# Patient Record
Sex: Female | Born: 1935
Health system: Southern US, Community
[De-identification: ages and names within clinical notes are randomized; demographics above are authoritative.]

## PROBLEM LIST (undated history)

## (undated) DIAGNOSIS — I480 Paroxysmal atrial fibrillation: Secondary | ICD-10-CM

## (undated) DIAGNOSIS — S42302A Unspecified fracture of shaft of humerus, left arm, initial encounter for closed fracture: Secondary | ICD-10-CM

## (undated) DIAGNOSIS — R609 Edema, unspecified: Secondary | ICD-10-CM

## (undated) DIAGNOSIS — E785 Hyperlipidemia, unspecified: Secondary | ICD-10-CM

## (undated) DIAGNOSIS — D1730 Benign lipomatous neoplasm of skin and subcutaneous tissue of unspecified sites: Secondary | ICD-10-CM

## (undated) DIAGNOSIS — N183 Chronic kidney disease, stage 3 unspecified: Secondary | ICD-10-CM

## (undated) DIAGNOSIS — M8580 Other specified disorders of bone density and structure, unspecified site: Secondary | ICD-10-CM

## (undated) DIAGNOSIS — C801 Malignant (primary) neoplasm, unspecified: Secondary | ICD-10-CM

## (undated) DIAGNOSIS — I4891 Unspecified atrial fibrillation: Secondary | ICD-10-CM

## (undated) DIAGNOSIS — Z9289 Personal history of other medical treatment: Secondary | ICD-10-CM

## (undated) DIAGNOSIS — E559 Vitamin D deficiency, unspecified: Secondary | ICD-10-CM

## (undated) DIAGNOSIS — I10 Essential (primary) hypertension: Secondary | ICD-10-CM

## (undated) DIAGNOSIS — I5032 Chronic diastolic (congestive) heart failure: Secondary | ICD-10-CM

## (undated) HISTORY — DX: Hyperlipidemia, unspecified: E78.5

## (undated) HISTORY — DX: Chronic diastolic (congestive) heart failure: I50.32

## (undated) HISTORY — DX: Personal history of other medical treatment: Z92.89

## (undated) HISTORY — DX: Benign lipomatous neoplasm of skin and subcutaneous tissue of unspecified sites: D17.30

## (undated) HISTORY — DX: Malignant (primary) neoplasm, unspecified: C80.1

## (undated) HISTORY — DX: Essential (primary) hypertension: I10

## (undated) HISTORY — DX: Edema, unspecified: R60.9

## (undated) HISTORY — DX: Unspecified atrial fibrillation: I48.91

## (undated) HISTORY — DX: Paroxysmal atrial fibrillation: I48.0

## (undated) HISTORY — DX: Chronic kidney disease, stage 3 unspecified: N18.30

## (undated) HISTORY — DX: Other specified disorders of bone density and structure, unspecified site: M85.80

## (undated) HISTORY — DX: Unspecified fracture of shaft of humerus, left arm, initial encounter for closed fracture: S42.302A

---
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
## 2000-06-12 ENCOUNTER — Encounter: Admission: RE | Admit: 2000-06-12 | Discharge: 2000-06-12 | Payer: Self-pay | Admitting: Internal Medicine

## 2000-06-12 ENCOUNTER — Encounter: Payer: Self-pay | Admitting: Internal Medicine

## 2001-02-14 ENCOUNTER — Encounter: Payer: Self-pay | Admitting: Internal Medicine

## 2001-02-14 ENCOUNTER — Encounter: Admission: RE | Admit: 2001-02-14 | Discharge: 2001-02-14 | Payer: Self-pay | Admitting: Internal Medicine

## 2001-07-24 ENCOUNTER — Encounter: Payer: Self-pay | Admitting: Internal Medicine

## 2001-07-24 ENCOUNTER — Encounter: Admission: RE | Admit: 2001-07-24 | Discharge: 2001-07-24 | Payer: Self-pay | Admitting: Internal Medicine

## 2002-07-15 ENCOUNTER — Encounter: Admission: RE | Admit: 2002-07-15 | Discharge: 2002-07-15 | Payer: Self-pay | Admitting: Internal Medicine

## 2002-07-15 ENCOUNTER — Encounter: Payer: Self-pay | Admitting: Internal Medicine

## 2003-08-06 ENCOUNTER — Encounter: Admission: RE | Admit: 2003-08-06 | Discharge: 2003-08-06 | Payer: Self-pay | Admitting: Internal Medicine

## 2003-08-06 ENCOUNTER — Encounter: Payer: Self-pay | Admitting: Internal Medicine

## 2003-08-19 ENCOUNTER — Other Ambulatory Visit: Admission: RE | Admit: 2003-08-19 | Discharge: 2003-08-19 | Payer: Self-pay | Admitting: Internal Medicine

## 2004-09-13 ENCOUNTER — Encounter: Admission: RE | Admit: 2004-09-13 | Discharge: 2004-09-13 | Payer: Self-pay | Admitting: Internal Medicine

## 2004-11-09 ENCOUNTER — Ambulatory Visit (HOSPITAL_COMMUNITY): Admission: RE | Admit: 2004-11-09 | Discharge: 2004-11-09 | Payer: Self-pay | Admitting: Gastroenterology

## 2005-09-14 ENCOUNTER — Encounter: Admission: RE | Admit: 2005-09-14 | Discharge: 2005-09-14 | Payer: Self-pay | Admitting: Internal Medicine

## 2006-08-22 ENCOUNTER — Other Ambulatory Visit: Admission: RE | Admit: 2006-08-22 | Discharge: 2006-08-22 | Payer: Self-pay | Admitting: Internal Medicine

## 2006-09-19 ENCOUNTER — Encounter: Admission: RE | Admit: 2006-09-19 | Discharge: 2006-09-19 | Payer: Self-pay | Admitting: Internal Medicine

## 2006-12-12 HISTORY — PX: COLONOSCOPY: SHX174

## 2007-09-13 ENCOUNTER — Other Ambulatory Visit: Admission: RE | Admit: 2007-09-13 | Discharge: 2007-09-13 | Payer: Self-pay | Admitting: Internal Medicine

## 2007-10-02 ENCOUNTER — Encounter: Admission: RE | Admit: 2007-10-02 | Discharge: 2007-10-02 | Payer: Self-pay | Admitting: Internal Medicine

## 2008-10-02 ENCOUNTER — Encounter: Admission: RE | Admit: 2008-10-02 | Discharge: 2008-10-02 | Payer: Self-pay | Admitting: Internal Medicine

## 2009-06-29 ENCOUNTER — Emergency Department (HOSPITAL_COMMUNITY): Admission: EM | Admit: 2009-06-29 | Discharge: 2009-06-29 | Payer: Self-pay | Admitting: Emergency Medicine

## 2009-12-29 ENCOUNTER — Encounter: Admission: RE | Admit: 2009-12-29 | Discharge: 2009-12-29 | Payer: Self-pay | Admitting: Internal Medicine

## 2011-01-26 ENCOUNTER — Other Ambulatory Visit: Payer: Self-pay | Admitting: Internal Medicine

## 2011-01-26 DIAGNOSIS — Z1231 Encounter for screening mammogram for malignant neoplasm of breast: Secondary | ICD-10-CM

## 2011-02-03 ENCOUNTER — Ambulatory Visit
Admission: RE | Admit: 2011-02-03 | Discharge: 2011-02-03 | Disposition: A | Discharge status: Home / Self Care | Payer: Medicare Other | Source: Ambulatory Visit | Attending: Internal Medicine | Admitting: Internal Medicine

## 2011-02-03 DIAGNOSIS — Z1231 Encounter for screening mammogram for malignant neoplasm of breast: Secondary | ICD-10-CM

## 2012-03-15 ENCOUNTER — Other Ambulatory Visit: Payer: Self-pay | Admitting: Internal Medicine

## 2012-03-15 DIAGNOSIS — Z1231 Encounter for screening mammogram for malignant neoplasm of breast: Secondary | ICD-10-CM

## 2012-03-30 DIAGNOSIS — I10 Essential (primary) hypertension: Secondary | ICD-10-CM | POA: Diagnosis not present

## 2012-03-30 DIAGNOSIS — E782 Mixed hyperlipidemia: Secondary | ICD-10-CM | POA: Diagnosis not present

## 2012-03-30 DIAGNOSIS — B351 Tinea unguium: Secondary | ICD-10-CM | POA: Diagnosis not present

## 2012-04-12 ENCOUNTER — Ambulatory Visit
Admission: RE | Admit: 2012-04-12 | Discharge: 2012-04-12 | Disposition: A | Discharge status: Home / Self Care | Payer: Medicare Other | Source: Ambulatory Visit | Attending: Internal Medicine | Admitting: Internal Medicine

## 2012-04-12 DIAGNOSIS — Z1231 Encounter for screening mammogram for malignant neoplasm of breast: Secondary | ICD-10-CM

## 2012-08-02 DIAGNOSIS — D239 Other benign neoplasm of skin, unspecified: Secondary | ICD-10-CM | POA: Diagnosis not present

## 2012-08-02 DIAGNOSIS — L821 Other seborrheic keratosis: Secondary | ICD-10-CM | POA: Diagnosis not present

## 2012-08-02 DIAGNOSIS — D234 Other benign neoplasm of skin of scalp and neck: Secondary | ICD-10-CM | POA: Diagnosis not present

## 2012-08-02 DIAGNOSIS — D485 Neoplasm of uncertain behavior of skin: Secondary | ICD-10-CM | POA: Diagnosis not present

## 2012-10-08 ENCOUNTER — Other Ambulatory Visit (HOSPITAL_COMMUNITY)
Admission: RE | Admit: 2012-10-08 | Discharge: 2012-10-08 | Disposition: A | Discharge status: Home / Self Care | Payer: Medicare Other | Source: Ambulatory Visit | Attending: Geriatric Medicine | Admitting: Geriatric Medicine

## 2012-10-08 DIAGNOSIS — Z124 Encounter for screening for malignant neoplasm of cervix: Secondary | ICD-10-CM | POA: Insufficient documentation

## 2012-10-08 DIAGNOSIS — N926 Irregular menstruation, unspecified: Secondary | ICD-10-CM | POA: Diagnosis not present

## 2012-10-08 DIAGNOSIS — N939 Abnormal uterine and vaginal bleeding, unspecified: Secondary | ICD-10-CM | POA: Diagnosis not present

## 2012-10-08 DIAGNOSIS — Z1151 Encounter for screening for human papillomavirus (HPV): Secondary | ICD-10-CM | POA: Insufficient documentation

## 2012-10-08 DIAGNOSIS — Z23 Encounter for immunization: Secondary | ICD-10-CM | POA: Diagnosis not present

## 2012-10-11 DIAGNOSIS — N950 Postmenopausal bleeding: Secondary | ICD-10-CM | POA: Diagnosis not present

## 2012-10-11 DIAGNOSIS — E782 Mixed hyperlipidemia: Secondary | ICD-10-CM | POA: Diagnosis not present

## 2012-10-11 DIAGNOSIS — I10 Essential (primary) hypertension: Secondary | ICD-10-CM | POA: Diagnosis not present

## 2012-10-11 DIAGNOSIS — Z0000 Encounter for general adult medical examination without abnormal findings: Secondary | ICD-10-CM | POA: Diagnosis not present

## 2012-10-15 ENCOUNTER — Other Ambulatory Visit: Payer: Self-pay | Admitting: Obstetrics and Gynecology

## 2012-10-15 DIAGNOSIS — N950 Postmenopausal bleeding: Secondary | ICD-10-CM

## 2012-10-16 ENCOUNTER — Ambulatory Visit
Admission: RE | Admit: 2012-10-16 | Discharge: 2012-10-16 | Disposition: A | Discharge status: Home / Self Care | Payer: Medicare Other | Source: Ambulatory Visit | Attending: Obstetrics and Gynecology | Admitting: Obstetrics and Gynecology

## 2012-10-16 DIAGNOSIS — N950 Postmenopausal bleeding: Secondary | ICD-10-CM

## 2012-10-16 DIAGNOSIS — N9489 Other specified conditions associated with female genital organs and menstrual cycle: Secondary | ICD-10-CM | POA: Diagnosis not present

## 2012-10-17 DIAGNOSIS — N950 Postmenopausal bleeding: Secondary | ICD-10-CM | POA: Diagnosis not present

## 2012-10-17 DIAGNOSIS — C549 Malignant neoplasm of corpus uteri, unspecified: Secondary | ICD-10-CM | POA: Diagnosis not present

## 2012-10-29 DIAGNOSIS — C549 Malignant neoplasm of corpus uteri, unspecified: Secondary | ICD-10-CM | POA: Diagnosis not present

## 2012-10-29 DIAGNOSIS — I10 Essential (primary) hypertension: Secondary | ICD-10-CM | POA: Diagnosis not present

## 2012-10-29 DIAGNOSIS — M948X9 Other specified disorders of cartilage, unspecified sites: Secondary | ICD-10-CM | POA: Diagnosis not present

## 2012-10-29 DIAGNOSIS — E785 Hyperlipidemia, unspecified: Secondary | ICD-10-CM | POA: Diagnosis not present

## 2012-10-29 DIAGNOSIS — D179 Benign lipomatous neoplasm, unspecified: Secondary | ICD-10-CM | POA: Diagnosis not present

## 2012-11-02 DIAGNOSIS — I10 Essential (primary) hypertension: Secondary | ICD-10-CM | POA: Diagnosis not present

## 2012-11-02 DIAGNOSIS — C549 Malignant neoplasm of corpus uteri, unspecified: Secondary | ICD-10-CM | POA: Diagnosis not present

## 2012-11-02 DIAGNOSIS — Z01818 Encounter for other preprocedural examination: Secondary | ICD-10-CM | POA: Diagnosis not present

## 2012-11-02 DIAGNOSIS — M949 Disorder of cartilage, unspecified: Secondary | ICD-10-CM | POA: Diagnosis not present

## 2012-11-02 DIAGNOSIS — E785 Hyperlipidemia, unspecified: Secondary | ICD-10-CM | POA: Diagnosis not present

## 2012-11-02 DIAGNOSIS — Z880 Allergy status to penicillin: Secondary | ICD-10-CM | POA: Diagnosis not present

## 2012-11-02 DIAGNOSIS — Z79899 Other long term (current) drug therapy: Secondary | ICD-10-CM | POA: Diagnosis not present

## 2012-11-02 DIAGNOSIS — M899 Disorder of bone, unspecified: Secondary | ICD-10-CM | POA: Diagnosis not present

## 2012-11-02 DIAGNOSIS — Z7982 Long term (current) use of aspirin: Secondary | ICD-10-CM | POA: Diagnosis not present

## 2012-11-06 DIAGNOSIS — C549 Malignant neoplasm of corpus uteri, unspecified: Secondary | ICD-10-CM | POA: Diagnosis not present

## 2012-11-06 DIAGNOSIS — Z09 Encounter for follow-up examination after completed treatment for conditions other than malignant neoplasm: Secondary | ICD-10-CM | POA: Diagnosis not present

## 2012-11-06 DIAGNOSIS — Z7982 Long term (current) use of aspirin: Secondary | ICD-10-CM | POA: Diagnosis not present

## 2012-11-06 DIAGNOSIS — D251 Intramural leiomyoma of uterus: Secondary | ICD-10-CM | POA: Diagnosis not present

## 2012-11-06 DIAGNOSIS — M949 Disorder of cartilage, unspecified: Secondary | ICD-10-CM | POA: Diagnosis not present

## 2012-11-06 DIAGNOSIS — M899 Disorder of bone, unspecified: Secondary | ICD-10-CM | POA: Diagnosis not present

## 2012-11-06 DIAGNOSIS — Z79899 Other long term (current) drug therapy: Secondary | ICD-10-CM | POA: Diagnosis not present

## 2012-11-06 DIAGNOSIS — I10 Essential (primary) hypertension: Secondary | ICD-10-CM | POA: Diagnosis not present

## 2012-11-06 DIAGNOSIS — E785 Hyperlipidemia, unspecified: Secondary | ICD-10-CM | POA: Diagnosis not present

## 2012-11-06 HISTORY — PX: ROBOTIC ASSISTED SUPRACERVICAL HYSTERECTOMY WITH BILATERAL SALPINGO OOPHERECTOMY: SHX6084

## 2012-11-07 DIAGNOSIS — I10 Essential (primary) hypertension: Secondary | ICD-10-CM | POA: Diagnosis not present

## 2012-11-07 DIAGNOSIS — Z79899 Other long term (current) drug therapy: Secondary | ICD-10-CM | POA: Diagnosis not present

## 2012-11-07 DIAGNOSIS — M899 Disorder of bone, unspecified: Secondary | ICD-10-CM | POA: Diagnosis not present

## 2012-11-07 DIAGNOSIS — Z7982 Long term (current) use of aspirin: Secondary | ICD-10-CM | POA: Diagnosis not present

## 2012-11-07 DIAGNOSIS — E785 Hyperlipidemia, unspecified: Secondary | ICD-10-CM | POA: Diagnosis not present

## 2012-11-07 DIAGNOSIS — C549 Malignant neoplasm of corpus uteri, unspecified: Secondary | ICD-10-CM | POA: Diagnosis not present

## 2012-12-26 ENCOUNTER — Ambulatory Visit: Payer: Medicare Other | Admitting: Radiation Oncology

## 2012-12-26 ENCOUNTER — Ambulatory Visit: Payer: Medicare Other

## 2012-12-28 ENCOUNTER — Encounter: Payer: Self-pay | Admitting: Radiation Oncology

## 2012-12-28 DIAGNOSIS — C541 Malignant neoplasm of endometrium: Secondary | ICD-10-CM | POA: Insufficient documentation

## 2012-12-28 NOTE — Progress Notes (Signed)
79 year old female. Married. Resident of [place].   S/P robotic hysterectomy with bilateral salpingo-oophorectomy and bilateral lymph dissection on 11/06/2012. DX with grade 1 endometrioid adenocarcinoma of he uterus. 19 lymph nodes negative. Referred by Dr. Polly Cobia for adjuvant vaginal cuff brachytherapy.   AX: penicillin v potassium No indication of a pacemaker No hx of radiation therapy

## 2012-12-31 ENCOUNTER — Encounter: Payer: Self-pay | Admitting: Radiation Oncology

## 2012-12-31 ENCOUNTER — Ambulatory Visit
Admission: RE | Admit: 2012-12-31 | Discharge: 2012-12-31 | Disposition: A | Discharge status: Home / Self Care | Payer: Medicare Other | Source: Ambulatory Visit | Attending: Radiation Oncology | Admitting: Radiation Oncology

## 2012-12-31 VITALS — BP 141/62 | HR 65 | Temp 97.0°F | Resp 16 | Ht 66.0 in | Wt 165.6 lb

## 2012-12-31 DIAGNOSIS — C541 Malignant neoplasm of endometrium: Secondary | ICD-10-CM

## 2012-12-31 DIAGNOSIS — C549 Malignant neoplasm of corpus uteri, unspecified: Secondary | ICD-10-CM | POA: Diagnosis not present

## 2012-12-31 NOTE — Progress Notes (Signed)
Radiation Oncology         [phone] ________________________________  Initial outpatient Consultation  Name: Christina Morgan MRN: [id number]  Date: 12/31/2012  DOB: 02-16-1933  CC:No primary provider on file.  Hart Rochester, MD   REFERRING PHYSICIAN: Hart Rochester, MD  DIAGNOSIS: The encounter diagnosis was Endometrial ca. FIGO stage I-B  HISTORY OF PRESENT ILLNESS::Christina Morgan is a 79 y.o. female who is seen out of the courtesy of Dr. Polly Cobia for an opinion concerning radiation therapy as part of management of patient's recently diagnosed endometrial cancer.   last fall the patient presented with abrupt onset of significant vaginal bleeding. She sought immediate medical attention. Patient was seen by Dr. Simona Huh. The transvaginal ultrasound was performed on 10/14/2012 which showed a thickened and heterogeneous endometrium suspicious for endometrial mass. Patient proceeded to undergo biopsy on November 6 which revealed FIGO grade 1 endometrioid adenocarcinoma. Patient was referred to Dr. Polly Cobia and on 11/06/2012 the patient was taken to the operating room at which time she underwent robotic hysterectomy with bilateral salpingo-oophorectomy and bilateral pelvic lymph node dissection. Pathology was significant for a 6 cm endometrioid carcinoma. There was deep myometrial invasion to a depth of 1.1 cm with a thickness of 1.7 cm. Patient had 19 benign pelvic lymph nodes removed.  She has done well since her surgery.  PREVIOUS RADIATION THERAPY: No  PAST MEDICAL HISTORY:  has a past medical history of Cancer; Dyslipidemia; Hypertension; Osteopenia; and Lipoma of skin.    PAST SURGICAL HISTORY: Past Surgical History  Procedure Date  . Colonoscopy 2008  . Robotic assisted supracervical hysterectomy with bilateral salpingo oopherectomy 11/06/2012    and bilateral pelvic lymph node dissection    FAMILY HISTORY: family history includes Cancer (age of onset:70) in her  father.  SOCIAL HISTORY:  reports that she has never smoked. She has never used smokeless tobacco. She reports that she does not drink alcohol or use illicit drugs.  ALLERGIES: Penicillin v potassium  MEDICATIONS:  Current Outpatient Prescriptions  Medication Sig Dispense Refill  . aspirin 81 MG tablet Take 81 mg by mouth daily.      Marland Kitchen atenolol (TENORMIN) 50 MG tablet       . atorvastatin (LIPITOR) 10 MG tablet       . chlorthalidone (HYGROTON) 25 MG tablet       . desoximetasone (TOPICORT) 0.25 % cream       . fish oil-omega-3 fatty acids 1000 MG capsule Take 2 g by mouth daily.      Marland Kitchen HYDROcodone-acetaminophen (NORCO/VICODIN) 5-325 MG per tablet       . polyethylene glycol powder (GLYCOLAX/MIRALAX) powder         REVIEW OF SYSTEMS:  A 15 point review of systems is documented in the electronic medical record. This was obtained by the nursing staff. However, I reviewed this with the patient to discuss relevant findings and make appropriate changes.  Prior to diagnosis the patient denied any pain in the pelvis area or flank pain. She denies any pain in the postoperative setting. She denies any urination difficulties or bowel complaints. She denies any vaginal bleeding.   PHYSICAL EXAM:  height is 5\' 6"  (1.676 m) and weight is 165 lb 9.6 oz (75.116 kg). Her oral temperature is 97 F (36.1 C). Her blood pressure is 141/62 and her pulse is 65. Her respiration is 16 and oxygen saturation is 100%.   BP 141/62  Pulse 65  Temp 97 F (36.1 C) (Oral)  Resp 16  Ht 5\' 6"  (1.676 m)  Wt 165 lb 9.6 oz (75.116 kg)  BMI 26.73 kg/m2  SpO2 100%  General Appearance:    Alert, cooperative, no distress, appears stated age  Head:    Normocephalic, without obvious abnormality, atraumatic     pupils are equal round reactive to light. The extraocular eye movements are intact      Nose:   Nares normal, septum midline, mucosa normal, no drainage    or sinus tenderness  Throat:   Lips, mucosa, and tongue  normal; teeth and gums normal  Neck:   Supple, symmetrical, trachea midline, no adenopathy;    thyroid:  no enlargement/tenderness/nodules; no carotid   bruit or JVD  Back:     Symmetric, no curvature, ROM normal, no CVA tenderness  Lungs:     Clear to auscultation bilaterally, respirations unlabored  Chest Wall:    No tenderness or deformity   Heart:    Regular rate and rhythm,      Abdomen:     Soft, non-tender, bowel sounds active all four quadrants,    no masses, no organomegaly, four small scars present from her laparoscopic procedure   Genitalia:    deferred to simulation today      Extremities:   Extremities normal, atraumatic, no cyanosis or edema  Pulses:   2+ and symmetric all extremities  Skin:   Skin color, texture, turgor normal, no rashes or lesions  Lymph nodes:   Cervical, supraclavicular, and axillary nodes normal, no inguinal adenopathy   Neurologic:   CNII-XII intact, normal strength, sensation and reflexes    throughout    LABORATORY DATA:  No results found for this basename: WBC, HGB, HCT, MCV, PLT   No results found for this basename: NA, K, CL, CO2   No results found for this basename: ALT, AST, GGT, ALKPHOS, BILITOT     RADIOGRAPHY: No results found.    IMPRESSION: Stage I-B grade 1 endometrioid adenocarcinoma. Patient would be at risk for vaginal cuff recurrence and I would recommend intracavitary brachytherapy treatments as part of her overall management. Given her early stage disease I do not feel she would benefit from external beam radiation therapy as part of her overall management.  PLAN: Simulation and planning the week of February 17. Patient will be examined at that time to make sure the vaginal cuff is well-healed. I spent 60 minutes minutes face to face with the patient and more than 50% of that time was spent in counseling and/or coordination of care.    ------------------------------------------------  -----------------------------------  Blair Promise, PhD, MD

## 2012-12-31 NOTE — Progress Notes (Signed)
Patient presents to the clinic today accompanied by her daughter for a consultation with Dr. Sondra Come to discuss the role of radiation therapy in the treatment of endometrioid adenocarcinoma. Patient alert and oriented to person, place, and time. No distress noted. Steady gait noted. Pleasant affect noted. Patient denies pain at this time. Both patient and daughter are very emotional. Patient was diagnosed October 18, 2012. Since then in January her husband has been diagnosed with esophageal cancer with mets to liver. They are hoping he will be discharged home today with a feeding tube. Both report that Dr. Alen Blew is his medical oncologist. Patient seen by Dr. Polly Cobia on 12/17/2012. Dr. Polly Cobia instructed the patient that she should not begin any treatment for at least six weeks because she hasn't completely healed yet. Patient is asymptomatic. Patient reports that she got up the morning of 10/08/2012 bleeding heavily went to the doctor the same day and "here she is." patient denies nausea, vomiting, headache, dizziness, diarrhea or constipation. Patient not eating or sleeping well due to outside stressors. Patient denies vaginal bleeding or discharge presently. Reported all findings to Dr. Sondra Come.

## 2012-12-31 NOTE — Progress Notes (Signed)
Patient and daughter very tearful and emotional. Patient unable to complete PATIENT MEASURE OF DISTRESS worksheet. Patient reports that her stress level is a 10. Patient diagnosed 10/18/2012 then, her husband was unexpectedly diagnosed with esophageal cancer that has metastasized to his liver. The patient's husband is still hospitalized and she has stayed every night with him. Contacted via phone Abagail Elmore, LCSW and Polo Riley, LCSW reference this patient.

## 2012-12-31 NOTE — Progress Notes (Signed)
See progress note under physician encounter. 

## 2013-01-01 ENCOUNTER — Ambulatory Visit: Payer: Self-pay | Admitting: Radiation Oncology

## 2013-01-01 ENCOUNTER — Telehealth: Payer: Self-pay | Admitting: *Deleted

## 2013-01-01 NOTE — Telephone Encounter (Signed)
CALLED PATIENT TO INFORM OF HDR VAG. CUFF CASE ON 01-29-13, SPOKE WITH PATIENT AND SHE IS AWARE OF THESE APPTS.

## 2013-01-02 NOTE — Addendum Note (Signed)
Encounter addended by: Rebecca Eaton, RN on: 01/02/2013 10:08 AM<BR>     Documentation filed: Charges VN

## 2013-01-03 ENCOUNTER — Encounter: Payer: Self-pay | Admitting: *Deleted

## 2013-01-03 NOTE — Progress Notes (Signed)
[facility] Psychosocial Distress Screening Clinical Social Work  Clinical Social Work was referred by distress screening protocol.  The patient scored a 10 on the Psychosocial Distress Thermometer which indicatessevere distress. Patient did not wish to be contacted that day, however, Clinical Social Worker contacted patient by phone to assess for distress and other psychosocial needs. Mrs. Spencer states her spouse was also recently diagnosed with cancer and they were both in the process of beginning treatment. CSW explained support services available at cancer center and CSW role.  Patient states she feels she's "doing okay" and has no needs at this time. CSW encouraged patient to contact in the future.   Clinical Social Worker follow up needed: no  If yes, follow up plan:  Polo Riley, MSW, [place] [phone]

## 2013-01-28 ENCOUNTER — Telehealth: Payer: Self-pay | Admitting: *Deleted

## 2013-01-28 NOTE — Telephone Encounter (Signed)
Mrs. Amabile called to cancel any further rad txs to her husband Christina Morgan he is in ICU but being transferred to Palliative care , please to let Dr.Moody  And staff know, also she is cancelling her appt with Dr.Kinard tomorrow and she will call when she feels she can reschedule ,will inform the MD'S and staff ,she doesn't want [place] to call her she emphasized,  7:50 AM

## 2013-01-29 ENCOUNTER — Ambulatory Visit: Payer: Medicare Other | Admitting: Radiation Oncology

## 2013-01-29 ENCOUNTER — Ambulatory Visit: Payer: Medicare Other

## 2013-02-05 ENCOUNTER — Ambulatory Visit: Payer: Medicare Other | Admitting: Radiation Oncology

## 2013-02-12 ENCOUNTER — Ambulatory Visit: Payer: Medicare Other | Admitting: Radiation Oncology

## 2013-02-19 ENCOUNTER — Ambulatory Visit: Payer: Medicare Other | Admitting: Radiation Oncology

## 2013-02-21 DIAGNOSIS — N959 Unspecified menopausal and perimenopausal disorder: Secondary | ICD-10-CM | POA: Diagnosis not present

## 2013-02-21 DIAGNOSIS — Z90711 Acquired absence of uterus with remaining cervical stump: Secondary | ICD-10-CM | POA: Diagnosis not present

## 2013-02-21 DIAGNOSIS — C549 Malignant neoplasm of corpus uteri, unspecified: Secondary | ICD-10-CM | POA: Diagnosis not present

## 2013-02-21 DIAGNOSIS — M1990 Unspecified osteoarthritis, unspecified site: Secondary | ICD-10-CM | POA: Diagnosis not present

## 2013-02-21 DIAGNOSIS — C55 Malignant neoplasm of uterus, part unspecified: Secondary | ICD-10-CM | POA: Diagnosis not present

## 2013-02-21 DIAGNOSIS — I10 Essential (primary) hypertension: Secondary | ICD-10-CM | POA: Diagnosis not present

## 2013-02-26 ENCOUNTER — Ambulatory Visit: Payer: Medicare Other | Admitting: Radiation Oncology

## 2013-02-27 ENCOUNTER — Telehealth: Payer: Self-pay | Admitting: Radiation Oncology

## 2013-02-27 NOTE — Telephone Encounter (Signed)
Per Dr. Clabe Seal request called patient to inquire about her status and if she would like to reschedule. No answer. Left message on voicemail requesting return call. Routed message to Dr. Sondra Come.

## 2013-03-20 DIAGNOSIS — C549 Malignant neoplasm of corpus uteri, unspecified: Secondary | ICD-10-CM | POA: Diagnosis not present

## 2013-03-21 DIAGNOSIS — I10 Essential (primary) hypertension: Secondary | ICD-10-CM | POA: Diagnosis not present

## 2013-03-21 DIAGNOSIS — C55 Malignant neoplasm of uterus, part unspecified: Secondary | ICD-10-CM | POA: Diagnosis not present

## 2013-03-21 DIAGNOSIS — N959 Unspecified menopausal and perimenopausal disorder: Secondary | ICD-10-CM | POA: Diagnosis not present

## 2013-03-21 DIAGNOSIS — C549 Malignant neoplasm of corpus uteri, unspecified: Secondary | ICD-10-CM | POA: Diagnosis not present

## 2013-03-21 DIAGNOSIS — M1990 Unspecified osteoarthritis, unspecified site: Secondary | ICD-10-CM | POA: Diagnosis not present

## 2013-03-28 DIAGNOSIS — Z510 Encounter for antineoplastic radiation therapy: Secondary | ICD-10-CM | POA: Diagnosis not present

## 2013-03-28 DIAGNOSIS — C549 Malignant neoplasm of corpus uteri, unspecified: Secondary | ICD-10-CM | POA: Diagnosis not present

## 2013-04-04 DIAGNOSIS — M7989 Other specified soft tissue disorders: Secondary | ICD-10-CM | POA: Diagnosis not present

## 2013-04-04 DIAGNOSIS — R609 Edema, unspecified: Secondary | ICD-10-CM | POA: Diagnosis not present

## 2013-04-04 DIAGNOSIS — C549 Malignant neoplasm of corpus uteri, unspecified: Secondary | ICD-10-CM | POA: Diagnosis not present

## 2013-04-11 DIAGNOSIS — C549 Malignant neoplasm of corpus uteri, unspecified: Secondary | ICD-10-CM | POA: Diagnosis not present

## 2013-04-11 DIAGNOSIS — Z510 Encounter for antineoplastic radiation therapy: Secondary | ICD-10-CM | POA: Diagnosis not present

## 2013-04-12 DIAGNOSIS — Z510 Encounter for antineoplastic radiation therapy: Secondary | ICD-10-CM | POA: Diagnosis not present

## 2013-04-12 DIAGNOSIS — C549 Malignant neoplasm of corpus uteri, unspecified: Secondary | ICD-10-CM | POA: Diagnosis not present

## 2013-05-13 ENCOUNTER — Other Ambulatory Visit: Payer: Self-pay

## 2013-05-13 DIAGNOSIS — Z1231 Encounter for screening mammogram for malignant neoplasm of breast: Secondary | ICD-10-CM

## 2013-05-14 DIAGNOSIS — D1739 Benign lipomatous neoplasm of skin and subcutaneous tissue of other sites: Secondary | ICD-10-CM | POA: Diagnosis not present

## 2013-05-14 DIAGNOSIS — E785 Hyperlipidemia, unspecified: Secondary | ICD-10-CM | POA: Diagnosis not present

## 2013-05-14 DIAGNOSIS — I890 Lymphedema, not elsewhere classified: Secondary | ICD-10-CM | POA: Diagnosis not present

## 2013-05-14 DIAGNOSIS — M949 Disorder of cartilage, unspecified: Secondary | ICD-10-CM | POA: Diagnosis not present

## 2013-05-14 DIAGNOSIS — Z800 Family history of malignant neoplasm of digestive organs: Secondary | ICD-10-CM | POA: Diagnosis not present

## 2013-05-14 DIAGNOSIS — I10 Essential (primary) hypertension: Secondary | ICD-10-CM | POA: Diagnosis not present

## 2013-05-14 DIAGNOSIS — M899 Disorder of bone, unspecified: Secondary | ICD-10-CM | POA: Diagnosis not present

## 2013-05-14 DIAGNOSIS — C549 Malignant neoplasm of corpus uteri, unspecified: Secondary | ICD-10-CM | POA: Diagnosis not present

## 2013-05-14 DIAGNOSIS — Z79899 Other long term (current) drug therapy: Secondary | ICD-10-CM | POA: Diagnosis not present

## 2013-05-14 DIAGNOSIS — F4321 Adjustment disorder with depressed mood: Secondary | ICD-10-CM | POA: Diagnosis not present

## 2013-05-15 DIAGNOSIS — H2510 Age-related nuclear cataract, unspecified eye: Secondary | ICD-10-CM | POA: Diagnosis not present

## 2013-06-11 ENCOUNTER — Ambulatory Visit
Admission: RE | Admit: 2013-06-11 | Discharge: 2013-06-11 | Disposition: A | Discharge status: Home / Self Care | Payer: Medicare Other | Source: Ambulatory Visit

## 2013-06-11 DIAGNOSIS — Z1231 Encounter for screening mammogram for malignant neoplasm of breast: Secondary | ICD-10-CM | POA: Diagnosis not present

## 2013-06-13 ENCOUNTER — Other Ambulatory Visit: Payer: Self-pay | Admitting: Internal Medicine

## 2013-06-13 DIAGNOSIS — R928 Other abnormal and inconclusive findings on diagnostic imaging of breast: Secondary | ICD-10-CM

## 2013-06-26 ENCOUNTER — Ambulatory Visit
Admission: RE | Admit: 2013-06-26 | Discharge: 2013-06-26 | Disposition: A | Discharge status: Home / Self Care | Payer: Medicare Other | Source: Ambulatory Visit | Attending: Internal Medicine | Admitting: Internal Medicine

## 2013-06-26 DIAGNOSIS — R928 Other abnormal and inconclusive findings on diagnostic imaging of breast: Secondary | ICD-10-CM

## 2013-06-26 DIAGNOSIS — N6019 Diffuse cystic mastopathy of unspecified breast: Secondary | ICD-10-CM | POA: Diagnosis not present

## 2013-08-06 DIAGNOSIS — L280 Lichen simplex chronicus: Secondary | ICD-10-CM | POA: Diagnosis not present

## 2013-08-06 DIAGNOSIS — D239 Other benign neoplasm of skin, unspecified: Secondary | ICD-10-CM | POA: Diagnosis not present

## 2013-08-06 DIAGNOSIS — L821 Other seborrheic keratosis: Secondary | ICD-10-CM | POA: Diagnosis not present

## 2013-08-20 DIAGNOSIS — I10 Essential (primary) hypertension: Secondary | ICD-10-CM | POA: Diagnosis not present

## 2013-08-20 DIAGNOSIS — C549 Malignant neoplasm of corpus uteri, unspecified: Secondary | ICD-10-CM | POA: Diagnosis not present

## 2013-08-20 DIAGNOSIS — Z923 Personal history of irradiation: Secondary | ICD-10-CM | POA: Diagnosis not present

## 2013-08-20 DIAGNOSIS — E785 Hyperlipidemia, unspecified: Secondary | ICD-10-CM | POA: Diagnosis not present

## 2013-08-20 DIAGNOSIS — M899 Disorder of bone, unspecified: Secondary | ICD-10-CM | POA: Diagnosis not present

## 2013-08-20 DIAGNOSIS — Z7982 Long term (current) use of aspirin: Secondary | ICD-10-CM | POA: Diagnosis not present

## 2013-08-20 DIAGNOSIS — Z90710 Acquired absence of both cervix and uterus: Secondary | ICD-10-CM | POA: Diagnosis not present

## 2013-08-20 DIAGNOSIS — Z9079 Acquired absence of other genital organ(s): Secondary | ICD-10-CM | POA: Diagnosis not present

## 2013-08-20 DIAGNOSIS — Z79899 Other long term (current) drug therapy: Secondary | ICD-10-CM | POA: Diagnosis not present

## 2013-10-18 DIAGNOSIS — I10 Essential (primary) hypertension: Secondary | ICD-10-CM | POA: Diagnosis not present

## 2013-10-18 DIAGNOSIS — C55 Malignant neoplasm of uterus, part unspecified: Secondary | ICD-10-CM | POA: Diagnosis not present

## 2013-10-18 DIAGNOSIS — Z0000 Encounter for general adult medical examination without abnormal findings: Secondary | ICD-10-CM | POA: Diagnosis not present

## 2013-10-18 DIAGNOSIS — E782 Mixed hyperlipidemia: Secondary | ICD-10-CM | POA: Diagnosis not present

## 2013-10-18 DIAGNOSIS — Z23 Encounter for immunization: Secondary | ICD-10-CM | POA: Diagnosis not present

## 2013-11-20 DIAGNOSIS — C549 Malignant neoplasm of corpus uteri, unspecified: Secondary | ICD-10-CM | POA: Diagnosis not present

## 2013-11-20 DIAGNOSIS — R059 Cough, unspecified: Secondary | ICD-10-CM | POA: Diagnosis not present

## 2013-11-20 DIAGNOSIS — C55 Malignant neoplasm of uterus, part unspecified: Secondary | ICD-10-CM | POA: Diagnosis not present

## 2013-11-20 DIAGNOSIS — R05 Cough: Secondary | ICD-10-CM | POA: Diagnosis not present

## 2014-02-19 DIAGNOSIS — M949 Disorder of cartilage, unspecified: Secondary | ICD-10-CM | POA: Diagnosis not present

## 2014-02-19 DIAGNOSIS — I10 Essential (primary) hypertension: Secondary | ICD-10-CM | POA: Diagnosis not present

## 2014-02-19 DIAGNOSIS — M899 Disorder of bone, unspecified: Secondary | ICD-10-CM | POA: Diagnosis not present

## 2014-02-19 DIAGNOSIS — Z79899 Other long term (current) drug therapy: Secondary | ICD-10-CM | POA: Diagnosis not present

## 2014-02-19 DIAGNOSIS — Z780 Asymptomatic menopausal state: Secondary | ICD-10-CM | POA: Diagnosis not present

## 2014-02-19 DIAGNOSIS — C549 Malignant neoplasm of corpus uteri, unspecified: Secondary | ICD-10-CM | POA: Diagnosis not present

## 2014-02-19 DIAGNOSIS — Z9079 Acquired absence of other genital organ(s): Secondary | ICD-10-CM | POA: Diagnosis not present

## 2014-02-19 DIAGNOSIS — Z7982 Long term (current) use of aspirin: Secondary | ICD-10-CM | POA: Diagnosis not present

## 2014-02-19 DIAGNOSIS — E785 Hyperlipidemia, unspecified: Secondary | ICD-10-CM | POA: Diagnosis not present

## 2014-02-19 DIAGNOSIS — Z880 Allergy status to penicillin: Secondary | ICD-10-CM | POA: Diagnosis not present

## 2014-02-19 DIAGNOSIS — Z923 Personal history of irradiation: Secondary | ICD-10-CM | POA: Diagnosis not present

## 2014-02-19 DIAGNOSIS — I890 Lymphedema, not elsewhere classified: Secondary | ICD-10-CM | POA: Diagnosis not present

## 2014-02-19 DIAGNOSIS — Z90710 Acquired absence of both cervix and uterus: Secondary | ICD-10-CM | POA: Diagnosis not present

## 2014-04-18 DIAGNOSIS — I499 Cardiac arrhythmia, unspecified: Secondary | ICD-10-CM | POA: Diagnosis not present

## 2014-04-18 DIAGNOSIS — I10 Essential (primary) hypertension: Secondary | ICD-10-CM | POA: Diagnosis not present

## 2014-04-18 DIAGNOSIS — I4891 Unspecified atrial fibrillation: Secondary | ICD-10-CM | POA: Diagnosis not present

## 2014-04-18 DIAGNOSIS — E782 Mixed hyperlipidemia: Secondary | ICD-10-CM | POA: Diagnosis not present

## 2014-05-02 ENCOUNTER — Other Ambulatory Visit (HOSPITAL_COMMUNITY): Payer: Self-pay | Admitting: Radiology

## 2014-05-02 ENCOUNTER — Ambulatory Visit (HOSPITAL_COMMUNITY): Payer: Medicare Other | Attending: Cardiology | Admitting: Radiology

## 2014-05-02 DIAGNOSIS — I4892 Unspecified atrial flutter: Secondary | ICD-10-CM

## 2014-05-02 DIAGNOSIS — I4891 Unspecified atrial fibrillation: Secondary | ICD-10-CM | POA: Insufficient documentation

## 2014-05-02 NOTE — Progress Notes (Signed)
Echocardiogram performed.  

## 2014-05-14 ENCOUNTER — Ambulatory Visit: Payer: Medicare Other | Admitting: Cardiology

## 2014-05-15 ENCOUNTER — Other Ambulatory Visit: Payer: Self-pay | Admitting: Cardiology

## 2014-05-15 ENCOUNTER — Encounter: Payer: Self-pay | Admitting: Cardiology

## 2014-05-15 ENCOUNTER — Ambulatory Visit (INDEPENDENT_AMBULATORY_CARE_PROVIDER_SITE_OTHER): Payer: Medicare Other | Admitting: Cardiology

## 2014-05-15 VITALS — BP 112/64 | HR 101 | Ht 67.0 in | Wt 164.8 lb

## 2014-05-15 DIAGNOSIS — E782 Mixed hyperlipidemia: Secondary | ICD-10-CM

## 2014-05-15 DIAGNOSIS — I4891 Unspecified atrial fibrillation: Secondary | ICD-10-CM

## 2014-05-15 DIAGNOSIS — I10 Essential (primary) hypertension: Secondary | ICD-10-CM

## 2014-05-15 DIAGNOSIS — I4819 Other persistent atrial fibrillation: Secondary | ICD-10-CM | POA: Insufficient documentation

## 2014-05-15 DIAGNOSIS — E785 Hyperlipidemia, unspecified: Secondary | ICD-10-CM | POA: Insufficient documentation

## 2014-05-15 NOTE — H&P (Signed)
Patient ID: Christina Morgan, female   DOB: [birth year], 80 y.o.   MRN: [id number]  [facility], [address] [place], [place]  [address] Phone: [phone] Fax:  [phone]   Date:  05/15/2014    ID:  Christina Morgan, Christina Morgan 1933-11-01, MRN [id number]   PCP:  No primary provider on file.       Cardiologist:  Fransico Him, MD              History of Present Illness: Christina Morgan is a 80 y.o. female presenting for new patient visit for afib recently diagnosed at PCP visit.   Went for wellness visit and heart was noted to be fast. Was a little fatigued and has been fatigued since 2013. Notes some dizziness for a couple of months. When walking would stumble. No room spinning. It would occur if got up too quick or bent over. Got better on its own.  No CP, palpitations, dyspnea, no reported snoring or issues sleeping. Has been on eliquis since 04/18/14 and tolerating well.    History of HTN and HLD. Under control at this time. No history of MI. No history of arrhythmia.    No tobacco. No alcohol. No illicits.    Mother with MI and died at 53.       Wt Readings from Last 3 Encounters:   12/31/12  165 lb 9.6 oz (75.116 kg)         Past Medical History   Diagnosis  Date   .  Cancer         endometrial ca   .  Dyslipidemia     .  Hypertension     .  Osteopenia     .  Lipoma of skin         multiple   .  Hyperlipidemia     .  Fracture of left humerus           Current Outpatient Prescriptions   Medication  Sig  Dispense  Refill   .  atenolol (TENORMIN) 50 MG tablet           .  atorvastatin (LIPITOR) 10 MG tablet           .  chlorthalidone (HYGROTON) 25 MG tablet           .  fish oil-omega-3 fatty acids 1000 MG capsule  Take 2 g by mouth daily.         .  polyethylene glycol powder (GLYCOLAX/MIRALAX) powder               No current facility-administered medications for this visit.        Allergies:    Allergies   Allergen  Reactions   .   Penicillin V Potassium         dizziness        Social History:  The patient  reports that she has never smoked. She has never used smokeless tobacco. She reports that she does not drink alcohol or use illicit drugs.    Family History:  The patient's family history includes Cancer (age of onset: 61) in her father.    ROS:  Please see the history of present illness.   All other systems reviewed and negative.     PHYSICAL EXAM: VS:  BP 112/64  Pulse 101  Ht 5\' 7"  (1.702 m)  Wt 164 lb 12.8 oz (74.753 kg)  BMI 25.81 kg/m2 Well nourished, well developed,  in no acute distress  HEENT: normal  Neck: no JVD  Cardiac:  normal S1, S2; irregularly irregular; no murmur  Lungs:  clear to auscultation bilaterally, no wheezing, rhonchi or rales  Abd: soft, nontender, no hepatomegaly  Ext: no edema  Skin: warm and dry  Neuro:  CNs 2-12 intact, no focal abnormalities noted   EKG: from 04/18/14, afib, rate 91       ASSESSMENT AND PLAN:    Atrial fibrillation- found on exam and evidenced on EKG from 04/18/14. Patient without symptoms at this time. Given that patient is still in afib and has been on eliquis for almost 4 weeks, will schedule for DC cardioversion in the next 1-2 weeks. Risks and benefits of the procedure discussed with patient. Risks include anesthesia risks, need for temporary transcutanous pacing or transvenous pacing. Given patients blood pressure will continue current dosing of her atenolol. She is to continue the eliquis until her cardioversion and indefinitely after that given her CHADS VASC score of 4 (female-1, age>65-2, HTN-1) HTN- continue current dosing of atenolol and chlorthalidone.     Signed, Tommi Rumps, MD 05/15/2014 3:46 PM    Patient's chart reviewed and patient seen, interviewed and examined and agree with note above as outlined by the Resident with minor changes.  Found recently on PE to be in new onset atrial fibrillation of unknown duration and fairly  asymptomatic except for some mild occasional dizziness and fatiguge.  HR fairly well controlled.  She has been on Eliquis for almost 4 weeks.  She is going to the beach for a week and so will plan DCCV when she gets back.  I will get a 2D echo to assess LVF and LA size.  Signed:  Fransico Him, MD 05/15/2014

## 2014-05-15 NOTE — Patient Instructions (Signed)
Your physician recommends that you return for lab work on 05/28/14 for bmet, cbc with diff and PT/INR.  Your physician has recommended that you have a Cardioversion (DCCV). Electrical Cardioversion uses a jolt of electricity to your heart either through paddles or wired patches attached to your chest. This is a controlled, usually prescheduled, procedure. Defibrillation is done under light anesthesia in the hospital, and you usually go home the day of the procedure. This is done to get your heart back into a normal rhythm. You are not awake for the procedure. Please see the instruction sheet given to you today.

## 2014-05-15 NOTE — Progress Notes (Addendum)
Patient ID: Christina Morgan, female   DOB: 07-16-1933, 80 y.o.   MRN: [id number]  [facility], [address] [place], [place]  [address] Phone: [phone] Fax:  [phone]  Date:  05/15/2014   ID:  Christina, Morgan 1933-03-12, MRN [id number]  PCP:  No primary provider on file.  Cardiologist:  Fransico Him, MD     History of Present Illness: Christina Morgan is a 80 y.o. female presenting for new patient visit for afib recently diagnosed at PCP visit.  Went for wellness visit and heart was noted to be fast. Was a little fatigued and has been fatigued since 2013. Notes some dizziness for a couple of months. When walking would stumble. No room spinning. It would occur if got up too quick or bent over. Got better on its own.  No CP, palpitations, dyspnea, no reported snoring or issues sleeping. Has been on eliquis since 04/18/14 and tolerating well.   History of HTN and HLD. Under control at this time. No history of MI. No history of arrhythmia.   No tobacco. No alcohol. No illicits.   Mother with MI and died at 6.    Wt Readings from Last 3 Encounters:  12/31/12 165 lb 9.6 oz (75.116 kg)     Past Medical History  Diagnosis Date  . Cancer     endometrial ca  . Dyslipidemia   . Hypertension   . Osteopenia   . Lipoma of skin     multiple  . Hyperlipidemia   . Fracture of left humerus     Current Outpatient Prescriptions  Medication Sig Dispense Refill  . atenolol (TENORMIN) 50 MG tablet       . atorvastatin (LIPITOR) 10 MG tablet       . chlorthalidone (HYGROTON) 25 MG tablet       . fish oil-omega-3 fatty acids 1000 MG capsule Take 2 g by mouth daily.      . polyethylene glycol powder (GLYCOLAX/MIRALAX) powder        No current facility-administered medications for this visit.    Allergies:    Allergies  Allergen Reactions  . Penicillin V Potassium     dizziness    Social History:  The patient  reports that she has never smoked. She has never used  smokeless tobacco. She reports that she does not drink alcohol or use illicit drugs.   Family History:  The patient's family history includes Cancer (age of onset: 64) in her father.   ROS:  Please see the history of present illness.   All other systems reviewed and negative.   PHYSICAL EXAM: VS:  BP 112/64  Pulse 101  Ht 5\' 7"  (1.702 m)  Wt 164 lb 12.8 oz (74.753 kg)  BMI 25.81 kg/m2 Well nourished, well developed, in no acute distress HEENT: normal Neck: no JVD Cardiac:  normal S1, S2; irregularly irregular; no murmur Lungs:  clear to auscultation bilaterally, no wheezing, rhonchi or rales Abd: soft, nontender, no hepatomegaly Ext: no edema Skin: warm and dry Neuro:  CNs 2-12 intact, no focal abnormalities noted  EKG: from 04/18/14, afib, rate 91     ASSESSMENT AND PLAN:  1. Atrial fibrillation- found on exam and evidenced on EKG from 04/18/14. Patient without symptoms at this time. Given that patient is still in afib and has been on eliquis for almost 4 weeks, will schedule for DC cardioversion in the next 1-2 weeks. Risks and benefits of the procedure discussed with patient.  Risks include anesthesia risks, need for temporary transcutanous pacing or transvenous pacing. Given patients blood pressure will continue current dosing of her atenolol. She is to continue the eliquis until her cardioversion and indefinitely after that given her CHADS VASC score of 4 (female-1, age>65-2, HTN-1) 2. HTN- continue current dosing of atenolol and chlorthalidone.   Signed, Tommi Rumps, MD 05/15/2014 3:46 PM   Patient's chart reviewed and patient seen, interviewed and examined and agree with note above as outlined by the Resident with minor changes.  Found recently on PE to be in new onset atrial fibrillation of unknown duration and fairly asymptomatic except for some mild occasional dizziness and fatiguge.  HR fairly well controlled.  She has been on Eliquis for almost 4 weeks.  She is going to the  beach for a week and so will plan DCCV when she gets back.  I will get a 2D echo to assess LVF and LA size.  Signed:  Fransico Him, MD 05/15/2014

## 2014-05-16 DIAGNOSIS — I4891 Unspecified atrial fibrillation: Secondary | ICD-10-CM | POA: Diagnosis not present

## 2014-05-26 ENCOUNTER — Encounter (HOSPITAL_COMMUNITY): Payer: Self-pay | Admitting: Pharmacy Technician

## 2014-05-28 ENCOUNTER — Other Ambulatory Visit: Payer: Self-pay | Admitting: General Surgery

## 2014-05-28 ENCOUNTER — Other Ambulatory Visit (INDEPENDENT_AMBULATORY_CARE_PROVIDER_SITE_OTHER): Payer: Medicare Other

## 2014-05-28 DIAGNOSIS — Z79899 Other long term (current) drug therapy: Secondary | ICD-10-CM

## 2014-05-28 DIAGNOSIS — I4891 Unspecified atrial fibrillation: Secondary | ICD-10-CM

## 2014-05-28 LAB — BASIC METABOLIC PANEL
BUN: 17 mg/dL (ref 6–23)
CO2: 30 mEq/L (ref 19–32)
Calcium: 10 mg/dL (ref 8.4–10.5)
Chloride: 100 mEq/L (ref 96–112)
Creatinine, Ser: 0.9 mg/dL (ref 0.4–1.2)
GFR: 61.78 mL/min (ref >60.00)
Glucose, Bld: 114 mg/dL — ABNORMAL HIGH (ref 70–99)
Potassium: 3.2 mEq/L — ABNORMAL LOW (ref 3.5–5.1)
Sodium: 141 mEq/L (ref 135–145)

## 2014-05-28 LAB — CBC WITH DIFFERENTIAL/PLATELET
Basophils Absolute: 0 10*3/uL (ref 0.0–0.1)
Basophils Relative: 0.8 % (ref 0.0–3.0)
Eosinophils Absolute: 0 10*3/uL (ref 0.0–0.7)
Eosinophils Relative: 0.6 % (ref 0.0–5.0)
HCT: 41.8 % (ref 36.0–46.0)
Hemoglobin: 13.9 g/dL (ref 12.0–15.0)
Lymphocytes Relative: 22.2 % (ref 12.0–46.0)
Lymphs Abs: 1.2 10*3/uL (ref 0.7–4.0)
MCHC: 33.4 g/dL (ref 30.0–36.0)
MCV: 96.5 fl (ref 78.0–100.0)
Monocytes Absolute: 0.5 10*3/uL (ref 0.1–1.0)
Monocytes Relative: 9.9 % (ref 3.0–12.0)
Neutro Abs: 3.6 10*3/uL (ref 1.4–7.7)
Neutrophils Relative %: 66.5 % (ref 43.0–77.0)
Platelets: 270 10*3/uL (ref 150.0–400.0)
RBC: 4.33 Mil/uL (ref 3.87–5.11)
RDW: 14.8 % (ref 11.5–15.5)
WBC: 5.4 10*3/uL (ref 4.0–10.5)

## 2014-05-28 LAB — PROTIME-INR
INR: 1.4 ratio — ABNORMAL HIGH (ref 0.8–1.0)
Prothrombin Time: 14.9 s — ABNORMAL HIGH (ref 9.6–13.1)

## 2014-05-28 MED ORDER — POTASSIUM CHLORIDE CRYS ER 20 MEQ PO TBCR: 20.0000 meq | EXTENDED_RELEASE_TABLET | Freq: Every day | ORAL | Status: DC

## 2014-06-02 ENCOUNTER — Ambulatory Visit (HOSPITAL_COMMUNITY)
Admission: RE | Admit: 2014-06-02 | Discharge: 2014-06-02 | Disposition: A | Discharge status: Home / Self Care | Payer: Medicare Other | Source: Ambulatory Visit | Attending: Cardiology | Admitting: Cardiology

## 2014-06-02 ENCOUNTER — Encounter (HOSPITAL_COMMUNITY): Payer: Self-pay | Admitting: Certified Registered Nurse Anesthetist

## 2014-06-02 ENCOUNTER — Other Ambulatory Visit: Payer: Self-pay | Admitting: Cardiology

## 2014-06-02 ENCOUNTER — Encounter (HOSPITAL_COMMUNITY)
Admission: RE | Disposition: A | Discharge status: Home / Self Care | Payer: Self-pay | Source: Ambulatory Visit | Attending: Cardiology

## 2014-06-02 ENCOUNTER — Other Ambulatory Visit: Payer: Self-pay | Admitting: General Surgery

## 2014-06-02 ENCOUNTER — Telehealth: Payer: Self-pay | Admitting: Cardiology

## 2014-06-02 ENCOUNTER — Encounter (HOSPITAL_COMMUNITY): Payer: Medicare Other | Admitting: Certified Registered Nurse Anesthetist

## 2014-06-02 ENCOUNTER — Ambulatory Visit (HOSPITAL_COMMUNITY): Payer: Medicare Other | Admitting: Certified Registered Nurse Anesthetist

## 2014-06-02 DIAGNOSIS — I10 Essential (primary) hypertension: Secondary | ICD-10-CM | POA: Diagnosis not present

## 2014-06-02 DIAGNOSIS — I4891 Unspecified atrial fibrillation: Secondary | ICD-10-CM | POA: Insufficient documentation

## 2014-06-02 DIAGNOSIS — I4819 Other persistent atrial fibrillation: Secondary | ICD-10-CM

## 2014-06-02 DIAGNOSIS — E785 Hyperlipidemia, unspecified: Secondary | ICD-10-CM | POA: Insufficient documentation

## 2014-06-02 DIAGNOSIS — I482 Chronic atrial fibrillation, unspecified: Secondary | ICD-10-CM

## 2014-06-02 DIAGNOSIS — Z79899 Other long term (current) drug therapy: Secondary | ICD-10-CM

## 2014-06-02 LAB — BASIC METABOLIC PANEL
BUN: 13 mg/dL (ref 6–23)
CO2: 29 mEq/L (ref 19–32)
Calcium: 9.2 mg/dL (ref 8.4–10.5)
Chloride: 102 mEq/L (ref 96–112)
Creatinine, Ser: 0.81 mg/dL (ref 0.50–1.10)
GFR calc Af Amer: 78 mL/min — ABNORMAL LOW (ref >90)
GFR calc non Af Amer: 67 mL/min — ABNORMAL LOW (ref >90)
Glucose, Bld: 104 mg/dL — ABNORMAL HIGH (ref 70–99)
Potassium: 3.5 mEq/L — ABNORMAL LOW (ref 3.7–5.3)
Sodium: 141 mEq/L (ref 137–147)

## 2014-06-02 LAB — POCT I-STAT 4, (NA,K, GLUC, HGB,HCT)
Glucose, Bld: 103 mg/dL — ABNORMAL HIGH (ref 70–99)
HCT: 42 % (ref 36.0–46.0)
Hemoglobin: 14.3 g/dL (ref 12.0–15.0)
Potassium: 3.1 mEq/L — ABNORMAL LOW (ref 3.7–5.3)
Sodium: 141 mEq/L (ref 137–147)

## 2014-06-02 SURGERY — CANCELLED PROCEDURE
Anesthesia: Monitor Anesthesia Care

## 2014-06-02 MED ORDER — POTASSIUM CHLORIDE CRYS ER 20 MEQ PO TBCR: 20.0000 meq | EXTENDED_RELEASE_TABLET | Freq: Two times a day (BID) | ORAL | Status: DC

## 2014-06-02 MED ORDER — SODIUM CHLORIDE 0.9 % IV SOLN
INTRAVENOUS | Status: DC
  Administered 2014-06-02: 11:00:00 via INTRAVENOUS

## 2014-06-02 NOTE — H&P (View-Only) (Signed)
Patient ID: Christina Morgan, female   DOB: 18-Sep-1933, 80 y.o.   MRN: [id number]  [facility], [address] [place], [place]  [address] Phone: [phone] Fax:  [phone]   Date:  05/15/2014    ID:  Christina, Morgan [birth year], MRN [id number]   PCP:  No primary provider on file.       Cardiologist:  Fransico Him, MD              History of Present Illness: Christina Morgan is a 80 y.o. female presenting for new patient visit for afib recently diagnosed at PCP visit.   Went for wellness visit and heart was noted to be fast. Was a little fatigued and has been fatigued since 2013. Notes some dizziness for a couple of months. When walking would stumble. No room spinning. It would occur if got up too quick or bent over. Got better on its own.  No CP, palpitations, dyspnea, no reported snoring or issues sleeping. Has been on eliquis since 04/18/14 and tolerating well.    History of HTN and HLD. Under control at this time. No history of MI. No history of arrhythmia.    No tobacco. No alcohol. No illicits.    Mother with MI and died at 25.       Wt Readings from Last 3 Encounters:   12/31/12  165 lb 9.6 oz (75.116 kg)         Past Medical History   Diagnosis  Date   .  Cancer         endometrial ca   .  Dyslipidemia     .  Hypertension     .  Osteopenia     .  Lipoma of skin         multiple   .  Hyperlipidemia     .  Fracture of left humerus           Current Outpatient Prescriptions   Medication  Sig  Dispense  Refill   .  atenolol (TENORMIN) 50 MG tablet           .  atorvastatin (LIPITOR) 10 MG tablet           .  chlorthalidone (HYGROTON) 25 MG tablet           .  fish oil-omega-3 fatty acids 1000 MG capsule  Take 2 g by mouth daily.         .  polyethylene glycol powder (GLYCOLAX/MIRALAX) powder               No current facility-administered medications for this visit.        Allergies:    Allergies   Allergen  Reactions   .   Penicillin V Potassium         dizziness        Social History:  The patient  reports that she has never smoked. She has never used smokeless tobacco. She reports that she does not drink alcohol or use illicit drugs.    Family History:  The patient's family history includes Cancer (age of onset: 32) in her father.    ROS:  Please see the history of present illness.   All other systems reviewed and negative.     PHYSICAL EXAM: VS:  BP 112/64  Pulse 101  Ht 5\' 7"  (1.702 m)  Wt 164 lb 12.8 oz (74.753 kg)  BMI 25.81 kg/m2 Well nourished, well developed,  in no acute distress  HEENT: normal  Neck: no JVD  Cardiac:  normal S1, S2; irregularly irregular; no murmur  Lungs:  clear to auscultation bilaterally, no wheezing, rhonchi or rales  Abd: soft, nontender, no hepatomegaly  Ext: no edema  Skin: warm and dry  Neuro:  CNs 2-12 intact, no focal abnormalities noted   EKG: from 04/18/14, afib, rate 91       ASSESSMENT AND PLAN:    Atrial fibrillation- found on exam and evidenced on EKG from 04/18/14. Patient without symptoms at this time. Given that patient is still in afib and has been on eliquis for almost 4 weeks, will schedule for DC cardioversion in the next 1-2 weeks. Risks and benefits of the procedure discussed with patient. Risks include anesthesia risks, need for temporary transcutanous pacing or transvenous pacing. Given patients blood pressure will continue current dosing of her atenolol. She is to continue the eliquis until her cardioversion and indefinitely after that given her CHADS VASC score of 4 (female-1, age>65-2, HTN-1) HTN- continue current dosing of atenolol and chlorthalidone.     Signed, Tommi Rumps, MD 05/15/2014 3:46 PM    Patient's chart reviewed and patient seen, interviewed and examined and agree with note above as outlined by the Resident with minor changes.  Found recently on PE to be in new onset atrial fibrillation of unknown duration and fairly  asymptomatic except for some mild occasional dizziness and fatiguge.  HR fairly well controlled.  She has been on Eliquis for almost 4 weeks.  She is going to the beach for a week and so will plan DCCV when she gets back.  I will get a 2D echo to assess LVF and LA size.  Signed:  Fransico Him, MD 05/15/2014

## 2014-06-02 NOTE — Telephone Encounter (Signed)
Pt is aware.  

## 2014-06-02 NOTE — Interval H&P Note (Signed)
History and Physical Interval Note:  06/02/2014 11:07 AM  Christina Morgan  has presented today for surgery, with the diagnosis of afib  The various methods of treatment have been discussed with the patient and family. After consideration of risks, benefits and other options for treatment, the patient has consented to  Procedure(s): CARDIOVERSION (N/A) as a surgical intervention .  The patient's history has been reviewed, patient examined, no change in status, stable for surgery.  I have reviewed the patient's chart and labs.  Questions were answered to the patient's satisfaction.     [name] R  The patient started Eliquis on 04/18/14 and has not missed any doses since then.

## 2014-06-02 NOTE — Anesthesia Preprocedure Evaluation (Addendum)
Anesthesia Evaluation  Patient identified by MRN, date of birth, ID band Patient awake    Reviewed: Allergy & Precautions, H&P , NPO status , Patient's Chart, lab work & pertinent test results, reviewed documented beta blocker date and time   History of Anesthesia Complications Negative for: history of anesthetic complications  Airway Mallampati: II TM Distance: >3 FB Neck ROM: Full    Dental  (+) Teeth Intact, Dental Advisory Given   Pulmonary neg pulmonary ROS,  breath sounds clear to auscultation        Cardiovascular hypertension, Pt. on medications and Pt. on home beta blockers + dysrhythmias Atrial Fibrillation Rhythm:Irregular     Neuro/Psych negative neurological ROS     GI/Hepatic negative GI ROS, Neg liver ROS,   Endo/Other  negative endocrine ROS  Renal/GU negative Renal ROS     Musculoskeletal   Abdominal   Peds  Hematology negative hematology ROS (+)   Anesthesia Other Findings   Reproductive/Obstetrics                         Anesthesia Physical Anesthesia Plan  ASA: III  Anesthesia Plan: MAC   Post-op Pain Management:    Induction: Intravenous  Airway Management Planned: Mask and Natural Airway  Additional Equipment:   Intra-op Plan:   Post-operative Plan:   Informed Consent: I have reviewed the patients History and Physical, chart, labs and discussed the procedure including the risks, benefits and alternatives for the proposed anesthesia with the patient or authorized representative who has indicated his/her understanding and acceptance.   Dental advisory given  Plan Discussed with: CRNA, Anesthesiologist and Surgeon  Anesthesia Plan Comments:         Anesthesia Quick Evaluation

## 2014-06-02 NOTE — Telephone Encounter (Signed)
Patient's potassium was too low for DCCV today.  She has been rescheduled to Thursday and potassium dose adjusted.  She needs to come in on Wed AM 6/24 for BMET - please schedule

## 2014-06-02 NOTE — Telephone Encounter (Signed)
Labs ordered and r/s lab to the 24th of June. LVM for pt to return call so I can verify she is aware to come in weds.

## 2014-06-02 NOTE — Telephone Encounter (Signed)
Follow Up  ° °Pt returned call//SR  °

## 2014-06-02 NOTE — Progress Notes (Signed)
Dr Radford Pax cancelled case due to low potassium, case rescheduled this Thursday.

## 2014-06-02 NOTE — Interval H&P Note (Signed)
History and Physical Interval Note:  06/02/2014 8:08 AM  [name]  has presented today for surgery, with the diagnosis of afib  The various methods of treatment have been discussed with the patient and family. After consideration of risks, benefits and other options for treatment, the patient has consented to  Procedure(s): CARDIOVERSION (N/A) as a surgical intervention .  The patient's history has been reviewed, patient examined, no change in status, stable for surgery.  I have reviewed the patient's chart and labs.  Questions were answered to the patient's satisfaction.     TURNER,TRACI R

## 2014-06-02 NOTE — Telephone Encounter (Signed)
Called and spoke with pt

## 2014-06-03 ENCOUNTER — Encounter (HOSPITAL_COMMUNITY): Payer: Self-pay | Admitting: Pharmacy Technician

## 2014-06-04 ENCOUNTER — Other Ambulatory Visit: Payer: Self-pay | Admitting: General Surgery

## 2014-06-04 ENCOUNTER — Other Ambulatory Visit (INDEPENDENT_AMBULATORY_CARE_PROVIDER_SITE_OTHER): Payer: Medicare Other

## 2014-06-04 DIAGNOSIS — I890 Lymphedema, not elsewhere classified: Secondary | ICD-10-CM | POA: Diagnosis not present

## 2014-06-04 DIAGNOSIS — C549 Malignant neoplasm of corpus uteri, unspecified: Secondary | ICD-10-CM | POA: Diagnosis not present

## 2014-06-04 DIAGNOSIS — Z923 Personal history of irradiation: Secondary | ICD-10-CM | POA: Diagnosis not present

## 2014-06-04 DIAGNOSIS — E785 Hyperlipidemia, unspecified: Secondary | ICD-10-CM | POA: Diagnosis not present

## 2014-06-04 DIAGNOSIS — Z79899 Other long term (current) drug therapy: Secondary | ICD-10-CM | POA: Diagnosis not present

## 2014-06-04 DIAGNOSIS — Z90710 Acquired absence of both cervix and uterus: Secondary | ICD-10-CM | POA: Diagnosis not present

## 2014-06-04 DIAGNOSIS — M899 Disorder of bone, unspecified: Secondary | ICD-10-CM | POA: Diagnosis not present

## 2014-06-04 DIAGNOSIS — I10 Essential (primary) hypertension: Secondary | ICD-10-CM | POA: Diagnosis not present

## 2014-06-04 DIAGNOSIS — I4891 Unspecified atrial fibrillation: Secondary | ICD-10-CM | POA: Diagnosis not present

## 2014-06-04 DIAGNOSIS — Z9079 Acquired absence of other genital organ(s): Secondary | ICD-10-CM | POA: Diagnosis not present

## 2014-06-04 LAB — BASIC METABOLIC PANEL
BUN: 15 mg/dL (ref 6–23)
CO2: 33 mEq/L — ABNORMAL HIGH (ref 19–32)
Calcium: 9.6 mg/dL (ref 8.4–10.5)
Chloride: 101 mEq/L (ref 96–112)
Creatinine, Ser: 0.9 mg/dL (ref 0.4–1.2)
GFR: 64.16 mL/min (ref >60.00)
Glucose, Bld: 127 mg/dL — ABNORMAL HIGH (ref 70–99)
Potassium: 3.3 mEq/L — ABNORMAL LOW (ref 3.5–5.1)
Sodium: 140 mEq/L (ref 135–145)

## 2014-06-04 NOTE — Progress Notes (Signed)
Pt is to stop diuretic tonight and then tak two tablets of her 20 mew potassium supplement and then stop that tomorrow am. She will have STAT BMET drawn at hospital prior to cardioversion.

## 2014-06-05 ENCOUNTER — Ambulatory Visit (HOSPITAL_COMMUNITY): Payer: Medicare Other | Admitting: Anesthesiology

## 2014-06-05 ENCOUNTER — Encounter (HOSPITAL_COMMUNITY)
Admission: RE | Disposition: A | Discharge status: Home / Self Care | Payer: Self-pay | Source: Ambulatory Visit | Attending: Cardiology

## 2014-06-05 ENCOUNTER — Other Ambulatory Visit: Payer: Medicare Other

## 2014-06-05 ENCOUNTER — Encounter (HOSPITAL_COMMUNITY): Payer: Medicare Other | Admitting: Anesthesiology

## 2014-06-05 ENCOUNTER — Ambulatory Visit (HOSPITAL_COMMUNITY)
Admission: RE | Admit: 2014-06-05 | Discharge: 2014-06-05 | Disposition: A | Discharge status: Home / Self Care | Payer: Medicare Other | Source: Ambulatory Visit | Attending: Cardiology | Admitting: Cardiology

## 2014-06-05 DIAGNOSIS — I10 Essential (primary) hypertension: Secondary | ICD-10-CM | POA: Insufficient documentation

## 2014-06-05 DIAGNOSIS — I4891 Unspecified atrial fibrillation: Secondary | ICD-10-CM | POA: Insufficient documentation

## 2014-06-05 DIAGNOSIS — E785 Hyperlipidemia, unspecified: Secondary | ICD-10-CM | POA: Insufficient documentation

## 2014-06-05 DIAGNOSIS — M949 Disorder of cartilage, unspecified: Secondary | ICD-10-CM | POA: Diagnosis not present

## 2014-06-05 DIAGNOSIS — M899 Disorder of bone, unspecified: Secondary | ICD-10-CM | POA: Insufficient documentation

## 2014-06-05 DIAGNOSIS — I4819 Other persistent atrial fibrillation: Secondary | ICD-10-CM

## 2014-06-05 DIAGNOSIS — Z8542 Personal history of malignant neoplasm of other parts of uterus: Secondary | ICD-10-CM | POA: Diagnosis not present

## 2014-06-05 HISTORY — PX: CARDIOVERSION: SHX1299

## 2014-06-05 LAB — POCT I-STAT 4, (NA,K, GLUC, HGB,HCT)
Glucose, Bld: 133 mg/dL — ABNORMAL HIGH (ref 70–99)
HCT: 44 % (ref 36.0–46.0)
Hemoglobin: 15 g/dL (ref 12.0–15.0)
Potassium: 4.2 mEq/L (ref 3.7–5.3)
Sodium: 138 mEq/L (ref 137–147)

## 2014-06-05 SURGERY — CARDIOVERSION
Anesthesia: General

## 2014-06-05 MED ORDER — SODIUM CHLORIDE 0.9 % IV SOLN
INTRAVENOUS | Status: DC | PRN
  Administered 2014-06-05: 10:00:00 via INTRAVENOUS

## 2014-06-05 MED ORDER — PROPOFOL 10 MG/ML IV BOLUS
INTRAVENOUS | Status: DC | PRN
  Administered 2014-06-05: 70 mg via INTRAVENOUS

## 2014-06-05 MED ORDER — SODIUM CHLORIDE 0.9 % IV SOLN: INTRAVENOUS | Status: DC

## 2014-06-05 MED ORDER — LIDOCAINE HCL (CARDIAC) 20 MG/ML IV SOLN
INTRAVENOUS | Status: AC
  Filled 2014-06-05: qty 5

## 2014-06-05 MED ORDER — LIDOCAINE HCL (CARDIAC) 20 MG/ML IV SOLN
INTRAVENOUS | Status: DC | PRN
  Administered 2014-06-05: 40 mg via INTRAVENOUS

## 2014-06-05 NOTE — Anesthesia Preprocedure Evaluation (Addendum)
Anesthesia Evaluation  Patient identified by MRN, date of birth, ID band Patient awake    Airway Mallampati: I TM Distance: >3 FB Neck ROM: Full    Dental  (+) Teeth Intact, Dental Advisory Given   Pulmonary          Cardiovascular hypertension,     Neuro/Psych    GI/Hepatic   Endo/Other    Renal/GU      Musculoskeletal   Abdominal   Peds  Hematology   Anesthesia Other Findings   Reproductive/Obstetrics                          Anesthesia Physical Anesthesia Plan  ASA: III  Anesthesia Plan: General   Post-op Pain Management:    Induction: Intravenous  Airway Management Planned: Mask  Additional Equipment:   Intra-op Plan:   Post-operative Plan:   Informed Consent: I have reviewed the patients History and Physical, chart, labs and discussed the procedure including the risks, benefits and alternatives for the proposed anesthesia with the patient or authorized representative who has indicated his/her understanding and acceptance.     Plan Discussed with: CRNA and Surgeon  Anesthesia Plan Comments:         Anesthesia Quick Evaluation

## 2014-06-05 NOTE — Anesthesia Postprocedure Evaluation (Signed)
  Anesthesia Post-op Note  Patient: Christina Morgan  Procedure(s) Performed: Procedure(s): CARDIOVERSION (N/A)  Patient Location: PACU and Endoscopy Unit  Anesthesia Type:General  Level of Consciousness: awake and alert   Airway and Oxygen Therapy: Patient Spontanous Breathing  Post-op Pain: none  Post-op Assessment: Post-op Vital signs reviewed  Post-op Vital Signs: stable  Last Vitals:  Filed Vitals:   06/05/14 1011  BP:   Pulse: 65  Temp:   Resp: 18    Complications: No apparent anesthesia complications

## 2014-06-05 NOTE — Progress Notes (Signed)
Pt ISTAT K+ checked prior to preadmission.Marland KitchenMarland KitchenMarland Kitchen

## 2014-06-05 NOTE — Discharge Instructions (Addendum)
Electrical Cardioversion, Care After °Refer to this sheet in the next few weeks. These instructions provide you with information on caring for yourself after your procedure. Your health care provider may also give you more specific instructions. Your treatment has been planned according to current medical practices, but problems sometimes occur. Call your health care provider if you have any problems or questions after your procedure. °WHAT TO EXPECT AFTER THE PROCEDURE °After your procedure, it is typical to have the following sensations: °· Some redness on the skin where the shocks were delivered. If this is tender, a sunburn lotion or hydrocortisone cream may help. °· Possible return of an abnormal heart rhythm within hours or days after the procedure. °HOME CARE INSTRUCTIONS °· Only take medicine as directed by your health care provider. Be sure you understand how and when to take your medicine. °· Learn how to feel your pulse and check it often. °· Limit your activity for 48 hours after the procedure or as directed. °· Avoid or minimize caffeine and other stimulants as directed. °SEEK MEDICAL CARE IF: °· You feel like your heart is beating too fast or your pulse is not regular. °· You have any questions about your medicines. °· You have bleeding that will not stop. °SEEK IMMEDIATE MEDICAL CARE IF: °· You are dizzy or feel faint. °· It is hard to breathe or you feel short of breath. °· There is a change in discomfort in your chest. °· Your speech is slurred or you have trouble moving an arm or leg on one side of your body. °· You get a serious muscle cramp that does not go away. °· Your fingers or toes turn cold or blue. °MAKE SURE YOU:  °· Understand these instructions.   °· Will watch your condition.   °· Will get help right away if you are not doing well or get worse. °Document Released: 09/18/2013 Document Reviewed: 09/18/2013 °[employer]® Patient Information ©2015 [employer]. This information is not  intended to replace advice given to you by your health care provider. Make sure you discuss any questions you have with your health care provider. ° °

## 2014-06-05 NOTE — Interval H&P Note (Signed)
History and Physical Interval Note:  06/05/2014 9:33 AM  Christina Morgan  has presented today for surgery, with the diagnosis of afib  The various methods of treatment have been discussed with the patient and family. After consideration of risks, benefits and other options for treatment, the patient has consented to  Procedure(s): CARDIOVERSION (N/A) as a surgical intervention .  The patient's history has been reviewed, patient examined, no change in status, stable for surgery.  I have reviewed the patient's chart and labs.  Questions were answered to the patient's satisfaction.     Candia Kingsbury R

## 2014-06-05 NOTE — H&P (View-Only) (Signed)
Patient ID: Christina Morgan, female   DOB: 07-08-1933, 80 y.o.   MRN: [id number]  [address], [place], [place]  [address] Phone: [phone] Fax:  [phone]   Date:  05/15/2014    ID:  Christina Morgan, Christina Morgan July 26, 1933, MRN [id number]   PCP:  No primary provider on file.       Cardiologist:  Fransico Him, MD              History of Present Illness: Christina Morgan is a 80 y.o. female presenting for new patient visit for afib recently diagnosed at PCP visit.   Went for wellness visit and heart was noted to be fast. Was a little fatigued and has been fatigued since 2013. Notes some dizziness for a couple of months. When walking would stumble. No room spinning. It would occur if got up too quick or bent over. Got better on its own.  No CP, palpitations, dyspnea, no reported snoring or issues sleeping. Has been on eliquis since 04/18/14 and tolerating well.    History of HTN and HLD. Under control at this time. No history of MI. No history of arrhythmia.    No tobacco. No alcohol. No illicits.    Mother with MI and died at 73.       Wt Readings from Last 3 Encounters:   12/31/12  165 lb 9.6 oz (75.116 kg)         Past Medical History   Diagnosis  Date   .  Cancer         endometrial ca   .  Dyslipidemia     .  Hypertension     .  Osteopenia     .  Lipoma of skin         multiple   .  Hyperlipidemia     .  Fracture of left humerus           Current Outpatient Prescriptions   Medication  Sig  Dispense  Refill   .  atenolol (TENORMIN) 50 MG tablet           .  atorvastatin (LIPITOR) 10 MG tablet           .  chlorthalidone (HYGROTON) 25 MG tablet           .  fish oil-omega-3 fatty acids 1000 MG capsule  Take 2 g by mouth daily.         .  polyethylene glycol powder (GLYCOLAX/MIRALAX) powder               No current facility-administered medications for this visit.        Allergies:    Allergies   Allergen  Reactions   .   Penicillin V Potassium         dizziness        Social History:  The patient  reports that she has never smoked. She has never used smokeless tobacco. She reports that she does not drink alcohol or use illicit drugs.    Family History:  The patient's family history includes Cancer (age of onset: 77) in her father.    ROS:  Please see the history of present illness.   All other systems reviewed and negative.     PHYSICAL EXAM: VS:  BP 112/64  Pulse 101  Ht 5\' 7"  (1.702 m)  Wt 164 lb 12.8 oz (74.753 kg)  BMI 25.81 kg/m2 Well nourished, well developed,  in no acute distress  HEENT: normal  Neck: no JVD  Cardiac:  normal S1, S2; irregularly irregular; no murmur  Lungs:  clear to auscultation bilaterally, no wheezing, rhonchi or rales  Abd: soft, nontender, no hepatomegaly  Ext: no edema  Skin: warm and dry  Neuro:  CNs 2-12 intact, no focal abnormalities noted   EKG: from 04/18/14, afib, rate 91       ASSESSMENT AND PLAN:    Atrial fibrillation- found on exam and evidenced on EKG from 04/18/14. Patient without symptoms at this time. Given that patient is still in afib and has been on eliquis for almost 4 weeks, will schedule for DC cardioversion in the next 1-2 weeks. Risks and benefits of the procedure discussed with patient. Risks include anesthesia risks, need for temporary transcutanous pacing or transvenous pacing. Given patients blood pressure will continue current dosing of her atenolol. She is to continue the eliquis until her cardioversion and indefinitely after that given her CHADS VASC score of 4 (female-1, age>65-2, HTN-1) HTN- continue current dosing of atenolol and chlorthalidone.     Signed, Tommi Rumps, MD 05/15/2014 3:46 PM    Patient's chart reviewed and patient seen, interviewed and examined and agree with note above as outlined by the Resident with minor changes.  Found recently on PE to be in new onset atrial fibrillation of unknown duration and fairly  asymptomatic except for some mild occasional dizziness and fatiguge.  HR fairly well controlled.  She has been on Eliquis for almost 4 weeks.  She is going to the beach for a week and so will plan DCCV when she gets back.  I will get a 2D echo to assess LVF and LA size.  Signed:  Fransico Him, MD 05/15/2014

## 2014-06-05 NOTE — Transfer of Care (Signed)
Immediate Anesthesia Transfer of Care Note  Patient: Christina Morgan  Procedure(s) Performed: Procedure(s): CARDIOVERSION (N/A)  Patient Location: Endoscopy Unit  Anesthesia Type:MAC  Level of Consciousness: awake, alert  and oriented  Airway & Oxygen Therapy: Patient Spontanous Breathing and Patient connected to nasal cannula oxygen  Post-op Assessment: Report given to PACU RN, Post -op Vital signs reviewed and stable and Patient moving all extremities  Post vital signs: Reviewed and stable  Complications: No apparent anesthesia complications

## 2014-06-05 NOTE — CV Procedure (Signed)
Electrical Cardioversion Procedure Note Christina Morgan [id number] 02-Sep-1933  Procedure: Electrical Cardioversion Indications:  Atrial Fibrillation  Time Out: Verified patient identification, verified procedure,medications/allergies/relevent history reviewed, required imaging and test results available.  Performed  Procedure Details  The patient was NPO after midnight. Anesthesia was administered at the beside  by Dr.Massagee with Lidocaine 40mg  IV and 70mg  IV propofol.  Cardioversion was done with synchronized biphasic defibrillation with AP pads with 150watts.  The patient converted to normal sinus rhythm. The patient tolerated the procedure well   IMPRESSION:  Successful cardioversion of atrial fibrillation    Christina Morgan R 06/05/2014, 9:34 AM

## 2014-06-06 ENCOUNTER — Encounter (HOSPITAL_COMMUNITY): Payer: Self-pay | Admitting: Cardiology

## 2014-06-17 ENCOUNTER — Telehealth: Payer: Self-pay | Admitting: Cardiology

## 2014-06-17 NOTE — Telephone Encounter (Signed)
To Dr Turner to advise 

## 2014-06-17 NOTE — Telephone Encounter (Signed)
Pts was taken off Potassium and Chlorthalidone the day before cardioversion. Pt stated that the leg pain was so bad last night and her BP was very high. Pts daughter stated Bp was much better today 140/90s but she wanted to make sure dr Radford Pax knew the BP changed. She will continue monitoring the BP until Thursday when seen by PA. She did wonder if her Potassium level could be really low and that is causing muscle cramping. She asked if mom could come in tomorrow to check a BMET and see if that could be the problem. Her leg pain is better today but is still there.

## 2014-06-17 NOTE — Telephone Encounter (Signed)
New message    daughter spoke with on-call  MD last night.   patient was taken off medication one of two blood pressure meds.   C/O left leg pain   Last night blood pressure  173 /104 .  Pulse 94   This am took blood pressure medication 140/88 pulse 94

## 2014-06-17 NOTE — Telephone Encounter (Signed)
What med was stopped and verify what meds she currently is on.  There is no note in Epic about any MD orders last PM

## 2014-06-18 ENCOUNTER — Other Ambulatory Visit: Payer: Self-pay | Admitting: General Surgery

## 2014-06-18 ENCOUNTER — Other Ambulatory Visit (INDEPENDENT_AMBULATORY_CARE_PROVIDER_SITE_OTHER): Payer: Medicare Other

## 2014-06-18 ENCOUNTER — Encounter: Payer: Self-pay | Admitting: General Surgery

## 2014-06-18 DIAGNOSIS — Z79899 Other long term (current) drug therapy: Secondary | ICD-10-CM

## 2014-06-18 LAB — BASIC METABOLIC PANEL
BUN: 15 mg/dL (ref 6–23)
CO2: 27 mEq/L (ref 19–32)
Calcium: 9.1 mg/dL (ref 8.4–10.5)
Chloride: 104 mEq/L (ref 96–112)
Creatinine, Ser: 0.9 mg/dL (ref 0.4–1.2)
GFR: 67.61 mL/min (ref >60.00)
Glucose, Bld: 111 mg/dL — ABNORMAL HIGH (ref 70–99)
Potassium: 4 mEq/L (ref 3.5–5.1)
Sodium: 139 mEq/L (ref 135–145)

## 2014-06-18 MED ORDER — AMLODIPINE BESYLATE 2.5 MG PO TABS: 2.5000 mg | ORAL_TABLET | Freq: Every day | ORAL | Status: DC

## 2014-06-18 NOTE — Telephone Encounter (Signed)
Pt is aware. New rx sent in for pt. STAT labs put in for pt and put on lab schedule.

## 2014-06-18 NOTE — Telephone Encounter (Signed)
Please have her come in today for stat BMET this am and have her also start Amlodipine 2.5mg  daily.  Have her check her BP daily and for a week and call with results.

## 2014-06-19 ENCOUNTER — Emergency Department (HOSPITAL_COMMUNITY)
Admission: EM | Admit: 2014-06-19 | Discharge: 2014-06-19 | Disposition: A | Discharge status: Home / Self Care | Payer: Medicare Other | Attending: Emergency Medicine | Admitting: Emergency Medicine

## 2014-06-19 ENCOUNTER — Emergency Department (HOSPITAL_COMMUNITY): Payer: Medicare Other

## 2014-06-19 ENCOUNTER — Ambulatory Visit (INDEPENDENT_AMBULATORY_CARE_PROVIDER_SITE_OTHER): Payer: Medicare Other | Admitting: Physician Assistant

## 2014-06-19 ENCOUNTER — Encounter (HOSPITAL_COMMUNITY): Payer: Self-pay | Admitting: Emergency Medicine

## 2014-06-19 ENCOUNTER — Encounter: Payer: Self-pay | Admitting: Physician Assistant

## 2014-06-19 VITALS — BP 160/90 | HR 100 | Ht 67.0 in | Wt 169.0 lb

## 2014-06-19 DIAGNOSIS — R0602 Shortness of breath: Secondary | ICD-10-CM | POA: Diagnosis not present

## 2014-06-19 DIAGNOSIS — Z872 Personal history of diseases of the skin and subcutaneous tissue: Secondary | ICD-10-CM | POA: Diagnosis not present

## 2014-06-19 DIAGNOSIS — Z79899 Other long term (current) drug therapy: Secondary | ICD-10-CM | POA: Diagnosis not present

## 2014-06-19 DIAGNOSIS — Z8542 Personal history of malignant neoplasm of other parts of uterus: Secondary | ICD-10-CM | POA: Diagnosis not present

## 2014-06-19 DIAGNOSIS — I10 Essential (primary) hypertension: Secondary | ICD-10-CM | POA: Diagnosis not present

## 2014-06-19 DIAGNOSIS — I4891 Unspecified atrial fibrillation: Secondary | ICD-10-CM

## 2014-06-19 DIAGNOSIS — M25552 Pain in left hip: Secondary | ICD-10-CM

## 2014-06-19 DIAGNOSIS — Z8781 Personal history of (healed) traumatic fracture: Secondary | ICD-10-CM | POA: Insufficient documentation

## 2014-06-19 DIAGNOSIS — Z7901 Long term (current) use of anticoagulants: Secondary | ICD-10-CM | POA: Insufficient documentation

## 2014-06-19 DIAGNOSIS — Z880 Allergy status to penicillin: Secondary | ICD-10-CM | POA: Insufficient documentation

## 2014-06-19 DIAGNOSIS — M25559 Pain in unspecified hip: Secondary | ICD-10-CM | POA: Insufficient documentation

## 2014-06-19 DIAGNOSIS — E785 Hyperlipidemia, unspecified: Secondary | ICD-10-CM | POA: Diagnosis not present

## 2014-06-19 DIAGNOSIS — K5730 Diverticulosis of large intestine without perforation or abscess without bleeding: Secondary | ICD-10-CM | POA: Diagnosis not present

## 2014-06-19 DIAGNOSIS — R269 Unspecified abnormalities of gait and mobility: Secondary | ICD-10-CM | POA: Insufficient documentation

## 2014-06-19 DIAGNOSIS — E782 Mixed hyperlipidemia: Secondary | ICD-10-CM | POA: Diagnosis not present

## 2014-06-19 DIAGNOSIS — I480 Paroxysmal atrial fibrillation: Secondary | ICD-10-CM

## 2014-06-19 DIAGNOSIS — R109 Unspecified abdominal pain: Secondary | ICD-10-CM | POA: Insufficient documentation

## 2014-06-19 DIAGNOSIS — R52 Pain, unspecified: Secondary | ICD-10-CM | POA: Diagnosis not present

## 2014-06-19 DIAGNOSIS — M1610 Unilateral primary osteoarthritis, unspecified hip: Secondary | ICD-10-CM | POA: Diagnosis not present

## 2014-06-19 DIAGNOSIS — M169 Osteoarthritis of hip, unspecified: Secondary | ICD-10-CM | POA: Diagnosis not present

## 2014-06-19 LAB — URINE MICROSCOPIC-ADD ON

## 2014-06-19 LAB — CBC WITH DIFFERENTIAL/PLATELET
Basophils Absolute: 0 10*3/uL (ref 0.0–0.1)
Basophils Relative: 0 % (ref 0–1)
Eosinophils Absolute: 0 10*3/uL (ref 0.0–0.7)
Eosinophils Relative: 0 % (ref 0–5)
HCT: 39.9 % (ref 36.0–46.0)
Hemoglobin: 13.4 g/dL (ref 12.0–15.0)
Lymphocytes Relative: 14 % (ref 12–46)
Lymphs Abs: 1.1 10*3/uL (ref 0.7–4.0)
MCH: 32.4 pg (ref 26.0–34.0)
MCHC: 33.6 g/dL (ref 30.0–36.0)
MCV: 96.4 fL (ref 78.0–100.0)
Monocytes Absolute: 0.5 10*3/uL (ref 0.1–1.0)
Monocytes Relative: 6 % (ref 3–12)
Neutro Abs: 6.3 10*3/uL (ref 1.7–7.7)
Neutrophils Relative %: 80 % — ABNORMAL HIGH (ref 43–77)
Platelets: 287 10*3/uL (ref 150–400)
RBC: 4.14 MIL/uL (ref 3.87–5.11)
RDW: 13.9 % (ref 11.5–15.5)
WBC: 7.9 10*3/uL (ref 4.0–10.5)

## 2014-06-19 LAB — URINALYSIS, ROUTINE W REFLEX MICROSCOPIC
Bilirubin Urine: NEGATIVE
Glucose, UA: NEGATIVE mg/dL
Ketones, ur: NEGATIVE mg/dL
Nitrite: NEGATIVE
Protein, ur: NEGATIVE mg/dL
Specific Gravity, Urine: 1.035 — ABNORMAL HIGH (ref 1.005–1.030)
Urobilinogen, UA: 0.2 mg/dL (ref 0.0–1.0)
pH: 6 (ref 5.0–8.0)

## 2014-06-19 LAB — COMPREHENSIVE METABOLIC PANEL
ALT: 19 U/L (ref 0–35)
AST: 19 U/L (ref 0–37)
Albumin: 3.9 g/dL (ref 3.5–5.2)
Alkaline Phosphatase: 65 U/L (ref 39–117)
Anion gap: 13 (ref 5–15)
BUN: 16 mg/dL (ref 6–23)
CO2: 26 mEq/L (ref 19–32)
Calcium: 9.4 mg/dL (ref 8.4–10.5)
Chloride: 101 mEq/L (ref 96–112)
Creatinine, Ser: 0.85 mg/dL (ref 0.50–1.10)
GFR calc Af Amer: 74 mL/min — ABNORMAL LOW (ref >90)
GFR calc non Af Amer: 63 mL/min — ABNORMAL LOW (ref >90)
Glucose, Bld: 121 mg/dL — ABNORMAL HIGH (ref 70–99)
Potassium: 3.8 mEq/L (ref 3.7–5.3)
Sodium: 140 mEq/L (ref 137–147)
Total Bilirubin: 0.6 mg/dL (ref 0.3–1.2)
Total Protein: 7.3 g/dL (ref 6.0–8.3)

## 2014-06-19 MED ORDER — ATENOLOL 50 MG PO TABS: ORAL_TABLET | ORAL | Status: DC

## 2014-06-19 MED ORDER — IOHEXOL 300 MG/ML  SOLN
100.0000 mL | Freq: Once | INTRAMUSCULAR | Status: AC | PRN
  Administered 2014-06-19: 100 mL via INTRAVENOUS

## 2014-06-19 MED ORDER — MORPHINE SULFATE 4 MG/ML IJ SOLN
4.0000 mg | Freq: Once | INTRAMUSCULAR | Status: AC
  Administered 2014-06-19: 4 mg via INTRAVENOUS
  Filled 2014-06-19: qty 1

## 2014-06-19 NOTE — Progress Notes (Signed)
Cardiology Office Note    Date:  06/19/2014   ID:  Christina Morgan, Christina Morgan 1933/03/30, MRN [id number]  PCP:  Kandice Hams, MD  Cardiologist:  Dr. Fransico Him      History of Present Illness: Christina Morgan is a 80 y.o. female with a hx of HTN, HL who was recently seen for CPE and noted to be in AFib.  She was put on Eliquis by her PCP.  She saw Dr. Radford Pax last month and was set up for DCCV.  She converted to NSR with DCCV.  She called in yesterday with worsening BP and was placed on Amlodipine.  She returns for follow up. Since cardioversion, she feels less fatigued. However, she does continue to note fatigue. Her daughter feels that she is short of breath. The patient denies significant dyspnea. She denies chest pain, orthopnea, PND or edema. She denies syncope.   Studies:  - Echo (05/02/14): EF 60% to 65%. No regional wall motion abnormalities. Mild AI, mildly dilated aortic root (37 mm), trivial MR, trivial TR   Recent Labs: 06/05/2014: Hemoglobin 15.0  06/18/2014: Creatinine 0.9; Potassium 4.0   Wt Readings from Last 3 Encounters:  06/19/14 169 lb (76.658 kg)  05/15/14 164 lb 12.8 oz (74.753 kg)  12/31/12 165 lb 9.6 oz (75.116 kg)     Past Medical History  Diagnosis Date  . Cancer     endometrial ca  . Dyslipidemia   . Hypertension   . Osteopenia   . Lipoma of skin     multiple  . Hyperlipidemia   . Fracture of left humerus   . Atrial fibrillation     Echo (05/02/14): EF 60% to 65%. No regional wall motion abnormalities. Mild AI, mildly dilated aortic root (37 mm), trivial MR, trivial TR    Current Outpatient Prescriptions  Medication Sig Dispense Refill  . amLODipine (NORVASC) 2.5 MG tablet Take 1 tablet (2.5 mg total) by mouth daily.  30 tablet  11  . apixaban (ELIQUIS) 5 MG TABS tablet Take 5 mg by mouth 2 (two) times daily.      Marland Kitchen atenolol (TENORMIN) 50 MG tablet Take 50 mg by mouth daily.       Marland Kitchen atorvastatin (LIPITOR) 10 MG tablet Take 10 mg by mouth  daily at 6 PM.       . Calcium Carbonate-Vitamin D (CALTRATE 600+D PO) Take 1 tablet by mouth daily.       . fish oil-omega-3 fatty acids 1000 MG capsule Take 2 g by mouth daily.      . Multiple Vitamin (MULTIVITAMIN) tablet Take 1 tablet by mouth daily.       No current facility-administered medications for this visit.    Allergies:   Penicillins   Social History:  The patient  reports that she has never smoked. She has never used smokeless tobacco. She reports that she does not drink alcohol or use illicit drugs.   Family History:  The patient's family history includes Cancer (age of onset: 50) in her father; Coronary artery disease in her mother; Heart attack in her mother.   ROS:  Please see the history of present illness.   No bleeding problems.    All other systems reviewed and negative.   PHYSICAL EXAM: VS:  BP 160/90  Pulse 100  Ht 5\' 7"  (1.702 m)  Wt 169 lb (76.658 kg)  BMI 26.46 kg/m2 Well nourished, well developed, in no acute distress HEENT: normal Neck: no JVD Cardiac:  normal  S1, S2; irregularly irregular rhythm, RRR; no murmur Lungs:  clear to auscultation bilaterally, no wheezing, rhonchi or rales Abd: soft, nontender, no hepatomegaly Ext: no edema Skin: warm and dry Neuro:  CNs 2-12 intact, no focal abnormalities noted  EKG:  AFib, HR 100     ASSESSMENT AND PLAN:  1. Paroxysmal atrial fibrillation:  She remains in AFib.  HR is elevated.  CHADS2-VASc=4.  She requires long term anticoagulation.  She appears to be symptomatic with AFib (fatigue, ?dyspnea).  Rhythm control is probably the best strategy for her.  I reviewed this with the patient and her daughter.  She is at risk for ischemic heart disease given her age, gender, family hx, HTN, HL.  Arrange Lexiscan Myoview.  If normal, consider Class IC drug vs Class III drug.  I will increase Atenolol to 50 mg in A and 25 mg in P for rate control and BP.  2. Shortness of breath:  Likely related to AFib.  With risk  factors and potential for anti-arrhytmic drug Rx, proceed with myoview as noted.  3. Essential hypertension, benign:  Continue Amlodipine and increase Atenolol as noted.  4. Mixed hyperlipidemia:  Continue statin. 5. Disposition:  F/u with Dr. Fransico Him or me in 2-3 weeks.    Signed, Versie Starks, MHS 06/19/2014 12:40 PM    [facility] [place], [place], [place]  [address] Phone: [phone]; Fax: [phone]

## 2014-06-19 NOTE — Patient Instructions (Signed)
Increase your Atenolol to 1 tablet in the morning (50 mg) and 1/2 tablet in the evening (25 mg).  Take about 12 hours apart.  Your physician has requested that you have a lexiscan myoview. For further information please visit [email or web address]. Please follow instruction sheet, as given.   Your physician recommends that you schedule a follow-up appointment in: 2-3 weeks with Dr. Fransico Him or Richardson Dopp, PA-C on a day Dr. Radford Pax is in the office.

## 2014-06-19 NOTE — ED Provider Notes (Signed)
CSN: [id number]     Arrival date & time 06/19/14  1709 History   First MD Initiated Contact with Patient 06/19/14 1913     Chief Complaint  Patient presents with  . Hip Pain     (Consider location/radiation/quality/duration/timing/severity/associated sxs/prior Treatment) HPI Comments: 80 yo female presenting after sudden onset left hip pain.  She had been walking in the mall (walking farther than she normally does).  She then bent over to try on a pair of pants and had sudden onset, severe pain in her left hip.    Patient is a 80 y.o. female presenting with hip pain.  Hip Pain This is a new problem. Episode onset: several hours ago. The problem occurs constantly. The problem has not changed since onset.Pertinent negatives include no chest pain, no abdominal pain and no shortness of breath. Associated symptoms comments: No weakness, no numbness.. Exacerbated by: walking. Nothing relieves the symptoms. She has tried nothing for the symptoms.    Past Medical History  Diagnosis Date  . Cancer     endometrial ca  . Dyslipidemia   . Hypertension   . Osteopenia   . Lipoma of skin     multiple  . Hyperlipidemia   . Fracture of left humerus   . Atrial fibrillation     Echo (05/02/14): EF 60% to 65%. No regional wall motion abnormalities. Mild AI, mildly dilated aortic root (37 mm), trivial MR, trivial TR   Past Surgical History  Procedure Laterality Date  . Colonoscopy  2008  . Robotic assisted supracervical hysterectomy with bilateral salpingo oopherectomy  11/06/2012    and bilateral pelvic lymph node dissection  . Cardioversion N/A 06/05/2014    Procedure: CARDIOVERSION;  Surgeon: Sueanne Margarita, MD;  Location: [facility];  Service: Cardiovascular;  Laterality: N/A;  . Cardiac catheterization     Family History  Problem Relation Age of Onset  . Cancer Father 40    metastatic oropharyngeal ca  . Heart attack Mother   . Coronary artery disease Mother    History  Substance Use  Topics  . Smoking status: Never Smoker   . Smokeless tobacco: Never Used  . Alcohol Use: No   OB History   Grav Para Term Preterm Abortions TAB SAB Ect Mult Living                 Obstetric Comments   G2P2 with 2 vaginal deliveries. Reports menarche in her teens and menopause in her 9 s. Denies hx of abnormal pap smears. Patient reports last mammogram was 04/2012.     Review of Systems  Respiratory: Negative for shortness of breath.   Cardiovascular: Negative for chest pain.  Gastrointestinal: Negative for abdominal pain.  All other systems reviewed and are negative.     Allergies  Penicillins  Home Medications   Prior to Admission medications   Medication Sig Start Date End Date Taking? Authorizing Provider  amLODipine (NORVASC) 2.5 MG tablet Take 1 tablet (2.5 mg total) by mouth daily. 06/18/14   Sueanne Margarita, MD  apixaban (ELIQUIS) 5 MG TABS tablet Take 5 mg by mouth 2 (two) times daily.    Historical Provider, MD  atenolol (TENORMIN) 50 MG tablet Take one tablet (50 mg) in the morning and 1/2 tablet (25 mg) in the evening (12 hours apart) 06/19/14   Liliane Shi, PA-C  atorvastatin (LIPITOR) 10 MG tablet Take 10 mg by mouth daily at 6 PM.  10/04/12   Historical Provider, MD  Calcium  Carbonate-Vitamin D (CALTRATE 600+D PO) Take 1 tablet by mouth daily.     Historical Provider, MD  fish oil-omega-3 fatty acids 1000 MG capsule Take 2 g by mouth daily.    Historical Provider, MD  Multiple Vitamin (MULTIVITAMIN) tablet Take 1 tablet by mouth daily.    Historical Provider, MD   BP 165/97  Pulse 107  Temp(Src) 98.1 F (36.7 C) (Oral)  Resp 19  SpO2 99% Physical Exam  Nursing note and vitals reviewed. Constitutional: She is oriented to person, place, and time. She appears well-developed and well-nourished. No distress.  HENT:  Head: Normocephalic and atraumatic.  Eyes: Conjunctivae are normal. No scleral icterus.  Neck: Neck supple.  Cardiovascular: Normal rate and  intact distal pulses.   Pulmonary/Chest: Effort normal. No stridor. No respiratory distress.  Abdominal: Soft. Normal appearance. She exhibits no distension. There is no tenderness. There is no rebound and no guarding.  Musculoskeletal:       Left hip: She exhibits tenderness. She exhibits normal range of motion (pain with hip flexion.  ), normal strength, no swelling, no crepitus, no deformity and no laceration.       Legs: Neurological: She is alert and oriented to person, place, and time. Gait (able to ambulate with antalgic gait.  ) abnormal.  Skin: Skin is warm and dry. No rash noted.  Psychiatric: She has a normal mood and affect. Her behavior is normal.    ED Course  Procedures (including critical care time) Labs Review Labs Reviewed  CBC WITH DIFFERENTIAL - Abnormal; Notable for the following:    Neutrophils Relative % 80 (*)    All other components within normal limits  COMPREHENSIVE METABOLIC PANEL - Abnormal; Notable for the following:    Glucose, Bld 121 (*)    GFR calc non Af Amer 63 (*)    GFR calc Af Amer 74 (*)    All other components within normal limits  URINALYSIS, ROUTINE W REFLEX MICROSCOPIC - Abnormal; Notable for the following:    APPearance CLOUDY (*)    Specific Gravity, Urine 1.035 (*)    Hgb urine dipstick TRACE (*)    Leukocytes, UA LARGE (*)    All other components within normal limits  URINE MICROSCOPIC-ADD ON - Abnormal; Notable for the following:    Bacteria, UA FEW (*)    All other components within normal limits  URINE CULTURE    Imaging Review Dg Hip Complete Left  06/19/2014   CLINICAL DATA:  Left hip pain for 2 weeks.  No known injury.  EXAM: LEFT HIP - COMPLETE 2+ VIEW  COMPARISON:  None.  FINDINGS: No fracture or bone lesion. Hip joints are normally spaced and aligned with no arthropathic change. SI joints and symphysis pubis are normally aligned. There are degenerative changes of the visible lower lumbar spine particularly on the left at  L4-L5.  Bones are diffusely demineralized. Soft tissues show vascular calcifications but are otherwise unremarkable.  IMPRESSION: No fracture or bone lesion. No hip joint abnormality. Degenerative changes of the lumbar spine which could be the source of left hip pain.   Electronically Signed   By: Lajean Manes M.D.   On: 06/19/2014 18:29   Ct Abdomen Pelvis W Contrast  06/19/2014   CLINICAL DATA:  Left abdominal and hip pain. On anticoagulation. Endometrial carcinoma.  EXAM: CT ABDOMEN AND PELVIS WITH CONTRAST  TECHNIQUE: Multidetector CT imaging of the abdomen and pelvis was performed using the standard protocol following bolus administration of intravenous contrast.  CONTRAST:  177mL OMNIPAQUE IOHEXOL 300 MG/ML  SOLN  COMPARISON:  None.  FINDINGS: No evidence of abdominal or pelvic hematoma. No evidence of hemoperitoneum. Prior hysterectomy noted. Adnexal regions are unremarkable in appearance. No lymphadenopathy identified within the abdomen or pelvis.  Probable tiny sub-cm hepatic cyst noted, but no liver masses are identified. The gallbladder, pancreas, spleen, adrenal glands, and kidneys are normal in appearance. No evidence hydronephrosis. No soft tissue masses are identified.  Diverticulosis is seen predominately involving the sigmoid colon. No evidence of diverticulitis. No other inflammatory process or abnormal fluid collections identified. Normal appendix is visualized. No evidence of bowel obstruction. No suspicious bone lesions identified. Severe degenerative disc disease noted at L4-5.  IMPRESSION: No evidence of abdominal or pelvic hematoma or other acute findings.  Diverticulosis. No radiographic evidence of diverticulitis.   Electronically Signed   By: Earle Gell M.D.   On: 06/19/2014 21:27  All radiology studies independently viewed by me.      EKG Interpretation None      MDM   Final diagnoses:  Acute hip pain, left    80 yo female with left hip pain.  No fall, but pain started  when she was bent over trying on a pair of pants.  Appeared msk on exam.  However, since she is on blood thinners, checked CT to rule out retroperitoneal hematoma.  CT negative.  Pain improved somewhat with morphine.  She was able to ambulate a short distance afterwards.  Her daughter will stay with her tonight . She will follow up with her PCP.  She had pyuria without bacteriuria, so have sent for culture.      Houston Siren III, MD 06/19/14 [phone]

## 2014-06-19 NOTE — ED Notes (Signed)
Pt admits to intermittent lower left calf pain x2 weeks - today while pt was attempting to put pants on, the pt bent over and began to experience an acute onset of left buttock pain described as a "kind of burning" - CMS intact distally. Pt denies any chest pain or shortness of breath. No swelling or redness noted to extremity.

## 2014-06-19 NOTE — ED Notes (Signed)
Pt states that she had been having lt lower leg pain but while trying on clothes this morning, patient started having sharp lt sided hip pain.

## 2014-06-19 NOTE — Discharge Instructions (Signed)
Hip Pain The hips join the upper legs to the lower pelvis. The bones, cartilage, tendons, and muscles of the hip joint perform a lot of work each day holding your body weight and allowing you to move around. Hip pain is a common symptom. It can range from a minor ache to severe pain on 1 or both hips. Pain may be felt on the inside of the hip joint near the groin, or the outside near the buttocks and upper thigh. There may be swelling or stiffness as well. It occurs more often when a person walks or performs activity. There are many reasons hip pain can develop. CAUSES  It is important to work with your caregiver to identify the cause since many conditions can impact the bones, cartilage, muscles, and tendons of the hips. Causes for hip pain include:  Broken (fractured) bones.  Separation of the thighbone from the hip socket (dislocation).  Torn cartilage of the hip joint.  Swelling (inflammation) of a tendon (tendonitis), the sac within the hip joint (bursitis), or a joint.  A weakening in the abdominal wall (hernia), affecting the nerves to the hip.  Arthritis in the hip joint or lining of the hip joint.  Pinched nerves in the back, hip, or upper thigh.  A bulging disc in the spine (herniated disc).  Rarely, bone infection or cancer. DIAGNOSIS  The location of your hip pain will help your caregiver understand what may be causing the pain. A diagnosis is based on your medical history, your symptoms, results from your physical exam, and results from diagnostic tests. Diagnostic tests may include X-ray exams, a computerized magnetic scan (magnetic resonance imaging, MRI), or bone scan. TREATMENT  Treatment will depend on the cause of your hip pain. Treatment may include:  Limiting activities and resting until symptoms improve.  Crutches or other walking supports (a cane or brace).  Ice, elevation, and compression.  Physical therapy or home exercises.  Shoe inserts or special  shoes.  Losing weight.  Medications to reduce pain.  Undergoing surgery. HOME CARE INSTRUCTIONS   Only take over-the-counter or prescription medicines for pain, discomfort, or fever as directed by your caregiver.  Put ice on the injured area:  Put ice in a plastic bag.  Place a towel between your skin and the bag.  Leave the ice on for 15-20 minutes at a time, 03-04 times a day.  Keep your leg raised (elevated) when possible to lessen swelling.  Avoid activities that cause pain.  Follow specific exercises as directed by your caregiver.  Sleep with a pillow between your legs on your most comfortable side.  Record how often you have hip pain, the location of the pain, and what it feels like. This information may be helpful to you and your caregiver.  Ask your caregiver about returning to work or sports and whether you should drive.  Follow up with your caregiver for further exams, therapy, or testing as directed. SEEK MEDICAL CARE IF:   Your pain or swelling continues or worsens after 1 week.  You are feeling unwell or have chills.  You have increasing difficulty with walking.  You have a loss of sensation or other new symptoms.  You have questions or concerns. SEEK IMMEDIATE MEDICAL CARE IF:   You cannot put weight on the affected hip.  You have fallen.  You have a sudden increase in pain and swelling in your hip.  You have a fever. MAKE SURE YOU:   Understand these instructions.  Will watch your condition.  Will get help right away if you are not doing well or get worse. Document Released: 05/18/2010 Document Revised: 02/20/2012 Document Reviewed: 05/18/2010 [facility] Patient Information 2015 [place], [place]. This information is not intended to replace advice given to you by your health care provider. Make sure you discuss any questions you have with your health care provider.

## 2014-06-19 NOTE — ED Notes (Signed)
D/c instructions reviewed w/ pt and family - pt and family deny any further questions or concerns at present.\ 

## 2014-06-22 LAB — URINE CULTURE: Colony Count: >=100000

## 2014-06-23 ENCOUNTER — Ambulatory Visit
Admission: RE | Admit: 2014-06-23 | Discharge: 2014-06-23 | Disposition: A | Discharge status: Home / Self Care | Payer: Medicare Other | Source: Ambulatory Visit | Attending: Internal Medicine | Admitting: Internal Medicine

## 2014-06-23 ENCOUNTER — Other Ambulatory Visit: Payer: Self-pay | Admitting: Internal Medicine

## 2014-06-23 DIAGNOSIS — M545 Low back pain, unspecified: Secondary | ICD-10-CM | POA: Diagnosis not present

## 2014-06-23 DIAGNOSIS — M25559 Pain in unspecified hip: Secondary | ICD-10-CM

## 2014-06-23 DIAGNOSIS — M5137 Other intervertebral disc degeneration, lumbosacral region: Secondary | ICD-10-CM | POA: Diagnosis not present

## 2014-07-01 ENCOUNTER — Ambulatory Visit (HOSPITAL_COMMUNITY): Payer: Medicare Other | Attending: Physician Assistant | Admitting: Radiology

## 2014-07-01 ENCOUNTER — Telehealth: Payer: Self-pay | Admitting: Cardiology

## 2014-07-01 VITALS — BP 133/98 | Ht 67.0 in | Wt 162.0 lb

## 2014-07-01 DIAGNOSIS — R5381 Other malaise: Secondary | ICD-10-CM | POA: Diagnosis not present

## 2014-07-01 DIAGNOSIS — R0602 Shortness of breath: Secondary | ICD-10-CM | POA: Diagnosis not present

## 2014-07-01 DIAGNOSIS — Z8249 Family history of ischemic heart disease and other diseases of the circulatory system: Secondary | ICD-10-CM | POA: Diagnosis not present

## 2014-07-01 DIAGNOSIS — I4891 Unspecified atrial fibrillation: Secondary | ICD-10-CM

## 2014-07-01 DIAGNOSIS — I4949 Other premature depolarization: Secondary | ICD-10-CM | POA: Diagnosis not present

## 2014-07-01 DIAGNOSIS — R5383 Other fatigue: Principal | ICD-10-CM

## 2014-07-01 DIAGNOSIS — I10 Essential (primary) hypertension: Secondary | ICD-10-CM | POA: Diagnosis not present

## 2014-07-01 DIAGNOSIS — I480 Paroxysmal atrial fibrillation: Secondary | ICD-10-CM

## 2014-07-01 MED ORDER — TECHNETIUM TC 99M SESTAMIBI GENERIC - CARDIOLITE
30.0000 | Freq: Once | INTRAVENOUS | Status: AC | PRN
  Administered 2014-07-01: 30 via INTRAVENOUS

## 2014-07-01 MED ORDER — TECHNETIUM TC 99M SESTAMIBI GENERIC - CARDIOLITE
10.0000 | Freq: Once | INTRAVENOUS | Status: AC | PRN
  Administered 2014-07-01: 10 via INTRAVENOUS

## 2014-07-01 MED ORDER — REGADENOSON 0.4 MG/5ML IV SOLN
0.4000 mg | Freq: Once | INTRAVENOUS | Status: AC
  Administered 2014-07-01: 0.4 mg via INTRAVENOUS

## 2014-07-01 NOTE — Telephone Encounter (Signed)
Walk In Pt Form " BP readings" gave to Numa C  7.21.15/km

## 2014-07-01 NOTE — Telephone Encounter (Signed)
Patient brought in BP readings for review.  They range from 130-145/80-180mmHg.  Please have her increase Amlodipine to 5mg  daily and check BP and HR daily for a week and call with results.

## 2014-07-01 NOTE — Progress Notes (Signed)
[facility] [address] [place], [place] [address] [id number]    Cardiology Nuclear Med Study  Christina Morgan is a 80 y.o. female     MRN : [id number]     DOB: 02/06/1933  Procedure Date: 07/01/2014  Nuclear Med Background Indication for Stress Test:  Evaluation for Ischemia History:  6/15 Atrial Fibrillation with Cardioversion;5/15 Echo: EF=60-65% Cardiac Risk Factors: Family History - CAD, Hypertension and Lipids  Symptoms:  Fatigue and SOB   Nuclear Pre-Procedure Caffeine/Decaff Intake:  None> 12 hrs NPO After: 6:30pm   Lungs:  clear O2 Sat: 97% on room air. IV 0.9% NS with Angio Cath:  22g  IV Site: R Antecubital x 1, tolerated well IV Started by:  Irven Baltimore, RN  Chest Size (in):  34 Cup Size: D  Height: 5\' 7"  (1.702 m)  Weight:  162 lb (73.483 kg)  BMI:  Body mass index is 25.37 kg/(m^2). Tech Comments:  Patient took Norvasc, and held Atenolol this am. Irven Baltimore, RN.    Nuclear Med Study 1 or 2 day study: 1 day  Stress Test Type:  Lexiscan  Reading MD: N/A  Order Authorizing Provider:  Fransico Him, MD  Resting Radionuclide: Technetium 94m Sestamibi  Resting Radionuclide Dose: 11.0 mCi   Stress Radionuclide:  Technetium 6m Sestamibi  Stress Radionuclide Dose: 33.0 mCi           Stress Protocol Rest HR: 101 Stress HR: 151  Rest BP: 133/98 Stress BP: 151/102  Exercise Time (min): n/a METS: n/a   Predicted Max HR: 141 bpm % Max HR: 107.09 bpm Rate Pressure Product: 22801   Dose of Adenosine (mg):  n/a Dose of Lexiscan: 0.4 mg  Dose of Atropine (mg): n/a Dose of Dobutamine: n/a mcg/kg/min (at max HR)  Stress Test Technologist: Matilde Haymaker, RN  Nuclear Technologist:  Vedia Pereyra, CNMT     Rest Procedure:  Myocardial perfusion imaging was performed at rest 45 minutes following the intravenous administration of Technetium 99m Sestamibi. Rest ECG: Atrial Fibrilliation  Stress Procedure:  The patient received  IV Lexiscan 0.4 mg over 15-seconds.  Technetium 24m Sestamibi injected at 30-seconds.  Quantitative spect images were obtained after a 45 minute delay. Stress ECG: No significant change from baseline ECG  QPS Raw Data Images:  Normal; no motion artifact; normal heart/lung ratio. Stress Images:  Normal homogeneous uptake in all areas of the myocardium. Rest Images:  Normal homogeneous uptake in all areas of the myocardium. Subtraction (SDS):  No evidence of ischemia. Transient Ischemic Dilatation (Normal <1.22):  1.11 Lung/Heart Ratio (Normal <0.45):  0.30  Quantitative Gated Spect Images QGS EDV:  N/A QGS ESV:  N/A  Impression Exercise Capacity:  Lexiscan with no exercise. BP Response:  Normal blood pressure response. Clinical Symptoms:  No chest pain. ECG Impression:  No significant ST segment change suggestive of ischemia. Comparison with Prior Nuclear Study: No previous nuclear study performed  Overall Impression:  Low risk stress nuclear study. No ischemia or scar. Study not gated because of atrial fibrillation.  LV Ejection Fraction: Study not gated.  LV Wall Motion:  Study not gated  Darlin Coco MD

## 2014-07-02 ENCOUNTER — Encounter: Payer: Self-pay | Admitting: Physician Assistant

## 2014-07-02 MED ORDER — AMLODIPINE BESYLATE 5 MG PO TABS: 5.0000 mg | ORAL_TABLET | Freq: Every day | ORAL | Status: DC

## 2014-07-02 NOTE — Telephone Encounter (Signed)
Pt is aware. Rx sent in for pt and updated med list. Pt will Check BP and HR daily for week and call with results.

## 2014-07-02 NOTE — Addendum Note (Signed)
Addended by: Lily Kocher on: 07/02/2014 08:07 AM   Modules accepted: Orders

## 2014-07-03 ENCOUNTER — Encounter: Payer: Self-pay | Admitting: General Surgery

## 2014-07-03 ENCOUNTER — Other Ambulatory Visit: Payer: Self-pay | Admitting: General Surgery

## 2014-07-03 DIAGNOSIS — I4891 Unspecified atrial fibrillation: Secondary | ICD-10-CM

## 2014-07-03 DIAGNOSIS — Z79899 Other long term (current) drug therapy: Principal | ICD-10-CM

## 2014-07-03 DIAGNOSIS — Z5181 Encounter for therapeutic drug level monitoring: Secondary | ICD-10-CM

## 2014-07-03 MED ORDER — FLECAINIDE ACETATE 50 MG PO TABS: 50.0000 mg | ORAL_TABLET | Freq: Two times a day (BID) | ORAL | Status: DC

## 2014-07-04 ENCOUNTER — Encounter (HOSPITAL_COMMUNITY): Payer: Self-pay | Admitting: Pharmacy Technician

## 2014-07-09 ENCOUNTER — Encounter: Payer: Self-pay | Admitting: General Surgery

## 2014-07-09 ENCOUNTER — Other Ambulatory Visit (INDEPENDENT_AMBULATORY_CARE_PROVIDER_SITE_OTHER): Payer: Medicare Other

## 2014-07-09 ENCOUNTER — Telehealth: Payer: Self-pay | Admitting: Cardiology

## 2014-07-09 DIAGNOSIS — Z79899 Other long term (current) drug therapy: Secondary | ICD-10-CM | POA: Diagnosis not present

## 2014-07-09 LAB — BASIC METABOLIC PANEL
BUN: 16 mg/dL (ref 6–23)
CO2: 31 mEq/L (ref 19–32)
Calcium: 9.4 mg/dL (ref 8.4–10.5)
Chloride: 103 mEq/L (ref 96–112)
Creatinine, Ser: 0.8 mg/dL (ref 0.4–1.2)
GFR: 72.44 mL/min (ref >60.00)
Glucose, Bld: 137 mg/dL — ABNORMAL HIGH (ref 70–99)
Potassium: 4.2 mEq/L (ref 3.5–5.1)
Sodium: 140 mEq/L (ref 135–145)

## 2014-07-09 NOTE — Telephone Encounter (Signed)
Walk in Pt Form " BP Readings" gave to [facility] 7.29.15/km

## 2014-07-10 ENCOUNTER — Telehealth: Payer: Self-pay | Admitting: Cardiology

## 2014-07-10 MED ORDER — AMLODIPINE BESYLATE 10 MG PO TABS: 10.0000 mg | ORAL_TABLET | Freq: Every day | ORAL | Status: DC

## 2014-07-10 NOTE — Addendum Note (Signed)
Addended by: Lily Kocher on: 07/10/2014 05:24 PM   Modules accepted: Orders

## 2014-07-10 NOTE — Telephone Encounter (Signed)
Pt is aware. RX called in for pt. Med list updated.

## 2014-07-10 NOTE — Telephone Encounter (Signed)
Patient brought in BP readings from home that range from 139-144/78-146mmHg.  She has persistent diastolic hypertension.  Increase amlodipine to 10mg  daily and check BP daily for a week and call with results.

## 2014-07-11 ENCOUNTER — Ambulatory Visit (HOSPITAL_COMMUNITY): Payer: Medicare Other | Admitting: Certified Registered"

## 2014-07-11 ENCOUNTER — Ambulatory Visit (HOSPITAL_COMMUNITY)
Admission: RE | Admit: 2014-07-11 | Discharge: 2014-07-11 | Disposition: A | Discharge status: Home / Self Care | Payer: Medicare Other | Source: Ambulatory Visit | Attending: Cardiology | Admitting: Cardiology

## 2014-07-11 ENCOUNTER — Encounter (HOSPITAL_COMMUNITY): Payer: Self-pay | Admitting: Certified Registered"

## 2014-07-11 ENCOUNTER — Encounter (HOSPITAL_COMMUNITY)
Admission: RE | Disposition: A | Discharge status: Home / Self Care | Payer: Medicare Other | Source: Ambulatory Visit | Attending: Cardiology

## 2014-07-11 ENCOUNTER — Encounter (HOSPITAL_COMMUNITY): Payer: Medicare Other | Admitting: Certified Registered"

## 2014-07-11 DIAGNOSIS — I480 Paroxysmal atrial fibrillation: Secondary | ICD-10-CM

## 2014-07-11 DIAGNOSIS — E782 Mixed hyperlipidemia: Secondary | ICD-10-CM | POA: Insufficient documentation

## 2014-07-11 DIAGNOSIS — I4891 Unspecified atrial fibrillation: Secondary | ICD-10-CM | POA: Diagnosis not present

## 2014-07-11 DIAGNOSIS — I10 Essential (primary) hypertension: Secondary | ICD-10-CM | POA: Diagnosis not present

## 2014-07-11 HISTORY — PX: CARDIOVERSION: SHX1299

## 2014-07-11 SURGERY — CARDIOVERSION
Anesthesia: Monitor Anesthesia Care

## 2014-07-11 MED ORDER — SODIUM CHLORIDE 0.9 % IV SOLN
INTRAVENOUS | Status: DC
  Administered 2014-07-11: 500 mL via INTRAVENOUS

## 2014-07-11 MED ORDER — HYDROCORTISONE 1 % EX CREA
1.0000 "application " | TOPICAL_CREAM | Freq: Three times a day (TID) | CUTANEOUS | Status: DC | PRN
  Filled 2014-07-11: qty 28

## 2014-07-11 MED ORDER — PROPOFOL 10 MG/ML IV BOLUS
INTRAVENOUS | Status: DC | PRN
  Administered 2014-07-11: 40 mg via INTRAVENOUS
  Administered 2014-07-11: 20 mg via INTRAVENOUS

## 2014-07-11 MED ORDER — SODIUM CHLORIDE 0.9 % IV SOLN: 250.0000 mL | INTRAVENOUS | Status: DC

## 2014-07-11 MED ORDER — SODIUM CHLORIDE 0.9 % IJ SOLN: 3.0000 mL | INTRAMUSCULAR | Status: DC | PRN

## 2014-07-11 MED ORDER — LIDOCAINE HCL (CARDIAC) 20 MG/ML IV SOLN
INTRAVENOUS | Status: DC | PRN
  Administered 2014-07-11: 60 mg via INTRAVENOUS

## 2014-07-11 MED ORDER — SODIUM CHLORIDE 0.9 % IJ SOLN: 3.0000 mL | Freq: Two times a day (BID) | INTRAMUSCULAR | Status: DC

## 2014-07-11 NOTE — Interval H&P Note (Signed)
History and Physical Interval Note:  07/11/2014 8:28 AM  Christina Morgan  has presented today for surgery, with the diagnosis of A FIB   The various methods of treatment have been discussed with the patient and family. After consideration of risks, benefits and other options for treatment, the patient has consented to  Procedure(s): CARDIOVERSION (N/A) as a surgical intervention .  The patient's history has been reviewed, patient examined, no change in status, stable for surgery.  I have reviewed the patient's chart and labs.  Questions were answered to the patient's satisfaction.     Jermia Rigsby R

## 2014-07-11 NOTE — Discharge Instructions (Signed)

## 2014-07-11 NOTE — Transfer of Care (Signed)
Immediate Anesthesia Transfer of Care Note  Patient: Christina Morgan  Procedure(s) Performed: Procedure(s): CARDIOVERSION (N/A)  Patient Location: Endoscopy Unit  Anesthesia Type:MAC  Level of Consciousness: awake, alert , oriented and patient cooperative  Airway & Oxygen Therapy: Patient Spontanous Breathing and Patient connected to nasal cannula oxygen  Post-op Assessment: Report given to PACU RN, Post -op Vital signs reviewed and stable and Patient moving all extremities  Post vital signs: Reviewed and stable  Complications: No apparent anesthesia complications

## 2014-07-11 NOTE — Anesthesia Postprocedure Evaluation (Signed)
  Anesthesia Post-op Note  Patient: Christina Morgan  Procedure(s) Performed: Procedure(s): CARDIOVERSION (N/A)  Patient Location: Endoscopy Unit  Anesthesia Type:MAC  Level of Consciousness: awake  Airway and Oxygen Therapy: Patient Spontanous Breathing  Post-op Pain: none  Post-op Assessment: Post-op Vital signs reviewed, Patient's Cardiovascular Status Stable and RESPIRATORY FUNCTION UNSTABLE  Post-op Vital Signs: Reviewed and stable  Last Vitals:  Filed Vitals:   07/11/14 1145  BP:   Pulse: 57  Temp:   Resp: 15    Complications: No apparent anesthesia complications

## 2014-07-11 NOTE — CV Procedure (Signed)
   Electrical Cardioversion Procedure Note Christina Morgan [id number] 1933-01-25  Procedure: Electrical Cardioversion Indications:  Atrial Fibrillation  Time Out: Verified patient identification, verified procedure,medications/allergies/relevent history reviewed, required imaging and test results available.  Performed  Procedure Details  The patient was NPO after midnight. Anesthesia was administered at the beside  by Dr.Moser with 60mg  of propofol and 60mg  Lidocaine IV.  Cardioversion was done with synchronized biphasic defibrillation with AP pads with 150watts.  The patient converted to normal sinus rhythm. The patient tolerated the procedure well   IMPRESSION:  Successful cardioversion of atrial fibrillation    TURNER,TRACI R 07/11/2014, 10:26 AM

## 2014-07-11 NOTE — H&P (View-Only) (Signed)
Cardiology Office Note    Date:  06/19/2014   ID:  Christina Morgan, Christina Morgan 1933/11/05, MRN [id number]  PCP:  Kandice Hams, MD  Cardiologist:  Dr. Fransico Him      History of Present Illness: Christina Morgan is a 80 y.o. female with a hx of HTN, HL who was recently seen for CPE and noted to be in AFib.  She was put on Eliquis by her PCP.  She saw Dr. Radford Pax last month and was set up for DCCV.  She converted to NSR with DCCV.  She called in yesterday with worsening BP and was placed on Amlodipine.  She returns for follow up. Since cardioversion, she feels less fatigued. However, she does continue to note fatigue. Her daughter feels that she is short of breath. The patient denies significant dyspnea. She denies chest pain, orthopnea, PND or edema. She denies syncope.   Studies:  - Echo (05/02/14): EF 60% to 65%. No regional wall motion abnormalities. Mild AI, mildly dilated aortic root (37 mm), trivial MR, trivial TR   Recent Labs: 06/05/2014: Hemoglobin 15.0  06/18/2014: Creatinine 0.9; Potassium 4.0   Wt Readings from Last 3 Encounters:  06/19/14 169 lb (76.658 kg)  05/15/14 164 lb 12.8 oz (74.753 kg)  12/31/12 165 lb 9.6 oz (75.116 kg)     Past Medical History  Diagnosis Date  . Cancer     endometrial ca  . Dyslipidemia   . Hypertension   . Osteopenia   . Lipoma of skin     multiple  . Hyperlipidemia   . Fracture of left humerus   . Atrial fibrillation     Echo (05/02/14): EF 60% to 65%. No regional wall motion abnormalities. Mild AI, mildly dilated aortic root (37 mm), trivial MR, trivial TR    Current Outpatient Prescriptions  Medication Sig Dispense Refill  . amLODipine (NORVASC) 2.5 MG tablet Take 1 tablet (2.5 mg total) by mouth daily.  30 tablet  11  . apixaban (ELIQUIS) 5 MG TABS tablet Take 5 mg by mouth 2 (two) times daily.      Marland Kitchen atenolol (TENORMIN) 50 MG tablet Take 50 mg by mouth daily.       Marland Kitchen atorvastatin (LIPITOR) 10 MG tablet Take 10 mg by mouth  daily at 6 PM.       . Calcium Carbonate-Vitamin D (CALTRATE 600+D PO) Take 1 tablet by mouth daily.       . fish oil-omega-3 fatty acids 1000 MG capsule Take 2 g by mouth daily.      . Multiple Vitamin (MULTIVITAMIN) tablet Take 1 tablet by mouth daily.       No current facility-administered medications for this visit.    Allergies:   Penicillins   Social History:  The patient  reports that she has never smoked. She has never used smokeless tobacco. She reports that she does not drink alcohol or use illicit drugs.   Family History:  The patient's family history includes Cancer (age of onset: 37) in her father; Coronary artery disease in her mother; Heart attack in her mother.   ROS:  Please see the history of present illness.   No bleeding problems.    All other systems reviewed and negative.   PHYSICAL EXAM: VS:  BP 160/90  Pulse 100  Ht 5\' 7"  (1.702 m)  Wt 169 lb (76.658 kg)  BMI 26.46 kg/m2 Well nourished, well developed, in no acute distress HEENT: normal Neck: no JVD Cardiac:  normal  S1, S2; irregularly irregular rhythm, RRR; no murmur Lungs:  clear to auscultation bilaterally, no wheezing, rhonchi or rales Abd: soft, nontender, no hepatomegaly Ext: no edema Skin: warm and dry Neuro:  CNs 2-12 intact, no focal abnormalities noted  EKG:  AFib, HR 100     ASSESSMENT AND PLAN:  1. Paroxysmal atrial fibrillation:  She remains in AFib.  HR is elevated.  CHADS2-VASc=4.  She requires long term anticoagulation.  She appears to be symptomatic with AFib (fatigue, ?dyspnea).  Rhythm control is probably the best strategy for her.  I reviewed this with the patient and her daughter.  She is at risk for ischemic heart disease given her age, gender, family hx, HTN, HL.  Arrange Lexiscan Myoview.  If normal, consider Class IC drug vs Class III drug.  I will increase Atenolol to 50 mg in A and 25 mg in P for rate control and BP.  2. Shortness of breath:  Likely related to AFib.  With risk  factors and potential for anti-arrhytmic drug Rx, proceed with myoview as noted.  3. Essential hypertension, benign:  Continue Amlodipine and increase Atenolol as noted.  4. Mixed hyperlipidemia:  Continue statin. 5. Disposition:  F/u with Dr. Fransico Him or me in 2-3 weeks.    Signed, Versie Starks, MHS 06/19/2014 12:40 PM    [facility] [place], [place], [place]  [address] Phone: [phone]; Fax: [phone]

## 2014-07-11 NOTE — Anesthesia Preprocedure Evaluation (Signed)
Anesthesia Evaluation  Patient identified by MRN, date of birth, ID band Patient awake    Reviewed: Allergy & Precautions, H&P , NPO status , Patient's Chart, lab work & pertinent test results, reviewed documented beta blocker date and time   Airway Mallampati: I TM Distance: >3 FB     Dental  (+) Teeth Intact   Pulmonary          Cardiovascular hypertension, + dysrhythmias Atrial Fibrillation Rhythm:Irregular     Neuro/Psych    GI/Hepatic   Endo/Other    Renal/GU      Musculoskeletal   Abdominal   Peds  Hematology   Anesthesia Other Findings   Reproductive/Obstetrics                           Anesthesia Physical Anesthesia Plan  ASA: III  Anesthesia Plan: MAC   Post-op Pain Management:    Induction: Intravenous  Airway Management Planned: Simple Face Mask  Additional Equipment:   Intra-op Plan:   Post-operative Plan:   Informed Consent: I have reviewed the patients History and Physical, chart, labs and discussed the procedure including the risks, benefits and alternatives for the proposed anesthesia with the patient or authorized representative who has indicated his/her understanding and acceptance.     Plan Discussed with: Anesthesiologist and Surgeon  Anesthesia Plan Comments:         Anesthesia Quick Evaluation

## 2014-07-11 NOTE — Anesthesia Postprocedure Evaluation (Signed)
  Anesthesia Post-op Note  Patient: Christina Morgan  Procedure(s) Performed: Procedure(s): CARDIOVERSION (N/A)  Patient Location: Endoscopy Unit  Anesthesia Type:MAC  Level of Consciousness: awake, alert , oriented and patient cooperative  Airway and Oxygen Therapy: Patient Spontanous Breathing and Patient connected to nasal cannula oxygen  Post-op Pain: none  Post-op Assessment: Post-op Vital signs reviewed, Patient's Cardiovascular Status Stable, Respiratory Function Stable, Patent Airway and No signs of Nausea or vomiting  Post-op Vital Signs: Reviewed and stable  Last Vitals:  Filed Vitals:   07/11/14 1034  BP: 155/94  Pulse: 83  Temp: 35.8 C  Resp: 15    Complications: No apparent anesthesia complications

## 2014-07-14 ENCOUNTER — Encounter (HOSPITAL_COMMUNITY): Payer: Self-pay | Admitting: Cardiology

## 2014-07-15 ENCOUNTER — Telehealth (HOSPITAL_COMMUNITY): Payer: Self-pay

## 2014-07-15 ENCOUNTER — Telehealth (HOSPITAL_COMMUNITY): Payer: Self-pay | Admitting: *Deleted

## 2014-07-15 NOTE — Telephone Encounter (Signed)
Encounter complete. 

## 2014-07-17 ENCOUNTER — Ambulatory Visit (HOSPITAL_COMMUNITY)
Admission: RE | Admit: 2014-07-17 | Discharge: 2014-07-17 | Disposition: A | Discharge status: Home / Self Care | Payer: Medicare Other | Source: Ambulatory Visit | Attending: Cardiology | Admitting: Cardiology

## 2014-07-17 DIAGNOSIS — Z5181 Encounter for therapeutic drug level monitoring: Secondary | ICD-10-CM | POA: Insufficient documentation

## 2014-07-17 DIAGNOSIS — I4891 Unspecified atrial fibrillation: Secondary | ICD-10-CM | POA: Diagnosis not present

## 2014-07-17 DIAGNOSIS — Z79899 Other long term (current) drug therapy: Secondary | ICD-10-CM

## 2014-07-17 NOTE — Procedures (Signed)
Exercise Treadmill Test  Pre-Exercise Testing Evaluation  Normal sinus rhythm, normal ECG  Test  Exercise Tolerance Test Ordering MD: Fransico Him, MD    Unique Test No: 1   Treadmill:  1  Indication for ETT: exertional dyspnea  Contraindication to ETT: No   Stress Modality: exercise - treadmill  Cardiac Imaging Performed: non   Protocol: standard Bruce - maximal  Max BP:  210/94  Max MPHR (bpm):  141 85% MPR (bpm):  120  MPHR obtained (bpm):  123 % MPHR obtained:  87  Reached 85% MPHR (min:sec):  4:05 Total Exercise Time (min-sec):  4:30  Workload in METS:  6.4 Borg Scale: 14  Reason ETT Terminated:  Siatic Pain and SOB    ST Segment Analysis At Rest: normal ST segments - no evidence of significant ST depression With Exercise: no evidence of significant ST depression  Other Information Arrhythmia:  occasional PACs, some with aberrancy Angina during ETT:  absent (0) Quality of ETT:  diagnostic  ETT Interpretation:  normal - no evidence of ischemia by ST analysis  Comments: Fair exercise tolerance. Hypertensive response to exercise  Recommendations: No evidence of exercise induced ischemia

## 2014-07-21 ENCOUNTER — Ambulatory Visit (INDEPENDENT_AMBULATORY_CARE_PROVIDER_SITE_OTHER): Payer: Medicare Other | Admitting: Physician Assistant

## 2014-07-21 ENCOUNTER — Encounter: Payer: Self-pay | Admitting: Physician Assistant

## 2014-07-21 VITALS — BP 150/70 | HR 68 | Ht 67.0 in | Wt 166.0 lb

## 2014-07-21 DIAGNOSIS — I4891 Unspecified atrial fibrillation: Secondary | ICD-10-CM | POA: Diagnosis not present

## 2014-07-21 DIAGNOSIS — I10 Essential (primary) hypertension: Secondary | ICD-10-CM | POA: Diagnosis not present

## 2014-07-21 DIAGNOSIS — E782 Mixed hyperlipidemia: Secondary | ICD-10-CM | POA: Diagnosis not present

## 2014-07-21 DIAGNOSIS — I480 Paroxysmal atrial fibrillation: Secondary | ICD-10-CM

## 2014-07-21 NOTE — Progress Notes (Signed)
Cardiology Office Note    Date:  07/21/2014   ID:  Christina, Morgan March 27, 1933, MRN [id number]  PCP:  Kandice Hams, MD  Cardiologist:  Dr. Fransico Him      History of Present Illness: Christina Morgan is a 80 y.o. female with a hx of HTN, HL who was recently seen for CPE and noted to be in AFib.  She was put on Eliquis by her PCP.  She saw Dr. Radford Pax and was set up for DCCV.  I saw her in f/u 7/9 and she was back in AFib.  I had her do a Myoview that was low risk.  She was placed on Flecainide and underwent repeat DCCV 7/31.  F/u ETT was neg for pro-arrhythmia.  She returns for follow up.  She is doing well. She feels better since her last DCCV.  She remains in NSR today.  The patient denies chest pain, shortness of breath, syncope, orthopnea, PND or significant pedal edema.    Studies:  - Echo (05/02/14): EF 60% to 65%. No regional wall motion abnormalities. Mild AI, mildly dilated aortic root (37 mm), trivial MR, trivial TR  - Myoview (06/2014):  Low risk stress nuclear study. No ischemia or scar.  - ETT (07/17/14): normal - no evidence of ischemia by ST analysis   Recent Labs: 06/19/2014: ALT 19; Hemoglobin 13.4  07/09/2014: Creatinine 0.8; Potassium 4.2   Wt Readings from Last 3 Encounters:  07/21/14 166 lb (75.297 kg)  07/01/14 162 lb (73.483 kg)  06/19/14 169 lb (76.658 kg)     Past Medical History  Diagnosis Date  . Cancer     endometrial ca  . Dyslipidemia   . Hypertension   . Osteopenia   . Lipoma of skin     multiple  . Hyperlipidemia   . Fracture of left humerus   . Atrial fibrillation     failed DCCV 05/2014 >>> Flecainide started >>> DCCV 7/15 sucessful;  f/u ETT neg for pro-arrhythmia   . History of cardiovascular stress test     Lexiscan Myoview (06/2014): No ischemia or scar, not gated, low risk  . Hx of echocardiogram     Echo (05/02/14): EF 60% to 65%. No regional wall motion abnormalities. Mild AI, mildly dilated aortic root (37 mm), trivial  MR, trivial TR    Current Outpatient Prescriptions  Medication Sig Dispense Refill  . amLODipine (NORVASC) 10 MG tablet Take 1 tablet (10 mg total) by mouth daily.  30 tablet  11  . apixaban (ELIQUIS) 5 MG TABS tablet Take 5 mg by mouth 2 (two) times daily.      Marland Kitchen atenolol (TENORMIN) 50 MG tablet Take 25-50 mg by mouth daily. Take 50 mg every morning and 25 mg every evening      . atorvastatin (LIPITOR) 10 MG tablet Take 10 mg by mouth daily at 6 PM.       . Calcium Carbonate-Vitamin D (CALTRATE 600+D PO) Take 1 tablet by mouth daily.       . flecainide (TAMBOCOR) 50 MG tablet Take 1 tablet (50 mg total) by mouth 2 (two) times daily.  60 tablet  11  . Multiple Vitamin (MULTIVITAMIN) tablet Take 1 tablet by mouth daily.       No current facility-administered medications for this visit.    Allergies:   Penicillins   Social History:  The patient  reports that she has never smoked. She has never used smokeless tobacco. She reports that she does  not drink alcohol or use illicit drugs.   Family History:  The patient's family history includes Cancer (age of onset: 55) in her father; Coronary artery disease in her mother; Heart attack in her mother.   ROS:  Please see the history of present illness.   No bleeding problems.    All other systems reviewed and negative.   PHYSICAL EXAM: VS:  BP 150/70  Pulse 68  Ht 5\' 7"  (1.702 m)  Wt 166 lb (75.297 kg)  BMI 25.99 kg/m2 Well nourished, well developed, in no acute distress HEENT: normal Neck: no JVD Cardiac:  normal S1, S2; RRR, no murmur Lungs:  clear to auscultation bilaterally, no wheezing, rhonchi or rales Abd: soft, nontender, no hepatomegaly Ext: no edema Skin: warm and dry Neuro:  CNs 2-12 intact, no focal abnormalities noted  EKG:  NSR, HR 68, normal axis, QRS 84 ms, no ST changes    ASSESSMENT AND PLAN:  Paroxysmal atrial fibrillation:  Maintaining NSR on Flecainide.  F/u ETT neg for pro-arrhythmia.  She is improved  symptomatically.  CHADS2-VASc=4.  She requires long term anticoagulation.  continue Eliquis.  Essential hypertension, benign:  BP optimal at home.  She has "white coat HTN."  Continue current regimen.  Mixed hyperlipidemia:  Continue statin.  Disposition:  F/u with Dr. Fransico Him 3 mos.    Signed, Christina Morgan, MHS 07/21/2014 3:43 PM    [facility] [place], [place], [place]  [address] Phone: [phone]; Fax: [phone]

## 2014-07-21 NOTE — Patient Instructions (Signed)
Your physician recommends that you continue on your current medications as directed. Please refer to the Current Medication list given to you today.  Your physician recommends that you schedule a follow-up appointment in: 3 months with Dr Turner 

## 2014-08-11 ENCOUNTER — Other Ambulatory Visit: Payer: Self-pay

## 2014-08-11 DIAGNOSIS — Z1231 Encounter for screening mammogram for malignant neoplasm of breast: Secondary | ICD-10-CM

## 2014-08-13 ENCOUNTER — Telehealth: Payer: Self-pay | Admitting: Physician Assistant

## 2014-08-13 DIAGNOSIS — D1779 Benign lipomatous neoplasm of other sites: Secondary | ICD-10-CM | POA: Diagnosis not present

## 2014-08-13 DIAGNOSIS — L259 Unspecified contact dermatitis, unspecified cause: Secondary | ICD-10-CM | POA: Diagnosis not present

## 2014-08-13 DIAGNOSIS — D485 Neoplasm of uncertain behavior of skin: Secondary | ICD-10-CM | POA: Diagnosis not present

## 2014-08-13 DIAGNOSIS — D239 Other benign neoplasm of skin, unspecified: Secondary | ICD-10-CM | POA: Diagnosis not present

## 2014-08-13 DIAGNOSIS — L821 Other seborrheic keratosis: Secondary | ICD-10-CM | POA: Diagnosis not present

## 2014-08-14 NOTE — Telephone Encounter (Signed)
Walk-In Patient form received from front reception with Ms. Balingit at home blood pressure documentation attached: given to [facility] F/Scott W on 9.3.15:djc

## 2014-08-15 DIAGNOSIS — I4891 Unspecified atrial fibrillation: Secondary | ICD-10-CM | POA: Diagnosis not present

## 2014-08-15 DIAGNOSIS — R609 Edema, unspecified: Secondary | ICD-10-CM | POA: Diagnosis not present

## 2014-08-19 ENCOUNTER — Encounter: Payer: Self-pay | Admitting: General Surgery

## 2014-08-19 ENCOUNTER — Other Ambulatory Visit: Payer: Self-pay | Admitting: Cardiology

## 2014-08-19 ENCOUNTER — Encounter: Payer: Self-pay | Admitting: Cardiology

## 2014-08-19 ENCOUNTER — Ambulatory Visit (INDEPENDENT_AMBULATORY_CARE_PROVIDER_SITE_OTHER): Payer: Medicare Other | Admitting: Cardiology

## 2014-08-19 VITALS — BP 120/82 | HR 85 | Ht 67.0 in | Wt 166.0 lb

## 2014-08-19 DIAGNOSIS — I4891 Unspecified atrial fibrillation: Secondary | ICD-10-CM

## 2014-08-19 DIAGNOSIS — I480 Paroxysmal atrial fibrillation: Secondary | ICD-10-CM

## 2014-08-19 DIAGNOSIS — I4892 Unspecified atrial flutter: Secondary | ICD-10-CM | POA: Diagnosis not present

## 2014-08-19 DIAGNOSIS — I10 Essential (primary) hypertension: Secondary | ICD-10-CM | POA: Diagnosis not present

## 2014-08-19 DIAGNOSIS — R609 Edema, unspecified: Secondary | ICD-10-CM | POA: Diagnosis not present

## 2014-08-19 DIAGNOSIS — R600 Localized edema: Secondary | ICD-10-CM

## 2014-08-19 LAB — BASIC METABOLIC PANEL
BUN: 23 mg/dL (ref 6–23)
CO2: 28 mEq/L (ref 19–32)
Calcium: 9.4 mg/dL (ref 8.4–10.5)
Chloride: 105 mEq/L (ref 96–112)
Creatinine, Ser: 1 mg/dL (ref 0.4–1.2)
GFR: 56.14 mL/min — ABNORMAL LOW (ref >60.00)
Glucose, Bld: 108 mg/dL — ABNORMAL HIGH (ref 70–99)
Potassium: 3.7 mEq/L (ref 3.5–5.1)
Sodium: 140 mEq/L (ref 135–145)

## 2014-08-19 MED ORDER — POTASSIUM CHLORIDE CRYS ER 20 MEQ PO TBCR: 20.0000 meq | EXTENDED_RELEASE_TABLET | Freq: Every day | ORAL | Status: DC

## 2014-08-19 MED ORDER — FUROSEMIDE 20 MG PO TABS: 20.0000 mg | ORAL_TABLET | Freq: Every day | ORAL | Status: DC

## 2014-08-19 MED ORDER — FLECAINIDE ACETATE 100 MG PO TABS: 100.0000 mg | ORAL_TABLET | Freq: Two times a day (BID) | ORAL | Status: DC

## 2014-08-19 NOTE — H&P (Signed)
[place], [place], [place]  [address] Phone: [phone] Fax:  [phone]   Date:  08/19/2014    ID:  Brenley, Gardell Mar 05, 1933, MRN [id number]   PCP:  Kandice Hams, MD          Cardiologist:  Fransico Him, MD              History of Present Illness: Christina Morgan is a 80 y.o. female with a hx of HTN, HL who was recently seen for CPE and noted to be in AFib. She was put on Eliquis by her PCP. She underwent DCCV. She was seen back in f/u 7/9 and she was back in AFib.A stress Myoview was done that was low risk. She was placed on Flecainide and underwent repeat DCCV 7/31. F/u ETT was neg for pro-arrhythmia. She returns for follow up. She is doing well.The patient denies chest pain, shortness of breath, palpitations, syncope, orthopnea or PND.  She recently has had problems with Le edema and saw her PCP and was placed on Lasix for 3 days.  An EKG was done which showed recurrent atrial fibrillation.          Wt Readings from Last 3 Encounters:   08/19/14  166 lb (75.297 kg)   07/21/14  166 lb (75.297 kg)   07/01/14  162 lb (73.483 kg)         Past Medical History   Diagnosis  Date   .  Cancer         endometrial ca   .  Dyslipidemia     .  Hypertension     .  Osteopenia     .  Lipoma of skin         multiple   .  Hyperlipidemia     .  Fracture of left humerus     .  Atrial fibrillation         failed DCCV 05/2014 >>> Flecainide started >>> DCCV 7/15 sucessful;  f/u ETT neg for pro-arrhythmia    .  History of cardiovascular stress test         Lexiscan Myoview (06/2014): No ischemia or scar, not gated, low risk   .  Hx of echocardiogram         Echo (05/02/14): EF 60% to 65%. No regional wall motion abnormalities. Mild AI, mildly dilated aortic root (37 mm), trivial MR, trivial TR         Current Outpatient Prescriptions   Medication  Sig  Dispense  Refill   .  amLODipine (NORVASC) 10 MG tablet  Take 1 tablet (10 mg total) by mouth  daily.   30 tablet   11   .  apixaban (ELIQUIS) 5 MG TABS tablet  Take 5 mg by mouth 2 (two) times daily.         Marland Kitchen  atenolol (TENORMIN) 50 MG tablet  Take 25-50 mg by mouth daily. Take 50 mg every morning and 25 mg every evening         .  atorvastatin (LIPITOR) 10 MG tablet  Take 10 mg by mouth daily at 6 PM.          .  Calcium Carbonate-Vitamin D (CALTRATE 600+D PO)  Take 1 tablet by mouth daily.          .  flecainide (TAMBOCOR) 50 MG tablet  Take 1 tablet (50 mg total) by mouth 2 (two) times daily.  60 tablet   11   .  Multiple Vitamin (MULTIVITAMIN) tablet  Take 1 tablet by mouth daily.         Marland Kitchen  triamcinolone ointment (KENALOG) 0.1 %  Apply 0.1 application topically daily.             No current facility-administered medications for this visit.        Allergies:    Allergies   Allergen  Reactions   .  Penicillins  Other (See Comments)       dizzy        Social History:  The patient  reports that she has never smoked. She has never used smokeless tobacco. She reports that she does not drink alcohol or use illicit drugs.    Family History:  The patient's family history includes Cancer (age of onset: 29) in her father; Coronary artery disease in her mother; Heart attack in her mother.    ROS:  Please see the history of present illness.      All other systems reviewed and negative.     PHYSICAL EXAM: VS:  BP 120/82  Pulse 85  Ht 5\' 7"  (1.702 m)  Wt 166 lb (75.297 kg)  BMI 25.99 kg/m2 Well nourished, well developed, in no acute distress  HEENT: normal  Neck: no JVD  Cardiac:  normal S1, S2; Irregularly irregular; no murmur  Lungs:  clear to auscultation bilaterally, no wheezing, rhonchi or rales  Abd: soft, nontender, no hepatomegaly  Ext: no edema  Skin: warm and dry  Neuro:  CNs 2-12 intact, no focal abnormalities noted   EKG:  Atrial flutter with CVR      ASSESSMENT AND PLAN:   1.  Paroxysmal atrial fibrillation: Now in atrial flutter on Flecainide.  F/u ETT neg for pro-arrhythmia. She was improved symptomatically while in NSR but now with LE edema and back in atrial flutter. CHADS2-VASc=4. She requires long term anticoagulation. Continue Eliquis/Atenolol/Flecainide.  Increase Flecainide to 100mg  BID and set up for DCCV for 9/24 2.  Essential hypertension, benign: BP well controlled.  Continue current regimen of amlodipine and atenolol 3.  Mixed hyperlipidemia: Continue statin.   4.  LE edema - improved on Lasix and most likely related to recurrent PAF.  I have recommended that she continue on Lasix 20mg  daily and increase Kdur to 24meq daily.  I will check a BMET today.   Disposition: F/u with me in 6 months     Signed, Fransico Him, MD 08/19/2014 1:28 PM

## 2014-08-19 NOTE — Progress Notes (Signed)
[place], [place], [place]  [address] Phone: [phone] Fax:  [phone]  Date:  08/19/2014   ID:  Diamonte, Toms 1933-02-28, MRN [id number]  PCP:  Kandice Hams, MD  Cardiologist:  Fransico Him, MD     History of Present Illness: Christina Morgan is a 80 y.o. female with a hx of HTN, HL who was recently seen for CPE and noted to be in AFib. She was put on Eliquis by her PCP. She underwent DCCV. She was seen back in f/u 7/9 and she was back in AFib.A stress Myoview was done that was low risk. She was placed on Flecainide and underwent repeat DCCV 7/31. F/u ETT was neg for pro-arrhythmia. She returns for follow up. She is doing well.The patient denies chest pain, shortness of breath, palpitations, syncope, orthopnea or PND.  She recently has had problems with Le edema and saw her PCP and was placed on Lasix for 3 days.  An EKG was done which showed recurrent atrial fibrillation.      Wt Readings from Last 3 Encounters:  08/19/14 166 lb (75.297 kg)  07/21/14 166 lb (75.297 kg)  07/01/14 162 lb (73.483 kg)     Past Medical History  Diagnosis Date  . Cancer     endometrial ca  . Dyslipidemia   . Hypertension   . Osteopenia   . Lipoma of skin     multiple  . Hyperlipidemia   . Fracture of left humerus   . Atrial fibrillation     failed DCCV 05/2014 >>> Flecainide started >>> DCCV 7/15 sucessful;  f/u ETT neg for pro-arrhythmia   . History of cardiovascular stress test     Lexiscan Myoview (06/2014): No ischemia or scar, not gated, low risk  . Hx of echocardiogram     Echo (05/02/14): EF 60% to 65%. No regional wall motion abnormalities. Mild AI, mildly dilated aortic root (37 mm), trivial MR, trivial TR    Current Outpatient Prescriptions  Medication Sig Dispense Refill  . amLODipine (NORVASC) 10 MG tablet Take 1 tablet (10 mg total) by mouth daily.  30 tablet  11  . apixaban (ELIQUIS) 5 MG TABS tablet Take 5 mg by mouth 2 (two) times daily.      Marland Kitchen  atenolol (TENORMIN) 50 MG tablet Take 25-50 mg by mouth daily. Take 50 mg every morning and 25 mg every evening      . atorvastatin (LIPITOR) 10 MG tablet Take 10 mg by mouth daily at 6 PM.       . Calcium Carbonate-Vitamin D (CALTRATE 600+D PO) Take 1 tablet by mouth daily.       . flecainide (TAMBOCOR) 50 MG tablet Take 1 tablet (50 mg total) by mouth 2 (two) times daily.  60 tablet  11  . Multiple Vitamin (MULTIVITAMIN) tablet Take 1 tablet by mouth daily.      Marland Kitchen triamcinolone ointment (KENALOG) 0.1 % Apply 0.1 application topically daily.       No current facility-administered medications for this visit.    Allergies:    Allergies  Allergen Reactions  . Penicillins Other (See Comments)    dizzy    Social History:  The patient  reports that she has never smoked. She has never used smokeless tobacco. She reports that she does not drink alcohol or use illicit drugs.   Family History:  The patient's family history includes Cancer (age of onset: 3) in her father; Coronary artery disease in  her mother; Heart attack in her mother.   ROS:  Please see the history of present illness.      All other systems reviewed and negative.   PHYSICAL EXAM: VS:  BP 120/82  Pulse 85  Ht 5\' 7"  (1.702 m)  Wt 166 lb (75.297 kg)  BMI 25.99 kg/m2 Well nourished, well developed, in no acute distress HEENT: normal Neck: no JVD Cardiac:  normal S1, S2; Irregularly irregular; no murmur Lungs:  clear to auscultation bilaterally, no wheezing, rhonchi or rales Abd: soft, nontender, no hepatomegaly Ext: no edema Skin: warm and dry Neuro:  CNs 2-12 intact, no focal abnormalities noted  EKG:  Atrial flutter with CVR    ASSESSMENT AND PLAN:  1.  Paroxysmal atrial fibrillation: Now in atrial flutter on Flecainide. F/u ETT neg for pro-arrhythmia. She was improved symptomatically while in NSR but now with LE edema and back in atrial flutter. CHADS2-VASc=4. She requires long term anticoagulation. Continue  Eliquis/Atenolol/Flecainide.  Increase Flecainide to 100mg  BID and set up for DCCV for 9/24 2.  Essential hypertension, benign: BP well controlled.  Continue current regimen of amlodipine and atenolol 3.  Mixed hyperlipidemia: Continue statin.  4.  LE edema - improved on Lasix and most likely related to recurrent PAF.  I have recommended that she continue on Lasix 20mg  daily and increase Kdur to 54meq daily.  I will check a BMET today.  Disposition: F/u with me in 6 months   Signed, Fransico Him, MD 08/19/2014 1:28 PM

## 2014-08-19 NOTE — Patient Instructions (Addendum)
Your physician has recommended you make the following change in your medication: 1. Continue Lasix 20 MG daily 2. Increase Kdur to 20 MEQ daily 3. Increase Flecainide to 100 MG 1 tablet Twice a day  Your physician recommends that you go to the lab today for a BMET  Your physician recommends that you return for lab work for a BMET on 08/28/14  Your physician has recommended that you have a Cardioversion (DCCV). Electrical Cardioversion uses a jolt of electricity to your heart either through paddles or wired patches attached to your chest. This is a controlled, usually prescheduled, procedure. Defibrillation is done under light anesthesia in the hospital, and you usually go home the day of the procedure. This is done to get your heart back into a normal rhythm. You are not awake for the procedure. Please see the instruction sheet given to you today.(scheduled for 09/04/14 at 8:00 am with Dr Radford Pax)  Your physician wants you to follow-up in: 6 months with Dr Mallie Snooks will receive a reminder letter in the mail two months in advance. If you don't receive a letter, please call our office to schedule the follow-up appointment.

## 2014-08-20 ENCOUNTER — Encounter: Payer: Self-pay | Admitting: General Surgery

## 2014-08-21 ENCOUNTER — Encounter (HOSPITAL_COMMUNITY): Payer: Self-pay | Admitting: Pharmacy Technician

## 2014-08-22 ENCOUNTER — Telehealth: Payer: Self-pay | Admitting: Physician Assistant

## 2014-08-22 NOTE — Telephone Encounter (Signed)
Left pt a message to call back. 

## 2014-08-22 NOTE — Telephone Encounter (Signed)
The pts daughter is advised and she verbalized understanding.

## 2014-08-22 NOTE — Telephone Encounter (Signed)
Please tell Christina Morgan that her recent BPs from home are optimal. Continue with current treatment plan. Richardson Dopp, PA-C   08/22/2014 1:13 PM

## 2014-08-25 ENCOUNTER — Ambulatory Visit
Admission: RE | Admit: 2014-08-25 | Discharge: 2014-08-25 | Disposition: A | Discharge status: Home / Self Care | Payer: Medicare Other | Source: Ambulatory Visit

## 2014-08-25 DIAGNOSIS — Z1231 Encounter for screening mammogram for malignant neoplasm of breast: Secondary | ICD-10-CM

## 2014-08-28 ENCOUNTER — Other Ambulatory Visit (INDEPENDENT_AMBULATORY_CARE_PROVIDER_SITE_OTHER): Payer: Medicare Other

## 2014-08-28 DIAGNOSIS — I4891 Unspecified atrial fibrillation: Secondary | ICD-10-CM | POA: Diagnosis not present

## 2014-08-28 DIAGNOSIS — I480 Paroxysmal atrial fibrillation: Secondary | ICD-10-CM

## 2014-08-28 LAB — BASIC METABOLIC PANEL
BUN: 18 mg/dL (ref 6–23)
CO2: 29 mEq/L (ref 19–32)
Calcium: 9.2 mg/dL (ref 8.4–10.5)
Chloride: 101 mEq/L (ref 96–112)
Creatinine, Ser: 1.1 mg/dL (ref 0.4–1.2)
GFR: 51.41 mL/min — ABNORMAL LOW (ref >60.00)
Glucose, Bld: 133 mg/dL — ABNORMAL HIGH (ref 70–99)
Potassium: 3.7 mEq/L (ref 3.5–5.1)
Sodium: 139 mEq/L (ref 135–145)

## 2014-08-29 ENCOUNTER — Encounter: Payer: Self-pay | Admitting: General Surgery

## 2014-09-04 ENCOUNTER — Encounter (HOSPITAL_COMMUNITY): Payer: Medicare Other | Admitting: Anesthesiology

## 2014-09-04 ENCOUNTER — Ambulatory Visit (HOSPITAL_COMMUNITY)
Admission: RE | Admit: 2014-09-04 | Discharge: 2014-09-04 | Disposition: A | Discharge status: Home / Self Care | Payer: Medicare Other | Source: Ambulatory Visit | Attending: Cardiology | Admitting: Cardiology

## 2014-09-04 ENCOUNTER — Encounter (HOSPITAL_COMMUNITY): Payer: Self-pay | Admitting: Anesthesiology

## 2014-09-04 ENCOUNTER — Ambulatory Visit (HOSPITAL_COMMUNITY): Payer: Medicare Other | Admitting: Anesthesiology

## 2014-09-04 ENCOUNTER — Encounter (HOSPITAL_COMMUNITY)
Admission: RE | Disposition: A | Discharge status: Home / Self Care | Payer: Self-pay | Source: Ambulatory Visit | Attending: Cardiology

## 2014-09-04 DIAGNOSIS — I4891 Unspecified atrial fibrillation: Secondary | ICD-10-CM | POA: Diagnosis not present

## 2014-09-04 DIAGNOSIS — M7989 Other specified soft tissue disorders: Secondary | ICD-10-CM

## 2014-09-04 DIAGNOSIS — M899 Disorder of bone, unspecified: Secondary | ICD-10-CM | POA: Diagnosis not present

## 2014-09-04 DIAGNOSIS — Z8542 Personal history of malignant neoplasm of other parts of uterus: Secondary | ICD-10-CM | POA: Diagnosis not present

## 2014-09-04 DIAGNOSIS — M949 Disorder of cartilage, unspecified: Secondary | ICD-10-CM

## 2014-09-04 DIAGNOSIS — E782 Mixed hyperlipidemia: Secondary | ICD-10-CM | POA: Insufficient documentation

## 2014-09-04 DIAGNOSIS — I4892 Unspecified atrial flutter: Secondary | ICD-10-CM | POA: Diagnosis not present

## 2014-09-04 DIAGNOSIS — R600 Localized edema: Secondary | ICD-10-CM

## 2014-09-04 DIAGNOSIS — I10 Essential (primary) hypertension: Secondary | ICD-10-CM | POA: Diagnosis not present

## 2014-09-04 HISTORY — PX: CARDIOVERSION: SHX1299

## 2014-09-04 LAB — POCT I-STAT 4, (NA,K, GLUC, HGB,HCT)
Glucose, Bld: 126 mg/dL — ABNORMAL HIGH (ref 70–99)
HCT: 41 % (ref 36.0–46.0)
Hemoglobin: 13.9 g/dL (ref 12.0–15.0)
Potassium: 3.7 mEq/L (ref 3.7–5.3)
Sodium: 140 mEq/L (ref 137–147)

## 2014-09-04 SURGERY — CARDIOVERSION
Anesthesia: General

## 2014-09-04 MED ORDER — PROPOFOL 10 MG/ML IV BOLUS
INTRAVENOUS | Status: DC | PRN
  Administered 2014-09-04: 40 mg via INTRAVENOUS

## 2014-09-04 MED ORDER — SODIUM CHLORIDE 0.9 % IV SOLN
INTRAVENOUS | Status: DC
  Administered 2014-09-04: 07:00:00 via INTRAVENOUS

## 2014-09-04 MED ORDER — LIDOCAINE HCL (CARDIAC) 20 MG/ML IV SOLN
INTRAVENOUS | Status: DC | PRN
  Administered 2014-09-04: 40 mg via INTRAVENOUS

## 2014-09-04 MED ORDER — CEPHALEXIN 500 MG PO CAPS
500.0000 mg | ORAL_CAPSULE | Freq: Three times a day (TID) | ORAL | Status: DC
Start: 2014-09-04 — End: 2014-09-16

## 2014-09-04 NOTE — Anesthesia Preprocedure Evaluation (Signed)
Anesthesia Evaluation  Patient identified by MRN, date of birth, ID band Patient awake    Reviewed: Allergy & Precautions, H&P , NPO status , Patient's Chart, lab work & pertinent test results  Airway       Dental   Pulmonary          Cardiovascular hypertension, + dysrhythmias Atrial Fibrillation     Neuro/Psych    GI/Hepatic   Endo/Other    Renal/GU      Musculoskeletal   Abdominal   Peds  Hematology   Anesthesia Other Findings   Reproductive/Obstetrics                           Anesthesia Physical Anesthesia Plan  ASA: III  Anesthesia Plan: General   Post-op Pain Management:    Induction: Intravenous  Airway Management Planned: Mask  Additional Equipment:   Intra-op Plan:   Post-operative Plan:   Informed Consent: I have reviewed the patients History and Physical, chart, labs and discussed the procedure including the risks, benefits and alternatives for the proposed anesthesia with the patient or authorized representative who has indicated his/her understanding and acceptance.     Plan Discussed with: CRNA, Anesthesiologist and Surgeon  Anesthesia Plan Comments:         Anesthesia Quick Evaluation

## 2014-09-04 NOTE — Transfer of Care (Signed)
Immediate Anesthesia Transfer of Care Note  Patient: Christina Morgan  Procedure(s) Performed: Procedure(s): CARDIOVERSION (N/A)  Patient Location: Endoscopy Unit  Anesthesia Type:General  Level of Consciousness: awake, alert  and oriented  Airway & Oxygen Therapy: Patient Spontanous Breathing and Patient connected to nasal cannula oxygen  Post-op Assessment: Report given to PACU RN and Post -op Vital signs reviewed and stable  Post vital signs: Reviewed and stable  Complications: No apparent anesthesia complications

## 2014-09-04 NOTE — Addendum Note (Signed)
Addendum created 09/04/14 0844 by Kyung Rudd, CRNA   Modules edited: Anesthesia Responsible Staff

## 2014-09-04 NOTE — Progress Notes (Signed)
The patient tells me today that she has been having swelling in her right leg and foot for about a month.  Yesterday she shaved her legs and now her right leg at the ankle is swollen with a red rash and is warm to touch.  She denies any fever or chills.  It is unlikely that she has a DVT given that she has been on Eliquis but it is unilateral swelling.  I will get a RLE venous doppler to rule out DVT and start on Keflex 500mg  q8 hours for 10 days.  I have spoken to Dr. Delfina Redwood and he would like her seen back in his office tomorrow.  I instructed patient to call office later today for appt.  She is to call Dr. Lina Sar nurse Verline Lema at [phone].

## 2014-09-04 NOTE — CV Procedure (Signed)
   Electrical Cardioversion Procedure Note KERSTIE SONNER [id number] [birth year]    Procedure: Electrical Cardioversion Indications:  Atrial Fibrillation  Time Out: Verified patient identification, verified procedure,medications/allergies/relevent history reviewed, required imaging and test results available.  Performed  Procedure Details  The patient was NPO after midnight. Anesthesia was administered at the beside  by Dr.Smith with 40mg  of propofol and 40mg  Lidocaine.  Cardioversion was done with synchronized biphasic defibrillation with AP pads with 150watts.  The patient converted to normal sinus rhythm. The patient tolerated the procedure well   IMPRESSION:  Successful cardioversion of atrial fibrillation    TURNER,TRACI R 09/04/2014, 8:08 AM

## 2014-09-04 NOTE — Progress Notes (Signed)
VASCULAR LAB PRELIMINARY  PRELIMINARY  PRELIMINARY  PRELIMINARY  Right lower extremity venous Doppler completed.    Preliminary report:  There is no DVT or SVT noted in the right lower extremity.   Takina Busser, RVT 09/04/2014, 9:35 AM

## 2014-09-04 NOTE — Discharge Instructions (Addendum)

## 2014-09-04 NOTE — Anesthesia Postprocedure Evaluation (Signed)
  Anesthesia Post-op Note  Patient: Christina Morgan  Procedure(s) Performed: Procedure(s): CARDIOVERSION (N/A)  Patient Location: Endoscopy Unit  Anesthesia Type:MAC  Level of Consciousness: awake, alert  and oriented  Airway and Oxygen Therapy: Patient Spontanous Breathing and Patient connected to nasal cannula oxygen  Post-op Pain: none  Post-op Assessment: Post-op Vital signs reviewed, Patient's Cardiovascular Status Stable, Respiratory Function Stable and Patent Airway  Post-op Vital Signs: Reviewed and stable  Last Vitals:  Filed Vitals:   09/04/14 0821  BP: 96/46  Pulse: 54  Temp:   Resp: 14    Complications: No apparent anesthesia complications

## 2014-09-04 NOTE — Interval H&P Note (Signed)
History and Physical Interval Note:  09/04/2014 8:08 AM  Christina Morgan  has presented today for surgery, with the diagnosis of AFIB  The various methods of treatment have been discussed with the patient and family. After consideration of risks, benefits and other options for treatment, the patient has consented to  Procedure(s): CARDIOVERSION (N/A) as a surgical intervention .  The patient's history has been reviewed, patient examined, no change in status, stable for surgery.  I have reviewed the patient's chart and labs.  Questions were answered to the patient's satisfaction.     TURNER,TRACI R

## 2014-09-04 NOTE — H&P (View-Only) (Signed)
[place], [address] [place], [place]  [address] Phone: [phone] Fax:  [phone]   Date:  08/19/2014    ID:  Christina Morgan, Christina Morgan 06-02-1933, MRN [id number]   PCP:  Kandice Hams, MD          Cardiologist:  Fransico Him, MD              History of Present Illness: Christina Morgan is a 80 y.o. female with a hx of HTN, HL who was recently seen for CPE and noted to be in AFib. She was put on Eliquis by her PCP. She underwent DCCV. She was seen back in f/u 7/9 and she was back in AFib.A stress Myoview was done that was low risk. She was placed on Flecainide and underwent repeat DCCV 7/31. F/u ETT was neg for pro-arrhythmia. She returns for follow up. She is doing well.The patient denies chest pain, shortness of breath, palpitations, syncope, orthopnea or PND.  She recently has had problems with Le edema and saw her PCP and was placed on Lasix for 3 days.  An EKG was done which showed recurrent atrial fibrillation.          Wt Readings from Last 3 Encounters:   08/19/14  166 lb (75.297 kg)   07/21/14  166 lb (75.297 kg)   07/01/14  162 lb (73.483 kg)         Past Medical History   Diagnosis  Date   .  Cancer         endometrial ca   .  Dyslipidemia     .  Hypertension     .  Osteopenia     .  Lipoma of skin         multiple   .  Hyperlipidemia     .  Fracture of left humerus     .  Atrial fibrillation         failed DCCV 05/2014 >>> Flecainide started >>> DCCV 7/15 sucessful;  f/u ETT neg for pro-arrhythmia    .  History of cardiovascular stress test         Lexiscan Myoview (06/2014): No ischemia or scar, not gated, low risk   .  Hx of echocardiogram         Echo (05/02/14): EF 60% to 65%. No regional wall motion abnormalities. Mild AI, mildly dilated aortic root (37 mm), trivial MR, trivial TR         Current Outpatient Prescriptions   Medication  Sig  Dispense  Refill   .  amLODipine (NORVASC) 10 MG tablet  Take 1 tablet (10 mg total) by mouth  daily.   30 tablet   11   .  apixaban (ELIQUIS) 5 MG TABS tablet  Take 5 mg by mouth 2 (two) times daily.         Marland Kitchen  atenolol (TENORMIN) 50 MG tablet  Take 25-50 mg by mouth daily. Take 50 mg every morning and 25 mg every evening         .  atorvastatin (LIPITOR) 10 MG tablet  Take 10 mg by mouth daily at 6 PM.          .  Calcium Carbonate-Vitamin D (CALTRATE 600+D PO)  Take 1 tablet by mouth daily.          .  flecainide (TAMBOCOR) 50 MG tablet  Take 1 tablet (50 mg total) by mouth 2 (two) times daily.  60 tablet   11   .  Multiple Vitamin (MULTIVITAMIN) tablet  Take 1 tablet by mouth daily.         Marland Kitchen  triamcinolone ointment (KENALOG) 0.1 %  Apply 0.1 application topically daily.             No current facility-administered medications for this visit.        Allergies:    Allergies   Allergen  Reactions   .  Penicillins  Other (See Comments)       dizzy        Social History:  The patient  reports that she has never smoked. She has never used smokeless tobacco. She reports that she does not drink alcohol or use illicit drugs.    Family History:  The patient's family history includes Cancer (age of onset: 17) in her father; Coronary artery disease in her mother; Heart attack in her mother.    ROS:  Please see the history of present illness.      All other systems reviewed and negative.     PHYSICAL EXAM: VS:  BP 120/82  Pulse 85  Ht 5\' 7"  (1.702 m)  Wt 166 lb (75.297 kg)  BMI 25.99 kg/m2 Well nourished, well developed, in no acute distress  HEENT: normal  Neck: no JVD  Cardiac:  normal S1, S2; Irregularly irregular; no murmur  Lungs:  clear to auscultation bilaterally, no wheezing, rhonchi or rales  Abd: soft, nontender, no hepatomegaly  Ext: no edema  Skin: warm and dry  Neuro:  CNs 2-12 intact, no focal abnormalities noted   EKG:  Atrial flutter with CVR      ASSESSMENT AND PLAN:   1.  Paroxysmal atrial fibrillation: Now in atrial flutter on Flecainide.  F/u ETT neg for pro-arrhythmia. She was improved symptomatically while in NSR but now with LE edema and back in atrial flutter. CHADS2-VASc=4. She requires long term anticoagulation. Continue Eliquis/Atenolol/Flecainide.  Increase Flecainide to 100mg  BID and set up for DCCV for 9/24 2.  Essential hypertension, benign: BP well controlled.  Continue current regimen of amlodipine and atenolol 3.  Mixed hyperlipidemia: Continue statin.   4.  LE edema - improved on Lasix and most likely related to recurrent PAF.  I have recommended that she continue on Lasix 20mg  daily and increase Kdur to 25meq daily.  I will check a BMET today.   Disposition: F/u with me in 6 months     Signed, Fransico Him, MD 08/19/2014 1:28 PM

## 2014-09-04 NOTE — Anesthesia Procedure Notes (Signed)
Procedure Name: MAC Date/Time: 09/04/2014 8:12 AM Performed by: Kyung Rudd Pre-anesthesia Checklist: Patient identified, Emergency Drugs available, Suction available, Patient being monitored and Timeout performed Patient Re-evaluated:Patient Re-evaluated prior to inductionOxygen Delivery Method: Simple face mask Preoxygenation: Pre-oxygenation with 100% oxygen Intubation Type: IV induction

## 2014-09-05 ENCOUNTER — Encounter (HOSPITAL_COMMUNITY): Payer: Self-pay | Admitting: Cardiology

## 2014-09-05 DIAGNOSIS — L03119 Cellulitis of unspecified part of limb: Secondary | ICD-10-CM | POA: Diagnosis not present

## 2014-09-05 DIAGNOSIS — L02419 Cutaneous abscess of limb, unspecified: Secondary | ICD-10-CM | POA: Diagnosis not present

## 2014-09-05 DIAGNOSIS — I10 Essential (primary) hypertension: Secondary | ICD-10-CM | POA: Diagnosis not present

## 2014-09-08 ENCOUNTER — Telehealth: Payer: Self-pay | Admitting: Cardiology

## 2014-09-08 DIAGNOSIS — C549 Malignant neoplasm of corpus uteri, unspecified: Secondary | ICD-10-CM | POA: Diagnosis not present

## 2014-09-08 NOTE — Telephone Encounter (Signed)
New message    The redness from right  leg is gone, still swollen.  Will be seeing PCP this week.

## 2014-09-08 NOTE — Telephone Encounter (Signed)
TO Dr Radford Pax as a Christina Morgan

## 2014-09-08 NOTE — Telephone Encounter (Signed)
LMTRC

## 2014-09-08 NOTE — Telephone Encounter (Signed)
Is she still taking higher dose of lasix

## 2014-09-09 NOTE — Telephone Encounter (Signed)
Please have her see her PCP tomorrow

## 2014-09-09 NOTE — Telephone Encounter (Signed)
Pt took 2 tablets daily for three days and then back to 1 tablet daily (20 MG) Pt stated it was much better on the 40 MG and she is now having redness on her ankles again like before. She is wondering if she needs to be on higher dosage. Pt stated Dr Delfina Redwood asked her not to shave her legs prior to his appt with her this week and she is still getting the redness. Pt only has 4 tablets left. Wants to know if we wanna make any changes or if she needs to see her PCP?

## 2014-09-09 NOTE — Telephone Encounter (Signed)
Follow up    Want Dr Radford Pax to know that her leg is still swollen and her ankle is red.  Please call after 1pm

## 2014-09-09 NOTE — Telephone Encounter (Signed)
Pt is aware.  

## 2014-09-11 DIAGNOSIS — I4891 Unspecified atrial fibrillation: Secondary | ICD-10-CM | POA: Diagnosis not present

## 2014-09-11 DIAGNOSIS — R600 Localized edema: Secondary | ICD-10-CM | POA: Diagnosis not present

## 2014-09-16 ENCOUNTER — Ambulatory Visit (INDEPENDENT_AMBULATORY_CARE_PROVIDER_SITE_OTHER): Payer: Medicare Other | Admitting: Physician Assistant

## 2014-09-16 ENCOUNTER — Encounter: Payer: Self-pay | Admitting: Physician Assistant

## 2014-09-16 VITALS — BP 140/60 | HR 61 | Ht 67.0 in | Wt 170.0 lb

## 2014-09-16 DIAGNOSIS — I10 Essential (primary) hypertension: Secondary | ICD-10-CM | POA: Diagnosis not present

## 2014-09-16 DIAGNOSIS — I4892 Unspecified atrial flutter: Secondary | ICD-10-CM | POA: Diagnosis not present

## 2014-09-16 DIAGNOSIS — R609 Edema, unspecified: Secondary | ICD-10-CM

## 2014-09-16 DIAGNOSIS — I480 Paroxysmal atrial fibrillation: Secondary | ICD-10-CM

## 2014-09-16 DIAGNOSIS — R600 Localized edema: Secondary | ICD-10-CM

## 2014-09-16 DIAGNOSIS — E782 Mixed hyperlipidemia: Secondary | ICD-10-CM

## 2014-09-16 LAB — BASIC METABOLIC PANEL
BUN: 18 mg/dL (ref 6–23)
CO2: 31 mEq/L (ref 19–32)
Calcium: 9.2 mg/dL (ref 8.4–10.5)
Chloride: 101 mEq/L (ref 96–112)
Creatinine, Ser: 0.9 mg/dL (ref 0.4–1.2)
GFR: 67.57 mL/min (ref >60.00)
Glucose, Bld: 97 mg/dL (ref 70–99)
Potassium: 3.8 mEq/L (ref 3.5–5.1)
Sodium: 137 mEq/L (ref 135–145)

## 2014-09-16 NOTE — Patient Instructions (Addendum)
Your physician has requested that you have an exercise tolerance test FLECAINIDE DOSE INCREASED, A-FIB. For further information please visit [email or web address]. Please also follow instruction sheet, as given.  LAB WORK TODAY; BMET  Your physician recommends that you continue on your current medications as directed. Please refer to the Current Medication list given to you today.  Your physician recommends that you schedule a follow-up appointment in: [place]

## 2014-09-16 NOTE — Progress Notes (Signed)
Cardiology Office Note   Date:  09/16/2014   ID:  Devonie, Badeaux 04-03-1933, MRN [id number]  PCP:  Kandice Hams, MD  Cardiologist:  Dr. Fransico Him      History of Present Illness: Christina Morgan is a 80 y.o. female with a hx of HTN, HL who was recently seen for CPE and noted to be in AFib.  She was put on Eliquis by her PCP.  She saw Dr. Radford Pax and was set up for DCCV.  I saw her in f/u 7/9 and she was back in AFib.  I had her do a Myoview that was low risk.  She was placed on Flecainide and underwent repeat DCCV 7/31.  F/u ETT was neg for pro-arrhythmia.   She was seen in FU by Dr. Fransico Him b/c of LE edema and recurrent AFib/Flutter.  Flecainide was increased to 100 bid.  DCCV was performed 9/24.  She was noted to have unilateral (RLE) edema at the time of her DCCV.  Venous US was neg for DVT.  She was placed on Keflex to cover for cellulitis and set up for FU with primary care.    She returns for FU.  Here with her daughter.  Dr. Delfina Redwood decreased her Amlodipine and increased her Atenolol.  She is now on a lower dose of Lasix.  Redness has cleared up and her edema is improved.  She notes dyspnea with mod to extreme activities.  No chest pain.  No syncope. No orthopnea, PND.  No palpitations.     Studies:  - Echo (05/02/14): EF 60% to 65%. No regional wall motion abnormalities. Mild AI, mildly dilated aortic root (37 mm), trivial MR, trivial TR  - Myoview (06/2014):  Low risk stress nuclear study. No ischemia or scar.  - ETT (07/17/14): normal - no evidence of ischemia by ST analysis   Recent Labs: 06/19/2014: ALT 19  08/28/2014: Creatinine 1.1  09/04/2014: Hemoglobin 13.9; Potassium 3.7   Wt Readings from Last 3 Encounters:  09/16/14 170 lb (77.111 kg)  08/19/14 166 lb (75.297 kg)  07/21/14 166 lb (75.297 kg)     Past Medical History  Diagnosis Date  . Cancer     endometrial ca  . Dyslipidemia   . Hypertension   . Osteopenia   . Lipoma of skin    multiple  . Hyperlipidemia   . Fracture of left humerus   . Atrial fibrillation     failed DCCV 05/2014 >> Flecainide started >> DCCV 7/15 sucessful;  f/u ETT neg for pro-arrhythmia >> recurrent AF/AFL >> Flecainide inc to 100 bid with repeat DCCV 9/15  . History of cardiovascular stress test     Lexiscan Myoview (06/2014): No ischemia or scar, not gated, low risk  . Hx of echocardiogram     Echo (05/02/14): EF 60% to 65%. No regional wall motion abnormalities. Mild AI, mildly dilated aortic root (37 mm), trivial MR, trivial TR    Current Outpatient Prescriptions  Medication Sig Dispense Refill  . amLODipine (NORVASC) 5 MG tablet Take 5 mg by mouth daily.      Marland Kitchen apixaban (ELIQUIS) 5 MG TABS tablet Take 5 mg by mouth 2 (two) times daily.      Marland Kitchen atenolol (TENORMIN) 50 MG tablet Take 75 mg by mouth 2 (two) times daily.       Marland Kitchen atorvastatin (LIPITOR) 10 MG tablet Take 10 mg by mouth daily at 6 PM.       . Calcium Carbonate-Vitamin  D (CALTRATE 600+D PO) Take 1 tablet by mouth daily.       . flecainide (TAMBOCOR) 100 MG tablet Take 1 tablet (100 mg total) by mouth 2 (two) times daily.  60 tablet  11  . furosemide (LASIX) 20 MG tablet Take 1 tablet (20 mg total) by mouth daily.  30 tablet  6  . Multiple Vitamin (MULTIVITAMIN) tablet Take 1 tablet by mouth daily.      . potassium chloride (K-DUR) 10 MEQ tablet Take 10 mEq by mouth.       . triamcinolone ointment (KENALOG) 0.1 % Apply 0.1 application topically 2 (two) times daily.        No current facility-administered medications for this visit.    Allergies:   Penicillins   Social History:  The patient  reports that she has never smoked. She has never used smokeless tobacco. She reports that she does not drink alcohol or use illicit drugs.   Family History:  The patient's family history includes Cancer (age of onset: 48) in her father; Coronary artery disease in her mother; Heart attack in her mother.   ROS:  Please see the history of present  illness.   No bleeding problems.    All other systems reviewed and negative.   PHYSICAL EXAM: VS:  BP 140/60  Pulse 61  Ht 5\' 7"  (1.702 m)  Wt 170 lb (77.111 kg)  BMI 26.62 kg/m2 Well nourished, well developed, in no acute distress HEENT: normal Neck: no JVD Cardiac:  normal S1, S2; RRR, no murmur Lungs:  clear to auscultation bilaterally, no wheezing, rhonchi or rales Abd: soft, nontender, no hepatomegaly Ext: no edema Skin: warm and dry Neuro:  CNs 2-12 intact, no focal abnormalities noted  EKG:  NSR, HR 61, rightward axis, QRS 102, 1st degree AVB, PR 242    ASSESSMENT AND PLAN:  1.  Paroxysmal atrial fibrillation/flutter:  Maintaining NSR.  CHADS2-VASc=4.  She requires long term anticoagulation.  She is tolerating Eliquis.  Plan FU ETT to r/o pro-arrhythmia with increased dose of Flecainide.  D/w Dr. Radford Pax.  Will hold off on referral to AFib clinic for now.   2.  Essential hypertension, benign:  Fair control.  Continue current Rx.  3.  Mixed hyperlipidemia:  Continue statin.  4.  LE Edema:  Improved. Of note, she had uterine CA in the past and underwent hysterectomy.  She had LNs removed as well.  She has always had more edema on the R.  Check BMET today.    Disposition:  FU with Dr. Fransico Him in 2 mos.    Signed, Versie Starks, MHS 09/16/2014 10:05 AM    [facility] [place], [place], [place]  [address] Phone: [phone]; Fax: [phone]

## 2014-09-17 ENCOUNTER — Telehealth: Payer: Self-pay | Admitting: *Deleted

## 2014-09-17 NOTE — Telephone Encounter (Signed)
pt notified about lab results with verbal understanding  

## 2014-09-17 NOTE — Telephone Encounter (Signed)
line busy x 2

## 2014-09-25 ENCOUNTER — Telehealth (HOSPITAL_COMMUNITY): Payer: Self-pay

## 2014-09-25 NOTE — Telephone Encounter (Signed)
Encounter complete. 

## 2014-09-30 ENCOUNTER — Ambulatory Visit (HOSPITAL_COMMUNITY)
Admission: RE | Admit: 2014-09-30 | Discharge: 2014-09-30 | Disposition: A | Discharge status: Home / Self Care | Payer: Medicare Other | Source: Ambulatory Visit | Attending: Cardiovascular Disease | Admitting: Cardiovascular Disease

## 2014-09-30 DIAGNOSIS — I480 Paroxysmal atrial fibrillation: Secondary | ICD-10-CM | POA: Diagnosis not present

## 2014-09-30 DIAGNOSIS — R002 Palpitations: Secondary | ICD-10-CM | POA: Insufficient documentation

## 2014-09-30 NOTE — Procedures (Signed)
Exercise Treadmill Test  Test  Exercise Tolerance Test Ordering MD: Fransico Him, MD  Interpreting MD:   Unique Test No: 1  Treadmill:  1  Indication for ETT: Palpitations-Evaluate for Ischemia  Contraindication to ETT: No   Stress Modality: exercise - treadmill  Cardiac Imaging Performed: non   Protocol: standard Bruce - maximal  Max BP:  210/92  Max MPHR (bpm): 141 85% MPR (bpm):120  MPHR obtained (bpm):  123 % MPHR obtained: 87  Reached 85% MPHR (min:sec):5:20 Total Exercise Time (min-sec):  5:27  Workload in METS:  7.00 Borg Scale:   Reason ETT Terminated:  fatigue    ST Segment Analysis At Rest: NSR, RAD With Exercise: no evidence of significant ST depression  Other Information Arrhythmia:  No Angina during ETT:  absent (0) Quality of ETT:  diagnostic  ETT Interpretation:  normal - no evidence of ischemia by ST analysis  Comments: ETT with fair exercise tolerance; hypertensive blood pressure response; no chest pain; no ST changes; no arrhythmias; negative adequate ETT.  Kirk Ruths

## 2014-10-01 ENCOUNTER — Telehealth: Payer: Self-pay | Admitting: *Deleted

## 2014-10-01 DIAGNOSIS — H2513 Age-related nuclear cataract, bilateral: Secondary | ICD-10-CM | POA: Diagnosis not present

## 2014-10-01 NOTE — Telephone Encounter (Signed)
ptcb and has been notified about myoview results with verbal understanding

## 2014-10-01 NOTE — Telephone Encounter (Signed)
lmptcb for stress test results

## 2014-10-13 ENCOUNTER — Encounter: Payer: Self-pay | Admitting: Physician Assistant

## 2014-10-21 DIAGNOSIS — I10 Essential (primary) hypertension: Secondary | ICD-10-CM | POA: Diagnosis not present

## 2014-10-21 DIAGNOSIS — Z1389 Encounter for screening for other disorder: Secondary | ICD-10-CM | POA: Diagnosis not present

## 2014-10-21 DIAGNOSIS — I480 Paroxysmal atrial fibrillation: Secondary | ICD-10-CM | POA: Diagnosis not present

## 2014-10-21 DIAGNOSIS — E782 Mixed hyperlipidemia: Secondary | ICD-10-CM | POA: Diagnosis not present

## 2014-10-21 DIAGNOSIS — C55 Malignant neoplasm of uterus, part unspecified: Secondary | ICD-10-CM | POA: Diagnosis not present

## 2014-10-21 DIAGNOSIS — Z0000 Encounter for general adult medical examination without abnormal findings: Secondary | ICD-10-CM | POA: Diagnosis not present

## 2014-10-21 DIAGNOSIS — Z23 Encounter for immunization: Secondary | ICD-10-CM | POA: Diagnosis not present

## 2014-10-30 ENCOUNTER — Ambulatory Visit: Payer: Medicare Other | Admitting: Cardiology

## 2014-11-18 ENCOUNTER — Encounter: Payer: Self-pay | Admitting: Cardiology

## 2014-11-18 ENCOUNTER — Ambulatory Visit (INDEPENDENT_AMBULATORY_CARE_PROVIDER_SITE_OTHER): Payer: Medicare Other | Admitting: Cardiology

## 2014-11-18 VITALS — BP 146/72 | HR 63 | Ht 67.0 in | Wt 168.0 lb

## 2014-11-18 DIAGNOSIS — R600 Localized edema: Secondary | ICD-10-CM

## 2014-11-18 DIAGNOSIS — I4892 Unspecified atrial flutter: Secondary | ICD-10-CM | POA: Diagnosis not present

## 2014-11-18 DIAGNOSIS — E782 Mixed hyperlipidemia: Secondary | ICD-10-CM

## 2014-11-18 DIAGNOSIS — I10 Essential (primary) hypertension: Secondary | ICD-10-CM | POA: Diagnosis not present

## 2014-11-18 DIAGNOSIS — R609 Edema, unspecified: Secondary | ICD-10-CM

## 2014-11-18 DIAGNOSIS — I480 Paroxysmal atrial fibrillation: Secondary | ICD-10-CM | POA: Diagnosis not present

## 2014-11-18 NOTE — Progress Notes (Signed)
[place], [place], [place]  [address] Phone: [phone] Fax:  [phone]  Date:  11/18/2014   ID:  Chantha, Crozier 1933-09-04, MRN [id number]  PCP:  Kandice Hams, MD  Cardiologist:  Fransico Him, MD    History of Present Illness: Christina Morgan is a 80 y.o. female with a hx of HTN, HL and PAF on systemic anticoagulation.  She presents for followup of her PAF today.   When I last saw her she was back in atrial flutter on flecainide.  Her flecainide was increased and she underwent DCCV to NSR.  She was placed on Lasix for LE edema which resolved.   She underwent ETT to rule out exercise induced arrhythmias on higher dose of Flecainide and it showed no arrhythmia or ischemia.  She has not had any further breakthrough of PAF.  She is doing well.The patient denies chest pain, shortness of breath, palpitations, syncope, orthopnea or PND.Her LE edema is significantly better.  Wt Readings from Last 3 Encounters:  11/18/14 168 lb (76.204 kg)  09/16/14 170 lb (77.111 kg)  08/19/14 166 lb (75.297 kg)     Past Medical History  Diagnosis Date  . Cancer     endometrial ca  . Dyslipidemia   . Hypertension   . Osteopenia   . Lipoma of skin     multiple  . Hyperlipidemia   . Fracture of left humerus   . Atrial fibrillation     failed DCCV 05/2014 >> Flecainide started >> DCCV 7/15 sucessful;  f/u ETT neg for pro-arrhythmia >> recurrent AF/AFL >> Flecainide inc to 100 bid with repeat DCCV 9/15  . History of cardiovascular stress test     Lexiscan Myoview (06/2014): No ischemia or scar, not gated, low risk  . Hx of echocardiogram     Echo (05/02/14): EF 60% to 65%. No regional wall motion abnormalities. Mild AI, mildly dilated aortic root (37 mm), trivial MR, trivial TR    Current Outpatient Prescriptions  Medication Sig Dispense Refill  . amLODipine (NORVASC) 5 MG tablet Take 5 mg by mouth daily.    Marland Kitchen apixaban (ELIQUIS) 5 MG TABS tablet Take 5 mg by mouth 2  (two) times daily.    Marland Kitchen atenolol (TENORMIN) 50 MG tablet Take 75 mg by mouth 2 (two) times daily.     Marland Kitchen atorvastatin (LIPITOR) 10 MG tablet Take 10 mg by mouth daily at 6 PM.     . Calcium Carbonate-Vitamin D (CALTRATE 600+D PO) Take 1 tablet by mouth daily.     . flecainide (TAMBOCOR) 100 MG tablet Take 1 tablet (100 mg total) by mouth 2 (two) times daily. 60 tablet 11  . furosemide (LASIX) 20 MG tablet Take 1 tablet (20 mg total) by mouth daily. 30 tablet 6  . Multiple Vitamin (MULTIVITAMIN) tablet Take 1 tablet by mouth daily.    . potassium chloride (K-DUR) 10 MEQ tablet Take 10 mEq by mouth.     . triamcinolone ointment (KENALOG) 0.1 % Apply 0.1 application topically 2 (two) times daily.      No current facility-administered medications for this visit.    Allergies:    Allergies  Allergen Reactions  . Penicillins Other (See Comments)    dizzy    Social History:  The patient  reports that she has never smoked. She has never used smokeless tobacco. She reports that she does not drink alcohol or use illicit drugs.   Family History:  The patient's family  history includes Cancer (age of onset: 76) in her father; Coronary artery disease in her mother; Heart attack in her mother.   ROS:  Please see the history of present illness.      All other systems reviewed and negative.   PHYSICAL EXAM: VS:  BP 146/72 mmHg  Pulse 63  Ht 5\' 7"  (1.702 m)  Wt 168 lb (76.204 kg)  BMI 26.31 kg/m2  SpO2 98% Well nourished, well developed, in no acute distress HEENT: normal Neck: no JVD Cardiac:  normal S1, S2; RRR; no murmur Lungs:  clear to auscultation bilaterally, no wheezing, rhonchi or rales Abd: soft, nontender, no hepatomegaly Ext: no edema Skin: warm and dry Neuro:  CNs 2-12 intact, no focal abnormalities noted     ASSESSMENT AND PLAN:  1. Paroxysmal atrial fibrillation/flutter: Maintaining NSR. CHADS2-VASc=4. She requires long term anticoagulation. She is tolerating Eliquis.  Continue Eliquis/Tenormin and flecainide  2. Essential hypertension, benign: Fair control. Continue current Rx.  3. Mixed hyperlipidemia: Continue statin.  4. LE Edema: Improved.  Continue diuretic  Followup with me in 6 months   Signed, Fransico Him, MD [facility] 11/18/2014 1:53 PM

## 2014-11-18 NOTE — Patient Instructions (Signed)
Your physician recommends that you continue on your current medications as directed. Please refer to the Current Medication list given to you today.  Your physician wants you to follow-up in: 6 months with Dr Turner You will receive a reminder letter in the mail two months in advance. If you don't receive a letter, please call our office to schedule the follow-up appointment.  

## 2014-12-17 DIAGNOSIS — Z79899 Other long term (current) drug therapy: Secondary | ICD-10-CM | POA: Diagnosis not present

## 2014-12-17 DIAGNOSIS — Z9079 Acquired absence of other genital organ(s): Secondary | ICD-10-CM | POA: Diagnosis not present

## 2014-12-17 DIAGNOSIS — Z8542 Personal history of malignant neoplasm of other parts of uterus: Secondary | ICD-10-CM | POA: Diagnosis not present

## 2014-12-17 DIAGNOSIS — I4891 Unspecified atrial fibrillation: Secondary | ICD-10-CM | POA: Diagnosis not present

## 2014-12-17 DIAGNOSIS — K5900 Constipation, unspecified: Secondary | ICD-10-CM | POA: Diagnosis not present

## 2014-12-17 DIAGNOSIS — Z90722 Acquired absence of ovaries, bilateral: Secondary | ICD-10-CM | POA: Diagnosis not present

## 2014-12-17 DIAGNOSIS — E785 Hyperlipidemia, unspecified: Secondary | ICD-10-CM | POA: Diagnosis not present

## 2014-12-17 DIAGNOSIS — Z08 Encounter for follow-up examination after completed treatment for malignant neoplasm: Secondary | ICD-10-CM | POA: Diagnosis not present

## 2014-12-17 DIAGNOSIS — Z808 Family history of malignant neoplasm of other organs or systems: Secondary | ICD-10-CM | POA: Diagnosis not present

## 2014-12-17 DIAGNOSIS — M8580 Other specified disorders of bone density and structure, unspecified site: Secondary | ICD-10-CM | POA: Diagnosis not present

## 2014-12-17 DIAGNOSIS — Z90710 Acquired absence of both cervix and uterus: Secondary | ICD-10-CM | POA: Diagnosis not present

## 2014-12-17 DIAGNOSIS — I10 Essential (primary) hypertension: Secondary | ICD-10-CM | POA: Diagnosis not present

## 2014-12-17 DIAGNOSIS — Z880 Allergy status to penicillin: Secondary | ICD-10-CM | POA: Diagnosis not present

## 2014-12-17 DIAGNOSIS — C541 Malignant neoplasm of endometrium: Secondary | ICD-10-CM | POA: Diagnosis not present

## 2015-02-18 DIAGNOSIS — I4891 Unspecified atrial fibrillation: Secondary | ICD-10-CM | POA: Diagnosis not present

## 2015-02-18 DIAGNOSIS — Z1211 Encounter for screening for malignant neoplasm of colon: Secondary | ICD-10-CM | POA: Diagnosis not present

## 2015-04-22 ENCOUNTER — Encounter: Payer: Self-pay | Admitting: Cardiology

## 2015-04-22 DIAGNOSIS — E2839 Other primary ovarian failure: Secondary | ICD-10-CM | POA: Diagnosis not present

## 2015-04-22 DIAGNOSIS — I10 Essential (primary) hypertension: Secondary | ICD-10-CM | POA: Diagnosis not present

## 2015-04-22 DIAGNOSIS — R5383 Other fatigue: Secondary | ICD-10-CM | POA: Diagnosis not present

## 2015-04-22 DIAGNOSIS — I4891 Unspecified atrial fibrillation: Secondary | ICD-10-CM | POA: Diagnosis not present

## 2015-04-22 DIAGNOSIS — E782 Mixed hyperlipidemia: Secondary | ICD-10-CM | POA: Diagnosis not present

## 2015-05-18 ENCOUNTER — Ambulatory Visit: Payer: Medicare Other | Admitting: Cardiology

## 2015-05-20 ENCOUNTER — Encounter (HOSPITAL_COMMUNITY): Payer: Self-pay | Admitting: Gastroenterology

## 2015-05-20 ENCOUNTER — Ambulatory Visit (HOSPITAL_COMMUNITY)
Admission: RE | Admit: 2015-05-20 | Discharge: 2015-05-20 | Disposition: A | Discharge status: Home / Self Care | Payer: Medicare Other | Source: Ambulatory Visit | Attending: Gastroenterology | Admitting: Gastroenterology

## 2015-05-20 ENCOUNTER — Encounter (HOSPITAL_COMMUNITY)
Admission: RE | Disposition: A | Discharge status: Home / Self Care | Payer: Self-pay | Source: Ambulatory Visit | Attending: Gastroenterology

## 2015-05-20 DIAGNOSIS — K635 Polyp of colon: Secondary | ICD-10-CM | POA: Diagnosis not present

## 2015-05-20 DIAGNOSIS — K5730 Diverticulosis of large intestine without perforation or abscess without bleeding: Secondary | ICD-10-CM | POA: Diagnosis not present

## 2015-05-20 DIAGNOSIS — I10 Essential (primary) hypertension: Secondary | ICD-10-CM | POA: Diagnosis not present

## 2015-05-20 DIAGNOSIS — I4891 Unspecified atrial fibrillation: Secondary | ICD-10-CM | POA: Diagnosis not present

## 2015-05-20 DIAGNOSIS — E785 Hyperlipidemia, unspecified: Secondary | ICD-10-CM | POA: Insufficient documentation

## 2015-05-20 DIAGNOSIS — Z8542 Personal history of malignant neoplasm of other parts of uterus: Secondary | ICD-10-CM | POA: Insufficient documentation

## 2015-05-20 DIAGNOSIS — D120 Benign neoplasm of cecum: Secondary | ICD-10-CM | POA: Diagnosis not present

## 2015-05-20 DIAGNOSIS — Z1211 Encounter for screening for malignant neoplasm of colon: Secondary | ICD-10-CM | POA: Insufficient documentation

## 2015-05-20 DIAGNOSIS — Z79899 Other long term (current) drug therapy: Secondary | ICD-10-CM | POA: Diagnosis not present

## 2015-05-20 DIAGNOSIS — M8580 Other specified disorders of bone density and structure, unspecified site: Secondary | ICD-10-CM | POA: Diagnosis not present

## 2015-05-20 DIAGNOSIS — D122 Benign neoplasm of ascending colon: Secondary | ICD-10-CM | POA: Diagnosis not present

## 2015-05-20 HISTORY — PX: COLONOSCOPY: SHX5424

## 2015-05-20 SURGERY — COLONOSCOPY
Anesthesia: Moderate Sedation

## 2015-05-20 MED ORDER — FENTANYL CITRATE (PF) 100 MCG/2ML IJ SOLN
INTRAMUSCULAR | Status: DC | PRN
  Administered 2015-05-20: 25 ug via INTRAVENOUS
  Administered 2015-05-20 (×2): 12.5 ug via INTRAVENOUS

## 2015-05-20 MED ORDER — SODIUM CHLORIDE 0.9 % IV SOLN: INTRAVENOUS | Status: DC

## 2015-05-20 MED ORDER — DIPHENHYDRAMINE HCL 50 MG/ML IJ SOLN
INTRAMUSCULAR | Status: AC
  Filled 2015-05-20: qty 1

## 2015-05-20 MED ORDER — MIDAZOLAM HCL 5 MG/ML IJ SOLN
INTRAMUSCULAR | Status: AC
  Filled 2015-05-20: qty 2

## 2015-05-20 MED ORDER — LACTATED RINGERS IV SOLN
INTRAVENOUS | Status: DC
  Administered 2015-05-20: 125 mL/h via INTRAVENOUS

## 2015-05-20 MED ORDER — FENTANYL CITRATE (PF) 100 MCG/2ML IJ SOLN
INTRAMUSCULAR | Status: AC
  Filled 2015-05-20: qty 2

## 2015-05-20 MED ORDER — MIDAZOLAM HCL 5 MG/5ML IJ SOLN
INTRAMUSCULAR | Status: DC | PRN
  Administered 2015-05-20: 2 mg via INTRAVENOUS
  Administered 2015-05-20 (×2): 1 mg via INTRAVENOUS

## 2015-05-20 NOTE — H&P (Signed)
Christina Morgan is an 81 y.o. female.   Chief Complaint: colon cancer screening  HPI: Pt has no worrisome risk factors or Sx.  Colonoscopy 2005 (Dr. Sammuel Cooper) neg exc sigmoid tics.  Past Medical History  Diagnosis Date  . Cancer     endometrial ca  . Dyslipidemia   . Hypertension   . Osteopenia   . Lipoma of skin     multiple  . Hyperlipidemia   . Fracture of left humerus   . Atrial fibrillation     failed DCCV 05/2014 >> Flecainide started >> DCCV 7/15 sucessful;  f/u ETT neg for pro-arrhythmia >> recurrent AF/AFL >> Flecainide inc to 100 bid with repeat DCCV 9/15  . History of cardiovascular stress test     Lexiscan Myoview (06/2014): No ischemia or scar, not gated, low risk  . Hx of echocardiogram     Echo (05/02/14): EF 60% to 65%. No regional wall motion abnormalities. Mild AI, mildly dilated aortic root (37 mm), trivial MR, trivial TR    Past Surgical History  Procedure Laterality Date  . Colonoscopy  2008  . Robotic assisted supracervical hysterectomy with bilateral salpingo oopherectomy  11/06/2012    and bilateral pelvic lymph node dissection  . Cardioversion N/A 06/05/2014    Procedure: CARDIOVERSION;  Surgeon: Sueanne Margarita, MD;  Location: [place];  Service: Cardiovascular;  Laterality: N/A;  . Cardioversion N/A 07/11/2014    Procedure: CARDIOVERSION;  Surgeon: Sueanne Margarita, MD;  Location: [facility] ENDOSCOPY;  Service: Cardiovascular;  Laterality: N/A;  . Cardioversion N/A 09/04/2014    Procedure: CARDIOVERSION;  Surgeon: Sueanne Margarita, MD;  Location: [facility] ENDOSCOPY;  Service: Cardiovascular;  Laterality: N/A;    Family History  Problem Relation Age of Onset  . Cancer Father 35    metastatic oropharyngeal ca  . Heart attack Mother   . Coronary artery disease Mother    Social History:  reports that she has never smoked. She has never used smokeless tobacco. She reports that she does not drink alcohol or use illicit drugs.  Allergies:  Allergies  Allergen  Reactions  . Penicillins Other (See Comments)    dizzy    Medications Prior to Admission  Medication Sig Dispense Refill  . amLODipine (NORVASC) 5 MG tablet Take 5 mg by mouth daily.    Marland Kitchen atenolol (TENORMIN) 50 MG tablet Take 75 mg by mouth 2 (two) times daily.     Marland Kitchen atorvastatin (LIPITOR) 10 MG tablet Take 10 mg by mouth daily at 6 PM.     . Calcium Carbonate-Vitamin D (CALTRATE 600+D PO) Take 1 tablet by mouth daily.     . flecainide (TAMBOCOR) 100 MG tablet Take 1 tablet (100 mg total) by mouth 2 (two) times daily. 60 tablet 11  . furosemide (LASIX) 20 MG tablet Take 1 tablet (20 mg total) by mouth daily. 30 tablet 6  . Multiple Vitamin (MULTIVITAMIN) tablet Take 1 tablet by mouth daily.    . potassium chloride (K-DUR) 10 MEQ tablet Take 10 mEq by mouth.     . triamcinolone ointment (KENALOG) 0.1 % Apply 0.1 application topically 2 (two) times daily.     Marland Kitchen apixaban (ELIQUIS) 5 MG TABS tablet Take 5 mg by mouth 2 (two) times daily.      No results found for this or any previous visit (from the past 48 hour(s)). No results found.  ROS   Denies chest pn, SOB, bowel sx  Blood pressure 177/68, pulse 58, temperature 97.7 F (36.5  C), temperature source Oral, resp. rate 16, SpO2 100 %. Physical Exam   Appears healthy, chest clear, heart without murmur, abd slt obese but nontender.  Assessment/Plan Appropriate for screening colonoscopy which will be done today under standard sedation with the patient off Eliquis for the past 48hr.  Azoria Abbett V 05/20/2015, 9:42 AM

## 2015-05-20 NOTE — Discharge Instructions (Signed)
Colonoscopy, Care After °These instructions give you information on caring for yourself after your procedure. Your doctor may also give you more specific instructions. Call your doctor if you have any problems or questions after your procedure. °HOME CARE °· Do not drive for 24 hours. °· Do not sign important papers or use machinery for 24 hours. °· You may shower. °· You may go back to your usual activities, but go slower for the first 24 hours. °· Take rest breaks often during the first 24 hours. °· Walk around or use warm packs on your belly (abdomen) if you have belly cramping or gas. °· Drink enough fluids to keep your pee (urine) clear or pale yellow. °· Resume your normal diet. Avoid heavy or fried foods. °· Avoid drinking alcohol for 24 hours or as told by your doctor. °· Only take medicines as told by your doctor. °If a tissue sample (biopsy) was taken during the procedure:  °· Do not take aspirin or blood thinners for 7 days, or as told by your doctor. °· Do not drink alcohol for 7 days, or as told by your doctor. °· Eat soft foods for the first 24 hours. °GET HELP IF: °You still have a small amount of blood in your poop (stool) 2-3 days after the procedure. °GET HELP RIGHT AWAY IF: °· You have more than a small amount of blood in your poop. °· You see clumps of tissue (blood clots) in your poop. °· Your belly is puffy (swollen). °· You feel sick to your stomach (nauseous) or throw up (vomit). °· You have a fever. °· You have belly pain that gets worse and medicine does not help. °MAKE SURE YOU: °· Understand these instructions. °· Will watch your condition. °· Will get help right away if you are not doing well or get worse. °Document Released: 12/31/2010 Document Revised: 12/03/2013 Document Reviewed: 08/05/2013 °ExitCare® Patient Information ©2015 [employer]. This information is not intended to replace advice given to you by your health care provider. Make sure you discuss any questions you have with  your health care provider. ° °

## 2015-05-20 NOTE — Op Note (Signed)
[place], [address]   COLONOSCOPY PROCEDURE REPORT  PATIENT: Christina Morgan, Christina Morgan  MR#: [id number] BIRTHDATE: May 16, 1933 , 33  yrs. old GENDER: female ENDOSCOPIST: Ronald Lobo, MD REFERRED BY:  Dr. .Polly Cobia [facility]), Dr. Maudry Mayhew PROCEDURE DATE:  06/13/2015 PROCEDURE:   colonoscopy with polypectomy ASA CLASS:   III INDICATIONS:screening for colon cancer in an 81 year old female with history of endometrial cancer. Last screening colonoscopy was in November 2005 by Dr. Mindi Curling MEDICATIONS: fentanyl 50 g, Versed 4 mg IV  DESCRIPTION OF PROCEDURE:   After the risks and benefits and of the procedure were explained, informed consent was obtained.  She came as an outpatient to the [facility] long endoscopy unit and time out was performed. The patient had been off her Eliquis for 2 days prior to this procedure.  She remained stable from the sedation perspective throughout the procedure.  Digital exam was unremarkable.         The Pentax Adult Colonscope [id number]  endoscope was introduced through the anus and advanced to the cecum without difficulty. The cecum was identified by visualization of the appendiceal orifice and ileal cecal valve.      .  The quality of the prep was excellent, and it is felt that all areas were well seen      .  The instrument was then slowly withdrawn as the colon was fully examined. Estimated blood loss is zero unless otherwise noted in this procedure report.   A 6 mm sessile polyp was found in the proximal ascending colon, and removed by cold snare technique with minimal transient oozing. it was successfully retrieved by suctioning through the scope. No other polyps were observed, and there were no masses, colitis, or vascular malformations.  There was mild to moderate sigmoid diverticulosis.    Retroflexion in the rectum and reinspection of the rectum were normal.  The scope  was then withdrawn from the patient and the procedure completed.  WITHDRAWAL TIME:  COMPLICATIONS: There were no immediate complications.  ENDOSCOPIC IMPRESSION: 1. Small sessile polyp removed from proximal colon as described above. 2. Moderate sigmoid diverticulosis.   RECOMMENDATIONS: Await pathology on the polyp, but unless high-grade dysplasia is present (doubt), I do not feel that further evaluation in the future for routine surveillance purposes would be necessary, taking into account the patient's age and the favorable findings on the current examination.  REPEAT EXAM: not necessary, in view of age  cc:  _______________________________ eSignedRonald Lobo, MD June 13, 2015 11:00 AM   CPT CODES: ICD CODES:  The ICD and CPT codes recommended by this software are interpretations from the data that the clinical staff has captured with the software.  The verification of the translation of this report to the ICD and CPT codes and modifiers is the sole responsibility of the health care institution and practicing physician where this report was generated.  [employer] will not be held responsible for the validity of the ICD and CPT codes included on this report.  AMA assumes no liability for data contained or not contained herein. CPT is a Designer, television/film set of the [employer].   PATIENT NAME:  Christina Morgan, Christina Morgan MR#: [id number]

## 2015-05-21 ENCOUNTER — Encounter (HOSPITAL_COMMUNITY): Payer: Self-pay | Admitting: Gastroenterology

## 2015-06-23 ENCOUNTER — Other Ambulatory Visit: Payer: Self-pay | Admitting: Physician Assistant

## 2015-07-06 ENCOUNTER — Other Ambulatory Visit: Payer: Self-pay | Admitting: Cardiology

## 2015-07-08 ENCOUNTER — Ambulatory Visit: Payer: Medicare Other | Admitting: Cardiology

## 2015-07-08 DIAGNOSIS — C541 Malignant neoplasm of endometrium: Secondary | ICD-10-CM | POA: Diagnosis not present

## 2015-07-08 DIAGNOSIS — Z08 Encounter for follow-up examination after completed treatment for malignant neoplasm: Secondary | ICD-10-CM | POA: Diagnosis not present

## 2015-07-08 DIAGNOSIS — Z90722 Acquired absence of ovaries, bilateral: Secondary | ICD-10-CM | POA: Diagnosis not present

## 2015-07-08 DIAGNOSIS — Z90710 Acquired absence of both cervix and uterus: Secondary | ICD-10-CM | POA: Diagnosis not present

## 2015-07-08 DIAGNOSIS — Z8542 Personal history of malignant neoplasm of other parts of uterus: Secondary | ICD-10-CM | POA: Diagnosis not present

## 2015-07-12 NOTE — Progress Notes (Signed)
Cardiology Office Note   Date:  07/13/2015   ID:  Christina, Morgan Sep 20, 1933, MRN [id number]  PCP:  Kandice Hams, MD    Chief Complaint  Patient presents with  . Follow-up    Atrial fib      History of Present Illness: Christina Morgan is an 81 y.o. female with a hx of HTN, HL and PAF on systemic anticoagulation. She presents for followup of her PAF today.She has not had any further breakthrough of PAF. She is doing well.The patient denies chest pain, shortness of breath, palpitations, syncope, orthopnea or PND.  She has chronic LE edema which is better first thing in the am.     Past Medical History  Diagnosis Date  . Cancer     endometrial ca  . Dyslipidemia   . Hypertension   . Osteopenia   . Lipoma of skin     multiple  . Hyperlipidemia   . Fracture of left humerus   . Atrial fibrillation     failed DCCV 05/2014 >> Flecainide started >> DCCV 7/15 sucessful;  f/u ETT neg for pro-arrhythmia >> recurrent AF/AFL >> Flecainide inc to 100 bid with repeat DCCV 9/15  . History of cardiovascular stress test     Lexiscan Myoview (06/2014): No ischemia or scar, not gated, low risk  . Hx of echocardiogram     Echo (05/02/14): EF 60% to 65%. No regional wall motion abnormalities. Mild AI, mildly dilated aortic root (37 mm), trivial MR, trivial TR    Past Surgical History  Procedure Laterality Date  . Colonoscopy  2008  . Robotic assisted supracervical hysterectomy with bilateral salpingo oopherectomy  11/06/2012    and bilateral pelvic lymph node dissection  . Cardioversion N/A 06/05/2014    Procedure: CARDIOVERSION;  Surgeon: Christina Margarita, MD;  Location: [place];  Service: Cardiovascular;  Laterality: N/A;  . Cardioversion N/A 07/11/2014    Procedure: CARDIOVERSION;  Surgeon: Christina Margarita, MD;  Location: [place];  Service: Cardiovascular;  Laterality: N/A;  . Cardioversion N/A 09/04/2014    Procedure: CARDIOVERSION;  Surgeon: Christina Margarita, MD;  Location: [facility] ENDOSCOPY;  Service: Cardiovascular;  Laterality: N/A;  . Colonoscopy N/A 05/20/2015    Procedure: COLONOSCOPY;  Surgeon: Ronald Lobo, MD;  Location: [facility] ENDOSCOPY;  Service: Endoscopy;  Laterality: N/A;     Current Outpatient Prescriptions  Medication Sig Dispense Refill  . amLODipine (NORVASC) 5 MG tablet TAKE 1 TABLET BY MOUTH DAILY 30 tablet 3  . apixaban (ELIQUIS) 5 MG TABS tablet Take 5 mg by mouth 2 (two) times daily.    Marland Kitchen atenolol (TENORMIN) 50 MG tablet Take 50 mg by mouth 2 (two) times daily.    Marland Kitchen atorvastatin (LIPITOR) 10 MG tablet Take 10 mg by mouth daily at 6 PM.     . Calcium Carbonate-Vitamin D (CALTRATE 600+D PO) Take 1 tablet by mouth daily.     . flecainide (TAMBOCOR) 100 MG tablet Take 1 tablet (100 mg total) by mouth 2 (two) times daily. 60 tablet 11  . furosemide (LASIX) 20 MG tablet Take 1 tablet (20 mg total) by mouth daily. 30 tablet 6  . Multiple Vitamin (MULTIVITAMIN) tablet Take 1 tablet by mouth daily.    . potassium chloride (K-DUR) 10 MEQ tablet Take 10 mEq by mouth.     . triamcinolone ointment (KENALOG) 0.1 % Apply 0.1 application topically 2 (two)  times daily.      No current facility-administered medications for this visit.    Allergies:   Penicillins    Social History:  The patient  reports that she has never smoked. She has never used smokeless tobacco. She reports that she does not drink alcohol or use illicit drugs.   Family History:  The patient's family history includes Cancer (age of onset: 33) in her father; Coronary artery disease in her mother; Heart attack in her mother.    ROS:  Please see the history of present illness.   Otherwise, review of systems are positive for none.   All other systems are reviewed and negative.    PHYSICAL EXAM: VS:  BP 176/68 mmHg  Pulse 64  Ht 5\' 7"  (1.702 m)  Wt 176 lb 12.8 oz (80.196 kg)  BMI 27.68 kg/m2  SpO2 97% , BMI Body mass index is 27.68 kg/(m^2). GEN: Well  nourished, well developed, in no acute distress HEENT: normal Neck: no JVD, carotid bruits, or masses Cardiac: RRR; no murmurs, rubs, or gallops.  1-2+ edema RLE Respiratory:  clear to auscultation bilaterally, normal work of breathing GI: soft, nontender, nondistended, + BS MS: no deformity or atrophy Skin: warm and dry, no rash Neuro:  Strength and sensation are intact Psych: euthymic mood, full affect   EKG:  EKG is not ordered today.    Recent Labs: 09/04/2014: Hemoglobin 13.9 09/16/2014: BUN 18; Creatinine, Ser 0.9; Potassium 3.8; Sodium 137    Lipid Panel No results found for: CHOL, TRIG, HDL, CHOLHDL, VLDL, LDLCALC, LDLDIRECT    Wt Readings from Last 3 Encounters:  07/13/15 176 lb 12.8 oz (80.196 kg)  11/18/14 168 lb (76.204 kg)  09/16/14 170 lb (77.111 kg)     ASSESSMENT AND PLAN:  1. Paroxysmal atrial fibrillation/flutter: Maintaining NSR. CHADS2-VASc=4. She requires long term anticoagulation. She is tolerating Eliquis. Continue Eliquis/Tenormin and flecainide. Check NOAC   2. Essential hypertension, benign: Borderline control but was at a funeral this am.   Continue current Rx.  I have asked her to check her BP daily for a week and call with the results.    3. Mixed hyperlipidemia: Continue statin.  4. LE Edema: Edema appears worse.  Her right leg is more swollen than the left due to prior lymph node resection in her groin. .Increase Lasix to 40mg  daily and potassium 20 meq daily..  I will get a BMET in 1 week     Current medicines are reviewed at length with the patient today.  The patient does not have concerns regarding medicines.  The following changes have been made:  no change  Labs/ tests ordered today: See above Assessment and Plan No orders of the defined types were placed in this encounter.     Disposition:   FU with me in 6 months  Signed, Christina Margarita, MD  07/13/2015 4:09 PM    [facility] [address], [place], [place]  [address] Phone: [phone]; Fax: [phone]

## 2015-07-13 ENCOUNTER — Ambulatory Visit (INDEPENDENT_AMBULATORY_CARE_PROVIDER_SITE_OTHER): Payer: Medicare Other | Admitting: Cardiology

## 2015-07-13 ENCOUNTER — Encounter: Payer: Self-pay | Admitting: Cardiology

## 2015-07-13 ENCOUNTER — Other Ambulatory Visit: Payer: Self-pay | Admitting: Cardiology

## 2015-07-13 VITALS — BP 176/68 | HR 64 | Ht 67.0 in | Wt 176.8 lb

## 2015-07-13 DIAGNOSIS — I4892 Unspecified atrial flutter: Secondary | ICD-10-CM | POA: Diagnosis not present

## 2015-07-13 DIAGNOSIS — I480 Paroxysmal atrial fibrillation: Secondary | ICD-10-CM | POA: Diagnosis not present

## 2015-07-13 DIAGNOSIS — E782 Mixed hyperlipidemia: Secondary | ICD-10-CM | POA: Diagnosis not present

## 2015-07-13 DIAGNOSIS — R609 Edema, unspecified: Secondary | ICD-10-CM

## 2015-07-13 DIAGNOSIS — I10 Essential (primary) hypertension: Secondary | ICD-10-CM

## 2015-07-13 DIAGNOSIS — R600 Localized edema: Secondary | ICD-10-CM

## 2015-07-13 MED ORDER — ATENOLOL 50 MG PO TABS: 50.0000 mg | ORAL_TABLET | Freq: Two times a day (BID) | ORAL | Status: DC

## 2015-07-13 MED ORDER — POTASSIUM CHLORIDE ER 20 MEQ PO TBCR: 20.0000 meq | EXTENDED_RELEASE_TABLET | Freq: Every day | ORAL | Status: DC

## 2015-07-13 MED ORDER — FUROSEMIDE 40 MG PO TABS: 40.0000 mg | ORAL_TABLET | Freq: Every day | ORAL | Status: DC

## 2015-07-13 NOTE — Patient Instructions (Signed)
Medication Instructions:  Your physician has recommended you make the following change in your medication: 1) INCREASE LASIX to 40 mg daily 2) INCREASE KDUR to 20 meq daily  Labwork: IN ONE WEEK: BMET and CBC  Testing/Procedures: None  Follow-Up: Your physician wants you to follow-up in: 6 months with Dr. Radford Pax. You will receive a reminder letter in the mail two months in advance. If you don't receive a letter, please call our office to schedule the follow-up appointment.   Any Other Special Instructions Will Be Listed Below (If Applicable). Check your BLOOD PRESSURE daily for a week and bring your readings when you get your lab work.

## 2015-07-22 ENCOUNTER — Telehealth: Payer: Self-pay | Admitting: Cardiology

## 2015-07-22 ENCOUNTER — Other Ambulatory Visit (INDEPENDENT_AMBULATORY_CARE_PROVIDER_SITE_OTHER): Payer: Medicare Other | Admitting: *Deleted

## 2015-07-22 ENCOUNTER — Telehealth: Payer: Self-pay

## 2015-07-22 DIAGNOSIS — I480 Paroxysmal atrial fibrillation: Secondary | ICD-10-CM | POA: Diagnosis not present

## 2015-07-22 LAB — BASIC METABOLIC PANEL
BUN: 22 mg/dL (ref 6–23)
CO2: 31 mEq/L (ref 19–32)
Calcium: 9.2 mg/dL (ref 8.4–10.5)
Chloride: 103 mEq/L (ref 96–112)
Creatinine, Ser: 1.13 mg/dL (ref 0.40–1.20)
GFR: 49.2 mL/min — ABNORMAL LOW (ref >60.00)
Glucose, Bld: 154 mg/dL — ABNORMAL HIGH (ref 70–99)
Potassium: 4 mEq/L (ref 3.5–5.1)
Sodium: 140 mEq/L (ref 135–145)

## 2015-07-22 LAB — CBC WITH DIFFERENTIAL/PLATELET
Basophils Absolute: 0 10*3/uL (ref 0.0–0.1)
Basophils Relative: 0.3 % (ref 0.0–3.0)
Eosinophils Absolute: 0 10*3/uL (ref 0.0–0.7)
Eosinophils Relative: 0.8 % (ref 0.0–5.0)
HCT: 40.5 % (ref 36.0–46.0)
Hemoglobin: 13.4 g/dL (ref 12.0–15.0)
Lymphocytes Relative: 19.7 % (ref 12.0–46.0)
Lymphs Abs: 1.2 10*3/uL (ref 0.7–4.0)
MCHC: 33.2 g/dL (ref 30.0–36.0)
MCV: 95.4 fl (ref 78.0–100.0)
Monocytes Absolute: 0.4 10*3/uL (ref 0.1–1.0)
Monocytes Relative: 6.8 % (ref 3.0–12.0)
Neutro Abs: 4.4 10*3/uL (ref 1.4–7.7)
Neutrophils Relative %: 72.4 % (ref 43.0–77.0)
Platelets: 255 10*3/uL (ref 150.0–400.0)
RBC: 4.24 Mil/uL (ref 3.87–5.11)
RDW: 13.5 % (ref 11.5–15.5)
WBC: 6.1 10*3/uL (ref 4.0–10.5)

## 2015-07-22 NOTE — Telephone Encounter (Signed)
BP controlled no change in meds

## 2015-07-22 NOTE — Telephone Encounter (Signed)
Walk in pt form-pt dropped off BP readings-gave to [place]/KM

## 2015-07-22 NOTE — Telephone Encounter (Signed)
Patient dropped off blood pressure and HR readings:  8/2: BP 148/66 HR 60 8/3: BP 133/73 HR 55 8/4: BP 134/56 HR 61 8/5: BP 126/72 HR 61 8/6: BP 121/71 HR 56 8/7: BP 130/72 HR 58 8/8: BP 121/63 HR 61 8/9: BP 124/71 HR 63  To Dr. Radford Pax for review.

## 2015-07-23 NOTE — Telephone Encounter (Signed)
Informed patient of results and verbal understanding expressed.  

## 2015-07-23 NOTE — Telephone Encounter (Signed)
-----   Message from Sueanne Margarita, MD sent at 07/22/2015  7:08 PM EDT ----- Please let patient know that labs were normal.  Continue current medical therapy.  Forward to PCP

## 2015-07-23 NOTE — Telephone Encounter (Signed)
Patient's labs and BP normal.  Left message to call back.

## 2015-08-10 ENCOUNTER — Other Ambulatory Visit: Payer: Self-pay | Admitting: Cardiology

## 2015-09-09 DIAGNOSIS — L720 Epidermal cyst: Secondary | ICD-10-CM | POA: Diagnosis not present

## 2015-09-09 DIAGNOSIS — D1722 Benign lipomatous neoplasm of skin and subcutaneous tissue of left arm: Secondary | ICD-10-CM | POA: Diagnosis not present

## 2015-09-09 DIAGNOSIS — L603 Nail dystrophy: Secondary | ICD-10-CM | POA: Diagnosis not present

## 2015-09-09 DIAGNOSIS — D216 Benign neoplasm of connective and other soft tissue of trunk, unspecified: Secondary | ICD-10-CM | POA: Diagnosis not present

## 2015-09-09 DIAGNOSIS — D2262 Melanocytic nevi of left upper limb, including shoulder: Secondary | ICD-10-CM | POA: Diagnosis not present

## 2015-09-09 DIAGNOSIS — D3612 Benign neoplasm of peripheral nerves and autonomic nervous system, upper limb, including shoulder: Secondary | ICD-10-CM | POA: Diagnosis not present

## 2015-09-09 DIAGNOSIS — L821 Other seborrheic keratosis: Secondary | ICD-10-CM | POA: Diagnosis not present

## 2015-11-02 ENCOUNTER — Other Ambulatory Visit: Payer: Self-pay | Admitting: Cardiology

## 2015-11-11 ENCOUNTER — Other Ambulatory Visit: Payer: Self-pay

## 2015-11-11 DIAGNOSIS — Z1231 Encounter for screening mammogram for malignant neoplasm of breast: Secondary | ICD-10-CM

## 2015-11-12 DIAGNOSIS — I4891 Unspecified atrial fibrillation: Secondary | ICD-10-CM | POA: Diagnosis not present

## 2015-11-12 DIAGNOSIS — E782 Mixed hyperlipidemia: Secondary | ICD-10-CM | POA: Diagnosis not present

## 2015-11-12 DIAGNOSIS — C55 Malignant neoplasm of uterus, part unspecified: Secondary | ICD-10-CM | POA: Diagnosis not present

## 2015-11-12 DIAGNOSIS — Z1389 Encounter for screening for other disorder: Secondary | ICD-10-CM | POA: Diagnosis not present

## 2015-11-12 DIAGNOSIS — I10 Essential (primary) hypertension: Secondary | ICD-10-CM | POA: Diagnosis not present

## 2015-11-12 DIAGNOSIS — Z0000 Encounter for general adult medical examination without abnormal findings: Secondary | ICD-10-CM | POA: Diagnosis not present

## 2015-11-12 DIAGNOSIS — Z23 Encounter for immunization: Secondary | ICD-10-CM | POA: Diagnosis not present

## 2015-11-20 ENCOUNTER — Encounter: Payer: Self-pay | Admitting: Cardiology

## 2015-12-15 ENCOUNTER — Ambulatory Visit
Admission: RE | Admit: 2015-12-15 | Discharge: 2015-12-15 | Disposition: A | Discharge status: Home / Self Care | Payer: Medicare Other | Source: Ambulatory Visit

## 2015-12-15 DIAGNOSIS — Z1231 Encounter for screening mammogram for malignant neoplasm of breast: Secondary | ICD-10-CM

## 2015-12-16 DIAGNOSIS — H2513 Age-related nuclear cataract, bilateral: Secondary | ICD-10-CM | POA: Diagnosis not present

## 2016-02-08 ENCOUNTER — Other Ambulatory Visit: Payer: Self-pay | Admitting: Cardiology

## 2016-02-11 DIAGNOSIS — Z08 Encounter for follow-up examination after completed treatment for malignant neoplasm: Secondary | ICD-10-CM | POA: Diagnosis not present

## 2016-02-11 DIAGNOSIS — Z90710 Acquired absence of both cervix and uterus: Secondary | ICD-10-CM | POA: Diagnosis not present

## 2016-02-11 DIAGNOSIS — Z8542 Personal history of malignant neoplasm of other parts of uterus: Secondary | ICD-10-CM | POA: Diagnosis not present

## 2016-02-11 DIAGNOSIS — Z90722 Acquired absence of ovaries, bilateral: Secondary | ICD-10-CM | POA: Diagnosis not present

## 2016-02-11 DIAGNOSIS — C541 Malignant neoplasm of endometrium: Secondary | ICD-10-CM | POA: Diagnosis not present

## 2016-02-11 DIAGNOSIS — Z9079 Acquired absence of other genital organ(s): Secondary | ICD-10-CM | POA: Diagnosis not present

## 2016-03-08 NOTE — Progress Notes (Signed)
Cardiology Office Note   Date:  03/09/2016   ID:  Christina, Morgan 09/15/1933, MRN [id number]  PCP:  Christina Hams, MD    Chief Complaint  Patient presents with  . Atrial Fibrillation  . Hypertension  . Edema      History of Present Illness: Christina Morgan is a 82 y.o. female with a hx of HTN, HL and PAF on systemic anticoagulation. She presents for followup of her PAF today.She has not had any further breakthrough of PAF. She is doing well.The patient denies chest pain, shortness of breath, palpitations, syncope, orthopnea or PND. She has chronic RLE edema which is stable.    Past Medical History  Diagnosis Date  . Cancer [facility])     endometrial ca  . Dyslipidemia   . Hypertension   . Osteopenia   . Lipoma of skin     multiple  . Hyperlipidemia   . Fracture of left humerus   . Atrial fibrillation ([place])     failed DCCV 05/2014 >> Flecainide started >> DCCV 7/15 sucessful;  f/u ETT neg for pro-arrhythmia >> recurrent AF/AFL >> Flecainide inc to 100 bid with repeat DCCV 9/15  . History of cardiovascular stress test     Lexiscan Myoview (06/2014): No ischemia or scar, not gated, low risk  . Hx of echocardiogram     Echo (05/02/14): EF 60% to 65%. No regional wall motion abnormalities. Mild AI, mildly dilated aortic root (37 mm), trivial MR, trivial TR    Past Surgical History  Procedure Laterality Date  . Colonoscopy  2008  . Robotic assisted supracervical hysterectomy with bilateral salpingo oopherectomy  11/06/2012    and bilateral pelvic lymph node dissection  . Cardioversion N/A 06/05/2014    Procedure: CARDIOVERSION;  Surgeon: Sueanne Margarita, MD;  Location: [place];  Service: Cardiovascular;  Laterality: N/A;  . Cardioversion N/A 07/11/2014    Procedure: CARDIOVERSION;  Surgeon: Sueanne Margarita, MD;  Location: [place];  Service: Cardiovascular;  Laterality: N/A;  . Cardioversion N/A 09/04/2014    Procedure: CARDIOVERSION;   Surgeon: Sueanne Margarita, MD;  Location: [facility] ENDOSCOPY;  Service: Cardiovascular;  Laterality: N/A;  . Colonoscopy N/A 05/20/2015    Procedure: COLONOSCOPY;  Surgeon: Ronald Lobo, MD;  Location: [facility] ENDOSCOPY;  Service: Endoscopy;  Laterality: N/A;     Current Outpatient Prescriptions  Medication Sig Dispense Refill  . amLODipine (NORVASC) 5 MG tablet TAKE 1 TABLET BY MOUTH DAILY 30 tablet 8  . atenolol (TENORMIN) 50 MG tablet TAKE 1 TABLET BY MOUTH TWICE DAILY 60 tablet 1  . atorvastatin (LIPITOR) 10 MG tablet Take 10 mg by mouth daily at 6 PM.     . flecainide (TAMBOCOR) 100 MG tablet TAKE 1 TABLET BY MOUTH TWICE DAILY 60 tablet 1  . furosemide (LASIX) 20 MG tablet Take 20 mg by mouth daily.    . potassium chloride 20 MEQ TBCR Take 20 mEq by mouth daily. 30 tablet 6  . apixaban (ELIQUIS) 5 MG TABS tablet Take 5 mg by mouth 2 (two) times daily.     No current facility-administered medications for this visit.    Allergies:   Penicillins    Social History:  The patient  reports that she has never smoked. She has never used smokeless tobacco. She reports that she does not drink alcohol or use illicit drugs.   Family History:  The patient's family  history includes Cancer (age of onset: 76) in her father; Coronary artery disease in her mother; Heart attack in her mother.    ROS:  Please see the history of present illness.   Otherwise, review of systems are positive for none.   All other systems are reviewed and negative.    PHYSICAL EXAM: VS:  BP 144/70 mmHg  Pulse 68  Ht 5\' 6"  (1.676 m)  Wt 177 lb (80.287 kg)  BMI 28.58 kg/m2 , BMI Body mass index is 28.58 kg/(m^2). GEN: Well nourished, well developed, in no acute distress HEENT: normal Neck: no JVD, carotid bruits, or masses Cardiac: RRR; no murmurs, rubs, or gallops,no edema  Respiratory:  clear to auscultation bilaterally, normal work of breathing GI: soft, nontender, nondistended, + BS MS: no deformity or atrophy Skin: warm  and dry, no rash Neuro:  Strength and sensation are intact Psych: euthymic mood, full affect   EKG:  EKG was ordered today NSR at 66bpm with first degree AV block with ILBBB    Recent Labs: 07/22/2015: BUN 22; Creatinine, Ser 1.13; Hemoglobin 13.4; Platelets 255.0; Potassium 4.0; Sodium 140    Lipid Panel No results found for: CHOL, TRIG, HDL, CHOLHDL, VLDL, LDLCALC, LDLDIRECT    Wt Readings from Last 3 Encounters:  03/09/16 177 lb (80.287 kg)  07/13/15 176 lb 12.8 oz (80.196 kg)  11/18/14 168 lb (76.204 kg)    ASSESSMENT AND PLAN:  1. Paroxysmal atrial fibrillation/flutter: Maintaining NSR. CHADS2-VASc=4. She requires long term anticoagulation. She is tolerating Eliquis. Continue Eliquis/Tenormin and flecainide. Check NOAC .  EKG shows first degree AV block with nonspecific IVCD with QRS 34msec but unchanged from EKG 2015  2. Essential hypertension, benign: BP is controlled today.  Continue current Rx.  3. Mixed hyperlipidemia: Continue statin.  Check FLP and ALT  4. LE Edema: Edema appears stabl. Her right leg is more swollen than the left due to prior lymph node resection in her groin. Continue Lasix.     Current medicines are reviewed at length with the patient today.  The patient does not have concerns regarding medicines.  The following changes have been made:  no change  Labs/ tests ordered today: See above Assessment and Plan No orders of the defined types were placed in this encounter.     Disposition:   FU with me in 6 months  Signed, Sueanne Margarita, MD  03/09/2016 2:50 PM    [facility] [place], [place], [place]  [address] Phone: [phone]; Fax: [phone]

## 2016-03-09 ENCOUNTER — Encounter: Payer: Self-pay | Admitting: Cardiology

## 2016-03-09 ENCOUNTER — Ambulatory Visit (INDEPENDENT_AMBULATORY_CARE_PROVIDER_SITE_OTHER): Payer: Medicare Other | Admitting: Cardiology

## 2016-03-09 VITALS — BP 144/70 | HR 68 | Ht 66.0 in | Wt 177.0 lb

## 2016-03-09 DIAGNOSIS — E782 Mixed hyperlipidemia: Secondary | ICD-10-CM | POA: Diagnosis not present

## 2016-03-09 DIAGNOSIS — I480 Paroxysmal atrial fibrillation: Secondary | ICD-10-CM | POA: Diagnosis not present

## 2016-03-09 DIAGNOSIS — R609 Edema, unspecified: Secondary | ICD-10-CM | POA: Diagnosis not present

## 2016-03-09 DIAGNOSIS — I10 Essential (primary) hypertension: Secondary | ICD-10-CM | POA: Diagnosis not present

## 2016-03-09 DIAGNOSIS — R600 Localized edema: Secondary | ICD-10-CM

## 2016-03-09 NOTE — Patient Instructions (Signed)

## 2016-03-21 ENCOUNTER — Other Ambulatory Visit (INDEPENDENT_AMBULATORY_CARE_PROVIDER_SITE_OTHER): Payer: Medicare Other | Admitting: *Deleted

## 2016-03-21 DIAGNOSIS — I4891 Unspecified atrial fibrillation: Secondary | ICD-10-CM | POA: Diagnosis not present

## 2016-03-21 DIAGNOSIS — E782 Mixed hyperlipidemia: Secondary | ICD-10-CM | POA: Diagnosis not present

## 2016-03-21 DIAGNOSIS — I480 Paroxysmal atrial fibrillation: Secondary | ICD-10-CM

## 2016-03-21 LAB — BASIC METABOLIC PANEL
BUN: 22 mg/dL (ref 7–25)
CO2: 30 mmol/L (ref 20–31)
Calcium: 9.2 mg/dL (ref 8.6–10.4)
Chloride: 106 mmol/L (ref 98–110)
Creat: 0.82 mg/dL (ref 0.60–0.88)
Glucose, Bld: 110 mg/dL — ABNORMAL HIGH (ref 65–99)
Potassium: 4.3 mmol/L (ref 3.5–5.3)
Sodium: 144 mmol/L (ref 135–146)

## 2016-03-21 LAB — CBC WITH DIFFERENTIAL/PLATELET
Basophils Absolute: 0 cells/uL (ref 0–200)
Basophils Relative: 0 %
Eosinophils Absolute: 54 cells/uL (ref 15–500)
Eosinophils Relative: 1 %
HCT: 40 % (ref 35.0–45.0)
Hemoglobin: 13.2 g/dL (ref 11.7–15.5)
Lymphocytes Relative: 23 %
Lymphs Abs: 1242 cells/uL (ref 850–3900)
MCH: 31.7 pg (ref 27.0–33.0)
MCHC: 33 g/dL (ref 32.0–36.0)
MCV: 95.9 fL (ref 80.0–100.0)
MPV: 10.8 fL (ref 7.5–12.5)
Monocytes Absolute: 540 cells/uL (ref 200–950)
Monocytes Relative: 10 %
Neutro Abs: 3564 cells/uL (ref 1500–7800)
Neutrophils Relative %: 66 %
Platelets: 250 10*3/uL (ref 140–400)
RBC: 4.17 MIL/uL (ref 3.80–5.10)
RDW: 13.9 % (ref 11.0–15.0)
WBC: 5.4 10*3/uL (ref 3.8–10.8)

## 2016-03-21 LAB — HEPATIC FUNCTION PANEL
ALT: 13 U/L (ref 6–29)
AST: 15 U/L (ref 10–35)
Albumin: 3.7 g/dL (ref 3.6–5.1)
Alkaline Phosphatase: 71 U/L (ref 33–130)
Bilirubin, Direct: 0.1 mg/dL (ref <=0.2)
Indirect Bilirubin: 0.5 mg/dL (ref 0.2–1.2)
Total Bilirubin: 0.6 mg/dL (ref 0.2–1.2)
Total Protein: 6.1 g/dL (ref 6.1–8.1)

## 2016-03-21 LAB — LIPID PANEL
Cholesterol: 175 mg/dL (ref 125–200)
HDL: 63 mg/dL (ref >=46)
LDL Cholesterol: 81 mg/dL (ref <130)
Total CHOL/HDL Ratio: 2.8 Ratio (ref <=5.0)
Triglycerides: 155 mg/dL — ABNORMAL HIGH (ref <150)
VLDL: 31 mg/dL — ABNORMAL HIGH (ref <30)

## 2016-04-04 ENCOUNTER — Other Ambulatory Visit: Payer: Self-pay | Admitting: Cardiology

## 2016-04-06 ENCOUNTER — Other Ambulatory Visit: Payer: Self-pay | Admitting: Cardiology

## 2016-04-25 ENCOUNTER — Other Ambulatory Visit: Payer: Self-pay | Admitting: Cardiology

## 2016-04-25 NOTE — Telephone Encounter (Signed)
REFILL 

## 2016-05-13 DIAGNOSIS — I10 Essential (primary) hypertension: Secondary | ICD-10-CM | POA: Diagnosis not present

## 2016-05-13 DIAGNOSIS — E782 Mixed hyperlipidemia: Secondary | ICD-10-CM | POA: Diagnosis not present

## 2016-05-13 DIAGNOSIS — I4891 Unspecified atrial fibrillation: Secondary | ICD-10-CM | POA: Diagnosis not present

## 2016-05-13 DIAGNOSIS — C55 Malignant neoplasm of uterus, part unspecified: Secondary | ICD-10-CM | POA: Diagnosis not present

## 2016-07-25 ENCOUNTER — Other Ambulatory Visit: Payer: Self-pay | Admitting: Cardiology

## 2016-08-19 ENCOUNTER — Encounter: Payer: Self-pay | Admitting: Physician Assistant

## 2016-09-01 DIAGNOSIS — Z8542 Personal history of malignant neoplasm of other parts of uterus: Secondary | ICD-10-CM | POA: Diagnosis not present

## 2016-09-01 DIAGNOSIS — Z9079 Acquired absence of other genital organ(s): Secondary | ICD-10-CM | POA: Diagnosis not present

## 2016-09-01 DIAGNOSIS — C7982 Secondary malignant neoplasm of genital organs: Secondary | ICD-10-CM | POA: Diagnosis not present

## 2016-09-01 DIAGNOSIS — Z90710 Acquired absence of both cervix and uterus: Secondary | ICD-10-CM | POA: Diagnosis not present

## 2016-09-01 DIAGNOSIS — Z90722 Acquired absence of ovaries, bilateral: Secondary | ICD-10-CM | POA: Diagnosis not present

## 2016-09-01 DIAGNOSIS — Z08 Encounter for follow-up examination after completed treatment for malignant neoplasm: Secondary | ICD-10-CM | POA: Diagnosis not present

## 2016-09-01 DIAGNOSIS — C541 Malignant neoplasm of endometrium: Secondary | ICD-10-CM | POA: Diagnosis not present

## 2016-09-01 DIAGNOSIS — N898 Other specified noninflammatory disorders of vagina: Secondary | ICD-10-CM | POA: Diagnosis not present

## 2016-09-05 ENCOUNTER — Encounter (INDEPENDENT_AMBULATORY_CARE_PROVIDER_SITE_OTHER): Payer: Self-pay

## 2016-09-05 ENCOUNTER — Encounter: Payer: Self-pay | Admitting: Physician Assistant

## 2016-09-05 ENCOUNTER — Ambulatory Visit (INDEPENDENT_AMBULATORY_CARE_PROVIDER_SITE_OTHER): Payer: Medicare Other | Admitting: Physician Assistant

## 2016-09-05 VITALS — BP 148/80 | HR 60 | Ht 66.0 in | Wt 180.8 lb

## 2016-09-05 DIAGNOSIS — H9190 Unspecified hearing loss, unspecified ear: Secondary | ICD-10-CM | POA: Diagnosis not present

## 2016-09-05 DIAGNOSIS — I10 Essential (primary) hypertension: Secondary | ICD-10-CM

## 2016-09-05 DIAGNOSIS — R609 Edema, unspecified: Secondary | ICD-10-CM

## 2016-09-05 DIAGNOSIS — E782 Mixed hyperlipidemia: Secondary | ICD-10-CM | POA: Diagnosis not present

## 2016-09-05 DIAGNOSIS — I480 Paroxysmal atrial fibrillation: Secondary | ICD-10-CM

## 2016-09-05 DIAGNOSIS — H6120 Impacted cerumen, unspecified ear: Secondary | ICD-10-CM | POA: Diagnosis not present

## 2016-09-05 DIAGNOSIS — R600 Localized edema: Secondary | ICD-10-CM

## 2016-09-05 NOTE — Progress Notes (Signed)
Cardiology Office Note    Date:  09/05/2016   ID:  Magdalena, Skilton September 14, 1933, MRN [id number]  PCP:  Kandice Hams, MD  Cardiologist: Dr. Radford Pax  No chief complaint on file.   History of Present Illness:  Christina Morgan is a 82 y.o. female with a hx of HTN, HL and PAF on Flecainide and systemic anticoagulation and chronic lower extremity edema on Lasix. Lexi scan Myoview in 2015 no ischemia or scar.  Patient comes in today accompanied by her daughter. She denies any chest pain, palpitations, dyspnea, dizziness or presyncope. She has chronic dyspnea on exertion with little activity that is unchanged since her last office visit. Her edema is managed well with the Lasix. No bleeding problems on Eliquis. Her main complaint today is difficulty hearing in her right ear. She was told she had wax in it but the treatment did not help.     Past Medical History:  Diagnosis Date  . Atrial fibrillation ([place])    failed DCCV 05/2014 >> Flecainide started >> DCCV 7/15 sucessful;  f/u ETT neg for pro-arrhythmia >> recurrent AF/AFL >> Flecainide inc to 100 bid with repeat DCCV 9/15  . Cancer [facility])    endometrial ca  . Dyslipidemia   . Fracture of left humerus   . History of cardiovascular stress test    Lexiscan Myoview (06/2014): No ischemia or scar, not gated, low risk  . Hx of echocardiogram    Echo (05/02/14): EF 60% to 65%. No regional wall motion abnormalities. Mild AI, mildly dilated aortic root (37 mm), trivial MR, trivial TR  . Hyperlipidemia   . Hypertension   . Lipoma of skin    multiple  . Osteopenia     Past Surgical History:  Procedure Laterality Date  . CARDIOVERSION N/A 06/05/2014   Procedure: CARDIOVERSION;  Surgeon: Sueanne Margarita, MD;  Location: [place];  Service: Cardiovascular;  Laterality: N/A;  . CARDIOVERSION N/A 07/11/2014   Procedure: CARDIOVERSION;  Surgeon: Sueanne Margarita, MD;  Location: [facility] ENDOSCOPY;  Service: Cardiovascular;  Laterality: N/A;    . CARDIOVERSION N/A 09/04/2014   Procedure: CARDIOVERSION;  Surgeon: Sueanne Margarita, MD;  Location: [facility] ENDOSCOPY;  Service: Cardiovascular;  Laterality: N/A;  . COLONOSCOPY  2008  . COLONOSCOPY N/A 05/20/2015   Procedure: COLONOSCOPY;  Surgeon: Ronald Lobo, MD;  Location: [facility] ENDOSCOPY;  Service: Endoscopy;  Laterality: N/A;  . ROBOTIC ASSISTED SUPRACERVICAL HYSTERECTOMY WITH BILATERAL SALPINGO OOPHERECTOMY  11/06/2012   and bilateral pelvic lymph node dissection    Current Medications: Outpatient Medications Prior to Visit  Medication Sig Dispense Refill  . amLODipine (NORVASC) 5 MG tablet TAKE 1 TABLET BY MOUTH ONCE DAILY 30 tablet 6  . apixaban (ELIQUIS) 5 MG TABS tablet Take 5 mg by mouth 2 (two) times daily.    Marland Kitchen atenolol (TENORMIN) 50 MG tablet TAKE 1 TABLET BY MOUTH TWICE DAILY 60 tablet 11  . atorvastatin (LIPITOR) 10 MG tablet Take 10 mg by mouth daily at 6 PM.     . flecainide (TAMBOCOR) 100 MG tablet TAKE 1 TABLET BY MOUTH TWICE DAILY 60 tablet 10  . furosemide (LASIX) 40 MG tablet TAKE 1 TABLET BY MOUTH ONCE DAILY 30 tablet 5  . potassium chloride SA (K-DUR,KLOR-CON) 20 MEQ tablet TAKE 1 TABLET BY MOUTH DAILY 30 tablet 5   No facility-administered medications prior to visit.      Allergies:   Penicillins   Social History   Social History  . Marital status: Widowed  Spouse name: N/A  . Number of children: N/A  . Years of education: N/A   Social History Main Topics  . Smoking status: Never Smoker  . Smokeless tobacco: Never Used  . Alcohol use No  . Drug use: No  . Sexual activity: Not Asked   Other Topics Concern  . None   Social History Narrative  . None     Family History:  The patient's   family history includes Cancer (age of onset: 4) in her father; Coronary artery disease in her mother; Heart attack in her mother.   ROS:   Please see the history of present illness.    Review of Systems  Constitution: Positive for weakness and malaise/fatigue.   HENT: Negative.   Eyes: Negative.   Cardiovascular: Positive for dyspnea on exertion and leg swelling.  Respiratory: Negative.   Hematologic/Lymphatic: Negative.   Musculoskeletal: Negative.  Negative for joint pain.  Gastrointestinal: Negative.   Genitourinary: Negative.    All other systems reviewed and are negative.   PHYSICAL EXAM:   VS:  BP (!) 148/80   Pulse 60   Ht 5\' 6"  (1.676 m)   Wt 180 lb 12.8 oz (82 kg)   BMI 29.18 kg/m   Physical Exam  GEN: Well nourished, well developed, in no acute distress  Neck: no JVD, carotid bruits, or masses Cardiac:RRR; no murmurs, rubs, or gallops  Respiratory:  clear to auscultation bilaterally, normal work of breathing GI: soft, nontender, nondistended, + BS Ext: without cyanosis, clubbing, or edema, Good distal pulses bilaterally MS: no deformity or atrophy  Skin: warm and dry, no rash Psych: euthymic mood, full affect  Wt Readings from Last 3 Encounters:  09/05/16 180 lb 12.8 oz (82 kg)  03/09/16 177 lb (80.3 kg)  07/13/15 176 lb 12.8 oz (80.2 kg)      Studies/Labs Reviewed:   EKG:  EKG is  ordered today.  The ekg ordered today demonstrates Sinus bradycardia with first-degree AV block heart rate 55 bpm  Recent Labs: 03/21/2016: ALT 13; BUN 22; Creat 0.82; Hemoglobin 13.2; Platelets 250; Potassium 4.3; Sodium 144   Lipid Panel    Component Value Date/Time   CHOL 175 03/21/2016 0915   TRIG 155 (H) 03/21/2016 0915   HDL 63 03/21/2016 0915   CHOLHDL 2.8 03/21/2016 0915   VLDL 31 (H) 03/21/2016 0915   LDLCALC 81 03/21/2016 0915    Additional studies/ records that were reviewed today include:  2-D echo 2015 Study Conclusions  - Left ventricle: The cavity size was normal. Systolic function was   normal. The estimated ejection fraction was in the range of 60%   to 65%. Wall motion was normal; there were no regional wall   motion abnormalities. - Aortic valve: Trileaflet; mildly thickened leaflets. There was   mild  regurgitation. - Aorta: Aortic root dimension: 37 mm (ED). - Ascending aorta: The ascending aorta was mildly dilated. - Mitral valve: There was trivial regurgitation. - Tricuspid valve: There was trivial regurgitation.     ASSESSMENT:    1. Paroxysmal atrial fibrillation (HCC)   2. Essential hypertension, benign   3. Mixed hyperlipidemia   4. Edema extremities      PLAN:  In order of problems listed above:  PAF maintaining normal sinus rhythm on flecainide and Eliquis. Heart rate is a little slower than it has been in the past and she is complaining of fatigue. They will continue to monitor at home and call if her heart rate drops below  50 bpm. Could decrease her atenolol if needed. Follow-up with Dr. Radford Pax in 6 months. Labs checked in April were stable.  Essential hypertension blood pressure up a little bit today but has been stable. Hyperlipidemia on Lipitor  Lower extremity edema controlled on low-dose Lasix.      Medication Adjustments/Labs and Tests Ordered: Current medicines are reviewed at length with the patient today.  Concerns regarding medicines are outlined above.  Medication changes, Labs and Tests ordered today are listed in the Patient Instructions below. Patient Instructions  Medication Instructions:  Your physician recommends that you continue on your current medications as directed. Please refer to the Current Medication list given to you today.   Labwork: None ordered  Testing/Procedures: None ordered  Follow-Up: Your physician wants you to follow-up in: 6 months with Dr.Turner You will receive a reminder letter in the mail two months in advance. If you don't receive a letter, please call our office to schedule the follow-up appointment.   Any Other Special Instructions Will Be Listed Below (If Applicable).     If you need a refill on your cardiac medications before your next appointment, please call your pharmacy.      Sumner Boast, PA-C  09/05/2016 10:17 AM    [facility] [address], [place], [place]  [address] Phone: [phone]; Fax: [phone]

## 2016-09-05 NOTE — Patient Instructions (Signed)

## 2016-09-09 DIAGNOSIS — Z90710 Acquired absence of both cervix and uterus: Secondary | ICD-10-CM | POA: Diagnosis not present

## 2016-09-09 DIAGNOSIS — C541 Malignant neoplasm of endometrium: Secondary | ICD-10-CM | POA: Diagnosis not present

## 2016-09-15 DIAGNOSIS — Z90710 Acquired absence of both cervix and uterus: Secondary | ICD-10-CM | POA: Diagnosis not present

## 2016-09-15 DIAGNOSIS — Z9079 Acquired absence of other genital organ(s): Secondary | ICD-10-CM | POA: Diagnosis not present

## 2016-09-15 DIAGNOSIS — I4891 Unspecified atrial fibrillation: Secondary | ICD-10-CM | POA: Diagnosis not present

## 2016-09-15 DIAGNOSIS — E785 Hyperlipidemia, unspecified: Secondary | ICD-10-CM | POA: Diagnosis not present

## 2016-09-15 DIAGNOSIS — Z90722 Acquired absence of ovaries, bilateral: Secondary | ICD-10-CM | POA: Diagnosis not present

## 2016-09-15 DIAGNOSIS — C541 Malignant neoplasm of endometrium: Secondary | ICD-10-CM | POA: Diagnosis not present

## 2016-09-19 ENCOUNTER — Telehealth: Payer: Self-pay | Admitting: Physician Assistant

## 2016-09-19 ENCOUNTER — Telehealth: Payer: Self-pay | Admitting: *Deleted

## 2016-09-19 NOTE — Telephone Encounter (Signed)
Lauren from Dr Shelba Flake office called back, voiced thanks and states she will get written note from Care Everywhere. I advised her if that does not work, just call back. She voiced understanding and thanks.

## 2016-09-19 NOTE — Telephone Encounter (Signed)
I called Dr Shelba Flake office and left message on nurse's voicemail Ander Purpura) advising (per Dr Radford Pax) that patient is at low risk from cardiac stand point, may hold Eliquis 48 hours prior to procedure and restart when ok w/ surgery.  I also called patient and left message for return call to advise of the above.

## 2016-09-19 NOTE — Telephone Encounter (Signed)
Follow up ° ° °Pt verbalized that she is returning call for rn °

## 2016-09-19 NOTE — Telephone Encounter (Signed)
New Message  Request for surgical clearance:  1. What type of surgery is being performed? Robotic Assisted Operative Vaginectomy  2. When is this surgery scheduled? No as of yet  3. Are there any medications that need to be held prior to surgery and how long? Eliquis and up to [place] on what to do and appropriate   4. Name of physician performing surgery? [facility]   5. What is your office phone and fax number? [phone](fax)

## 2016-09-19 NOTE — Telephone Encounter (Signed)
09/19/16--copied from additional phone note dated 09/19/16:  called Dr Shelba Flake office and left message on nurse's voicemail Ander Purpura) advising (per Dr Radford Pax) that patient is at low risk from cardiac stand point, may hold Eliquis 48 hours prior to procedure and restart when ok w/ surgery.  I also called patient and left message for return call to advise of the above.  I spoke with pt and discussed Dr Theodosia Blender recommendations, pt verbalized understanding

## 2016-09-19 NOTE — Telephone Encounter (Signed)
Patient is low risk from a cardiac standpoint for surgery.  OK to hold Eliquis for surgery starting 48 hours prior to OR.  Restart when ok with surgery

## 2016-09-20 ENCOUNTER — Telehealth: Payer: Self-pay | Admitting: Cardiology

## 2016-09-20 DIAGNOSIS — L308 Other specified dermatitis: Secondary | ICD-10-CM | POA: Diagnosis not present

## 2016-09-20 DIAGNOSIS — D2262 Melanocytic nevi of left upper limb, including shoulder: Secondary | ICD-10-CM | POA: Diagnosis not present

## 2016-09-20 DIAGNOSIS — D1722 Benign lipomatous neoplasm of skin and subcutaneous tissue of left arm: Secondary | ICD-10-CM | POA: Diagnosis not present

## 2016-09-20 DIAGNOSIS — D3617 Benign neoplasm of peripheral nerves and autonomic nervous system of trunk, unspecified: Secondary | ICD-10-CM | POA: Diagnosis not present

## 2016-09-20 DIAGNOSIS — Z23 Encounter for immunization: Secondary | ICD-10-CM | POA: Diagnosis not present

## 2016-09-20 DIAGNOSIS — L821 Other seborrheic keratosis: Secondary | ICD-10-CM | POA: Diagnosis not present

## 2016-09-20 NOTE — Telephone Encounter (Signed)
New Message  Christina Morgan voiced Christina Morgan told her she cannot see cardiac clearance in care everywhere and to resend it.  Please f/u

## 2016-09-20 NOTE — Telephone Encounter (Signed)
Sueanne Margarita, MD  to [place] Triage    09/19/16 2:02 PM  Note    Patient is low risk from a cardiac standpoint for surgery.  OK to hold Eliquis for surgery starting 48 hours prior to OR.  Restart when ok with surgery      Faxed to [phone]

## 2016-10-11 DIAGNOSIS — J302 Other seasonal allergic rhinitis: Secondary | ICD-10-CM | POA: Diagnosis not present

## 2016-10-11 DIAGNOSIS — R0982 Postnasal drip: Secondary | ICD-10-CM | POA: Diagnosis not present

## 2016-10-13 DIAGNOSIS — C541 Malignant neoplasm of endometrium: Secondary | ICD-10-CM | POA: Diagnosis not present

## 2016-10-13 DIAGNOSIS — I499 Cardiac arrhythmia, unspecified: Secondary | ICD-10-CM | POA: Diagnosis not present

## 2016-10-13 DIAGNOSIS — I517 Cardiomegaly: Secondary | ICD-10-CM | POA: Diagnosis not present

## 2016-10-13 DIAGNOSIS — Z01818 Encounter for other preprocedural examination: Secondary | ICD-10-CM | POA: Diagnosis not present

## 2016-10-13 DIAGNOSIS — I10 Essential (primary) hypertension: Secondary | ICD-10-CM | POA: Diagnosis not present

## 2016-10-13 DIAGNOSIS — Z79899 Other long term (current) drug therapy: Secondary | ICD-10-CM | POA: Diagnosis not present

## 2016-10-13 DIAGNOSIS — E785 Hyperlipidemia, unspecified: Secondary | ICD-10-CM | POA: Diagnosis not present

## 2016-10-20 DIAGNOSIS — Z880 Allergy status to penicillin: Secondary | ICD-10-CM | POA: Diagnosis not present

## 2016-10-20 DIAGNOSIS — I10 Essential (primary) hypertension: Secondary | ICD-10-CM | POA: Diagnosis not present

## 2016-10-20 DIAGNOSIS — M8580 Other specified disorders of bone density and structure, unspecified site: Secondary | ICD-10-CM | POA: Diagnosis not present

## 2016-10-20 DIAGNOSIS — C541 Malignant neoplasm of endometrium: Secondary | ICD-10-CM | POA: Diagnosis not present

## 2016-10-20 DIAGNOSIS — Z7901 Long term (current) use of anticoagulants: Secondary | ICD-10-CM | POA: Diagnosis not present

## 2016-10-20 DIAGNOSIS — I4891 Unspecified atrial fibrillation: Secondary | ICD-10-CM | POA: Diagnosis not present

## 2016-10-20 DIAGNOSIS — Z79899 Other long term (current) drug therapy: Secondary | ICD-10-CM | POA: Diagnosis not present

## 2016-10-20 DIAGNOSIS — E785 Hyperlipidemia, unspecified: Secondary | ICD-10-CM | POA: Diagnosis not present

## 2016-10-21 DIAGNOSIS — I10 Essential (primary) hypertension: Secondary | ICD-10-CM | POA: Diagnosis not present

## 2016-10-21 DIAGNOSIS — E785 Hyperlipidemia, unspecified: Secondary | ICD-10-CM | POA: Diagnosis not present

## 2016-10-21 DIAGNOSIS — Z79899 Other long term (current) drug therapy: Secondary | ICD-10-CM | POA: Diagnosis not present

## 2016-10-21 DIAGNOSIS — M8580 Other specified disorders of bone density and structure, unspecified site: Secondary | ICD-10-CM | POA: Diagnosis not present

## 2016-10-21 DIAGNOSIS — Z7901 Long term (current) use of anticoagulants: Secondary | ICD-10-CM | POA: Diagnosis not present

## 2016-10-21 DIAGNOSIS — C541 Malignant neoplasm of endometrium: Secondary | ICD-10-CM | POA: Diagnosis not present

## 2016-10-27 DIAGNOSIS — C541 Malignant neoplasm of endometrium: Secondary | ICD-10-CM | POA: Diagnosis not present

## 2016-11-10 DIAGNOSIS — C541 Malignant neoplasm of endometrium: Secondary | ICD-10-CM | POA: Diagnosis not present

## 2016-11-10 DIAGNOSIS — Z90722 Acquired absence of ovaries, bilateral: Secondary | ICD-10-CM | POA: Diagnosis not present

## 2016-11-10 DIAGNOSIS — Z923 Personal history of irradiation: Secondary | ICD-10-CM | POA: Diagnosis not present

## 2016-11-21 DIAGNOSIS — C541 Malignant neoplasm of endometrium: Secondary | ICD-10-CM | POA: Diagnosis not present

## 2016-11-22 DIAGNOSIS — C7982 Secondary malignant neoplasm of genital organs: Secondary | ICD-10-CM | POA: Diagnosis not present

## 2016-11-24 DIAGNOSIS — C55 Malignant neoplasm of uterus, part unspecified: Secondary | ICD-10-CM | POA: Diagnosis not present

## 2016-11-24 DIAGNOSIS — I10 Essential (primary) hypertension: Secondary | ICD-10-CM | POA: Diagnosis not present

## 2016-11-24 DIAGNOSIS — I4891 Unspecified atrial fibrillation: Secondary | ICD-10-CM | POA: Diagnosis not present

## 2016-11-24 DIAGNOSIS — Z23 Encounter for immunization: Secondary | ICD-10-CM | POA: Diagnosis not present

## 2016-11-24 DIAGNOSIS — Z0000 Encounter for general adult medical examination without abnormal findings: Secondary | ICD-10-CM | POA: Diagnosis not present

## 2016-11-24 DIAGNOSIS — Z1389 Encounter for screening for other disorder: Secondary | ICD-10-CM | POA: Diagnosis not present

## 2016-11-24 DIAGNOSIS — E7800 Pure hypercholesterolemia, unspecified: Secondary | ICD-10-CM | POA: Diagnosis not present

## 2016-12-06 ENCOUNTER — Other Ambulatory Visit: Payer: Self-pay | Admitting: Internal Medicine

## 2016-12-06 DIAGNOSIS — Z1231 Encounter for screening mammogram for malignant neoplasm of breast: Secondary | ICD-10-CM

## 2016-12-07 ENCOUNTER — Other Ambulatory Visit: Payer: Self-pay | Admitting: Cardiology

## 2016-12-14 DIAGNOSIS — I4891 Unspecified atrial fibrillation: Secondary | ICD-10-CM | POA: Diagnosis not present

## 2016-12-14 DIAGNOSIS — Z510 Encounter for antineoplastic radiation therapy: Secondary | ICD-10-CM | POA: Diagnosis not present

## 2016-12-14 DIAGNOSIS — C7982 Secondary malignant neoplasm of genital organs: Secondary | ICD-10-CM | POA: Diagnosis not present

## 2016-12-14 DIAGNOSIS — Z9079 Acquired absence of other genital organ(s): Secondary | ICD-10-CM | POA: Diagnosis not present

## 2016-12-14 DIAGNOSIS — Z8541 Personal history of malignant neoplasm of cervix uteri: Secondary | ICD-10-CM | POA: Diagnosis not present

## 2016-12-14 DIAGNOSIS — Z923 Personal history of irradiation: Secondary | ICD-10-CM | POA: Diagnosis not present

## 2016-12-14 DIAGNOSIS — C55 Malignant neoplasm of uterus, part unspecified: Secondary | ICD-10-CM | POA: Diagnosis not present

## 2016-12-14 DIAGNOSIS — E785 Hyperlipidemia, unspecified: Secondary | ICD-10-CM | POA: Diagnosis not present

## 2016-12-14 DIAGNOSIS — M8580 Other specified disorders of bone density and structure, unspecified site: Secondary | ICD-10-CM | POA: Diagnosis not present

## 2016-12-23 DIAGNOSIS — C541 Malignant neoplasm of endometrium: Secondary | ICD-10-CM | POA: Diagnosis not present

## 2016-12-23 DIAGNOSIS — C7982 Secondary malignant neoplasm of genital organs: Secondary | ICD-10-CM | POA: Diagnosis not present

## 2016-12-23 DIAGNOSIS — Z510 Encounter for antineoplastic radiation therapy: Secondary | ICD-10-CM | POA: Diagnosis not present

## 2016-12-27 ENCOUNTER — Ambulatory Visit
Admission: RE | Admit: 2016-12-27 | Discharge: 2016-12-27 | Disposition: A | Discharge status: Home / Self Care | Payer: Medicare Other | Source: Ambulatory Visit | Attending: Internal Medicine | Admitting: Internal Medicine

## 2016-12-27 DIAGNOSIS — Z1231 Encounter for screening mammogram for malignant neoplasm of breast: Secondary | ICD-10-CM

## 2017-01-02 DIAGNOSIS — C7982 Secondary malignant neoplasm of genital organs: Secondary | ICD-10-CM | POA: Diagnosis not present

## 2017-01-02 DIAGNOSIS — Z510 Encounter for antineoplastic radiation therapy: Secondary | ICD-10-CM | POA: Diagnosis not present

## 2017-01-02 DIAGNOSIS — C541 Malignant neoplasm of endometrium: Secondary | ICD-10-CM | POA: Diagnosis not present

## 2017-01-03 DIAGNOSIS — C541 Malignant neoplasm of endometrium: Secondary | ICD-10-CM | POA: Diagnosis not present

## 2017-01-03 DIAGNOSIS — C7982 Secondary malignant neoplasm of genital organs: Secondary | ICD-10-CM | POA: Diagnosis not present

## 2017-01-03 DIAGNOSIS — Z510 Encounter for antineoplastic radiation therapy: Secondary | ICD-10-CM | POA: Diagnosis not present

## 2017-01-04 DIAGNOSIS — Z510 Encounter for antineoplastic radiation therapy: Secondary | ICD-10-CM | POA: Diagnosis not present

## 2017-01-04 DIAGNOSIS — C7982 Secondary malignant neoplasm of genital organs: Secondary | ICD-10-CM | POA: Diagnosis not present

## 2017-01-04 DIAGNOSIS — C541 Malignant neoplasm of endometrium: Secondary | ICD-10-CM | POA: Diagnosis not present

## 2017-01-05 DIAGNOSIS — C7982 Secondary malignant neoplasm of genital organs: Secondary | ICD-10-CM | POA: Diagnosis not present

## 2017-01-05 DIAGNOSIS — Z510 Encounter for antineoplastic radiation therapy: Secondary | ICD-10-CM | POA: Diagnosis not present

## 2017-01-05 DIAGNOSIS — C541 Malignant neoplasm of endometrium: Secondary | ICD-10-CM | POA: Diagnosis not present

## 2017-01-06 DIAGNOSIS — C541 Malignant neoplasm of endometrium: Secondary | ICD-10-CM | POA: Diagnosis not present

## 2017-01-06 DIAGNOSIS — Z510 Encounter for antineoplastic radiation therapy: Secondary | ICD-10-CM | POA: Diagnosis not present

## 2017-01-06 DIAGNOSIS — C7982 Secondary malignant neoplasm of genital organs: Secondary | ICD-10-CM | POA: Diagnosis not present

## 2017-01-09 DIAGNOSIS — C7982 Secondary malignant neoplasm of genital organs: Secondary | ICD-10-CM | POA: Diagnosis not present

## 2017-01-09 DIAGNOSIS — Z510 Encounter for antineoplastic radiation therapy: Secondary | ICD-10-CM | POA: Diagnosis not present

## 2017-01-09 DIAGNOSIS — C541 Malignant neoplasm of endometrium: Secondary | ICD-10-CM | POA: Diagnosis not present

## 2017-01-10 DIAGNOSIS — Z510 Encounter for antineoplastic radiation therapy: Secondary | ICD-10-CM | POA: Diagnosis not present

## 2017-01-10 DIAGNOSIS — C541 Malignant neoplasm of endometrium: Secondary | ICD-10-CM | POA: Diagnosis not present

## 2017-01-10 DIAGNOSIS — C7982 Secondary malignant neoplasm of genital organs: Secondary | ICD-10-CM | POA: Diagnosis not present

## 2017-01-11 DIAGNOSIS — Z510 Encounter for antineoplastic radiation therapy: Secondary | ICD-10-CM | POA: Diagnosis not present

## 2017-01-11 DIAGNOSIS — C7982 Secondary malignant neoplasm of genital organs: Secondary | ICD-10-CM | POA: Diagnosis not present

## 2017-01-11 DIAGNOSIS — C541 Malignant neoplasm of endometrium: Secondary | ICD-10-CM | POA: Diagnosis not present

## 2017-01-12 DIAGNOSIS — C7982 Secondary malignant neoplasm of genital organs: Secondary | ICD-10-CM | POA: Diagnosis not present

## 2017-01-12 DIAGNOSIS — C541 Malignant neoplasm of endometrium: Secondary | ICD-10-CM | POA: Diagnosis not present

## 2017-01-12 DIAGNOSIS — Z510 Encounter for antineoplastic radiation therapy: Secondary | ICD-10-CM | POA: Diagnosis not present

## 2017-01-13 DIAGNOSIS — C7982 Secondary malignant neoplasm of genital organs: Secondary | ICD-10-CM | POA: Diagnosis not present

## 2017-01-13 DIAGNOSIS — C541 Malignant neoplasm of endometrium: Secondary | ICD-10-CM | POA: Diagnosis not present

## 2017-01-13 DIAGNOSIS — Z510 Encounter for antineoplastic radiation therapy: Secondary | ICD-10-CM | POA: Diagnosis not present

## 2017-01-16 DIAGNOSIS — Z510 Encounter for antineoplastic radiation therapy: Secondary | ICD-10-CM | POA: Diagnosis not present

## 2017-01-16 DIAGNOSIS — C541 Malignant neoplasm of endometrium: Secondary | ICD-10-CM | POA: Diagnosis not present

## 2017-01-16 DIAGNOSIS — C7982 Secondary malignant neoplasm of genital organs: Secondary | ICD-10-CM | POA: Diagnosis not present

## 2017-01-17 DIAGNOSIS — Z510 Encounter for antineoplastic radiation therapy: Secondary | ICD-10-CM | POA: Diagnosis not present

## 2017-01-17 DIAGNOSIS — C541 Malignant neoplasm of endometrium: Secondary | ICD-10-CM | POA: Diagnosis not present

## 2017-01-17 DIAGNOSIS — C7982 Secondary malignant neoplasm of genital organs: Secondary | ICD-10-CM | POA: Diagnosis not present

## 2017-01-18 DIAGNOSIS — Z510 Encounter for antineoplastic radiation therapy: Secondary | ICD-10-CM | POA: Diagnosis not present

## 2017-01-18 DIAGNOSIS — C7982 Secondary malignant neoplasm of genital organs: Secondary | ICD-10-CM | POA: Diagnosis not present

## 2017-01-18 DIAGNOSIS — C541 Malignant neoplasm of endometrium: Secondary | ICD-10-CM | POA: Diagnosis not present

## 2017-01-19 DIAGNOSIS — C7982 Secondary malignant neoplasm of genital organs: Secondary | ICD-10-CM | POA: Diagnosis not present

## 2017-01-19 DIAGNOSIS — Z510 Encounter for antineoplastic radiation therapy: Secondary | ICD-10-CM | POA: Diagnosis not present

## 2017-01-19 DIAGNOSIS — C541 Malignant neoplasm of endometrium: Secondary | ICD-10-CM | POA: Diagnosis not present

## 2017-01-20 DIAGNOSIS — C7982 Secondary malignant neoplasm of genital organs: Secondary | ICD-10-CM | POA: Diagnosis not present

## 2017-01-20 DIAGNOSIS — C541 Malignant neoplasm of endometrium: Secondary | ICD-10-CM | POA: Diagnosis not present

## 2017-01-20 DIAGNOSIS — Z510 Encounter for antineoplastic radiation therapy: Secondary | ICD-10-CM | POA: Diagnosis not present

## 2017-01-23 DIAGNOSIS — C541 Malignant neoplasm of endometrium: Secondary | ICD-10-CM | POA: Diagnosis not present

## 2017-01-23 DIAGNOSIS — C7982 Secondary malignant neoplasm of genital organs: Secondary | ICD-10-CM | POA: Diagnosis not present

## 2017-01-23 DIAGNOSIS — Z510 Encounter for antineoplastic radiation therapy: Secondary | ICD-10-CM | POA: Diagnosis not present

## 2017-01-24 DIAGNOSIS — C7982 Secondary malignant neoplasm of genital organs: Secondary | ICD-10-CM | POA: Diagnosis not present

## 2017-01-24 DIAGNOSIS — C541 Malignant neoplasm of endometrium: Secondary | ICD-10-CM | POA: Diagnosis not present

## 2017-01-24 DIAGNOSIS — Z510 Encounter for antineoplastic radiation therapy: Secondary | ICD-10-CM | POA: Diagnosis not present

## 2017-01-25 DIAGNOSIS — C541 Malignant neoplasm of endometrium: Secondary | ICD-10-CM | POA: Diagnosis not present

## 2017-01-25 DIAGNOSIS — Z510 Encounter for antineoplastic radiation therapy: Secondary | ICD-10-CM | POA: Diagnosis not present

## 2017-01-25 DIAGNOSIS — C7982 Secondary malignant neoplasm of genital organs: Secondary | ICD-10-CM | POA: Diagnosis not present

## 2017-01-26 DIAGNOSIS — C7982 Secondary malignant neoplasm of genital organs: Secondary | ICD-10-CM | POA: Diagnosis not present

## 2017-01-26 DIAGNOSIS — Z510 Encounter for antineoplastic radiation therapy: Secondary | ICD-10-CM | POA: Diagnosis not present

## 2017-01-26 DIAGNOSIS — C541 Malignant neoplasm of endometrium: Secondary | ICD-10-CM | POA: Diagnosis not present

## 2017-01-27 DIAGNOSIS — C541 Malignant neoplasm of endometrium: Secondary | ICD-10-CM | POA: Diagnosis not present

## 2017-01-27 DIAGNOSIS — Z510 Encounter for antineoplastic radiation therapy: Secondary | ICD-10-CM | POA: Diagnosis not present

## 2017-01-27 DIAGNOSIS — C7982 Secondary malignant neoplasm of genital organs: Secondary | ICD-10-CM | POA: Diagnosis not present

## 2017-01-30 DIAGNOSIS — Z510 Encounter for antineoplastic radiation therapy: Secondary | ICD-10-CM | POA: Diagnosis not present

## 2017-01-30 DIAGNOSIS — C541 Malignant neoplasm of endometrium: Secondary | ICD-10-CM | POA: Diagnosis not present

## 2017-01-30 DIAGNOSIS — C7982 Secondary malignant neoplasm of genital organs: Secondary | ICD-10-CM | POA: Diagnosis not present

## 2017-02-01 DIAGNOSIS — Z510 Encounter for antineoplastic radiation therapy: Secondary | ICD-10-CM | POA: Diagnosis not present

## 2017-02-01 DIAGNOSIS — C541 Malignant neoplasm of endometrium: Secondary | ICD-10-CM | POA: Diagnosis not present

## 2017-02-01 DIAGNOSIS — C7982 Secondary malignant neoplasm of genital organs: Secondary | ICD-10-CM | POA: Diagnosis not present

## 2017-02-02 DIAGNOSIS — C541 Malignant neoplasm of endometrium: Secondary | ICD-10-CM | POA: Diagnosis not present

## 2017-02-02 DIAGNOSIS — C7982 Secondary malignant neoplasm of genital organs: Secondary | ICD-10-CM | POA: Diagnosis not present

## 2017-02-02 DIAGNOSIS — Z510 Encounter for antineoplastic radiation therapy: Secondary | ICD-10-CM | POA: Diagnosis not present

## 2017-02-03 DIAGNOSIS — C7982 Secondary malignant neoplasm of genital organs: Secondary | ICD-10-CM | POA: Diagnosis not present

## 2017-02-03 DIAGNOSIS — C541 Malignant neoplasm of endometrium: Secondary | ICD-10-CM | POA: Diagnosis not present

## 2017-02-03 DIAGNOSIS — Z510 Encounter for antineoplastic radiation therapy: Secondary | ICD-10-CM | POA: Diagnosis not present

## 2017-02-24 ENCOUNTER — Other Ambulatory Visit: Payer: Self-pay | Admitting: Cardiology

## 2017-02-28 ENCOUNTER — Encounter: Payer: Self-pay | Admitting: Cardiology

## 2017-03-02 ENCOUNTER — Other Ambulatory Visit: Payer: Self-pay | Admitting: Cardiology

## 2017-03-09 ENCOUNTER — Ambulatory Visit (INDEPENDENT_AMBULATORY_CARE_PROVIDER_SITE_OTHER): Payer: Medicare Other | Admitting: Cardiology

## 2017-03-09 ENCOUNTER — Encounter: Payer: Self-pay | Admitting: Cardiology

## 2017-03-09 ENCOUNTER — Encounter (INDEPENDENT_AMBULATORY_CARE_PROVIDER_SITE_OTHER): Payer: Self-pay

## 2017-03-09 VITALS — BP 164/68 | HR 66 | Ht 66.0 in | Wt 173.0 lb

## 2017-03-09 DIAGNOSIS — I481 Persistent atrial fibrillation: Secondary | ICD-10-CM

## 2017-03-09 DIAGNOSIS — R600 Localized edema: Secondary | ICD-10-CM

## 2017-03-09 DIAGNOSIS — I10 Essential (primary) hypertension: Secondary | ICD-10-CM | POA: Diagnosis not present

## 2017-03-09 DIAGNOSIS — I4819 Other persistent atrial fibrillation: Secondary | ICD-10-CM

## 2017-03-09 NOTE — Progress Notes (Signed)
Cardiology Office Note    Date:  03/09/2017   ID:  Christina, Morgan 08-22-1933, MRN [id number]  PCP:  Kandice Hams, MD  Cardiologist:  Fransico Him, MD   Chief Complaint  Patient presents with  . Atrial Fibrillation  . Hypertension    History of Present Illness:  Christina Morgan is a 83 y.o. female with a hx of HTN, Hyperlipidemia and persistent atrial fibrillation on systemic anticoagulation. She is here today for followup and is doing well.She had a reoccurence of her endometrial CA and is s/p XRT.  She has not had any further breakthrough of atrial fibrillation. She denies chest pain, shortness of breath, PND, orthopnea, palpitations, syncope or dizziness. She has chronic RLE edema which is stable.     Past Medical History:  Diagnosis Date  . Atrial fibrillation ([place])    failed DCCV 05/2014 >> Flecainide started >> DCCV 7/15 sucessful;  f/u ETT neg for pro-arrhythmia >> recurrent AF/AFL >> Flecainide inc to 100 bid with repeat DCCV 9/15  . Cancer [facility])    endometrial ca  . Dyslipidemia   . Fracture of left humerus   . History of cardiovascular stress test    Lexiscan Myoview (06/2014): No ischemia or scar, not gated, low risk  . Hx of echocardiogram    Echo (05/02/14): EF 60% to 65%. No regional wall motion abnormalities. Mild AI, mildly dilated aortic root (37 mm), trivial MR, trivial TR  . Hyperlipidemia   . Hypertension   . Lipoma of skin    multiple  . Osteopenia     Past Surgical History:  Procedure Laterality Date  . CARDIOVERSION N/A 06/05/2014   Procedure: CARDIOVERSION;  Surgeon: Sueanne Margarita, MD;  Location: [place];  Service: Cardiovascular;  Laterality: N/A;  . CARDIOVERSION N/A 07/11/2014   Procedure: CARDIOVERSION;  Surgeon: Sueanne Margarita, MD;  Location: [facility] ENDOSCOPY;  Service: Cardiovascular;  Laterality: N/A;  . CARDIOVERSION N/A 09/04/2014   Procedure: CARDIOVERSION;  Surgeon: Sueanne Margarita, MD;  Location: [facility] ENDOSCOPY;  Service:  Cardiovascular;  Laterality: N/A;  . COLONOSCOPY  2008  . COLONOSCOPY N/A 05/20/2015   Procedure: COLONOSCOPY;  Surgeon: Ronald Lobo, MD;  Location: [facility] ENDOSCOPY;  Service: Endoscopy;  Laterality: N/A;  . ROBOTIC ASSISTED SUPRACERVICAL HYSTERECTOMY WITH BILATERAL SALPINGO OOPHERECTOMY  11/06/2012   and bilateral pelvic lymph node dissection    Current Medications: Current Meds  Medication Sig  . amLODipine (NORVASC) 5 MG tablet TAKE 1 TABLET BY MOUTH ONCE DAILY  . apixaban (ELIQUIS) 5 MG TABS tablet Take 5 mg by mouth 2 (two) times daily.  Marland Kitchen atenolol (TENORMIN) 50 MG tablet TAKE 1 TABLET BY MOUTH TWICE DAILY  . atorvastatin (LIPITOR) 10 MG tablet Take 10 mg by mouth daily at 6 PM.   . flecainide (TAMBOCOR) 100 MG tablet TAKE 1 TABLET BY MOUTH TWICE DAILY  . furosemide (LASIX) 40 MG tablet TAKE 1 TABLET BY MOUTH DAILY  . potassium chloride SA (K-DUR,KLOR-CON) 20 MEQ tablet TAKE 1 TABLET BY MOUTH DAILY    Allergies:   Penicillins   Social History   Social History  . Marital status: Widowed    Spouse name: N/A  . Number of children: N/A  . Years of education: N/A   Social History Main Topics  . Smoking status: Never Smoker  . Smokeless tobacco: Never Used  . Alcohol use No  . Drug use: No  . Sexual activity: Not Asked   Other Topics Concern  . None  Social History Narrative  . None     Family History:  The patient's family history includes Cancer (age of onset: 72) in her father; Coronary artery disease in her mother; Heart attack in her mother.   ROS:   Please see the history of present illness.    ROS All other systems reviewed and are negative.  No flowsheet data found.     PHYSICAL EXAM:   VS:  BP (!) 164/68   Pulse 66   Ht 5\' 6"  (1.676 m)   Wt 173 lb (78.5 kg)   SpO2 98%   BMI 27.92 kg/m    GEN: Well nourished, well developed, in no acute distress  HEENT: normal  Neck: no JVD, carotid bruits, or masses Cardiac: RRR; no murmurs, rubs, or gallops,no  edema.  Intact distal pulses bilaterally.  Respiratory:  clear to auscultation bilaterally, normal work of breathing GI: soft, nontender, nondistended, + BS MS: no deformity or atrophy  Skin: warm and dry, no rash Neuro:  Alert and Oriented x 3, Strength and sensation are intact Psych: euthymic mood, full affect  Wt Readings from Last 3 Encounters:  03/09/17 173 lb (78.5 kg)  09/05/16 180 lb 12.8 oz (82 kg)  03/09/16 177 lb (80.3 kg)      Studies/Labs Reviewed:   EKG:  EKG is not ordered today.    Recent Labs: 03/21/2016: ALT 13; BUN 22; Creat 0.82; Hemoglobin 13.2; Platelets 250; Potassium 4.3; Sodium 144   Lipid Panel    Component Value Date/Time   CHOL 175 03/21/2016 0915   TRIG 155 (H) 03/21/2016 0915   HDL 63 03/21/2016 0915   CHOLHDL 2.8 03/21/2016 0915   VLDL 31 (H) 03/21/2016 0915   LDLCALC 81 03/21/2016 0915    Additional studies/ records that were reviewed today include:  none    ASSESSMENT:    1. Persistent atrial fibrillation ([place])   2. Essential hypertension, benign   3. Edema extremities      PLAN:  In order of problems listed above:  1. Persistent atrial fibrillation - she is maintaining NSR.  She will continue on BB, flecainide and apixaban. I will check a BMET and CBC today. 2. HTN - BP is poorly controlled today on exam. She has not been check her BP at home.  I have asked her to check her BP daily for a week and call with results. She will continue on BB and amlodipine.  3. LE edema - this is controlled on diuretics. She will continue on Lasix.     Medication Adjustments/Labs and Tests Ordered: Current medicines are reviewed at length with the patient today.  Concerns regarding medicines are outlined above.  Medication changes, Labs and Tests ordered today are listed in the Patient Instructions below.  There are no Patient Instructions on file for this visit.   Signed, Fransico Him, MD  03/09/2017 1:13 PM    [facility] [address], [place],   [address] Phone: [phone]; Fax: [phone]

## 2017-03-09 NOTE — Patient Instructions (Signed)
Medication Instructions:  Your physician recommends that you continue on your current medications as directed. Please refer to the Current Medication list given to you today.   Labwork: TODAY: BMET, Mag  Testing/Procedures: None  Follow-Up: Your physician wants you to follow-up in: 6 months with Dr. Radford Pax. You will receive a reminder letter in the mail two months in advance. If you don't receive a letter, please call our office to schedule the follow-up appointment.   Any Other Special Instructions Will Be Listed Below (If Applicable). Please check your BLOOD PRESSURE daily for a week and call with results.    If you need a refill on your cardiac medications before your next appointment, please call your pharmacy.

## 2017-03-10 LAB — BASIC METABOLIC PANEL
BUN/Creatinine Ratio: 16 (ref 12–28)
BUN: 14 mg/dL (ref 8–27)
CO2: 29 mmol/L (ref 18–29)
Calcium: 9.1 mg/dL (ref 8.7–10.3)
Chloride: 100 mmol/L (ref 96–106)
Creatinine, Ser: 0.88 mg/dL (ref 0.57–1.00)
GFR calc Af Amer: 71 mL/min/{1.73_m2} (ref >59)
GFR calc non Af Amer: 62 mL/min/{1.73_m2} (ref >59)
Glucose: 99 mg/dL (ref 65–99)
Potassium: 3.7 mmol/L (ref 3.5–5.2)
Sodium: 143 mmol/L (ref 134–144)

## 2017-03-10 LAB — MAGNESIUM: Magnesium: 2.1 mg/dL (ref 1.6–2.3)

## 2017-03-27 DIAGNOSIS — Z923 Personal history of irradiation: Secondary | ICD-10-CM | POA: Diagnosis not present

## 2017-03-27 DIAGNOSIS — Z8544 Personal history of malignant neoplasm of other female genital organs: Secondary | ICD-10-CM | POA: Diagnosis not present

## 2017-03-27 DIAGNOSIS — C541 Malignant neoplasm of endometrium: Secondary | ICD-10-CM | POA: Diagnosis not present

## 2017-03-27 DIAGNOSIS — Z90722 Acquired absence of ovaries, bilateral: Secondary | ICD-10-CM | POA: Diagnosis not present

## 2017-03-27 DIAGNOSIS — Z9889 Other specified postprocedural states: Secondary | ICD-10-CM | POA: Diagnosis not present

## 2017-03-27 DIAGNOSIS — Z08 Encounter for follow-up examination after completed treatment for malignant neoplasm: Secondary | ICD-10-CM | POA: Diagnosis not present

## 2017-03-27 DIAGNOSIS — Z90710 Acquired absence of both cervix and uterus: Secondary | ICD-10-CM | POA: Diagnosis not present

## 2017-03-27 DIAGNOSIS — Z8542 Personal history of malignant neoplasm of other parts of uterus: Secondary | ICD-10-CM | POA: Diagnosis not present

## 2017-03-27 DIAGNOSIS — Z9079 Acquired absence of other genital organ(s): Secondary | ICD-10-CM | POA: Diagnosis not present

## 2017-04-03 ENCOUNTER — Other Ambulatory Visit: Payer: Self-pay | Admitting: Cardiology

## 2017-04-21 DIAGNOSIS — H2513 Age-related nuclear cataract, bilateral: Secondary | ICD-10-CM | POA: Diagnosis not present

## 2017-05-25 DIAGNOSIS — C55 Malignant neoplasm of uterus, part unspecified: Secondary | ICD-10-CM | POA: Diagnosis not present

## 2017-05-25 DIAGNOSIS — E7800 Pure hypercholesterolemia, unspecified: Secondary | ICD-10-CM | POA: Diagnosis not present

## 2017-05-25 DIAGNOSIS — I10 Essential (primary) hypertension: Secondary | ICD-10-CM | POA: Diagnosis not present

## 2017-05-25 DIAGNOSIS — I4891 Unspecified atrial fibrillation: Secondary | ICD-10-CM | POA: Diagnosis not present

## 2017-05-25 DIAGNOSIS — R7301 Impaired fasting glucose: Secondary | ICD-10-CM | POA: Diagnosis not present

## 2017-06-26 DIAGNOSIS — Z90710 Acquired absence of both cervix and uterus: Secondary | ICD-10-CM | POA: Diagnosis not present

## 2017-06-26 DIAGNOSIS — Z9079 Acquired absence of other genital organ(s): Secondary | ICD-10-CM | POA: Diagnosis not present

## 2017-06-26 DIAGNOSIS — Z90722 Acquired absence of ovaries, bilateral: Secondary | ICD-10-CM | POA: Diagnosis not present

## 2017-06-26 DIAGNOSIS — Z08 Encounter for follow-up examination after completed treatment for malignant neoplasm: Secondary | ICD-10-CM | POA: Diagnosis not present

## 2017-06-26 DIAGNOSIS — Z8542 Personal history of malignant neoplasm of other parts of uterus: Secondary | ICD-10-CM | POA: Diagnosis not present

## 2017-06-26 DIAGNOSIS — C541 Malignant neoplasm of endometrium: Secondary | ICD-10-CM | POA: Diagnosis not present

## 2017-08-25 ENCOUNTER — Encounter: Payer: Self-pay | Admitting: Cardiology

## 2017-08-29 ENCOUNTER — Other Ambulatory Visit: Payer: Self-pay | Admitting: Cardiology

## 2017-09-05 ENCOUNTER — Encounter (INDEPENDENT_AMBULATORY_CARE_PROVIDER_SITE_OTHER): Payer: Self-pay

## 2017-09-05 ENCOUNTER — Encounter: Payer: Self-pay | Admitting: Cardiology

## 2017-09-05 ENCOUNTER — Ambulatory Visit (INDEPENDENT_AMBULATORY_CARE_PROVIDER_SITE_OTHER): Payer: Medicare Other | Admitting: Cardiology

## 2017-09-05 VITALS — BP 142/68 | HR 59 | Ht 66.0 in | Wt 172.8 lb

## 2017-09-05 DIAGNOSIS — R600 Localized edema: Secondary | ICD-10-CM

## 2017-09-05 DIAGNOSIS — I10 Essential (primary) hypertension: Secondary | ICD-10-CM | POA: Diagnosis not present

## 2017-09-05 DIAGNOSIS — I4819 Other persistent atrial fibrillation: Secondary | ICD-10-CM

## 2017-09-05 DIAGNOSIS — I481 Persistent atrial fibrillation: Secondary | ICD-10-CM | POA: Diagnosis not present

## 2017-09-05 NOTE — Patient Instructions (Signed)
Medication Instructions:  Your provider recommends that you continue on your current medications as directed. Please refer to the Current Medication list given to you today.    Labwork: TODAY: BMET, CBC  Testing/Procedures: None  Follow-Up: Your provider wants you to follow-up in: 6 months with Dr. Radford Pax. You will receive a reminder letter in the mail two months in advance. If you don't receive a letter, please call our office to schedule the follow-up appointment.    Any Other Special Instructions Will Be Listed Below (If Applicable).     If you need a refill on your cardiac medications before your next appointment, please call your pharmacy.

## 2017-09-05 NOTE — Progress Notes (Signed)
Cardiology Office Note:    Date:  09/05/2017   ID:  Christina Morgan, DOB 04-Jul-1933, MRN [id number]  PCP:  Seward Carol, MD  Cardiologist:  Fransico Him, MD   Referring MD: Seward Carol, MD   Chief Complaint  Patient presents with  . Hypertension  . Hyperlipidemia  . Atrial Fibrillation    History of Present Illness:    Christina Morgan is a 83 y.o. female with a hx of HTN, Hyperlipidemia, chronic LE edema and Persistent atrial fibrillation on systemic anticoagulation. She is here today for followup and is doing well.  She denies any chest pain or pressure, PND, orthopnea, LE edema, dizziness, palpitations or syncope.  She has chronic DOE which is stable and unchanged.   Past Medical History:  Diagnosis Date  . Atrial fibrillation ([place])    failed DCCV 05/2014 >> Flecainide started >> DCCV 7/15 sucessful;  f/u ETT neg for pro-arrhythmia >> recurrent AF/AFL >> Flecainide inc to 100 bid with repeat DCCV 9/15  . Cancer [facility])    endometrial ca  . Dyslipidemia   . Fracture of left humerus   . History of cardiovascular stress test    Lexiscan Myoview (06/2014): No ischemia or scar, not gated, low risk  . Hx of echocardiogram    Echo (05/02/14): EF 60% to 65%. No regional wall motion abnormalities. Mild AI, mildly dilated aortic root (37 mm), trivial MR, trivial TR  . Hyperlipidemia   . Hypertension   . Lipoma of skin    multiple  . Osteopenia     Past Surgical History:  Procedure Laterality Date  . CARDIOVERSION N/A 06/05/2014   Procedure: CARDIOVERSION;  Surgeon: Sueanne Margarita, MD;  Location: [place];  Service: Cardiovascular;  Laterality: N/A;  . CARDIOVERSION N/A 07/11/2014   Procedure: CARDIOVERSION;  Surgeon: Sueanne Margarita, MD;  Location: [facility] ENDOSCOPY;  Service: Cardiovascular;  Laterality: N/A;  . CARDIOVERSION N/A 09/04/2014   Procedure: CARDIOVERSION;  Surgeon: Sueanne Margarita, MD;  Location: [facility] ENDOSCOPY;  Service: Cardiovascular;  Laterality: N/A;  .  COLONOSCOPY  2008  . COLONOSCOPY N/A 05/20/2015   Procedure: COLONOSCOPY;  Surgeon: Ronald Lobo, MD;  Location: [facility] ENDOSCOPY;  Service: Endoscopy;  Laterality: N/A;  . ROBOTIC ASSISTED SUPRACERVICAL HYSTERECTOMY WITH BILATERAL SALPINGO OOPHERECTOMY  11/06/2012   and bilateral pelvic lymph node dissection    Current Medications: Current Meds  Medication Sig  . amLODipine (NORVASC) 5 MG tablet TAKE 1 TABLET BY MOUTH ONCE DAILY  . apixaban (ELIQUIS) 5 MG TABS tablet Take 5 mg by mouth 2 (two) times daily.  Marland Kitchen atenolol (TENORMIN) 50 MG tablet TAKE ONE (1) TABLET BY MOUTH TWO (2) TIMES DAILY  . atorvastatin (LIPITOR) 10 MG tablet Take 10 mg by mouth daily at 6 PM.   . flecainide (TAMBOCOR) 100 MG tablet TAKE 1 TABLET BY MOUTH TWICE DAILY  . furosemide (LASIX) 40 MG tablet TAKE 1 TABLET BY MOUTH DAILY  . potassium chloride SA (K-DUR,KLOR-CON) 20 MEQ tablet TAKE 1 TABLET BY MOUTH DAILY     Allergies:   Penicillins   Social History   Social History  . Marital status: Widowed    Spouse name: N/A  . Number of children: N/A  . Years of education: N/A   Social History Main Topics  . Smoking status: Never Smoker  . Smokeless tobacco: Never Used  . Alcohol use No  . Drug use: No  . Sexual activity: Not Asked   Other Topics Concern  . None  Social History Narrative  . None     Family History: The patient'sfamily history includes Cancer (age of onset: 48) in her father; Coronary artery disease in her mother; Heart attack in her mother.  ROS:   Please see the history of present illness.    ROS  All other systems reviewed and negative.   EKGs/Labs/Other Studies Reviewed:    The following studies were reviewed today: none  EKG:  EKG is ordered today.  The ekg ordered today demonstrates sinus bradycardia at 59bpm with ILBBB  Recent Labs: 03/09/2017: BUN 14; Creatinine, Ser 0.88; Magnesium 2.1; Potassium 3.7; Sodium 143   Recent Lipid Panel    Component Value Date/Time    CHOL 175 03/21/2016 0915   TRIG 155 (H) 03/21/2016 0915   HDL 63 03/21/2016 0915   CHOLHDL 2.8 03/21/2016 0915   VLDL 31 (H) 03/21/2016 0915   LDLCALC 81 03/21/2016 0915    Physical Exam:    VS:  BP (!) 142/68   Pulse (!) 59   Ht 5\' 6"  (1.676 m)   Wt 172 lb 12.8 oz (78.4 kg)   BMI 27.89 kg/m     Wt Readings from Last 3 Encounters:  09/05/17 172 lb 12.8 oz (78.4 kg)  03/09/17 173 lb (78.5 kg)  09/05/16 180 lb 12.8 oz (82 kg)     GEN:  Well nourished, well developed in no acute distress HEENT: Normal NECK: No JVD; No carotid bruits LYMPHATICS: No lymphadenopathy CARDIAC: RRR, no murmurs, rubs, gallops RESPIRATORY:  Clear to auscultation without rales, wheezing or rhonchi  ABDOMEN: Soft, non-tender, non-distended MUSCULOSKELETAL:  No edema; No deformity  SKIN: Warm and dry NEUROLOGIC:  Alert and oriented x 3 PSYCHIATRIC:  Normal affect   ASSESSMENT:    1. Persistent atrial fibrillation ([place])   2. Essential hypertension, benign   3. Edema extremities    PLAN:    In order of problems listed above:  1.  Persistent atrial fibrillation - she is maintaining NSR.  She will continue on Atenolol 50mg  daily, Flecainide 100mg  and apixaban 5mg  BID.  She is compliant with her meds and has no side effects.  I will check a BMET and CBC.   2.  HTN - her BP is well controlled on exam today.  She will continue on amlodipine 5mg  daily and Atenolol 50mg  daily  3.  Chronic LE edema - controlled on diuretics.  She will continue on Lasix 40mg  daily.     Medication Adjustments/Labs and Tests Ordered: Current medicines are reviewed at length with the patient today.  Concerns regarding medicines are outlined above.  No orders of the defined types were placed in this encounter.  No orders of the defined types were placed in this encounter.   Signed, Fransico Him, MD  09/05/2017 1:00 PM    [place]

## 2017-09-06 LAB — CBC WITH DIFFERENTIAL/PLATELET
Basophils Absolute: 0 10*3/uL (ref 0.0–0.2)
Basos: 0 %
EOS (ABSOLUTE): 0 10*3/uL (ref 0.0–0.4)
Eos: 1 %
Hematocrit: 39.8 % (ref 34.0–46.6)
Hemoglobin: 13.2 g/dL (ref 11.1–15.9)
Immature Grans (Abs): 0 10*3/uL (ref 0.0–0.1)
Immature Granulocytes: 0 %
Lymphocytes Absolute: 0.6 10*3/uL — ABNORMAL LOW (ref 0.7–3.1)
Lymphs: 14 %
MCH: 31.7 pg (ref 26.6–33.0)
MCHC: 33.2 g/dL (ref 31.5–35.7)
MCV: 95 fL (ref 79–97)
Monocytes Absolute: 0.5 10*3/uL (ref 0.1–0.9)
Monocytes: 11 %
Neutrophils Absolute: 3.2 10*3/uL (ref 1.4–7.0)
Neutrophils: 74 %
Platelets: 222 10*3/uL (ref 150–379)
RBC: 4.17 x10E6/uL (ref 3.77–5.28)
RDW: 14.5 % (ref 12.3–15.4)
WBC: 4.3 10*3/uL (ref 3.4–10.8)

## 2017-09-06 LAB — BASIC METABOLIC PANEL
BUN/Creatinine Ratio: 22 (ref 12–28)
BUN: 21 mg/dL (ref 8–27)
CO2: 25 mmol/L (ref 20–29)
Calcium: 8.9 mg/dL (ref 8.7–10.3)
Chloride: 101 mmol/L (ref 96–106)
Creatinine, Ser: 0.97 mg/dL (ref 0.57–1.00)
GFR calc Af Amer: 63 mL/min/{1.73_m2} (ref >59)
GFR calc non Af Amer: 55 mL/min/{1.73_m2} — ABNORMAL LOW (ref >59)
Glucose: 107 mg/dL — ABNORMAL HIGH (ref 65–99)
Potassium: 4 mmol/L (ref 3.5–5.2)
Sodium: 144 mmol/L (ref 134–144)

## 2017-09-18 DIAGNOSIS — M25552 Pain in left hip: Secondary | ICD-10-CM | POA: Diagnosis not present

## 2017-09-18 DIAGNOSIS — Z923 Personal history of irradiation: Secondary | ICD-10-CM | POA: Diagnosis not present

## 2017-09-18 DIAGNOSIS — Z08 Encounter for follow-up examination after completed treatment for malignant neoplasm: Secondary | ICD-10-CM | POA: Diagnosis not present

## 2017-09-18 DIAGNOSIS — Z9079 Acquired absence of other genital organ(s): Secondary | ICD-10-CM | POA: Diagnosis not present

## 2017-09-18 DIAGNOSIS — C541 Malignant neoplasm of endometrium: Secondary | ICD-10-CM | POA: Diagnosis not present

## 2017-09-18 DIAGNOSIS — Z90710 Acquired absence of both cervix and uterus: Secondary | ICD-10-CM | POA: Diagnosis not present

## 2017-09-18 DIAGNOSIS — Z8544 Personal history of malignant neoplasm of other female genital organs: Secondary | ICD-10-CM | POA: Diagnosis not present

## 2017-09-18 DIAGNOSIS — Z808 Family history of malignant neoplasm of other organs or systems: Secondary | ICD-10-CM | POA: Diagnosis not present

## 2017-09-18 DIAGNOSIS — Z8542 Personal history of malignant neoplasm of other parts of uterus: Secondary | ICD-10-CM | POA: Diagnosis not present

## 2017-09-18 DIAGNOSIS — Z9889 Other specified postprocedural states: Secondary | ICD-10-CM | POA: Diagnosis not present

## 2017-09-18 DIAGNOSIS — Z90722 Acquired absence of ovaries, bilateral: Secondary | ICD-10-CM | POA: Diagnosis not present

## 2017-09-21 ENCOUNTER — Other Ambulatory Visit: Payer: Self-pay | Admitting: Cardiology

## 2017-09-26 DIAGNOSIS — D1722 Benign lipomatous neoplasm of skin and subcutaneous tissue of left arm: Secondary | ICD-10-CM | POA: Diagnosis not present

## 2017-09-26 DIAGNOSIS — D3612 Benign neoplasm of peripheral nerves and autonomic nervous system, upper limb, including shoulder: Secondary | ICD-10-CM | POA: Diagnosis not present

## 2017-09-26 DIAGNOSIS — L570 Actinic keratosis: Secondary | ICD-10-CM | POA: Diagnosis not present

## 2017-09-26 DIAGNOSIS — D2262 Melanocytic nevi of left upper limb, including shoulder: Secondary | ICD-10-CM | POA: Diagnosis not present

## 2017-09-26 DIAGNOSIS — D3617 Benign neoplasm of peripheral nerves and autonomic nervous system of trunk, unspecified: Secondary | ICD-10-CM | POA: Diagnosis not present

## 2017-09-26 DIAGNOSIS — D485 Neoplasm of uncertain behavior of skin: Secondary | ICD-10-CM | POA: Diagnosis not present

## 2017-09-26 DIAGNOSIS — L720 Epidermal cyst: Secondary | ICD-10-CM | POA: Diagnosis not present

## 2017-09-26 DIAGNOSIS — L739 Follicular disorder, unspecified: Secondary | ICD-10-CM | POA: Diagnosis not present

## 2017-10-23 DIAGNOSIS — M545 Low back pain: Secondary | ICD-10-CM | POA: Diagnosis not present

## 2017-10-23 DIAGNOSIS — Z23 Encounter for immunization: Secondary | ICD-10-CM | POA: Diagnosis not present

## 2017-10-30 ENCOUNTER — Other Ambulatory Visit: Payer: Self-pay | Admitting: Cardiology

## 2017-11-29 DIAGNOSIS — M545 Low back pain: Secondary | ICD-10-CM | POA: Diagnosis not present

## 2017-12-18 ENCOUNTER — Other Ambulatory Visit: Payer: Self-pay | Admitting: Internal Medicine

## 2017-12-18 ENCOUNTER — Ambulatory Visit
Admission: RE | Admit: 2017-12-18 | Discharge: 2017-12-18 | Disposition: A | Discharge status: Home / Self Care | Payer: Medicare Other | Source: Ambulatory Visit | Attending: Internal Medicine | Admitting: Internal Medicine

## 2017-12-18 DIAGNOSIS — M545 Low back pain, unspecified: Secondary | ICD-10-CM

## 2017-12-18 DIAGNOSIS — Z1389 Encounter for screening for other disorder: Secondary | ICD-10-CM | POA: Diagnosis not present

## 2017-12-18 DIAGNOSIS — M48061 Spinal stenosis, lumbar region without neurogenic claudication: Secondary | ICD-10-CM | POA: Diagnosis not present

## 2017-12-18 DIAGNOSIS — I4891 Unspecified atrial fibrillation: Secondary | ICD-10-CM | POA: Diagnosis not present

## 2017-12-18 DIAGNOSIS — E7800 Pure hypercholesterolemia, unspecified: Secondary | ICD-10-CM | POA: Diagnosis not present

## 2017-12-18 DIAGNOSIS — I10 Essential (primary) hypertension: Secondary | ICD-10-CM | POA: Diagnosis not present

## 2017-12-18 DIAGNOSIS — Z0000 Encounter for general adult medical examination without abnormal findings: Secondary | ICD-10-CM | POA: Diagnosis not present

## 2017-12-22 ENCOUNTER — Ambulatory Visit
Admission: RE | Admit: 2017-12-22 | Discharge: 2017-12-22 | Disposition: A | Discharge status: Home / Self Care | Payer: Medicare Other | Source: Ambulatory Visit | Attending: Internal Medicine | Admitting: Internal Medicine

## 2017-12-22 ENCOUNTER — Other Ambulatory Visit: Payer: Self-pay | Admitting: Internal Medicine

## 2017-12-22 DIAGNOSIS — S32000S Wedge compression fracture of unspecified lumbar vertebra, sequela: Secondary | ICD-10-CM

## 2017-12-22 DIAGNOSIS — M48061 Spinal stenosis, lumbar region without neurogenic claudication: Secondary | ICD-10-CM | POA: Diagnosis not present

## 2018-01-22 DIAGNOSIS — Z808 Family history of malignant neoplasm of other organs or systems: Secondary | ICD-10-CM | POA: Diagnosis not present

## 2018-01-22 DIAGNOSIS — Z90722 Acquired absence of ovaries, bilateral: Secondary | ICD-10-CM | POA: Diagnosis not present

## 2018-01-22 DIAGNOSIS — Z8542 Personal history of malignant neoplasm of other parts of uterus: Secondary | ICD-10-CM | POA: Diagnosis not present

## 2018-01-22 DIAGNOSIS — I4891 Unspecified atrial fibrillation: Secondary | ICD-10-CM | POA: Diagnosis not present

## 2018-01-22 DIAGNOSIS — Z90710 Acquired absence of both cervix and uterus: Secondary | ICD-10-CM | POA: Diagnosis not present

## 2018-01-22 DIAGNOSIS — M8580 Other specified disorders of bone density and structure, unspecified site: Secondary | ICD-10-CM | POA: Diagnosis not present

## 2018-01-22 DIAGNOSIS — Z9079 Acquired absence of other genital organ(s): Secondary | ICD-10-CM | POA: Diagnosis not present

## 2018-01-22 DIAGNOSIS — Z08 Encounter for follow-up examination after completed treatment for malignant neoplasm: Secondary | ICD-10-CM | POA: Diagnosis not present

## 2018-01-22 DIAGNOSIS — Z923 Personal history of irradiation: Secondary | ICD-10-CM | POA: Diagnosis not present

## 2018-01-22 DIAGNOSIS — I10 Essential (primary) hypertension: Secondary | ICD-10-CM | POA: Diagnosis not present

## 2018-01-22 DIAGNOSIS — Z9889 Other specified postprocedural states: Secondary | ICD-10-CM | POA: Diagnosis not present

## 2018-02-27 ENCOUNTER — Other Ambulatory Visit: Payer: Self-pay | Admitting: Cardiology

## 2018-03-30 ENCOUNTER — Emergency Department (HOSPITAL_BASED_OUTPATIENT_CLINIC_OR_DEPARTMENT_OTHER)
Admit: 2018-03-30 | Discharge: 2018-03-30 | Disposition: A | Discharge status: Home / Self Care | Payer: Medicare Other | Attending: Emergency Medicine | Admitting: Emergency Medicine

## 2018-03-30 ENCOUNTER — Other Ambulatory Visit: Payer: Self-pay

## 2018-03-30 ENCOUNTER — Encounter (HOSPITAL_COMMUNITY): Payer: Self-pay | Admitting: Emergency Medicine

## 2018-03-30 ENCOUNTER — Emergency Department (HOSPITAL_COMMUNITY): Payer: Medicare Other

## 2018-03-30 ENCOUNTER — Inpatient Hospital Stay (HOSPITAL_COMMUNITY)
Admission: EM | Admit: 2018-03-30 | Discharge: 2018-04-02 | DRG: 602 | Disposition: A | Discharge status: Home / Self Care | Payer: Medicare Other | Attending: Internal Medicine | Admitting: Internal Medicine

## 2018-03-30 ENCOUNTER — Ambulatory Visit (INDEPENDENT_AMBULATORY_CARE_PROVIDER_SITE_OTHER): Payer: Medicare Other | Admitting: Cardiology

## 2018-03-30 VITALS — BP 140/62 | HR 56 | Ht 66.0 in | Wt 178.0 lb

## 2018-03-30 DIAGNOSIS — E782 Mixed hyperlipidemia: Secondary | ICD-10-CM

## 2018-03-30 DIAGNOSIS — I110 Hypertensive heart disease with heart failure: Secondary | ICD-10-CM | POA: Diagnosis not present

## 2018-03-30 DIAGNOSIS — Z79899 Other long term (current) drug therapy: Secondary | ICD-10-CM

## 2018-03-30 DIAGNOSIS — I10 Essential (primary) hypertension: Secondary | ICD-10-CM | POA: Diagnosis present

## 2018-03-30 DIAGNOSIS — C541 Malignant neoplasm of endometrium: Secondary | ICD-10-CM

## 2018-03-30 DIAGNOSIS — I4819 Other persistent atrial fibrillation: Secondary | ICD-10-CM | POA: Diagnosis present

## 2018-03-30 DIAGNOSIS — L03115 Cellulitis of right lower limb: Principal | ICD-10-CM | POA: Diagnosis present

## 2018-03-30 DIAGNOSIS — I481 Persistent atrial fibrillation: Secondary | ICD-10-CM

## 2018-03-30 DIAGNOSIS — I351 Nonrheumatic aortic (valve) insufficiency: Secondary | ICD-10-CM | POA: Diagnosis not present

## 2018-03-30 DIAGNOSIS — L03116 Cellulitis of left lower limb: Secondary | ICD-10-CM | POA: Diagnosis not present

## 2018-03-30 DIAGNOSIS — Z7901 Long term (current) use of anticoagulants: Secondary | ICD-10-CM | POA: Diagnosis not present

## 2018-03-30 DIAGNOSIS — D7589 Other specified diseases of blood and blood-forming organs: Secondary | ICD-10-CM | POA: Diagnosis not present

## 2018-03-30 DIAGNOSIS — I5033 Acute on chronic diastolic (congestive) heart failure: Secondary | ICD-10-CM | POA: Diagnosis present

## 2018-03-30 DIAGNOSIS — R600 Localized edema: Secondary | ICD-10-CM | POA: Diagnosis not present

## 2018-03-30 DIAGNOSIS — R0602 Shortness of breath: Secondary | ICD-10-CM | POA: Diagnosis not present

## 2018-03-30 DIAGNOSIS — L03119 Cellulitis of unspecified part of limb: Secondary | ICD-10-CM

## 2018-03-30 DIAGNOSIS — Z8542 Personal history of malignant neoplasm of other parts of uterus: Secondary | ICD-10-CM

## 2018-03-30 DIAGNOSIS — M7989 Other specified soft tissue disorders: Secondary | ICD-10-CM

## 2018-03-30 DIAGNOSIS — Z923 Personal history of irradiation: Secondary | ICD-10-CM | POA: Diagnosis not present

## 2018-03-30 DIAGNOSIS — E785 Hyperlipidemia, unspecified: Secondary | ICD-10-CM | POA: Diagnosis present

## 2018-03-30 DIAGNOSIS — I5032 Chronic diastolic (congestive) heart failure: Secondary | ICD-10-CM | POA: Diagnosis present

## 2018-03-30 LAB — CBC WITH DIFFERENTIAL/PLATELET
Basophils Absolute: 0 10*3/uL (ref 0.0–0.1)
Basophils Relative: 0 %
Eosinophils Absolute: 0 10*3/uL (ref 0.0–0.7)
Eosinophils Relative: 1 %
HCT: 41.2 % (ref 36.0–46.0)
Hemoglobin: 13.1 g/dL (ref 12.0–15.0)
Lymphocytes Relative: 16 %
Lymphs Abs: 0.8 10*3/uL (ref 0.7–4.0)
MCH: 32.3 pg (ref 26.0–34.0)
MCHC: 31.8 g/dL (ref 30.0–36.0)
MCV: 101.7 fL — ABNORMAL HIGH (ref 78.0–100.0)
Monocytes Absolute: 0.6 10*3/uL (ref 0.1–1.0)
Monocytes Relative: 12 %
Neutro Abs: 3.6 10*3/uL (ref 1.7–7.7)
Neutrophils Relative %: 71 %
Platelets: 225 10*3/uL (ref 150–400)
RBC: 4.05 MIL/uL (ref 3.87–5.11)
RDW: 13.3 % (ref 11.5–15.5)
WBC: 5 10*3/uL (ref 4.0–10.5)

## 2018-03-30 LAB — BRAIN NATRIURETIC PEPTIDE: B Natriuretic Peptide: 249.2 pg/mL — ABNORMAL HIGH (ref 0.0–100.0)

## 2018-03-30 LAB — BASIC METABOLIC PANEL
Anion gap: 9 (ref 5–15)
BUN: 16 mg/dL (ref 6–20)
CO2: 28 mmol/L (ref 22–32)
Calcium: 9.3 mg/dL (ref 8.9–10.3)
Chloride: 104 mmol/L (ref 101–111)
Creatinine, Ser: 1.04 mg/dL — ABNORMAL HIGH (ref 0.44–1.00)
GFR calc Af Amer: 56 mL/min — ABNORMAL LOW (ref >60)
GFR calc non Af Amer: 48 mL/min — ABNORMAL LOW (ref >60)
Glucose, Bld: 116 mg/dL — ABNORMAL HIGH (ref 65–99)
Potassium: 4 mmol/L (ref 3.5–5.1)
Sodium: 141 mmol/L (ref 135–145)

## 2018-03-30 LAB — HEPATIC FUNCTION PANEL
ALT: 15 U/L (ref 14–54)
AST: 20 U/L (ref 15–41)
Albumin: 3.7 g/dL (ref 3.5–5.0)
Alkaline Phosphatase: 105 U/L (ref 38–126)
Bilirubin, Direct: 0.1 mg/dL (ref 0.1–0.5)
Indirect Bilirubin: 0.7 mg/dL (ref 0.3–0.9)
Total Bilirubin: 0.8 mg/dL (ref 0.3–1.2)
Total Protein: 6.5 g/dL (ref 6.5–8.1)

## 2018-03-30 LAB — MAGNESIUM: Magnesium: 2.2 mg/dL (ref 1.7–2.4)

## 2018-03-30 LAB — I-STAT TROPONIN, ED: Troponin i, poc: 0.01 ng/mL (ref 0.00–0.08)

## 2018-03-30 MED ORDER — FUROSEMIDE 10 MG/ML IJ SOLN
40.0000 mg | Freq: Once | INTRAMUSCULAR | Status: AC
  Administered 2018-03-30: 40 mg via INTRAVENOUS
  Filled 2018-03-30: qty 4

## 2018-03-30 MED ORDER — CEFAZOLIN SODIUM-DEXTROSE 1-4 GM/50ML-% IV SOLN
1.0000 g | Freq: Three times a day (TID) | INTRAVENOUS | Status: DC
Start: 2018-03-31 — End: 2018-04-02
  Administered 2018-03-31 – 2018-04-02 (×7): 1 g via INTRAVENOUS
  Filled 2018-03-30 (×10): qty 50

## 2018-03-30 MED ORDER — APIXABAN 5 MG PO TABS
5.0000 mg | ORAL_TABLET | Freq: Two times a day (BID) | ORAL | Status: DC
  Administered 2018-03-30 – 2018-04-02 (×6): 5 mg via ORAL
  Filled 2018-03-30 (×6): qty 1

## 2018-03-30 MED ORDER — FUROSEMIDE 10 MG/ML IJ SOLN
40.0000 mg | Freq: Every day | INTRAMUSCULAR | Status: DC
  Administered 2018-03-31 – 2018-04-01 (×2): 40 mg via INTRAVENOUS
  Filled 2018-03-30 (×3): qty 4

## 2018-03-30 MED ORDER — AMLODIPINE BESYLATE 5 MG PO TABS
5.0000 mg | ORAL_TABLET | Freq: Every day | ORAL | Status: DC
  Administered 2018-03-31 – 2018-04-02 (×3): 5 mg via ORAL
  Filled 2018-03-30 (×3): qty 1

## 2018-03-30 MED ORDER — ACETAMINOPHEN 650 MG RE SUPP: 650.0000 mg | Freq: Four times a day (QID) | RECTAL | Status: DC | PRN

## 2018-03-30 MED ORDER — FLECAINIDE ACETATE 50 MG PO TABS
100.0000 mg | ORAL_TABLET | Freq: Two times a day (BID) | ORAL | Status: DC
  Administered 2018-03-30 – 2018-04-02 (×6): 100 mg via ORAL
  Filled 2018-03-30 (×6): qty 2

## 2018-03-30 MED ORDER — CEFAZOLIN SODIUM-DEXTROSE 1-4 GM/50ML-% IV SOLN
1.0000 g | Freq: Once | INTRAVENOUS | Status: AC
Start: 2018-03-30 — End: 2018-03-30
  Administered 2018-03-30: 1 g via INTRAVENOUS
  Filled 2018-03-30: qty 50

## 2018-03-30 MED ORDER — ATORVASTATIN CALCIUM 10 MG PO TABS
10.0000 mg | ORAL_TABLET | Freq: Every day | ORAL | Status: DC
  Administered 2018-03-30 – 2018-04-01 (×3): 10 mg via ORAL
  Filled 2018-03-30 (×3): qty 1

## 2018-03-30 MED ORDER — POTASSIUM CHLORIDE CRYS ER 20 MEQ PO TBCR
20.0000 meq | EXTENDED_RELEASE_TABLET | Freq: Every day | ORAL | Status: DC
  Administered 2018-03-30 – 2018-04-02 (×4): 20 meq via ORAL
  Filled 2018-03-30 (×4): qty 1

## 2018-03-30 MED ORDER — ACETAMINOPHEN 325 MG PO TABS: 650.0000 mg | ORAL_TABLET | Freq: Four times a day (QID) | ORAL | Status: DC | PRN

## 2018-03-30 MED ORDER — ATENOLOL 25 MG PO TABS
50.0000 mg | ORAL_TABLET | Freq: Two times a day (BID) | ORAL | Status: DC
  Administered 2018-03-30 – 2018-04-02 (×6): 50 mg via ORAL
  Filled 2018-03-30 (×7): qty 2

## 2018-03-30 MED ORDER — SODIUM CHLORIDE 0.9% FLUSH
3.0000 mL | Freq: Two times a day (BID) | INTRAVENOUS | Status: DC
  Administered 2018-03-30 – 2018-04-01 (×4): 3 mL via INTRAVENOUS

## 2018-03-30 MED ORDER — ALBUTEROL SULFATE (2.5 MG/3ML) 0.083% IN NEBU: 2.5000 mg | INHALATION_SOLUTION | RESPIRATORY_TRACT | Status: DC | PRN

## 2018-03-30 NOTE — Progress Notes (Signed)
Bilateral lower extremity venous duplex has been completed. Negative for DVT. 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  Results were given to Alferd Apa PA.  03/30/18 4:05 PM Christina Morgan RVT

## 2018-03-30 NOTE — H&P (Addendum)
History and Physical    ANGELYN OSTERBERG NAT:[id number] DOB: 1933/01/24 DOA: 03/30/2018  PCP: Seward Carol, MD   I have briefly reviewed patients previous medical reports in [facility].  Patient coming from: Home  Chief Complaint: Progressive swelling of both lower extremities, new onset of redness, progressive dyspnea on exertion.  HPI: Christina Morgan is a pleasant 84 year old female, lives alone, independent, PMH of HTN, HLD, persistent atrial fibrillation on Eliquis, chronic right lower extremity edema related to history of endometrial cancer status post hysterectomy and radiation treatment, presented to [facility] ED on 4/19 after she was sent from her visit with the cardiologist with above complaints.  Patient and daughter at bedside provided history.  Patient reports that she chronically has right lower extremity swelling but no left lower extremity swelling.  For the last 3 weeks, she has noted progressive swelling of her right lower extremity and left lower extremity.  She denies trauma, falls or injuries to the legs.  Today was the first time she noted redness of her legs.  Denies pain in her legs, fever or chills.  She reports same duration of dyspnea on exertion but no orthopnea, PND, chest pain, palpitations or cough.  No recent long distance travel.  Claims compliance with her medications.  However had not been fully compliant with low-salt diet and ate a lot of [employer] was on meals a week prior.  She reports gaining about 5 pounds over the last couple of weeks.  ED Course: Afebrile.  Blood pressure mildly elevated.  Other vital signs stable.  Lab work significant only for macrocytosis.  Chest x-ray shows cardiomegaly without acute disease.  BNP elevated.  She has received a dose of IV Ancef for presumed cellulitis of her legs and a dose of IV Lasix.  She states that her legs are starting to feel better already.  Review of Systems:  All other systems reviewed and apart from  HPI, are negative.  Past Medical History:  Diagnosis Date  . Atrial fibrillation ([place])    failed DCCV 05/2014 >> Flecainide started >> DCCV 7/15 sucessful;  f/u ETT neg for pro-arrhythmia >> recurrent AF/AFL >> Flecainide inc to 100 bid with repeat DCCV 9/15  . Cancer [facility])    endometrial ca  . Dyslipidemia   . Fracture of left humerus   . History of cardiovascular stress test    Lexiscan Myoview (06/2014): No ischemia or scar, not gated, low risk  . Hx of echocardiogram    Echo (05/02/14): EF 60% to 65%. No regional wall motion abnormalities. Mild AI, mildly dilated aortic root (37 mm), trivial MR, trivial TR  . Hyperlipidemia   . Hypertension   . Lipoma of skin    multiple  . Osteopenia     Past Surgical History:  Procedure Laterality Date  . CARDIOVERSION N/A 06/05/2014   Procedure: CARDIOVERSION;  Surgeon: Sueanne Margarita, MD;  Location: [place];  Service: Cardiovascular;  Laterality: N/A;  . CARDIOVERSION N/A 07/11/2014   Procedure: CARDIOVERSION;  Surgeon: Sueanne Margarita, MD;  Location: [facility] ENDOSCOPY;  Service: Cardiovascular;  Laterality: N/A;  . CARDIOVERSION N/A 09/04/2014   Procedure: CARDIOVERSION;  Surgeon: Sueanne Margarita, MD;  Location: [facility] ENDOSCOPY;  Service: Cardiovascular;  Laterality: N/A;  . COLONOSCOPY  2008  . COLONOSCOPY N/A 05/20/2015   Procedure: COLONOSCOPY;  Surgeon: Ronald Lobo, MD;  Location: [facility] ENDOSCOPY;  Service: Endoscopy;  Laterality: N/A;  . ROBOTIC ASSISTED SUPRACERVICAL HYSTERECTOMY WITH BILATERAL SALPINGO OOPHERECTOMY  11/06/2012  and bilateral pelvic lymph node dissection    Social History  reports that she has never smoked. She has never used smokeless tobacco. She reports that she does not drink alcohol or use drugs.  Allergies  Allergen Reactions  . Penicillins Other (See Comments)    Dizziness Has patient had a PCN reaction causing immediate rash, facial/tongue/throat swelling, SOB or lightheadedness with hypotension: No Has patient  had a PCN reaction causing severe rash involving mucus membranes or skin necrosis: No Has patient had a PCN reaction that required hospitalization: No Has patient had a PCN reaction occurring within the last 10 years: No If all of the above answers are "NO", then may proceed with Cephalosporin use.    Family History  Problem Relation Age of Onset  . Cancer Father 72       metastatic oropharyngeal ca  . Heart attack Mother   . Coronary artery disease Mother      Prior to Admission medications   Medication Sig Start Date End Date Taking? Authorizing Provider  amLODipine (NORVASC) 5 MG tablet TAKE 1 TABLET BY MOUTH ONCE DAILY 09/21/17  Yes Turner, Eber Hong, MD  apixaban (ELIQUIS) 5 MG TABS tablet Take 5 mg by mouth 2 (two) times daily.   Yes [provider]  atenolol (TENORMIN) 50 MG tablet TAKE ONE (1) TABLET BY MOUTH TWO (2) TIMES DAILY 04/04/17  Yes Turner, Traci R, MD  atorvastatin (LIPITOR) 10 MG tablet Take 10 mg by mouth at bedtime.  10/04/12  Yes [provider]  flecainide (TAMBOCOR) 100 MG tablet TAKE 1 TABLET BY MOUTH TWICE DAILY 02/28/18  Yes Turner, Eber Hong, MD  furosemide (LASIX) 40 MG tablet Take 1 tablet (40 mg total) by mouth daily. 10/31/17  Yes Turner, Eber Hong, MD  potassium chloride SA (K-DUR,KLOR-CON) 20 MEQ tablet Take 1 tablet (20 mEq total) by mouth daily. 10/31/17  Yes Sueanne Margarita, MD    Physical Exam: Vitals:   03/30/18 1700 03/30/18 1715 03/30/18 1730 03/30/18 1745  BP: (!) 168/68 (!) 166/68 (!) 162/68 (!) 142/61  Pulse: 63 62 60 (!) 55  Resp: 15 15 18 15   Temp:      TempSrc:      SpO2: 99% 100% 99% 98%  Weight:      Height:          Constitutional: Pleasant elderly female, moderately built and nourished lying comfortably propped up in bed. Eyes: PERTLA, lids and conjunctivae normal ENMT: Mucous membranes are moist. Posterior pharynx clear of any exudate or lesions. Normal dentition.  Neck: supple, no masses, no  thyromegaly Respiratory: clear to auscultation bilaterally, no wheezing, no crackles. Normal respiratory effort. No accessory muscle use.  Cardiovascular: S1 & S2 heard, regular rate and rhythm, no murmurs / rubs / gallops.  2+ pitting bilateral leg edema extending up to her knees, R>L. 2+ pedal pulses. No carotid bruits.  Telemetry personally reviewed: Sinus rhythm and occasional sinus bradycardia in the high 50s. Abdomen: No distension, no tenderness, no masses palpated. No hepatosplenomegaly. Bowel sounds normal.  Musculoskeletal: no clubbing / cyanosis. No joint deformity upper and lower extremities. Good ROM, no contractures. Normal muscle tone.  Skin: 2+ pitting bilateral leg edema, right > left.  Faint diffuse patchy redness with mild increase in warmth but no tenderness.  No obvious wounds or skin tears appreciated. Neurologic: CN 2-12 grossly intact. Sensation intact, DTR normal. Strength 5/5 in all 4 limbs.  Psychiatric: Normal judgment and insight. Alert and oriented x 3.  Normal mood.     Labs on Admission: I have personally reviewed following labs and imaging studies  CBC: Recent Labs  Lab 03/30/18 1112  WBC 5.0  NEUTROABS 3.6  HGB 13.1  HCT 41.2  MCV 101.7*  PLT 998   Basic Metabolic Panel: Recent Labs  Lab 03/30/18 1112  NA 141  K 4.0  CL 104  CO2 28  GLUCOSE 116*  BUN 16  CREATININE 1.04*  CALCIUM 9.3   Liver Function Tests: Recent Labs  Lab 03/30/18 1110  AST 20  ALT 15  ALKPHOS 105  BILITOT 0.8  PROT 6.5  ALBUMIN 3.7      Radiological Exams on Admission: Dg Chest 2 View  Result Date: 03/30/2018 CLINICAL DATA:  Recent onset of shortness of breath. EXAM: CHEST - 2 VIEW COMPARISON:  None. FINDINGS: Eventration of the right hemidiaphragm is noted. Lungs are clear. Heart size is mildly enlarged. No pneumothorax or pleural effusion. Aortic atherosclerosis is seen. Remote healed proximal left humerus fracture is identified. No acute bony abnormality.  IMPRESSION: Cardiomegaly without acute disease. Atherosclerosis. Electronically Signed   By: Inge Rise M.D.   On: 03/30/2018 16:33    EKG: Independently reviewed.  Sinus rhythm at 62 bpm, normal axis, no acute changes and QTC 500 ms.  Assessment/Plan Principal Problem:   Bilateral lower leg cellulitis Active Problems:   Endometrial ca [facility])   Essential hypertension, benign   Mixed hyperlipidemia   Persistent atrial fibrillation (HCC)   Acute on chronic diastolic CHF (congestive heart failure) ([place])     1. Possible bilateral leg cellulitis: Patient was sent from the cardiologist office due to marked lower extremity edema associated with increased warmth and erythema suspicious for cellulitis.  This is complicating chronic right lower extremity edema related to prior hysterectomy and inguinal lymph node dissection and radiation therapy.  Current findings may not be as severe as noted in the cardiologist office.  It is possible that her leg findings are due to significant edema/venous stasis alone.  Patient is also on amlodipine which may be contributing. Bilateral lower extremity venous Dopplers negative for DVT.  Continue IV Ancef per pharmacy for cellulitis and IV Lasix for edema.  Applied TED stockings.  2. Dyspnea/possible acute on chronic diastolic CHF: Chest x-ray negative for acute findings.  BNP 249.2.  TTE 05/02/14: LVEF 60-65%.  Recent sodium indiscretion.  Repeat 2D echo.  Strict intake output and daily weights.  IV Lasix 40 mg daily.  Bilateral lower extremity TED stockings.  Monitor closely on telemetry.  Low index of suspicion for PE given compliance with chronic Eliquis and also negative lower extremity venous Dopplers. 3. Persistent atrial fibrillation: Maintaining SR-SB.  Continue atenolol, flecainide and apixaban.  QTC is slightly prolonged.  Monitor on telemetry.  Potassium appropriate at 4.  Check magnesium and keep >2. Follow EKG in am. 4. Essential hypertension: Mildly  uncontrolled.  Continue atenolol and amlodipine.  May need to consider changing amlodipine to something else if lower extremity edema persists. 5. Hyperlipidemia: Continue statins. 6. Endometrial cancer status post hysterectomy and radiation treatment: Completed radiation treatment approximately 1 year ago.  Follows with oncology at [facility]. 7. Bilateral popliteal vein aneurysms: Noted on lower extremity venous Doppler.  Outpatient follow-up as deemed necessary.   DVT prophylaxis: On full dose Eliquis Code Status: Full Family Communication: Discussed in detail with patient's daughter at bedside Disposition Plan: DC home pending clinical improvement Consults called: None Admission status: Inpatient/telemetry.   Vernell Leep MD  [facility] Pager [phone]  If 7PM-7AM, please contact night-coverage [email or web address] Password [facility]  03/30/2018, 6:56 PM

## 2018-03-30 NOTE — Patient Instructions (Addendum)
Medication Instructions:  Your physician recommends that you continue on your current medications as directed. Please refer to the Current Medication list given to you today.  If you need a refill on your cardiac medications, please contact your pharmacy first.  Labwork: None ordered   Testing/Procedures: None ordered   Follow-Up: Your physician recommends that you schedule a follow-up appointment in: 3 months with Dr. Radford Pax   Any Other Special Instructions Will Be Listed Below (If Applicable). Dr. Radford Pax has recommended that you go to [facility] emergency room to be evaluated for your cellulitis and swelling   Thank you for choosing [place] [place]  If you need a refill on your cardiac medications before your next appointment, please call your pharmacy.

## 2018-03-30 NOTE — ED Notes (Signed)
Report attempted 

## 2018-03-30 NOTE — ED Provider Notes (Signed)
[place] EMERGENCY DEPARTMENT Provider Note   CSN: [id number] Arrival date & time: 03/30/18  1031     History   Chief Complaint Chief Complaint  Patient presents with  . Cellulitis    lower legs and feet    HPI Christina Morgan is a 84 y.o. female with a history of DM, atrial fibrillation on Eliquis, endometrial cancer, hypertension, hyperlipidemia who presents the emergency department today for bilateral lower extremity edema.  Patient was seen at her cardiologist's office today, Dr. Fransico Him and sent over for further evaluation.  Patient reports that over the last 3 weeks she has noticed bilateral lower extremity edema that is worse on the right.  Yesterday the area became very erythematous and warm to touch.  There is concern for cellulitis and she was sent over for IV antibiotics.  The patient also notes that she has been more short of breath of the last 3 weeks as well.  She notes she has increased salt in her diet eating [employer] frozen meals on a daily basis for their convenience.  She notes that the shortness of breath is typically only brought on with exertion.  She denies any shortness of breath at rest, orthopnea, PND, chest pain, cough, URI symptoms.  Patient has been compliant with her medications.  She denies history of MI or CVA.  She is a never smoker.  HPI  Past Medical History:  Diagnosis Date  . Atrial fibrillation ([place])    failed DCCV 05/2014 >> Flecainide started >> DCCV 7/15 sucessful;  f/u ETT neg for pro-arrhythmia >> recurrent AF/AFL >> Flecainide inc to 100 bid with repeat DCCV 9/15  . Cancer [facility])    endometrial ca  . Dyslipidemia   . Fracture of left humerus   . History of cardiovascular stress test    Lexiscan Myoview (06/2014): No ischemia or scar, not gated, low risk  . Hx of echocardiogram    Echo (05/02/14): EF 60% to 65%. No regional wall motion abnormalities. Mild AI, mildly dilated aortic root (37 mm), trivial MR, trivial  TR  . Hyperlipidemia   . Hypertension   . Lipoma of skin    multiple  . Osteopenia     Patient Active Problem List   Diagnosis Date Noted  . Edema extremities 08/19/2014  . Essential hypertension, benign 05/15/2014  . Mixed hyperlipidemia 05/15/2014  . Persistent atrial fibrillation ([place]) 05/15/2014  . Endometrial ca [facility]) 12/28/2012    Past Surgical History:  Procedure Laterality Date  . CARDIOVERSION N/A 06/05/2014   Procedure: CARDIOVERSION;  Surgeon: Sueanne Margarita, MD;  Location: [place];  Service: Cardiovascular;  Laterality: N/A;  . CARDIOVERSION N/A 07/11/2014   Procedure: CARDIOVERSION;  Surgeon: Sueanne Margarita, MD;  Location[facility] ENDOSCOPY;  Service: Cardiovascular;  Laterality: N/A;  . CARDIOVERSION N/A 09/04/2014   Procedure: CARDIOVERSION;  Surgeon: Sueanne Margarita, MD;  Location: [facility] ENDOSCOPY;  Service: Cardiovascular;  Laterality: N/A;  . COLONOSCOPY  2008  . COLONOSCOPY N/A 05/20/2015   Procedure: COLONOSCOPY;  Surgeon: Ronald Lobo, MD;  Location: [facility] ENDOSCOPY;  Service: Endoscopy;  Laterality: N/A;  . ROBOTIC ASSISTED SUPRACERVICAL HYSTERECTOMY WITH BILATERAL SALPINGO OOPHERECTOMY  11/06/2012   and bilateral pelvic lymph node dissection     OB History   None    Obstetric Comments  G2P2 with 2 vaginal deliveries. Reports menarche in her teens and menopause in her 47 s. Denies hx of abnormal pap smears. Patient reports last mammogram was 04/2012.  Home Medications    Prior to Admission medications   Medication Sig Start Date End Date Taking? Authorizing Provider  amLODipine (NORVASC) 5 MG tablet TAKE 1 TABLET BY MOUTH ONCE DAILY 09/21/17   Sueanne Margarita, MD  apixaban (ELIQUIS) 5 MG TABS tablet Take 5 mg by mouth 2 (two) times daily.    [provider]  atenolol (TENORMIN) 50 MG tablet TAKE ONE (1) TABLET BY MOUTH TWO (2) TIMES DAILY 04/04/17   Sueanne Margarita, MD  atorvastatin (LIPITOR) 10 MG tablet Take 10 mg by mouth daily at 6 PM.   10/04/12   [provider]  flecainide (TAMBOCOR) 100 MG tablet TAKE 1 TABLET BY MOUTH TWICE DAILY 02/28/18   Sueanne Margarita, MD  furosemide (LASIX) 40 MG tablet Take 1 tablet (40 mg total) by mouth daily. 10/31/17   Sueanne Margarita, MD  potassium chloride SA (K-DUR,KLOR-CON) 20 MEQ tablet Take 1 tablet (20 mEq total) by mouth daily. 10/31/17   Sueanne Margarita, MD    Family History Family History  Problem Relation Age of Onset  . Cancer Father 80       metastatic oropharyngeal ca  . Heart attack Mother   . Coronary artery disease Mother     Social History Social History   Tobacco Use  . Smoking status: Never Smoker  . Smokeless tobacco: Never Used  Substance Use Topics  . Alcohol use: No  . Drug use: No     Allergies   Penicillins   Review of Systems Review of Systems  All other systems reviewed and are negative.    Physical Exam Updated Vital Signs BP (!) 154/69   Pulse 61   Temp 98.3 F (36.8 C) (Oral)   Resp 15   Ht 5\' 6"  (1.676 m)   Wt 80.7 kg (178 lb)   SpO2 99%   BMI 28.73 kg/m   Physical Exam  Constitutional: She appears well-developed and well-nourished.  HENT:  Head: Normocephalic and atraumatic.  Right Ear: External ear normal.  Left Ear: External ear normal.  Nose: Nose normal.  Mouth/Throat: Uvula is midline, oropharynx is clear and moist and mucous membranes are normal. No tonsillar exudate.  Eyes: Pupils are equal, round, and reactive to light. Right eye exhibits no discharge. Left eye exhibits no discharge. No scleral icterus.  Neck: Trachea normal. Neck supple. No spinous process tenderness present. No neck rigidity. Normal range of motion present.  No JVD  Cardiovascular: Normal rate, regular rhythm and intact distal pulses.  No murmur heard. Pulses:      Radial pulses are 2+ on the right side, and 2+ on the left side.       Dorsalis pedis pulses are 2+ on the right side, and 2+ on the left side.       Posterior tibial pulses  are 2+ on the right side, and 2+ on the left side.  Patient with bilateral pitting edema that measures to the level of mid shin.  Right calf measures greater than left calf.  Pulmonary/Chest: Effort normal and breath sounds normal. She exhibits no tenderness.  Patient satting at 99% on room air with good waveform on monitor.  No signs of respiratory distress.  Able to speak in full sentences without difficulty.  Abdominal: Soft. Bowel sounds are normal. There is no tenderness. There is no rebound and no guarding.  Musculoskeletal: She exhibits no edema.  Lymphadenopathy:    She has no cervical adenopathy.  Neurological: She is alert.  Skin: Skin is warm and dry. No rash noted. She is not diaphoretic.  Patient with patchy areas of erythema and heat on the bilateral lower extremities.  There is no induration, draining, petechiae or purpura.  Psychiatric: She has a normal mood and affect.  Nursing note and vitals reviewed.   ED Treatments / Results  Labs (all labs ordered are listed, but only abnormal results are displayed) Labs Reviewed  CBC WITH DIFFERENTIAL/PLATELET - Abnormal; Notable for the following components:      Result Value   MCV 101.7 (*)    All other components within normal limits  BASIC METABOLIC PANEL - Abnormal; Notable for the following components:   Glucose, Bld 116 (*)    Creatinine, Ser 1.04 (*)    GFR calc non Af Amer 48 (*)    GFR calc Af Amer 56 (*)    All other components within normal limits  BRAIN NATRIURETIC PEPTIDE - Abnormal; Notable for the following components:   B Natriuretic Peptide 249.2 (*)    All other components within normal limits  HEPATIC FUNCTION PANEL  I-STAT TROPONIN, ED    EKG EKG Interpretation  Date/Time:  Friday March 30 2018 16:56:40 EDT Ventricular Rate:  62 PR Interval:    QRS Duration: 123 QT Interval:  492 QTC Calculation: 500 R Axis:   96 Text Interpretation:  Sinus rhythm Short PR interval Nonspecific intraventricular  conduction delay Borderline T abnormalities, anterior leads Baseline wander in lead(s) V3 No significant change since last tracing Confirmed by Orlie Dakin [phone]) on 03/30/2018 5:00:01 PM   Radiology Dg Chest 2 View  Result Date: 03/30/2018 CLINICAL DATA:  Recent onset of shortness of breath. EXAM: CHEST - 2 VIEW COMPARISON:  None. FINDINGS: Eventration of the right hemidiaphragm is noted. Lungs are clear. Heart size is mildly enlarged. No pneumothorax or pleural effusion. Aortic atherosclerosis is seen. Remote healed proximal left humerus fracture is identified. No acute bony abnormality. IMPRESSION: Cardiomegaly without acute disease. Atherosclerosis. Electronically Signed   By: Inge Rise M.D.   On: 03/30/2018 16:33    Procedures Procedures (including critical care time)  Medications Ordered in ED Medications  ceFAZolin (ANCEF) IVPB 1 g/50 mL premix (has no administration in time range)  furosemide (LASIX) injection 40 mg (has no administration in time range)     Initial Impression / Assessment and Plan / ED Course  I have reviewed the triage vital signs and the nursing notes.  Pertinent labs & imaging results that were available during my care of the patient were reviewed by me and considered in my medical decision making (see chart for details).     84 year old female presenting her cardiologist's office today for shortness of breath, bilateral lower extremity edema as well as shortness of breath with exertion.  [facility] recommended the patient be admitted for IV antibiotics for lower extremity cellulitis.  Patient notes that she has become more short of breath with exertion recently.  She notes increased salt intake in her diet that has led to shortness of breath as well as bilateral lower extremity edema.  On presentation the patient's vital signs are reassuring.  Patient's lungs are clear to auscultation bilaterally.  Heart is regular, rate, rhythm.  Patient does have  bilateral lower extremity swelling that is worse than the right.  There is cellulitic changes noted.  Labs, EKG and chest x-ray ordered.  Will start patient on IV antibiotics.  Spoke with Jerene Pitch of pharmacy who approved cefazolin.  EKG sinus rhythm.  No  significant change from priors.  No STEMI.  Troponin within normal limits. Patient without leukocytosis.  No anemia.  No significant electrolyte derangements.  No evidence of DKA.  Patient's creatinine mildly elevated at 1.04.  No acute kidney injury.  Patient's BNP is slightly elevated at 249.2.  Chest x-ray does show cardiomegaly without acute disease. Bilateral lower extremity doppler ultrasounds negative for DVT. Will give IV Lasix given lower extremity swelling and volume overload causing patients SOB.   Will consult hospitalist service for admission.    Dr. Algis Liming to admit the patient. Appears safe for admission.   Patient case seen and discussed with Dr. Winfred Leeds who is in agreement with plan.   Final Clinical Impressions(s) / ED Diagnoses   Final diagnoses:  Cellulitis of lower extremity, unspecified laterality  Bilateral lower extremity edema  Shortness of breath    ED Discharge Orders    None       Lorelle Gibbs 03/30/18 Darlin Drop    Orlie Dakin, MD 03/31/18 [phone]

## 2018-03-30 NOTE — ED Notes (Signed)
Sherry in main lab to add on BNP and Hepatic tests

## 2018-03-30 NOTE — Progress Notes (Signed)
Cardiology Office Note:    Date:  03/30/2018   ID:  Christina Morgan, DOB [birth year], MRN [id number]  PCP:  Seward Carol, MD  Cardiologist:  No primary care provider on file.    Referring MD: Seward Carol, MD   Chief Complaint  Patient presents with  . Atrial Fibrillation  . Hypertension    History of Present Illness:    Christina Morgan is a 84 y.o. female with a hx of HTN, Hyperlipidemia, chronic LE edemaand Persistent atrial fibrillationon systemic anticoagulation.  She is here today for followup.  She is here with her daughter today and has been having significant problems with lower extremity edema over the past 3 weeks.  Her legs have become very erythematous and warm to touch.  She is also noted increased shortness of breath recently.  She said she has been eating a lot of [employer] frozen meals but it has been about a week since she is eaten any.  She denies any chest pain or pressure, PND, orthopnea, dizziness, palpitations or syncope.  She has not had any fever or chills.  She is compliant with her meds and is tolerating meds with no SE.    Past Medical History:  Diagnosis Date  . Atrial fibrillation ([facility])    failed DCCV 05/2014 >> Flecainide started >> DCCV 7/15 sucessful;  f/u ETT neg for pro-arrhythmia >> recurrent AF/AFL >> Flecainide inc to 100 bid with repeat DCCV 9/15  . Cancer [facility])    endometrial ca  . Dyslipidemia   . Fracture of left humerus   . History of cardiovascular stress test    Lexiscan Myoview (06/2014): No ischemia or scar, not gated, low risk  . Hx of echocardiogram    Echo (05/02/14): EF 60% to 65%. No regional wall motion abnormalities. Mild AI, mildly dilated aortic root (37 mm), trivial MR, trivial TR  . Hyperlipidemia   . Hypertension   . Lipoma of skin    multiple  . Osteopenia     Past Surgical History:  Procedure Laterality Date  . CARDIOVERSION N/A 06/05/2014   Procedure: CARDIOVERSION;  Surgeon: Christina Margarita, MD;  Location:  [place];  Service: Cardiovascular;  Laterality: N/A;  . CARDIOVERSION N/A 07/11/2014   Procedure: CARDIOVERSION;  Surgeon: Christina Margarita, MD;  Location: [facility] ENDOSCOPY;  Service: Cardiovascular;  Laterality: N/A;  . CARDIOVERSION N/A 09/04/2014   Procedure: CARDIOVERSION;  Surgeon: Christina Margarita, MD;  Location: [facility] ENDOSCOPY;  Service: Cardiovascular;  Laterality: N/A;  . COLONOSCOPY  2008  . COLONOSCOPY N/A 05/20/2015   Procedure: COLONOSCOPY;  Surgeon: Ronald Lobo, MD;  Location: [facility] ENDOSCOPY;  Service: Endoscopy;  Laterality: N/A;  . ROBOTIC ASSISTED SUPRACERVICAL HYSTERECTOMY WITH BILATERAL SALPINGO OOPHERECTOMY  11/06/2012   and bilateral pelvic lymph node dissection    Current Medications: No outpatient medications have been marked as taking for the 03/30/18 encounter (Office Visit) with Christina Margarita, MD.     Allergies:   Penicillins   Social History   Socioeconomic History  . Marital status: Widowed    Spouse name: Not on file  . Number of children: Not on file  . Years of education: Not on file  . Highest education level: Not on file  Occupational History  . Not on file  Social Needs  . Financial resource strain: Not on file  . Food insecurity:    Worry: Not on file    Inability: Not on file  . Transportation needs:    Medical: Not  on file    Non-medical: Not on file  Tobacco Use  . Smoking status: Never Smoker  . Smokeless tobacco: Never Used  Substance and Sexual Activity  . Alcohol use: No  . Drug use: No  . Sexual activity: Not on file  Lifestyle  . Physical activity:    Days per week: Not on file    Minutes per session: Not on file  . Stress: Not on file  Relationships  . Social connections:    Talks on phone: Not on file    Gets together: Not on file    Attends religious service: Not on file    Active member of club or organization: Not on file    Attends meetings of clubs or organizations: Not on file    Relationship status: Not on file    Other Topics Concern  . Not on file  Social History Narrative  . Not on file     Family History: The patient's family history includes Cancer (age of onset: 17) in her father; Coronary artery disease in her mother; Heart attack in her mother.  ROS:   Please see the history of present illness.    ROS  All other systems reviewed and negative.   EKGs/Labs/Other Studies Reviewed:    The following studies were reviewed today:   EKG:  EKG is  ordered today.  The ekg ordered today demonstrates sinus bradycardia at 56bpm with 1st degree AVB, nonspecific IVCD  Recent Labs: 09/05/2017: BUN 21; Creatinine, Ser 0.97; Hemoglobin 13.2; Platelets 222; Potassium 4.0; Sodium 144   Recent Lipid Panel    Component Value Date/Time   CHOL 175 03/21/2016 0915   TRIG 155 (H) 03/21/2016 0915   HDL 63 03/21/2016 0915   CHOLHDL 2.8 03/21/2016 0915   VLDL 31 (H) 03/21/2016 0915   LDLCALC 81 03/21/2016 0915    Physical Exam:    VS:  BP 140/62   Pulse (!) 56   Ht 5\' 6"  (1.676 m)   Wt 178 lb (80.7 kg)   BMI 28.73 kg/m     Wt Readings from Last 3 Encounters:  03/30/18 178 lb (80.7 kg)  09/05/17 172 lb 12.8 oz (78.4 kg)  03/09/17 173 lb (78.5 kg)     GEN:  Well nourished, well developed in no acute distress HEENT: Normal NECK: No JVD; No carotid bruits LYMPHATICS: No lymphadenopathy CARDIAC: RRR, no murmurs, rubs, gallops RESPIRATORY:  Clear to auscultation without rales, wheezing or rhonchi  ABDOMEN: Soft, non-tender, non-distended MUSCULOSKELETAL:  2-3+ pitting edema; No deformity  SKIN: Very warm to touch and erythematous on both lower extremities. NEUROLOGIC:  Alert and oriented x 3 PSYCHIATRIC:  Normal affect   ASSESSMENT:    1. Persistent atrial fibrillation ([place])   2. Essential hypertension, benign   3. Mixed hyperlipidemia   4. Edema extremities    PLAN:    In order of problems listed above:  1.  Persistent atrial fibrillation - she is maintaining sinus bradycardia  on exam and EKG today.  She will continue on atenolol 50 mg daily, flecainide 100 mg twice daily and apixaban 5 mg twice daily.  Her creatinine was stable at 1.05 on 12/18/2017.   2.  Hypertension -her blood pressure is well controlled on exam today.  She will continue on amlodipine 5 mg daily, atenolol 50 mg daily.  3.  Hyperlipidemia -  lipids are followed by her PCP.  She will continue on atorvastatin 10 mg daily.  Her last LDL was 81  on 12/18/2017.  4.  LE edema -she has marked lower extremity pitting edema up to her knees on both lower extremities along with increased warmth and erythema.  I am suspicious she may have bilateral lower extremity cellulitis.  I have recommended that we send her over to the emergency room for admission by the hospitalist service for treatment of cellulitis and lower extremity edema.  She will likely need IV Lasix for the edema and antibiotics for the cellulitis.  Would also recommend lower extremity venous Dopplers although unlikely to have DVTs since she is on oral anticoagulation chronically.  5.  SOB -she has been having significant shortness of breath over the past couple of weeks likely due to volume overload from sodium indiscretion in her diet.  Her lungs are clear today.  Would recommend checking a BNP when she gets to the emergency room as well as a chest x-ray as well.  It is unlikely she has a pulmonary embolism given that she is on chronic anticoagulation.   Medication Adjustments/Labs and Tests Ordered: Current medicines are reviewed at length with the patient today.  Concerns regarding medicines are outlined above.  Orders Placed This Encounter  Procedures  . EKG 12-Lead   No orders of the defined types were placed in this encounter.   Signed, Fransico Him, MD  03/30/2018 9:51 AM    [place]

## 2018-03-30 NOTE — ED Notes (Signed)
Pt stated that Swan, stated that he would order her a food tray but did not see an order in and cafeteria was closed. Gave the Pt a [place] sandwich, applesauce and a sprite

## 2018-03-30 NOTE — ED Notes (Signed)
Patient transported to vascular. 

## 2018-03-30 NOTE — Progress Notes (Signed)
Pharmacy Antibiotic Note  Christina Morgan is a 84 y.o. female admitted on 03/30/2018 with cellulitis.  Pharmacy has been consulted for cefazolin dosing. Lower extremity edematous and warm. Doppler negative for DVT.  Plan: Cefazolin 1 gm IV every 8 hours. F/U LOT, renal function, and clinical progress.  Height: 5\' 6"  (167.6 cm) Weight: 178 lb (80.7 kg) IBW/kg (Calculated) : 59.3  Temp (24hrs), Avg:98.3 F (36.8 C), Min:98.3 F (36.8 C), Max:98.3 F (36.8 C)  Recent Labs  Lab 03/30/18 1112  WBC 5.0  CREATININE 1.04*    Estimated Creatinine Clearance: 43.9 mL/min (A) (by C-G formula based on SCr of 1.04 mg/dL (H)).    Allergies  Allergen Reactions  . Penicillins Other (See Comments)    Dizziness Has patient had a PCN reaction causing immediate rash, facial/tongue/throat swelling, SOB or lightheadedness with hypotension: No Has patient had a PCN reaction causing severe rash involving mucus membranes or skin necrosis: No Has patient had a PCN reaction that required hospitalization: No Has patient had a PCN reaction occurring within the last 10 years: No If all of the above answers are "NO", then may proceed with Cephalosporin use.    Antimicrobials this admission: Cefazolin 4/19 >>  Dose adjustments this admission: none  Microbiology results: none  Thank you for allowing pharmacy to be a part of this patient's care.  Orma Render 03/30/2018 6:58 PM

## 2018-03-30 NOTE — Progress Notes (Signed)
HEART

## 2018-03-30 NOTE — ED Notes (Signed)
Label sent to main lab to add on mg blood test.

## 2018-03-30 NOTE — ED Triage Notes (Signed)
Pt. Stated,I was sent here from office with cellulitis. She said I needed to be admitted.  Both lower legs and feet are red and swollen for a couple of days.

## 2018-03-30 NOTE — ED Provider Notes (Signed)
Planes of generalized weakness for the past 1 month.  She is also noted to have increased swelling in both legs over the past 3 weeks or so.  Denies orthopnea, sleeps on one pillow.  Sent here for hospital admission after seeing Dr. Radford Pax in the office today. Chest x-ray reviewed by me   Orlie Dakin, MD 03/30/18 1714

## 2018-03-31 ENCOUNTER — Inpatient Hospital Stay (HOSPITAL_COMMUNITY): Payer: Medicare Other

## 2018-03-31 DIAGNOSIS — I351 Nonrheumatic aortic (valve) insufficiency: Secondary | ICD-10-CM

## 2018-03-31 LAB — BASIC METABOLIC PANEL
Anion gap: 9 (ref 5–15)
BUN: 16 mg/dL (ref 6–20)
CO2: 27 mmol/L (ref 22–32)
Calcium: 8.5 mg/dL — ABNORMAL LOW (ref 8.9–10.3)
Chloride: 103 mmol/L (ref 101–111)
Creatinine, Ser: 1.03 mg/dL — ABNORMAL HIGH (ref 0.44–1.00)
GFR calc Af Amer: 57 mL/min — ABNORMAL LOW (ref >60)
GFR calc non Af Amer: 49 mL/min — ABNORMAL LOW (ref >60)
Glucose, Bld: 93 mg/dL (ref 65–99)
Potassium: 3.8 mmol/L (ref 3.5–5.1)
Sodium: 139 mmol/L (ref 135–145)

## 2018-03-31 LAB — ECHOCARDIOGRAM COMPLETE
Height: 66 in
Weight: 2788.8 oz

## 2018-03-31 NOTE — Progress Notes (Signed)
  Echocardiogram 2D Echocardiogram has been performed.  Christina Morgan 03/31/2018, 10:44 AM

## 2018-03-31 NOTE — Progress Notes (Addendum)
Patient ID: Christina Morgan, female   DOB: [birth year], 84 y.o.   MRN: [id number]  PROGRESS NOTE    Christina Morgan  WNU:[id number] DOB: 04/20/1933 DOA: 03/30/2018  PCP: Seward Carol, MD   Brief Narrative:  84 year old female, lives alone, independent, PMH of HTN, HLD, persistent atrial fibrillation on Eliquis, chronic right lower extremity edema related to history of endometrial cancer status post hysterectomy and radiation treatment, presented to [facility] ED on 4/19 after she was sent from her visit with the cardiologist with LE edema for the last 3 weeks and intermittent shortness of breath with exertion. She reported she gained about 5 pound over past few weeks.  Pt was hemodynamically stable on admission. BNP was mildly elevated in 200 range. Started on IV lasix 40 mg daily. Cardio consulted. For LE cellulitis she was started on ancef.   Assessment & Plan:   Principal Problem:   Bilateral lower leg cellulitis - Improved since admission with ancef - Continue current abx   Active Problems: Acute on chronic diastolic CHF (congestive heart failure) ([place]) - ECHO in 2015 with preserved EF - BNP mildly elevated on admission 249 - Started on IV lasix 40 mg daily - Per cardio follow up ECHO, based on result then consult cardio  - Continue daily weight and strict intake and output - Monitor daily Cr, stable renal function     Essential hypertension, benign - Continue Norvasc, atenolol, lasix    Mixed hyperlipidemia - Continue Lipitor 10 mg daily    Persistent atrial fibrillation ([facility]) - CHA2DS2-VASc Score4 - On AC with apixaban - Continue atenolol for rate control - Continue Flecainide for rhythm control      DVT prophylaxis: Apixaban  Code Status: full code  Family Communication: daughter at bedside  Disposition Plan:  Home once cellulitis better and once adequately diuresed   Consultants:   Cardiology  Procedures:   None  Antimicrobials:   Ancef 4/19  -->   Subjective: No overnight events.  Objective: Vitals:   03/30/18 2000 03/30/18 2030 03/30/18 2147 03/31/18 0548  BP:    (!) 147/58  Pulse: (!) 54 (!) 58 64 (!) 56  Resp: 14 (!) 22 16 19   Temp:   97.8 F (36.6 C) 98.4 F (36.9 C)  TempSrc:   Oral Oral  SpO2: 99% 99% 100% 95%  Weight:   79.4 kg (175 lb 1.6 oz) 79.1 kg (174 lb 4.8 oz)  Height:   5\' 6"  (1.676 m)     Intake/Output Summary (Last 24 hours) at 03/31/2018 0927 Last data filed at 03/31/2018 0809 Gross per 24 hour  Intake 318 ml  Output -  Net 318 ml   Filed Weights   03/30/18 1059 03/30/18 2147 03/31/18 0548  Weight: 80.7 kg (178 lb) 79.4 kg (175 lb 1.6 oz) 79.1 kg (174 lb 4.8 oz)    Examination:  General exam: Appears calm and comfortable  Respiratory system: Clear to auscultation. Respiratory effort normal. Cardiovascular system: S1 & S2 heard, RRR.  Gastrointestinal system: Abdomen is nondistended, soft and nontender. No organomegaly or masses felt. Normal bowel sounds heard. Central nervous system: Alert and oriented. No focal neurological deficits. Extremities: Symmetric 5 x 5 power. LE swelling, right more than left (+1 RLE edema) Skin: redness in lower extremities but per pt better Psychiatry: Judgement and insight appear normal. Mood & affect appropriate.   Data Reviewed: I have personally reviewed following labs and imaging studies  CBC: Recent Labs  Lab 03/30/18 1112  WBC 5.0  NEUTROABS 3.6  HGB 13.1  HCT 41.2  MCV 101.7*  PLT 485   Basic Metabolic Panel: Recent Labs  Lab 03/30/18 1112 03/30/18 1938 03/31/18 0258  NA 141  --  139  K 4.0  --  3.8  CL 104  --  103  CO2 28  --  27  GLUCOSE 116*  --  93  BUN 16  --  16  CREATININE 1.04*  --  1.03*  CALCIUM 9.3  --  8.5*  MG  --  2.2  --    GFR: Estimated Creatinine Clearance: 43.9 mL/min (A) (by C-G formula based on SCr of 1.03 mg/dL (H)). Liver Function Tests: Recent Labs  Lab 03/30/18 1110  AST 20  ALT 15  ALKPHOS 105   BILITOT 0.8  PROT 6.5  ALBUMIN 3.7   No results for input(s): LIPASE, AMYLASE in the last 168 hours. No results for input(s): AMMONIA in the last 168 hours. Coagulation Profile: No results for input(s): INR, PROTIME in the last 168 hours. Cardiac Enzymes: No results for input(s): CKTOTAL, CKMB, CKMBINDEX, TROPONINI in the last 168 hours. BNP (last 3 results) No results for input(s): PROBNP in the last 8760 hours. HbA1C: No results for input(s): HGBA1C in the last 72 hours. CBG: No results for input(s): GLUCAP in the last 168 hours. Lipid Profile: No results for input(s): CHOL, HDL, LDLCALC, TRIG, CHOLHDL, LDLDIRECT in the last 72 hours. Thyroid Function Tests: No results for input(s): TSH, T4TOTAL, FREET4, T3FREE, THYROIDAB in the last 72 hours. Anemia Panel: No results for input(s): VITAMINB12, FOLATE, FERRITIN, TIBC, IRON, RETICCTPCT in the last 72 hours.  Sepsis Labs: @LABRCNTIP (procalcitonin:4,lacticidven:4)   )No results found for this or any previous visit (from the past 240 hour(s)).    Radiology Studies: Dg Chest 2 View Result Date: 03/30/2018 Cardiomegaly without acute disease. Atherosclerosis. E   Scheduled Meds: . amLODipine  5 mg Oral Daily  . apixaban  5 mg Oral BID  . atenolol  50 mg Oral BID  . atorvastatin  10 mg Oral QHS  . flecainide  100 mg Oral BID  . furosemide  40 mg Intravenous Daily  . potassium chloride SA  20 mEq Oral Daily   Continuous Infusions: .  ceFAZolin (ANCEF) IV Stopped (03/31/18 0104)     LOS: 1 day    Time spent: 25 minutes  Greater than 50% of the time spent on counseling and coordinating the care.   Leisa Lenz, MD [facility] Pager [phone]  If 7PM-7AM, please contact night-coverage [email or web address] Password [facility] 03/31/2018, 9:27 AM

## 2018-04-01 LAB — BASIC METABOLIC PANEL
Anion gap: 6 (ref 5–15)
BUN: 20 mg/dL (ref 6–20)
CO2: 28 mmol/L (ref 22–32)
Calcium: 8.6 mg/dL — ABNORMAL LOW (ref 8.9–10.3)
Chloride: 105 mmol/L (ref 101–111)
Creatinine, Ser: 1.01 mg/dL — ABNORMAL HIGH (ref 0.44–1.00)
GFR calc Af Amer: 58 mL/min — ABNORMAL LOW (ref >60)
GFR calc non Af Amer: 50 mL/min — ABNORMAL LOW (ref >60)
Glucose, Bld: 104 mg/dL — ABNORMAL HIGH (ref 65–99)
Potassium: 3.9 mmol/L (ref 3.5–5.1)
Sodium: 139 mmol/L (ref 135–145)

## 2018-04-01 NOTE — Progress Notes (Signed)
Patient ID: Christina Morgan, female   DOB: 1933/10/01, 84 y.o.   MRN: [id number]  PROGRESS NOTE    Christina Morgan  HEN:[id number] DOB: 05-Jun-1933 DOA: 03/30/2018  PCP: Seward Carol, MD   Brief Narrative:  84 year old female, lives alone, independent, PMH of HTN, HLD, persistent atrial fibrillation on Eliquis, chronic right lower extremity edema related to history of endometrial cancer status post hysterectomy and radiation treatment, presented to [facility] ED on 4/19 after she was sent from her visit with the cardiologist with LE edema for the last 3 weeks and intermittent shortness of breath with exertion. She reported she gained about 5 pound over past few weeks.  Pt was hemodynamically stable on admission. BNP was mildly elevated in 200 range. Started on IV lasix 40 mg daily. Cardio consulted. For LE cellulitis she was started on ancef.   Assessment & Plan:   Principal Problem:   Bilateral lower leg cellulitis - Improving - Continue ancef   Active Problems: Acute on chronic diastolic CHF (congestive heart failure) ([place]) - ECHO in 2015 with preserved EF - BNP mildly elevated on admission 249 - Per cardio follow up ECHO. ECHO with preserved EF - Continue IV lasix daily - Will see with cardio tomorrow when she can possibly go home; official consultation in am     Essential hypertension, benign - Continue Norvasc, atenolol, lasix    Mixed hyperlipidemia - Continue Lipitor 10 mg daily    Persistent atrial fibrillation ([facility]) - CHA2DS2-VASc Score4 - On AC with apixaban - Continue atenolol for rate control - Continue Flecainide for rhythm control      DVT prophylaxis: Apixaban  Code Status: full code  Family Communication: no family at bedside this am Disposition Plan:  Home hopefully tomorrow   Consultants:   Cardiology  Procedures:   None  Antimicrobials:   Ancef 4/19 -->   Subjective: No overnight events.  Objective: Vitals:   03/31/18 0548 03/31/18  1934 04/01/18 0439 04/01/18 0605  BP: (!) 147/58 (!) 163/60 (!) 146/63 (!) 154/68  Pulse: (!) 56 66  (!) 57  Resp: 19 18 13 14   Temp: 98.4 F (36.9 C) (!) 97.4 F (36.3 C) 97.8 F (36.6 C) 98.3 F (36.8 C)  TempSrc: Oral Oral Oral Oral  SpO2: 95% 99% 96% 97%  Weight: 79.1 kg (174 lb 4.8 oz)   78.9 kg (174 lb)  Height:        Intake/Output Summary (Last 24 hours) at 04/01/2018 1128 Last data filed at 04/01/2018 0800 Gross per 24 hour  Intake 1110 ml  Output -  Net 1110 ml   Filed Weights   03/30/18 2147 03/31/18 0548 04/01/18 0605  Weight: 79.4 kg (175 lb 1.6 oz) 79.1 kg (174 lb 4.8 oz) 78.9 kg (174 lb)    Physical Exam  Constitutional: Appears well-developed and well-nourished. No distress.  Neck: Normal ROM. Neck supple. No JVD. No tracheal deviation. No thyromegaly.  CVS: RRR, S1/S2 + Pulmonary: Effort and breath sounds normal, no stridor, rhonchi, wheezes, rales.  Abdominal: Soft. BS +,  no distension, tenderness, rebound or guarding.  Musculoskeletal: Normal range of motion. No tenderness Lymphadenopathy: No lymphadenopathy noted, cervical, inguinal. Neuro: Alert. Normal reflexes, muscle tone coordination. No cranial nerve deficit. Skin: Skin is warm and dry. Redness in LE better, RLE > LLE, swelling improved  Psychiatric: Normal mood and affect. Behavior, judgment, thought content normal.     Data Reviewed: I have personally reviewed following labs and imaging studies  CBC: Recent Labs  Lab 03/30/18 1112  WBC 5.0  NEUTROABS 3.6  HGB 13.1  HCT 41.2  MCV 101.7*  PLT 637   Basic Metabolic Panel: Recent Labs  Lab 03/30/18 1112 03/30/18 1938 03/31/18 0258 04/01/18 0806  NA 141  --  139 139  K 4.0  --  3.8 3.9  CL 104  --  103 105  CO2 28  --  27 28  GLUCOSE 116*  --  93 104*  BUN 16  --  16 20  CREATININE 1.04*  --  1.03* 1.01*  CALCIUM 9.3  --  8.5* 8.6*  MG  --  2.2  --   --    GFR: Estimated Creatinine Clearance: 44.7 mL/min (A) (by C-G formula  based on SCr of 1.01 mg/dL (H)). Liver Function Tests: Recent Labs  Lab 03/30/18 1110  AST 20  ALT 15  ALKPHOS 105  BILITOT 0.8  PROT 6.5  ALBUMIN 3.7   No results for input(s): LIPASE, AMYLASE in the last 168 hours. No results for input(s): AMMONIA in the last 168 hours. Coagulation Profile: No results for input(s): INR, PROTIME in the last 168 hours. Cardiac Enzymes: No results for input(s): CKTOTAL, CKMB, CKMBINDEX, TROPONINI in the last 168 hours. BNP (last 3 results) No results for input(s): PROBNP in the last 8760 hours. HbA1C: No results for input(s): HGBA1C in the last 72 hours. CBG: No results for input(s): GLUCAP in the last 168 hours. Lipid Profile: No results for input(s): CHOL, HDL, LDLCALC, TRIG, CHOLHDL, LDLDIRECT in the last 72 hours. Thyroid Function Tests: No results for input(s): TSH, T4TOTAL, FREET4, T3FREE, THYROIDAB in the last 72 hours. Anemia Panel: No results for input(s): VITAMINB12, FOLATE, FERRITIN, TIBC, IRON, RETICCTPCT in the last 72 hours.  Sepsis Labs: @LABRCNTIP (procalcitonin:4,lacticidven:4)   )No results found for this or any previous visit (from the past 240 hour(s)).    Radiology Studies: Dg Chest 2 View Result Date: 03/30/2018 Cardiomegaly without acute disease. Atherosclerosis. E   Scheduled Meds: . amLODipine  5 mg Oral Daily  . apixaban  5 mg Oral BID  . atenolol  50 mg Oral BID  . atorvastatin  10 mg Oral QHS  . flecainide  100 mg Oral BID  . furosemide  40 mg Intravenous Daily  . potassium chloride SA  20 mEq Oral Daily   Continuous Infusions: .  ceFAZolin (ANCEF) IV 1 g (04/01/18 1000)     LOS: 2 days    Time spent: 25 minutes  Greater than 50% of the time spent on counseling and coordinating the care.   Leisa Lenz, MD [facility] Pager [phone]  If 7PM-7AM, please contact night-coverage [email or web address] Password [facility] 04/01/2018, 11:28 AM

## 2018-04-01 NOTE — Plan of Care (Signed)
  Problem: Education: Goal: Knowledge of General Education information will improve Outcome: Progressing   Problem: Education: Goal: Knowledge of General Education information will improve Outcome: Progressing   Problem: Clinical Measurements: Goal: Ability to maintain clinical measurements within normal limits will improve Outcome: Progressing Goal: Will remain free from infection Outcome: Progressing Goal: Diagnostic test results will improve Outcome: Progressing Goal: Respiratory complications will improve Outcome: Progressing Goal: Cardiovascular complication will be avoided Outcome: Progressing

## 2018-04-02 DIAGNOSIS — R600 Localized edema: Secondary | ICD-10-CM

## 2018-04-02 MED ORDER — AMLODIPINE BESYLATE 5 MG PO TABS: 5.0000 mg | ORAL_TABLET | Freq: Every day | ORAL | Status: DC

## 2018-04-02 MED ORDER — DOXYCYCLINE HYCLATE 100 MG PO TABS: 100.0000 mg | ORAL_TABLET | Freq: Two times a day (BID) | ORAL | 0 refills | Status: DC

## 2018-04-02 MED ORDER — AMLODIPINE BESYLATE 5 MG PO TABS: 7.5000 mg | ORAL_TABLET | Freq: Every day | ORAL | 0 refills | Status: DC

## 2018-04-02 MED ORDER — FUROSEMIDE 40 MG PO TABS: 40.0000 mg | ORAL_TABLET | Freq: Two times a day (BID) | ORAL | 0 refills | Status: DC

## 2018-04-02 MED ORDER — FUROSEMIDE 40 MG PO TABS: 40.0000 mg | ORAL_TABLET | Freq: Two times a day (BID) | ORAL | Status: DC

## 2018-04-02 MED ORDER — AMLODIPINE BESYLATE 5 MG PO TABS: 7.5000 mg | ORAL_TABLET | Freq: Every day | ORAL | Status: DC

## 2018-04-02 NOTE — Progress Notes (Signed)
Spoke with Dr. Radford Pax - Lasix switched to PO and Norvasc increased to 7.5. Paged Dr. Charlies Silvers to make her aware of changes to update AVS prior to discharge.   Fritz Pickerel, RN

## 2018-04-02 NOTE — Discharge Instructions (Signed)

## 2018-04-02 NOTE — Consult Note (Addendum)
Cardiology Consult    Patient ID: JAILYN LEESON MRN: [id number], DOB/AGE: January 02, 1933   Admit date: 03/30/2018 Date of Consult: 04/02/2018  Primary Physician: Seward Carol, MD Primary Cardiologist: Dr. Radford Pax  Requesting Provider: Dr. Charlies Silvers  Reason for Consultation: CHF  Christina Morgan is a 84 y.o. female who is being seen today for the evaluation of CHF at the request of Dr. Charlies Silvers.  Patient Profile    84 yo female with PMH of HTN, HL, chronic LE edema and PAF who presented with progressive dyspnea on exertion.   Past Medical History   Past Medical History:  Diagnosis Date  . Atrial fibrillation ([place])    failed DCCV 05/2014 >> Flecainide started >> DCCV 7/15 sucessful;  f/u ETT neg for pro-arrhythmia >> recurrent AF/AFL >> Flecainide inc to 100 bid with repeat DCCV 9/15  . Cancer [facility])    endometrial ca  . Dyslipidemia   . Fracture of left humerus   . History of cardiovascular stress test    Lexiscan Myoview (06/2014): No ischemia or scar, not gated, low risk  . Hx of echocardiogram    Echo (05/02/14): EF 60% to 65%. No regional wall motion abnormalities. Mild AI, mildly dilated aortic root (37 mm), trivial MR, trivial TR  . Hyperlipidemia   . Hypertension   . Lipoma of skin    multiple  . Osteopenia     Past Surgical History:  Procedure Laterality Date  . CARDIOVERSION N/A 06/05/2014   Procedure: CARDIOVERSION;  Surgeon: Sueanne Margarita, MD;  Location: [facility];  Service: Cardiovascular;  Laterality: N/A;  . CARDIOVERSION N/A 07/11/2014   Procedure: CARDIOVERSION;  Surgeon: Sueanne Margarita, MD;  Location: [facility] ENDOSCOPY;  Service: Cardiovascular;  Laterality: N/A;  . CARDIOVERSION N/A 09/04/2014   Procedure: CARDIOVERSION;  Surgeon: Sueanne Margarita, MD;  Location: [facility] ENDOSCOPY;  Service: Cardiovascular;  Laterality: N/A;  . COLONOSCOPY  2008  . COLONOSCOPY N/A 05/20/2015   Procedure: COLONOSCOPY;  Surgeon: Ronald Lobo, MD;  Location: [facility] ENDOSCOPY;  Service:  Endoscopy;  Laterality: N/A;  . ROBOTIC ASSISTED SUPRACERVICAL HYSTERECTOMY WITH BILATERAL SALPINGO OOPHERECTOMY  11/06/2012   and bilateral pelvic lymph node dissection     Allergies  Allergies  Allergen Reactions  . Penicillins Other (See Comments)    Dizziness Has patient had a PCN reaction causing immediate rash, facial/tongue/throat swelling, SOB or lightheadedness with hypotension: No Has patient had a PCN reaction causing severe rash involving mucus membranes or skin necrosis: No Has patient had a PCN reaction that required hospitalization: No Has patient had a PCN reaction occurring within the last 10 years: No If all of the above answers are "NO", then may proceed with Cephalosporin use.    History of Present Illness    Christina Morgan is an 84 yo female with PMH of HTN, HL, chronic LE edema and PAF. She has had several DCCVs in the past, and eventually placed on Flecainide 100mg  BID. Also has been on Eliquis 5mg  BID for [place]. She was last seen in the office on 03/30/18 with Dr. Radford Pax and reported significant LE edema, along with dyspnea on exertion. Stated she had been eating many frozen meals, but was compliant with her home medications. On physical exam she was noted to have pitting edema to her knees and possible cellulitis. It was recommended that she go to the ED for admission.   In the ED her labs showed stable electrolytes, BNP 249, Hgb 13.1, Trop neg. CXR without significant edema. EKG showed  SR with no acute ischemia. She was given a dose of IV ancef for possible cellulitis and IV lasix. She was admitted to [facility] for further diuresis. Bilateral LE dopplers were negative for DVT. Echo this admission showed EF of 55-60% with elevated ventricular filling pressures and elevated atrial filling pressures, with a trivial pericardial effusion.   Inpatient Medications    . amLODipine  5 mg Oral Daily  . apixaban  5 mg Oral BID  . atenolol  50 mg Oral BID  . atorvastatin  10 mg Oral  QHS  . flecainide  100 mg Oral BID  . furosemide  40 mg Intravenous Daily  . potassium chloride SA  20 mEq Oral Daily  . sodium chloride flush  3 mL Intravenous Q12H    Family History    Family History  Problem Relation Age of Onset  . Cancer Father 67       metastatic oropharyngeal ca  . Heart attack Mother   . Coronary artery disease Mother     Social History    Social History   Socioeconomic History  . Marital status: Widowed    Spouse name: Not on file  . Number of children: Not on file  . Years of education: Not on file  . Highest education level: Not on file  Occupational History  . Not on file  Social Needs  . Financial resource strain: Not on file  . Food insecurity:    Worry: Not on file    Inability: Not on file  . Transportation needs:    Medical: Not on file    Non-medical: Not on file  Tobacco Use  . Smoking status: Never Smoker  . Smokeless tobacco: Never Used  Substance and Sexual Activity  . Alcohol use: No  . Drug use: No  . Sexual activity: Not on file  Lifestyle  . Physical activity:    Days per week: Not on file    Minutes per session: Not on file  . Stress: Not on file  Relationships  . Social connections:    Talks on phone: Not on file    Gets together: Not on file    Attends religious service: Not on file    Active member of club or organization: Not on file    Attends meetings of clubs or organizations: Not on file    Relationship status: Not on file  . Intimate partner violence:    Fear of current or ex partner: Not on file    Emotionally abused: Not on file    Physically abused: Not on file    Forced sexual activity: Not on file  Other Topics Concern  . Not on file  Social History Narrative  . Not on file     Review of Systems    See HPI  All other systems reviewed and are otherwise negative except as noted above.  Physical Exam    Blood pressure (!) 150/60, pulse (!) 57, temperature 98 F (36.7 C), temperature  source Oral, resp. rate 15, height 5\' 6"  (1.676 m), weight 176 lb 8 oz (80.1 kg), SpO2 97 %.  General: Pleasant, older WF, NAD Psych: Normal affect. Neuro: Alert and oriented X 3. Moves all extremities spontaneously. HEENT: Normal  Neck: Supple, no JVD. Lungs:  Resp regular and unlabored, CTA. Heart: RRR no s3, s4, or murmurs. Abdomen: Soft, non-tender, non-distended, BS + x 4.  Extremities: No clubbing, cyanosis 1+ bilateral LE edema. DP/PT/Radials 2+ and equal bilaterally.  Labs  Troponin (Point of Care Test) Recent Labs    03/30/18 1606  TROPIPOC 0.01   No results for input(s): CKTOTAL, CKMB, TROPONINI in the last 72 hours. Lab Results  Component Value Date   WBC 5.0 03/30/2018   HGB 13.1 03/30/2018   HCT 41.2 03/30/2018   MCV 101.7 (H) 03/30/2018   PLT 225 03/30/2018    Recent Labs  Lab 03/30/18 1110  04/01/18 0806  NA  --    < > 139  K  --    < > 3.9  CL  --    < > 105  CO2  --    < > 28  BUN  --    < > 20  CREATININE  --    < > 1.01*  CALCIUM  --    < > 8.6*  PROT 6.5  --   --   BILITOT 0.8  --   --   ALKPHOS 105  --   --   ALT 15  --   --   AST 20  --   --   GLUCOSE  --    < > 104*   < > = values in this interval not displayed.   Lab Results  Component Value Date   CHOL 175 03/21/2016   HDL 63 03/21/2016   LDLCALC 81 03/21/2016   TRIG 155 (H) 03/21/2016   No results found for: [facility]   Radiology Studies    Dg Chest 2 View  Result Date: 03/30/2018 CLINICAL DATA:  Recent onset of shortness of breath. EXAM: CHEST - 2 VIEW COMPARISON:  None. FINDINGS: Eventration of the right hemidiaphragm is noted. Lungs are clear. Heart size is mildly enlarged. No pneumothorax or pleural effusion. Aortic atherosclerosis is seen. Remote healed proximal left humerus fracture is identified. No acute bony abnormality. IMPRESSION: Cardiomegaly without acute disease. Atherosclerosis. Electronically Signed   By: Inge Rise M.D.   On: 03/30/2018 16:33    ECG &  Cardiac Imaging    EKG:  The EKG was personally reviewed and demonstrates SR  Echo: 03/31/18  Study Conclusions  - Left ventricle: The cavity size was normal. Wall thickness was   normal. Systolic function was normal. The estimated ejection   fraction was in the range of 55% to 60%. Doppler parameters are   consistent with both elevated ventricular end-diastolic filling   pressure and elevated left atrial filling pressure. - Aortic valve: Calcified non coronary cusp. There was moderate   regurgitation. - Mitral valve: There was mild regurgitation. - Left atrium: The atrium was mildly dilated. - Atrial septum: No defect or patent foramen ovale was identified. - Pericardium, extracardiac: A trivial pericardial effusion was   identified posterior to the heart.  Assessment & Plan    84 yo female with PMH of HTN, HL, chronic LE edema and PAF who presented with progressive dyspnea on exertion.   1. Acute on Chronic diastolic HF: Reports dietary indiscretion prior to admission. Had been eating frozen meals as they were more convenient for her as she lives alone. Noticed her weight had been increasing slightly. Has been diuresed with IV lasix here, remains net + 2, and weight is is around 176lbs (thinks dry weight is 172lb) but LE edema has improved. She is hopeful to go home today. We discussed the importance of diet and Na+ restriction. Also with daily weights and when to call, or take extra lasix. I suspect her home dose of 40mg  lasix will be sufficient at  discharge as long as she sticks to her diet. Continue with TED hose. Echo with normal EF this admission.  2. Persistent Afib: ChadsVasc of at least 5. Has been on Eliquis, atenolol and flecainide.   3. HTN: Borderline controlled. If edema continues as outpatient, consider stopping Norvasc   4. HL: on statin  5. LE erythema: Was treated with IV antibiotics with improvement. Dopplers negative for DVT.   Barnet Pall,  NP-C Pager [phone] 04/02/2018, 11:56 AM   Patient seen and independently examined with Reino Bellis NP. We discussed all aspects of the encounter. I agree with the assessment and plan as stated above.  Patient well-known to me with a history of chronic lower extremity edema, hypertension and hyper lipidemia as well as paroxysmal atrial fibrillation.  She has had several cardioversions in the past and has been maintaining sinus rhythm since cardioversion in 2015 on flecainide 100 mg twice daily as well as atenolol.  She is on chronic anticoagulation with apixaban 5 mg twice daily for a CHADS2VASC score of 5 (age> 80, female, HTN and CHF).  She was seen in clinic by me last week complaining of increased weight gain as well as worsening lower extremity edema.  On exam she had erythema of both legs and edema up to her knees.  There was concern of possible cellulitis and she was sent to the emergency room and admitted by the hospitalist service.  She was placed on IV antibiotics as well as IV diuretics and diuresed well.  2D echocardiogram showed normal LV function with EF 55-60% with calcified noncoronary cusp and moderate aortic regurgitation, mild mitral regurgitation and mildly dilated left atrium.  There is also a trivial posterior pericardial effusion.  She is now ready to go home but hospitalists recommend a cardiology consult to get guidance on discharge medications for heart failure.  Eyes any chest pain or shortness of breath or lower extremity edema has resolved.  GEN: Well nourished, well developed in no acute distress HEENT: Normal NECK: No JVD; No carotid bruits LYMPHATICS: No lymphadenopathy CARDIAC:RRR, no murmurs, rubs, gallops RESPIRATORY:  Clear to auscultation without rales, wheezing or rhonchi  ABDOMEN: Soft, non-tender, non-distended MUSCULOSKELETAL:  No edema; No deformity  SKIN: Warm and dry NEUROLOGIC:  Alert and oriented x 3 PSYCHIATRIC:  Normal affect   She diuresed  300 cc in the past 24 hours but question whether this is  actually accurate.  She is net positive 2.6 L overall.  Weight is down 2 pounds from admission weight of 178.  Her dry weight is around 172 pounds by office visit from September 2018 when she was euvolemic.  She is eager to go home today.  She has been maintaining sinus bradycardia and should continue on flecainide 100 mg twice daily as well as atenolol 50 mg twice daily.  Her blood pressure is running slightly elevated but cannot increase BB due to bradycardia and she got increased LE edema on higher doses of amlodipine.  I have encouraged her to follow and low sodium diet and get a new BP cuff.  I have instructed her to check her BP BID for the next few days and bring her readings with her to her OV later this wee.  She should continue on Eliquis 5 mg twice daily.  In regards to diuretic therapy,  I would send her home on Lasix 40 mg twice daily since weight is still slightly above baseline with follow-up later this week in the office to adjust Lasix  as needed as well as repeat BMET.    Signed: Fransico Him MD [facility] 04/02/2018.

## 2018-04-02 NOTE — Discharge Summary (Addendum)
Physician Discharge Summary  Christina Morgan NID:[id number] DOB: [birth year] DOA: 03/30/2018  PCP: Seward Carol, MD  Admit date: 03/30/2018 Discharge date: 04/02/2018  Recommendations for Outpatient Follow-up:  1. Continue lasix 40 mg bid 2. Continue doxycycline for 3 more days on discharge   Discharge Diagnoses:  Principal Problem:   Bilateral lower leg cellulitis Active Problems:   Endometrial ca [facility])   Essential hypertension, benign   Mixed hyperlipidemia   Persistent atrial fibrillation (HCC)   Acute on chronic diastolic CHF (congestive heart failure) (HCC)    Discharge Condition: stable   Diet recommendation: as tolerated   History of present illness:  84 year old female, lives alone, independent, PMH of HTN, HLD, persistent atrial fibrillation on Eliquis, chronic right lower extremity edema related to history of endometrial cancer status post hysterectomy and radiation treatment, presented to [facility] ED on 4/19 after she was sent from her visit with the cardiologist with LE edema for the last 3 weeks and intermittent shortness of breath with exertion. She reported she gained about 5 pound over past few weeks.  Pt was hemodynamically stable on admission. BNP was mildly elevated in 200 range. Started on IV lasix 40 mg daily. Cardio consulted. For LE cellulitis she was started on ancef.   Hospital Course:  Assessment & Plan:   Principal Problem:   Bilateral lower leg cellulitis - Improving - Continue ancef through today - Doxycycline for 3 more days on discharge   Active Problems: Acute on chronic diastolic CHF (congestive heart failure) ([place]) - ECHO in 2015 with preserved EF - BNP mildly elevated on admission 249 - Per cardio follow up ECHO. ECHO with preserved EF - Continue lasix 40 mg twice daily  - Cardio okay for discharge today on home dose lasix     Essential hypertension, benign - Continue Norvasc, atenolol, lasix    Mixed hyperlipidemia - Continue  Lipitor 10 mg daily    Persistent atrial fibrillation (HCC) - CHA2DS2-VASc Score4 - On AC with apixaban - Continue atenolol for rate control - Continue Flecainide for rhythm control      DVT prophylaxis: Apixaban Code Status: full code Family communication: no family at the bedside this am   Consultants:   Cardiology  Procedures:   None  Antimicrobials:   Ancef 4/19 --> 4/22     Signed:  Leisa Lenz, MD  [facility] 04/02/2018, 1:49 PM  Pager #: [phone]  Time spent in minutes: 35 minutes   Discharge Exam: Vitals:   04/01/18 2035 04/02/18 0507  BP: (!) 146/66 (!) 150/60  Pulse: 65 (!) 57  Resp: 15 15  Temp: 98.4 F (36.9 C) 98 F (36.7 C)  SpO2: 94% 97%   Vitals:   04/01/18 0439 04/01/18 0605 04/01/18 2035 04/02/18 0507  BP: (!) 146/63 (!) 154/68 (!) 146/66 (!) 150/60  Pulse:  (!) 57 65 (!) 57  Resp: 13 14 15 15   Temp: 97.8 F (36.6 C) 98.3 F (36.8 C) 98.4 F (36.9 C) 98 F (36.7 C)  TempSrc: Oral Oral Oral Oral  SpO2: 96% 97% 94% 97%  Weight:  78.9 kg (174 lb)  80.1 kg (176 lb 8 oz)  Height:        General: Pt is alert, follows commands appropriately, not in acute distress Cardiovascular: Regular rate and rhythm, S1/S2 +, no murmurs Respiratory: Clear to auscultation bilaterally, no wheezing, no crackles, no rhonchi Abdominal: Soft, non tender, non distended, bowel sounds +, no guarding Extremities: no cyanosis, pulses palpable bilaterally DP and PT  Neuro: Grossly nonfocal  Discharge Instructions  Discharge Instructions    Call MD for:  difficulty breathing, headache or visual disturbances   Complete by:  As directed    Call MD for:  redness, tenderness, or signs of infection (pain, swelling, redness, odor or green/yellow discharge around incision site)   Complete by:  As directed    Call MD for:  severe uncontrolled pain   Complete by:  As directed    Diet - low sodium heart healthy   Complete by:  As directed     Increase activity slowly   Complete by:  As directed      Allergies as of 04/02/2018      Reactions   Penicillins Other (See Comments)   Dizziness Has patient had a PCN reaction causing immediate rash, facial/tongue/throat swelling, SOB or lightheadedness with hypotension: No Has patient had a PCN reaction causing severe rash involving mucus membranes or skin necrosis: No Has patient had a PCN reaction that required hospitalization: No Has patient had a PCN reaction occurring within the last 10 years: No If all of the above answers are "NO", then may proceed with Cephalosporin use.      Medication List    TAKE these medications   amLODipine 5 MG tablet Commonly known as:  NORVASC TAKE 1 TABLET BY MOUTH ONCE DAILY   atenolol 50 MG tablet Commonly known as:  TENORMIN TAKE ONE (1) TABLET BY MOUTH TWO (2) TIMES DAILY   atorvastatin 10 MG tablet Commonly known as:  LIPITOR Take 10 mg by mouth at bedtime.   doxycycline 100 MG tablet Commonly known as:  VIBRA-TABS Take 1 tablet (100 mg total) by mouth 2 (two) times daily.   ELIQUIS 5 MG Tabs tablet Generic drug:  apixaban Take 5 mg by mouth 2 (two) times daily.   flecainide 100 MG tablet Commonly known as:  TAMBOCOR TAKE 1 TABLET BY MOUTH TWICE DAILY   furosemide 40 MG tablet Commonly known as:  LASIX Take 1 tablet (40 mg total) by mouth daily.   potassium chloride SA 20 MEQ tablet Commonly known as:  K-DUR,KLOR-CON Take 1 tablet (20 mEq total) by mouth daily.      Follow-up Information    Seward Carol, MD. Schedule an appointment as soon as possible for a visit in 1 week(s).   Specialty:  Internal Medicine Contact information: [address] [place] [address] [phone]            The results of significant diagnostics from this hospitalization (including imaging, microbiology, ancillary and laboratory) are listed below for reference.    Significant Diagnostic Studies: Dg Chest  2 View  Result Date: 03/30/2018 CLINICAL DATA:  Recent onset of shortness of breath. EXAM: CHEST - 2 VIEW COMPARISON:  None. FINDINGS: Eventration of the right hemidiaphragm is noted. Lungs are clear. Heart size is mildly enlarged. No pneumothorax or pleural effusion. Aortic atherosclerosis is seen. Remote healed proximal left humerus fracture is identified. No acute bony abnormality. IMPRESSION: Cardiomegaly without acute disease. Atherosclerosis. Electronically Signed   By: Inge Rise M.D.   On: 03/30/2018 16:33    Microbiology: No results found for this or any previous visit (from the past 240 hour(s)).   Labs: Basic Metabolic Panel: Recent Labs  Lab 03/30/18 1112 03/30/18 1938 03/31/18 0258 04/01/18 0806  NA 141  --  139 139  K 4.0  --  3.8 3.9  CL 104  --  103 105  CO2 28  --  27 28  GLUCOSE 116*  --  93 104*  BUN 16  --  16 20  CREATININE 1.04*  --  1.03* 1.01*  CALCIUM 9.3  --  8.5* 8.6*  MG  --  2.2  --   --    Liver Function Tests: Recent Labs  Lab 03/30/18 1110  AST 20  ALT 15  ALKPHOS 105  BILITOT 0.8  PROT 6.5  ALBUMIN 3.7   No results for input(s): LIPASE, AMYLASE in the last 168 hours. No results for input(s): AMMONIA in the last 168 hours. CBC: Recent Labs  Lab 03/30/18 1112  WBC 5.0  NEUTROABS 3.6  HGB 13.1  HCT 41.2  MCV 101.7*  PLT 225   Cardiac Enzymes: No results for input(s): CKTOTAL, CKMB, CKMBINDEX, TROPONINI in the last 168 hours. BNP: BNP (last 3 results) Recent Labs    03/30/18 1110  BNP 249.2*    ProBNP (last 3 results) No results for input(s): PROBNP in the last 8760 hours.  CBG: No results for input(s): GLUCAP in the last 168 hours.

## 2018-04-04 ENCOUNTER — Ambulatory Visit (INDEPENDENT_AMBULATORY_CARE_PROVIDER_SITE_OTHER): Payer: Medicare Other | Admitting: Physician Assistant

## 2018-04-04 ENCOUNTER — Encounter: Payer: Self-pay | Admitting: Physician Assistant

## 2018-04-04 VITALS — BP 136/72 | HR 58 | Ht 66.0 in | Wt 176.0 lb

## 2018-04-04 DIAGNOSIS — I10 Essential (primary) hypertension: Secondary | ICD-10-CM | POA: Diagnosis not present

## 2018-04-04 DIAGNOSIS — E782 Mixed hyperlipidemia: Secondary | ICD-10-CM

## 2018-04-04 DIAGNOSIS — I480 Paroxysmal atrial fibrillation: Secondary | ICD-10-CM | POA: Diagnosis not present

## 2018-04-04 DIAGNOSIS — I5032 Chronic diastolic (congestive) heart failure: Secondary | ICD-10-CM

## 2018-04-04 DIAGNOSIS — L03116 Cellulitis of left lower limb: Secondary | ICD-10-CM

## 2018-04-04 DIAGNOSIS — L03115 Cellulitis of right lower limb: Secondary | ICD-10-CM

## 2018-04-04 LAB — BASIC METABOLIC PANEL
BUN/Creatinine Ratio: 19 (ref 12–28)
BUN: 20 mg/dL (ref 8–27)
CO2: 27 mmol/L (ref 20–29)
Calcium: 9.1 mg/dL (ref 8.7–10.3)
Chloride: 103 mmol/L (ref 96–106)
Creatinine, Ser: 1.07 mg/dL — ABNORMAL HIGH (ref 0.57–1.00)
GFR calc Af Amer: 55 mL/min/{1.73_m2} — ABNORMAL LOW (ref >59)
GFR calc non Af Amer: 48 mL/min/{1.73_m2} — ABNORMAL LOW (ref >59)
Glucose: 106 mg/dL — ABNORMAL HIGH (ref 65–99)
Potassium: 4.3 mmol/L (ref 3.5–5.2)
Sodium: 144 mmol/L (ref 134–144)

## 2018-04-04 NOTE — Progress Notes (Signed)
Cardiology Office Note    Date:  04/04/2018   ID:  Christina, Morgan 27-Aug-1933, MRN [id number]  PCP:  Seward Carol, MD  Cardiologist: Fransico Him, MD  No chief complaint on file.   History of Present Illness:  Christina Morgan is a 84 y.o. female with history of paroxysmal atrial fibrillation on Eliquis and flecainide, hypertension, hyperlipidemia, and chronic lower extremity edema.  She saw Dr. Radford Pax 03/30/18 with increased dyspnea on exertion leg edema and eating a lot of frozen meals.  She was sent to the emergency room for treatment of cellulitis as well as CHF.  BNP was 249, chest x-ray without significant edema Dopplers negative for DVT.  Echo LVEF 55-60% with elevated ventricular filling pressures and atrial filling pressures, trivial pericardial effusion.  Blood pressures were running a bit high and she was to check them at home.  Beta-blockers cannot be increased because of bradycardia and she has increased lower extremity edema on higher dose of of amlodipine.  She was sent home on Lasix 40 mg twice daily as weight was still above baseline of 172.  Patient comes in today accompanied by her daughter.  Her weights at home have run around 174 pounds.  She still has some dyspnea on exertion and mild edema.  She finishes her prescription for cellulitis today.  She is watching her salt closely.   Past Medical History:  Diagnosis Date  . Atrial fibrillation ([place])    failed DCCV 05/2014 >> Flecainide started >> DCCV 7/15 sucessful;  f/u ETT neg for pro-arrhythmia >> recurrent AF/AFL >> Flecainide inc to 100 bid with repeat DCCV 9/15  . Cancer [facility])    endometrial ca  . Dyslipidemia   . Fracture of left humerus   . History of cardiovascular stress test    Lexiscan Myoview (06/2014): No ischemia or scar, not gated, low risk  . Hx of echocardiogram    Echo (05/02/14): EF 60% to 65%. No regional wall motion abnormalities. Mild AI, mildly dilated aortic root (37 mm), trivial MR,  trivial TR  . Hyperlipidemia   . Hypertension   . Lipoma of skin    multiple  . Osteopenia     Past Surgical History:  Procedure Laterality Date  . CARDIOVERSION N/A 06/05/2014   Procedure: CARDIOVERSION;  Surgeon: Sueanne Margarita, MD;  Location: [place];  Service: Cardiovascular;  Laterality: N/A;  . CARDIOVERSION N/A 07/11/2014   Procedure: CARDIOVERSION;  Surgeon: Sueanne Margarita, MD;  Location: [facility] ENDOSCOPY;  Service: Cardiovascular;  Laterality: N/A;  . CARDIOVERSION N/A 09/04/2014   Procedure: CARDIOVERSION;  Surgeon: Sueanne Margarita, MD;  Location: [facility] ENDOSCOPY;  Service: Cardiovascular;  Laterality: N/A;  . COLONOSCOPY  2008  . COLONOSCOPY N/A 05/20/2015   Procedure: COLONOSCOPY;  Surgeon: Ronald Lobo, MD;  Location: [facility] ENDOSCOPY;  Service: Endoscopy;  Laterality: N/A;  . ROBOTIC ASSISTED SUPRACERVICAL HYSTERECTOMY WITH BILATERAL SALPINGO OOPHERECTOMY  11/06/2012   and bilateral pelvic lymph node dissection    Current Medications: Current Meds  Medication Sig  . amLODipine (NORVASC) 5 MG tablet Take 1.5 tablets (7.5 mg total) by mouth daily.  Marland Kitchen apixaban (ELIQUIS) 5 MG TABS tablet Take 5 mg by mouth 2 (two) times daily.  Marland Kitchen atenolol (TENORMIN) 50 MG tablet TAKE ONE (1) TABLET BY MOUTH TWO (2) TIMES DAILY  . atorvastatin (LIPITOR) 10 MG tablet Take 10 mg by mouth at bedtime.   Marland Kitchen doxycycline (VIBRA-TABS) 100 MG tablet Take 1 tablet (100 mg total) by mouth 2 (  two) times daily.  . flecainide (TAMBOCOR) 100 MG tablet TAKE 1 TABLET BY MOUTH TWICE DAILY  . furosemide (LASIX) 40 MG tablet Take 1 tablet (40 mg total) by mouth 2 (two) times daily.  . potassium chloride SA (K-DUR,KLOR-CON) 20 MEQ tablet Take 1 tablet (20 mEq total) by mouth daily.     Allergies:   Penicillins   Social History   Socioeconomic History  . Marital status: Widowed    Spouse name: Not on file  . Number of children: Not on file  . Years of education: Not on file  . Highest education level: Not on  file  Occupational History  . Not on file  Social Needs  . Financial resource strain: Not on file  . Food insecurity:    Worry: Not on file    Inability: Not on file  . Transportation needs:    Medical: Not on file    Non-medical: Not on file  Tobacco Use  . Smoking status: Never Smoker  . Smokeless tobacco: Never Used  Substance and Sexual Activity  . Alcohol use: No  . Drug use: No  . Sexual activity: Not on file  Lifestyle  . Physical activity:    Days per week: Not on file    Minutes per session: Not on file  . Stress: Not on file  Relationships  . Social connections:    Talks on phone: Not on file    Gets together: Not on file    Attends religious service: Not on file    Active member of club or organization: Not on file    Attends meetings of clubs or organizations: Not on file    Relationship status: Not on file  Other Topics Concern  . Not on file  Social History Narrative  . Not on file     Family History:  The patient's family history includes Cancer (age of onset: 52) in her father; Coronary artery disease in her mother; Heart attack in her mother.   ROS:   Please see the history of present illness.    Review of Systems  Constitution: Negative.  HENT: Negative.   Eyes: Negative.   Cardiovascular: Negative.   Respiratory: Negative.   Hematologic/Lymphatic: Negative.   Musculoskeletal: Negative.  Negative for joint pain.  Gastrointestinal: Negative.   Genitourinary: Negative.   Neurological: Negative.    All other systems reviewed and are negative.   PHYSICAL EXAM:   VS:  BP 136/72   Pulse (!) 58   Ht 5\' 6"  (1.676 m)   Wt 176 lb (79.8 kg)   SpO2 97%   BMI 28.41 kg/m   Physical Exam  GEN: Well nourished, well developed, in no acute distress  Neck: no JVD, carotid bruits, or masses Cardiac:RRR; 2/6 systolic murmur at the left sternal border Respiratory:  clear to auscultation bilaterally, normal work of breathing GI: soft, nontender,  nondistended, + BS Ext: Right edema mild still mild erythema, left trace of edema Neuro:  Alert and Oriented x 3,  Psych: euthymic mood, full affect  Wt Readings from Last 3 Encounters:  04/04/18 176 lb (79.8 kg)  04/02/18 176 lb 8 oz (80.1 kg)  03/30/18 178 lb (80.7 kg)      Studies/Labs Reviewed:   EKG:  EKG is not ordered today.   Recent Labs: 03/30/2018: ALT 15; B Natriuretic Peptide 249.2; Hemoglobin 13.1; Magnesium 2.2; Platelets 225 04/01/2018: BUN 20; Creatinine, Ser 1.01; Potassium 3.9; Sodium 139   Lipid Panel  Component Value Date/Time   CHOL 175 03/21/2016 0915   TRIG 155 (H) 03/21/2016 0915   HDL 63 03/21/2016 0915   CHOLHDL 2.8 03/21/2016 0915   VLDL 31 (H) 03/21/2016 0915   LDLCALC 81 03/21/2016 0915    Additional studies/ records that were reviewed today include:    Echo: 03/31/18   Study Conclusions   - Left ventricle: The cavity size was normal. Wall thickness was   normal. Systolic function was normal. The estimated ejection   fraction was in the range of 55% to 60%. Doppler parameters are   consistent with both elevated ventricular end-diastolic filling   pressure and elevated left atrial filling pressure. - Aortic valve: Calcified non coronary cusp. There was moderate   regurgitation. - Mitral valve: There was mild regurgitation. - Left atrium: The atrium was mildly dilated. - Atrial septum: No defect or patent foramen ovale was identified. - Pericardium, extracardiac: A trivial pericardial effusion was   identified posterior to the heart.   ASSESSMENT:    1. Chronic diastolic CHF (congestive heart failure) (HCC)   2. Paroxysmal atrial fibrillation ([place])   3. Essential hypertension, benign   4. Mixed hyperlipidemia   5. Bilateral lower leg cellulitis      PLAN:  In order of problems listed above:  Chronic diastolic CHF felt secondary to dietary indiscretion.  2D echo in the hospital normal systolic function with increased ventricular  end-diastolic filling pressures, moderate AI.  Discharge weight 176, dry weight felt to be 172 sent home on Lasix 40 mg twice daily.  Weight 176 on our scales 174 on her scales.  Still with some edema.  Will continue Lasix and potassium at current dose and check labs today.  Follow-up with Dr. Radford Pax in 3 months.  Paroxysmal atrial fibrillation maintaining normal sinus rhythm on flecainide and Eliquis for CHA2DS2-VASc of 5  Essential hypertension blood pressure well controlled  Mixed hyperlipidemia continue Lipitor  Bilateral lower extremity cellulitis treated with IV antibiotics and now finishing up doxycycline today.    Medication Adjustments/Labs and Tests Ordered: Current medicines are reviewed at length with the patient today.  Concerns regarding medicines are outlined above.  Medication changes, Labs and Tests ordered today are listed in the Patient Instructions below. Patient Instructions  Medication Instructions:  Your physician recommends that you continue on your current medications as directed. Please refer to the Current Medication list given to you today.   Labwork: TODAY: BMET  Testing/Procedures: None ordered  Follow-Up: Keep appointment scheduled with Dr. Radford Pax on 06/29/18 at 9:20 AM.  Any Other Special Instructions Will Be Listed Below (If Applicable).  Wear compressions stockings   If you need a refill on your cardiac medications before your next appointment, please call your pharmacy.     Sumner Boast, PA-C  04/04/2018 12:06 PM    [facility] [place], [place],   [address] Phone: [phone]; Fax: [phone]

## 2018-04-04 NOTE — Patient Instructions (Addendum)
Medication Instructions:  Your physician recommends that you continue on your current medications as directed. Please refer to the Current Medication list given to you today.   Labwork: TODAY: BMET  Testing/Procedures: None ordered  Follow-Up: Keep appointment scheduled with Dr. Radford Pax on 06/29/18 at 9:20 AM.  Any Other Special Instructions Will Be Listed Below (If Applicable).  Wear compressions stockings   If you need a refill on your cardiac medications before your next appointment, please call your pharmacy.

## 2018-04-05 ENCOUNTER — Other Ambulatory Visit: Payer: Self-pay | Admitting: Cardiology

## 2018-04-06 DIAGNOSIS — L0390 Cellulitis, unspecified: Secondary | ICD-10-CM | POA: Diagnosis not present

## 2018-04-06 DIAGNOSIS — R600 Localized edema: Secondary | ICD-10-CM | POA: Diagnosis not present

## 2018-04-11 ENCOUNTER — Ambulatory Visit: Payer: Medicare Other | Admitting: Nurse Practitioner

## 2018-04-23 DIAGNOSIS — Z90722 Acquired absence of ovaries, bilateral: Secondary | ICD-10-CM | POA: Diagnosis not present

## 2018-04-23 DIAGNOSIS — Z9889 Other specified postprocedural states: Secondary | ICD-10-CM | POA: Diagnosis not present

## 2018-04-23 DIAGNOSIS — Z8544 Personal history of malignant neoplasm of other female genital organs: Secondary | ICD-10-CM | POA: Diagnosis not present

## 2018-04-23 DIAGNOSIS — C541 Malignant neoplasm of endometrium: Secondary | ICD-10-CM | POA: Diagnosis not present

## 2018-04-23 DIAGNOSIS — Z08 Encounter for follow-up examination after completed treatment for malignant neoplasm: Secondary | ICD-10-CM | POA: Diagnosis not present

## 2018-04-23 DIAGNOSIS — Z8042 Family history of malignant neoplasm of prostate: Secondary | ICD-10-CM | POA: Diagnosis not present

## 2018-04-23 DIAGNOSIS — Z808 Family history of malignant neoplasm of other organs or systems: Secondary | ICD-10-CM | POA: Diagnosis not present

## 2018-04-23 DIAGNOSIS — Z9079 Acquired absence of other genital organ(s): Secondary | ICD-10-CM | POA: Diagnosis not present

## 2018-04-23 DIAGNOSIS — Z90710 Acquired absence of both cervix and uterus: Secondary | ICD-10-CM | POA: Diagnosis not present

## 2018-04-23 DIAGNOSIS — Z923 Personal history of irradiation: Secondary | ICD-10-CM | POA: Diagnosis not present

## 2018-06-18 DIAGNOSIS — R600 Localized edema: Secondary | ICD-10-CM | POA: Diagnosis not present

## 2018-06-18 DIAGNOSIS — E7800 Pure hypercholesterolemia, unspecified: Secondary | ICD-10-CM | POA: Diagnosis not present

## 2018-06-18 DIAGNOSIS — I4891 Unspecified atrial fibrillation: Secondary | ICD-10-CM | POA: Diagnosis not present

## 2018-06-18 DIAGNOSIS — I10 Essential (primary) hypertension: Secondary | ICD-10-CM | POA: Diagnosis not present

## 2018-06-18 DIAGNOSIS — R739 Hyperglycemia, unspecified: Secondary | ICD-10-CM | POA: Diagnosis not present

## 2018-06-29 ENCOUNTER — Encounter: Payer: Self-pay | Admitting: Cardiology

## 2018-06-29 ENCOUNTER — Ambulatory Visit (INDEPENDENT_AMBULATORY_CARE_PROVIDER_SITE_OTHER): Payer: Medicare Other | Admitting: Cardiology

## 2018-06-29 VITALS — BP 148/60 | HR 56 | Ht 66.0 in | Wt 170.8 lb

## 2018-06-29 DIAGNOSIS — I481 Persistent atrial fibrillation: Secondary | ICD-10-CM | POA: Diagnosis not present

## 2018-06-29 DIAGNOSIS — I4819 Other persistent atrial fibrillation: Secondary | ICD-10-CM

## 2018-06-29 DIAGNOSIS — I5032 Chronic diastolic (congestive) heart failure: Secondary | ICD-10-CM | POA: Diagnosis not present

## 2018-06-29 DIAGNOSIS — I10 Essential (primary) hypertension: Secondary | ICD-10-CM

## 2018-06-29 DIAGNOSIS — R600 Localized edema: Secondary | ICD-10-CM

## 2018-06-29 NOTE — Progress Notes (Signed)
Cardiology Office Note:    Date:  06/29/2018   ID:  Christina Morgan, DOB November 01, 1933, MRN [id number]  PCP:  Seward Carol, MD  Cardiologist:  Fransico Him, MD    Referring MD: Seward Carol, MD   Chief Complaint  Patient presents with  . Atrial Fibrillation  . Hypertension  . Congestive Heart Failure    History of Present Illness:    Christina Morgan is a 84 y.o. female with a hx of paroxysmal atrial fibrillation on Eliquis and flecainide, hypertension, hyperlipidemia, and chronic lower extremity edema.  She also has a history of chronic diastolic CHF with Echo showing LVEF 55-60% with elevated ventricular filling pressures and atrial filling pressures, trivial pericardial effusion and moderate aortic insufficiency..    She has chronic lower extremity edema.  She is here today for followup and is doing well.  She denies any chest pain or pressure, SOB, DOE, PND, orthopnea, LE edema, dizziness, palpitations or syncope. She is compliant with her meds and is tolerating meds with no SE.    Past Medical History:  Diagnosis Date  . Atrial fibrillation ([place])    failed DCCV 05/2014 >> Flecainide started >> DCCV 7/15 sucessful;  f/u ETT neg for pro-arrhythmia >> recurrent AF/AFL >> Flecainide inc to 100 bid with repeat DCCV 9/15  . Cancer [facility])    endometrial ca  . Dyslipidemia   . Fracture of left humerus   . History of cardiovascular stress test    Lexiscan Myoview (06/2014): No ischemia or scar, not gated, low risk  . Hx of echocardiogram    Echo (05/02/14): EF 60% to 65%. No regional wall motion abnormalities. Mild AI, mildly dilated aortic root (37 mm), trivial MR, trivial TR  . Hyperlipidemia   . Hypertension   . Lipoma of skin    multiple  . Osteopenia     Past Surgical History:  Procedure Laterality Date  . CARDIOVERSION N/A 06/05/2014   Procedure: CARDIOVERSION;  Surgeon: Sueanne Margarita, MD;  Location: [place];  Service: Cardiovascular;  Laterality: N/A;  .  CARDIOVERSION N/A 07/11/2014   Procedure: CARDIOVERSION;  Surgeon: Sueanne Margarita, MD;  Location: [facility] ENDOSCOPY;  Service: Cardiovascular;  Laterality: N/A;  . CARDIOVERSION N/A 09/04/2014   Procedure: CARDIOVERSION;  Surgeon: Sueanne Margarita, MD;  Location: [facility] ENDOSCOPY;  Service: Cardiovascular;  Laterality: N/A;  . COLONOSCOPY  2008  . COLONOSCOPY N/A 05/20/2015   Procedure: COLONOSCOPY;  Surgeon: Ronald Lobo, MD;  Location: [facility] ENDOSCOPY;  Service: Endoscopy;  Laterality: N/A;  . ROBOTIC ASSISTED SUPRACERVICAL HYSTERECTOMY WITH BILATERAL SALPINGO OOPHERECTOMY  11/06/2012   and bilateral pelvic lymph node dissection    Current Medications: Current Meds  Medication Sig  . amLODipine (NORVASC) 5 MG tablet Take 1.5 tablets (7.5 mg total) by mouth daily.  Marland Kitchen apixaban (ELIQUIS) 5 MG TABS tablet Take 5 mg by mouth 2 (two) times daily.  Marland Kitchen atenolol (TENORMIN) 50 MG tablet TAKE 1 TABLET BY MOUTH TWICE DAILY  . atorvastatin (LIPITOR) 10 MG tablet Take 10 mg by mouth at bedtime.   . flecainide (TAMBOCOR) 100 MG tablet TAKE 1 TABLET BY MOUTH TWICE DAILY  . furosemide (LASIX) 40 MG tablet Take 1 tablet (40 mg total) by mouth 2 (two) times daily.  . potassium chloride SA (K-DUR,KLOR-CON) 20 MEQ tablet Take 1 tablet (20 mEq total) by mouth daily.     Allergies:   Penicillins   Social History   Socioeconomic History  . Marital status: Widowed    Spouse  name: Not on file  . Number of children: Not on file  . Years of education: Not on file  . Highest education level: Not on file  Occupational History  . Not on file  Social Needs  . Financial resource strain: Not on file  . Food insecurity:    Worry: Not on file    Inability: Not on file  . Transportation needs:    Medical: Not on file    Non-medical: Not on file  Tobacco Use  . Smoking status: Never Smoker  . Smokeless tobacco: Never Used  Substance and Sexual Activity  . Alcohol use: No  . Drug use: No  . Sexual activity: Not on file    Lifestyle  . Physical activity:    Days per week: Not on file    Minutes per session: Not on file  . Stress: Not on file  Relationships  . Social connections:    Talks on phone: Not on file    Gets together: Not on file    Attends religious service: Not on file    Active member of club or organization: Not on file    Attends meetings of clubs or organizations: Not on file    Relationship status: Not on file  Other Topics Concern  . Not on file  Social History Narrative  . Not on file     Family History: The patient's family history includes Cancer (age of onset: 17) in her father; Coronary artery disease in her mother; Heart attack in her mother.  ROS:   Please see the history of present illness.    ROS  All other systems reviewed and negative.   EKGs/Labs/Other Studies Reviewed:    The following studies were reviewed today: none  EKG:  EKG is not ordered today.   Recent Labs: 03/30/2018: ALT 15; B Natriuretic Peptide 249.2; Hemoglobin 13.1; Magnesium 2.2; Platelets 225 04/04/2018: BUN 20; Creatinine, Ser 1.07; Potassium 4.3; Sodium 144   Recent Lipid Panel    Component Value Date/Time   CHOL 175 03/21/2016 0915   TRIG 155 (H) 03/21/2016 0915   HDL 63 03/21/2016 0915   CHOLHDL 2.8 03/21/2016 0915   VLDL 31 (H) 03/21/2016 0915   LDLCALC 81 03/21/2016 0915    Physical Exam:    VS:  BP (!) 148/60 (BP Location: Left Arm, Patient Position: Sitting, Cuff Size: Normal)   Pulse (!) 56   Ht 5\' 6"  (1.676 m)   Wt 170 lb 12.8 oz (77.5 kg)   SpO2 98%   BMI 27.57 kg/m     Wt Readings from Last 3 Encounters:  06/29/18 170 lb 12.8 oz (77.5 kg)  04/04/18 176 lb (79.8 kg)  04/02/18 176 lb 8 oz (80.1 kg)     GEN:  Well nourished, well developed in no acute distress HEENT: Normal NECK: No JVD; No carotid bruits LYMPHATICS: No lymphadenopathy CARDIAC: RRR, no murmurs, rubs, gallops RESPIRATORY:  Clear to auscultation without rales, wheezing or rhonchi  ABDOMEN: Soft,  non-tender, non-distended MUSCULOSKELETAL: 1+ right lower extremity 1 here mostly in the hospital in the hospital edema; No deformity  SKIN: Warm and dry NEUROLOGIC:  Alert and oriented x 3 PSYCHIATRIC:  Normal affect   ASSESSMENT:    1. Persistent atrial fibrillation ([place])   2. Essential hypertension, benign   3. Chronic diastolic heart failure ([place])   4. Edema extremities    PLAN:    In order of problems listed above:  1.  Persistent atrial fibrillation -she  is maintaining normal sinus rhythm on exam.  She will continue on atenolol 50 mg twice daily, flecainide 100 mg twice daily and Eliquis 5 mg twice daily.  Creatinine was stable at 1.16 and potassium 3.6 on 06/18/2018.  Her hemoglobin was also normal at 13.7.  2.  Hypertension -BP is borderline controlled on exam today but she brought her blood pressure readings from home which range from 616-073 systolic over 71G to 62I diastolic.Marland Kitchen  She will continue on atenolol 50 mg twice daily and amlodipine 7.5 mg daily.  3.  Chronic diastolic CHF -  she appears euvolemic on exam today.  She will continue on Lasix 40 mg twice daily.    4.  Chronic lower extremity edema -controlled on diuretics.  She is wearing compression hose.  She has chronic right lower extremity edema due to lymph node resection in the past but no edema in the left lower extremity.  she will continue on Lasix and potassium.   Medication Adjustments/Labs and Tests Ordered: Current medicines are reviewed at length with the patient today.  Concerns regarding medicines are outlined above.  No orders of the defined types were placed in this encounter.  No orders of the defined types were placed in this encounter.   Signed, Fransico Him, MD  06/29/2018 9:27 AM    [place]

## 2018-06-29 NOTE — Patient Instructions (Signed)

## 2018-07-23 DIAGNOSIS — Z9079 Acquired absence of other genital organ(s): Secondary | ICD-10-CM | POA: Diagnosis not present

## 2018-07-23 DIAGNOSIS — C541 Malignant neoplasm of endometrium: Secondary | ICD-10-CM | POA: Diagnosis not present

## 2018-07-23 DIAGNOSIS — Z90722 Acquired absence of ovaries, bilateral: Secondary | ICD-10-CM | POA: Diagnosis not present

## 2018-07-23 DIAGNOSIS — Z90710 Acquired absence of both cervix and uterus: Secondary | ICD-10-CM | POA: Diagnosis not present

## 2018-07-23 DIAGNOSIS — Z8542 Personal history of malignant neoplasm of other parts of uterus: Secondary | ICD-10-CM | POA: Diagnosis not present

## 2018-07-23 DIAGNOSIS — Z08 Encounter for follow-up examination after completed treatment for malignant neoplasm: Secondary | ICD-10-CM | POA: Diagnosis not present

## 2018-07-24 ENCOUNTER — Other Ambulatory Visit: Payer: Self-pay | Admitting: *Deleted

## 2018-07-24 MED ORDER — AMLODIPINE BESYLATE 5 MG PO TABS: 7.5000 mg | ORAL_TABLET | Freq: Every day | ORAL | 3 refills | Status: DC

## 2018-07-31 DIAGNOSIS — K7689 Other specified diseases of liver: Secondary | ICD-10-CM | POA: Diagnosis not present

## 2018-07-31 DIAGNOSIS — C541 Malignant neoplasm of endometrium: Secondary | ICD-10-CM | POA: Diagnosis not present

## 2018-07-31 DIAGNOSIS — N281 Cyst of kidney, acquired: Secondary | ICD-10-CM | POA: Diagnosis not present

## 2018-07-31 DIAGNOSIS — K5790 Diverticulosis of intestine, part unspecified, without perforation or abscess without bleeding: Secondary | ICD-10-CM | POA: Diagnosis not present

## 2018-07-31 DIAGNOSIS — K449 Diaphragmatic hernia without obstruction or gangrene: Secondary | ICD-10-CM | POA: Diagnosis not present

## 2018-08-06 ENCOUNTER — Other Ambulatory Visit: Payer: Self-pay | Admitting: Cardiology

## 2018-08-27 ENCOUNTER — Other Ambulatory Visit: Payer: Self-pay | Admitting: Cardiology

## 2018-09-07 ENCOUNTER — Other Ambulatory Visit: Payer: Self-pay | Admitting: Cardiology

## 2018-09-10 ENCOUNTER — Other Ambulatory Visit: Payer: Self-pay | Admitting: Internal Medicine

## 2018-09-10 DIAGNOSIS — Z1231 Encounter for screening mammogram for malignant neoplasm of breast: Secondary | ICD-10-CM

## 2018-09-11 DIAGNOSIS — Z23 Encounter for immunization: Secondary | ICD-10-CM | POA: Diagnosis not present

## 2018-09-11 DIAGNOSIS — M5441 Lumbago with sciatica, right side: Secondary | ICD-10-CM | POA: Diagnosis not present

## 2018-09-18 ENCOUNTER — Ambulatory Visit
Admission: RE | Admit: 2018-09-18 | Discharge: 2018-09-18 | Disposition: A | Discharge status: Home / Self Care | Payer: Medicare Other | Source: Ambulatory Visit | Attending: Internal Medicine | Admitting: Internal Medicine

## 2018-09-18 DIAGNOSIS — Z1231 Encounter for screening mammogram for malignant neoplasm of breast: Secondary | ICD-10-CM | POA: Diagnosis not present

## 2018-09-24 DIAGNOSIS — H2513 Age-related nuclear cataract, bilateral: Secondary | ICD-10-CM | POA: Diagnosis not present

## 2018-10-10 DIAGNOSIS — D2262 Melanocytic nevi of left upper limb, including shoulder: Secondary | ICD-10-CM | POA: Diagnosis not present

## 2018-10-10 DIAGNOSIS — Z23 Encounter for immunization: Secondary | ICD-10-CM | POA: Diagnosis not present

## 2018-10-10 DIAGNOSIS — D485 Neoplasm of uncertain behavior of skin: Secondary | ICD-10-CM | POA: Diagnosis not present

## 2018-10-10 DIAGNOSIS — D3617 Benign neoplasm of peripheral nerves and autonomic nervous system of trunk, unspecified: Secondary | ICD-10-CM | POA: Diagnosis not present

## 2018-10-10 DIAGNOSIS — D3612 Benign neoplasm of peripheral nerves and autonomic nervous system, upper limb, including shoulder: Secondary | ICD-10-CM | POA: Diagnosis not present

## 2018-10-10 DIAGNOSIS — L720 Epidermal cyst: Secondary | ICD-10-CM | POA: Diagnosis not present

## 2018-10-10 DIAGNOSIS — D1722 Benign lipomatous neoplasm of skin and subcutaneous tissue of left arm: Secondary | ICD-10-CM | POA: Diagnosis not present

## 2018-10-10 DIAGNOSIS — L821 Other seborrheic keratosis: Secondary | ICD-10-CM | POA: Diagnosis not present

## 2018-10-10 DIAGNOSIS — L570 Actinic keratosis: Secondary | ICD-10-CM | POA: Diagnosis not present

## 2018-10-25 DIAGNOSIS — C541 Malignant neoplasm of endometrium: Secondary | ICD-10-CM | POA: Diagnosis not present

## 2018-12-20 ENCOUNTER — Ambulatory Visit
Admission: RE | Admit: 2018-12-20 | Discharge: 2018-12-20 | Disposition: A | Discharge status: Home / Self Care | Payer: Medicare Other | Source: Ambulatory Visit | Attending: Internal Medicine | Admitting: Internal Medicine

## 2018-12-20 ENCOUNTER — Other Ambulatory Visit: Payer: Self-pay | Admitting: Internal Medicine

## 2018-12-20 DIAGNOSIS — M25551 Pain in right hip: Secondary | ICD-10-CM

## 2018-12-20 DIAGNOSIS — M1611 Unilateral primary osteoarthritis, right hip: Secondary | ICD-10-CM | POA: Diagnosis not present

## 2018-12-20 DIAGNOSIS — Z1389 Encounter for screening for other disorder: Secondary | ICD-10-CM | POA: Diagnosis not present

## 2018-12-20 DIAGNOSIS — I4891 Unspecified atrial fibrillation: Secondary | ICD-10-CM | POA: Diagnosis not present

## 2018-12-20 DIAGNOSIS — R7301 Impaired fasting glucose: Secondary | ICD-10-CM | POA: Diagnosis not present

## 2018-12-20 DIAGNOSIS — R413 Other amnesia: Secondary | ICD-10-CM | POA: Diagnosis not present

## 2018-12-20 DIAGNOSIS — M25559 Pain in unspecified hip: Secondary | ICD-10-CM | POA: Diagnosis not present

## 2018-12-20 DIAGNOSIS — C55 Malignant neoplasm of uterus, part unspecified: Secondary | ICD-10-CM | POA: Diagnosis not present

## 2018-12-20 DIAGNOSIS — Z0000 Encounter for general adult medical examination without abnormal findings: Secondary | ICD-10-CM | POA: Diagnosis not present

## 2018-12-20 DIAGNOSIS — E7800 Pure hypercholesterolemia, unspecified: Secondary | ICD-10-CM | POA: Diagnosis not present

## 2018-12-20 DIAGNOSIS — R269 Unspecified abnormalities of gait and mobility: Secondary | ICD-10-CM | POA: Diagnosis not present

## 2018-12-26 DIAGNOSIS — M25559 Pain in unspecified hip: Secondary | ICD-10-CM | POA: Diagnosis not present

## 2018-12-26 DIAGNOSIS — M545 Low back pain: Secondary | ICD-10-CM | POA: Diagnosis not present

## 2018-12-26 DIAGNOSIS — R2689 Other abnormalities of gait and mobility: Secondary | ICD-10-CM | POA: Diagnosis not present

## 2018-12-26 DIAGNOSIS — R2681 Unsteadiness on feet: Secondary | ICD-10-CM | POA: Diagnosis not present

## 2018-12-31 DIAGNOSIS — M25559 Pain in unspecified hip: Secondary | ICD-10-CM | POA: Diagnosis not present

## 2018-12-31 DIAGNOSIS — M545 Low back pain: Secondary | ICD-10-CM | POA: Diagnosis not present

## 2018-12-31 DIAGNOSIS — R2681 Unsteadiness on feet: Secondary | ICD-10-CM | POA: Diagnosis not present

## 2018-12-31 DIAGNOSIS — R2689 Other abnormalities of gait and mobility: Secondary | ICD-10-CM | POA: Diagnosis not present

## 2019-01-01 ENCOUNTER — Telehealth: Payer: Self-pay | Admitting: Cardiology

## 2019-01-01 NOTE — Telephone Encounter (Signed)
New Message         Patient is confirming appt on Friday 01/04/2019

## 2019-01-01 NOTE — Telephone Encounter (Signed)
Spoke to patient and confirmed her appt date (1/24) and time (11:40am) with Dr Radford Pax.  She was thankful for the call.

## 2019-01-02 DIAGNOSIS — R2689 Other abnormalities of gait and mobility: Secondary | ICD-10-CM | POA: Diagnosis not present

## 2019-01-02 DIAGNOSIS — R2681 Unsteadiness on feet: Secondary | ICD-10-CM | POA: Diagnosis not present

## 2019-01-02 DIAGNOSIS — M545 Low back pain: Secondary | ICD-10-CM | POA: Diagnosis not present

## 2019-01-02 DIAGNOSIS — M25559 Pain in unspecified hip: Secondary | ICD-10-CM | POA: Diagnosis not present

## 2019-01-04 ENCOUNTER — Encounter (INDEPENDENT_AMBULATORY_CARE_PROVIDER_SITE_OTHER): Payer: Self-pay

## 2019-01-04 ENCOUNTER — Ambulatory Visit (INDEPENDENT_AMBULATORY_CARE_PROVIDER_SITE_OTHER): Payer: Medicare Other | Admitting: Cardiology

## 2019-01-04 ENCOUNTER — Encounter: Payer: Self-pay | Admitting: Cardiology

## 2019-01-04 VITALS — BP 142/66 | HR 53 | Ht 66.0 in | Wt 166.4 lb

## 2019-01-04 DIAGNOSIS — R0602 Shortness of breath: Secondary | ICD-10-CM

## 2019-01-04 DIAGNOSIS — I4819 Other persistent atrial fibrillation: Secondary | ICD-10-CM | POA: Diagnosis not present

## 2019-01-04 DIAGNOSIS — I10 Essential (primary) hypertension: Secondary | ICD-10-CM | POA: Diagnosis not present

## 2019-01-04 DIAGNOSIS — I5032 Chronic diastolic (congestive) heart failure: Secondary | ICD-10-CM

## 2019-01-04 DIAGNOSIS — R600 Localized edema: Secondary | ICD-10-CM

## 2019-01-04 NOTE — Patient Instructions (Signed)
Medication Instructions:  Your physician recommends that you continue on your current medications as directed. Please refer to the Current Medication list given to you today.  If you need a refill on your cardiac medications before your next appointment, please call your pharmacy.   Lab work: Today: ProBNP and TSH  If you have labs (blood work) drawn today and your tests are completely normal, you will receive your results only by: Marland Kitchen MyChart Message (if you have MyChart) OR . A paper copy in the mail If you have any lab test that is abnormal or we need to change your treatment, we will call you to review the results.  Testing/Procedures: Your physician has requested that you have an echocardiogram. Echocardiography is a painless test that uses sound waves to create images of your heart. It provides your doctor with information about the size and shape of your heart and how well your heart's chambers and valves are working. This procedure takes approximately one hour. There are no restrictions for this procedure.  Your physician has recommended that you wear an event monitor. Event monitors are medical devices that record the heart's electrical activity. Doctors most often [place] these monitors to diagnose arrhythmias. Arrhythmias are problems with the speed or rhythm of the heartbeat. The monitor is a small, portable device. You can wear one while you do your normal daily activities. This is usually used to diagnose what is causing palpitations/syncope (passing out).  Your physician has requested that you have a lexiscan myoview. For further information please visit [email or web address]. Please follow instruction sheet, as given.  Follow-Up: At [facility], you and your health needs are our priority.  As part of our continuing mission to provide you with exceptional heart care, we have created designated Provider Care Teams.  These Care Teams include your primary Cardiologist (physician) and  Advanced Practice Providers (APPs -  Physician Assistants and Nurse Practitioners) who all work together to provide you with the care you need, when you need it. You will need a follow up appointment in 6 months.  Please call our office 2 months in advance to schedule this appointment.  You may see Fransico Him, MD or one of the following Advanced Practice Providers on your designated Care Team:   [place], PA-C Melina Copa, PA-C . Ermalinda Barrios, PA-C .

## 2019-01-04 NOTE — Progress Notes (Signed)
Cardiology Office Note:    Date:  01/04/2019   ID:  Christina Morgan, DOB [birth year], MRN [id number]  PCP:  Seward Carol, MD  Cardiologist:  Fransico Him, MD    Referring MD: Hart Rochester, MD   Chief Complaint  Patient presents with  . Atrial Fibrillation  . Congestive Heart Failure  . Hypertension    History of Present Illness:    Christina Morgan is a 85 y.o. female with a hx of paroxysmal atrial fibrillation on Eliquis and flecainide, hypertension, hyperlipidemia, and chronic lower extremity edema. She also has a history of chronic diastolic CHF withEcho showing LVEF 55-60% with elevated ventricular filling pressures and atrial filling pressures, trivial pericardial effusion and moderate aortic insufficiency.he h Sas chronic lower extremity edema.  She is here today for followup she is here with her daughter.  She uses a walker to walk.  She has been having a lot of problems recently with episodes where she all of a sudden will become severely fatigued and have to sit down and will get very short of breath.  This usually is when she is up exerting herself.  She denies any chest pain or pressure,  PND, orthopnea,  dizziness, palpitations or syncope.  Her lower extremity edema is controlled with diuretics.  She is compliant with her meds and is tolerating meds with no SE.    Past Medical History:  Diagnosis Date  . Atrial fibrillation ([place])    failed DCCV 05/2014 >> Flecainide started >> DCCV 7/15 sucessful;  f/u ETT neg for pro-arrhythmia >> recurrent AF/AFL >> Flecainide inc to 100 bid with repeat DCCV 9/15  . Cancer [facility])    endometrial ca  . Dyslipidemia   . Fracture of left humerus   . History of cardiovascular stress test    Lexiscan Myoview (06/2014): No ischemia or scar, not gated, low risk  . Hx of echocardiogram    Echo (05/02/14): EF 60% to 65%. No regional wall motion abnormalities. Mild AI, mildly dilated aortic root (37 mm), trivial MR, trivial TR  .  Hyperlipidemia   . Hypertension   . Lipoma of skin    multiple  . Osteopenia     Past Surgical History:  Procedure Laterality Date  . CARDIOVERSION N/A 06/05/2014   Procedure: CARDIOVERSION;  Surgeon: Sueanne Margarita, MD;  Location: [place];  Service: Cardiovascular;  Laterality: N/A;  . CARDIOVERSION N/A 07/11/2014   Procedure: CARDIOVERSION;  Surgeon: Sueanne Margarita, MD;  Location[facility] ENDOSCOPY;  Service: Cardiovascular;  Laterality: N/A;  . CARDIOVERSION N/A 09/04/2014   Procedure: CARDIOVERSION;  Surgeon: Sueanne Margarita, MD;  Location: [facility] ENDOSCOPY;  Service: Cardiovascular;  Laterality: N/A;  . COLONOSCOPY  2008  . COLONOSCOPY N/A 05/20/2015   Procedure: COLONOSCOPY;  Surgeon: Ronald Lobo, MD;  Location: [facility] ENDOSCOPY;  Service: Endoscopy;  Laterality: N/A;  . ROBOTIC ASSISTED SUPRACERVICAL HYSTERECTOMY WITH BILATERAL SALPINGO OOPHERECTOMY  11/06/2012   and bilateral pelvic lymph node dissection    Current Medications: Current Meds  Medication Sig  . amLODipine (NORVASC) 5 MG tablet Take 1.5 tablets (7.5 mg total) by mouth daily.  Marland Kitchen apixaban (ELIQUIS) 5 MG TABS tablet Take 5 mg by mouth 2 (two) times daily.  Marland Kitchen atenolol (TENORMIN) 50 MG tablet TAKE 1 TABLET BY MOUTH TWICE DAILY  . atorvastatin (LIPITOR) 10 MG tablet Take 10 mg by mouth at bedtime.   . flecainide (TAMBOCOR) 100 MG tablet TAKE 1 TABLET BY MOUTH TWICE DAILY  . furosemide (LASIX) 40  MG tablet Take 1 tablet (40 mg total) by mouth 2 (two) times daily.  . potassium chloride SA (K-DUR,KLOR-CON) 20 MEQ tablet TAKE 1 TABLET BY MOUTH DAILY     Allergies:   Penicillins   Social History   Socioeconomic History  . Marital status: Widowed    Spouse name: Not on file  . Number of children: Not on file  . Years of education: Not on file  . Highest education level: Not on file  Occupational History  . Not on file  Social Needs  . Financial resource strain: Not on file  . Food insecurity:    Worry: Not on file     Inability: Not on file  . Transportation needs:    Medical: Not on file    Non-medical: Not on file  Tobacco Use  . Smoking status: Never Smoker  . Smokeless tobacco: Never Used  Substance and Sexual Activity  . Alcohol use: No  . Drug use: No  . Sexual activity: Not on file  Lifestyle  . Physical activity:    Days per week: Not on file    Minutes per session: Not on file  . Stress: Not on file  Relationships  . Social connections:    Talks on phone: Not on file    Gets together: Not on file    Attends religious service: Not on file    Active member of club or organization: Not on file    Attends meetings of clubs or organizations: Not on file    Relationship status: Not on file  Other Topics Concern  . Not on file  Social History Narrative  . Not on file     Family History: The patient's family history includes Cancer (age of onset: 80) in her father; Coronary artery disease in her mother; Heart attack in her mother.  ROS:   Please see the history of present illness.    ROS  All other systems reviewed and negative.   EKGs/Labs/Other Studies Reviewed:    The following studies were reviewed today: none  EKG:  EKG is not ordered today.    Recent Labs: 03/30/2018: ALT 15; B Natriuretic Peptide 249.2; Hemoglobin 13.1; Magnesium 2.2; Platelets 225 04/04/2018: BUN 20; Creatinine, Ser 1.07; Potassium 4.3; Sodium 144   Recent Lipid Panel    Component Value Date/Time   CHOL 175 03/21/2016 0915   TRIG 155 (H) 03/21/2016 0915   HDL 63 03/21/2016 0915   CHOLHDL 2.8 03/21/2016 0915   VLDL 31 (H) 03/21/2016 0915   LDLCALC 81 03/21/2016 0915    Physical Exam:    VS:  BP (!) 142/66   Pulse (!) 53   Ht 5\' 6"  (1.676 m)   Wt 166 lb 6.4 oz (75.5 kg)   SpO2 98%   BMI 26.86 kg/m     Wt Readings from Last 3 Encounters:  01/04/19 166 lb 6.4 oz (75.5 kg)  06/29/18 170 lb 12.8 oz (77.5 kg)  04/04/18 176 lb (79.8 kg)     GEN:  Well nourished, well developed in no acute  distress HEENT: Normal NECK: No JVD; No carotid bruits LYMPHATICS: No lymphadenopathy CARDIAC: RRR, no murmurs, rubs, gallops RESPIRATORY:  Clear to auscultation without rales, wheezing or rhonchi  ABDOMEN: Soft, non-tender, non-distended MUSCULOSKELETAL:  No edema; No deformity  SKIN: Warm and dry NEUROLOGIC:  Alert and oriented x 3 PSYCHIATRIC:  Normal affect   ASSESSMENT:    1. Chronic diastolic heart failure (HCC)   2. Persistent atrial  fibrillation   3. Essential hypertension, benign   4. Edema of extremities    PLAN:    In order of problems listed above:  1.  Chronic diastolic CHF -she appears euvolemic on exam and is actually lost 4 pounds since I saw her last.  We will check a BNP since she is complaining of worsening shortness of breath.  She will continue on Lasix 40 mg twice daily.  Her potassium was stable at 4 recently.  2.  Persistent atrial fibrillation -he has maintain normal sinus rhythm on exam today.  She will continue on atenolol 50 mg twice daily, flecainide 100 mg twice daily and apixaban 5 mg twice daily.  Her creatinine was stable at 1.09 on 12/20/2018 and potassium 4.  Her hemoglobin is stable at 13.9 as well.  3.  Hypertension -BP is well controlled on exam today.  Continue on amlodipine 7.5 mg daily, atenolol 50 mg twice daily.  4.  Chronic lower extremity edema -lower extremity edema is well controlled with diuretic therapy.  5.  SOB - this occurs with exertion and is new.  She has also having some episodes where she will be out walking around and all of a sudden becomes profoundly fatigued and has to sit down associated with shortness of breath.  I am going to get a 2D echocardiogram to make sure LV function is still normal compared to last year and I will also get a Lexiscan Myoview to rule out ischemia.  In addition I am going to get a event monitor to make sure she is not having transient bradycardia causing some of her symptoms.   Medication  Adjustments/Labs and Tests Ordered: Current medicines are reviewed at length with the patient today.  Concerns regarding medicines are outlined above.  No orders of the defined types were placed in this encounter.  No orders of the defined types were placed in this encounter.   Signed, Fransico Him, MD  01/04/2019 12:43 PM    [place]

## 2019-01-05 LAB — PRO B NATRIURETIC PEPTIDE: NT-Pro BNP: 593 pg/mL (ref 0–738)

## 2019-01-05 LAB — TSH: TSH: 2.96 u[IU]/mL (ref 0.450–4.500)

## 2019-01-07 ENCOUNTER — Telehealth (HOSPITAL_COMMUNITY): Payer: Self-pay

## 2019-01-07 DIAGNOSIS — M545 Low back pain: Secondary | ICD-10-CM | POA: Diagnosis not present

## 2019-01-07 DIAGNOSIS — M25559 Pain in unspecified hip: Secondary | ICD-10-CM | POA: Diagnosis not present

## 2019-01-07 DIAGNOSIS — R2681 Unsteadiness on feet: Secondary | ICD-10-CM | POA: Diagnosis not present

## 2019-01-07 DIAGNOSIS — R2689 Other abnormalities of gait and mobility: Secondary | ICD-10-CM | POA: Diagnosis not present

## 2019-01-07 NOTE — Telephone Encounter (Signed)
Detailed message left on the answering machine. She was asked to call back with any questions. Christina Morgan EMTP

## 2019-01-09 DIAGNOSIS — R2681 Unsteadiness on feet: Secondary | ICD-10-CM | POA: Diagnosis not present

## 2019-01-09 DIAGNOSIS — R2689 Other abnormalities of gait and mobility: Secondary | ICD-10-CM | POA: Diagnosis not present

## 2019-01-09 DIAGNOSIS — M25559 Pain in unspecified hip: Secondary | ICD-10-CM | POA: Diagnosis not present

## 2019-01-09 DIAGNOSIS — M545 Low back pain: Secondary | ICD-10-CM | POA: Diagnosis not present

## 2019-01-10 ENCOUNTER — Ambulatory Visit (HOSPITAL_COMMUNITY): Payer: Medicare Other | Attending: Cardiovascular Disease

## 2019-01-10 DIAGNOSIS — R0602 Shortness of breath: Secondary | ICD-10-CM | POA: Diagnosis not present

## 2019-01-10 LAB — MYOCARDIAL PERFUSION IMAGING
LV dias vol: 74 mL (ref 46–106)
LV sys vol: 28 mL
Peak HR: 71 {beats}/min
Rest HR: 54 {beats}/min
SDS: 3
SRS: 0
SSS: 3
TID: 1.13

## 2019-01-10 MED ORDER — TECHNETIUM TC 99M TETROFOSMIN IV KIT
10.8000 | PACK | Freq: Once | INTRAVENOUS | Status: AC | PRN
  Administered 2019-01-10: 10.8 via INTRAVENOUS
  Filled 2019-01-10: qty 11

## 2019-01-10 MED ORDER — REGADENOSON 0.4 MG/5ML IV SOLN
0.4000 mg | Freq: Once | INTRAVENOUS | Status: AC
  Administered 2019-01-10: 0.4 mg via INTRAVENOUS

## 2019-01-10 MED ORDER — TECHNETIUM TC 99M TETROFOSMIN IV KIT
32.7000 | PACK | Freq: Once | INTRAVENOUS | Status: AC | PRN
  Administered 2019-01-10: 32.7 via INTRAVENOUS
  Filled 2019-01-10: qty 33

## 2019-01-14 DIAGNOSIS — R2681 Unsteadiness on feet: Secondary | ICD-10-CM | POA: Diagnosis not present

## 2019-01-14 DIAGNOSIS — M25559 Pain in unspecified hip: Secondary | ICD-10-CM | POA: Diagnosis not present

## 2019-01-14 DIAGNOSIS — R2689 Other abnormalities of gait and mobility: Secondary | ICD-10-CM | POA: Diagnosis not present

## 2019-01-14 DIAGNOSIS — M545 Low back pain: Secondary | ICD-10-CM | POA: Diagnosis not present

## 2019-01-16 ENCOUNTER — Ambulatory Visit (INDEPENDENT_AMBULATORY_CARE_PROVIDER_SITE_OTHER): Payer: Medicare Other

## 2019-01-16 ENCOUNTER — Ambulatory Visit (HOSPITAL_COMMUNITY): Payer: Medicare Other | Attending: Cardiovascular Disease

## 2019-01-16 DIAGNOSIS — I4819 Other persistent atrial fibrillation: Secondary | ICD-10-CM | POA: Diagnosis not present

## 2019-01-16 DIAGNOSIS — R0602 Shortness of breath: Secondary | ICD-10-CM | POA: Insufficient documentation

## 2019-01-21 ENCOUNTER — Telehealth: Payer: Self-pay

## 2019-01-21 DIAGNOSIS — M545 Low back pain: Secondary | ICD-10-CM | POA: Diagnosis not present

## 2019-01-21 DIAGNOSIS — I712 Thoracic aortic aneurysm, without rupture: Secondary | ICD-10-CM

## 2019-01-21 DIAGNOSIS — M25559 Pain in unspecified hip: Secondary | ICD-10-CM | POA: Diagnosis not present

## 2019-01-21 DIAGNOSIS — I7121 Aneurysm of the ascending aorta, without rupture: Secondary | ICD-10-CM

## 2019-01-21 DIAGNOSIS — R2689 Other abnormalities of gait and mobility: Secondary | ICD-10-CM | POA: Diagnosis not present

## 2019-01-21 DIAGNOSIS — R2681 Unsteadiness on feet: Secondary | ICD-10-CM | POA: Diagnosis not present

## 2019-01-21 NOTE — Telephone Encounter (Signed)
The patient has been notified of the result and verbalized understanding.  All questions (if any) were answered. Sarina Ill, RN 01/21/2019 4:57 PM

## 2019-01-21 NOTE — Telephone Encounter (Signed)
-----   Message from Sueanne Margarita, MD sent at 01/16/2019  6:01 PM EST ----- Please let patient know that echo showed normal LVF with EF 60-65% , Mildly thickened AV (normal for age), mildly leaky PV and mildly dilated ascending aorta - please repeat study in 1 year

## 2019-01-25 DIAGNOSIS — R2681 Unsteadiness on feet: Secondary | ICD-10-CM | POA: Diagnosis not present

## 2019-01-25 DIAGNOSIS — M545 Low back pain: Secondary | ICD-10-CM | POA: Diagnosis not present

## 2019-01-25 DIAGNOSIS — M25559 Pain in unspecified hip: Secondary | ICD-10-CM | POA: Diagnosis not present

## 2019-01-25 DIAGNOSIS — R2689 Other abnormalities of gait and mobility: Secondary | ICD-10-CM | POA: Diagnosis not present

## 2019-01-28 DIAGNOSIS — I4891 Unspecified atrial fibrillation: Secondary | ICD-10-CM | POA: Diagnosis not present

## 2019-01-28 DIAGNOSIS — R319 Hematuria, unspecified: Secondary | ICD-10-CM | POA: Diagnosis not present

## 2019-01-28 DIAGNOSIS — C52 Malignant neoplasm of vagina: Secondary | ICD-10-CM | POA: Diagnosis not present

## 2019-01-28 DIAGNOSIS — N898 Other specified noninflammatory disorders of vagina: Secondary | ICD-10-CM | POA: Diagnosis not present

## 2019-01-28 DIAGNOSIS — C541 Malignant neoplasm of endometrium: Secondary | ICD-10-CM | POA: Diagnosis not present

## 2019-01-30 DIAGNOSIS — R2681 Unsteadiness on feet: Secondary | ICD-10-CM | POA: Diagnosis not present

## 2019-01-30 DIAGNOSIS — M545 Low back pain: Secondary | ICD-10-CM | POA: Diagnosis not present

## 2019-01-30 DIAGNOSIS — M25559 Pain in unspecified hip: Secondary | ICD-10-CM | POA: Diagnosis not present

## 2019-01-30 DIAGNOSIS — R2689 Other abnormalities of gait and mobility: Secondary | ICD-10-CM | POA: Diagnosis not present

## 2019-02-01 DIAGNOSIS — M545 Low back pain: Secondary | ICD-10-CM | POA: Diagnosis not present

## 2019-02-01 DIAGNOSIS — R2681 Unsteadiness on feet: Secondary | ICD-10-CM | POA: Diagnosis not present

## 2019-02-01 DIAGNOSIS — R2689 Other abnormalities of gait and mobility: Secondary | ICD-10-CM | POA: Diagnosis not present

## 2019-02-01 DIAGNOSIS — M25559 Pain in unspecified hip: Secondary | ICD-10-CM | POA: Diagnosis not present

## 2019-02-06 DIAGNOSIS — C541 Malignant neoplasm of endometrium: Secondary | ICD-10-CM | POA: Diagnosis not present

## 2019-02-06 DIAGNOSIS — R319 Hematuria, unspecified: Secondary | ICD-10-CM | POA: Diagnosis not present

## 2019-02-06 DIAGNOSIS — N939 Abnormal uterine and vaginal bleeding, unspecified: Secondary | ICD-10-CM | POA: Diagnosis not present

## 2019-02-07 DIAGNOSIS — I4891 Unspecified atrial fibrillation: Secondary | ICD-10-CM | POA: Diagnosis not present

## 2019-02-07 DIAGNOSIS — C541 Malignant neoplasm of endometrium: Secondary | ICD-10-CM | POA: Diagnosis not present

## 2019-02-07 DIAGNOSIS — N898 Other specified noninflammatory disorders of vagina: Secondary | ICD-10-CM | POA: Diagnosis not present

## 2019-02-07 DIAGNOSIS — R319 Hematuria, unspecified: Secondary | ICD-10-CM | POA: Diagnosis not present

## 2019-02-15 DIAGNOSIS — I4819 Other persistent atrial fibrillation: Secondary | ICD-10-CM | POA: Diagnosis not present

## 2019-02-19 DIAGNOSIS — E041 Nontoxic single thyroid nodule: Secondary | ICD-10-CM | POA: Diagnosis not present

## 2019-03-12 DIAGNOSIS — M5441 Lumbago with sciatica, right side: Secondary | ICD-10-CM | POA: Diagnosis not present

## 2019-03-12 DIAGNOSIS — M4854XD Collapsed vertebra, not elsewhere classified, thoracic region, subsequent encounter for fracture with routine healing: Secondary | ICD-10-CM | POA: Diagnosis not present

## 2019-03-25 DIAGNOSIS — S22079A Unspecified fracture of T9-T10 vertebra, initial encounter for closed fracture: Secondary | ICD-10-CM | POA: Diagnosis not present

## 2019-03-25 DIAGNOSIS — M438X6 Other specified deforming dorsopathies, lumbar region: Secondary | ICD-10-CM | POA: Diagnosis not present

## 2019-03-25 DIAGNOSIS — Z8542 Personal history of malignant neoplasm of other parts of uterus: Secondary | ICD-10-CM | POA: Diagnosis not present

## 2019-03-25 DIAGNOSIS — C541 Malignant neoplasm of endometrium: Secondary | ICD-10-CM | POA: Diagnosis not present

## 2019-03-25 DIAGNOSIS — M47817 Spondylosis without myelopathy or radiculopathy, lumbosacral region: Secondary | ICD-10-CM | POA: Diagnosis not present

## 2019-03-25 DIAGNOSIS — S22080A Wedge compression fracture of T11-T12 vertebra, initial encounter for closed fracture: Secondary | ICD-10-CM | POA: Diagnosis not present

## 2019-03-26 DIAGNOSIS — M546 Pain in thoracic spine: Secondary | ICD-10-CM | POA: Diagnosis not present

## 2019-03-26 DIAGNOSIS — K5900 Constipation, unspecified: Secondary | ICD-10-CM | POA: Diagnosis not present

## 2019-03-26 DIAGNOSIS — S22080A Wedge compression fracture of T11-T12 vertebra, initial encounter for closed fracture: Secondary | ICD-10-CM | POA: Diagnosis not present

## 2019-03-28 DIAGNOSIS — S22080A Wedge compression fracture of T11-T12 vertebra, initial encounter for closed fracture: Secondary | ICD-10-CM | POA: Diagnosis not present

## 2019-04-02 ENCOUNTER — Other Ambulatory Visit: Payer: Self-pay | Admitting: Neurosurgery

## 2019-04-02 DIAGNOSIS — S22080A Wedge compression fracture of T11-T12 vertebra, initial encounter for closed fracture: Secondary | ICD-10-CM

## 2019-04-03 ENCOUNTER — Other Ambulatory Visit (HOSPITAL_COMMUNITY): Payer: Self-pay | Admitting: Interventional Radiology

## 2019-04-03 ENCOUNTER — Telehealth (HOSPITAL_COMMUNITY): Payer: Self-pay

## 2019-04-03 NOTE — Telephone Encounter (Signed)
Called pt's daughter to see if you could bring copy of mri for kyphoplasty, no answer, left vm. AW

## 2019-04-08 ENCOUNTER — Ambulatory Visit
Admission: RE | Admit: 2019-04-08 | Discharge: 2019-04-08 | Disposition: A | Discharge status: Home / Self Care | Payer: Self-pay | Source: Ambulatory Visit | Attending: Neurosurgery | Admitting: Neurosurgery

## 2019-04-08 ENCOUNTER — Other Ambulatory Visit (HOSPITAL_COMMUNITY): Payer: Self-pay | Admitting: Neurosurgery

## 2019-04-08 DIAGNOSIS — R52 Pain, unspecified: Secondary | ICD-10-CM

## 2019-04-09 ENCOUNTER — Other Ambulatory Visit (HOSPITAL_COMMUNITY): Payer: Self-pay | Admitting: Neurosurgery

## 2019-04-09 DIAGNOSIS — S22080A Wedge compression fracture of T11-T12 vertebra, initial encounter for closed fracture: Secondary | ICD-10-CM

## 2019-04-10 ENCOUNTER — Other Ambulatory Visit: Payer: Self-pay | Admitting: Radiology

## 2019-04-12 ENCOUNTER — Encounter (HOSPITAL_COMMUNITY): Payer: Self-pay

## 2019-04-12 ENCOUNTER — Ambulatory Visit (HOSPITAL_COMMUNITY)
Admission: RE | Admit: 2019-04-12 | Discharge: 2019-04-12 | Disposition: A | Discharge status: Home / Self Care | Payer: Medicare Other | Source: Ambulatory Visit | Attending: Neurosurgery | Admitting: Neurosurgery

## 2019-04-12 ENCOUNTER — Other Ambulatory Visit (HOSPITAL_COMMUNITY): Payer: Self-pay | Admitting: Neurosurgery

## 2019-04-12 ENCOUNTER — Other Ambulatory Visit: Payer: Self-pay

## 2019-04-12 DIAGNOSIS — X58XXXA Exposure to other specified factors, initial encounter: Secondary | ICD-10-CM | POA: Insufficient documentation

## 2019-04-12 DIAGNOSIS — Z8249 Family history of ischemic heart disease and other diseases of the circulatory system: Secondary | ICD-10-CM | POA: Insufficient documentation

## 2019-04-12 DIAGNOSIS — S22080A Wedge compression fracture of T11-T12 vertebra, initial encounter for closed fracture: Secondary | ICD-10-CM | POA: Diagnosis not present

## 2019-04-12 DIAGNOSIS — M8580 Other specified disorders of bone density and structure, unspecified site: Secondary | ICD-10-CM | POA: Insufficient documentation

## 2019-04-12 DIAGNOSIS — I4891 Unspecified atrial fibrillation: Secondary | ICD-10-CM | POA: Insufficient documentation

## 2019-04-12 DIAGNOSIS — Z7901 Long term (current) use of anticoagulants: Secondary | ICD-10-CM | POA: Diagnosis not present

## 2019-04-12 DIAGNOSIS — Z90722 Acquired absence of ovaries, bilateral: Secondary | ICD-10-CM | POA: Diagnosis not present

## 2019-04-12 DIAGNOSIS — Z880 Allergy status to penicillin: Secondary | ICD-10-CM | POA: Insufficient documentation

## 2019-04-12 DIAGNOSIS — Z79899 Other long term (current) drug therapy: Secondary | ICD-10-CM | POA: Insufficient documentation

## 2019-04-12 DIAGNOSIS — E785 Hyperlipidemia, unspecified: Secondary | ICD-10-CM | POA: Insufficient documentation

## 2019-04-12 DIAGNOSIS — Z90710 Acquired absence of both cervix and uterus: Secondary | ICD-10-CM | POA: Diagnosis not present

## 2019-04-12 DIAGNOSIS — I10 Essential (primary) hypertension: Secondary | ICD-10-CM | POA: Insufficient documentation

## 2019-04-12 HISTORY — PX: IR KYPHO EA ADDL LEVEL THORACIC OR LUMBAR: IMG5520

## 2019-04-12 HISTORY — PX: IR KYPHO THORACIC WITH BONE BIOPSY: IMG5518

## 2019-04-12 LAB — CBC WITH DIFFERENTIAL/PLATELET
Abs Immature Granulocytes: 0.04 10*3/uL (ref 0.00–0.07)
Basophils Absolute: 0 10*3/uL (ref 0.0–0.1)
Basophils Relative: 0 %
Eosinophils Absolute: 0.1 10*3/uL (ref 0.0–0.5)
Eosinophils Relative: 1 %
HCT: 37.2 % (ref 36.0–46.0)
Hemoglobin: 12 g/dL (ref 12.0–15.0)
Immature Granulocytes: 1 %
Lymphocytes Relative: 13 %
Lymphs Abs: 1 10*3/uL (ref 0.7–4.0)
MCH: 31.8 pg (ref 26.0–34.0)
MCHC: 32.3 g/dL (ref 30.0–36.0)
MCV: 98.7 fL (ref 80.0–100.0)
Monocytes Absolute: 0.6 10*3/uL (ref 0.1–1.0)
Monocytes Relative: 8 %
Neutro Abs: 5.9 10*3/uL (ref 1.7–7.7)
Neutrophils Relative %: 77 %
Platelets: 263 10*3/uL (ref 150–400)
RBC: 3.77 MIL/uL — ABNORMAL LOW (ref 3.87–5.11)
RDW: 14.3 % (ref 11.5–15.5)
WBC: 7.7 10*3/uL (ref 4.0–10.5)
nRBC: 0 % (ref 0.0–0.2)

## 2019-04-12 LAB — BASIC METABOLIC PANEL
Anion gap: 8 (ref 5–15)
BUN: 20 mg/dL (ref 8–23)
CO2: 24 mmol/L (ref 22–32)
Calcium: 8.8 mg/dL — ABNORMAL LOW (ref 8.9–10.3)
Chloride: 108 mmol/L (ref 98–111)
Creatinine, Ser: 1.33 mg/dL — ABNORMAL HIGH (ref 0.44–1.00)
GFR calc Af Amer: 42 mL/min — ABNORMAL LOW (ref >60)
GFR calc non Af Amer: 37 mL/min — ABNORMAL LOW (ref >60)
Glucose, Bld: 109 mg/dL — ABNORMAL HIGH (ref 70–99)
Potassium: 3.7 mmol/L (ref 3.5–5.1)
Sodium: 140 mmol/L (ref 135–145)

## 2019-04-12 LAB — PROTIME-INR
INR: 1 (ref 0.8–1.2)
Prothrombin Time: 13.1 seconds (ref 11.4–15.2)

## 2019-04-12 MED ORDER — VANCOMYCIN HCL IN DEXTROSE 1-5 GM/200ML-% IV SOLN
INTRAVENOUS | Status: AC
  Administered 2019-04-12: 10:00:00 1000 mg via INTRAVENOUS
  Filled 2019-04-12: qty 200

## 2019-04-12 MED ORDER — LIDOCAINE HCL (PF) 1 % IJ SOLN
INTRAMUSCULAR | Status: AC | PRN
  Administered 2019-04-12: 20 mL

## 2019-04-12 MED ORDER — MIDAZOLAM HCL 2 MG/2ML IJ SOLN
INTRAMUSCULAR | Status: AC
  Filled 2019-04-12: qty 4

## 2019-04-12 MED ORDER — FENTANYL CITRATE (PF) 100 MCG/2ML IJ SOLN
INTRAMUSCULAR | Status: AC
  Filled 2019-04-12: qty 4

## 2019-04-12 MED ORDER — OXYCODONE-ACETAMINOPHEN 5-325 MG PO TABS: 1.0000 | ORAL_TABLET | ORAL | Status: DC | PRN

## 2019-04-12 MED ORDER — IOHEXOL 300 MG/ML  SOLN
50.0000 mL | Freq: Once | INTRAMUSCULAR | Status: AC | PRN
  Administered 2019-04-12: 15 mL

## 2019-04-12 MED ORDER — SODIUM CHLORIDE 0.9 % IV SOLN
INTRAVENOUS | Status: DC
  Administered 2019-04-12: 09:00:00 via INTRAVENOUS

## 2019-04-12 MED ORDER — MIDAZOLAM HCL 2 MG/2ML IJ SOLN
INTRAMUSCULAR | Status: AC | PRN
  Administered 2019-04-12 (×4): 1 mg via INTRAVENOUS

## 2019-04-12 MED ORDER — FENTANYL CITRATE (PF) 100 MCG/2ML IJ SOLN
INTRAMUSCULAR | Status: AC | PRN
  Administered 2019-04-12: 50 ug via INTRAVENOUS

## 2019-04-12 MED ORDER — LIDOCAINE HCL (PF) 1 % IJ SOLN
INTRAMUSCULAR | Status: AC
  Filled 2019-04-12: qty 30

## 2019-04-12 MED ORDER — VANCOMYCIN HCL IN DEXTROSE 1-5 GM/200ML-% IV SOLN
1000.0000 mg | INTRAVENOUS | Status: AC
  Administered 2019-04-12: 10:00:00 1000 mg via INTRAVENOUS

## 2019-04-12 NOTE — Procedures (Signed)
Interventional Radiology Procedure Note  Procedure: Technically successful T11 and T12 kyphoplsaty.   Complications: None  Estimated Blood Loss: None  Recommendations: - Bedrest x 3 hrs - DC home  Signed,  Criselda Peaches, MD

## 2019-04-12 NOTE — Discharge Instructions (Signed)
Balloon Kyphoplasty, Care After  Refer to this sheet in the next few weeks. These instructions provide you with information about caring for yourself after your procedure. Your health care provider may also give you more specific instructions. Your treatment has been planned according to current medical practices, but problems sometimes occur. Call your health care provider if you have any problems or questions after your procedure.  What can I expect after the procedure?  After your procedure, it is common to have back pain.  Follow these instructions at home:  Incision care  Follow instructions from your health care provider about how to take care of your incisions. Make sure you:  Wash your hands with soap and water before you change your bandage (dressing). If soap and water are not available, use hand sanitizer.  Change your dressing as told by your health care provider.  Leave stitches (sutures), skin glue, or adhesive strips in place. These skin closures may need to be in place for 2 weeks or longer. If adhesive strip edges start to loosen and curl up, you may trim the loose edges. Do not remove adhesive strips completely unless your health care provider tells you to do that.  Check your incision area every day for signs of infection. Watch for:  Redness, swelling, or pain.  Fluid, blood, or pus.  Keep your dressing dry until your health care provider says that it can be removed.  Activity    Rest your back and avoid intense physical activity for as long as told by your health care provider.  Return to your normal activities as told by your health care provider. Ask your health care provider what activities are safe for you.  Do not lift anything that is heavier than 10 lb (4.5 kg). This is about the weight of a gallon of milk. You may need to avoid heavy lifting for several weeks.  General instructions  Take over-the-counter and prescription medicines only as told by your health care provider.  If directed,  apply ice to the painful area:  Put ice in a plastic bag.  Place a towel between your skin and the bag.  Leave the ice on for 20 minutes, 2-3 times per day.  Do not use tobacco products, including cigarettes, chewing tobacco, or e-cigarettes. If you need help quitting, ask your health care provider.  Keep all follow-up visits as told by your health care provider. This is important.  Contact a health care provider if:  You have a fever.  You have redness, swelling, or pain at the site of your incisions.  You have fluid, blood, or pus coming from your incisions.  You have pain that gets worse or does not get better with medicine.  You develop numbness or weakness in any part of your body.  Get help right away if:  You have chest pain.  You have difficulty breathing.  You cannot move your legs.  You cannot control your bladder or bowel movements.  You suddenly become weak or numb on one side of your body.  You become very confused.  You have trouble speaking or understanding, or both.  This information is not intended to replace advice given to you by your health care provider. Make sure you discuss any questions you have with your health care provider.  Document Released: 08/19/2015 Document Revised: 05/05/2016 Document Reviewed: 03/23/2015  Elsevier Interactive Patient Education  2019 [employer]      Moderate Conscious Sedation, Adult, Care After  These   instructions provide you with information about caring for yourself after your procedure. Your health care provider may also give you more specific instructions. Your treatment has been planned according to current medical practices, but problems sometimes occur. Call your health care provider if you have any problems or questions after your procedure.  What can I expect after the procedure?  After your procedure, it is common:  To feel sleepy for several hours.  To feel clumsy and have poor balance for several hours.  To have poor judgment for several hours.  To  vomit if you eat too soon.  Follow these instructions at home:  For at least 24 hours after the procedure:    Do not:  Participate in activities where you could fall or become injured.  Drive.  Use heavy machinery.  Drink alcohol.  Take sleeping pills or medicines that cause drowsiness.  Make important decisions or sign legal documents.  Take care of children on your own.  Rest.  Eating and drinking  Follow the diet recommended by your health care provider.  If you vomit:  Drink water, juice, or soup when you can drink without vomiting.  Make sure you have little or no nausea before eating solid foods.  General instructions  Have a responsible adult stay with you until you are awake and alert.  Take over-the-counter and prescription medicines only as told by your health care provider.  If you smoke, do not smoke without supervision.  Keep all follow-up visits as told by your health care provider. This is important.  Contact a health care provider if:  You keep feeling nauseous or you keep vomiting.  You feel light-headed.  You develop a rash.  You have a fever.  Get help right away if:  You have trouble breathing.  This information is not intended to replace advice given to you by your health care provider. Make sure you discuss any questions you have with your health care provider.  Document Released: 09/18/2013 Document Revised: 05/02/2016 Document Reviewed: 03/19/2016  Elsevier Interactive Patient Education  2019 [employer]

## 2019-04-12 NOTE — H&P (Signed)
Chief Complaint: Patient was seen in consultation today for compression fractures of T11 and T12  Referring Physician(s): Cram,Gary  Supervising Physician: Jacqulynn Cadet  Patient Status: [facility] - In-pt  History of Present Illness: Christina Morgan is a 85 y.o. female with past medical history of a fib on Eliquis, HTN, HLD who has experienced worsening back pain for the past 1 year.  Patient states she has had generalized soreness starting approximately 1 year ago which has become more acute in the past few weeks. She did not fall.  No known trauma or mechanism of injury.  She was evaluated by Dr. Saintclair Halsted who sent her for MR thoracic and lumbar spine which revealed acute compression fractures of T11 and T12.  She was then referred to radiology for vertebroplasty/kyphoplasty.   She presents to radiology today in her usual state of health.  She denies fever, chills, nausea, abdominal pain, dysuria. She reports back pain in her lower spine. She is using a walker at home for support.  She currently lives with her daughter.   Past Medical History:  Diagnosis Date  . Atrial fibrillation ([place])    failed DCCV 05/2014 >> Flecainide started >> DCCV 7/15 sucessful;  f/u ETT neg for pro-arrhythmia >> recurrent AF/AFL >> Flecainide inc to 100 bid with repeat DCCV 9/15  . Cancer [facility])    endometrial ca  . Dyslipidemia   . Fracture of left humerus   . History of cardiovascular stress test    Lexiscan Myoview (06/2014): No ischemia or scar, not gated, low risk  . Hx of echocardiogram    Echo (05/02/14): EF 60% to 65%. No regional wall motion abnormalities. Mild AI, mildly dilated aortic root (37 mm), trivial MR, trivial TR  . Hyperlipidemia   . Hypertension   . Lipoma of skin    multiple  . Osteopenia     Past Surgical History:  Procedure Laterality Date  . CARDIOVERSION N/A 06/05/2014   Procedure: CARDIOVERSION;  Surgeon: Sueanne Margarita, MD;  Location: [facility];  Service: Cardiovascular;   Laterality: N/A;  . CARDIOVERSION N/A 07/11/2014   Procedure: CARDIOVERSION;  Surgeon: Sueanne Margarita, MD;  Location: [facility] ENDOSCOPY;  Service: Cardiovascular;  Laterality: N/A;  . CARDIOVERSION N/A 09/04/2014   Procedure: CARDIOVERSION;  Surgeon: Sueanne Margarita, MD;  Location: [facility] ENDOSCOPY;  Service: Cardiovascular;  Laterality: N/A;  . COLONOSCOPY  2008  . COLONOSCOPY N/A 05/20/2015   Procedure: COLONOSCOPY;  Surgeon: Ronald Lobo, MD;  Location: [facility] ENDOSCOPY;  Service: Endoscopy;  Laterality: N/A;  . ROBOTIC ASSISTED SUPRACERVICAL HYSTERECTOMY WITH BILATERAL SALPINGO OOPHERECTOMY  11/06/2012   and bilateral pelvic lymph node dissection    Allergies: Penicillins  Medications: Prior to Admission medications   Medication Sig Start Date End Date Taking? Authorizing Provider  amLODipine (NORVASC) 5 MG tablet Take 1.5 tablets (7.5 mg total) by mouth daily. 07/24/18  Yes Turner, Traci R, MD  atenolol (TENORMIN) 50 MG tablet TAKE 1 TABLET BY MOUTH TWICE DAILY 04/06/18  Yes Turner, Traci R, MD  atorvastatin (LIPITOR) 10 MG tablet Take 10 mg by mouth at bedtime.  10/04/12  Yes [provider]  flecainide (TAMBOCOR) 100 MG tablet TAKE 1 TABLET BY MOUTH TWICE DAILY 08/27/18  Yes Turner, Eber Hong, MD  furosemide (LASIX) 40 MG tablet Take 1 tablet (40 mg total) by mouth 2 (two) times daily. 08/08/18  Yes Turner, Eber Hong, MD  HYDROcodone-acetaminophen (NORCO) 7.5-325 MG tablet Take 1 tablet by mouth every 6 (six) hours as needed  for moderate pain.   Yes [provider]  megestrol (MEGACE) 40 MG tablet Take 40 mg by mouth 2 (two) times daily. Take for 3 weeks , then alternate with tamoxifen 20 mg 1 daily for 3 weeks, etc. - starting medication on 04/14/2019   Yes [provider]  potassium chloride SA (K-DUR,KLOR-CON) 20 MEQ tablet TAKE 1 TABLET BY MOUTH DAILY 09/07/18  Yes Turner, Eber Hong, MD  apixaban (ELIQUIS) 5 MG TABS tablet Take 5 mg by mouth 2 (two) times daily.    [provider]     Family History  Problem Relation Age of Onset  . Cancer Father 50       metastatic oropharyngeal ca  . Heart attack Mother   . Coronary artery disease Mother     Social History   Socioeconomic History  . Marital status: Widowed    Spouse name: Not on file  . Number of children: Not on file  . Years of education: Not on file  . Highest education level: Not on file  Occupational History  . Not on file  Social Needs  . Financial resource strain: Not on file  . Food insecurity:    Worry: Not on file    Inability: Not on file  . Transportation needs:    Medical: Not on file    Non-medical: Not on file  Tobacco Use  . Smoking status: Never Smoker  . Smokeless tobacco: Never Used  Substance and Sexual Activity  . Alcohol use: No  . Drug use: No  . Sexual activity: Not on file  Lifestyle  . Physical activity:    Days per week: Not on file    Minutes per session: Not on file  . Stress: Not on file  Relationships  . Social connections:    Talks on phone: Not on file    Gets together: Not on file    Attends religious service: Not on file    Active member of club or organization: Not on file    Attends meetings of clubs or organizations: Not on file    Relationship status: Not on file  Other Topics Concern  . Not on file  Social History Narrative  . Not on file     Review of Systems: A 12 point ROS discussed and pertinent positives are indicated in the HPI above.  All other systems are negative.  Review of Systems  Constitutional: Negative for fatigue and fever.  Respiratory: Negative for cough and shortness of breath.   Cardiovascular: Negative for chest pain.  Gastrointestinal: Negative for abdominal pain.  Musculoskeletal: Positive for back pain.  Psychiatric/Behavioral: Negative for behavioral problems and confusion.    Vital Signs: BP (!) 151/68   Pulse 66   Temp 99.1 F (37.3 C) (Oral)   Resp 16   Ht 5\' 6"  (1.676 m)   Wt 166 lb  (75.3 kg)   SpO2 100%   BMI 26.79 kg/m   Physical Exam Vitals signs and nursing note reviewed.  Constitutional:      Appearance: Normal appearance.  HENT:     Mouth/Throat:     Mouth: Mucous membranes are moist.     Pharynx: Oropharynx is clear.  Cardiovascular:     Rate and Rhythm: Normal rate and regular rhythm.  Pulmonary:     Effort: Pulmonary effort is normal.     Breath sounds: Normal breath sounds.  Musculoskeletal: Normal range of motion.        General: Tenderness (  point tenderness in lower back) present.  Skin:    General: Skin is warm and dry.  Neurological:     General: No focal deficit present.     Mental Status: She is alert and oriented to person, place, and time.  Psychiatric:        Mood and Affect: Mood normal.        Behavior: Behavior normal.        Thought Content: Thought content normal.        Judgment: Judgment normal.      MD Evaluation Airway: WNL Heart: WNL Abdomen: WNL Chest/ Lungs: WNL ASA  Classification: 3 Mallampati/Airway Score: One   Imaging: Mr Outside Films Spine  Result Date: 04/08/2019 This examination belongs to an outside facility and is stored here for comparison purposes only.  Contact the originating outside institution for any associated report or interpretation.   Labs:  CBC: Recent Labs    04/12/19 0829  WBC 7.7  HGB 12.0  HCT 37.2  PLT 263    COAGS: Recent Labs    04/12/19 0829  INR 1.0    BMP: Recent Labs    04/12/19 0829  NA 140  K 3.7  CL 108  CO2 24  GLUCOSE 109*  BUN 20  CALCIUM 8.8*  CREATININE 1.33*  GFRNONAA 37*  GFRAA 42*    LIVER FUNCTION TESTS: No results for input(s): BILITOT, AST, ALT, ALKPHOS, PROT, ALBUMIN in the last 8760 hours.  TUMOR MARKERS: No results for input(s): AFPTM, CEA, CA199, CHROMGRNA in the last 8760 hours.  Assessment and Plan: T11 and T12 compression fracutres Patient with past medical history of a fib on Eliquis presents with complaint of acute  back pain. Imaging shows acute compression fractures of T11 and T12.  IR consulted for vertebroplasty/kyphoplasty at the request of Dr. Saintclair Halsted. Case reviewed by Dr. Laurence Ferrari who approves patient for procedure.  Patient presents today in their usual state of health.  She has been NPO and is not currently on blood thinners.   Risks and benefits of kyphoplasty were discussed with the patient including, but not limited to education regarding the natural healing process of compression fractures without intervention, bleeding, infection, cement migration which may cause spinal cord damage, paralysis, pulmonary embolism or even death.  This interventional procedure involves the use of X-rays and because of the nature of the planned procedure, it is possible that we will have prolonged use of X-ray fluoroscopy.  Potential radiation risks to you include (but are not limited to) the following: - A slightly elevated risk for cancer  several years later in life. This risk is typically less than 0.5% percent. This risk is low in comparison to the normal incidence of human cancer, which is 33% for women and 50% for men according to the [place]. - Radiation induced injury can include skin redness, resembling a rash, tissue breakdown / ulcers and hair loss (which can be temporary or permanent).   The likelihood of either of these occurring depends on the difficulty of the procedure and whether you are sensitive to radiation due to previous procedures, disease, or genetic conditions.   IF your procedure requires a prolonged use of radiation, you will be notified and given written instructions for further action.  It is your responsibility to monitor the irradiated area for the 2 weeks following the procedure and to notify your physician if you are concerned that you have suffered a radiation induced injury.    All of the  patient's questions were answered, patient is agreeable to proceed.   Consent signed and in chart.  Thank you for this interesting consult.  I greatly enjoyed meeting EMOJEAN GERTZ and look forward to participating in their care.  A copy of this report was sent to the requesting provider on this date.  Electronically Signed: Docia Barrier, PA 04/12/2019, 9:05 AM   I spent a total of  30 Minutes   in face to face in clinical consultation, greater than 50% of which was counseling/coordinating care for T11 and T12 compression fractures.

## 2019-04-23 DIAGNOSIS — S22080A Wedge compression fracture of T11-T12 vertebra, initial encounter for closed fracture: Secondary | ICD-10-CM | POA: Diagnosis not present

## 2019-04-24 ENCOUNTER — Other Ambulatory Visit: Payer: Self-pay | Admitting: Neurosurgery

## 2019-04-24 ENCOUNTER — Ambulatory Visit
Admission: RE | Admit: 2019-04-24 | Discharge: 2019-04-24 | Disposition: A | Discharge status: Home / Self Care | Payer: Medicare Other | Source: Ambulatory Visit | Attending: Neurosurgery | Admitting: Neurosurgery

## 2019-04-24 ENCOUNTER — Other Ambulatory Visit: Payer: Self-pay

## 2019-04-24 DIAGNOSIS — S22080A Wedge compression fracture of T11-T12 vertebra, initial encounter for closed fracture: Secondary | ICD-10-CM | POA: Diagnosis not present

## 2019-04-25 DIAGNOSIS — S22080A Wedge compression fracture of T11-T12 vertebra, initial encounter for closed fracture: Secondary | ICD-10-CM | POA: Diagnosis not present

## 2019-04-30 ENCOUNTER — Other Ambulatory Visit: Payer: Self-pay | Admitting: Neurosurgery

## 2019-05-01 ENCOUNTER — Telehealth: Payer: Self-pay

## 2019-05-01 ENCOUNTER — Telehealth: Payer: Self-pay | Admitting: Physician Assistant

## 2019-05-01 ENCOUNTER — Other Ambulatory Visit: Payer: Self-pay | Admitting: Cardiology

## 2019-05-01 DIAGNOSIS — I5032 Chronic diastolic (congestive) heart failure: Secondary | ICD-10-CM

## 2019-05-01 NOTE — Telephone Encounter (Signed)
[facility] Visit Initial Request  Agency Requested:    [facility] Contact:  Glory Buff, NP [address], [place] [address] Phone #:  [phone] Fax #:  [phone]  Patient Demographic Information: Name:  Christina Morgan Age:  85 y.o.   DOB:  [birth year]  MRN:  [id number]   Address:   [place] [address]   Phone Numbers:   Home Phone [phone]  Mobile [phone]     Emergency Contact Information on File:   Contact Information    Name Relation Home Work Mobile   [place] Daughter   [phone]   Morga,William "Kandace Parkins   [phone]      The above family members may be contacted for information on this patient (review DPR on file):  Yes   Call patient's Daughter Adraine Biffle: [phone]  Patient Clinical Information:  Primary Care Provider:  Seward Carol, MD  Primary Cardiologist:  Fransico Him, MD  Primary Electrophysiologist:  None   Requesting Provider:  Fabian Sharp [facility]  Past Medical Hx: [name]  has a past medical history of Atrial fibrillation ([place]), Cancer ([place]), Dyslipidemia, Fracture of left humerus, History of cardiovascular stress test, echocardiogram, Hyperlipidemia, Hypertension, Lipoma of skin, and Osteopenia.   Allergies: She is allergic to penicillins.   Medications: Current Outpatient Medications on File Prior to Visit  Medication Sig  . amLODipine (NORVASC) 5 MG tablet Take 1.5 tablets (7.5 mg total) by mouth daily.  Marland Kitchen apixaban (ELIQUIS) 5 MG TABS tablet Take 5 mg by mouth 2 (two) times daily.  Marland Kitchen atenolol (TENORMIN) 50 MG tablet TAKE 1 TABLET BY MOUTH TWICE DAILY  . atorvastatin (LIPITOR) 10 MG tablet Take 10 mg by mouth at bedtime.   . flecainide (TAMBOCOR) 100 MG tablet TAKE 1 TABLET BY MOUTH TWICE DAILY  . furosemide (LASIX) 40 MG tablet Take 1 tablet (40 mg total) by mouth 2 (two) times daily.  Marland Kitchen HYDROcodone-acetaminophen  (NORCO) 7.5-325 MG tablet Take 1 tablet by mouth every 6 (six) hours as needed for moderate pain.  . megestrol (MEGACE) 40 MG tablet Take 40 mg by mouth 2 (two) times daily. Take for 3 weeks , then alternate with tamoxifen 20 mg 1 daily for 3 weeks, etc. - starting medication on 04/14/2019  . potassium chloride SA (K-DUR,KLOR-CON) 20 MEQ tablet TAKE 1 TABLET BY MOUTH DAILY   No current facility-administered medications on file prior to visit.      Social Hx: She  reports that she has never smoked. She has never used smokeless tobacco. She reports that she does not drink alcohol or use drugs.    Diagnosis/Reason for Visit:    Patient has a history of diastolic heart failure, endometrial cancer with recurrence, and recent back injury in which surgery did not help. I contacted the patient to complete cardiac clearance for steroid injections in her back. When I spoke with her daughter (her primary caregiver), she relayed that the patient doesn't move around secondary to sever back pain. She doesn't weigh herself, but she states her legs have more swelling than normal. She is not SOB and denies orthopnea. She sleeps in a recliner due to back pain and her feet are not elevated. In addition, she is to restart her megace next week which causes excess swelling in her extremities. The above combined with steroid injections next week - I am concerned about heart failure exacerbation. She is on daily lasix and states she  may not be voiding as much as usual. Her last labs on 04/12/19 showed a mild reduction in renal function (sCr 1.33, baseline normal). I do not want to change lasix dose without repeat labs. She would be high risk to bring into the office for an evaluation/labs.    Services Requested:  Vital Signs (BP, Pulse, O2, Weight)  Physical Exam  Labs:  BMP, BNP  Assess for Other Rehab/Nursing Needs  # of Visits Needed/Frequency per Week: Twice per week to start  A copy of the office note will be  faxed with this form. All labs ordered for this home visit have been released.

## 2019-05-01 NOTE — Telephone Encounter (Signed)
   [facility] Pre-operative Risk Assessment    Request for surgical clearance:  1. What type of surgery is being performed? Facet Injection Bilateral    2. When is this surgery scheduled? 05/07/19   3. What type of clearance is required (medical clearance vs. Pharmacy clearance to hold med vs. Both)? Medication  4. Are there any medications that need to be held prior to surgery and how long? Eliquis 3 days prior   5. Practice name and name of physician performing surgery? [facility]... DR. Clydell Hakim   6. What is your office phone number [phone]    7.   What is your office fax number         [phone]  8.   Anesthesia type (None, local, MAC, general) ?

## 2019-05-01 NOTE — Telephone Encounter (Signed)
Great Thanks Angie

## 2019-05-01 NOTE — Telephone Encounter (Signed)
Patient with diagnosis of afib on Eliquis for anticoagulation.    Procedure: Facet Injection Bilateral   Date of procedure: 05/07/2019  CHADS2-VASc score of  5 (CHF, HTN, AGE, DM2, stroke/tia x 2, CAD, AGE, female)  CrCl 39ml/min  Per office protocol, patient can hold Eliquis for 3 days prior to procedure.

## 2019-05-01 NOTE — Telephone Encounter (Signed)
   Primary Cardiologist: Fransico Him, MD  Chart reviewed as part of pre-operative protocol coverage.   SUNNI RICHARDSON was last seen on 01/04/19 by Dr. Radford Pax. She has since had a reassuring echocardiogram.  I called and spoke with her daughter, Kimberley, who is her main caregiver. She is having some lower extremity swelling, but no orthopnea or SOB. This sounds secondary to being sedentary from back pain.  We were asked to comment on holding anticoagulation.   Per our pharmacy staff: Patient with diagnosis of afib on Eliquis for anticoagulation.    Procedure: Facet Injection Bilateral Date of procedure: 05/07/2019  CHADS2-VASc score of  5 (CHF, HTN, AGE, DM2, stroke/tia x 2, CAD, AGE, female)  CrCl 68ml/min  Per office protocol, patient can hold Eliquis for 3 days prior to procedure   I will route this recommendation to the requesting party via [place] fax function and remove from pre-op pool.  Please call with questions.  [place], PA 05/01/2019, 3:25 PM

## 2019-05-03 ENCOUNTER — Telehealth: Payer: Self-pay

## 2019-05-03 NOTE — Telephone Encounter (Signed)
° °  Patient's daughter is calling to clarify instructions regarding care and labs  Call patient's Daughter Veera [place]apleton: [phone]

## 2019-05-07 DIAGNOSIS — M5416 Radiculopathy, lumbar region: Secondary | ICD-10-CM | POA: Diagnosis not present

## 2019-05-07 DIAGNOSIS — I5032 Chronic diastolic (congestive) heart failure: Secondary | ICD-10-CM | POA: Diagnosis not present

## 2019-05-08 DIAGNOSIS — S32010A Wedge compression fracture of first lumbar vertebra, initial encounter for closed fracture: Secondary | ICD-10-CM | POA: Diagnosis not present

## 2019-05-08 DIAGNOSIS — M5126 Other intervertebral disc displacement, lumbar region: Secondary | ICD-10-CM | POA: Diagnosis not present

## 2019-05-08 LAB — BASIC METABOLIC PANEL
BUN/Creatinine Ratio: 16 (ref 12–28)
BUN: 22 mg/dL (ref 8–27)
CO2: 22 mmol/L (ref 20–29)
Calcium: 9.1 mg/dL (ref 8.7–10.3)
Chloride: 103 mmol/L (ref 96–106)
Creatinine, Ser: 1.36 mg/dL — ABNORMAL HIGH (ref 0.57–1.00)
GFR calc Af Amer: 41 mL/min/{1.73_m2} — ABNORMAL LOW (ref >59)
GFR calc non Af Amer: 36 mL/min/{1.73_m2} — ABNORMAL LOW (ref >59)
Glucose: 176 mg/dL — ABNORMAL HIGH (ref 65–99)
Potassium: 3.8 mmol/L (ref 3.5–5.2)
Sodium: 142 mmol/L (ref 134–144)

## 2019-05-08 LAB — BRAIN NATRIURETIC PEPTIDE: BNP: 186.6 pg/mL — ABNORMAL HIGH (ref 0.0–100.0)

## 2019-05-09 DIAGNOSIS — M5416 Radiculopathy, lumbar region: Secondary | ICD-10-CM | POA: Diagnosis not present

## 2019-05-16 DIAGNOSIS — C541 Malignant neoplasm of endometrium: Secondary | ICD-10-CM | POA: Diagnosis not present

## 2019-05-16 DIAGNOSIS — M5416 Radiculopathy, lumbar region: Secondary | ICD-10-CM | POA: Diagnosis not present

## 2019-05-16 DIAGNOSIS — S22080A Wedge compression fracture of T11-T12 vertebra, initial encounter for closed fracture: Secondary | ICD-10-CM | POA: Diagnosis not present

## 2019-05-21 DIAGNOSIS — R600 Localized edema: Secondary | ICD-10-CM | POA: Diagnosis not present

## 2019-05-21 DIAGNOSIS — M7989 Other specified soft tissue disorders: Secondary | ICD-10-CM | POA: Diagnosis not present

## 2019-05-21 DIAGNOSIS — R238 Other skin changes: Secondary | ICD-10-CM | POA: Diagnosis not present

## 2019-06-04 ENCOUNTER — Other Ambulatory Visit: Payer: Self-pay | Admitting: Cardiology

## 2019-06-06 ENCOUNTER — Telehealth: Payer: Self-pay | Admitting: Cardiology

## 2019-06-06 NOTE — Telephone Encounter (Signed)
  Pt c/o swelling: STAT is pt has developed SOB within 24 hours  1) How much weight have you gained and in what time span? Not sure  2) If swelling, where is the swelling located? Legs, ankles, feet  3) Are you currently taking a fluid pill? Yes, not helping  4) Are you currently SOB? no  5) Do you have a log of your daily weights (if so, list)? no  6) Have you gained 3 pounds in a day or 5 pounds in a week? Not sure  7) Have you traveled recently? Lasix was increased for 5 days by PCP for cellulitis but it has not helped her swelling. Daughter would like to see if she can be seen sooner than her 06/21/19 appt with Dr Radford Pax and she would like to bring her into the office

## 2019-06-06 NOTE — Telephone Encounter (Signed)

## 2019-06-06 NOTE — Telephone Encounter (Signed)
Spoke with the daughter, per dpr, the patient needs to be seen because her swelling has not improved. I scheduled her with Melina Copa PA-C on 06/07/19.

## 2019-06-07 ENCOUNTER — Ambulatory Visit (INDEPENDENT_AMBULATORY_CARE_PROVIDER_SITE_OTHER): Payer: Medicare Other | Admitting: Physician Assistant

## 2019-06-07 ENCOUNTER — Other Ambulatory Visit: Payer: Self-pay

## 2019-06-07 ENCOUNTER — Encounter: Payer: Self-pay | Admitting: Physician Assistant

## 2019-06-07 VITALS — BP 130/60 | HR 59 | Ht 66.0 in | Wt 174.8 lb

## 2019-06-07 DIAGNOSIS — I480 Paroxysmal atrial fibrillation: Secondary | ICD-10-CM | POA: Diagnosis not present

## 2019-06-07 DIAGNOSIS — R600 Localized edema: Secondary | ICD-10-CM

## 2019-06-07 DIAGNOSIS — N183 Chronic kidney disease, stage 3 unspecified: Secondary | ICD-10-CM

## 2019-06-07 DIAGNOSIS — I5033 Acute on chronic diastolic (congestive) heart failure: Secondary | ICD-10-CM

## 2019-06-07 DIAGNOSIS — I5032 Chronic diastolic (congestive) heart failure: Secondary | ICD-10-CM | POA: Diagnosis not present

## 2019-06-07 MED ORDER — TORSEMIDE 20 MG PO TABS: ORAL_TABLET | ORAL | 3 refills | Status: DC

## 2019-06-07 MED ORDER — AMLODIPINE BESYLATE 5 MG PO TABS: 5.0000 mg | ORAL_TABLET | Freq: Every day | ORAL | 3 refills | Status: DC

## 2019-06-07 NOTE — Progress Notes (Signed)
Cardiology Office Note    Date:  06/07/2019  ID:  [name], [name] 1933/08/28, MRN [id number] PCP:  Seward Carol, MD  Cardiologist:  Fransico Him, MD   Chief Complaint: lower extremity edema  History of Present Illness:  Christina Morgan is a 85 y.o. female with history of PAF on Eliquis and flecainide, HTN, HLD (managed by PCP), chronic lower extremity edema, chronic diastolic CHF, CKD III per labs, endometrial CA, mildly dilated ascending aorta (f/u due 01/2020) who presents for evaluation of lower extremity edema. Nuc in 12/2018 was normal. Last echo 01/2019 showed EF 60-65%, borderline increased LV thickeness, pseudonormalization (grade 2 DD), normal RV, severely dilated LA size, mild calcification of AV. Event monitor 01/2019 showed sinus brady to sinus tach, 49bpm-118bpm, occasional PACs/PVCs. Last labs 04/2019 K 3.8, CR 1.36 (1 range last year), BNP 186, Hgb 12.0, 12/2018 TSH wnl, 2017 LDL 81.   She is here with daughter Christina Morgan. She returns for follow-up today for evaluation of worsening lower extremity edema.  It has been culminating for several months. A few weeks ago she was treated with a round of doxycycline for suspected cellulitis and briefly took Lasix 80mg  QAM/40mg  QPM fo a few days. The swelling has persisted. At one point it got so bad it was weeping but not currently doing so. She sleeps in a chair due to chronic back problems. She is very sedentary due to this. She enjoys [employer] movies. No chest pain or palpitations. Daughter also has noted megace has worsened edema in the past but indicates oncology really wanted her to be on this for a few months.   Past Medical History:  Diagnosis Date   Cancer [facility])    endometrial ca   Chronic diastolic CHF (congestive heart failure) (HCC)    Chronic edema    CKD (chronic kidney disease), stage III (HCC)    Dyslipidemia    Fracture of left humerus    History of cardiovascular stress test    Lexiscan Myoview (06/2014):  No ischemia or scar, not gated, low risk   Hx of echocardiogram    Echo (05/02/14): EF 60% to 65%. No regional wall motion abnormalities. Mild AI, mildly dilated aortic root (37 mm), trivial MR, trivial TR   Hyperlipidemia    Hypertension    Lipoma of skin    multiple   Osteopenia    PAF (paroxysmal atrial fibrillation) ([place])    failed DCCV 05/2014 >> Flecainide started >> DCCV 7/15 sucessful;  f/u ETT neg for pro-arrhythmia >> recurrent AF/AFL >> Flecainide inc to 100 bid with repeat DCCV 9/15    Past Surgical History:  Procedure Laterality Date   CARDIOVERSION N/A 06/05/2014   Procedure: CARDIOVERSION;  Surgeon: Sueanne Margarita, MD;  Location: [place];  Service: Cardiovascular;  Laterality: N/A;   CARDIOVERSION N/A 07/11/2014   Procedure: CARDIOVERSION;  Surgeon: Sueanne Margarita, MD;  Location: [place];  Service: Cardiovascular;  Laterality: N/A;   CARDIOVERSION N/A 09/04/2014   Procedure: CARDIOVERSION;  Surgeon: Sueanne Margarita, MD;  Location: [facility] ENDOSCOPY;  Service: Cardiovascular;  Laterality: N/A;   COLONOSCOPY  2008   COLONOSCOPY N/A 05/20/2015   Procedure: COLONOSCOPY;  Surgeon: Ronald Lobo, MD;  Location: [facility] ENDOSCOPY;  Service: Endoscopy;  Laterality: N/A;   IR KYPHO EA ADDL LEVEL THORACIC OR LUMBAR  04/12/2019   IR KYPHO THORACIC WITH BONE BIOPSY  04/12/2019   ROBOTIC ASSISTED SUPRACERVICAL HYSTERECTOMY WITH BILATERAL SALPINGO OOPHERECTOMY  11/06/2012   and bilateral pelvic lymph node  dissection    Current Medications: Current Meds  Medication Sig   apixaban (ELIQUIS) 5 MG TABS tablet Take 5 mg by mouth 2 (two) times daily.   atenolol (TENORMIN) 50 MG tablet TAKE 1 TABLET BY MOUTH TWICE DAILY   atorvastatin (LIPITOR) 10 MG tablet Take 10 mg by mouth at bedtime.    flecainide (TAMBOCOR) 100 MG tablet TAKE 1 TABLET BY MOUTH TWICE DAILY   megestrol (MEGACE) 40 MG tablet Take 40 mg by mouth 2 (two) times daily. Take for 3 weeks , then alternate with  tamoxifen 20 mg 1 daily for 3 weeks, etc. - starting medication on 04/14/2019   potassium chloride SA (K-DUR) 20 MEQ tablet TAKE 1 TABLET BY MOUTH DAILY   tamoxifen (NOLVADEX) 20 MG tablet Take by mouth. Use as directed   [DISCONTINUED] amLODipine (NORVASC) 5 MG tablet Take 1.5 tablets (7.5 mg total) by mouth daily.   [DISCONTINUED] furosemide (LASIX) 40 MG tablet Take 1 tablet (40 mg total) by mouth 2 (two) times daily.     Allergies:   Penicillins   Social History   Socioeconomic History   Marital status: Widowed    Spouse name: Not on file   Number of children: Not on file   Years of education: Not on file   Highest education level: Not on file  Occupational History   Not on file  Social Needs   Financial resource strain: Not on file   Food insecurity    Worry: Not on file    Inability: Not on file   Transportation needs    Medical: Not on file    Non-medical: Not on file  Tobacco Use   Smoking status: Never Smoker   Smokeless tobacco: Never Used  Substance and Sexual Activity   Alcohol use: No   Drug use: No   Sexual activity: Not on file  Lifestyle   Physical activity    Days per week: Not on file    Minutes per session: Not on file   Stress: Not on file  Relationships   Social connections    Talks on phone: Not on file    Gets together: Not on file    Attends religious service: Not on file    Active member of club or organization: Not on file    Attends meetings of clubs or organizations: Not on file    Relationship status: Not on file  Other Topics Concern   Not on file  Social History Narrative   Not on file     Family History:  The patient's family history includes Cancer (age of onset: 42) in her father; Coronary artery disease in her mother; Heart attack in her mother.  ROS:   Please see the history of present illness.  All other systems are reviewed and otherwise negative.    PHYSICAL EXAM:   VS:  BP 130/60    Pulse (!) 59     Ht 5\' 6"  (1.676 m)    Wt 174 lb 12.8 oz (79.3 kg)    SpO2 100%    BMI 28.21 kg/m   BMI: Body mass index is 28.21 kg/m. GEN: Well nourished, well developed WF, in no acute distress HEENT: normocephalic, atraumatic Neck: no JVD, carotid bruits, or masses Cardiac: RRR; no murmurs, rubs, or gallops, 2+ bilateral lower edema with chronic skin thickening and pink appearance although does not appear to have any focal area of infection Respiratory:  clear to auscultation bilaterally, normal work of breathing GI: soft,  nontender, nondistended, + BS MS: no deformity or atrophy Skin: warm and dry, no rash Neuro:  Alert and Oriented x 3, Strength and sensation are intact, follows commands Psych: euthymic mood, full affect  Wt Readings from Last 3 Encounters:  06/07/19 174 lb 12.8 oz (79.3 kg)  04/12/19 166 lb (75.3 kg)  01/10/19 166 lb (75.3 kg)      Studies/Labs Reviewed:   EKG:  EKG was ordered today and personally reviewed by me and demonstrates sinus bradycardia 59bpm with first degree AVB no acute changes  Recent Labs: 01/04/2019: NT-Pro BNP 593; TSH 2.960 04/12/2019: Hemoglobin 12.0; Platelets 263 05/07/2019: BNP 186.6; BUN 22; Creatinine, Ser 1.36; Potassium 3.8; Sodium 142   Lipid Panel    Component Value Date/Time   CHOL 175 03/21/2016 0915   TRIG 155 (H) 03/21/2016 0915   HDL 63 03/21/2016 0915   CHOLHDL 2.8 03/21/2016 0915   VLDL 31 (H) 03/21/2016 0915   LDLCALC 81 03/21/2016 0915    Additional studies/ records that were reviewed today include: Summarized above   ASSESSMENT & PLAN:   1. Lower extremity edema - based on degree of swelling and BNP only 186 last month I would also be concerned for a component of venous insufficiency in the setting of amlodipine use while primarily seated with limited activity. Will d/c Lasix and transition to torsemide, utilizing a dose of 40mg  QAM/20mg  QPM. Will also concomitantly decrease amlodipine to 5mg  daily. I considered discontinuing  amlodipine altogether but this could pose a challenge for BP control. I would be hesitant to add ACEI/ARB as a substitute given her CKD while diuresing, and her HR does not afford much room to increase atenolol. Hydralazine could be considered going forward. Given chronic nature of swelling as well as current therapeutic treatment for afib with anticoagulation, DVT is less likely although ultrasound might be considered if swelling fails to improve with above measures. Also discussed role of ACE bandage wraps instead of compression hose, and elevation when possible. They are looking at getting her a lift chair for sleeping which I hope will also help. 2. Possible component of acute on chronic diastolic CHF - follow with changes above. Reviewed 2g sodium restriction, 2L fluid restriction with patient. Daughter and pt will report early next week how she is doing. 3. Paroxysmal atrial fibrillation - maintaining NSR on current regimen. Follow. EF was normal recently. No symptoms to suggest angina. Eliquis dose remains appropriate. 4. CKD stage III - will check BMET today and again in 1 week.  Disposition: F/u with Dr. Radford Pax as scheduled 06/21/19.  Medication Adjustments/Labs and Tests Ordered: Current medicines are reviewed at length with the patient today.  Concerns regarding medicines are outlined above. Medication changes, Labs and Tests ordered today are summarized above and listed in the Patient Instructions accessible in Encounters.   Signed, Charlie Pitter, PA-C  06/07/2019 5:11 PM    [facility] [address], [place],   [address] Phone: [phone]; Fax: [phone]

## 2019-06-07 NOTE — Patient Instructions (Addendum)
Medication Instructions:  Your physician has recommended you make the following change in your medication:  1.  STOP Lasix 2.  REDUCE Amlodipine to 5 mg daily 3.  START Torsemide 20 mg taking 2 tablets in morning and 1 tablet in afternoon   If you need a refill on your cardiac medications before your next appointment, please call your pharmacy.   Lab work: TODAY:  BMET 1 WEEK 06/13/2019:  BMET  If you have labs (blood work) drawn today and your tests are completely normal, you will receive your results only by: Marland Kitchen MyChart Message (if you have MyChart) OR . A paper copy in the mail If you have any lab test that is abnormal or we need to change your treatment, we will call you to review the results.  Testing/Procedures: None ordered  Follow-Up: At [facility], you and your health needs are our priority.  As part of our continuing mission to provide you with exceptional heart care, we have created designated Provider Care Teams.  These Care Teams include your primary Cardiologist (physician) and Advanced Practice Providers (APPs -  Physician Assistants and Nurse Practitioners) who all work together to provide you with the care you need, when you need it. Marland Kitchen KEEP YOUR SCHEDULED FOLLOW-UP APPOINTMENT WITH DR. Radford Pax 06/21/2019  Any Other Special Instructions Will Be Listed Below (If Applicable).

## 2019-06-08 LAB — BASIC METABOLIC PANEL
BUN/Creatinine Ratio: 19 (ref 12–28)
BUN: 21 mg/dL (ref 8–27)
CO2: 24 mmol/L (ref 20–29)
Calcium: 8.8 mg/dL (ref 8.7–10.3)
Chloride: 103 mmol/L (ref 96–106)
Creatinine, Ser: 1.12 mg/dL — ABNORMAL HIGH (ref 0.57–1.00)
GFR calc Af Amer: 52 mL/min/{1.73_m2} — ABNORMAL LOW (ref >59)
GFR calc non Af Amer: 45 mL/min/{1.73_m2} — ABNORMAL LOW (ref >59)
Glucose: 109 mg/dL — ABNORMAL HIGH (ref 65–99)
Potassium: 4.3 mmol/L (ref 3.5–5.2)
Sodium: 140 mmol/L (ref 134–144)

## 2019-06-10 ENCOUNTER — Telehealth: Payer: Self-pay | Admitting: *Deleted

## 2019-06-10 NOTE — Telephone Encounter (Signed)
Pt's daughter, [place], [place] on file, returned the call and has been made aware of pts lab results and recommendations.  See result note.

## 2019-06-10 NOTE — Telephone Encounter (Signed)
Called pt re: lab results, left a message for pt to call back.  

## 2019-06-10 NOTE — Telephone Encounter (Signed)
-----   Message from Charlie Pitter, [place] sent at 06/10/2019  7:45 AM EDT ----- Please let patient know BMET is stable. Mild renal insufficiency noted, stable and consistent with prior values. I had already reviewed with pt to avoid NSAIDS (just documenting here). Dayna Dunn PA-C

## 2019-06-13 ENCOUNTER — Other Ambulatory Visit: Payer: Medicare Other | Admitting: *Deleted

## 2019-06-13 ENCOUNTER — Other Ambulatory Visit: Payer: Self-pay

## 2019-06-13 DIAGNOSIS — I5033 Acute on chronic diastolic (congestive) heart failure: Secondary | ICD-10-CM

## 2019-06-13 DIAGNOSIS — N183 Chronic kidney disease, stage 3 unspecified: Secondary | ICD-10-CM

## 2019-06-13 DIAGNOSIS — R600 Localized edema: Secondary | ICD-10-CM | POA: Diagnosis not present

## 2019-06-13 DIAGNOSIS — I480 Paroxysmal atrial fibrillation: Secondary | ICD-10-CM

## 2019-06-14 LAB — BASIC METABOLIC PANEL
BUN/Creatinine Ratio: 19 (ref 12–28)
BUN: 27 mg/dL (ref 8–27)
CO2: 26 mmol/L (ref 20–29)
Calcium: 8.9 mg/dL (ref 8.7–10.3)
Chloride: 101 mmol/L (ref 96–106)
Creatinine, Ser: 1.42 mg/dL — ABNORMAL HIGH (ref 0.57–1.00)
GFR calc Af Amer: 39 mL/min/{1.73_m2} — ABNORMAL LOW (ref >59)
GFR calc non Af Amer: 34 mL/min/{1.73_m2} — ABNORMAL LOW (ref >59)
Glucose: 121 mg/dL — ABNORMAL HIGH (ref 65–99)
Potassium: 3.7 mmol/L (ref 3.5–5.2)
Sodium: 142 mmol/L (ref 134–144)

## 2019-06-20 ENCOUNTER — Telehealth: Payer: Self-pay | Admitting: Cardiology

## 2019-06-20 NOTE — Telephone Encounter (Signed)

## 2019-06-21 ENCOUNTER — Other Ambulatory Visit: Payer: Self-pay

## 2019-06-21 ENCOUNTER — Encounter: Payer: Self-pay | Admitting: Cardiology

## 2019-06-21 ENCOUNTER — Telehealth (INDEPENDENT_AMBULATORY_CARE_PROVIDER_SITE_OTHER): Payer: Medicare Other | Admitting: Cardiology

## 2019-06-21 DIAGNOSIS — I10 Essential (primary) hypertension: Secondary | ICD-10-CM | POA: Diagnosis not present

## 2019-06-21 DIAGNOSIS — I4819 Other persistent atrial fibrillation: Secondary | ICD-10-CM

## 2019-06-21 DIAGNOSIS — I5032 Chronic diastolic (congestive) heart failure: Secondary | ICD-10-CM | POA: Diagnosis not present

## 2019-06-21 DIAGNOSIS — R600 Localized edema: Secondary | ICD-10-CM | POA: Diagnosis not present

## 2019-06-21 DIAGNOSIS — R0602 Shortness of breath: Secondary | ICD-10-CM | POA: Diagnosis not present

## 2019-06-21 MED ORDER — HYDRALAZINE HCL 10 MG PO TABS: 10.0000 mg | ORAL_TABLET | Freq: Two times a day (BID) | ORAL | 3 refills | Status: DC

## 2019-06-21 MED ORDER — TORSEMIDE 20 MG PO TABS: 40.0000 mg | ORAL_TABLET | Freq: Two times a day (BID) | ORAL | 3 refills | Status: DC

## 2019-06-21 NOTE — Progress Notes (Signed)
Virtual Visit via Video Note   This visit type was conducted due to national recommendations for restrictions regarding the COVID-19 Pandemic (e.g. social distancing) in an effort to limit this patient's exposure and mitigate transmission in our community.  Due to her co-morbid illnesses, this patient is at least at moderate risk for complications without adequate follow up.  This format is felt to be most appropriate for this patient at this time.  All issues noted in this document were discussed and addressed.  A limited physical exam was performed with this format.  Please refer to the patient's chart for her consent to telehealth for [facility].   Evaluation Performed:  Follow-up visit  This visit type was conducted due to national recommendations for restrictions regarding the COVID-19 Pandemic (e.g. social distancing).  This format is felt to be most appropriate for this patient at this time.  All issues noted in this document were discussed and addressed.  No physical exam was performed (except for noted visual exam findings with Video Visits).  Please refer to the patient's chart (MyChart message for video visits and phone note for telephone visits) for the patient's consent to telehealth for [facility].  Date:  06/21/2019   ID:  Christina, Morgan [birth year], MRN [id number]  Patient Location:  Home  Provider location:   [place]  PCP:  Seward Carol, MD  Cardiologist:  Fransico Him, MD  Electrophysiologist:  None   Chief Complaint:  Atrial fibrillation, CHF and HTN  History of Present Illness:    Christina Morgan is a 85 y.o. female who presents via audio/video conferencing for a telehealth visit today.    Christina Morgan is an 85 y.o. female with a hx of paroxysmal atrial fibrillation on Eliquis and flecainide, hypertension, hyperlipidemia, and chronic lower extremity edema. Shealso has a history of chronic diastolic CHF withEchoshowingLVEF 55-60%  with elevated ventricular filling pressures and atrial filling pressures, trivial pericardial effusionand moderate aortic insufficiency. She also has chronic lower extremity edema.  She was seen by Melina Copa, PA 06/07/2019 for worsening LE edema and had been treated with antbx for possible cellulitis.  Her lasix was also increased but LE edema persisted.  She had been very sedentary and was felt to have a component of chronic venous insuff exacerbated by amlodipine.  Her amlodipine was decreased from 10mg  to 5mg  daily.  Her Lasix was subsequently changed to Demadex 40mg  qam and 2-mg qpm.    She is here today for followup and is still complaining of LE edema.  She thinks it may have gotten slightly better with the change to Torsemide.  Unfortunately she is unable to get TED stockings on her legs.  She is very sedentary according to her daughter.  She has a recliner but cannot get it recline enough to keep her feet above the level of her heart.  She still has some DOE but her daughter thinks part of it is because she is so sedentary and deconditioned.  She denies any chest pain or pressure, PND, orthopnea, dizziness, palpitations or syncope. She is compliant with her meds and is tolerating meds with no SE.    The patient does not have symptoms concerning for COVID-19 infection (fever, chills, cough, or new shortness of breath).    Prior CV studies:   The following studies were reviewed today:  none  Past Medical History:  Diagnosis Date  . Cancer [facility])    endometrial ca  . Chronic diastolic CHF (congestive heart  failure) ([place])   . Chronic edema   . CKD (chronic kidney disease), stage III ([place])   . Dyslipidemia   . Fracture of left humerus   . History of cardiovascular stress test    Lexiscan Myoview (06/2014): No ischemia or scar, not gated, low risk  . Hx of echocardiogram    Echo (05/02/14): EF 60% to 65%. No regional wall motion abnormalities. Mild AI, mildly dilated aortic root (37 mm),  trivial MR, trivial TR  . Hyperlipidemia   . Hypertension   . Lipoma of skin    multiple  . Osteopenia   . PAF (paroxysmal atrial fibrillation) ([place])    failed DCCV 05/2014 >> Flecainide started >> DCCV 7/15 sucessful;  f/u ETT neg for pro-arrhythmia >> recurrent AF/AFL >> Flecainide inc to 100 bid with repeat DCCV 9/15   Past Surgical History:  Procedure Laterality Date  . CARDIOVERSION N/A 06/05/2014   Procedure: CARDIOVERSION;  Surgeon: Sueanne Margarita, MD;  Location: [place];  Service: Cardiovascular;  Laterality: N/A;  . CARDIOVERSION N/A 07/11/2014   Procedure: CARDIOVERSION;  Surgeon: Sueanne Margarita, MD;  Location: [facility] ENDOSCOPY;  Service: Cardiovascular;  Laterality: N/A;  . CARDIOVERSION N/A 09/04/2014   Procedure: CARDIOVERSION;  Surgeon: Sueanne Margarita, MD;  [facility] ENDOSCOPY;  Service: Cardiovascular;  Laterality: N/A;  . COLONOSCOPY  2008  . COLONOSCOPY N/A 05/20/2015   Procedure: COLONOSCOPY;  Surgeon: Ronald Lobo, MD;  Location: [facility] ENDOSCOPY;  Service: Endoscopy;  Laterality: N/A;  . IR KYPHO EA ADDL LEVEL THORACIC OR LUMBAR  04/12/2019  . IR KYPHO THORACIC WITH BONE BIOPSY  04/12/2019  . ROBOTIC ASSISTED SUPRACERVICAL HYSTERECTOMY WITH BILATERAL SALPINGO OOPHERECTOMY  11/06/2012   and bilateral pelvic lymph node dissection     Current Meds  Medication Sig  . amLODipine (NORVASC) 5 MG tablet Take 1 tablet (5 mg total) by mouth daily.  Marland Kitchen apixaban (ELIQUIS) 5 MG TABS tablet Take 5 mg by mouth 2 (two) times daily.  Marland Kitchen atenolol (TENORMIN) 50 MG tablet TAKE 1 TABLET BY MOUTH TWICE DAILY  . atorvastatin (LIPITOR) 10 MG tablet Take 10 mg by mouth at bedtime.   . flecainide (TAMBOCOR) 100 MG tablet TAKE 1 TABLET BY MOUTH TWICE DAILY  . megestrol (MEGACE) 40 MG tablet Take 40 mg by mouth 2 (two) times daily. Take for 3 weeks , then alternate with tamoxifen 20 mg 1 daily for 3 weeks, etc. - starting medication on 04/14/2019  . potassium chloride SA (K-DUR) 20 MEQ tablet TAKE 1  TABLET BY MOUTH DAILY  . tamoxifen (NOLVADEX) 20 MG tablet Take by mouth. Use as directed  . torsemide (DEMADEX) 20 MG tablet Take 2 tablets by mouth every morning and 1 tablet by mouth every afternoon     Allergies:   Penicillins   Social History   Tobacco Use  . Smoking status: Never Smoker  . Smokeless tobacco: Never Used  Substance Use Topics  . Alcohol use: No  . Drug use: No     Family Hx: The patient's family history includes Cancer (age of onset: 39) in her father; Coronary artery disease in her mother; Heart attack in her mother.  ROS:   Please see the history of present illness.     All other systems reviewed and are negative.   Labs/Other Tests and Data Reviewed:    Recent Labs: 01/04/2019: NT-Pro BNP 593; TSH 2.960 04/12/2019: Hemoglobin 12.0; Platelets 263 05/07/2019: BNP 186.6 06/13/2019: BUN 27; Creatinine, Ser 1.42; Potassium 3.7; Sodium 142  Recent Lipid Panel Lab Results  Component Value Date/Time   CHOL 175 03/21/2016 09:15 AM   TRIG 155 (H) 03/21/2016 09:15 AM   HDL 63 03/21/2016 09:15 AM   CHOLHDL 2.8 03/21/2016 09:15 AM   LDLCALC 81 03/21/2016 09:15 AM    Wt Readings from Last 3 Encounters:  06/21/19 175 lb (79.4 kg)  06/07/19 174 lb 12.8 oz (79.3 kg)  04/12/19 166 lb (75.3 kg)     Objective:    Vital Signs:  BP 130/62   Pulse 63   Ht 5\' 5"  (1.651 m)   Wt 175 lb (79.4 kg)   BMI 29.12 kg/m    CONSTITUTIONAL:  Well nourished, well developed female in no acute distress.  EYES: anicteric MOUTH: oral mucosa is pink RESPIRATORY: Normal respiratory effort, symmetric expansion CARDIOVASCULAR: No peripheral edema SKIN: No rash, lesions or ulcers MUSCULOSKELETAL: no digital cyanosis NEURO: Cranial Nerves II-XII grossly intact, moves all extremities PSYCH: Intact judgement and insight.  A&O x 3, Mood/affect appropriate   ASSESSMENT & PLAN:    1.  Chronic diastolic CHF -she remains volume overloaded on exam with persistent LE edema and SOB  -she admits to eating some chips -creatinine was 1.42 on 06/13/2019 -I suspect that she is getting added Na in her diet -2D echo recently showed normal LVF with diastolic dysfunction -increase Torsemide to 40mg  BID -follow strict <2gm Na diet  2.  Persistent atrial fibrillation  -she thinks she is in normal rhythm -she has not had any palpitations -continue atenolol 50 mg twice daily, flecainide 100 mg twice daily and apixaban 5 mg twice daily.   -she has not had any bleeding problems on the apixaban  3.  Hypertension -her BP is controlled at home -stop amlodipine due to LE edema -atenolol 50mg  BID -start Hydralazine 10mg  BID -I have asked her to check her BP daily at lunch for a week and call with the results .  4.  Chronic lower extremity edema -she continues to have issues with LE edema that are likely multifactorial from chronic venous insuff, sedentary state with her legs hanging down to sit and dietary indiscretion with Na along with possible effect from amlodipine and Megace -stop amlodipine as above -encourage her daughter to discuss the Megace with her Oncologist  5.  SOB  -2D echo 01/2019 showed normal LVF with increased stiffness of heart muscle, severe LAE -BNP was mildly elevated -lexiscan myoview 12/2018 showed no ischemia -her diuretics were changed to Torsemide and she still has SOB but she is very sedentary and only sits around. -I am going to increase Torsemide 40mg  BID but I suspect that most of her SOB is related to deconditioning from sedentary state.   COVID-19 Education: The signs and symptoms of COVID-19 were discussed with the patient and how to seek care for testing (follow up with PCP or arrange E-visit).  The importance of social distancing was discussed today.  Patient Risk:   After full review of this patient's clinical status, I feel that they are at least moderate risk at this time.  Time:   Today, I have spent 25 minutes directly with the  patient on telemedicine video discussing medical problems including CHF, LE edema, PAF, SOB.  We also reviewed the symptoms of COVID 19 and the ways to protect against contracting the virus with telehealth technology.  I spent an additional 5 minutes reviewing patient's chart including stress test, 2D echo and labs.  Medication Adjustments/Labs and Tests Ordered: Current medicines are reviewed  at length with the patient today.  Concerns regarding medicines are outlined above.  Tests Ordered: No orders of the defined types were placed in this encounter.  Medication Changes: No orders of the defined types were placed in this encounter.   Disposition:  Follow up in 2 week(s)  Signed, Fransico Him, MD  06/21/2019 1:34 PM    [facility]

## 2019-06-21 NOTE — Patient Instructions (Addendum)
Medication Instructions:  1) STOP AMLODIPINE 2) START HYDRALAZINE 10 mg twice daily 3) INCREASE TORSEMIDE to 40 mg twice daily  Labwork: Your provider recommends that you return for lab work in [place].  Follow-Up: Your provider recommends that you schedule a follow-up appointment in 2 weeks.  Any Other Special Instructions Will Be Listed Below (If Applicable). Wrap legs with ace bandages during the day and elevate legs when sitting.   Please check your blood pressure daily at lunch for a week. And bring your blood pressure readings to drop off when you have your blood work.

## 2019-06-26 ENCOUNTER — Telehealth: Payer: Self-pay | Admitting: Cardiology

## 2019-06-26 DIAGNOSIS — I4891 Unspecified atrial fibrillation: Secondary | ICD-10-CM | POA: Diagnosis not present

## 2019-06-26 DIAGNOSIS — Z8781 Personal history of (healed) traumatic fracture: Secondary | ICD-10-CM | POA: Diagnosis not present

## 2019-06-26 DIAGNOSIS — G609 Hereditary and idiopathic neuropathy, unspecified: Secondary | ICD-10-CM | POA: Diagnosis not present

## 2019-06-26 DIAGNOSIS — L89152 Pressure ulcer of sacral region, stage 2: Secondary | ICD-10-CM | POA: Diagnosis not present

## 2019-06-26 DIAGNOSIS — Z79810 Long term (current) use of selective estrogen receptor modulators (SERMs): Secondary | ICD-10-CM | POA: Diagnosis not present

## 2019-06-26 DIAGNOSIS — I10 Essential (primary) hypertension: Secondary | ICD-10-CM | POA: Diagnosis not present

## 2019-06-26 DIAGNOSIS — I5032 Chronic diastolic (congestive) heart failure: Secondary | ICD-10-CM | POA: Diagnosis not present

## 2019-06-26 DIAGNOSIS — C55 Malignant neoplasm of uterus, part unspecified: Secondary | ICD-10-CM | POA: Diagnosis not present

## 2019-06-26 NOTE — Telephone Encounter (Signed)
° °  Daughter of patient called and wanted to ask about upcoming labs for the patient.   The daughter spoke with Valetta Fuller on 07/10. The patient had some medication changes discussed at this time. Repeat labs were ordered for a week later. The patient stopped taking the amlodipine and replaced that medication with hydralazine. The patient started taking the new medication that day. The dosage for Torsemide increased from 1 pill in the AM and one pill in the PM, to 2 pills in the AM and 2 pills in the PM. The patient also started with this change on Friday.   The pharmacy that the patient uses did not have the Hydralazine in stock until Tuesday, and so the patient did not start taking It until Tuesday. Valetta Fuller wanted the patient to have labs drawn after a week, but the daughter wants to know if the patient still needs to come in for labs on Friday, as she will not have had a full week on the new medication regimen.   Please let the daughter know and she will bring her Mom in for labs accordingly. You can leave a message on her cell phone

## 2019-06-26 NOTE — Telephone Encounter (Signed)
lmtcb 7/15

## 2019-06-26 NOTE — Telephone Encounter (Signed)
I spoke to patient's daughter and arranged labs for Tuesday 7/22.

## 2019-06-28 ENCOUNTER — Other Ambulatory Visit: Payer: Medicare Other

## 2019-07-01 DIAGNOSIS — L89152 Pressure ulcer of sacral region, stage 2: Secondary | ICD-10-CM | POA: Diagnosis not present

## 2019-07-01 DIAGNOSIS — C55 Malignant neoplasm of uterus, part unspecified: Secondary | ICD-10-CM | POA: Diagnosis not present

## 2019-07-01 DIAGNOSIS — Z79810 Long term (current) use of selective estrogen receptor modulators (SERMs): Secondary | ICD-10-CM | POA: Diagnosis not present

## 2019-07-01 DIAGNOSIS — I4891 Unspecified atrial fibrillation: Secondary | ICD-10-CM | POA: Diagnosis not present

## 2019-07-01 DIAGNOSIS — I10 Essential (primary) hypertension: Secondary | ICD-10-CM | POA: Diagnosis not present

## 2019-07-01 DIAGNOSIS — I5032 Chronic diastolic (congestive) heart failure: Secondary | ICD-10-CM | POA: Diagnosis not present

## 2019-07-02 ENCOUNTER — Other Ambulatory Visit: Payer: Medicare Other

## 2019-07-02 ENCOUNTER — Other Ambulatory Visit: Payer: Self-pay

## 2019-07-02 DIAGNOSIS — I5032 Chronic diastolic (congestive) heart failure: Secondary | ICD-10-CM | POA: Diagnosis not present

## 2019-07-03 LAB — BASIC METABOLIC PANEL
BUN/Creatinine Ratio: 19 (ref 12–28)
BUN: 25 mg/dL (ref 8–27)
CO2: 27 mmol/L (ref 20–29)
Calcium: 8.7 mg/dL (ref 8.7–10.3)
Chloride: 101 mmol/L (ref 96–106)
Creatinine, Ser: 1.34 mg/dL — ABNORMAL HIGH (ref 0.57–1.00)
GFR calc Af Amer: 42 mL/min/{1.73_m2} — ABNORMAL LOW (ref >59)
GFR calc non Af Amer: 36 mL/min/{1.73_m2} — ABNORMAL LOW (ref >59)
Glucose: 101 mg/dL — ABNORMAL HIGH (ref 65–99)
Potassium: 3.5 mmol/L (ref 3.5–5.2)
Sodium: 142 mmol/L (ref 134–144)

## 2019-07-04 ENCOUNTER — Telehealth: Payer: Self-pay

## 2019-07-04 ENCOUNTER — Other Ambulatory Visit: Payer: Self-pay

## 2019-07-04 ENCOUNTER — Telehealth: Payer: Self-pay | Admitting: Cardiology

## 2019-07-04 ENCOUNTER — Encounter: Payer: Self-pay | Admitting: Cardiology

## 2019-07-04 ENCOUNTER — Ambulatory Visit (INDEPENDENT_AMBULATORY_CARE_PROVIDER_SITE_OTHER): Payer: Medicare Other | Admitting: Cardiology

## 2019-07-04 VITALS — BP 122/62 | HR 62 | Ht 65.0 in | Wt 176.2 lb

## 2019-07-04 DIAGNOSIS — I10 Essential (primary) hypertension: Secondary | ICD-10-CM | POA: Diagnosis not present

## 2019-07-04 DIAGNOSIS — I5032 Chronic diastolic (congestive) heart failure: Secondary | ICD-10-CM | POA: Diagnosis not present

## 2019-07-04 DIAGNOSIS — L89152 Pressure ulcer of sacral region, stage 2: Secondary | ICD-10-CM | POA: Diagnosis not present

## 2019-07-04 DIAGNOSIS — C55 Malignant neoplasm of uterus, part unspecified: Secondary | ICD-10-CM | POA: Diagnosis not present

## 2019-07-04 DIAGNOSIS — R600 Localized edema: Secondary | ICD-10-CM | POA: Diagnosis not present

## 2019-07-04 DIAGNOSIS — Z79810 Long term (current) use of selective estrogen receptor modulators (SERMs): Secondary | ICD-10-CM | POA: Diagnosis not present

## 2019-07-04 DIAGNOSIS — I4891 Unspecified atrial fibrillation: Secondary | ICD-10-CM | POA: Diagnosis not present

## 2019-07-04 NOTE — Telephone Encounter (Signed)
error 

## 2019-07-04 NOTE — Telephone Encounter (Signed)
Spoke with Pt's daughter.  Pt does NOT have new onset SOB.  This is chronic related to her heart.  Advised Pt was ok for appt.

## 2019-07-04 NOTE — Progress Notes (Signed)
07/04/2019 Christina Morgan   1933/01/27  [id number]  Primary Physician Seward Carol, MD Primary Cardiologist: Fransico Him, MD  Electrophysiologist: None   Reason for Visit/CC: f/u for LEE and HTN  HPI:  Christina Leiner Johnsonis an 85 y.o.femalewith a hx of paroxysmal atrial fibrillation on Eliquis and flecainide, hypertension, hyperlipidemia, and chronic lower extremity edema. Shealso has a history of chronic diastolic CHF withEchoshowingLVEF 55-60% with elevated ventricular filling pressures and atrial filling pressures, trivial pericardial effusionand moderate aortic insufficiency. She also has chronic lower extremity edema.  She was seen by Melina Copa, PA 06/07/2019 for worsening LE edema and had been treated with antbx for possible cellulitis.  Her lasix was also increased but LE edema persisted. She had been very sedentary and was felt to have a component of chronic venous insuff exacerbated by amlodipine.  Her amlodipine was decreased from 10mg  to 5mg  daily.  Her Lasix was subsequently changed to Demadex 40mg  qam and 2-mg qpm.    She had a telemedicine visit with Dr. Radford Pax on 7/10. She noted slight improvement but still had persistent LEE. This was felt to be multifactorial from chronic venous insuff, sedentary state with her legs hanging down to sit and dietary indiscretion with Na along with possible effect from amlodipine and Megace Dr. Radford Pax, increased Torsemide to 40 mg BID, discontinued amlodipine and started Hydralazine 10 mg BID. Atenolol was continued. She was advised to reduce sodium intake to < 2gm/day and was encouraged to talk to her oncologist about Megace.  Pt instructed to f/u in 2 weeks. She had repeat labs done on 07/02/19. SCr was stable, down from 1.42>>1.32. Na 142. K WNL at 3.5.   She is here today with her daughter. She is doing better. Swelling still present but significantly improved. This is the first time, in a long time, that she has been able to put  on regular shoes (she had been wearing bedroom slippers). She denies resting dyspnea. No orthopnea or PND. She does have stable exertional dyspnea, but relates this to inactivity. Mostly wheel chair bound. Gets up to use the bathroom and get in bed. No improvement in exertional dyspnea with increase in Torsemide. Weight has been stable at home. No weight gain.  Current Meds  Medication Sig  . apixaban (ELIQUIS) 5 MG TABS tablet Take 5 mg by mouth 2 (two) times daily.  Marland Kitchen atenolol (TENORMIN) 50 MG tablet TAKE 1 TABLET BY MOUTH TWICE DAILY  . atorvastatin (LIPITOR) 10 MG tablet Take 10 mg by mouth at bedtime.   . flecainide (TAMBOCOR) 100 MG tablet TAKE 1 TABLET BY MOUTH TWICE DAILY  . hydrALAZINE (APRESOLINE) 10 MG tablet Take 1 tablet (10 mg total) by mouth 2 (two) times a day.  . megestrol (MEGACE) 40 MG tablet Take 40 mg by mouth 2 (two) times daily. Take for 3 weeks , then alternate with tamoxifen 20 mg 1 daily for 3 weeks, etc. - starting medication on 04/14/2019  . potassium chloride SA (K-DUR) 20 MEQ tablet TAKE 1 TABLET BY MOUTH DAILY  . tamoxifen (NOLVADEX) 20 MG tablet Take by mouth. Use as directed  . torsemide (DEMADEX) 20 MG tablet Take 2 tablets (40 mg total) by mouth 2 (two) times daily.   Allergies  Allergen Reactions  . Penicillins Other (See Comments)    Dizziness Has patient had a PCN reaction causing immediate rash, facial/tongue/throat swelling, SOB or lightheadedness with hypotension: No Has patient had a PCN reaction causing severe rash involving mucus membranes or skin  necrosis: No Has patient had a PCN reaction that required hospitalization: No Has patient had a PCN reaction occurring within the last 10 years: No If all of the above answers are "NO", then may proceed with Cephalosporin use.   Past Medical History:  Diagnosis Date  . Cancer [facility])    endometrial ca  . Chronic diastolic CHF (congestive heart failure) ([place])   . Chronic edema   . CKD (chronic kidney  disease), stage III ([place])   . Dyslipidemia   . Fracture of left humerus   . History of cardiovascular stress test    Lexiscan Myoview (06/2014): No ischemia or scar, not gated, low risk  . Hx of echocardiogram    Echo (05/02/14): EF 60% to 65%. No regional wall motion abnormalities. Mild AI, mildly dilated aortic root (37 mm), trivial MR, trivial TR  . Hyperlipidemia   . Hypertension   . Lipoma of skin    multiple  . Osteopenia   . PAF (paroxysmal atrial fibrillation) ([place])    failed DCCV 05/2014 >> Flecainide started >> DCCV 7/15 sucessful;  f/u ETT neg for pro-arrhythmia >> recurrent AF/AFL >> Flecainide inc to 100 bid with repeat DCCV 9/15   Family History  Problem Relation Age of Onset  . Cancer Father 53       metastatic oropharyngeal ca  . Heart attack Mother   . Coronary artery disease Mother    Past Surgical History:  Procedure Laterality Date  . CARDIOVERSION N/A 06/05/2014   Procedure: CARDIOVERSION;  Surgeon: Sueanne Margarita, MD;  Location: [facility];  Service: Cardiovascular;  Laterality: N/A;  . CARDIOVERSION N/A 07/11/2014   Procedure: CARDIOVERSION;  Surgeon: Sueanne Margarita, MD;  Location[facility] ENDOSCOPY;  Service: Cardiovascular;  Laterality: N/A;  . CARDIOVERSION N/A 09/04/2014   Procedure: CARDIOVERSION;  Surgeon: Sueanne Margarita, MD;  Location: [facility] ENDOSCOPY;  Service: Cardiovascular;  Laterality: N/A;  . COLONOSCOPY  2008  . COLONOSCOPY N/A 05/20/2015   Procedure: COLONOSCOPY;  Surgeon: Ronald Lobo, MD;  Location: [facility] ENDOSCOPY;  Service: Endoscopy;  Laterality: N/A;  . IR KYPHO EA ADDL LEVEL THORACIC OR LUMBAR  04/12/2019  . IR KYPHO THORACIC WITH BONE BIOPSY  04/12/2019  . ROBOTIC ASSISTED SUPRACERVICAL HYSTERECTOMY WITH BILATERAL SALPINGO OOPHERECTOMY  11/06/2012   and bilateral pelvic lymph node dissection   Social History   Socioeconomic History  . Marital status: Widowed    Spouse name: Not on file  . Number of children: Not on file  . Years of education: Not  on file  . Highest education level: Not on file  Occupational History  . Not on file  Social Needs  . Financial resource strain: Not on file  . Food insecurity    Worry: Not on file    Inability: Not on file  . Transportation needs    Medical: Not on file    Non-medical: Not on file  Tobacco Use  . Smoking status: Never Smoker  . Smokeless tobacco: Never Used  Substance and Sexual Activity  . Alcohol use: No  . Drug use: No  . Sexual activity: Not on file  Lifestyle  . Physical activity    Days per week: Not on file    Minutes per session: Not on file  . Stress: Not on file  Relationships  . Social Herbalist on phone: Not on file    Gets together: Not on file    Attends religious service: Not on file  Active member of club or organization: Not on file    Attends meetings of clubs or organizations: Not on file    Relationship status: Not on file  . Intimate partner violence    Fear of current or ex partner: Not on file    Emotionally abused: Not on file    Physically abused: Not on file    Forced sexual activity: Not on file  Other Topics Concern  . Not on file  Social History Narrative  . Not on file     Lipid Panel     Component Value Date/Time   CHOL 175 03/21/2016 0915   TRIG 155 (H) 03/21/2016 0915   HDL 63 03/21/2016 0915   CHOLHDL 2.8 03/21/2016 0915   VLDL 31 (H) 03/21/2016 0915   LDLCALC 81 03/21/2016 0915    Review of Systems: General: negative for chills, fever, night sweats or weight changes.  Cardiovascular: negative for chest pain, dyspnea on exertion, edema, orthopnea, palpitations, paroxysmal nocturnal dyspnea or shortness of breath Dermatological: negative for rash Respiratory: negative for cough or wheezing Urologic: negative for hematuria Abdominal: negative for nausea, vomiting, diarrhea, bright red blood per rectum, melena, or hematemesis Neurologic: negative for visual changes, syncope, or dizziness All other systems  reviewed and are otherwise negative except as noted above.   Physical Exam:  Blood pressure 122/62, pulse 62, height 5\' 5"  (1.651 m), weight 176 lb 3.2 oz (79.9 kg), SpO2 97 %.  General appearance: alert, cooperative, no distress and elderly WF in wheelchair Neck: no carotid bruit and no JVD Lungs: clear to auscultation bilaterally Heart: regular rate and rhythm Extremities: 2+ bilateral LEE pitting edema Pulses: 2+ and symmetric Skin: Skin color, texture, turgor normal. No rashes or lesions Neurologic: Grossly normal  EKG EKG is of poor quality (due to tremor), difficult to see p waves but rhythm appears regular. 63 bpm  -- personally reviewed   ASSESSMENT AND PLAN:   1. LEE: felt to be multifactorial from chronic venous insuff, sedentary state with her legs hanging down to sit and dietary indiscretion with Na along with possible effect from amlodipine and Megace. Several med changes recently. Torsemide increased to 40 mg BID and amlodipine discontinued. F/u BMP after diuretic dose adjustment 7/21 showed SCr was stable, down from 1.42>>1.32. Na 142. K WNL at 3.5. She is doing better. Swelling still present but significantly improved. This is the first time, in a long time, that she has been able to put on regular shoes (she had been wearing bedroom slippers). She will check with oncologist regarding Megace as swelling is also a side effect of med. I encouraged low salt diet, elevation when sitting and use of compression stockings.    2. Chronic Diastolic HF: tolerating higher dose of torsemide ok. F/u labs on 7/21 showed stable SCr, Na and K, all WNL. LEE improved but still present (see above). She denies resting dyspnea. No orthopnea or PND. She does have stable exertional dyspnea, but relates this to inactivity. Mostly wheel chair bound. Gets up to use the bathroom and get in bed. No improvement in exertional dyspnea with increase in Torsemide. Weight has been stable at home. She will notify  us if any change in respiratory status (resting dyspnea, orthopnea or PND). For now continue current dose of diuretic. We discussed daily weights and low sodium diet. She will call if > 3 lb wt gain in 24 hr or > 5 lb in 1 week.   3. PAF: on Flecainide, atenolol and  Eliquis. EKG is of poor quality (due to tremor), difficult to see p waves but rhythm appears regular. HR 63 bpm.   4. HTN: recent med changes. Amlodipine discontinued 7/10 due to LEE. Hydralazine added, 10 mg BID. Torsemide increased to 40 mg BID. Also on atenolol 50 BID. Her BP is well controlled today at 122/62. She is tolerating new meds well w/o side effects. Will continue current regimen.    Follow-Up w/ Dr. Radford Pax in 6 months.   Candela Krul Ladoris Gene, MHS [facility] 07/04/2019 1:52 PM

## 2019-07-04 NOTE — Telephone Encounter (Signed)
New Message         COVID-19 Pre-Screening Questions:   In the past 7 to 10 days have you had a cough,  shortness of breath, headache, congestion, fever (100 or greater) body aches, chills, sore throat, or sudden loss of taste or sense of smell? SOB due to heart issues   Have you been around anyone with known Covid 19. NO  Have you been around anyone who is awaiting Covid 19 test results in the past 7 to 10 days? NO  Have you been around anyone who has been exposed to Covid 19, or has mentioned symptoms of Covid 19 within the past 7 to 10 days? NO Pts daughter is bringing her because the pt is in a wheel chair. She answered NO to all screening questions   If you have any concerns/questions about symptoms patients report during screening (either on the phone or at threshold). Contact the provider seeing the patient or DOD for further guidance.  If neither are available contact a member of the leadership team.

## 2019-07-04 NOTE — Patient Instructions (Signed)
Medication Instructions:  none If you need a refill on your cardiac medications before your next appointment, please call your pharmacy.   Lab work: none If you have labs (blood work) drawn today and your tests are completely normal, you will receive your results only by: Marland Kitchen MyChart Message (if you have MyChart) OR . A paper copy in the mail If you have any lab test that is abnormal or we need to change your treatment, we will call you to review the results.  Testing/Procedures: none  Follow-Up: At [facility], you and your health needs are our priority.  As part of our continuing mission to provide you with exceptional heart care, we have created designated Provider Care Teams.  These Care Teams include your primary Cardiologist (physician) and Advanced Practice Providers (APPs -  Physician Assistants and Nurse Practitioners) who all work together to provide you with the care you need, when you need it. You will need a follow up appointment in 6 months.  Please call our office 2 months in advance to schedule this appointment.  You may see Fransico Him, MD or one of the following Advanced Practice Providers on your designated Care Team:   [place], PA-C Melina Copa, PA-C . Ermalinda Barrios, PA-C  Any Other Special Instructions Will Be Listed Below (If Applicable). WEIGH DAILY:  If you gain more than 3 lbs in 1 day or 5 lbs in 1 week, call the office.  DASH Eating Plan DASH stands for "Dietary Approaches to Stop Hypertension." The DASH eating plan is a healthy eating plan that has been shown to reduce high blood pressure (hypertension). It may also reduce your risk for type 2 diabetes, heart disease, and stroke. The DASH eating plan may also help with weight loss. What are tips for following this plan?  General guidelines  Avoid eating more than 2,300 mg (milligrams) of salt (sodium) a day. If you have hypertension, you may need to reduce your sodium intake to 1,500 mg a day.   Limit alcohol intake to no more than 1 drink a day for nonpregnant women and 2 drinks a day for men. One drink equals 12 oz of beer, 5 oz of wine, or 1 oz of hard liquor.  Work with your health care provider to maintain a healthy body weight or to lose weight. Ask what an ideal weight is for you.  Get at least 30 minutes of exercise that causes your heart to beat faster (aerobic exercise) most days of the week. Activities may include walking, swimming, or biking.  Work with your health care provider or diet and nutrition specialist (dietitian) to adjust your eating plan to your individual calorie needs. Reading food labels   Check food labels for the amount of sodium per serving. Choose foods with less than 5 percent of the Daily Value of sodium. Generally, foods with less than 300 mg of sodium per serving fit into this eating plan.  To find whole grains, look for the word "whole" as the first word in the ingredient list. Shopping  Buy products labeled as "low-sodium" or "no salt added."  Buy fresh foods. Avoid canned foods and premade or frozen meals. Cooking  Avoid adding salt when cooking. Use salt-free seasonings or herbs instead of table salt or sea salt. Check with your health care provider or pharmacist before using salt substitutes.  Do not fry foods. Cook foods using healthy methods such as baking, boiling, grilling, and broiling instead.  Cook with heart-healthy oils, such  as olive, canola, soybean, or sunflower oil. Meal planning  Eat a balanced diet that includes: ? 5 or more servings of fruits and vegetables each day. At each meal, try to fill half of your plate with fruits and vegetables. ? Up to 6-8 servings of whole grains each day. ? Less than 6 oz of lean meat, poultry, or fish each day. A 3-oz serving of meat is about the same size as a deck of cards. One egg equals 1 oz. ? 2 servings of low-fat dairy each day. ? A serving of nuts, seeds, or beans 5 times each  week. ? Heart-healthy fats. Healthy fats called Omega-3 fatty acids are found in foods such as flaxseeds and coldwater fish, like sardines, salmon, and mackerel.  Limit how much you eat of the following: ? Canned or prepackaged foods. ? Food that is high in trans fat, such as fried foods. ? Food that is high in saturated fat, such as fatty meat. ? Sweets, desserts, sugary drinks, and other foods with added sugar. ? Full-fat dairy products.  Do not salt foods before eating.  Try to eat at least 2 vegetarian meals each week.  Eat more home-cooked food and less restaurant, buffet, and fast food.  When eating at a restaurant, ask that your food be prepared with less salt or no salt, if possible. What foods are recommended? The items listed may not be a complete list. Talk with your dietitian about what dietary choices are best for you. Grains Whole-grain or whole-wheat bread. Whole-grain or whole-wheat pasta. Brown rice. Modena Morrow. Bulgur. Whole-grain and low-sodium cereals. Pita bread. Low-fat, low-sodium crackers. Whole-wheat flour tortillas. Vegetables Fresh or frozen vegetables (raw, steamed, roasted, or grilled). Low-sodium or reduced-sodium tomato and vegetable juice. Low-sodium or reduced-sodium tomato sauce and tomato paste. Low-sodium or reduced-sodium canned vegetables. Fruits All fresh, dried, or frozen fruit. Canned fruit in natural juice (without added sugar). Meat and other protein foods Skinless chicken or [place]. Ground chicken or [place]. Pork with fat trimmed off. Fish and seafood. Egg whites. Dried beans, peas, or lentils. Unsalted nuts, nut butters, and seeds. Unsalted canned beans. Lean cuts of beef with fat trimmed off. Low-sodium, lean deli meat. Dairy Low-fat (1%) or fat-free (skim) milk. Fat-free, low-fat, or reduced-fat cheeses. Nonfat, low-sodium ricotta or cottage cheese. Low-fat or nonfat yogurt. Low-fat, low-sodium cheese. Fats and oils Soft margarine  without trans fats. Vegetable oil. Low-fat, reduced-fat, or light mayonnaise and salad dressings (reduced-sodium). Canola, safflower, olive, soybean, and sunflower oils. Avocado. Seasoning and other foods Herbs. Spices. Seasoning mixes without salt. Unsalted popcorn and pretzels. Fat-free sweets. What foods are not recommended? The items listed may not be a complete list. Talk with your dietitian about what dietary choices are best for you. Grains Baked goods made with fat, such as croissants, muffins, or some breads. Dry pasta or rice meal packs. Vegetables Creamed or fried vegetables. Vegetables in a cheese sauce. Regular canned vegetables (not low-sodium or reduced-sodium). Regular canned tomato sauce and paste (not low-sodium or reduced-sodium). Regular tomato and vegetable juice (not low-sodium or reduced-sodium). Angie Fava. Olives. Fruits Canned fruit in a light or heavy syrup. Fried fruit. Fruit in cream or butter sauce. Meat and other protein foods Fatty cuts of meat. Ribs. Fried meat. Berniece Salines. Sausage. Bologna and other processed lunch meats. Salami. Fatback. Hotdogs. Bratwurst. Salted nuts and seeds. Canned beans with added salt. Canned or smoked fish. Whole eggs or egg yolks. Chicken or [place] with skin. Dairy Whole or 2% milk, cream, and  half-and-half. Whole or full-fat cream cheese. Whole-fat or sweetened yogurt. Full-fat cheese. Nondairy creamers. Whipped toppings. Processed cheese and cheese spreads. Fats and oils Butter. Stick margarine. Lard. Shortening. Ghee. Bacon fat. Tropical oils, such as coconut, palm kernel, or palm oil. Seasoning and other foods Salted popcorn and pretzels. Onion salt, garlic salt, seasoned salt, table salt, and sea salt. Worcestershire sauce. Tartar sauce. Barbecue sauce. Teriyaki sauce. Soy sauce, including reduced-sodium. Steak sauce. Canned and packaged gravies. Fish sauce. Oyster sauce. Cocktail sauce. Horseradish that you find on the shelf. Ketchup.  Mustard. Meat flavorings and tenderizers. Bouillon cubes. Hot sauce and Tabasco sauce. Premade or packaged marinades. Premade or packaged taco seasonings. Relishes. Regular salad dressings. Where to find more information:  National Heart, Lung, and [place]: https://wilson-eaton.com/  American Heart Association: www.heart.org Summary  The DASH eating plan is a healthy eating plan that has been shown to reduce high blood pressure (hypertension). It may also reduce your risk for type 2 diabetes, heart disease, and stroke.  With the DASH eating plan, you should limit salt (sodium) intake to 2,300 mg a day. If you have hypertension, you may need to reduce your sodium intake to 1,500 mg a day.  When on the DASH eating plan, aim to eat more fresh fruits and vegetables, whole grains, lean proteins, low-fat dairy, and heart-healthy fats.  Work with your health care provider or diet and nutrition specialist (dietitian) to adjust your eating plan to your individual calorie needs. This information is not intended to replace advice given to you by your health care provider. Make sure you discuss any questions you have with your health care provider. Document Released: 11/17/2011 Document Revised: 11/10/2017 Document Reviewed: 11/21/2016 Elsevier Patient Education  2020 [employer].

## 2019-07-05 DIAGNOSIS — I5032 Chronic diastolic (congestive) heart failure: Secondary | ICD-10-CM | POA: Diagnosis not present

## 2019-07-05 DIAGNOSIS — I4891 Unspecified atrial fibrillation: Secondary | ICD-10-CM | POA: Diagnosis not present

## 2019-07-05 DIAGNOSIS — Z79810 Long term (current) use of selective estrogen receptor modulators (SERMs): Secondary | ICD-10-CM | POA: Diagnosis not present

## 2019-07-05 DIAGNOSIS — I10 Essential (primary) hypertension: Secondary | ICD-10-CM | POA: Diagnosis not present

## 2019-07-05 DIAGNOSIS — C55 Malignant neoplasm of uterus, part unspecified: Secondary | ICD-10-CM | POA: Diagnosis not present

## 2019-07-05 DIAGNOSIS — L89152 Pressure ulcer of sacral region, stage 2: Secondary | ICD-10-CM | POA: Diagnosis not present

## 2019-07-08 ENCOUNTER — Telehealth: Payer: Self-pay

## 2019-07-08 DIAGNOSIS — N183 Chronic kidney disease, stage 3 unspecified: Secondary | ICD-10-CM

## 2019-07-08 DIAGNOSIS — I10 Essential (primary) hypertension: Secondary | ICD-10-CM | POA: Diagnosis not present

## 2019-07-08 DIAGNOSIS — Z79810 Long term (current) use of selective estrogen receptor modulators (SERMs): Secondary | ICD-10-CM | POA: Diagnosis not present

## 2019-07-08 DIAGNOSIS — L89152 Pressure ulcer of sacral region, stage 2: Secondary | ICD-10-CM | POA: Diagnosis not present

## 2019-07-08 DIAGNOSIS — I7121 Aneurysm of the ascending aorta, without rupture: Secondary | ICD-10-CM

## 2019-07-08 DIAGNOSIS — C55 Malignant neoplasm of uterus, part unspecified: Secondary | ICD-10-CM | POA: Diagnosis not present

## 2019-07-08 DIAGNOSIS — I5032 Chronic diastolic (congestive) heart failure: Secondary | ICD-10-CM | POA: Diagnosis not present

## 2019-07-08 DIAGNOSIS — I4891 Unspecified atrial fibrillation: Secondary | ICD-10-CM | POA: Diagnosis not present

## 2019-07-08 DIAGNOSIS — I712 Thoracic aortic aneurysm, without rupture: Secondary | ICD-10-CM

## 2019-07-08 NOTE — Telephone Encounter (Signed)
Phoned pt and spoke with dtr, Duaine Dredge (per DPR). Instructed to increase K-Dur dose to 20 meq twice daily and to have repeat bloodwork drawn in 1 week. She verbalized understanding, no further questions or concerns.

## 2019-07-08 NOTE — Telephone Encounter (Signed)
Patient dropped off blood pressure readings for Dr. Radford Pax to review. Dr. Radford Pax has reviewed them and stated that blood pressures look good and no changes. I called and spoke with patients daughter Christina Morgan, ok per patient DPR. Christina Morgan is aware that per Dr. Radford Pax that blood pressures look good and to continue current medications.

## 2019-07-08 NOTE — Telephone Encounter (Signed)
I called and spoke with patients daughter about blood pressure readings that were dropped off. Her daughter mentioned that she needs repeat blood work in a week. Patient has a home health agency; [facility]. [facility] can draw patients blood [facility] week, they just need an order. If you will call and set that up with them.

## 2019-07-09 DIAGNOSIS — I4891 Unspecified atrial fibrillation: Secondary | ICD-10-CM | POA: Diagnosis not present

## 2019-07-09 DIAGNOSIS — I10 Essential (primary) hypertension: Secondary | ICD-10-CM | POA: Diagnosis not present

## 2019-07-09 DIAGNOSIS — E7800 Pure hypercholesterolemia, unspecified: Secondary | ICD-10-CM | POA: Diagnosis not present

## 2019-07-09 DIAGNOSIS — R269 Unspecified abnormalities of gait and mobility: Secondary | ICD-10-CM | POA: Diagnosis not present

## 2019-07-09 DIAGNOSIS — S22080D Wedge compression fracture of T11-T12 vertebra, subsequent encounter for fracture with routine healing: Secondary | ICD-10-CM | POA: Diagnosis not present

## 2019-07-09 NOTE — Telephone Encounter (Signed)
Called [facility] [phone].  Spoke with Elroy Channel, RN.  V/o given for BMET in one week and adv on change in potassium dose. Results to be faxed to Dr. Radford Pax at [phone].

## 2019-07-09 NOTE — Telephone Encounter (Signed)
Please set up blood work per last lab note

## 2019-07-10 DIAGNOSIS — C55 Malignant neoplasm of uterus, part unspecified: Secondary | ICD-10-CM | POA: Diagnosis not present

## 2019-07-10 DIAGNOSIS — L89152 Pressure ulcer of sacral region, stage 2: Secondary | ICD-10-CM | POA: Diagnosis not present

## 2019-07-10 DIAGNOSIS — Z79810 Long term (current) use of selective estrogen receptor modulators (SERMs): Secondary | ICD-10-CM | POA: Diagnosis not present

## 2019-07-10 DIAGNOSIS — I10 Essential (primary) hypertension: Secondary | ICD-10-CM | POA: Diagnosis not present

## 2019-07-10 DIAGNOSIS — I4891 Unspecified atrial fibrillation: Secondary | ICD-10-CM | POA: Diagnosis not present

## 2019-07-10 DIAGNOSIS — I5032 Chronic diastolic (congestive) heart failure: Secondary | ICD-10-CM | POA: Diagnosis not present

## 2019-07-12 DIAGNOSIS — I5032 Chronic diastolic (congestive) heart failure: Secondary | ICD-10-CM | POA: Diagnosis not present

## 2019-07-12 DIAGNOSIS — Z79810 Long term (current) use of selective estrogen receptor modulators (SERMs): Secondary | ICD-10-CM | POA: Diagnosis not present

## 2019-07-12 DIAGNOSIS — C55 Malignant neoplasm of uterus, part unspecified: Secondary | ICD-10-CM | POA: Diagnosis not present

## 2019-07-12 DIAGNOSIS — L89152 Pressure ulcer of sacral region, stage 2: Secondary | ICD-10-CM | POA: Diagnosis not present

## 2019-07-12 DIAGNOSIS — I10 Essential (primary) hypertension: Secondary | ICD-10-CM | POA: Diagnosis not present

## 2019-07-12 DIAGNOSIS — I4891 Unspecified atrial fibrillation: Secondary | ICD-10-CM | POA: Diagnosis not present

## 2019-07-15 DIAGNOSIS — Z79810 Long term (current) use of selective estrogen receptor modulators (SERMs): Secondary | ICD-10-CM | POA: Diagnosis not present

## 2019-07-15 DIAGNOSIS — I4891 Unspecified atrial fibrillation: Secondary | ICD-10-CM | POA: Diagnosis not present

## 2019-07-15 DIAGNOSIS — I5032 Chronic diastolic (congestive) heart failure: Secondary | ICD-10-CM | POA: Diagnosis not present

## 2019-07-15 DIAGNOSIS — I10 Essential (primary) hypertension: Secondary | ICD-10-CM | POA: Diagnosis not present

## 2019-07-15 DIAGNOSIS — L89152 Pressure ulcer of sacral region, stage 2: Secondary | ICD-10-CM | POA: Diagnosis not present

## 2019-07-15 DIAGNOSIS — C55 Malignant neoplasm of uterus, part unspecified: Secondary | ICD-10-CM | POA: Diagnosis not present

## 2019-07-16 DIAGNOSIS — I10 Essential (primary) hypertension: Secondary | ICD-10-CM | POA: Diagnosis not present

## 2019-07-16 DIAGNOSIS — I4891 Unspecified atrial fibrillation: Secondary | ICD-10-CM | POA: Diagnosis not present

## 2019-07-16 DIAGNOSIS — Z79810 Long term (current) use of selective estrogen receptor modulators (SERMs): Secondary | ICD-10-CM | POA: Diagnosis not present

## 2019-07-16 DIAGNOSIS — R7989 Other specified abnormal findings of blood chemistry: Secondary | ICD-10-CM | POA: Diagnosis not present

## 2019-07-16 DIAGNOSIS — I5032 Chronic diastolic (congestive) heart failure: Secondary | ICD-10-CM | POA: Diagnosis not present

## 2019-07-16 DIAGNOSIS — L89152 Pressure ulcer of sacral region, stage 2: Secondary | ICD-10-CM | POA: Diagnosis not present

## 2019-07-16 DIAGNOSIS — C55 Malignant neoplasm of uterus, part unspecified: Secondary | ICD-10-CM | POA: Diagnosis not present

## 2019-07-17 DIAGNOSIS — I4891 Unspecified atrial fibrillation: Secondary | ICD-10-CM | POA: Diagnosis not present

## 2019-07-17 DIAGNOSIS — L89152 Pressure ulcer of sacral region, stage 2: Secondary | ICD-10-CM | POA: Diagnosis not present

## 2019-07-17 DIAGNOSIS — Z79810 Long term (current) use of selective estrogen receptor modulators (SERMs): Secondary | ICD-10-CM | POA: Diagnosis not present

## 2019-07-17 DIAGNOSIS — C55 Malignant neoplasm of uterus, part unspecified: Secondary | ICD-10-CM | POA: Diagnosis not present

## 2019-07-17 DIAGNOSIS — I5032 Chronic diastolic (congestive) heart failure: Secondary | ICD-10-CM | POA: Diagnosis not present

## 2019-07-17 DIAGNOSIS — I10 Essential (primary) hypertension: Secondary | ICD-10-CM | POA: Diagnosis not present

## 2019-07-19 DIAGNOSIS — Z79810 Long term (current) use of selective estrogen receptor modulators (SERMs): Secondary | ICD-10-CM | POA: Diagnosis not present

## 2019-07-19 DIAGNOSIS — C55 Malignant neoplasm of uterus, part unspecified: Secondary | ICD-10-CM | POA: Diagnosis not present

## 2019-07-19 DIAGNOSIS — I5032 Chronic diastolic (congestive) heart failure: Secondary | ICD-10-CM | POA: Diagnosis not present

## 2019-07-19 DIAGNOSIS — I10 Essential (primary) hypertension: Secondary | ICD-10-CM | POA: Diagnosis not present

## 2019-07-19 DIAGNOSIS — L89152 Pressure ulcer of sacral region, stage 2: Secondary | ICD-10-CM | POA: Diagnosis not present

## 2019-07-19 DIAGNOSIS — I4891 Unspecified atrial fibrillation: Secondary | ICD-10-CM | POA: Diagnosis not present

## 2019-07-22 DIAGNOSIS — Z79810 Long term (current) use of selective estrogen receptor modulators (SERMs): Secondary | ICD-10-CM | POA: Diagnosis not present

## 2019-07-22 DIAGNOSIS — I10 Essential (primary) hypertension: Secondary | ICD-10-CM | POA: Diagnosis not present

## 2019-07-22 DIAGNOSIS — I5032 Chronic diastolic (congestive) heart failure: Secondary | ICD-10-CM | POA: Diagnosis not present

## 2019-07-22 DIAGNOSIS — L89152 Pressure ulcer of sacral region, stage 2: Secondary | ICD-10-CM | POA: Diagnosis not present

## 2019-07-22 DIAGNOSIS — C55 Malignant neoplasm of uterus, part unspecified: Secondary | ICD-10-CM | POA: Diagnosis not present

## 2019-07-22 DIAGNOSIS — I4891 Unspecified atrial fibrillation: Secondary | ICD-10-CM | POA: Diagnosis not present

## 2019-07-23 DIAGNOSIS — L89152 Pressure ulcer of sacral region, stage 2: Secondary | ICD-10-CM | POA: Diagnosis not present

## 2019-07-23 DIAGNOSIS — I5032 Chronic diastolic (congestive) heart failure: Secondary | ICD-10-CM | POA: Diagnosis not present

## 2019-07-23 DIAGNOSIS — I10 Essential (primary) hypertension: Secondary | ICD-10-CM | POA: Diagnosis not present

## 2019-07-23 DIAGNOSIS — C55 Malignant neoplasm of uterus, part unspecified: Secondary | ICD-10-CM | POA: Diagnosis not present

## 2019-07-23 DIAGNOSIS — I4891 Unspecified atrial fibrillation: Secondary | ICD-10-CM | POA: Diagnosis not present

## 2019-07-23 DIAGNOSIS — Z79810 Long term (current) use of selective estrogen receptor modulators (SERMs): Secondary | ICD-10-CM | POA: Diagnosis not present

## 2019-07-24 DIAGNOSIS — L89152 Pressure ulcer of sacral region, stage 2: Secondary | ICD-10-CM | POA: Diagnosis not present

## 2019-07-24 DIAGNOSIS — C55 Malignant neoplasm of uterus, part unspecified: Secondary | ICD-10-CM | POA: Diagnosis not present

## 2019-07-24 DIAGNOSIS — I10 Essential (primary) hypertension: Secondary | ICD-10-CM | POA: Diagnosis not present

## 2019-07-24 DIAGNOSIS — I5032 Chronic diastolic (congestive) heart failure: Secondary | ICD-10-CM | POA: Diagnosis not present

## 2019-07-24 DIAGNOSIS — I4891 Unspecified atrial fibrillation: Secondary | ICD-10-CM | POA: Diagnosis not present

## 2019-07-24 DIAGNOSIS — Z79810 Long term (current) use of selective estrogen receptor modulators (SERMs): Secondary | ICD-10-CM | POA: Diagnosis not present

## 2019-07-26 DIAGNOSIS — I4891 Unspecified atrial fibrillation: Secondary | ICD-10-CM | POA: Diagnosis not present

## 2019-07-26 DIAGNOSIS — G609 Hereditary and idiopathic neuropathy, unspecified: Secondary | ICD-10-CM | POA: Diagnosis not present

## 2019-07-26 DIAGNOSIS — I10 Essential (primary) hypertension: Secondary | ICD-10-CM | POA: Diagnosis not present

## 2019-07-26 DIAGNOSIS — C55 Malignant neoplasm of uterus, part unspecified: Secondary | ICD-10-CM | POA: Diagnosis not present

## 2019-07-26 DIAGNOSIS — Z79810 Long term (current) use of selective estrogen receptor modulators (SERMs): Secondary | ICD-10-CM | POA: Diagnosis not present

## 2019-07-26 DIAGNOSIS — L89152 Pressure ulcer of sacral region, stage 2: Secondary | ICD-10-CM | POA: Diagnosis not present

## 2019-07-26 DIAGNOSIS — I5032 Chronic diastolic (congestive) heart failure: Secondary | ICD-10-CM | POA: Diagnosis not present

## 2019-07-26 DIAGNOSIS — Z8781 Personal history of (healed) traumatic fracture: Secondary | ICD-10-CM | POA: Diagnosis not present

## 2019-07-29 DIAGNOSIS — I10 Essential (primary) hypertension: Secondary | ICD-10-CM | POA: Diagnosis not present

## 2019-07-29 DIAGNOSIS — C55 Malignant neoplasm of uterus, part unspecified: Secondary | ICD-10-CM | POA: Diagnosis not present

## 2019-07-29 DIAGNOSIS — I4891 Unspecified atrial fibrillation: Secondary | ICD-10-CM | POA: Diagnosis not present

## 2019-07-29 DIAGNOSIS — Z79810 Long term (current) use of selective estrogen receptor modulators (SERMs): Secondary | ICD-10-CM | POA: Diagnosis not present

## 2019-07-29 DIAGNOSIS — L89152 Pressure ulcer of sacral region, stage 2: Secondary | ICD-10-CM | POA: Diagnosis not present

## 2019-07-29 DIAGNOSIS — I5032 Chronic diastolic (congestive) heart failure: Secondary | ICD-10-CM | POA: Diagnosis not present

## 2019-07-30 DIAGNOSIS — I4891 Unspecified atrial fibrillation: Secondary | ICD-10-CM | POA: Diagnosis not present

## 2019-07-30 DIAGNOSIS — I5032 Chronic diastolic (congestive) heart failure: Secondary | ICD-10-CM | POA: Diagnosis not present

## 2019-07-30 DIAGNOSIS — L89152 Pressure ulcer of sacral region, stage 2: Secondary | ICD-10-CM | POA: Diagnosis not present

## 2019-07-30 DIAGNOSIS — C55 Malignant neoplasm of uterus, part unspecified: Secondary | ICD-10-CM | POA: Diagnosis not present

## 2019-07-30 DIAGNOSIS — I10 Essential (primary) hypertension: Secondary | ICD-10-CM | POA: Diagnosis not present

## 2019-07-30 DIAGNOSIS — Z79810 Long term (current) use of selective estrogen receptor modulators (SERMs): Secondary | ICD-10-CM | POA: Diagnosis not present

## 2019-07-31 DIAGNOSIS — C55 Malignant neoplasm of uterus, part unspecified: Secondary | ICD-10-CM | POA: Diagnosis not present

## 2019-07-31 DIAGNOSIS — Z79810 Long term (current) use of selective estrogen receptor modulators (SERMs): Secondary | ICD-10-CM | POA: Diagnosis not present

## 2019-07-31 DIAGNOSIS — I10 Essential (primary) hypertension: Secondary | ICD-10-CM | POA: Diagnosis not present

## 2019-07-31 DIAGNOSIS — L89152 Pressure ulcer of sacral region, stage 2: Secondary | ICD-10-CM | POA: Diagnosis not present

## 2019-07-31 DIAGNOSIS — I4891 Unspecified atrial fibrillation: Secondary | ICD-10-CM | POA: Diagnosis not present

## 2019-07-31 DIAGNOSIS — I5032 Chronic diastolic (congestive) heart failure: Secondary | ICD-10-CM | POA: Diagnosis not present

## 2019-08-06 DIAGNOSIS — I10 Essential (primary) hypertension: Secondary | ICD-10-CM | POA: Diagnosis not present

## 2019-08-06 DIAGNOSIS — C55 Malignant neoplasm of uterus, part unspecified: Secondary | ICD-10-CM | POA: Diagnosis not present

## 2019-08-06 DIAGNOSIS — I5032 Chronic diastolic (congestive) heart failure: Secondary | ICD-10-CM | POA: Diagnosis not present

## 2019-08-06 DIAGNOSIS — Z79810 Long term (current) use of selective estrogen receptor modulators (SERMs): Secondary | ICD-10-CM | POA: Diagnosis not present

## 2019-08-06 DIAGNOSIS — L89152 Pressure ulcer of sacral region, stage 2: Secondary | ICD-10-CM | POA: Diagnosis not present

## 2019-08-06 DIAGNOSIS — I4891 Unspecified atrial fibrillation: Secondary | ICD-10-CM | POA: Diagnosis not present

## 2019-08-07 DIAGNOSIS — L89152 Pressure ulcer of sacral region, stage 2: Secondary | ICD-10-CM | POA: Diagnosis not present

## 2019-08-07 DIAGNOSIS — Z79810 Long term (current) use of selective estrogen receptor modulators (SERMs): Secondary | ICD-10-CM | POA: Diagnosis not present

## 2019-08-07 DIAGNOSIS — I4891 Unspecified atrial fibrillation: Secondary | ICD-10-CM | POA: Diagnosis not present

## 2019-08-07 DIAGNOSIS — C55 Malignant neoplasm of uterus, part unspecified: Secondary | ICD-10-CM | POA: Diagnosis not present

## 2019-08-07 DIAGNOSIS — I5032 Chronic diastolic (congestive) heart failure: Secondary | ICD-10-CM | POA: Diagnosis not present

## 2019-08-07 DIAGNOSIS — I10 Essential (primary) hypertension: Secondary | ICD-10-CM | POA: Diagnosis not present

## 2019-08-08 ENCOUNTER — Other Ambulatory Visit: Payer: Self-pay | Admitting: Cardiology

## 2019-08-08 DIAGNOSIS — C55 Malignant neoplasm of uterus, part unspecified: Secondary | ICD-10-CM | POA: Diagnosis not present

## 2019-08-08 DIAGNOSIS — I5032 Chronic diastolic (congestive) heart failure: Secondary | ICD-10-CM | POA: Diagnosis not present

## 2019-08-08 DIAGNOSIS — L89152 Pressure ulcer of sacral region, stage 2: Secondary | ICD-10-CM | POA: Diagnosis not present

## 2019-08-08 DIAGNOSIS — Z79810 Long term (current) use of selective estrogen receptor modulators (SERMs): Secondary | ICD-10-CM | POA: Diagnosis not present

## 2019-08-08 DIAGNOSIS — I10 Essential (primary) hypertension: Secondary | ICD-10-CM | POA: Diagnosis not present

## 2019-08-08 DIAGNOSIS — I4891 Unspecified atrial fibrillation: Secondary | ICD-10-CM | POA: Diagnosis not present

## 2019-08-08 MED ORDER — POTASSIUM CHLORIDE CRYS ER 20 MEQ PO TBCR: 20.0000 meq | EXTENDED_RELEASE_TABLET | Freq: Two times a day (BID) | ORAL | 3 refills | Status: DC

## 2019-08-08 NOTE — Telephone Encounter (Signed)
Pt's medication was increased per Dr. Radford Pax but it was not sent in to pt's pharmacy per Dr. Ceasar Mons, Notes recorded by Sueanne Margarita, MD on 07/05/2019 at 5:20 PM EDT  Increase Kdur to 36meq BID and repeat BMET in 1 week . Medication sent in to pt's pharmacy as requested. Confirmation received.

## 2019-08-13 DIAGNOSIS — C55 Malignant neoplasm of uterus, part unspecified: Secondary | ICD-10-CM | POA: Diagnosis not present

## 2019-08-13 DIAGNOSIS — I4891 Unspecified atrial fibrillation: Secondary | ICD-10-CM | POA: Diagnosis not present

## 2019-08-13 DIAGNOSIS — Z79810 Long term (current) use of selective estrogen receptor modulators (SERMs): Secondary | ICD-10-CM | POA: Diagnosis not present

## 2019-08-13 DIAGNOSIS — I5032 Chronic diastolic (congestive) heart failure: Secondary | ICD-10-CM | POA: Diagnosis not present

## 2019-08-13 DIAGNOSIS — I10 Essential (primary) hypertension: Secondary | ICD-10-CM | POA: Diagnosis not present

## 2019-08-13 DIAGNOSIS — L89152 Pressure ulcer of sacral region, stage 2: Secondary | ICD-10-CM | POA: Diagnosis not present

## 2019-08-14 DIAGNOSIS — C55 Malignant neoplasm of uterus, part unspecified: Secondary | ICD-10-CM | POA: Diagnosis not present

## 2019-08-14 DIAGNOSIS — I10 Essential (primary) hypertension: Secondary | ICD-10-CM | POA: Diagnosis not present

## 2019-08-14 DIAGNOSIS — L89152 Pressure ulcer of sacral region, stage 2: Secondary | ICD-10-CM | POA: Diagnosis not present

## 2019-08-14 DIAGNOSIS — I5032 Chronic diastolic (congestive) heart failure: Secondary | ICD-10-CM | POA: Diagnosis not present

## 2019-08-14 DIAGNOSIS — I4891 Unspecified atrial fibrillation: Secondary | ICD-10-CM | POA: Diagnosis not present

## 2019-08-14 DIAGNOSIS — Z79810 Long term (current) use of selective estrogen receptor modulators (SERMs): Secondary | ICD-10-CM | POA: Diagnosis not present

## 2019-08-15 DIAGNOSIS — Z79810 Long term (current) use of selective estrogen receptor modulators (SERMs): Secondary | ICD-10-CM | POA: Diagnosis not present

## 2019-08-15 DIAGNOSIS — L89152 Pressure ulcer of sacral region, stage 2: Secondary | ICD-10-CM | POA: Diagnosis not present

## 2019-08-15 DIAGNOSIS — I10 Essential (primary) hypertension: Secondary | ICD-10-CM | POA: Diagnosis not present

## 2019-08-15 DIAGNOSIS — I4891 Unspecified atrial fibrillation: Secondary | ICD-10-CM | POA: Diagnosis not present

## 2019-08-15 DIAGNOSIS — I5032 Chronic diastolic (congestive) heart failure: Secondary | ICD-10-CM | POA: Diagnosis not present

## 2019-08-15 DIAGNOSIS — C55 Malignant neoplasm of uterus, part unspecified: Secondary | ICD-10-CM | POA: Diagnosis not present

## 2019-08-20 ENCOUNTER — Other Ambulatory Visit: Payer: Self-pay | Admitting: Cardiology

## 2019-08-21 DIAGNOSIS — L89152 Pressure ulcer of sacral region, stage 2: Secondary | ICD-10-CM | POA: Diagnosis not present

## 2019-08-21 DIAGNOSIS — I4891 Unspecified atrial fibrillation: Secondary | ICD-10-CM | POA: Diagnosis not present

## 2019-08-21 DIAGNOSIS — I5032 Chronic diastolic (congestive) heart failure: Secondary | ICD-10-CM | POA: Diagnosis not present

## 2019-08-21 DIAGNOSIS — I10 Essential (primary) hypertension: Secondary | ICD-10-CM | POA: Diagnosis not present

## 2019-08-21 DIAGNOSIS — Z79810 Long term (current) use of selective estrogen receptor modulators (SERMs): Secondary | ICD-10-CM | POA: Diagnosis not present

## 2019-08-21 DIAGNOSIS — C55 Malignant neoplasm of uterus, part unspecified: Secondary | ICD-10-CM | POA: Diagnosis not present

## 2019-08-22 DIAGNOSIS — I10 Essential (primary) hypertension: Secondary | ICD-10-CM | POA: Diagnosis not present

## 2019-08-22 DIAGNOSIS — L89152 Pressure ulcer of sacral region, stage 2: Secondary | ICD-10-CM | POA: Diagnosis not present

## 2019-08-22 DIAGNOSIS — I4891 Unspecified atrial fibrillation: Secondary | ICD-10-CM | POA: Diagnosis not present

## 2019-08-22 DIAGNOSIS — I5032 Chronic diastolic (congestive) heart failure: Secondary | ICD-10-CM | POA: Diagnosis not present

## 2019-08-22 DIAGNOSIS — Z79810 Long term (current) use of selective estrogen receptor modulators (SERMs): Secondary | ICD-10-CM | POA: Diagnosis not present

## 2019-08-22 DIAGNOSIS — C55 Malignant neoplasm of uterus, part unspecified: Secondary | ICD-10-CM | POA: Diagnosis not present

## 2019-08-25 DIAGNOSIS — I5032 Chronic diastolic (congestive) heart failure: Secondary | ICD-10-CM | POA: Diagnosis not present

## 2019-08-25 DIAGNOSIS — L89892 Pressure ulcer of other site, stage 2: Secondary | ICD-10-CM | POA: Diagnosis not present

## 2019-08-25 DIAGNOSIS — Z79810 Long term (current) use of selective estrogen receptor modulators (SERMs): Secondary | ICD-10-CM | POA: Diagnosis not present

## 2019-08-25 DIAGNOSIS — L89152 Pressure ulcer of sacral region, stage 2: Secondary | ICD-10-CM | POA: Diagnosis not present

## 2019-08-25 DIAGNOSIS — C55 Malignant neoplasm of uterus, part unspecified: Secondary | ICD-10-CM | POA: Diagnosis not present

## 2019-08-25 DIAGNOSIS — R2689 Other abnormalities of gait and mobility: Secondary | ICD-10-CM | POA: Diagnosis not present

## 2019-08-25 DIAGNOSIS — I4891 Unspecified atrial fibrillation: Secondary | ICD-10-CM | POA: Diagnosis not present

## 2019-08-25 DIAGNOSIS — G609 Hereditary and idiopathic neuropathy, unspecified: Secondary | ICD-10-CM | POA: Diagnosis not present

## 2019-08-25 DIAGNOSIS — I10 Essential (primary) hypertension: Secondary | ICD-10-CM | POA: Diagnosis not present

## 2019-08-25 DIAGNOSIS — Z8781 Personal history of (healed) traumatic fracture: Secondary | ICD-10-CM | POA: Diagnosis not present

## 2019-08-29 DIAGNOSIS — C541 Malignant neoplasm of endometrium: Secondary | ICD-10-CM | POA: Diagnosis not present

## 2019-08-29 DIAGNOSIS — I890 Lymphedema, not elsewhere classified: Secondary | ICD-10-CM | POA: Diagnosis not present

## 2019-08-30 DIAGNOSIS — C55 Malignant neoplasm of uterus, part unspecified: Secondary | ICD-10-CM | POA: Diagnosis not present

## 2019-08-30 DIAGNOSIS — I5032 Chronic diastolic (congestive) heart failure: Secondary | ICD-10-CM | POA: Diagnosis not present

## 2019-08-30 DIAGNOSIS — Z79810 Long term (current) use of selective estrogen receptor modulators (SERMs): Secondary | ICD-10-CM | POA: Diagnosis not present

## 2019-08-30 DIAGNOSIS — L89892 Pressure ulcer of other site, stage 2: Secondary | ICD-10-CM | POA: Diagnosis not present

## 2019-08-30 DIAGNOSIS — L89152 Pressure ulcer of sacral region, stage 2: Secondary | ICD-10-CM | POA: Diagnosis not present

## 2019-08-30 DIAGNOSIS — I10 Essential (primary) hypertension: Secondary | ICD-10-CM | POA: Diagnosis not present

## 2019-09-02 DIAGNOSIS — M79604 Pain in right leg: Secondary | ICD-10-CM | POA: Diagnosis not present

## 2019-09-02 DIAGNOSIS — M79605 Pain in left leg: Secondary | ICD-10-CM | POA: Diagnosis not present

## 2019-09-02 DIAGNOSIS — R29898 Other symptoms and signs involving the musculoskeletal system: Secondary | ICD-10-CM | POA: Diagnosis not present

## 2019-09-02 DIAGNOSIS — I890 Lymphedema, not elsewhere classified: Secondary | ICD-10-CM | POA: Diagnosis not present

## 2019-09-02 DIAGNOSIS — Z9989 Dependence on other enabling machines and devices: Secondary | ICD-10-CM | POA: Diagnosis not present

## 2019-09-04 DIAGNOSIS — L89152 Pressure ulcer of sacral region, stage 2: Secondary | ICD-10-CM | POA: Diagnosis not present

## 2019-09-04 DIAGNOSIS — I10 Essential (primary) hypertension: Secondary | ICD-10-CM | POA: Diagnosis not present

## 2019-09-04 DIAGNOSIS — Z79810 Long term (current) use of selective estrogen receptor modulators (SERMs): Secondary | ICD-10-CM | POA: Diagnosis not present

## 2019-09-04 DIAGNOSIS — L89892 Pressure ulcer of other site, stage 2: Secondary | ICD-10-CM | POA: Diagnosis not present

## 2019-09-04 DIAGNOSIS — I5032 Chronic diastolic (congestive) heart failure: Secondary | ICD-10-CM | POA: Diagnosis not present

## 2019-09-04 DIAGNOSIS — C55 Malignant neoplasm of uterus, part unspecified: Secondary | ICD-10-CM | POA: Diagnosis not present

## 2019-09-05 DIAGNOSIS — L89152 Pressure ulcer of sacral region, stage 2: Secondary | ICD-10-CM | POA: Diagnosis not present

## 2019-09-05 DIAGNOSIS — Z79810 Long term (current) use of selective estrogen receptor modulators (SERMs): Secondary | ICD-10-CM | POA: Diagnosis not present

## 2019-09-05 DIAGNOSIS — C55 Malignant neoplasm of uterus, part unspecified: Secondary | ICD-10-CM | POA: Diagnosis not present

## 2019-09-05 DIAGNOSIS — L89892 Pressure ulcer of other site, stage 2: Secondary | ICD-10-CM | POA: Diagnosis not present

## 2019-09-05 DIAGNOSIS — I10 Essential (primary) hypertension: Secondary | ICD-10-CM | POA: Diagnosis not present

## 2019-09-05 DIAGNOSIS — I5032 Chronic diastolic (congestive) heart failure: Secondary | ICD-10-CM | POA: Diagnosis not present

## 2019-09-09 DIAGNOSIS — L89892 Pressure ulcer of other site, stage 2: Secondary | ICD-10-CM | POA: Diagnosis not present

## 2019-09-09 DIAGNOSIS — I10 Essential (primary) hypertension: Secondary | ICD-10-CM | POA: Diagnosis not present

## 2019-09-09 DIAGNOSIS — L89152 Pressure ulcer of sacral region, stage 2: Secondary | ICD-10-CM | POA: Diagnosis not present

## 2019-09-09 DIAGNOSIS — I5032 Chronic diastolic (congestive) heart failure: Secondary | ICD-10-CM | POA: Diagnosis not present

## 2019-09-09 DIAGNOSIS — C55 Malignant neoplasm of uterus, part unspecified: Secondary | ICD-10-CM | POA: Diagnosis not present

## 2019-09-09 DIAGNOSIS — Z79810 Long term (current) use of selective estrogen receptor modulators (SERMs): Secondary | ICD-10-CM | POA: Diagnosis not present

## 2019-09-11 DIAGNOSIS — M79604 Pain in right leg: Secondary | ICD-10-CM | POA: Diagnosis not present

## 2019-09-11 DIAGNOSIS — I890 Lymphedema, not elsewhere classified: Secondary | ICD-10-CM | POA: Diagnosis not present

## 2019-09-11 DIAGNOSIS — M79605 Pain in left leg: Secondary | ICD-10-CM | POA: Diagnosis not present

## 2019-09-11 DIAGNOSIS — R29898 Other symptoms and signs involving the musculoskeletal system: Secondary | ICD-10-CM | POA: Diagnosis not present

## 2019-09-11 DIAGNOSIS — Z9989 Dependence on other enabling machines and devices: Secondary | ICD-10-CM | POA: Diagnosis not present

## 2019-09-12 DIAGNOSIS — L89152 Pressure ulcer of sacral region, stage 2: Secondary | ICD-10-CM | POA: Diagnosis not present

## 2019-09-12 DIAGNOSIS — C55 Malignant neoplasm of uterus, part unspecified: Secondary | ICD-10-CM | POA: Diagnosis not present

## 2019-09-12 DIAGNOSIS — I10 Essential (primary) hypertension: Secondary | ICD-10-CM | POA: Diagnosis not present

## 2019-09-12 DIAGNOSIS — I5032 Chronic diastolic (congestive) heart failure: Secondary | ICD-10-CM | POA: Diagnosis not present

## 2019-09-12 DIAGNOSIS — L89892 Pressure ulcer of other site, stage 2: Secondary | ICD-10-CM | POA: Diagnosis not present

## 2019-09-12 DIAGNOSIS — Z79810 Long term (current) use of selective estrogen receptor modulators (SERMs): Secondary | ICD-10-CM | POA: Diagnosis not present

## 2019-09-14 DIAGNOSIS — Z79810 Long term (current) use of selective estrogen receptor modulators (SERMs): Secondary | ICD-10-CM | POA: Diagnosis not present

## 2019-09-14 DIAGNOSIS — I5032 Chronic diastolic (congestive) heart failure: Secondary | ICD-10-CM | POA: Diagnosis not present

## 2019-09-14 DIAGNOSIS — C55 Malignant neoplasm of uterus, part unspecified: Secondary | ICD-10-CM | POA: Diagnosis not present

## 2019-09-14 DIAGNOSIS — I10 Essential (primary) hypertension: Secondary | ICD-10-CM | POA: Diagnosis not present

## 2019-09-14 DIAGNOSIS — L89152 Pressure ulcer of sacral region, stage 2: Secondary | ICD-10-CM | POA: Diagnosis not present

## 2019-09-14 DIAGNOSIS — L89892 Pressure ulcer of other site, stage 2: Secondary | ICD-10-CM | POA: Diagnosis not present

## 2019-09-16 DIAGNOSIS — I5032 Chronic diastolic (congestive) heart failure: Secondary | ICD-10-CM | POA: Diagnosis not present

## 2019-09-16 DIAGNOSIS — L89892 Pressure ulcer of other site, stage 2: Secondary | ICD-10-CM | POA: Diagnosis not present

## 2019-09-16 DIAGNOSIS — L89152 Pressure ulcer of sacral region, stage 2: Secondary | ICD-10-CM | POA: Diagnosis not present

## 2019-09-16 DIAGNOSIS — I10 Essential (primary) hypertension: Secondary | ICD-10-CM | POA: Diagnosis not present

## 2019-09-16 DIAGNOSIS — Z79810 Long term (current) use of selective estrogen receptor modulators (SERMs): Secondary | ICD-10-CM | POA: Diagnosis not present

## 2019-09-16 DIAGNOSIS — C55 Malignant neoplasm of uterus, part unspecified: Secondary | ICD-10-CM | POA: Diagnosis not present

## 2019-09-18 DIAGNOSIS — I890 Lymphedema, not elsewhere classified: Secondary | ICD-10-CM | POA: Diagnosis not present

## 2019-09-19 ENCOUNTER — Telehealth: Payer: Self-pay

## 2019-09-19 DIAGNOSIS — C55 Malignant neoplasm of uterus, part unspecified: Secondary | ICD-10-CM | POA: Diagnosis not present

## 2019-09-19 DIAGNOSIS — Z79810 Long term (current) use of selective estrogen receptor modulators (SERMs): Secondary | ICD-10-CM | POA: Diagnosis not present

## 2019-09-19 DIAGNOSIS — I5032 Chronic diastolic (congestive) heart failure: Secondary | ICD-10-CM | POA: Diagnosis not present

## 2019-09-19 DIAGNOSIS — L89892 Pressure ulcer of other site, stage 2: Secondary | ICD-10-CM | POA: Diagnosis not present

## 2019-09-19 DIAGNOSIS — I10 Essential (primary) hypertension: Secondary | ICD-10-CM | POA: Diagnosis not present

## 2019-09-19 DIAGNOSIS — L89152 Pressure ulcer of sacral region, stage 2: Secondary | ICD-10-CM | POA: Diagnosis not present

## 2019-09-19 NOTE — Telephone Encounter (Signed)
I would like her to see the extender in the office tomorrow

## 2019-09-19 NOTE — Telephone Encounter (Signed)
The patient's home health nurse Dorian Pod) reports gradual weight gain over several weeks. The patient's weight parameters are 169-179 pounds and she weighed 181 lbs this AM.  She went to the lymphedema clinic yesterday and had her wraps changed and has weeping from her left thigh. She will have her wraps changed again today. The patient denies any new symptoms. She has some DOE but it is no worse than baseline.  Her BP has been running 130-50/60-80. Her HR has been  In th 71s. She has been taking her medications as instructed, including torsemide 40 mg BID and hydralazine 10 mg BID.  She is stringently limiting her salt. Dorian Pod would like to know if Dr. Radford Pax would like to make medication changes or change her weight parameters.  She was grateful for assistance.   Will call daughter, Justyce, at [phone] for updates/changes per Dorian Pod and patient's request.

## 2019-09-19 NOTE — Telephone Encounter (Signed)
Called and made next available appointment with Richardson Dopp, PA on 09/24/19 at 8:45 AM. Instructed for patient to let us know if her Sx change or worsen before then.

## 2019-09-23 ENCOUNTER — Inpatient Hospital Stay (HOSPITAL_COMMUNITY)
Admission: EM | Admit: 2019-09-23 | Discharge: 2019-09-27 | DRG: 481 | Disposition: A | Discharge status: Rehabilitation Facility | Payer: Medicare Other | Attending: Internal Medicine | Admitting: Internal Medicine

## 2019-09-23 ENCOUNTER — Emergency Department (HOSPITAL_COMMUNITY): Payer: Medicare Other

## 2019-09-23 ENCOUNTER — Other Ambulatory Visit: Payer: Self-pay

## 2019-09-23 ENCOUNTER — Encounter (HOSPITAL_COMMUNITY): Payer: Self-pay | Admitting: Emergency Medicine

## 2019-09-23 DIAGNOSIS — E559 Vitamin D deficiency, unspecified: Secondary | ICD-10-CM | POA: Diagnosis present

## 2019-09-23 DIAGNOSIS — T148XXA Other injury of unspecified body region, initial encounter: Secondary | ICD-10-CM

## 2019-09-23 DIAGNOSIS — Z79899 Other long term (current) drug therapy: Secondary | ICD-10-CM

## 2019-09-23 DIAGNOSIS — E8889 Other specified metabolic disorders: Secondary | ICD-10-CM | POA: Diagnosis present

## 2019-09-23 DIAGNOSIS — Z7901 Long term (current) use of anticoagulants: Secondary | ICD-10-CM | POA: Diagnosis not present

## 2019-09-23 DIAGNOSIS — Z20828 Contact with and (suspected) exposure to other viral communicable diseases: Secondary | ICD-10-CM | POA: Diagnosis present

## 2019-09-23 DIAGNOSIS — Z8249 Family history of ischemic heart disease and other diseases of the circulatory system: Secondary | ICD-10-CM | POA: Diagnosis not present

## 2019-09-23 DIAGNOSIS — L89152 Pressure ulcer of sacral region, stage 2: Secondary | ICD-10-CM | POA: Diagnosis not present

## 2019-09-23 DIAGNOSIS — I130 Hypertensive heart and chronic kidney disease with heart failure and stage 1 through stage 4 chronic kidney disease, or unspecified chronic kidney disease: Secondary | ICD-10-CM | POA: Diagnosis present

## 2019-09-23 DIAGNOSIS — G8929 Other chronic pain: Secondary | ICD-10-CM | POA: Diagnosis present

## 2019-09-23 DIAGNOSIS — Y9301 Activity, walking, marching and hiking: Secondary | ICD-10-CM | POA: Diagnosis present

## 2019-09-23 DIAGNOSIS — S0990XA Unspecified injury of head, initial encounter: Secondary | ICD-10-CM | POA: Diagnosis not present

## 2019-09-23 DIAGNOSIS — N183 Chronic kidney disease, stage 3 unspecified: Secondary | ICD-10-CM | POA: Diagnosis present

## 2019-09-23 DIAGNOSIS — S199XXA Unspecified injury of neck, initial encounter: Secondary | ICD-10-CM | POA: Diagnosis not present

## 2019-09-23 DIAGNOSIS — W19XXXD Unspecified fall, subsequent encounter: Secondary | ICD-10-CM | POA: Diagnosis not present

## 2019-09-23 DIAGNOSIS — M4854XA Collapsed vertebra, not elsewhere classified, thoracic region, initial encounter for fracture: Secondary | ICD-10-CM | POA: Diagnosis present

## 2019-09-23 DIAGNOSIS — N1831 Chronic kidney disease, stage 3a: Secondary | ICD-10-CM | POA: Diagnosis present

## 2019-09-23 DIAGNOSIS — S7221XS Displaced subtrochanteric fracture of right femur, sequela: Secondary | ICD-10-CM | POA: Diagnosis not present

## 2019-09-23 DIAGNOSIS — M545 Low back pain: Secondary | ICD-10-CM | POA: Diagnosis not present

## 2019-09-23 DIAGNOSIS — S7221XA Displaced subtrochanteric fracture of right femur, initial encounter for closed fracture: Secondary | ICD-10-CM | POA: Diagnosis not present

## 2019-09-23 DIAGNOSIS — S72141A Displaced intertrochanteric fracture of right femur, initial encounter for closed fracture: Secondary | ICD-10-CM | POA: Diagnosis present

## 2019-09-23 DIAGNOSIS — Z8542 Personal history of malignant neoplasm of other parts of uterus: Secondary | ICD-10-CM

## 2019-09-23 DIAGNOSIS — Z01818 Encounter for other preprocedural examination: Secondary | ICD-10-CM | POA: Diagnosis not present

## 2019-09-23 DIAGNOSIS — C55 Malignant neoplasm of uterus, part unspecified: Secondary | ICD-10-CM | POA: Diagnosis not present

## 2019-09-23 DIAGNOSIS — I5032 Chronic diastolic (congestive) heart failure: Secondary | ICD-10-CM | POA: Diagnosis present

## 2019-09-23 DIAGNOSIS — G44309 Post-traumatic headache, unspecified, not intractable: Secondary | ICD-10-CM | POA: Diagnosis not present

## 2019-09-23 DIAGNOSIS — S32000A Wedge compression fracture of unspecified lumbar vertebra, initial encounter for closed fracture: Secondary | ICD-10-CM | POA: Diagnosis not present

## 2019-09-23 DIAGNOSIS — S3992XA Unspecified injury of lower back, initial encounter: Secondary | ICD-10-CM | POA: Diagnosis not present

## 2019-09-23 DIAGNOSIS — Z880 Allergy status to penicillin: Secondary | ICD-10-CM | POA: Diagnosis not present

## 2019-09-23 DIAGNOSIS — Z23 Encounter for immunization: Secondary | ICD-10-CM | POA: Diagnosis not present

## 2019-09-23 DIAGNOSIS — R2689 Other abnormalities of gait and mobility: Secondary | ICD-10-CM | POA: Diagnosis not present

## 2019-09-23 DIAGNOSIS — R0602 Shortness of breath: Secondary | ICD-10-CM | POA: Diagnosis not present

## 2019-09-23 DIAGNOSIS — L89892 Pressure ulcer of other site, stage 2: Secondary | ICD-10-CM | POA: Diagnosis not present

## 2019-09-23 DIAGNOSIS — M546 Pain in thoracic spine: Secondary | ICD-10-CM | POA: Diagnosis not present

## 2019-09-23 DIAGNOSIS — R0902 Hypoxemia: Secondary | ICD-10-CM | POA: Diagnosis not present

## 2019-09-23 DIAGNOSIS — Z8781 Personal history of (healed) traumatic fracture: Secondary | ICD-10-CM | POA: Diagnosis not present

## 2019-09-23 DIAGNOSIS — S22040A Wedge compression fracture of fourth thoracic vertebra, initial encounter for closed fracture: Secondary | ICD-10-CM | POA: Diagnosis not present

## 2019-09-23 DIAGNOSIS — M898X9 Other specified disorders of bone, unspecified site: Secondary | ICD-10-CM | POA: Diagnosis present

## 2019-09-23 DIAGNOSIS — S7221XD Displaced subtrochanteric fracture of right femur, subsequent encounter for closed fracture with routine healing: Secondary | ICD-10-CM | POA: Diagnosis not present

## 2019-09-23 DIAGNOSIS — M810 Age-related osteoporosis without current pathological fracture: Secondary | ICD-10-CM | POA: Diagnosis present

## 2019-09-23 DIAGNOSIS — Z03818 Encounter for observation for suspected exposure to other biological agents ruled out: Secondary | ICD-10-CM | POA: Diagnosis not present

## 2019-09-23 DIAGNOSIS — S22000A Wedge compression fracture of unspecified thoracic vertebra, initial encounter for closed fracture: Secondary | ICD-10-CM

## 2019-09-23 DIAGNOSIS — I480 Paroxysmal atrial fibrillation: Secondary | ICD-10-CM | POA: Diagnosis not present

## 2019-09-23 DIAGNOSIS — I358 Other nonrheumatic aortic valve disorders: Secondary | ICD-10-CM | POA: Diagnosis not present

## 2019-09-23 DIAGNOSIS — S72141D Displaced intertrochanteric fracture of right femur, subsequent encounter for closed fracture with routine healing: Secondary | ICD-10-CM | POA: Diagnosis not present

## 2019-09-23 DIAGNOSIS — I890 Lymphedema, not elsewhere classified: Secondary | ICD-10-CM | POA: Diagnosis present

## 2019-09-23 DIAGNOSIS — M4856XA Collapsed vertebra, not elsewhere classified, lumbar region, initial encounter for fracture: Secondary | ICD-10-CM | POA: Diagnosis present

## 2019-09-23 DIAGNOSIS — Y92009 Unspecified place in unspecified non-institutional (private) residence as the place of occurrence of the external cause: Secondary | ICD-10-CM

## 2019-09-23 DIAGNOSIS — D62 Acute posthemorrhagic anemia: Secondary | ICD-10-CM | POA: Diagnosis not present

## 2019-09-23 DIAGNOSIS — S299XXA Unspecified injury of thorax, initial encounter: Secondary | ICD-10-CM | POA: Diagnosis not present

## 2019-09-23 DIAGNOSIS — E785 Hyperlipidemia, unspecified: Secondary | ICD-10-CM | POA: Diagnosis present

## 2019-09-23 DIAGNOSIS — Z90710 Acquired absence of both cervix and uterus: Secondary | ICD-10-CM | POA: Diagnosis not present

## 2019-09-23 DIAGNOSIS — Z419 Encounter for procedure for purposes other than remedying health state, unspecified: Secondary | ICD-10-CM

## 2019-09-23 DIAGNOSIS — N179 Acute kidney failure, unspecified: Secondary | ICD-10-CM | POA: Diagnosis not present

## 2019-09-23 DIAGNOSIS — Z808 Family history of malignant neoplasm of other organs or systems: Secondary | ICD-10-CM | POA: Diagnosis not present

## 2019-09-23 DIAGNOSIS — W010XXA Fall on same level from slipping, tripping and stumbling without subsequent striking against object, initial encounter: Secondary | ICD-10-CM | POA: Diagnosis present

## 2019-09-23 DIAGNOSIS — W19XXXA Unspecified fall, initial encounter: Secondary | ICD-10-CM | POA: Diagnosis present

## 2019-09-23 DIAGNOSIS — Z79810 Long term (current) use of selective estrogen receptor modulators (SERMs): Secondary | ICD-10-CM | POA: Diagnosis not present

## 2019-09-23 DIAGNOSIS — I4891 Unspecified atrial fibrillation: Secondary | ICD-10-CM | POA: Diagnosis not present

## 2019-09-23 DIAGNOSIS — G609 Hereditary and idiopathic neuropathy, unspecified: Secondary | ICD-10-CM | POA: Diagnosis not present

## 2019-09-23 DIAGNOSIS — R062 Wheezing: Secondary | ICD-10-CM | POA: Diagnosis not present

## 2019-09-23 DIAGNOSIS — I872 Venous insufficiency (chronic) (peripheral): Secondary | ICD-10-CM | POA: Diagnosis present

## 2019-09-23 DIAGNOSIS — R52 Pain, unspecified: Secondary | ICD-10-CM | POA: Diagnosis not present

## 2019-09-23 DIAGNOSIS — I371 Nonrheumatic pulmonary valve insufficiency: Secondary | ICD-10-CM | POA: Diagnosis not present

## 2019-09-23 DIAGNOSIS — I10 Essential (primary) hypertension: Secondary | ICD-10-CM | POA: Diagnosis not present

## 2019-09-23 DIAGNOSIS — S72001D Fracture of unspecified part of neck of right femur, subsequent encounter for closed fracture with routine healing: Secondary | ICD-10-CM | POA: Diagnosis not present

## 2019-09-23 DIAGNOSIS — N189 Chronic kidney disease, unspecified: Secondary | ICD-10-CM | POA: Diagnosis not present

## 2019-09-23 DIAGNOSIS — I110 Hypertensive heart disease with heart failure: Secondary | ICD-10-CM | POA: Diagnosis not present

## 2019-09-23 DIAGNOSIS — I519 Heart disease, unspecified: Secondary | ICD-10-CM | POA: Diagnosis not present

## 2019-09-23 DIAGNOSIS — K5903 Drug induced constipation: Secondary | ICD-10-CM | POA: Diagnosis not present

## 2019-09-23 DIAGNOSIS — S32010A Wedge compression fracture of first lumbar vertebra, initial encounter for closed fracture: Secondary | ICD-10-CM | POA: Diagnosis not present

## 2019-09-23 LAB — BASIC METABOLIC PANEL WITH GFR
Anion gap: 9 (ref 5–15)
BUN: 21 mg/dL (ref 8–23)
CO2: 28 mmol/L (ref 22–32)
Calcium: 8.6 mg/dL — ABNORMAL LOW (ref 8.9–10.3)
Chloride: 104 mmol/L (ref 98–111)
Creatinine, Ser: 1.49 mg/dL — ABNORMAL HIGH (ref 0.44–1.00)
GFR calc Af Amer: 37 mL/min — ABNORMAL LOW
GFR calc non Af Amer: 32 mL/min — ABNORMAL LOW
Glucose, Bld: 118 mg/dL — ABNORMAL HIGH (ref 70–99)
Potassium: 3.8 mmol/L (ref 3.5–5.1)
Sodium: 141 mmol/L (ref 135–145)

## 2019-09-23 LAB — CBC WITH DIFFERENTIAL/PLATELET
Abs Immature Granulocytes: 0.06 10*3/uL (ref 0.00–0.07)
Basophils Absolute: 0 10*3/uL (ref 0.0–0.1)
Basophils Relative: 0 %
Eosinophils Absolute: 0 10*3/uL (ref 0.0–0.5)
Eosinophils Relative: 0 %
HCT: 36.3 % (ref 36.0–46.0)
Hemoglobin: 11.4 g/dL — ABNORMAL LOW (ref 12.0–15.0)
Immature Granulocytes: 1 %
Lymphocytes Relative: 10 %
Lymphs Abs: 0.9 10*3/uL (ref 0.7–4.0)
MCH: 31.4 pg (ref 26.0–34.0)
MCHC: 31.4 g/dL (ref 30.0–36.0)
MCV: 100 fL (ref 80.0–100.0)
Monocytes Absolute: 0.8 10*3/uL (ref 0.1–1.0)
Monocytes Relative: 9 %
Neutro Abs: 7.1 10*3/uL (ref 1.7–7.7)
Neutrophils Relative %: 80 %
Platelets: 226 10*3/uL (ref 150–400)
RBC: 3.63 MIL/uL — ABNORMAL LOW (ref 3.87–5.11)
RDW: 13.6 % (ref 11.5–15.5)
WBC: 8.9 10*3/uL (ref 4.0–10.5)
nRBC: 0 % (ref 0.0–0.2)

## 2019-09-23 LAB — SARS CORONAVIRUS 2 (TAT 6-24 HRS): SARS Coronavirus 2: NEGATIVE

## 2019-09-23 LAB — TROPONIN I (HIGH SENSITIVITY): Troponin I (High Sensitivity): 8 ng/L (ref <18)

## 2019-09-23 MED ORDER — ONDANSETRON HCL 4 MG/2ML IJ SOLN
4.0000 mg | Freq: Once | INTRAMUSCULAR | Status: AC
  Administered 2019-09-23: 4 mg via INTRAVENOUS
  Filled 2019-09-23: qty 2

## 2019-09-23 MED ORDER — HYDRALAZINE HCL 10 MG PO TABS
10.0000 mg | ORAL_TABLET | Freq: Three times a day (TID) | ORAL | Status: DC
  Administered 2019-09-24 – 2019-09-25 (×3): 10 mg via ORAL
  Filled 2019-09-23 (×4): qty 1

## 2019-09-23 MED ORDER — MORPHINE SULFATE (PF) 4 MG/ML IV SOLN
4.0000 mg | Freq: Once | INTRAVENOUS | Status: AC
  Administered 2019-09-23: 4 mg via INTRAVENOUS
  Filled 2019-09-23: qty 1

## 2019-09-23 MED ORDER — ATORVASTATIN CALCIUM 10 MG PO TABS
10.0000 mg | ORAL_TABLET | Freq: Every day | ORAL | Status: DC
  Administered 2019-09-24 – 2019-09-26 (×4): 10 mg via ORAL
  Filled 2019-09-23 (×5): qty 1

## 2019-09-23 MED ORDER — FENTANYL CITRATE (PF) 100 MCG/2ML IJ SOLN
100.0000 ug | Freq: Once | INTRAMUSCULAR | Status: AC
  Administered 2019-09-23: 100 ug via INTRAVENOUS
  Filled 2019-09-23: qty 2

## 2019-09-23 MED ORDER — ATENOLOL 50 MG PO TABS
50.0000 mg | ORAL_TABLET | Freq: Two times a day (BID) | ORAL | Status: DC
  Administered 2019-09-24 – 2019-09-25 (×3): 50 mg via ORAL
  Filled 2019-09-23 (×4): qty 1

## 2019-09-23 MED ORDER — FLECAINIDE ACETATE 100 MG PO TABS
100.0000 mg | ORAL_TABLET | Freq: Two times a day (BID) | ORAL | Status: DC
  Administered 2019-09-24 – 2019-09-27 (×8): 100 mg via ORAL
  Filled 2019-09-23 (×9): qty 1

## 2019-09-23 NOTE — ED Triage Notes (Signed)
Pt BIB [facility] from home. Pt complaint is a mechanical fall with right hip fracture. Right hip presents with shortening and external rotation. No loss of consciousness. GCS 15. Pt received 100 mcg fentanyl via EMS. Pain 0/10 at rest. Pt with history of CHF.

## 2019-09-23 NOTE — ED Notes (Signed)
Ortho provider stated patient will have surgery tomorrow and able to drink water now. Given patient water.

## 2019-09-23 NOTE — Anesthesia Preprocedure Evaluation (Addendum)
Anesthesia Evaluation  Patient identified by MRN, date of birth, ID band Patient awake    Reviewed: Allergy & Precautions, NPO status , Patient's Chart, lab work & pertinent test results  Airway Mallampati: II  TM Distance: >3 FB Neck ROM: Full    Dental no notable dental hx. (+) Teeth Intact, Dental Advisory Given   Pulmonary neg pulmonary ROS,    Pulmonary exam normal breath sounds clear to auscultation       Cardiovascular hypertension, Pt. on medications +CHF  Normal cardiovascular exam+ dysrhythmias Atrial Fibrillation  Rhythm:Regular Rate:Normal     Neuro/Psych negative neurological ROS  negative psych ROS   GI/Hepatic   Endo/Other  negative endocrine ROS  Renal/GU CRFRenal diseaseK+ 3.8 Cr 1.49     Musculoskeletal negative musculoskeletal ROS (+)   Abdominal   Peds  Hematology negative hematology ROS (+) Hgb 11.9 Plt 226   Anesthesia Other Findings   Reproductive/Obstetrics negative OB ROS                            Anesthesia Physical Anesthesia Plan  ASA: III  Anesthesia Plan: General   Post-op Pain Management:    Induction: Intravenous  PONV Risk Score and Plan: 4 or greater and Treatment may vary due to age or medical condition, Ondansetron and Dexamethasone  Airway Management Planned: Oral ETT  Additional Equipment:   Intra-op Plan:   Post-operative Plan: Extubation in OR  Informed Consent: I have reviewed the patients History and Physical, chart, labs and discussed the procedure including the risks, benefits and alternatives for the proposed anesthesia with the patient or authorized representative who has indicated his/her understanding and acceptance.     Dental advisory given  Plan Discussed with: CRNA  Anesthesia Plan Comments:         Anesthesia Quick Evaluation

## 2019-09-23 NOTE — ED Provider Notes (Signed)
Pt signed out to me by L Layden, PA-C. Please see previous notes for further history.   In brief, pt presenting for evaluation after mechanical fall. R hip fx. Ortho has seen pt, plan for surgery tomorrow. Also new compression fx of back. Pending hospitalist admit.   Discussed with Dr. Zigmund Daniel from [facility], pt to be admitted.    Franchot Heidelberg, PA-C 09/23/19 1741    Hayden Rasmussen, MD 09/23/19 [phone]

## 2019-09-23 NOTE — ED Provider Notes (Signed)
[facility] EMERGENCY DEPARTMENT Provider Note   CSN: [id number] Arrival date & time: 09/23/19  1252     History   Chief Complaint Chief Complaint  Patient presents with   Fall   Hip Injury    HPI Christina Morgan is a 85 y.o. female BIB [facility] for evaluation of right hip pain after mechanical fall.  Patient reports that she was walking and tripped, causing her to fall and land on her right side and back.  Patient reports that she thinks she may have hit her head but did not have any LOC.  She reports she has been unable to ambulate or bear weight on the leg since the incident.  She is on Eliquis for what she thinks is A. Fib.  She received 100 mcg of fentanyl in route with improvement in pain.  Patient denies any preceding chest pain or dizziness.  She does not any vision changes, numbness/weakness of arms or legs, chest pain, difficulty breathing, abdominal pain, nausea/vomiting.     The history is provided by the patient.    Past Medical History:  Diagnosis Date   Cancer [facility])    endometrial ca   Chronic diastolic CHF (congestive heart failure) (HCC)    Chronic edema    CKD (chronic kidney disease), stage III    Dyslipidemia    Fracture of left humerus    History of cardiovascular stress test    Lexiscan Myoview (06/2014): No ischemia or scar, not gated, low risk   Hx of echocardiogram    Echo (05/02/14): EF 60% to 65%. No regional wall motion abnormalities. Mild AI, mildly dilated aortic root (37 mm), trivial MR, trivial TR   Hyperlipidemia    Hypertension    Lipoma of skin    multiple   Osteopenia    PAF (paroxysmal atrial fibrillation) ([place])    failed DCCV 05/2014 >> Flecainide started >> DCCV 7/15 sucessful;  f/u ETT neg for pro-arrhythmia >> recurrent AF/AFL >> Flecainide inc to 100 bid with repeat DCCV 9/15    Patient Active Problem List   Diagnosis Date Noted   Fall 09/23/2019   SOB (shortness of breath) 01/04/2019    Bilateral lower leg cellulitis 03/30/2018   Chronic diastolic heart failure ([place]) 03/30/2018   Edema of extremities 08/19/2014   Essential hypertension, benign 05/15/2014   Mixed hyperlipidemia 05/15/2014   Persistent atrial fibrillation ([place]) 05/15/2014   Endometrial ca ([place]) 12/28/2012    Past Surgical History:  Procedure Laterality Date   CARDIOVERSION N/A 06/05/2014   Procedure: CARDIOVERSION;  Surgeon: Sueanne Margarita, MD;  Location: [place];  Service: Cardiovascular;  Laterality: N/A;   CARDIOVERSION N/A 07/11/2014   Procedure: CARDIOVERSION;  Surgeon: Sueanne Margarita, MD;  Location: [place] ENDOSCOPY;  Service: Cardiovascular;  Laterality: N/A;   CARDIOVERSION N/A 09/04/2014   Procedure: CARDIOVERSION;  Surgeon: Sueanne Margarita, MD;  Location: [place] ENDOSCOPY;  Service: Cardiovascular;  Laterality: N/A;   COLONOSCOPY  2008   COLONOSCOPY N/A 05/20/2015   Procedure: COLONOSCOPY;  Surgeon: Ronald Lobo, MD;  Location: [facility] ENDOSCOPY;  Service: Endoscopy;  Laterality: N/A;   IR KYPHO EA ADDL LEVEL THORACIC OR LUMBAR  04/12/2019   IR KYPHO THORACIC WITH BONE BIOPSY  04/12/2019   ROBOTIC ASSISTED SUPRACERVICAL HYSTERECTOMY WITH BILATERAL SALPINGO OOPHERECTOMY  11/06/2012   and bilateral pelvic lymph node dissection     OB History   No obstetric history on file.    Obstetric Comments  G2P2 with 2 vaginal deliveries. Reports  menarche in her teens and menopause in her 59 s. Denies hx of abnormal pap smears. Patient reports last mammogram was 04/2012.         Home Medications    Prior to Admission medications   Medication Sig Start Date End Date Taking? Authorizing Provider  apixaban (ELIQUIS) 5 MG TABS tablet Take 5 mg by mouth 2 (two) times daily.   Yes [provider]  atenolol (TENORMIN) 50 MG tablet TAKE 1 TABLET BY MOUTH TWICE DAILY Patient taking differently: Take 50 mg by mouth 2 (two) times daily.  05/02/19  Yes Turner, Eber Hong, MD  atorvastatin (LIPITOR) 10 MG  tablet Take 10 mg by mouth at bedtime.  10/04/12  Yes [provider]  flecainide (TAMBOCOR) 100 MG tablet TAKE 1 TABLET BY MOUTH TWICE DAILY Patient taking differently: Take 100 mg by mouth 2 (two) times daily.  08/22/19  Yes Turner, Eber Hong, MD  hydrALAZINE (APRESOLINE) 10 MG tablet Take 1 tablet (10 mg total) by mouth 2 (two) times a day. 06/21/19 06/15/20 Yes Turner, Eber Hong, MD  megestrol (MEGACE) 40 MG tablet Take 80 mg by mouth 2 (two) times daily. Take for 3 weeks , then alternate with tamoxifen 20 mg 1 daily for 3 weeks, etc.   Yes [provider]  potassium chloride SA (K-DUR) 20 MEQ tablet Take 1 tablet (20 mEq total) by mouth 2 (two) times daily. 08/08/19  Yes Turner, Eber Hong, MD  tamoxifen (NOLVADEX) 20 MG tablet Take 20 mg by mouth 2 (two) times daily. Alternate every 3 weeks with megestrol. 02/07/19  Yes [provider]  torsemide (DEMADEX) 20 MG tablet Take 2 tablets (40 mg total) by mouth 2 (two) times daily. 06/21/19 06/15/20 Yes Turner, Eber Hong, MD    Family History Family History  Problem Relation Age of Onset   Cancer Father 38       metastatic oropharyngeal ca   Heart attack Mother    Coronary artery disease Mother     Social History Social History   Tobacco Use   Smoking status: Never Smoker   Smokeless tobacco: Never Used  Substance Use Topics   Alcohol use: No   Drug use: No     Allergies   Penicillins   Review of Systems Review of Systems  Respiratory: Negative for cough and shortness of breath.   Cardiovascular: Negative for chest pain.  Gastrointestinal: Negative for abdominal pain, nausea and vomiting.  Genitourinary: Negative for dysuria and hematuria.  Musculoskeletal:       Right hip pain  Neurological: Negative for weakness, numbness and headaches.  All other systems reviewed and are negative.    Physical Exam Updated Vital Signs BP (!) 131/54 (BP Location: Left Arm)    Pulse 66    Temp 98.4 F (36.9 C)  (Oral)    Resp 16    Ht 5\' 6"  (1.676 m)    Wt 80.9 kg    SpO2 96%    BMI 28.79 kg/m   Physical Exam Vitals signs and nursing note reviewed.  Constitutional:      Appearance: Normal appearance. She is well-developed.  HENT:     Head: Normocephalic and atraumatic.     Comments: No tenderness to palpation of skull. No deformities or crepitus noted. No open wounds, abrasions or lacerations.  Eyes:     General: Lids are normal.     Conjunctiva/sclera: Conjunctivae normal.     Pupils: Pupils are equal, round, and reactive to light.  Neck:     Musculoskeletal: Full passive range of motion without pain.  Cardiovascular:     Rate and Rhythm: Normal rate and regular rhythm.     Pulses:          Radial pulses are 2+ on the right side and 2+ on the left side.       Dorsalis pedis pulses are 2+ on the right side and 2+ on the left side.     Heart sounds: Normal heart sounds. No murmur. No friction rub. No gallop.   Pulmonary:     Effort: Pulmonary effort is normal.     Breath sounds: Normal breath sounds.     Comments: Lungs clear to auscultation bilaterally.  Symmetric chest rise.  No wheezing, rales, rhonchi. Abdominal:     Palpations: Abdomen is soft. Abdomen is not rigid.     Tenderness: There is no abdominal tenderness. There is no guarding.     Comments: Abdomen is soft, non-distended, non-tender. No rigidity, No guarding. No peritoneal signs.  Musculoskeletal: Normal range of motion.     Comments: Right lower extremity with evidence of rotation or shortening.  Since palpation noted to right hip.  Limited range of motion secondary to pain.  No bony tenderness noted to right knee, right tib-fib, right ankle.  No tenderness palpation in left lower extremity.  No pelvic instability noted.  Skin:    General: Skin is warm and dry.     Capillary Refill: Capillary refill takes less than 2 seconds.  Neurological:     Mental Status: She is alert and oriented to person, place, and time.      Comments: Cranial nerves III-XII intact Follows commands, Moves all extremities  5/5 strength to BUE and LLE. Limited strength of RLE secondary to pain.  Sensation intact throughout all major nerve distributions No slurred speech. No facial droop.   Psychiatric:        Speech: Speech normal.      ED Treatments / Results  Labs (all labs ordered are listed, but only abnormal results are displayed) Labs Reviewed  MRSA PCR SCREENING - Abnormal; Notable for the following components:      Result Value   MRSA by PCR POSITIVE (*)    All other components within normal limits  CBC WITH DIFFERENTIAL/PLATELET - Abnormal; Notable for the following components:   RBC 3.63 (*)    Hemoglobin 11.4 (*)    All other components within normal limits  BASIC METABOLIC PANEL - Abnormal; Notable for the following components:   Glucose, Bld 118 (*)    Creatinine, Ser 1.49 (*)    Calcium 8.6 (*)    GFR calc non Af Amer 32 (*)    GFR calc Af Amer 37 (*)    All other components within normal limits  CBC WITH DIFFERENTIAL/PLATELET - Abnormal; Notable for the following components:   RBC 3.22 (*)    Hemoglobin 10.5 (*)    HCT 31.8 (*)    All other components within normal limits  COMPREHENSIVE METABOLIC PANEL - Abnormal; Notable for the following components:   Glucose, Bld 118 (*)    Creatinine, Ser 1.60 (*)    Calcium 8.5 (*)    Total Protein 5.3 (*)    Albumin 2.8 (*)    GFR calc non Af Amer 29 (*)    GFR calc Af Amer 34 (*)    All other components within normal limits  SARS CORONAVIRUS 2 (TAT 6-24 HRS)  TROPONIN I (HIGH  SENSITIVITY)  TROPONIN I (HIGH SENSITIVITY)    EKG EKG Interpretation  Date/Time:  Monday September 23 2019 13:05:50 EDT Ventricular Rate:  68 PR Interval:    QRS Duration: 117 QT Interval:  484 QTC Calculation: 515 R Axis:   83 Text Interpretation:  Sinus rhythm Nonspecific intraventricular conduction delay Consider anterior infarct similar to prior 4/19 Confirmed by  Aletta Edouard [id number]) on 09/23/2019 5:02:43 PM   Radiology Dg Chest 1 View  Result Date: 09/23/2019 CLINICAL DATA:  Fall. EXAM: CHEST  1 VIEW COMPARISON:  March 30, 2018. FINDINGS: Mild cardiomegaly is noted. Atherosclerosis of thoracic aorta is noted. No pneumothorax is noted. Lungs are clear. Bony thorax is unremarkable. IMPRESSION: No acute cardiopulmonary abnormality seen. Aortic Atherosclerosis (ICD10-I70.0). Electronically Signed   By: Marijo Conception M.D.   On: 09/23/2019 15:58   Dg Thoracic Spine 2 View  Result Date: 09/23/2019 CLINICAL DATA:  Back pain after fall. EXAM: THORACIC SPINE 2 VIEWS COMPARISON:  Apr 24, 2019. FINDINGS: Status post T11 and T12 kyphoplasty. New compression deformities are seen involving the T7, T5 and T4 vertebral bodies concerning for fractures of indeterminate age. No spondylolisthesis is noted. Diffuse osteopenia is noted. IMPRESSION: Status post T11 and T12 kyphoplasty. New compression deformities are noted at T4, T5 and T7 concerning for fractures of indeterminate age. MRI may be performed for further evaluation. Electronically Signed   By: Marijo Conception M.D.   On: 09/23/2019 15:55   Dg Lumbar Spine Complete  Result Date: 09/23/2019 CLINICAL DATA:  Low back pain after fall. EXAM: LUMBAR SPINE - COMPLETE 4+ VIEW COMPARISON:  December 18, 2017. FINDINGS: Mild levoscoliosis of lumbar spine is noted. Status post kyphoplasty of T11 and T12 vertebral bodies. Diffuse osteopenia is noted. Severe compression deformity of L1 vertebral body is noted concerning for fracture of indeterminate age. Mild degenerative disc disease is noted at L2-3, L3-4 and L4-5. IMPRESSION: Interval development of compression deformity of L1 vertebral body concerning for fracture of indeterminate age. MRI may be performed to evaluate for acute fracture. Multilevel degenerative disc disease is noted. Diffuse osteopenia. Electronically Signed   By: Marijo Conception M.D.   On: 09/23/2019 15:57     Ct Head Wo Contrast  Result Date: 09/23/2019 CLINICAL DATA:  Posttraumatic headache after fall. No loss of consciousness. EXAM: CT HEAD WITHOUT CONTRAST CT CERVICAL SPINE WITHOUT CONTRAST TECHNIQUE: Multidetector CT imaging of the head and cervical spine was performed following the standard protocol without intravenous contrast. Multiplanar CT image reconstructions of the cervical spine were also generated. COMPARISON:  None. FINDINGS: CT HEAD FINDINGS Brain: Mild chronic ischemic white matter disease is noted. Large calcified left parietal meningioma is noted. No mass effect or midline shift is noted. No hemorrhage or acute infarction is noted. Ventricular size is within normal limits. Vascular: No hyperdense vessel or unexpected calcification. Skull: Normal. Negative for fracture or focal lesion. Sinuses/Orbits: No acute finding. Other: None. CT CERVICAL SPINE FINDINGS Alignment: Minimal grade 1 anterolisthesis of C5-6 is noted secondary to posterior facet joint hypertrophy. Skull base and vertebrae: No acute fracture. No primary bone lesion or focal pathologic process. Soft tissues and spinal canal: No prevertebral fluid or swelling. No visible canal hematoma. Disc levels: Mild degenerative disc disease is noted at C5-6 and C6-7. Upper chest: Negative. Other: Degenerative changes are seen involving the right-sided posterior facet joints. IMPRESSION: Mild chronic ischemic white matter disease. Large calcified left parietal meningioma. No acute intracranial abnormality seen. Mild multilevel degenerative disc disease. No acute  abnormality seen in the cervical spine. Electronically Signed   By: Marijo Conception M.D.   On: 09/23/2019 14:14   Ct Cervical Spine Wo Contrast  Result Date: 09/23/2019 CLINICAL DATA:  Posttraumatic headache after fall. No loss of consciousness. EXAM: CT HEAD WITHOUT CONTRAST CT CERVICAL SPINE WITHOUT CONTRAST TECHNIQUE: Multidetector CT imaging of the head and cervical spine was  performed following the standard protocol without intravenous contrast. Multiplanar CT image reconstructions of the cervical spine were also generated. COMPARISON:  None. FINDINGS: CT HEAD FINDINGS Brain: Mild chronic ischemic white matter disease is noted. Large calcified left parietal meningioma is noted. No mass effect or midline shift is noted. No hemorrhage or acute infarction is noted. Ventricular size is within normal limits. Vascular: No hyperdense vessel or unexpected calcification. Skull: Normal. Negative for fracture or focal lesion. Sinuses/Orbits: No acute finding. Other: None. CT CERVICAL SPINE FINDINGS Alignment: Minimal grade 1 anterolisthesis of C5-6 is noted secondary to posterior facet joint hypertrophy. Skull base and vertebrae: No acute fracture. No primary bone lesion or focal pathologic process. Soft tissues and spinal canal: No prevertebral fluid or swelling. No visible canal hematoma. Disc levels: Mild degenerative disc disease is noted at C5-6 and C6-7. Upper chest: Negative. Other: Degenerative changes are seen involving the right-sided posterior facet joints. IMPRESSION: Mild chronic ischemic white matter disease. Large calcified left parietal meningioma. No acute intracranial abnormality seen. Mild multilevel degenerative disc disease. No acute abnormality seen in the cervical spine. Electronically Signed   By: Marijo Conception M.D.   On: 09/23/2019 14:14   Dg Hip Unilat W Or Wo Pelvis 2-3 Views Right  Result Date: 09/23/2019 CLINICAL DATA:  Right hip pain after fall. EXAM: DG HIP (WITH OR WITHOUT PELVIS) 2-3V RIGHT COMPARISON:  None. FINDINGS: Severely displaced and comminuted fracture is seen involving the intertrochanteric region of the proximal right femur. Vascular calcifications are noted. Right femoral head remains within the acetabulum. IMPRESSION: Severely displaced and comminuted intertrochanteric fracture of proximal right femur. Electronically Signed   By: Marijo Conception  M.D.   On: 09/23/2019 15:53    Procedures Procedures (including critical care time)  Medications Ordered in ED Medications  feeding supplement (ENSURE PRE-SURGERY) liquid 296 mL ( Oral Canceled Entry 09/24/19 0203)  povidone-iodine 10 % swab 2 application (has no administration in time range)  ceFAZolin (ANCEF) IVPB 2g/100 mL premix (has no administration in time range)  atenolol (TENORMIN) tablet 50 mg ( Oral Automatically Held 10/09/19 2200)  atorvastatin (LIPITOR) tablet 10 mg ( Oral Automatically Held 10/09/19 2200)  flecainide (TAMBOCOR) tablet 100 mg ( Oral Automatically Held 10/09/19 2200)  hydrALAZINE (APRESOLINE) tablet 10 mg ( Oral Automatically Held 10/09/19 2200)  influenza vaccine adjuvanted (FLUAD) injection 0.5 mL (has no administration in time range)  mupirocin ointment (BACTROBAN) 2 % 1 application ( Nasal Automatically Held 09/28/19 1000)  morphine 2 MG/ML injection 2 mg ( Intravenous MAR Hold 09/24/19 0655)  ondansetron (ZOFRAN) injection 4 mg (4 mg Intravenous Given 09/23/19 1322)  morphine 4 MG/ML injection 4 mg (4 mg Intravenous Given 09/23/19 1322)  fentaNYL (SUBLIMAZE) injection 100 mcg (100 mcg Intravenous Given 09/23/19 2132)  ondansetron (ZOFRAN) injection 4 mg (4 mg Intravenous Given 09/23/19 2130)  chlorhexidine (HIBICLENS) 4 % liquid 4 application (4 application Topical Given 09/24/19 0203)  propofol (DIPRIVAN) 10 mg/mL bolus/IV push (has no administration in time range)  fentaNYL (SUBLIMAZE) 250 MCG/5ML injection (has no administration in time range)  midazolam (VERSED) 2 MG/2ML injection (has no administration  in time range)  lidocaine 20 MG/ML injection (has no administration in time range)  rocuronium bromide 100 MG/10ML SOSY (has no administration in time range)     Initial Impression / Assessment and Plan / ED Course  I have reviewed the triage vital signs and the nursing notes.  Pertinent labs & imaging results that were available during my care  of the patient were reviewed by me and considered in my medical decision making (see chart for details).        85 year old female past medical story of proximal A. fib, chronic kidney disease who presents for evaluation of right lower extremity pain after mechanical fall.  Patient reports that she tripped, causing her fall.  No preceding chest pain, dizziness.  She does report she had the back of her head but does not think she had any LOC.  She is on Eliquis. Patient is afebrile, non-toxic appearing, sitting comfortably on examination table. Vital signs reviewed and stable.  Patient is neurovascularly intact.  Concern for hip fracture as she has rotation shortening of right lower extremity.  Additionally, given that she is on Eliquis, will plan for CT head for evaluation of any acute intracranial abnormality.  Plan check preop labs, EKG.  BMP shows BUN of 21, creatinine 1.41.  White blood cell count is 8.9.  Hemoglobin is 11.4.  CT head shows no acute abnormality.  CT C-spine negative for any acute bony abnormality.  X-rays of back show that she has new compression deformities noted at T4, T5 and T7 concerning for fractures of indeterminate age.  She has similar-appearing fractures on L1.  Hip x-ray shows severely displaced and comminuted intratrochanteric fracture of proximal right femur.  Discussed with Hilbert Odor, PA-C (Ortho).  We will plan for surgical repair of hip tomorrow.  Recommends medical admission.  Portions of this note were generated with [employer]. Dictation errors may occur despite best attempts at proofreading.   Final Clinical Impressions(s) / ED Diagnoses   Final diagnoses:  Closed displaced intertrochanteric fracture of right femur, initial encounter ([place])  Compression fracture of lumbar vertebra, initial encounter, unspecified lumbar vertebral level ([place])  Compression fracture of thoracic vertebra, initial encounter, unspecified thoracic vertebral  level [facility])    ED Discharge Orders    None       Desma Mcgregor 09/24/19 7116    Elnora Morrison, MD 09/24/19 2342

## 2019-09-23 NOTE — Progress Notes (Deleted)
Cardiology Office Note:    Date:  09/23/2019   ID:  Christina Morgan, DOB July 24, 1933, MRN [id number]  PCP:  Christina Carol, MD  Cardiologist:  Christina Him, MD *** Electrophysiologist:  None   Referring MD: Christina Carol, MD   No chief complaint on file. ***  History of Present Illness:    Christina Morgan is a 85 y.o. female with:  ***  Christina Morgan ***her home health nurse called in last week with weight gain and persistent leg swelling.  Prior CV studies:   The following studies were reviewed today:  ***  Past Medical History:  Diagnosis Date  . Cancer [facility])    endometrial ca  . Chronic diastolic CHF (congestive heart failure) ([place])   . Chronic edema   . CKD (chronic kidney disease), stage III   . Dyslipidemia   . Fracture of left humerus   . History of cardiovascular stress test    Lexiscan Myoview (06/2014): No ischemia or scar, not gated, low risk  . Hx of echocardiogram    Echo (05/02/14): EF 60% to 65%. No regional wall motion abnormalities. Mild AI, mildly dilated aortic root (37 mm), trivial MR, trivial TR  . Hyperlipidemia   . Hypertension   . Lipoma of skin    multiple  . Osteopenia   . PAF (paroxysmal atrial fibrillation) ([place])    failed DCCV 05/2014 >> Flecainide started >> DCCV 7/15 sucessful;  f/u ETT neg for pro-arrhythmia >> recurrent AF/AFL >> Flecainide inc to 100 bid with repeat DCCV 9/15   Surgical Hx: The patient  has a past surgical history that includes Colonoscopy (2008); Robotic assisted supracervical hysterectomy with bilateral salpingo oophorectomy (11/06/2012); Cardioversion (N/A, 06/05/2014); Cardioversion (N/A, 07/11/2014); Cardioversion (N/A, 09/04/2014); Colonoscopy (N/A, 05/20/2015); IR KYPHO EA ADDL LEVEL THORACIC OR LUMBAR (04/12/2019); and IR KYPHO THORACIC WITH BONE BIOPSY (04/12/2019).   Current Medications: No outpatient medications have been marked as taking for the 09/24/19 encounter (Appointment) with Richardson Dopp T, PA-C.      Allergies:   Penicillins   Social History   Tobacco Use  . Smoking status: Never Smoker  . Smokeless tobacco: Never Used  Substance Use Topics  . Alcohol use: No  . Drug use: No     Family Hx: The patient's family history includes Cancer (age of onset: 45) in her father; Coronary artery disease in her mother; Heart attack in her mother.  ROS:   Please see the history of present illness.    ROS All other systems reviewed and are negative.   EKGs/Labs/Other Test Reviewed:    EKG:  EKG is *** ordered today.  The ekg ordered today demonstrates ***  Recent Labs: 01/04/2019: NT-Pro BNP 593; TSH 2.960 05/07/2019: BNP 186.6 09/23/2019: BUN 21; Creatinine, Ser 1.49; Hemoglobin 11.4; Platelets 226; Potassium 3.8; Sodium 141   Recent Lipid Panel Lab Results  Component Value Date/Time   CHOL 175 03/21/2016 09:15 AM   TRIG 155 (H) 03/21/2016 09:15 AM   HDL 63 03/21/2016 09:15 AM   CHOLHDL 2.8 03/21/2016 09:15 AM   LDLCALC 81 03/21/2016 09:15 AM    Physical Exam:    VS:  There were no vitals taken for this visit.    Wt Readings from Last 3 Encounters:  07/04/19 176 lb 3.2 oz (79.9 kg)  06/21/19 175 lb (79.4 kg)  06/07/19 174 lb 12.8 oz (79.3 kg)     ***Physical Exam  ASSESSMENT & PLAN:    ***  Dispo:  No follow-ups on  file.   Medication Adjustments/Labs and Tests Ordered: Current medicines are reviewed at length with the patient today.  Concerns regarding medicines are outlined above.  Tests Ordered: No orders of the defined types were placed in this encounter.  Medication Changes: No orders of the defined types were placed in this encounter.   Signed, Richardson Dopp, PA-C  09/23/2019 5:18 PM    [facility] [address], [place], [place]  [address] Phone: [phone]; Fax: [phone]

## 2019-09-23 NOTE — H&P (Addendum)
History and Physical    Christina Morgan:[id number] DOB: June 12, 1933 DOA: 09/23/2019  PCP: Seward Carol, MD Patient coming from: home  Chief Complaint: fall  HPI: Christina Morgan is a 85 y.o. female with medical history significant of chronic congestive heart failure, CKD stage III, lymphedema, pretension, hyperlipidemia and Paroxysmal atrial fibrillation later status post fall.  Patient lives alone at home.  According to the daughter she was in her usual state of health until 930 this morning when she spoke to her daughter on the phone and she thinks patient fell somewhere between 1030 and 48.  Patient does not know how she fell but she denies loss of consciousness chest pain shortness of breath nausea vomiting abdominal pain diarrhea urinary complaints headache changes in vision.  Patient has been gaining weight progressively she gained weight from 1 76-1 81 gradually.  ED Course: Labs in the ED sodium 141 potassium 3.8 BUN 21 creatinine 1.49 white count 8.9 hemoglobin 11.4 platelet count 226.  Covid is pending.  Echocardiogram February 2020 with normal ejection fraction.  X-ray thoracic spine status post T11 and T12 kyphoplasty new compression deformities noted T4-T5 and T7 concerning for fractures of indeterminate age, L1 compression fracture  Review of Systems: As per HPI otherwise all other systems reviewed and are negative  Ambulatory Status: Patient walks with a walker at home  Past Medical History:  Diagnosis Date   Cancer ([place])    endometrial ca   Chronic diastolic CHF (congestive heart failure) ([facility])    Chronic edema    CKD (chronic kidney disease), stage III    Dyslipidemia    Fracture of left humerus    History of cardiovascular stress test    Lexiscan Myoview (06/2014): No ischemia or scar, not gated, low risk   Hx of echocardiogram    Echo (05/02/14): EF 60% to 65%. No regional wall motion abnormalities. Mild AI, mildly dilated aortic root (37 mm),  trivial MR, trivial TR   Hyperlipidemia    Hypertension    Lipoma of skin    multiple   Osteopenia    PAF (paroxysmal atrial fibrillation) ([place])    failed DCCV 05/2014 >> Flecainide started >> DCCV 7/15 sucessful;  f/u ETT neg for pro-arrhythmia >> recurrent AF/AFL >> Flecainide inc to 100 bid with repeat DCCV 9/15    Past Surgical History:  Procedure Laterality Date   CARDIOVERSION N/A 06/05/2014   Procedure: CARDIOVERSION;  Surgeon: Sueanne Margarita, MD;  Location: [facility];  Service: Cardiovascular;  Laterality: N/A;   CARDIOVERSION N/A 07/11/2014   Procedure: CARDIOVERSION;  Surgeon: Sueanne Margarita, MD;  Location: [facility];  Service: Cardiovascular;  Laterality: N/A;   CARDIOVERSION N/A 09/04/2014   Procedure: CARDIOVERSION;  Surgeon: Sueanne Margarita, MD;  Location[facility] ENDOSCOPY;  Service: Cardiovascular;  Laterality: N/A;   COLONOSCOPY  2008   COLONOSCOPY N/A 05/20/2015   Procedure: COLONOSCOPY;  Surgeon: Ronald Lobo, MD;  Location: [facility] ENDOSCOPY;  Service: Endoscopy;  Laterality: N/A;   IR KYPHO EA ADDL LEVEL THORACIC OR LUMBAR  04/12/2019   IR KYPHO THORACIC WITH BONE BIOPSY  04/12/2019   ROBOTIC ASSISTED SUPRACERVICAL HYSTERECTOMY WITH BILATERAL SALPINGO OOPHERECTOMY  11/06/2012   and bilateral pelvic lymph node dissection    Social History   Socioeconomic History   Marital status: Widowed    Spouse name: Not on file   Number of children: Not on file   Years of education: Not on file   Highest education level: Not on file  Occupational History   Not on file  Social Needs   Financial resource strain: Not on file   Food insecurity    Worry: Not on file    Inability: Not on file   Transportation needs    Medical: Not on file    Non-medical: Not on file  Tobacco Use   Smoking status: Never Smoker   Smokeless tobacco: Never Used  Substance and Sexual Activity   Alcohol use: No   Drug use: No   Sexual activity: Not on file  Lifestyle    Physical activity    Days per week: Not on file    Minutes per session: Not on file   Stress: Not on file  Relationships   Social connections    Talks on phone: Not on file    Gets together: Not on file    Attends religious service: Not on file    Active member of club or organization: Not on file    Attends meetings of clubs or organizations: Not on file    Relationship status: Not on file   Intimate partner violence    Fear of current or ex partner: Not on file    Emotionally abused: Not on file    Physically abused: Not on file    Forced sexual activity: Not on file  Other Topics Concern   Not on file  Social History Narrative   Not on file    Allergies  Allergen Reactions   Penicillins Other (See Comments)    Dizziness Has patient had a PCN reaction causing immediate rash, facial/tongue/throat swelling, SOB or lightheadedness with hypotension: No Has patient had a PCN reaction causing severe rash involving mucus membranes or skin necrosis: No Has patient had a PCN reaction that required hospitalization: No Has patient had a PCN reaction occurring within the last 10 years: No If all of the above answers are "NO", then may proceed with Cephalosporin use.    Family History  Problem Relation Age of Onset   Cancer Father 44       metastatic oropharyngeal ca   Heart attack Mother    Coronary artery disease Mother      Prior to Admission medications   Medication Sig Start Date End Date Taking? Authorizing Provider  apixaban (ELIQUIS) 5 MG TABS tablet Take 5 mg by mouth 2 (two) times daily.   Yes [provider]  atenolol (TENORMIN) 50 MG tablet TAKE 1 TABLET BY MOUTH TWICE DAILY Patient taking differently: Take 50 mg by mouth 2 (two) times daily.  05/02/19  Yes Turner, Eber Hong, MD  atorvastatin (LIPITOR) 10 MG tablet Take 10 mg by mouth at bedtime.  10/04/12  Yes [provider]  flecainide (TAMBOCOR) 100 MG tablet TAKE 1 TABLET BY MOUTH TWICE  DAILY Patient taking differently: Take 100 mg by mouth 2 (two) times daily.  08/22/19  Yes Turner, Eber Hong, MD  hydrALAZINE (APRESOLINE) 10 MG tablet Take 1 tablet (10 mg total) by mouth 2 (two) times a day. 06/21/19 06/15/20 Yes Turner, Eber Hong, MD  megestrol (MEGACE) 40 MG tablet Take 80 mg by mouth 2 (two) times daily. Take for 3 weeks , then alternate with tamoxifen 20 mg 1 daily for 3 weeks, etc.   Yes [provider]  potassium chloride SA (K-DUR) 20 MEQ tablet Take 1 tablet (20 mEq total) by mouth 2 (two) times daily. 08/08/19  Yes Turner, Eber Hong, MD  tamoxifen (NOLVADEX) 20 MG tablet Take 20 mg by  mouth 2 (two) times daily. Alternate every 3 weeks with megestrol. 02/07/19  Yes [provider]  torsemide (DEMADEX) 20 MG tablet Take 2 tablets (40 mg total) by mouth 2 (two) times daily. 06/21/19 06/15/20 Yes Sueanne Margarita, MD    Physical Exam: Vitals:   09/23/19 1259 09/23/19 1307 09/23/19 1600  BP:  (!) 168/58 (!) 167/58  Pulse:  60 65  Resp:  (!) 21 18  Temp:  98.5 F (36.9 C)   TempSrc:  Oral   SpO2: 99% 96% 96%      General:  Appears calm and comfortable  Eyes: PERRL, EOMI, normal lids, iris  ENT: grossly normal hearing, lips & tongue, mmm  Neck:  no LAD, masses or thyromegaly  Cardiovascular: RRR, no m/r/g. No LE edema.   Respiratory: CTA bilaterally, no w/r/r. Normal respiratory effort.  Abdomen: soft, ntnd, NABS  Skin: no rash or induration seen on limited exam  Musculoskeletal:  grossly normal tone BUE/BLE, good ROM, no bony abnormality  Psychiatric:  grossly normal mood and affect, speech fluent and appropriate, AOx3  Neurologic: CN 2-12 grossly intact, moves all extremities in coordinated fashion, sensation intact  Labs on Admission: I have personally reviewed following labs and imaging studies  CBC: Recent Labs  Lab 09/23/19 1307  WBC 8.9  NEUTROABS 7.1  HGB 11.4*  HCT 36.3  MCV 100.0  PLT 854   Basic Metabolic Panel: Recent Labs    Lab 09/23/19 1307  NA 141  K 3.8  CL 104  CO2 28  GLUCOSE 118*  BUN 21  CREATININE 1.49*  CALCIUM 8.6*   GFR: CrCl cannot be calculated (Unknown ideal weight.). Liver Function Tests: No results for input(s): AST, ALT, ALKPHOS, BILITOT, PROT, ALBUMIN in the last 168 hours. No results for input(s): LIPASE, AMYLASE in the last 168 hours. No results for input(s): AMMONIA in the last 168 hours. Coagulation Profile: No results for input(s): INR, PROTIME in the last 168 hours. Cardiac Enzymes: No results for input(s): CKTOTAL, CKMB, CKMBINDEX, TROPONINI in the last 168 hours. BNP (last 3 results) Recent Labs    01/04/19 1306  PROBNP 593   HbA1C: No results for input(s): HGBA1C in the last 72 hours. CBG: No results for input(s): GLUCAP in the last 168 hours. Lipid Profile: No results for input(s): CHOL, HDL, LDLCALC, TRIG, CHOLHDL, LDLDIRECT in the last 72 hours. Thyroid Function Tests: No results for input(s): TSH, T4TOTAL, FREET4, T3FREE, THYROIDAB in the last 72 hours. Anemia Panel: No results for input(s): VITAMINB12, FOLATE, FERRITIN, TIBC, IRON, RETICCTPCT in the last 72 hours. Urine analysis:    Component Value Date/Time   COLORURINE YELLOW 06/19/2014 2131   APPEARANCEUR CLOUDY (A) 06/19/2014 2131   LABSPEC 1.035 (H) 06/19/2014 2131   PHURINE 6.0 06/19/2014 2131   GLUCOSEU NEGATIVE 06/19/2014 2131   HGBUR TRACE (A) 06/19/2014 2131   BILIRUBINUR NEGATIVE 06/19/2014 2131   KETONESUR NEGATIVE 06/19/2014 2131   PROTEINUR NEGATIVE 06/19/2014 2131   UROBILINOGEN 0.2 06/19/2014 2131   NITRITE NEGATIVE 06/19/2014 2131   LEUKOCYTESUR LARGE (A) 06/19/2014 2131    Creatinine Clearance: CrCl cannot be calculated (Unknown ideal weight.).  Sepsis Labs: @LABRCNTIP (procalcitonin:4,lacticidven:4) )No results found for this or any previous visit (from the past 240 hour(s)).   Radiological Exams on Admission: Dg Chest 1 View  Result Date: 09/23/2019 CLINICAL DATA:   Fall. EXAM: CHEST  1 VIEW COMPARISON:  March 30, 2018. FINDINGS: Mild cardiomegaly is noted. Atherosclerosis of thoracic aorta is noted. No pneumothorax is noted. Lungs  are clear. Bony thorax is unremarkable. IMPRESSION: No acute cardiopulmonary abnormality seen. Aortic Atherosclerosis (ICD10-I70.0). Electronically Signed   By: Marijo Conception M.D.   On: 09/23/2019 15:58   Dg Thoracic Spine 2 View  Result Date: 09/23/2019 CLINICAL DATA:  Back pain after fall. EXAM: THORACIC SPINE 2 VIEWS COMPARISON:  Apr 24, 2019. FINDINGS: Status post T11 and T12 kyphoplasty. New compression deformities are seen involving the T7, T5 and T4 vertebral bodies concerning for fractures of indeterminate age. No spondylolisthesis is noted. Diffuse osteopenia is noted. IMPRESSION: Status post T11 and T12 kyphoplasty. New compression deformities are noted at T4, T5 and T7 concerning for fractures of indeterminate age. MRI may be performed for further evaluation. Electronically Signed   By: Marijo Conception M.D.   On: 09/23/2019 15:55   Dg Lumbar Spine Complete  Result Date: 09/23/2019 CLINICAL DATA:  Low back pain after fall. EXAM: LUMBAR SPINE - COMPLETE 4+ VIEW COMPARISON:  December 18, 2017. FINDINGS: Mild levoscoliosis of lumbar spine is noted. Status post kyphoplasty of T11 and T12 vertebral bodies. Diffuse osteopenia is noted. Severe compression deformity of L1 vertebral body is noted concerning for fracture of indeterminate age. Mild degenerative disc disease is noted at L2-3, L3-4 and L4-5. IMPRESSION: Interval development of compression deformity of L1 vertebral body concerning for fracture of indeterminate age. MRI may be performed to evaluate for acute fracture. Multilevel degenerative disc disease is noted. Diffuse osteopenia. Electronically Signed   By: Marijo Conception M.D.   On: 09/23/2019 15:57   Ct Head Wo Contrast  Result Date: 09/23/2019 CLINICAL DATA:  Posttraumatic headache after fall. No loss of  consciousness. EXAM: CT HEAD WITHOUT CONTRAST CT CERVICAL SPINE WITHOUT CONTRAST TECHNIQUE: Multidetector CT imaging of the head and cervical spine was performed following the standard protocol without intravenous contrast. Multiplanar CT image reconstructions of the cervical spine were also generated. COMPARISON:  None. FINDINGS: CT HEAD FINDINGS Brain: Mild chronic ischemic white matter disease is noted. Large calcified left parietal meningioma is noted. No mass effect or midline shift is noted. No hemorrhage or acute infarction is noted. Ventricular size is within normal limits. Vascular: No hyperdense vessel or unexpected calcification. Skull: Normal. Negative for fracture or focal lesion. Sinuses/Orbits: No acute finding. Other: None. CT CERVICAL SPINE FINDINGS Alignment: Minimal grade 1 anterolisthesis of C5-6 is noted secondary to posterior facet joint hypertrophy. Skull base and vertebrae: No acute fracture. No primary bone lesion or focal pathologic process. Soft tissues and spinal canal: No prevertebral fluid or swelling. No visible canal hematoma. Disc levels: Mild degenerative disc disease is noted at C5-6 and C6-7. Upper chest: Negative. Other: Degenerative changes are seen involving the right-sided posterior facet joints. IMPRESSION: Mild chronic ischemic white matter disease. Large calcified left parietal meningioma. No acute intracranial abnormality seen. Mild multilevel degenerative disc disease. No acute abnormality seen in the cervical spine. Electronically Signed   By: Marijo Conception M.D.   On: 09/23/2019 14:14   Ct Cervical Spine Wo Contrast  Result Date: 09/23/2019 CLINICAL DATA:  Posttraumatic headache after fall. No loss of consciousness. EXAM: CT HEAD WITHOUT CONTRAST CT CERVICAL SPINE WITHOUT CONTRAST TECHNIQUE: Multidetector CT imaging of the head and cervical spine was performed following the standard protocol without intravenous contrast. Multiplanar CT image reconstructions of the  cervical spine were also generated. COMPARISON:  None. FINDINGS: CT HEAD FINDINGS Brain: Mild chronic ischemic white matter disease is noted. Large calcified left parietal meningioma is noted. No mass effect or midline shift  is noted. No hemorrhage or acute infarction is noted. Ventricular size is within normal limits. Vascular: No hyperdense vessel or unexpected calcification. Skull: Normal. Negative for fracture or focal lesion. Sinuses/Orbits: No acute finding. Other: None. CT CERVICAL SPINE FINDINGS Alignment: Minimal grade 1 anterolisthesis of C5-6 is noted secondary to posterior facet joint hypertrophy. Skull base and vertebrae: No acute fracture. No primary bone lesion or focal pathologic process. Soft tissues and spinal canal: No prevertebral fluid or swelling. No visible canal hematoma. Disc levels: Mild degenerative disc disease is noted at C5-6 and C6-7. Upper chest: Negative. Other: Degenerative changes are seen involving the right-sided posterior facet joints. IMPRESSION: Mild chronic ischemic white matter disease. Large calcified left parietal meningioma. No acute intracranial abnormality seen. Mild multilevel degenerative disc disease. No acute abnormality seen in the cervical spine. Electronically Signed   By: Marijo Conception M.D.   On: 09/23/2019 14:14   Dg Hip Unilat W Or Wo Pelvis 2-3 Views Right  Result Date: 09/23/2019 CLINICAL DATA:  Right hip pain after fall. EXAM: DG HIP (WITH OR WITHOUT PELVIS) 2-3V RIGHT COMPARISON:  None. FINDINGS: Severely displaced and comminuted fracture is seen involving the intertrochanteric region of the proximal right femur. Vascular calcifications are noted. Right femoral head remains within the acetabulum. IMPRESSION: Severely displaced and comminuted intertrochanteric fracture of proximal right femur. Electronically Signed   By: Marijo Conception M.D.   On: 09/23/2019 15:53    EKG: Ordered pending  Assessment/Plan Active Problems:   Fall    #1 right  hip fracture patient is not sure how she fell.  She thinks she did not have presyncope but her daughter is not sure if he can have the syncope.  However I will admitted to telemetry get EKG get troponin.patient has history of chf and is on torsemide.she is at moderate risk for adverse cardiac outcome.  Patient has been gaining weight and had an appointment to see the cardiologist tomorrow.  I will consult cardiology for preop clearance.  #2 history of chronic atrial fibrillation patient is on Eliquis, metoprolol, flecainide.  Hold Eliquis for surgery.  Continue rate control.  Continue hydralazine  #3 history of diastolic heart failure with normal ejection fraction she is on Demadex at home she appears more on the dry side with clear chest x-ray I will hold her Demadex as he is n.p.o.  #4 hypertension continue hydralazine atenolol    Estimated body mass index is 29.32 kg/m as calculated from the following:   Height as of 07/04/19: 5\' 5"  (1.651 m).   Weight as of 07/04/19: 79.9 kg.   DVT prophylaxis: SCD she is going for surgery tomorrow  code Status: Full code Family Communication: Discussed with daughter Kahliya disposition Plan: Pending surgery  consults called: Patient added to Dr. Theodosia Blender list: Preop clearance tomorrow Admission status: Inpatient   Georgette Shell MD [facility]  If 7PM-7AM, please contact night-coverage [email or web address] Password [email or web address]  09/23/2019, 5:59 PM

## 2019-09-23 NOTE — ED Notes (Signed)
Currently patient does not want nausea or pain medication at this time.

## 2019-09-23 NOTE — ED Notes (Signed)
Ortho provider at  bedside ?

## 2019-09-23 NOTE — Consult Note (Signed)
Reason for Consult:Right hip fx Referring Physician: Rosalyn Gess  Christina Morgan is an 85 y.o. female.  HPI: Christina Morgan was at home and fell. She's not really sure of the circumstances but says she didn't pass out. She was supposedly ambulating with her RW as usual though it was some distance away from where she fell so family/pt not quite sure. She had immediate right hip pain and could not get up. She lives alone but was able to call for help and was brought to the ED. X-rays showed a right hip fx and orthopedic surgery was consulted.   Past Medical History:  Diagnosis Date  . Cancer [facility])    endometrial ca  . Chronic diastolic CHF (congestive heart failure) ([place])   . Chronic edema   . CKD (chronic kidney disease), stage III   . Dyslipidemia   . Fracture of left humerus   . History of cardiovascular stress test    Lexiscan Myoview (06/2014): No ischemia or scar, not gated, low risk  . Hx of echocardiogram    Echo (05/02/14): EF 60% to 65%. No regional wall motion abnormalities. Mild AI, mildly dilated aortic root (37 mm), trivial MR, trivial TR  . Hyperlipidemia   . Hypertension   . Lipoma of skin    multiple  . Osteopenia   . PAF (paroxysmal atrial fibrillation) ([place])    failed DCCV 05/2014 >> Flecainide started >> DCCV 7/15 sucessful;  f/u ETT neg for pro-arrhythmia >> recurrent AF/AFL >> Flecainide inc to 100 bid with repeat DCCV 9/15    Past Surgical History:  Procedure Laterality Date  . CARDIOVERSION N/A 06/05/2014   Procedure: CARDIOVERSION;  Surgeon: Sueanne Margarita, MD;  Location: [place];  Service: Cardiovascular;  Laterality: N/A;  . CARDIOVERSION N/A 07/11/2014   Procedure: CARDIOVERSION;  Surgeon: Sueanne Margarita, MD;  Location: [facility] ENDOSCOPY;  Service: Cardiovascular;  Laterality: N/A;  . CARDIOVERSION N/A 09/04/2014   Procedure: CARDIOVERSION;  Surgeon: Sueanne Margarita, MD;  Location: [facility] ENDOSCOPY;  Service: Cardiovascular;  Laterality: N/A;  . COLONOSCOPY  2008  .  COLONOSCOPY N/A 05/20/2015   Procedure: COLONOSCOPY;  Surgeon: Ronald Lobo, MD;  Location: [facility] ENDOSCOPY;  Service: Endoscopy;  Laterality: N/A;  . IR KYPHO EA ADDL LEVEL THORACIC OR LUMBAR  04/12/2019  . IR KYPHO THORACIC WITH BONE BIOPSY  04/12/2019  . ROBOTIC ASSISTED SUPRACERVICAL HYSTERECTOMY WITH BILATERAL SALPINGO OOPHERECTOMY  11/06/2012   and bilateral pelvic lymph node dissection    Family History  Problem Relation Age of Onset  . Cancer Father 23       metastatic oropharyngeal ca  . Heart attack Mother   . Coronary artery disease Mother     Social History:  reports that she has never smoked. She has never used smokeless tobacco. She reports that she does not drink alcohol or use drugs.  Allergies:  Allergies  Allergen Reactions  . Penicillins Other (See Comments)    Dizziness Has patient had a PCN reaction causing immediate rash, facial/tongue/throat swelling, SOB or lightheadedness with hypotension: No Has patient had a PCN reaction causing severe rash involving mucus membranes or skin necrosis: No Has patient had a PCN reaction that required hospitalization: No Has patient had a PCN reaction occurring within the last 10 years: No If all of the above answers are "NO", then may proceed with Cephalosporin use.    Medications: I have reviewed the patient's current medications.  Results for orders placed or performed during the hospital encounter of 09/23/19 (  from the past 48 hour(s))  CBC with Differential     Status: Abnormal   Collection Time: 09/23/19  1:07 PM  Result Value Ref Range   WBC 8.9 4.0 - 10.5 K/uL   RBC 3.63 (L) 3.87 - 5.11 MIL/uL   Hemoglobin 11.4 (L) 12.0 - 15.0 g/dL   HCT 36.3 36.0 - 46.0 %   MCV 100.0 80.0 - 100.0 fL   MCH 31.4 26.0 - 34.0 pg   MCHC 31.4 30.0 - 36.0 g/dL   RDW 13.6 11.5 - 15.5 %   Platelets 226 150 - 400 K/uL   nRBC 0.0 0.0 - 0.2 %   Neutrophils Relative % 80 %   Neutro Abs 7.1 1.7 - 7.7 K/uL   Lymphocytes Relative 10 %    Lymphs Abs 0.9 0.7 - 4.0 K/uL   Monocytes Relative 9 %   Monocytes Absolute 0.8 0.1 - 1.0 K/uL   Eosinophils Relative 0 %   Eosinophils Absolute 0.0 0.0 - 0.5 K/uL   Basophils Relative 0 %   Basophils Absolute 0.0 0.0 - 0.1 K/uL   Immature Granulocytes 1 %   Abs Immature Granulocytes 0.06 0.00 - 0.07 K/uL    Comment: Performed at [facility], [address] [place], [place] [address]  Basic metabolic panel     Status: Abnormal   Collection Time: 09/23/19  1:07 PM  Result Value Ref Range   Sodium 141 135 - 145 mmol/L   Potassium 3.8 3.5 - 5.1 mmol/L   Chloride 104 98 - 111 mmol/L   CO2 28 22 - 32 mmol/L   Glucose, Bld 118 (H) 70 - 99 mg/dL   BUN 21 8 - 23 mg/dL   Creatinine, Ser 1.49 (H) 0.44 - 1.00 mg/dL   Calcium 8.6 (L) 8.9 - 10.3 mg/dL   GFR calc non Af Amer 32 (L) >60 mL/min   GFR calc Af Amer 37 (L) >60 mL/min   Anion gap 9 5 - 15    Comment: Performed at [facility] [address] [place], [place] [address]    Dg Chest 1 View  Result Date: 09/23/2019 CLINICAL DATA:  Fall. EXAM: CHEST  1 VIEW COMPARISON:  March 30, 2018. FINDINGS: Mild cardiomegaly is noted. Atherosclerosis of thoracic aorta is noted. No pneumothorax is noted. Lungs are clear. Bony thorax is unremarkable. IMPRESSION: No acute cardiopulmonary abnormality seen. Aortic Atherosclerosis (ICD10-I70.0). Electronically Signed   By: Marijo Conception M.D.   On: 09/23/2019 15:58   Dg Thoracic Spine 2 View  Result Date: 09/23/2019 CLINICAL DATA:  Back pain after fall. EXAM: THORACIC SPINE 2 VIEWS COMPARISON:  Apr 24, 2019. FINDINGS: Status post T11 and T12 kyphoplasty. New compression deformities are seen involving the T7, T5 and T4 vertebral bodies concerning for fractures of indeterminate age. No spondylolisthesis is noted. Diffuse osteopenia is noted. IMPRESSION: Status post T11 and T12 kyphoplasty. New compression deformities are noted at T4, T5 and T7 concerning for fractures of indeterminate age. MRI  may be performed for further evaluation. Electronically Signed   By: Marijo Conception M.D.   On: 09/23/2019 15:55   Dg Lumbar Spine Complete  Result Date: 09/23/2019 CLINICAL DATA:  Low back pain after fall. EXAM: LUMBAR SPINE - COMPLETE 4+ VIEW COMPARISON:  December 18, 2017. FINDINGS: Mild levoscoliosis of lumbar spine is noted. Status post kyphoplasty of T11 and T12 vertebral bodies. Diffuse osteopenia is noted. Severe compression deformity of L1 vertebral body is noted concerning for fracture  of indeterminate age. Mild degenerative disc disease is noted at L2-3, L3-4 and L4-5. IMPRESSION: Interval development of compression deformity of L1 vertebral body concerning for fracture of indeterminate age. MRI may be performed to evaluate for acute fracture. Multilevel degenerative disc disease is noted. Diffuse osteopenia. Electronically Signed   By: Marijo Conception M.D.   On: 09/23/2019 15:57   Ct Head Wo Contrast  Result Date: 09/23/2019 CLINICAL DATA:  Posttraumatic headache after fall. No loss of consciousness. EXAM: CT HEAD WITHOUT CONTRAST CT CERVICAL SPINE WITHOUT CONTRAST TECHNIQUE: Multidetector CT imaging of the head and cervical spine was performed following the standard protocol without intravenous contrast. Multiplanar CT image reconstructions of the cervical spine were also generated. COMPARISON:  None. FINDINGS: CT HEAD FINDINGS Brain: Mild chronic ischemic white matter disease is noted. Large calcified left parietal meningioma is noted. No mass effect or midline shift is noted. No hemorrhage or acute infarction is noted. Ventricular size is within normal limits. Vascular: No hyperdense vessel or unexpected calcification. Skull: Normal. Negative for fracture or focal lesion. Sinuses/Orbits: No acute finding. Other: None. CT CERVICAL SPINE FINDINGS Alignment: Minimal grade 1 anterolisthesis of C5-6 is noted secondary to posterior facet joint hypertrophy. Skull base and vertebrae: No acute  fracture. No primary bone lesion or focal pathologic process. Soft tissues and spinal canal: No prevertebral fluid or swelling. No visible canal hematoma. Disc levels: Mild degenerative disc disease is noted at C5-6 and C6-7. Upper chest: Negative. Other: Degenerative changes are seen involving the right-sided posterior facet joints. IMPRESSION: Mild chronic ischemic white matter disease. Large calcified left parietal meningioma. No acute intracranial abnormality seen. Mild multilevel degenerative disc disease. No acute abnormality seen in the cervical spine. Electronically Signed   By: Marijo Conception M.D.   On: 09/23/2019 14:14   Ct Cervical Spine Wo Contrast  Result Date: 09/23/2019 CLINICAL DATA:  Posttraumatic headache after fall. No loss of consciousness. EXAM: CT HEAD WITHOUT CONTRAST CT CERVICAL SPINE WITHOUT CONTRAST TECHNIQUE: Multidetector CT imaging of the head and cervical spine was performed following the standard protocol without intravenous contrast. Multiplanar CT image reconstructions of the cervical spine were also generated. COMPARISON:  None. FINDINGS: CT HEAD FINDINGS Brain: Mild chronic ischemic white matter disease is noted. Large calcified left parietal meningioma is noted. No mass effect or midline shift is noted. No hemorrhage or acute infarction is noted. Ventricular size is within normal limits. Vascular: No hyperdense vessel or unexpected calcification. Skull: Normal. Negative for fracture or focal lesion. Sinuses/Orbits: No acute finding. Other: None. CT CERVICAL SPINE FINDINGS Alignment: Minimal grade 1 anterolisthesis of C5-6 is noted secondary to posterior facet joint hypertrophy. Skull base and vertebrae: No acute fracture. No primary bone lesion or focal pathologic process. Soft tissues and spinal canal: No prevertebral fluid or swelling. No visible canal hematoma. Disc levels: Mild degenerative disc disease is noted at C5-6 and C6-7. Upper chest: Negative. Other:  Degenerative changes are seen involving the right-sided posterior facet joints. IMPRESSION: Mild chronic ischemic white matter disease. Large calcified left parietal meningioma. No acute intracranial abnormality seen. Mild multilevel degenerative disc disease. No acute abnormality seen in the cervical spine. Electronically Signed   By: Marijo Conception M.D.   On: 09/23/2019 14:14   Dg Hip Unilat W Or Wo Pelvis 2-3 Views Right  Result Date: 09/23/2019 CLINICAL DATA:  Right hip pain after fall. EXAM: DG HIP (WITH OR WITHOUT PELVIS) 2-3V RIGHT COMPARISON:  None. FINDINGS: Severely displaced and comminuted fracture is seen involving  the intertrochanteric region of the proximal right femur. Vascular calcifications are noted. Right femoral head remains within the acetabulum. IMPRESSION: Severely displaced and comminuted intertrochanteric fracture of proximal right femur. Electronically Signed   By: Marijo Conception M.D.   On: 09/23/2019 15:53    Review of Systems  Constitutional: Negative for weight loss.  HENT: Negative for ear discharge, ear pain, hearing loss and tinnitus.   Eyes: Negative for blurred vision, double vision, photophobia and pain.  Respiratory: Negative for cough, sputum production and shortness of breath.   Cardiovascular: Negative for chest pain.  Gastrointestinal: Negative for abdominal pain, nausea and vomiting.  Genitourinary: Negative for dysuria, flank pain, frequency and urgency.  Musculoskeletal: Positive for joint pain (Right hip). Negative for back pain, falls, myalgias and neck pain.  Neurological: Negative for dizziness, tingling, sensory change, focal weakness, loss of consciousness and headaches.  Endo/Heme/Allergies: Does not bruise/bleed easily.  Psychiatric/Behavioral: Negative for depression, memory loss and substance abuse. The patient is not nervous/anxious.    Blood pressure (!) 167/58, pulse 65, temperature 98.5 F (36.9 C), temperature source Oral, resp. rate  18, SpO2 96 %. Physical Exam  Constitutional: She appears well-developed and well-nourished. No distress.  HENT:  Head: Normocephalic and atraumatic.  Eyes: Conjunctivae are normal. Right eye exhibits no discharge. Left eye exhibits no discharge. No scleral icterus.  Neck: Normal range of motion.  Cardiovascular: Normal rate and regular rhythm.  Respiratory: Effort normal. No respiratory distress.  Musculoskeletal:     Comments: RLE No traumatic wounds, ecchymosis, or rash  Hip mild TTP  No knee or ankle effusion  Knee stable to varus/ valgus and anterior/posterior stress  Sens DPN, SPN, TN intact  Motor EHL, ext, flex, evers 5/5  DP 0, PT 0 , Extensive lymphedema  Neurological: She is alert.  Skin: Skin is warm and dry. She is not diaphoretic.  Psychiatric: She has a normal mood and affect. Her behavior is normal.    Assessment/Plan: Right hip fx -- Plan IMN tomorrow AM by Dr. Marcelino Scot. NPO after MN.  Multiple medical problems including afib on Eliquis, HTN, HLD, CKD, CHF with BLE lymphedema, and osteopenia -- IM to admit, clear for surgery, and manage issues. Appreciate their help. Please hold Eliquis until after surgery.    Lisette Abu, PA-C Orthopedic Surgery [phone] 09/23/2019, 4:07 PM

## 2019-09-24 ENCOUNTER — Inpatient Hospital Stay (HOSPITAL_COMMUNITY): Payer: Medicare Other | Admitting: Anesthesiology

## 2019-09-24 ENCOUNTER — Inpatient Hospital Stay (HOSPITAL_COMMUNITY): Payer: Medicare Other

## 2019-09-24 ENCOUNTER — Ambulatory Visit: Payer: Medicare Other | Admitting: Physician Assistant

## 2019-09-24 ENCOUNTER — Encounter (HOSPITAL_COMMUNITY): Payer: Self-pay | Admitting: Certified Registered"

## 2019-09-24 ENCOUNTER — Encounter (HOSPITAL_COMMUNITY)
Admission: EM | Disposition: A | Discharge status: Rehabilitation Facility | Payer: Self-pay | Source: Home / Self Care | Attending: Internal Medicine

## 2019-09-24 DIAGNOSIS — S32000A Wedge compression fracture of unspecified lumbar vertebra, initial encounter for closed fracture: Secondary | ICD-10-CM

## 2019-09-24 DIAGNOSIS — Z01818 Encounter for other preprocedural examination: Secondary | ICD-10-CM

## 2019-09-24 DIAGNOSIS — S72141A Displaced intertrochanteric fracture of right femur, initial encounter for closed fracture: Secondary | ICD-10-CM

## 2019-09-24 DIAGNOSIS — I358 Other nonrheumatic aortic valve disorders: Secondary | ICD-10-CM

## 2019-09-24 DIAGNOSIS — I371 Nonrheumatic pulmonary valve insufficiency: Secondary | ICD-10-CM

## 2019-09-24 DIAGNOSIS — I519 Heart disease, unspecified: Secondary | ICD-10-CM

## 2019-09-24 DIAGNOSIS — S22000A Wedge compression fracture of unspecified thoracic vertebra, initial encounter for closed fracture: Secondary | ICD-10-CM

## 2019-09-24 HISTORY — PX: FEMUR IM NAIL: SHX1597

## 2019-09-24 LAB — CBC WITH DIFFERENTIAL/PLATELET
Abs Immature Granulocytes: 0.04 10*3/uL (ref 0.00–0.07)
Basophils Absolute: 0 10*3/uL (ref 0.0–0.1)
Basophils Relative: 0 %
Eosinophils Absolute: 0 10*3/uL (ref 0.0–0.5)
Eosinophils Relative: 0 %
HCT: 31.8 % — ABNORMAL LOW (ref 36.0–46.0)
Hemoglobin: 10.5 g/dL — ABNORMAL LOW (ref 12.0–15.0)
Immature Granulocytes: 1 %
Lymphocytes Relative: 10 %
Lymphs Abs: 0.8 10*3/uL (ref 0.7–4.0)
MCH: 32.6 pg (ref 26.0–34.0)
MCHC: 33 g/dL (ref 30.0–36.0)
MCV: 98.8 fL (ref 80.0–100.0)
Monocytes Absolute: 0.7 10*3/uL (ref 0.1–1.0)
Monocytes Relative: 9 %
Neutro Abs: 6.1 10*3/uL (ref 1.7–7.7)
Neutrophils Relative %: 80 %
Platelets: 197 10*3/uL (ref 150–400)
RBC: 3.22 MIL/uL — ABNORMAL LOW (ref 3.87–5.11)
RDW: 13.8 % (ref 11.5–15.5)
WBC: 7.6 10*3/uL (ref 4.0–10.5)
nRBC: 0 % (ref 0.0–0.2)

## 2019-09-24 LAB — ECHOCARDIOGRAM COMPLETE
Height: 66 in
Weight: 2853.63 oz

## 2019-09-24 LAB — COMPREHENSIVE METABOLIC PANEL
ALT: 13 U/L (ref 0–44)
AST: 21 U/L (ref 15–41)
Albumin: 2.8 g/dL — ABNORMAL LOW (ref 3.5–5.0)
Alkaline Phosphatase: 74 U/L (ref 38–126)
Anion gap: 11 (ref 5–15)
BUN: 23 mg/dL (ref 8–23)
CO2: 26 mmol/L (ref 22–32)
Calcium: 8.5 mg/dL — ABNORMAL LOW (ref 8.9–10.3)
Chloride: 105 mmol/L (ref 98–111)
Creatinine, Ser: 1.6 mg/dL — ABNORMAL HIGH (ref 0.44–1.00)
GFR calc Af Amer: 34 mL/min — ABNORMAL LOW (ref >60)
GFR calc non Af Amer: 29 mL/min — ABNORMAL LOW (ref >60)
Glucose, Bld: 118 mg/dL — ABNORMAL HIGH (ref 70–99)
Potassium: 4.6 mmol/L (ref 3.5–5.1)
Sodium: 142 mmol/L (ref 135–145)
Total Bilirubin: 1.2 mg/dL (ref 0.3–1.2)
Total Protein: 5.3 g/dL — ABNORMAL LOW (ref 6.5–8.1)

## 2019-09-24 LAB — TROPONIN I (HIGH SENSITIVITY): Troponin I (High Sensitivity): 9 ng/L (ref <18)

## 2019-09-24 LAB — MRSA PCR SCREENING: MRSA by PCR: POSITIVE — AB

## 2019-09-24 SURGERY — INSERTION, INTRAMEDULLARY ROD, FEMUR
Anesthesia: General | Site: Hip | Laterality: Right

## 2019-09-24 MED ORDER — APIXABAN 5 MG PO TABS: 5.0000 mg | ORAL_TABLET | Freq: Two times a day (BID) | ORAL | Status: DC

## 2019-09-24 MED ORDER — FENTANYL CITRATE (PF) 250 MCG/5ML IJ SOLN
INTRAMUSCULAR | Status: AC
  Filled 2019-09-24: qty 5

## 2019-09-24 MED ORDER — INFLUENZA VAC A&B SA ADJ QUAD 0.5 ML IM PRSY
0.5000 mL | PREFILLED_SYRINGE | INTRAMUSCULAR | Status: AC
  Administered 2019-09-26: 0.5 mL via INTRAMUSCULAR
  Filled 2019-09-24: qty 0.5

## 2019-09-24 MED ORDER — CHLORHEXIDINE GLUCONATE 4 % EX LIQD
60.0000 mL | Freq: Once | CUTANEOUS | Status: AC
  Administered 2019-09-24: 4 via TOPICAL

## 2019-09-24 MED ORDER — FENTANYL CITRATE (PF) 100 MCG/2ML IJ SOLN
INTRAMUSCULAR | Status: DC | PRN
  Administered 2019-09-24: 75 ug via INTRAVENOUS

## 2019-09-24 MED ORDER — DEXAMETHASONE SODIUM PHOSPHATE 10 MG/ML IJ SOLN
INTRAMUSCULAR | Status: DC | PRN
  Administered 2019-09-24: 4 mg via INTRAVENOUS

## 2019-09-24 MED ORDER — DEXAMETHASONE SODIUM PHOSPHATE 10 MG/ML IJ SOLN
INTRAMUSCULAR | Status: AC
  Filled 2019-09-24: qty 1

## 2019-09-24 MED ORDER — 0.9 % SODIUM CHLORIDE (POUR BTL) OPTIME
TOPICAL | Status: DC | PRN
  Administered 2019-09-24: 1000 mL

## 2019-09-24 MED ORDER — ONDANSETRON HCL 4 MG/2ML IJ SOLN
INTRAMUSCULAR | Status: DC | PRN
  Administered 2019-09-24: 4 mg via INTRAVENOUS

## 2019-09-24 MED ORDER — MUPIROCIN 2 % EX OINT
1.0000 "application " | TOPICAL_OINTMENT | Freq: Two times a day (BID) | CUTANEOUS | Status: DC
  Administered 2019-09-24 – 2019-09-27 (×8): 1 via NASAL
  Filled 2019-09-24 (×2): qty 22

## 2019-09-24 MED ORDER — PROPOFOL 10 MG/ML IV BOLUS
INTRAVENOUS | Status: DC | PRN
  Administered 2019-09-24: 150 mg via INTRAVENOUS

## 2019-09-24 MED ORDER — HYDROCODONE-ACETAMINOPHEN 5-325 MG PO TABS
1.0000 | ORAL_TABLET | Freq: Four times a day (QID) | ORAL | Status: DC | PRN
  Administered 2019-09-25 – 2019-09-27 (×5): 1 via ORAL
  Filled 2019-09-24 (×5): qty 1

## 2019-09-24 MED ORDER — ACETAMINOPHEN 10 MG/ML IV SOLN: 1000.0000 mg | Freq: Once | INTRAVENOUS | Status: DC | PRN

## 2019-09-24 MED ORDER — ACETAMINOPHEN 325 MG PO TABS: 325.0000 mg | ORAL_TABLET | Freq: Four times a day (QID) | ORAL | Status: DC | PRN

## 2019-09-24 MED ORDER — ACETAMINOPHEN 500 MG PO TABS
500.0000 mg | ORAL_TABLET | Freq: Three times a day (TID) | ORAL | Status: DC
  Administered 2019-09-24 – 2019-09-27 (×9): 500 mg via ORAL
  Filled 2019-09-24 (×9): qty 1

## 2019-09-24 MED ORDER — ONDANSETRON HCL 4 MG/2ML IJ SOLN
4.0000 mg | Freq: Once | INTRAMUSCULAR | Status: AC | PRN
  Administered 2019-09-24: 4 mg via INTRAVENOUS

## 2019-09-24 MED ORDER — ONDANSETRON HCL 4 MG PO TABS: 4.0000 mg | ORAL_TABLET | Freq: Four times a day (QID) | ORAL | Status: DC | PRN

## 2019-09-24 MED ORDER — POLYETHYLENE GLYCOL 3350 17 G PO PACK: 17.0000 g | PACK | Freq: Every day | ORAL | Status: DC | PRN

## 2019-09-24 MED ORDER — SODIUM CHLORIDE 0.9 % IV SOLN
INTRAVENOUS | Status: DC | PRN
  Administered 2019-09-24: 50 ug/min via INTRAVENOUS

## 2019-09-24 MED ORDER — ENSURE PRE-SURGERY PO LIQD
296.0000 mL | Freq: Once | ORAL | Status: DC
  Filled 2019-09-24: qty 296

## 2019-09-24 MED ORDER — ONDANSETRON HCL 4 MG/2ML IJ SOLN
INTRAMUSCULAR | Status: AC
  Filled 2019-09-24: qty 2

## 2019-09-24 MED ORDER — PHENOL 1.4 % MT LIQD: 1.0000 | OROMUCOSAL | Status: DC | PRN

## 2019-09-24 MED ORDER — ONDANSETRON HCL 4 MG/2ML IJ SOLN: 4.0000 mg | Freq: Four times a day (QID) | INTRAMUSCULAR | Status: DC | PRN

## 2019-09-24 MED ORDER — PROPOFOL 10 MG/ML IV BOLUS
INTRAVENOUS | Status: AC
  Filled 2019-09-24: qty 20

## 2019-09-24 MED ORDER — PHENYLEPHRINE 40 MCG/ML (10ML) SYRINGE FOR IV PUSH (FOR BLOOD PRESSURE SUPPORT)
PREFILLED_SYRINGE | INTRAVENOUS | Status: DC | PRN
  Administered 2019-09-24 (×2): 80 ug via INTRAVENOUS

## 2019-09-24 MED ORDER — MIDAZOLAM HCL 2 MG/2ML IJ SOLN
INTRAMUSCULAR | Status: AC
  Filled 2019-09-24: qty 2

## 2019-09-24 MED ORDER — MENTHOL 3 MG MT LOZG: 1.0000 | LOZENGE | OROMUCOSAL | Status: DC | PRN

## 2019-09-24 MED ORDER — LIDOCAINE 2% (20 MG/ML) 5 ML SYRINGE
INTRAMUSCULAR | Status: DC | PRN
  Administered 2019-09-24: 40 mg via INTRAVENOUS

## 2019-09-24 MED ORDER — DOCUSATE SODIUM 100 MG PO CAPS
100.0000 mg | ORAL_CAPSULE | Freq: Two times a day (BID) | ORAL | Status: DC
  Administered 2019-09-24 – 2019-09-27 (×5): 100 mg via ORAL
  Filled 2019-09-24 (×6): qty 1

## 2019-09-24 MED ORDER — FENTANYL CITRATE (PF) 100 MCG/2ML IJ SOLN: 25.0000 ug | INTRAMUSCULAR | Status: DC | PRN

## 2019-09-24 MED ORDER — LIDOCAINE 2% (20 MG/ML) 5 ML SYRINGE
INTRAMUSCULAR | Status: AC
  Filled 2019-09-24: qty 5

## 2019-09-24 MED ORDER — METOCLOPRAMIDE HCL 5 MG/ML IJ SOLN: 5.0000 mg | Freq: Three times a day (TID) | INTRAMUSCULAR | Status: DC | PRN

## 2019-09-24 MED ORDER — CEFAZOLIN SODIUM-DEXTROSE 2-4 GM/100ML-% IV SOLN
2.0000 g | Freq: Four times a day (QID) | INTRAVENOUS | Status: AC
  Administered 2019-09-25 (×2): 2 g via INTRAVENOUS
  Filled 2019-09-24 (×2): qty 100

## 2019-09-24 MED ORDER — ROCURONIUM BROMIDE 10 MG/ML (PF) SYRINGE
PREFILLED_SYRINGE | INTRAVENOUS | Status: AC
  Filled 2019-09-24: qty 10

## 2019-09-24 MED ORDER — MORPHINE SULFATE (PF) 2 MG/ML IV SOLN: 0.5000 mg | INTRAVENOUS | Status: DC | PRN

## 2019-09-24 MED ORDER — METOCLOPRAMIDE HCL 5 MG PO TABS: 5.0000 mg | ORAL_TABLET | Freq: Three times a day (TID) | ORAL | Status: DC | PRN

## 2019-09-24 MED ORDER — ROCURONIUM BROMIDE 10 MG/ML (PF) SYRINGE
PREFILLED_SYRINGE | INTRAVENOUS | Status: DC | PRN
  Administered 2019-09-24: 50 mg via INTRAVENOUS

## 2019-09-24 MED ORDER — CEFAZOLIN SODIUM-DEXTROSE 2-4 GM/100ML-% IV SOLN: 2.0000 g | INTRAVENOUS | Status: DC

## 2019-09-24 MED ORDER — SODIUM CHLORIDE 0.9 % IV SOLN
INTRAVENOUS | Status: AC
  Administered 2019-09-24: 15:00:00 via INTRAVENOUS

## 2019-09-24 MED ORDER — CEFAZOLIN SODIUM-DEXTROSE 2-4 GM/100ML-% IV SOLN
2.0000 g | Freq: Once | INTRAVENOUS | Status: AC
  Administered 2019-09-24: 2 g via INTRAVENOUS
  Filled 2019-09-24: qty 100

## 2019-09-24 MED ORDER — MORPHINE SULFATE (PF) 2 MG/ML IV SOLN
2.0000 mg | INTRAVENOUS | Status: DC | PRN
  Administered 2019-09-24 – 2019-09-25 (×4): 2 mg via INTRAVENOUS
  Filled 2019-09-24 (×4): qty 1

## 2019-09-24 MED ORDER — MIDAZOLAM HCL 5 MG/5ML IJ SOLN
INTRAMUSCULAR | Status: DC | PRN
  Administered 2019-09-24: 1 mg via INTRAVENOUS

## 2019-09-24 MED ORDER — POVIDONE-IODINE 10 % EX SWAB: 2.0000 "application " | Freq: Once | CUTANEOUS | Status: DC

## 2019-09-24 SURGICAL SUPPLY — 54 items
BIT DRILL 4.3MMS DISTAL GRDTED (BIT) IMPLANT
BNDG COHESIVE 6X5 TAN STRL LF (GAUZE/BANDAGES/DRESSINGS) IMPLANT
BRUSH SCRUB EZ PLAIN DRY (MISCELLANEOUS) ×4 IMPLANT
CANISTER SUCTION WELLS/JOHNSON (MISCELLANEOUS) ×1 IMPLANT
COVER PERINEAL POST (MISCELLANEOUS) ×2 IMPLANT
COVER SURGICAL LIGHT HANDLE (MISCELLANEOUS) ×3 IMPLANT
COVER WAND RF STERILE (DRAPES) ×2 IMPLANT
DRAPE C-ARM 42X72 X-RAY (DRAPES) ×1 IMPLANT
DRAPE HALF SHEET 40X57 (DRAPES) ×1 IMPLANT
DRAPE ORTHO SPLIT 77X108 STRL (DRAPES) ×4
DRAPE SURG ORHT 6 SPLT 77X108 (DRAPES) ×2 IMPLANT
DRAPE U-SHAPE 47X51 STRL (DRAPES) IMPLANT
DRILL 4.3MMS DISTAL GRADUATED (BIT) ×2
DRSG MEPILEX BORDER 4X4 (GAUZE/BANDAGES/DRESSINGS) ×3 IMPLANT
DRSG MEPILEX BORDER 4X8 (GAUZE/BANDAGES/DRESSINGS) ×2 IMPLANT
ELECT REM PT RETURN 9FT ADLT (ELECTROSURGICAL) ×2
ELECTRODE REM PT RTRN 9FT ADLT (ELECTROSURGICAL) ×1 IMPLANT
GAUZE SPONGE 4X4 12PLY STRL (GAUZE/BANDAGES/DRESSINGS) ×1 IMPLANT
GLOVE BIO SURGEON STRL SZ7.5 (GLOVE) ×2 IMPLANT
GLOVE BIO SURGEON STRL SZ8 (GLOVE) ×2 IMPLANT
GLOVE BIOGEL PI IND STRL 7.5 (GLOVE) ×1 IMPLANT
GLOVE BIOGEL PI IND STRL 8 (GLOVE) ×1 IMPLANT
GLOVE BIOGEL PI INDICATOR 7.5 (GLOVE) ×1
GLOVE BIOGEL PI INDICATOR 8 (GLOVE) ×1
GOWN STRL REUS W/ TWL LRG LVL3 (GOWN DISPOSABLE) ×2 IMPLANT
GOWN STRL REUS W/ TWL XL LVL3 (GOWN DISPOSABLE) ×1 IMPLANT
GOWN STRL REUS W/TWL LRG LVL3 (GOWN DISPOSABLE) ×4
GOWN STRL REUS W/TWL XL LVL3 (GOWN DISPOSABLE) ×2
GUIDEPIN 3.2X17.5 THRD DISP (PIN) ×2 IMPLANT
GUIDEWIRE BALL NOSE 100CM (WIRE) ×1 IMPLANT
HFN RH 130 DEG 9MM X 380MM (Nail) ×1 IMPLANT
HIP FRA NAIL LAG SCREW 10.5X90 (Orthopedic Implant) ×2 IMPLANT
KIT BASIN OR (CUSTOM PROCEDURE TRAY) ×2 IMPLANT
KIT TURNOVER KIT B (KITS) ×2 IMPLANT
NS IRRIG 1000ML POUR BTL (IV SOLUTION) ×2 IMPLANT
PACK GENERAL/GYN (CUSTOM PROCEDURE TRAY) ×2 IMPLANT
PACK UNIVERSAL I (CUSTOM PROCEDURE TRAY) ×1 IMPLANT
PAD ARMBOARD 7.5X6 YLW CONV (MISCELLANEOUS) ×4 IMPLANT
PENCIL BUTTON HOLSTER BLD 10FT (ELECTRODE) ×1 IMPLANT
SCREW BONE CORTICAL 5.0X40 (Screw) ×2 IMPLANT
SCREW BONE CORTICAL 5.0X44 (Screw) ×1 IMPLANT
SCREW LAG HIP FRA NAIL 10.5X90 (Orthopedic Implant) IMPLANT
STAPLER VISISTAT 35W (STAPLE) ×2 IMPLANT
STOCKINETTE IMPERVIOUS LG (DRAPES) IMPLANT
SUT ETHILON 2 0 PSLX (SUTURE) ×1 IMPLANT
SUT ETHILON 3 0 PS 1 (SUTURE) ×1 IMPLANT
SUT VIC AB 0 CT1 27 (SUTURE)
SUT VIC AB 0 CT1 27XBRD ANBCTR (SUTURE) IMPLANT
SUT VIC AB 1 CT1 27 (SUTURE)
SUT VIC AB 1 CT1 27XBRD ANBCTR (SUTURE) ×1 IMPLANT
SUT VIC AB 2-0 CT1 27 (SUTURE)
SUT VIC AB 2-0 CT1 TAPERPNT 27 (SUTURE) ×1 IMPLANT
TOWEL GREEN STERILE (TOWEL DISPOSABLE) ×4 IMPLANT
TOWEL GREEN STERILE FF (TOWEL DISPOSABLE) ×2 IMPLANT

## 2019-09-24 NOTE — Progress Notes (Signed)
  Echocardiogram 2D Echocardiogram has been performed.  Christina Morgan 09/24/2019, 9:47 AM

## 2019-09-24 NOTE — Progress Notes (Addendum)
PROGRESS NOTE    Christina Morgan  ZOX:[id number] DOB: Apr 03, 1933 DOA: 09/23/2019 PCP: Seward Carol, MD  Brief Narrative: 85 y.o. female with medical history significant of chronic congestive heart failure, CKD stage III, lymphedema, pretension, hyperlipidemia and Paroxysmal atrial fibrillation later status post fall.  Patient lives alone at home.  According to the daughter she was in her usual state of health until 930 this morning when she spoke to her daughter on the phone and she thinks patient fell somewhere between 1030 and 86.  Patient does not know how she fell but she denies loss of consciousness chest pain shortness of breath nausea vomiting abdominal pain diarrhea urinary complaints headache changes in vision.  Patient has been gaining weight progressively she gained weight from 1 76-1 81 gradually.  ED Course: Labs in the ED sodium 141 potassium 3.8 BUN 21 creatinine 1.49 white count 8.9 hemoglobin 11.4 platelet count 226.  Covid is pending.  Echocardiogram February 2020 with normal ejection fraction.  X-ray thoracic spine status post T11 and T12 kyphoplasty new compression deformities noted T4-T5 and T7 concerning for fractures of indeterminate age, L1 compression fracture   Assessment & Plan:   Active Problems:   Fall   Closed displaced intertrochanteric fracture of right femur ([place])   Compression of lumbar vertebra ([place])  #1 right hip fracture patient is not sure how she fell.    Seen by Ortho patient to go to OR today.  #2 history of chronic atrial fibrillation patient is on Eliquis, metoprolol, flecainide.  Hold Eliquis for surgery.  Continue rate control.  Continue hydralazine  #3 history of diastolic heart failure with normal ejection fraction she is on Demadex at home which is on hold.she appears more on the dry side with clear chest x-ray I will hold her Demadex as he is n.p.o.  Echo 09/24/2019-. Left ventricular ejection fraction, by visual estimation, is 65 to  70%. The left ventricle has hyperdynamic function. Normal left ventricular size. There is mildly increased left ventricular hypertrophy.  Elevated mean left atrial pressure.  Left ventricular diastolic Doppler parameters are consistent with pseudonormalization pattern of LV diastolic filling.   Global right ventricle has normal systolic function.The right ventricular size is normal. No increase in right ventricular wall thickness.  Left atrial size was mildly dilated.   Right atrial size was normal. . The mitral valve is normal in structure. No evidence of mitral valve regurgitation. No evidence of mitral stenosis. . The tricuspid valve is normal in structure. Tricuspid valve regurgitation is trivial.   The aortic valve is tricuspid Aortic valve regurgitation is mild by color flow Doppler. Mild aortic valve sclerosis without stenosis. The pulmonic valve was normal in structure. Pulmonic valve regurgitation is mild by color flow Doppler.  Moderately elevated pulmonary artery systolic pressure.  The inferior vena cava is normal in size with greater than 50% respiratory variability, suggesting right atrial pressure of 3 mmHg.   #4 hypertension  bp 131/54 continue hydralazine atenolol  #5 elevated creatinine-in spite of holding the Demadex.  I will give her slow IV fluids cautiously.  Estimated body mass index is 28.79 kg/m as calculated from the following:   Height as of this encounter: 5\' 6"  (1.676 m).   Weight as of this encounter: 80.9 kg.  DVT prophylaxis: SCD she is going for surgery tomorrow  code Status: Full code Family Communication: Discussed with daughter Yaslyn disposition Plan: Pending surgery  consults called:cardiology    Subjective: Sitting in bed no new complaints other  than pain denies chest pain shortness of breath feels mouth is dry  Objective: Vitals:   09/23/19 2243 09/24/19 0037 09/24/19 0110 09/24/19 0135  BP: 130/62 (!) 112/47 (!) 131/54   Pulse: 62 61  66   Resp: 16     Temp:   98.4 F (36.9 C)   TempSrc:   Oral   SpO2: 97%  96%   Weight:    80.9 kg  Height:    5\' 6"  (1.676 m)    Intake/Output Summary (Last 24 hours) at 09/24/2019 1440 Last data filed at 09/24/2019 1305 Gross per 24 hour  Intake 0 ml  Output --  Net 0 ml   Filed Weights   09/24/19 0135  Weight: 80.9 kg    Examination:  General exam: Appears calm and comfortable  Respiratory system: Clear to auscultation. Respiratory effort normal. Cardiovascular system: S1 & S2 heard, RRR. No JVD, murmurs, rubs, gallops or clicks. No pedal edema. Gastrointestinal system: Abdomen is nondistended, soft and nontender. No organomegaly or masses felt. Normal bowel sounds heard. Central nervous system: Alert and oriented. No focal neurological deficits. Extremities: 1+ edema in both lower extremities right hip tender  skin: No rashes, lesions or ulcers Psychiatry: Judgement and insight appear normal. Mood & affect appropriate.     Data Reviewed: I have personally reviewed following labs and imaging studies  CBC: Recent Labs  Lab 09/23/19 1307 09/24/19 0254  WBC 8.9 7.6  NEUTROABS 7.1 6.1  HGB 11.4* 10.5*  HCT 36.3 31.8*  MCV 100.0 98.8  PLT 226 481   Basic Metabolic Panel: Recent Labs  Lab 09/23/19 1307 09/24/19 0254  NA 141 142  K 3.8 4.6  CL 104 105  CO2 28 26  GLUCOSE 118* 118*  BUN 21 23  CREATININE 1.49* 1.60*  CALCIUM 8.6* 8.5*   GFR: Estimated Creatinine Clearance: 28.1 mL/min (A) (by C-G formula based on SCr of 1.6 mg/dL (H)). Liver Function Tests: Recent Labs  Lab 09/24/19 0254  AST 21  ALT 13  ALKPHOS 74  BILITOT 1.2  PROT 5.3*  ALBUMIN 2.8*   No results for input(s): LIPASE, AMYLASE in the last 168 hours. No results for input(s): AMMONIA in the last 168 hours. Coagulation Profile: No results for input(s): INR, PROTIME in the last 168 hours. Cardiac Enzymes: No results for input(s): CKTOTAL, CKMB, CKMBINDEX, TROPONINI in the last  168 hours. BNP (last 3 results) Recent Labs    01/04/19 1306  PROBNP 593   HbA1C: No results for input(s): HGBA1C in the last 72 hours. CBG: No results for input(s): GLUCAP in the last 168 hours. Lipid Profile: No results for input(s): CHOL, HDL, LDLCALC, TRIG, CHOLHDL, LDLDIRECT in the last 72 hours. Thyroid Function Tests: No results for input(s): TSH, T4TOTAL, FREET4, T3FREE, THYROIDAB in the last 72 hours. Anemia Panel: No results for input(s): VITAMINB12, FOLATE, FERRITIN, TIBC, IRON, RETICCTPCT in the last 72 hours. Sepsis Labs: No results for input(s): PROCALCITON, LATICACIDVEN in the last 168 hours.  Recent Results (from the past 240 hour(s))  SARS CORONAVIRUS 2 (TAT 6-24 HRS) Nasopharyngeal Nasopharyngeal Swab     Status: None   Collection Time: 09/23/19  4:58 PM   Specimen: Nasopharyngeal Swab  Result Value Ref Range Status   SARS Coronavirus 2 NEGATIVE NEGATIVE Final    Comment: (NOTE) SARS-CoV-2 target nucleic acids are NOT DETECTED. The SARS-CoV-2 RNA is generally detectable in upper and lower respiratory specimens during the acute phase of infection. Negative results do not preclude SARS-CoV-2 infection,  do not rule out co-infections with other pathogens, and should not be used as the sole basis for treatment or other patient management decisions. Negative results must be combined with clinical observations, patient history, and epidemiological information. The expected result is Negative. Fact Sheet for Patients: SugarRoll.be Fact Sheet for Healthcare Providers: https://www.[facility] [employer].com/ This test is not yet approved or cleared by the [place] FDA and  has been authorized for detection and/or diagnosis of SARS-CoV-2 by FDA under an Emergency Use Authorization (EUA). This EUA will remain  in effect (meaning this test can be used) for the duration of the COVID-19 declaration under Section 56 4(b)(1) of the  Act, 21 U.S.C. section 360bbb-3(b)(1), unless the authorization is terminated or revoked sooner. Performed at [facility], [place] [address], [place], [place] [address]   MRSA PCR Screening     Status: Abnormal   Collection Time: 09/24/19  2:16 AM   Specimen: Nasal Mucosa; Nasopharyngeal  Result Value Ref Range Status   MRSA by PCR POSITIVE (A) NEGATIVE Final    Comment:        The GeneXpert MRSA Assay (FDA approved for NASAL specimens only), is one component of a comprehensive MRSA colonization surveillance program. It is not intended to diagnose MRSA infection nor to guide or monitor treatment for MRSA infections. RESULT CALLED TO, READ BACK BY AND VERIFIED WITH: WORTH,S RN [phone] 09/24/2019 MITCHELL,L Performed at [facility], [place] [address] [place], [place] [address]          Radiology Studies: Dg Chest 1 View  Result Date: 09/23/2019 CLINICAL DATA:  Fall. EXAM: CHEST  1 VIEW COMPARISON:  March 30, 2018. FINDINGS: Mild cardiomegaly is noted. Atherosclerosis of thoracic aorta is noted. No pneumothorax is noted. Lungs are clear. Bony thorax is unremarkable. IMPRESSION: No acute cardiopulmonary abnormality seen. Aortic Atherosclerosis (ICD10-I70.0). Electronically Signed   By: Marijo Conception M.D.   On: 09/23/2019 15:58   Dg Thoracic Spine 2 View  Result Date: 09/23/2019 CLINICAL DATA:  Back pain after fall. EXAM: THORACIC SPINE 2 VIEWS COMPARISON:  Apr 24, 2019. FINDINGS: Status post T11 and T12 kyphoplasty. New compression deformities are seen involving the T7, T5 and T4 vertebral bodies concerning for fractures of indeterminate age. No spondylolisthesis is noted. Diffuse osteopenia is noted. IMPRESSION: Status post T11 and T12 kyphoplasty. New compression deformities are noted at T4, T5 and T7 concerning for fractures of indeterminate age. MRI may be performed for further evaluation. Electronically Signed   By: Marijo Conception M.D.   On: 09/23/2019 15:55   Dg  Lumbar Spine Complete  Result Date: 09/23/2019 CLINICAL DATA:  Low back pain after fall. EXAM: LUMBAR SPINE - COMPLETE 4+ VIEW COMPARISON:  December 18, 2017. FINDINGS: Mild levoscoliosis of lumbar spine is noted. Status post kyphoplasty of T11 and T12 vertebral bodies. Diffuse osteopenia is noted. Severe compression deformity of L1 vertebral body is noted concerning for fracture of indeterminate age. Mild degenerative disc disease is noted at L2-3, L3-4 and L4-5. IMPRESSION: Interval development of compression deformity of L1 vertebral body concerning for fracture of indeterminate age. MRI may be performed to evaluate for acute fracture. Multilevel degenerative disc disease is noted. Diffuse osteopenia. Electronically Signed   By: Marijo Conception M.D.   On: 09/23/2019 15:57   Ct Head Wo Contrast  Result Date: 09/23/2019 CLINICAL DATA:  Posttraumatic headache after fall. No loss of consciousness. EXAM: CT HEAD WITHOUT CONTRAST CT CERVICAL SPINE WITHOUT CONTRAST TECHNIQUE: Multidetector CT imaging  of the head and cervical spine was performed following the standard protocol without intravenous contrast. Multiplanar CT image reconstructions of the cervical spine were also generated. COMPARISON:  None. FINDINGS: CT HEAD FINDINGS Brain: Mild chronic ischemic white matter disease is noted. Large calcified left parietal meningioma is noted. No mass effect or midline shift is noted. No hemorrhage or acute infarction is noted. Ventricular size is within normal limits. Vascular: No hyperdense vessel or unexpected calcification. Skull: Normal. Negative for fracture or focal lesion. Sinuses/Orbits: No acute finding. Other: None. CT CERVICAL SPINE FINDINGS Alignment: Minimal grade 1 anterolisthesis of C5-6 is noted secondary to posterior facet joint hypertrophy. Skull base and vertebrae: No acute fracture. No primary bone lesion or focal pathologic process. Soft tissues and spinal canal: No prevertebral fluid or swelling.  No visible canal hematoma. Disc levels: Mild degenerative disc disease is noted at C5-6 and C6-7. Upper chest: Negative. Other: Degenerative changes are seen involving the right-sided posterior facet joints. IMPRESSION: Mild chronic ischemic white matter disease. Large calcified left parietal meningioma. No acute intracranial abnormality seen. Mild multilevel degenerative disc disease. No acute abnormality seen in the cervical spine. Electronically Signed   By: Marijo Conception M.D.   On: 09/23/2019 14:14   Ct Cervical Spine Wo Contrast  Result Date: 09/23/2019 CLINICAL DATA:  Posttraumatic headache after fall. No loss of consciousness. EXAM: CT HEAD WITHOUT CONTRAST CT CERVICAL SPINE WITHOUT CONTRAST TECHNIQUE: Multidetector CT imaging of the head and cervical spine was performed following the standard protocol without intravenous contrast. Multiplanar CT image reconstructions of the cervical spine were also generated. COMPARISON:  None. FINDINGS: CT HEAD FINDINGS Brain: Mild chronic ischemic white matter disease is noted. Large calcified left parietal meningioma is noted. No mass effect or midline shift is noted. No hemorrhage or acute infarction is noted. Ventricular size is within normal limits. Vascular: No hyperdense vessel or unexpected calcification. Skull: Normal. Negative for fracture or focal lesion. Sinuses/Orbits: No acute finding. Other: None. CT CERVICAL SPINE FINDINGS Alignment: Minimal grade 1 anterolisthesis of C5-6 is noted secondary to posterior facet joint hypertrophy. Skull base and vertebrae: No acute fracture. No primary bone lesion or focal pathologic process. Soft tissues and spinal canal: No prevertebral fluid or swelling. No visible canal hematoma. Disc levels: Mild degenerative disc disease is noted at C5-6 and C6-7. Upper chest: Negative. Other: Degenerative changes are seen involving the right-sided posterior facet joints. IMPRESSION: Mild chronic ischemic white matter disease.  Large calcified left parietal meningioma. No acute intracranial abnormality seen. Mild multilevel degenerative disc disease. No acute abnormality seen in the cervical spine. Electronically Signed   By: Marijo Conception M.D.   On: 09/23/2019 14:14   Dg Hip Unilat W Or Wo Pelvis 2-3 Views Right  Result Date: 09/23/2019 CLINICAL DATA:  Right hip pain after fall. EXAM: DG HIP (WITH OR WITHOUT PELVIS) 2-3V RIGHT COMPARISON:  None. FINDINGS: Severely displaced and comminuted fracture is seen involving the intertrochanteric region of the proximal right femur. Vascular calcifications are noted. Right femoral head remains within the acetabulum. IMPRESSION: Severely displaced and comminuted intertrochanteric fracture of proximal right femur. Electronically Signed   By: Marijo Conception M.D.   On: 09/23/2019 15:53        Scheduled Meds:  atenolol  50 mg Oral BID   atorvastatin  10 mg Oral QHS   flecainide  100 mg Oral BID   hydrALAZINE  10 mg Oral Q8H   [START ON 09/25/2019] influenza vaccine adjuvanted  0.5 mL Intramuscular  Tomorrow-1000   mupirocin ointment  1 application Nasal BID   Continuous Infusions:   LOS: 1 day      Georgette Shell, MD [facility]   If 7PM-7AM, please contact night-coverage [email or web address] Password [email or web address] 09/24/2019, 2:40 PM

## 2019-09-24 NOTE — Anesthesia Postprocedure Evaluation (Signed)
Anesthesia Post Note  Patient: Christina Morgan  Procedure(s) Performed: INTRAMEDULLARY (IM) NAIL FEMORAL (Right Hip)     Patient location during evaluation: PACU Anesthesia Type: General Level of consciousness: sedated Pain management: pain level controlled Vital Signs Assessment: post-procedure vital signs reviewed and stable Respiratory status: spontaneous breathing and respiratory function stable Cardiovascular status: stable Postop Assessment: no apparent nausea or vomiting Anesthetic complications: no    Last Vitals:  Vitals:   09/24/19 1958 09/24/19 2014  BP: (!) 166/60 (!) 164/61  Pulse: (!) 57 (!) 57  Resp: 16   Temp: (!) 36.1 C 36.5 C  SpO2: 91% 98%    Last Pain:  Vitals:   09/24/19 2014  TempSrc: Oral  PainSc:                  Khoi Hamberger DANIEL

## 2019-09-24 NOTE — Consult Note (Addendum)
Cardiology Consultation:   Patient ID: Christina Morgan MRN: [id number]; DOB: 1933/02/28  Admit date: 09/23/2019 Date of Consult: 09/24/2019  Primary Care Provider: Seward Carol, MD Primary Cardiologist: Fransico Him, MD  Primary Electrophysiologist:  None    Patient Profile:   Christina Morgan is a 85 y.o. female with a hx of paroxysmal atrial fibrillation on Eliquis and flecainide, hypertension, hyperlipidemia, CKD stage III, endometrial cancer diastolic CHF and chronic lower extremity edema who is being seen today for preoperative evaluation at the request of Rodena Piety.  History of Present Illness:   Christina Morgan has a past medical history of paroxysmal atrial fibrillation on Eliquis and flecainide, hypertension, hyperlipidemia, diastolic CHF and chronic lower extremity edema.    The patient had a normal stress Myoview in 12/2018. Echocardiogram 01/16/2019 showed EF 30-16% with diastolic dysfunction and global longitudinal strain of -22.6 (normal).  She had severely dilated left atrium and no significant valvular disease.  Outpatient heart monitor in 02/2019 showed normal sinus rhythm with occasional PACs and PVCs, no significant slow heart rates when awake.  The patient was last seen in the office in 06/2019 and was noted to have been having worsening lower extremity edema.  She was previously treated with antibiotics for possible cellulitis.  Her Lasix was also increased but lower extremity edema persisted.  The patient has been very sedentary and was felt to have a component of chronic venous insufficiency exacerbated by amlodipine and possibly Megace.  Her amlodipine was decreased then discontinued her Lasix was changed to Demadex.  Hydralazine was added for blood pressure control.  Her lower extremity edema was still present, but much improved.  The patient apparently lives alone and had a fall this morning.  The patient was unable to provide the circumstances of her fall but she  denied loss of consciousness, chest pain or shortness of breath.  She was noted to have a weight gain of 4 pounds over time.  Labs in the ED were relatively stable.  She was noted to have new compression deformities on x-ray of the spine.  X-rays showed severely displaced and commuted intertrochanteric fracture of the proximal right femur and she is planned for surgical repair of her hip.  Eliquis is currently on hold for surgery.  Christina Morgan is alert and oriented in the bed. Her pain is not too bad after taking morphine. She says that she does not know how she fell. She was not dizzy and does not recall tripping. She says that she just went down. Her daughter is present today and says that the patient has been very limited in activity due to weakness and shortness of breath for at least the last year. She uses a walker in the home and has to use a wheelchair whenever she goes out. She does live alone. The patient denies any chest pain, shortness of breath, palpitations or lightheadedness now or surrounding the fall. She did not lose consciousness however, her daughter says that she was mildly confused when EMS got there.   The patient has history of endometrial cancer in 2013 treated with hysterectomy. She had a recurrence of cancer in the vaginal cuff several years later treated with surgery and radiation. She has had another lesion in the vagina that is now being treated with megace and tamoxifen. About a month ago her oncologist became concerned about her leg lymphedema and she was referred to a lymphedema clinic in [facility]. She has had treatments with leg wraps and has been planned  for a home lymphedema pump to be delivered soon. She also has been unable to lay flat for a long time. She sleeps in a recliner. She is unable to elevate her legs above her heart as this makes it hard for her to breath.   Today her legs only have mild edema and her daughter is amazed that this is the smallest her legs  have been on many months. Her wt had gone up 176 lbs>181 lbs last week and she was to be seen at our office today to evaluate.   Heart Pathway Score:     Past Medical History:  Diagnosis Date   Cancer ([place])    endometrial ca   Chronic diastolic CHF (congestive heart failure) (HCC)    Chronic edema    CKD (chronic kidney disease), stage III    Dyslipidemia    Fracture of left humerus    History of cardiovascular stress test    Lexiscan Myoview (06/2014): No ischemia or scar, not gated, low risk   Hx of echocardiogram    Echo (05/02/14): EF 60% to 65%. No regional wall motion abnormalities. Mild AI, mildly dilated aortic root (37 mm), trivial MR, trivial TR   Hyperlipidemia    Hypertension    Lipoma of skin    multiple   Osteopenia    PAF (paroxysmal atrial fibrillation) ([place])    failed DCCV 05/2014 >> Flecainide started >> DCCV 7/15 sucessful;  f/u ETT neg for pro-arrhythmia >> recurrent AF/AFL >> Flecainide inc to 100 bid with repeat DCCV 9/15    Past Surgical History:  Procedure Laterality Date   CARDIOVERSION N/A 06/05/2014   Procedure: CARDIOVERSION;  Surgeon: Sueanne Margarita, MD;  Location: [place];  Service: Cardiovascular;  Laterality: N/A;   CARDIOVERSION N/A 07/11/2014   Procedure: CARDIOVERSION;  Surgeon: Sueanne Margarita, MD;  Location: [place];  Service: Cardiovascular;  Laterality: N/A;   CARDIOVERSION N/A 09/04/2014   Procedure: CARDIOVERSION;  Surgeon: Sueanne Margarita, MD;  Location: [facility] ENDOSCOPY;  Service: Cardiovascular;  Laterality: N/A;   COLONOSCOPY  2008   COLONOSCOPY N/A 05/20/2015   Procedure: COLONOSCOPY;  Surgeon: Ronald Lobo, MD;  Location: [facility] ENDOSCOPY;  Service: Endoscopy;  Laterality: N/A;   IR KYPHO EA ADDL LEVEL THORACIC OR LUMBAR  04/12/2019   IR KYPHO THORACIC WITH BONE BIOPSY  04/12/2019   ROBOTIC ASSISTED SUPRACERVICAL HYSTERECTOMY WITH BILATERAL SALPINGO OOPHERECTOMY  11/06/2012   and bilateral pelvic lymph node dissection      Home Medications:  Prior to Admission medications   Medication Sig Start Date End Date Taking? Authorizing Provider  apixaban (ELIQUIS) 5 MG TABS tablet Take 5 mg by mouth 2 (two) times daily.   Yes [provider]  atenolol (TENORMIN) 50 MG tablet TAKE 1 TABLET BY MOUTH TWICE DAILY Patient taking differently: Take 50 mg by mouth 2 (two) times daily.  05/02/19  Yes Turner, Eber Hong, MD  atorvastatin (LIPITOR) 10 MG tablet Take 10 mg by mouth at bedtime.  10/04/12  Yes [provider]  flecainide (TAMBOCOR) 100 MG tablet TAKE 1 TABLET BY MOUTH TWICE DAILY Patient taking differently: Take 100 mg by mouth 2 (two) times daily.  08/22/19  Yes Turner, Eber Hong, MD  hydrALAZINE (APRESOLINE) 10 MG tablet Take 1 tablet (10 mg total) by mouth 2 (two) times a day. 06/21/19 06/15/20 Yes Turner, Eber Hong, MD  megestrol (MEGACE) 40 MG tablet Take 80 mg by mouth 2 (two) times daily. Take for 3 weeks , then alternate  with tamoxifen 20 mg 1 daily for 3 weeks, etc.   Yes [provider]  potassium chloride SA (K-DUR) 20 MEQ tablet Take 1 tablet (20 mEq total) by mouth 2 (two) times daily. 08/08/19  Yes Turner, Eber Hong, MD  tamoxifen (NOLVADEX) 20 MG tablet Take 20 mg by mouth 2 (two) times daily. Alternate every 3 weeks with megestrol. 02/07/19  Yes [provider]  torsemide (DEMADEX) 20 MG tablet Take 2 tablets (40 mg total) by mouth 2 (two) times daily. 06/21/19 06/15/20 Yes Sueanne Margarita, MD    Inpatient Medications: Scheduled Meds:  atenolol  50 mg Oral BID   atorvastatin  10 mg Oral QHS   flecainide  100 mg Oral BID   hydrALAZINE  10 mg Oral Q8H   [START ON 09/25/2019] influenza vaccine adjuvanted  0.5 mL Intramuscular Tomorrow-1000   mupirocin ointment  1 application Nasal BID   Continuous Infusions:  PRN Meds: morphine injection  Allergies:    Allergies  Allergen Reactions   Penicillins Other (See Comments)    Dizziness Has patient had a PCN reaction  causing immediate rash, facial/tongue/throat swelling, SOB or lightheadedness with hypotension: No Has patient had a PCN reaction causing severe rash involving mucus membranes or skin necrosis: No Has patient had a PCN reaction that required hospitalization: No Has patient had a PCN reaction occurring within the last 10 years: No If all of the above answers are "NO", then may proceed with Cephalosporin use.    Social History:   Social History   Socioeconomic History   Marital status: Widowed    Spouse name: Not on file   Number of children: Not on file   Years of education: Not on file   Highest education level: Not on file  Occupational History   Not on file  Social Needs   Financial resource strain: Not on file   Food insecurity    Worry: Not on file    Inability: Not on file   Transportation needs    Medical: Not on file    Non-medical: Not on file  Tobacco Use   Smoking status: Never Smoker   Smokeless tobacco: Never Used  Substance and Sexual Activity   Alcohol use: No   Drug use: No   Sexual activity: Not on file  Lifestyle   Physical activity    Days per week: Not on file    Minutes per session: Not on file   Stress: Not on file  Relationships   Social connections    Talks on phone: Not on file    Gets together: Not on file    Attends religious service: Not on file    Active member of club or organization: Not on file    Attends meetings of clubs or organizations: Not on file    Relationship status: Not on file   Intimate partner violence    Fear of current or ex partner: Not on file    Emotionally abused: Not on file    Physically abused: Not on file    Forced sexual activity: Not on file  Other Topics Concern   Not on file  Social History Narrative   Not on file    Family History:    Family History  Problem Relation Age of Onset   Cancer Father 52       metastatic oropharyngeal ca   Heart attack Mother    Coronary artery  disease Mother      ROS:  Please see the history of present illness.   All other ROS reviewed and negative.     Physical Exam/Data:   Vitals:   09/23/19 2243 09/24/19 0037 09/24/19 0110 09/24/19 0135  BP: 130/62 (!) 112/47 (!) 131/54   Pulse: 62 61 66   Resp: 16     Temp:   98.4 F (36.9 C)   TempSrc:   Oral   SpO2: 97%  96%   Weight:    80.9 kg  Height:    5\' 6"  (1.676 m)    Intake/Output Summary (Last 24 hours) at 09/24/2019 1256 Last data filed at 09/24/2019 1100 Gross per 24 hour  Intake 0 ml  Output --  Net 0 ml   Last 3 Weights 09/24/2019 07/04/2019 06/21/2019  Weight (lbs) 178 lb 5.6 oz 176 lb 3.2 oz 175 lb  Weight (kg) 80.9 kg 79.924 kg 79.379 kg     Body mass index is 28.79 kg/m.  General:  Well nourished, well developed, in no acute distress HEENT: normal Lymph: no adenopathy Neck: no JVD Endocrine:  No thryomegaly Vascular: No carotid bruits; FA pulses 2+ bilaterally without bruits  Cardiac:  normal S1, S2; RRR; 1/6 systolic murmur at RUSB/LUSB Lungs:  clear to auscultation bilaterally, no wheezing, rhonchi or rales  Abd: soft, nontender, no hepatomegaly  Ext: Trace-1+ bilater lower leg edema Musculoskeletal:  Right hip fx Skin: warm and dry  Neuro:  CNs 2-12 intact, no focal abnormalities noted Psych:  Normal affect   EKG:  The EKG was personally reviewed and demonstrates:  Sinus rhythm, 68 bpm,  Short PR interval, Nonspecific intraventricular conduction delay, QTC was read out as 515 but on my measurement appears more to be like 440 Telemetry:  Telemetry was personally reviewed and demonstrates:  1st degree AVB at 60 bpm  Relevant CV Studies:  Echocardiogram 09/24/2019 IMPRESSIONS  1. Left ventricular ejection fraction, by visual estimation, is 65 to 70%. The left ventricle has hyperdynamic function. Normal left ventricular size. There is mildly increased left ventricular hypertrophy.  2. Elevated mean left atrial pressure.  3. Left ventricular  diastolic Doppler parameters are consistent with pseudonormalization pattern of LV diastolic filling.  4. Global right ventricle has normal systolic function.The right ventricular size is normal. No increase in right ventricular wall thickness.  5. Left atrial size was mildly dilated.  6. Right atrial size was normal.  7. The mitral valve is normal in structure. No evidence of mitral valve regurgitation. No evidence of mitral stenosis.  8. The tricuspid valve is normal in structure. Tricuspid valve regurgitation is trivial.  9. The aortic valve is tricuspid Aortic valve regurgitation is mild by color flow Doppler. Mild aortic valve sclerosis without stenosis. 10. The pulmonic valve was normal in structure. Pulmonic valve regurgitation is mild by color flow Doppler. 11. Moderately elevated pulmonary artery systolic pressure. 12. The inferior vena cava is normal in size with greater than 50% respiratory variability, suggesting right atrial pressure of 3 mmHg.   Stress Myoview 01/10/2019 Study Highlights   Nuclear stress EF: 62%.  The study is normal.  This is a low risk study.  There was no ST segment deviation noted during stress.  No T wave inversion was noted during stress.   Low risk stress nuclear study with normal perfusion and normal left ventricular regional and global systolic function.     Echocardiogram 01/16/2019 IMPRESSIONS  1. The left ventricle has normal systolic function of 06-23%. The cavity size is normal. There is borderline increase  in left ventricular wall thickness. Echo evidence of pseudonormalization in diastolic relaxation Indeterminent filling pressures.  2. Global longitudinal strain -22.6% (normal).  3. The right ventricle has normal systolic function. The cavity in normal in size. There is no increase in right ventricular wall thickness.  4. Severely dilated left atrial size.  5. The mitral valve is normal in structure.  6. The aortic valve is  tricuspid. There is mild thickening and mild calcification of the aortic valve.  7. The pulmonic valve is normal in structure. Pulmonic valve regurgitation is mild by color flow Doppler.  8. There is mild dilatation of the ascending aorta.  Laboratory Data:  High Sensitivity Troponin:   Recent Labs  Lab 09/23/19 1937 09/24/19 0254  TROPONINIHS 8 9     Chemistry Recent Labs  Lab 09/23/19 1307 09/24/19 0254  NA 141 142  K 3.8 4.6  CL 104 105  CO2 28 26  GLUCOSE 118* 118*  BUN 21 23  CREATININE 1.49* 1.60*  CALCIUM 8.6* 8.5*  GFRNONAA 32* 29*  GFRAA 37* 34*  ANIONGAP 9 11    Recent Labs  Lab 09/24/19 0254  PROT 5.3*  ALBUMIN 2.8*  AST 21  ALT 13  ALKPHOS 74  BILITOT 1.2   Hematology Recent Labs  Lab 09/23/19 1307 09/24/19 0254  WBC 8.9 7.6  RBC 3.63* 3.22*  HGB 11.4* 10.5*  HCT 36.3 31.8*  MCV 100.0 98.8  MCH 31.4 32.6  MCHC 31.4 33.0  RDW 13.6 13.8  PLT 226 197   BNPNo results for input(s): BNP, PROBNP in the last 168 hours.  DDimer No results for input(s): DDIMER in the last 168 hours.   Radiology/Studies:  Dg Chest 1 View  Result Date: 09/23/2019 CLINICAL DATA:  Fall. EXAM: CHEST  1 VIEW COMPARISON:  March 30, 2018. FINDINGS: Mild cardiomegaly is noted. Atherosclerosis of thoracic aorta is noted. No pneumothorax is noted. Lungs are clear. Bony thorax is unremarkable. IMPRESSION: No acute cardiopulmonary abnormality seen. Aortic Atherosclerosis (ICD10-I70.0). Electronically Signed   By: Marijo Conception M.D.   On: 09/23/2019 15:58   Dg Thoracic Spine 2 View  Result Date: 09/23/2019 CLINICAL DATA:  Back pain after fall. EXAM: THORACIC SPINE 2 VIEWS COMPARISON:  Apr 24, 2019. FINDINGS: Status post T11 and T12 kyphoplasty. New compression deformities are seen involving the T7, T5 and T4 vertebral bodies concerning for fractures of indeterminate age. No spondylolisthesis is noted. Diffuse osteopenia is noted. IMPRESSION: Status post T11 and T12  kyphoplasty. New compression deformities are noted at T4, T5 and T7 concerning for fractures of indeterminate age. MRI may be performed for further evaluation. Electronically Signed   By: Marijo Conception M.D.   On: 09/23/2019 15:55   Dg Lumbar Spine Complete  Result Date: 09/23/2019 CLINICAL DATA:  Low back pain after fall. EXAM: LUMBAR SPINE - COMPLETE 4+ VIEW COMPARISON:  December 18, 2017. FINDINGS: Mild levoscoliosis of lumbar spine is noted. Status post kyphoplasty of T11 and T12 vertebral bodies. Diffuse osteopenia is noted. Severe compression deformity of L1 vertebral body is noted concerning for fracture of indeterminate age. Mild degenerative disc disease is noted at L2-3, L3-4 and L4-5. IMPRESSION: Interval development of compression deformity of L1 vertebral body concerning for fracture of indeterminate age. MRI may be performed to evaluate for acute fracture. Multilevel degenerative disc disease is noted. Diffuse osteopenia. Electronically Signed   By: Marijo Conception M.D.   On: 09/23/2019 15:57   Ct Head Wo Contrast  Result Date:  09/23/2019 CLINICAL DATA:  Posttraumatic headache after fall. No loss of consciousness. EXAM: CT HEAD WITHOUT CONTRAST CT CERVICAL SPINE WITHOUT CONTRAST TECHNIQUE: Multidetector CT imaging of the head and cervical spine was performed following the standard protocol without intravenous contrast. Multiplanar CT image reconstructions of the cervical spine were also generated. COMPARISON:  None. FINDINGS: CT HEAD FINDINGS Brain: Mild chronic ischemic white matter disease is noted. Large calcified left parietal meningioma is noted. No mass effect or midline shift is noted. No hemorrhage or acute infarction is noted. Ventricular size is within normal limits. Vascular: No hyperdense vessel or unexpected calcification. Skull: Normal. Negative for fracture or focal lesion. Sinuses/Orbits: No acute finding. Other: None. CT CERVICAL SPINE FINDINGS Alignment: Minimal grade 1  anterolisthesis of C5-6 is noted secondary to posterior facet joint hypertrophy. Skull base and vertebrae: No acute fracture. No primary bone lesion or focal pathologic process. Soft tissues and spinal canal: No prevertebral fluid or swelling. No visible canal hematoma. Disc levels: Mild degenerative disc disease is noted at C5-6 and C6-7. Upper chest: Negative. Other: Degenerative changes are seen involving the right-sided posterior facet joints. IMPRESSION: Mild chronic ischemic white matter disease. Large calcified left parietal meningioma. No acute intracranial abnormality seen. Mild multilevel degenerative disc disease. No acute abnormality seen in the cervical spine. Electronically Signed   By: Marijo Conception M.D.   On: 09/23/2019 14:14   Ct Cervical Spine Wo Contrast  Result Date: 09/23/2019 CLINICAL DATA:  Posttraumatic headache after fall. No loss of consciousness. EXAM: CT HEAD WITHOUT CONTRAST CT CERVICAL SPINE WITHOUT CONTRAST TECHNIQUE: Multidetector CT imaging of the head and cervical spine was performed following the standard protocol without intravenous contrast. Multiplanar CT image reconstructions of the cervical spine were also generated. COMPARISON:  None. FINDINGS: CT HEAD FINDINGS Brain: Mild chronic ischemic white matter disease is noted. Large calcified left parietal meningioma is noted. No mass effect or midline shift is noted. No hemorrhage or acute infarction is noted. Ventricular size is within normal limits. Vascular: No hyperdense vessel or unexpected calcification. Skull: Normal. Negative for fracture or focal lesion. Sinuses/Orbits: No acute finding. Other: None. CT CERVICAL SPINE FINDINGS Alignment: Minimal grade 1 anterolisthesis of C5-6 is noted secondary to posterior facet joint hypertrophy. Skull base and vertebrae: No acute fracture. No primary bone lesion or focal pathologic process. Soft tissues and spinal canal: No prevertebral fluid or swelling. No visible canal  hematoma. Disc levels: Mild degenerative disc disease is noted at C5-6 and C6-7. Upper chest: Negative. Other: Degenerative changes are seen involving the right-sided posterior facet joints. IMPRESSION: Mild chronic ischemic white matter disease. Large calcified left parietal meningioma. No acute intracranial abnormality seen. Mild multilevel degenerative disc disease. No acute abnormality seen in the cervical spine. Electronically Signed   By: Marijo Conception M.D.   On: 09/23/2019 14:14   Dg Hip Unilat W Or Wo Pelvis 2-3 Views Right  Result Date: 09/23/2019 CLINICAL DATA:  Right hip pain after fall. EXAM: DG HIP (WITH OR WITHOUT PELVIS) 2-3V RIGHT COMPARISON:  None. FINDINGS: Severely displaced and comminuted fracture is seen involving the intertrochanteric region of the proximal right femur. Vascular calcifications are noted. Right femoral head remains within the acetabulum. IMPRESSION: Severely displaced and comminuted intertrochanteric fracture of proximal right femur. Electronically Signed   By: Marijo Conception M.D.   On: 09/23/2019 15:53    Assessment and Plan:   Preoperative evaluation -Planned right hip repair for fracture sustained with a fall. -Patient with normal myocardial perfusion study  in 12/2018.  Patient has HFpEF with chronic lower extremity edema that is felt to be multifactorial. Echo today without high risk features. -Troponins were negative. No anginal symptoms.  -According to the [facility] calculator this patient is a class II risk with 0.9% risk of major cardiac event perioperatively.  Her poor functional status does increase her risk for complications. -This patient can proceed with surgery with no further cardiac testing.   Chronic diastolic heart failure -Patient has chronic lower extremity edema felt to be multifactorial nature. -Echo today shows normal LV systolic function with diastolic dysfunction, mild aortic regurgitation -Patient has stable exertional dyspnea, related  to her inactivity. Activity is limited by weakness and shortness of breath. -Lungs clear on chest x-ray. -Torsemide currently on hold with plans for surgery.  Watch volume status perioperatively and provide diuretic as needed.  Lower extremity edema/lymphedema -Felt to be multifactorial from chronic venous insufficiency, sedentary state with her legs being dependent most of the time and dietary indiscretion with sodium along with possible effect of amlodipine and Megace. Question if her pelvic cancer/treatment with radiation could also be contributing.  Amlodipine has been discontinued with hydralazine use for blood pressure.  She continues on beta-blocker.  Furosemide was switched to torsemide with improvement. -Pt referred for lymphedema therapy by oncologist as an outpatient.  -Torsemide is currently on hold with plans for surgery. -Leg edema is currently  Much better than it has been in a long time, per her daughter, despite no diuretic since yesterday morning. Likely because legs have not been dependent since being in the hospital.   Paroxysmal atrial fibrillation -On flecainide and atenolol, maintaining sinus rhythm. -Pt may develop afib related to the surgery and increase adrenergic activity. We can adjust beta blocker as needed and as tolerated.  -On Eliquis for stroke risk reduction at home.  Eliquis is currently on hold for planned hip surgery.  Resume Eliquis postop when okay with surgeon.  Hypertension -On atenolol 50 mg twice daily, hydralazine 10 mg twice daily and Demadex 40 mg twice daily. -Blood pressure was initially elevated, now improved to 131/54.  CKD stage III -Serum creatinine baseline ~1.2-1.4.  Serum creatinine 1.49 on presentation, 1.60 today. -Monitor renal function perioperatively.  Hyperlipidemia -On atorvastatin 10 mg daily.  Continue statin perioperatively to reduce her risk of cardiac complications.      For questions or updates, please contact [place] Please consult [email or web address] for contact info under   Signed, Daune Perch, NP  09/24/2019 12:56 PM    Patient seen and examined. Agree with assessment and plan.  Christina Morgan is an 85 year old female who has a history of paroxysmal atrial fibrillation for which she has been on flecainide and Eliquis.  She also has a history of hypertension, hyperlipidemia, diastolic heart failure, and [place]k lower extremity edema with lymphedema.  She has documented normal systolic function with diastolic dysfunction.  She denies any known history of underlying CAD or anginal symptomatology.  He has a history of endometrial cancer status post hysterectomy, and has been having issues with lower extremity lymphedema is now undergoing treatment in [place].  Unfortunately, yesterday she fell and sustained a fracture to her right hip and is in need for surgery.  Her last dose of Eliquis was yesterday morning.  An echocardiographic study obtained today continues to show hyperdynamic LV function with mild LVH, grade 2 diastolic dysfunction as manifested by a pseudonormalization pattern of LV diastolic filling.  There is evidence for mild aortic sclerosis which is  contributory to her 1/6 to 2/6 systolic murmur in the aortic region.  There is mild aortic insufficiency as well as mild pulmonic insufficiency.  Her ECG reveals normal sinus rhythm at 68 bpm with mild nonspecific interventricular conduction delay.  Her significant previous edema has improved being in bed for the last several days.  Chest x-ray not reveal any acute cardiopulmonary abnormality although mild atherosclerosis of the thoracic aorta was noted.  She appears euvolemic on exam.  There is mild renal insufficiency with a creatinine increased to 1.6.  There is mild anemia with a hemoglobin of 10.5 and hematocrit 31.8. based on the above data, she will be given clearance to undergo her hip surgery following her recent fall.  Recommend a  postop ECG and postop telemetry.   Troy Sine, MD, [facility] 09/24/2019 3:22 PM

## 2019-09-24 NOTE — Progress Notes (Signed)
Report called to short stay. Patient off floor to OR.

## 2019-09-24 NOTE — Op Note (Signed)
@  DATE@ 7:28 PM  PATIENT:  Christina Morgan 85 y.o.  PRE-OPERATIVE DIAGNOSIS:  RIGHT SUBTROCHANTERIC HIP FRACTURE  POST-OPERATIVE DIAGNOSIS:  RIGHT SUBTROCHANTERIC HIP FRACTURE  PROCEDURE:  Procedure(s): INTRAMEDULLARY (IM) NAILING OF RIGHT HIP WITH OPEN REDUCTION AND 9 X 380 MM STATICALLY LOCKED BIOMET AFFIXUS NAIL  SURGEON:  Surgeon(s) and Role:    Altamese [place], MD - Primary  PHYSICIAN ASSISTANT: Ainsley Spinner, PA-C  ANESTHESIA:   general  I/O:  Total I/O In: 0  Out: 100 [Blood:100]  SPECIMEN:  No Specimen  TOURNIQUET:  * No tourniquets in log *  DICTATION: Note written in EPIC  BRIEF SUMMARY OF INDICATIONS FOR PROCEDURE:  Christina Morgan is a 85 y.o. who sustained a ground-level fall resulting in a severely displaced right subtrochanteric hip fracture.  I discussed with her the risks and benefits of surgical fixation including potential for blood loss requiring transfusion, DVT, PE, malunion, nonunion, malalignment, particularly given the location as well as need for further surgery, as well as heart attack and stroke given her multiple comorbidities and past medical history.  After acknowledgement of these risks, consent was provided to proceed.  BRIEF SUMMARY OF PROCEDURE:  The patient was given preoperative antibiotics and taken to the operating room where general anesthesia was induced. The patient was positioned on the radiolucent flat table with all prominences padded appropriately. Chlorhexadine wash was performed and standard Betadine scrub and paint.  Standard prep and drape was performed subsequently.  C-arm was brought in to identify the correct starting trajectory.  The skin incision was made proximal to greater trochanter.  Curved cannulated awl advanced to the correct starting position on AP and lateral views.  The threaded guidewire was advanced. A reduction maneuver was performed of the right subtrochanteric fracture to restore rotation and  alignment, and fortunately we were able to use a clamp through a small incision to achieve and maintain reduction. We reamed up to 11 mm, placed a 9 x 380 mm rod to the appropriate depth.  I placed a lag screw in the center-center position of the femur on 2 views, leaving the proximal shoulder of the lag screw out of the cortex. Because the it was a subtroch the antirotation screw was tightened fully to create a fixed angle device. Then 2 lag screws were placed using perfect circle technique distally.  All screws and hardware position were checked on AP and lateral images for appropriate position.  Ainsley Spinner, PA-C, assisted me throughout and assistant was absolutely necessary to produce reduction to allow for reaming.  Also assisted with placement of definitive fixation and combined closure to expedite our time in the OR in hopes of reducing complications. Standard layered closure was performed, 0 Vicryl for the vastus, #1 Vicryl using figure-of-eight for the tensor or IT band and then 0 Vicryl, 2-0 Vicryl, and 2-0 nylon.  Sterile gently compressive dressing was applied.  The patient was awakened from anesthesia and transported to PACU in stable condition.  PROGNOSIS:  Christina Morgan is at increased risk for multiple complications given her comorbidities.  That being said, patient will be allowed to weightbear as tolerated and immediately mobilize. Formal DVT prophylaxis will resume with Eliquis. We will continue to follow with the medical service and plan to see in the office for suture removal at 10 to 14 days.     Astrid Divine. Marcelino Scot, M.D.

## 2019-09-24 NOTE — Transfer of Care (Signed)
Immediate Anesthesia Transfer of Care Note  Patient: Christina Morgan  Procedure(s) Performed: INTRAMEDULLARY (IM) NAIL FEMORAL (Right Hip)  Patient Location: PACU  Anesthesia Type:General  Level of Consciousness: drowsy, patient cooperative and responds to stimulation  Airway & Oxygen Therapy: Patient Spontanous Breathing and Patient connected to nasal cannula oxygen  Post-op Assessment: Report given to RN and Post -op Vital signs reviewed and stable  Post vital signs: Reviewed and stable  Last Vitals:  Vitals Value Taken Time  BP 159/39 09/24/19 1943  Temp 36 C 09/24/19 1943  Pulse 58 09/24/19 1946  Resp 15 09/24/19 1946  SpO2 94 % 09/24/19 1946  Vitals shown include unvalidated device data.  Last Pain:  Vitals:   09/24/19 1943  TempSrc:   PainSc: (P) Asleep         Complications: No apparent anesthesia complications

## 2019-09-24 NOTE — Progress Notes (Addendum)
Ainsley Spinner, PA notified that patient had Eliquis yesterday. Spoke to Dr. Valma Cava he advised we need cardiac clearance prior to surgery. Dr. Marcelino Scot notified. Will delay surgery until cardiac clearance is obtained. Spoke to cardiology they do not have an estimate when they can see patient but are aware that surgery is being held until we received their input and that an Echo has been ordered that has not been completed. Family and patient notified of plan. Report given to Sam, RN on 5N. Left message for Echo asking if Echo could be done early if possible.

## 2019-09-24 NOTE — Anesthesia Procedure Notes (Signed)
Procedure Name: Intubation Date/Time: 09/24/2019 5:32 PM Performed by: Moshe Salisbury, CRNA Pre-anesthesia Checklist: Patient identified, Emergency Drugs available, Suction available and Patient being monitored Patient Re-evaluated:Patient Re-evaluated prior to induction Oxygen Delivery Method: Circle System Utilized Preoxygenation: Pre-oxygenation with 100% oxygen Induction Type: IV induction Ventilation: Mask ventilation without difficulty Laryngoscope Size: Mac and 3 Grade View: Grade I Tube type: Oral Tube size: 7.5 mm Number of attempts: 1 Airway Equipment and Method: Stylet Placement Confirmation: ETT inserted through vocal cords under direct vision,  positive ETCO2 and breath sounds checked- equal and bilateral Secured at: 20 cm Tube secured with: Tape Dental Injury: Teeth and Oropharynx as per pre-operative assessment

## 2019-09-25 ENCOUNTER — Encounter (HOSPITAL_COMMUNITY): Payer: Self-pay | Admitting: Orthopedic Surgery

## 2019-09-25 DIAGNOSIS — E559 Vitamin D deficiency, unspecified: Secondary | ICD-10-CM

## 2019-09-25 HISTORY — DX: Vitamin D deficiency, unspecified: E55.9

## 2019-09-25 LAB — CBC WITH DIFFERENTIAL/PLATELET
Abs Immature Granulocytes: 0.03 10*3/uL (ref 0.00–0.07)
Basophils Absolute: 0 10*3/uL (ref 0.0–0.1)
Basophils Relative: 0 %
Eosinophils Absolute: 0 10*3/uL (ref 0.0–0.5)
Eosinophils Relative: 0 %
HCT: 28.1 % — ABNORMAL LOW (ref 36.0–46.0)
Hemoglobin: 9.2 g/dL — ABNORMAL LOW (ref 12.0–15.0)
Immature Granulocytes: 0 %
Lymphocytes Relative: 7 %
Lymphs Abs: 0.6 10*3/uL — ABNORMAL LOW (ref 0.7–4.0)
MCH: 32.4 pg (ref 26.0–34.0)
MCHC: 32.7 g/dL (ref 30.0–36.0)
MCV: 98.9 fL (ref 80.0–100.0)
Monocytes Absolute: 0.7 10*3/uL (ref 0.1–1.0)
Monocytes Relative: 9 %
Neutro Abs: 6.4 10*3/uL (ref 1.7–7.7)
Neutrophils Relative %: 84 %
Platelets: 192 10*3/uL (ref 150–400)
RBC: 2.84 MIL/uL — ABNORMAL LOW (ref 3.87–5.11)
RDW: 13.7 % (ref 11.5–15.5)
WBC: 7.7 10*3/uL (ref 4.0–10.5)
nRBC: 0 % (ref 0.0–0.2)

## 2019-09-25 LAB — COMPREHENSIVE METABOLIC PANEL
ALT: 12 U/L (ref 0–44)
AST: 20 U/L (ref 15–41)
Albumin: 2.3 g/dL — ABNORMAL LOW (ref 3.5–5.0)
Alkaline Phosphatase: 61 U/L (ref 38–126)
Anion gap: 11 (ref 5–15)
BUN: 25 mg/dL — ABNORMAL HIGH (ref 8–23)
CO2: 26 mmol/L (ref 22–32)
Calcium: 8.4 mg/dL — ABNORMAL LOW (ref 8.9–10.3)
Chloride: 108 mmol/L (ref 98–111)
Creatinine, Ser: 1.58 mg/dL — ABNORMAL HIGH (ref 0.44–1.00)
GFR calc Af Amer: 34 mL/min — ABNORMAL LOW (ref >60)
GFR calc non Af Amer: 30 mL/min — ABNORMAL LOW (ref >60)
Glucose, Bld: 145 mg/dL — ABNORMAL HIGH (ref 70–99)
Potassium: 4.2 mmol/L (ref 3.5–5.1)
Sodium: 145 mmol/L (ref 135–145)
Total Bilirubin: 0.3 mg/dL (ref 0.3–1.2)
Total Protein: 4.8 g/dL — ABNORMAL LOW (ref 6.5–8.1)

## 2019-09-25 LAB — VITAMIN D 25 HYDROXY (VIT D DEFICIENCY, FRACTURES): Vit D, 25-Hydroxy: 9.16 ng/mL — ABNORMAL LOW (ref 30–100)

## 2019-09-25 MED ORDER — VITAMIN D (ERGOCALCIFEROL) 1.25 MG (50000 UNIT) PO CAPS
50000.0000 [IU] | ORAL_CAPSULE | ORAL | Status: DC
  Administered 2019-09-25: 50000 [IU] via ORAL
  Filled 2019-09-25: qty 1

## 2019-09-25 MED ORDER — SENNOSIDES-DOCUSATE SODIUM 8.6-50 MG PO TABS
2.0000 | ORAL_TABLET | Freq: Every day | ORAL | Status: DC
  Administered 2019-09-26: 2 via ORAL
  Filled 2019-09-25 (×2): qty 2

## 2019-09-25 MED ORDER — ATENOLOL 25 MG PO TABS
12.5000 mg | ORAL_TABLET | Freq: Two times a day (BID) | ORAL | Status: DC
  Administered 2019-09-25 – 2019-09-27 (×4): 12.5 mg via ORAL
  Filled 2019-09-25 (×4): qty 1

## 2019-09-25 MED ORDER — VITAMIN C 500 MG PO TABS
500.0000 mg | ORAL_TABLET | Freq: Every day | ORAL | Status: DC
  Administered 2019-09-25 – 2019-09-27 (×3): 500 mg via ORAL
  Filled 2019-09-25 (×3): qty 1

## 2019-09-25 MED ORDER — ENSURE ENLIVE PO LIQD
237.0000 mL | Freq: Every day | ORAL | Status: DC
  Administered 2019-09-25 – 2019-09-27 (×3): 237 mL via ORAL

## 2019-09-25 MED ORDER — APIXABAN 2.5 MG PO TABS
2.5000 mg | ORAL_TABLET | Freq: Two times a day (BID) | ORAL | Status: DC
  Administered 2019-09-25 – 2019-09-27 (×5): 2.5 mg via ORAL
  Filled 2019-09-25 (×5): qty 1

## 2019-09-25 MED ORDER — VITAMIN D 25 MCG (1000 UNIT) PO TABS
2000.0000 [IU] | ORAL_TABLET | Freq: Two times a day (BID) | ORAL | Status: DC
  Administered 2019-09-25 – 2019-09-27 (×5): 2000 [IU] via ORAL
  Filled 2019-09-25 (×5): qty 2

## 2019-09-25 NOTE — Care Management (Signed)
Per Lavonna Rua. W/[employer] Co-pay for Eliquis 5mg . and 2.5.mg bid $124.35 for a 30 day supply. Generic brand not available.  No PA required.,no deductible teir 3 medication,  Pt. may use any pharmacy. Pt. In her coverage gap,has no LIS. Ref# [id number]

## 2019-09-25 NOTE — Discharge Instructions (Addendum)
Information on my medicine - ELIQUIS (apixaban)  Why was Eliquis prescribed for you? Eliquis was prescribed for you to reduce the risk of a blood clot forming that can cause a stroke if you have a medical condition called atrial fibrillation (a type of irregular heartbeat).  What do You need to know about Eliquis ? Take your Eliquis TWICE DAILY - one tablet in the morning and one tablet in the evening with or without food. If you have difficulty swallowing the tablet whole please discuss with your pharmacist how to take the medication safely.  Take Eliquis exactly as prescribed by your doctor and DO NOT stop taking Eliquis without talking to the doctor who prescribed the medication.  Stopping may increase your risk of developing a stroke.  Refill your prescription before you run out.  After discharge, you should have regular check-up appointments with your healthcare provider that is prescribing your Eliquis.  In the future your dose may need to be changed if your kidney function or weight changes by a significant amount or as you get older.  What do you do if you miss a dose? If you miss a dose, take it as soon as you remember on the same day and resume taking twice daily.  Do not take more than one dose of ELIQUIS at the same time to make up a missed dose.  Important Safety Information A possible side effect of Eliquis is bleeding. You should call your healthcare provider right away if you experience any of the following: ? Bleeding from an injury or your nose that does not stop. ? Unusual colored urine (red or dark brown) or unusual colored stools (red or black). ? Unusual bruising for unknown reasons. ? A serious fall or if you hit your head (even if there is no bleeding).  Some medicines may interact with Eliquis and might increase your risk of bleeding or clotting while on Eliquis. To help avoid this, consult your healthcare provider or pharmacist prior to using any new  prescription or non-prescription medications, including herbals, vitamins, non-steroidal anti-inflammatory drugs (NSAIDs) and supplements.  This website has more information on Eliquis (apixaban): http://www.eliquis.com/eliquis/home     Orthopaedic Trauma Service Discharge Instructions   General Discharge Instructions  WEIGHT BEARING STATUS: Weightbear as tolerated Right leg with walker   RANGE OF MOTION/ACTIVITY: activity as tolerated, unrestricted range of motion Right hip and knee. Please be as active as possible   Bone health:  Your vitamin d levels are very low. Please take vitamin d supplements as they have been prescribed. You will need a bone density scan in 4-8 weeks to better assess your bone health.   Wound Care: daily dressing changes as needed starting upon discharge from the hospital.  See below  Discharge Wound Care Instructions  Do NOT apply any ointments, solutions or lotions to pin sites or surgical wounds.  These prevent needed drainage and even though solutions like hydrogen peroxide kill bacteria, they also damage cells lining the pin sites that help fight infection.  Applying lotions or ointments can keep the wounds moist and can cause them to breakdown and open up as well. This can increase the risk for infection. When in doubt call the office.  Surgical incisions should be dressed daily.  If any drainage is noted, use one layer of adaptic, then gauze, Kerlix, and an ace wrap.  Once the incision is completely dry and without drainage, it may be left open to air out.  Showering may begin 36-48  hours later.  Cleaning gently with soap and water.    Diet: as you were eating previously.  Can use over the counter stool softeners and bowel preparations, such as Miralax, to help with bowel movements.  Narcotics can be constipating.  Be sure to drink plenty of fluids  PAIN MEDICATION USE AND EXPECTATIONS  You have likely been given narcotic medications to help  control your pain.  After a traumatic event that results in an fracture (broken bone) with or without surgery, it is ok to use narcotic pain medications to help control one's pain.  We understand that everyone responds to pain differently and each individual patient will be evaluated on a regular basis for the continued need for narcotic medications. Ideally, narcotic medication use should last no more than 6-8 weeks (coinciding with fracture healing).   As a patient it is your responsibility as well to monitor narcotic medication use and report the amount and frequency you use these medications when you come to your office visit.   We would also advise that if you are using narcotic medications, you should take a dose prior to therapy to maximize you participation.  IF YOU ARE ON NARCOTIC MEDICATIONS IT IS NOT PERMISSIBLE TO OPERATE A MOTOR VEHICLE (MOTORCYCLE/CAR/TRUCK/MOPED) OR HEAVY MACHINERY DO NOT MIX NARCOTICS WITH OTHER CNS (CENTRAL NERVOUS SYSTEM) DEPRESSANTS SUCH AS ALCOHOL   STOP SMOKING OR USING NICOTINE PRODUCTS!!!!  As discussed nicotine severely impairs your body's ability to heal surgical and traumatic wounds but also impairs bone healing.  Wounds and bone heal by forming microscopic blood vessels (angiogenesis) and nicotine is a vasoconstrictor (essentially, shrinks blood vessels).  Therefore, if vasoconstriction occurs to these microscopic blood vessels they essentially disappear and are unable to deliver necessary nutrients to the healing tissue.  This is one modifiable factor that you can do to dramatically increase your chances of healing your injury.    (This means no smoking, no nicotine gum, patches, etc)  DO NOT USE NONSTEROIDAL ANTI-INFLAMMATORY DRUGS (NSAID'S)  Using products such as Advil (ibuprofen), Aleve (naproxen), Motrin (ibuprofen) for additional pain control during fracture healing can delay and/or prevent the healing response.  If you would like to take over the  counter (OTC) medication, Tylenol (acetaminophen) is ok.  However, some narcotic medications that are given for pain control contain acetaminophen as well. Therefore, you should not exceed more than 4000 mg of tylenol in a day if you do not have liver disease.  Also note that there are may OTC medicines, such as cold medicines and allergy medicines that my contain tylenol as well.  If you have any questions about medications and/or interactions please ask your doctor/PA or your pharmacist.      ICE AND ELEVATE INJURED/OPERATIVE EXTREMITY  Using ice and elevating the injured extremity above your heart can help with swelling and pain control.  Icing in a pulsatile fashion, such as 20 minutes on and 20 minutes off, can be followed.    Do not place ice directly on skin. Make sure there is a barrier between to skin and the ice pack.    Using frozen items such as frozen peas works well as the conform nicely to the are that needs to be iced.  USE AN ACE WRAP OR TED HOSE FOR SWELLING CONTROL  In addition to icing and elevation, Ace wraps or TED hose are used to help limit and resolve swelling.  It is recommended to use Ace wraps or TED hose until you are informed  to stop.    When using Ace Wraps start the wrapping distally (farthest away from the body) and wrap proximally (closer to the body)   Example: If you had surgery on your leg or thing and you do not have a splint on, start the ace wrap at the toes and work your way up to the thigh        If you had surgery on your upper extremity and do not have a splint on, start the ace wrap at your fingers and work your way up to the upper arm  IF YOU ARE IN A SPLINT OR CAST DO NOT [place]   If your splint gets wet for any reason please contact the office immediately. You may shower in your splint or cast as long as you keep it dry.  This can be done by wrapping in a cast cover or garbage back (or similar)  Do Not stick any thing down your splint  or cast such as pencils, money, or hangers to try and scratch yourself with.  If you feel itchy take benadryl as prescribed on the bottle for itching  IF YOU ARE IN A CAM BOOT (BLACK BOOT)  You may remove boot periodically. Perform daily dressing changes as noted below.  Wash the liner of the boot regularly and wear a sock when wearing the boot. It is recommended that you sleep in the boot until told otherwise    Call office for the following:  Temperature greater than 101F  Persistent nausea and vomiting  Severe uncontrolled pain  Redness, tenderness, or signs of infection (pain, swelling, redness, odor or green/yellow discharge around the site)  Difficulty breathing, headache or visual disturbances  Hives  Persistent dizziness or light-headedness  Extreme fatigue  Any other questions or concerns you may have after discharge  In an emergency, call 911 or go to an Emergency Department at a nearby hospital    [place]: [phone]   VISIT OUR WEBSITE FOR ADDITIONAL INFORMATION: [email or web address]

## 2019-09-25 NOTE — Plan of Care (Signed)
  Problem: Clinical Measurements: Goal: Ability to maintain clinical measurements within normal limits will improve Outcome: Progressing   Problem: Activity: Goal: Risk for activity intolerance will decrease Outcome: Progressing   Problem: Pain Managment: Goal: General experience of comfort will improve Outcome: Progressing   Problem: Skin Integrity: Goal: Risk for impaired skin integrity will decrease Outcome: Progressing   Problem: Pain Management: Goal: Pain level will decrease Outcome: Progressing

## 2019-09-25 NOTE — Progress Notes (Signed)
Orthopedic Trauma Service Progress Note  Patient ID: AADHYA BUSTAMANTE MRN: [id number] DOB/AGE: [birth year] 83 y.o.  Subjective:  Doing ok this am States pain controlled in R hip Spilled her milk this am and had to eat dry cheerios   No CP No SOB No N/V No abd pain   Profound vitamin d deficiency   Does not recall if she has ever been on any medications for bone health  Last bone density scan I see is from 2002, shows osteopenia.  Unsure if there are more recent ones not on our system   Lives alone, single story house, 4-5 steps to get in  [place] at baseline Since start of COVID she only goes out for MD appointments   ROS As above  Objective:   VITALS:   Vitals:   09/24/19 2014 09/25/19 0019 09/25/19 0522 09/25/19 0803  BP: (!) 164/61 (!) 113/48 140/60 (!) 125/46  Pulse: (!) 57 (!) 57 60 (!) 58  Resp:  15    Temp: 97.7 F (36.5 C) 98.3 F (36.8 C) 98.2 F (36.8 C) 97.8 F (36.6 C)  TempSrc: Oral Oral Oral Oral  SpO2: 98% 93% 96% 92%  Weight:      Height:        Estimated body mass index is 28.79 kg/m as calculated from the following:   Height as of this encounter: 5\' 6"  (1.676 m).   Weight as of this encounter: 80.9 kg.   Intake/Output      10/13 0701 - 10/14 0700 10/14 0701 - 10/15 0700   P.O. 120    I.V. (mL/kg) 16.1 (0.2)    IV Piggyback 100    Total Intake(mL/kg) 236.1 (2.9)    Urine (mL/kg/hr) 700 (0.4)    Blood 50    Total Output 750    Net -513.9           LABS  Results for orders placed or performed during the hospital encounter of 09/23/19 (from the past 24 hour(s))  CBC with Differential/Platelet     Status: Abnormal   Collection Time: 09/25/19  3:34 AM  Result Value Ref Range   WBC 7.7 4.0 - 10.5 K/uL   RBC 2.84 (L) 3.87 - 5.11 MIL/uL   Hemoglobin 9.2 (L) 12.0 - 15.0 g/dL   HCT 28.1 (L) 36.0 - 46.0 %   MCV 98.9 80.0 - 100.0 fL   MCH 32.4 26.0 - 34.0 pg   MCHC 32.7 30.0 - 36.0 g/dL   RDW 13.7 11.5 - 15.5 %   Platelets 192 150 - 400 K/uL   nRBC 0.0 0.0 - 0.2 %   Neutrophils Relative % 84 %   Neutro Abs 6.4 1.7 - 7.7 K/uL   Lymphocytes Relative 7 %   Lymphs Abs 0.6 (L) 0.7 - 4.0 K/uL   Monocytes Relative 9 %   Monocytes Absolute 0.7 0.1 - 1.0 K/uL   Eosinophils Relative 0 %   Eosinophils Absolute 0.0 0.0 - 0.5 K/uL   Basophils Relative 0 %   Basophils Absolute 0.0 0.0 - 0.1 K/uL   Immature Granulocytes 0 %   Abs Immature Granulocytes 0.03 0.00 - 0.07 K/uL  Comprehensive metabolic panel     Status: Abnormal   Collection Time: 09/25/19  3:34 AM  Result Value Ref Range   Sodium 145  135 - 145 mmol/L   Potassium 4.2 3.5 - 5.1 mmol/L   Chloride 108 98 - 111 mmol/L   CO2 26 22 - 32 mmol/L   Glucose, Bld 145 (H) 70 - 99 mg/dL   BUN 25 (H) 8 - 23 mg/dL   Creatinine, Ser 1.58 (H) 0.44 - 1.00 mg/dL   Calcium 8.4 (L) 8.9 - 10.3 mg/dL   Total Protein 4.8 (L) 6.5 - 8.1 g/dL   Albumin 2.3 (L) 3.5 - 5.0 g/dL   AST 20 15 - 41 U/L   ALT 12 0 - 44 U/L   Alkaline Phosphatase 61 38 - 126 U/L   Total Bilirubin 0.3 0.3 - 1.2 mg/dL   GFR calc non Af Amer 30 (L) >60 mL/min   GFR calc Af Amer 34 (L) >60 mL/min   Anion gap 11 5 - 15  VITAMIN D 25 Hydroxy (Vit-D Deficiency, Fractures)     Status: Abnormal   Collection Time: 09/25/19  3:34 AM  Result Value Ref Range   Vit D, 25-Hydroxy 9.16 (L) 30 - 100 ng/mL     PHYSICAL EXAM:   Gen: sitting up in bed, appears well, NAD Lungs: clear anterior fields Cardiac: RRR Abd: + BS, NTND  Ext:       Right Lower Extremity   Dressings R hip and thigh with drainage but are stable  Distal motor and sensory functions intact  Ext warm  Chronic pitting edema  + DP Pulse  SCDs in place     Assessment/Plan: 1 Day Post-Op   Active Problems:   Fall   Closed displaced intertrochanteric fracture of right femur (HCC)   Compression of lumbar vertebra (HCC)   Compression fracture of thoracic vertebra (HCC)    Vitamin D deficiency   Anti-infectives (From admission, onward)   Start     Dose/Rate Route Frequency Ordered Stop   09/25/19 0200  ceFAZolin (ANCEF) IVPB 2g/100 mL premix     2 g 200 mL/hr over 30 Minutes Intravenous Every 6 hours 09/24/19 2057 09/25/19 1359   09/24/19 1700  ceFAZolin (ANCEF) IVPB 2g/100 mL premix     2 g 200 mL/hr over 30 Minutes Intravenous  Once 09/24/19 1656 09/24/19 1723   09/24/19 0800  ceFAZolin (ANCEF) IVPB 2g/100 mL premix  Status:  Discontinued     2 g 200 mL/hr over 30 Minutes Intravenous To ShortStay Surgical 09/24/19 0134 09/24/19 0824    .  POD/HD#: 1  85 y/o female s/p fall with R hip fracture and numerous chronic medical conditions including CKD and PAF on chronic anticoagulation   - fall  - R peritrochanteric hip fracture s/p IMN   WBAT R leg   ROM as tolerated  Dressing changes as needed starting tomorrow  Ice and elevate  PT/OT  - Pain management:  Scheduled tylenol  norco as needed for severe pain   - ABL anemia/Hemodynamics  Monitor   CBC in am   - Medical issues   Per primary   - DVT/PE prophylaxis:  Restart home eliquis   Pharmacy adjusting for age and current Cr  - ID:   periop abx   - Metabolic Bone Disease:  Profound vitamin d deficiency    Supplement with daily vitamin d 3 and weekly vitamin d 2   Additional labs pending  Will need outpt dexa in 4-8 weeks  Will check care everywhere as it appears PCP with no[facility]   - Activity:  WBAT R leg   PT/OT  -  FEN/GI prophylaxis/Foley/Lines:  Reg diet   - Impediments to fracture healing:  Osteoporosis/osteopenia  Low energy fracture   Hx of recurrent endometrial cancer   - Dispo  Therapy evals      Jari Pigg, PA-C [phone] (C) 09/25/2019, 9:28 AM  [facility] [place] [phone] Domingo Sep (F)

## 2019-09-25 NOTE — Progress Notes (Signed)
Initial Nutrition Assessment  DOCUMENTATION CODES:   Not applicable  INTERVENTION:  Provide Ensure Enlive po once daily, each supplement provides 350 kcal and 20 grams of protein  Encourage adequate PO intake.   NUTRITION DIAGNOSIS:   Increased nutrient needs related to post-op healing as evidenced by estimated needs.  GOAL:   Patient will meet greater than or equal to 90% of their needs  MONITOR:   PO intake, Supplement acceptance, Skin, Weight trends, Labs, I & O's  REASON FOR ASSESSMENT:   Consult Hip fracture protocol  ASSESSMENT:   85 y.o. female with medical history significant of chronic congestive heart failure, CKD stage III, lymphedema, pretension, hyperlipidemia and Paroxysmal atrial fibrillation status post fall. Pt with right hip fracture.  PROCEDURE (10/13): INTRAMEDULLARY (IM) NAILING OF RIGHT HIP WITH OPEN REDUCTION    Pt reports having a good appetite currently and PTA with usual consumption of at least 2 meals a day. Daughter at bedside provides pt's meals at home. Current meal completion 100%. Pt with no weight loss per weight records. RD to order nutritional supplements to aid in post op healing.   NUTRITION - FOCUSED PHYSICAL EXAM:    Most Recent Value  Orbital Region  Unable to assess  Upper Arm Region  Unable to assess  Thoracic and Lumbar Region  Unable to assess  Buccal Region  Unable to assess  Temple Region  Unable to assess  Clavicle Bone Region  Unable to assess  Clavicle and Acromion Bone Region  Unable to assess  Scapular Bone Region  Unable to assess  Dorsal Hand  Unable to assess  Patellar Region  Unable to assess  Anterior Thigh Region  Unable to assess  Posterior Calf Region  Unable to assess  Edema (RD Assessment)  Unable to assess  Hair  Unable to assess  Eyes  Unable to assess  Mouth  Unable to assess  Skin  Unable to assess  Nails  Unable to assess     Labs and medications reviewed.   Diet Order:   Diet Order         Diet Heart Room service appropriate? Yes; Fluid consistency: Thin  Diet effective now              EDUCATION NEEDS:   Not appropriate for education at this time  Skin:  Skin Assessment: Skin Integrity Issues: Skin Integrity Issues:: Incisions Incisions: R hip  Last BM:  10/13  Height:   Ht Readings from Last 1 Encounters:  09/24/19 5\' 6"  (1.676 m)    Weight:   Wt Readings from Last 1 Encounters:  09/24/19 80.9 kg    Ideal Body Weight:  59 kg  BMI:  Body mass index is 28.79 kg/m.  Estimated Nutritional Needs:   Kcal:  1700-1900  Protein:  80-90 grams  Fluid:  >/= 1.7 L/day    Corrin Parker, MS, RD, LDN Pager # [phone] After hours/ weekend pager # [phone]

## 2019-09-25 NOTE — Progress Notes (Signed)
R hip surgical site shadow marked on dressing at 2015. Rounds noted shadowing outside of marked area. Charge nurse Blanch Media made aware. Reinforced dsg with ABD pads and ice applied. Monitoring continued per MD orders and Unit Protocol.

## 2019-09-25 NOTE — Evaluation (Signed)
Physical Therapy Evaluation Patient Details Name: Christina Morgan MRN: [id number] DOB: 1933-01-10 Today's Date: 09/25/2019   History of Present Illness  85 y/o female s/p fall with R hip fracture and numerous chronic medical conditions including CKD and PAF on chronic anticoagulation. IM nail right LE. PMh: chronic congestive heart failure, CKD stage III, lymphedema, pretension, hyperlipidemia and PAF.   Clinical Impression  Pt admitted with above diagnosis. Pt could not stand to RW even with mod assist. Pt was able to stand to [place] with mod assist and moved to chair.  Feel that pt is a great rehab candidate to be able to return home. Will follow acutely.   Pt currently with functional limitations due to the deficits listed below (see PT Problem List). Pt will benefit from skilled PT to increase their independence and safety with mobility to allow discharge to the venue listed below.      Follow Up Recommendations CIR    Equipment Recommendations  None recommended by PT    Recommendations for Other Services Rehab consult     Precautions / Restrictions Precautions Precautions: Fall Restrictions Weight Bearing Restrictions: Yes RLE Weight Bearing: Weight bearing as tolerated      Mobility  Bed Mobility Overal bed mobility: Needs Assistance Bed Mobility: Supine to Sit     Supine to sit: Mod assist     General bed mobility comments: Needed assist for LEs and elevation of trunk.   Transfers Overall transfer level: Needs assistance Equipment used: Rolling walker (2 wheeled) Transfers: Sit to/from [facility] Sit to Stand: Mod assist;From elevated surface Stand pivot transfers: Total assist       General transfer comment: Needed assist to power up and could not complete stand to RW even with mod assist.  Obtained Stedy and with be draised pt wasable tostand enough toplace buttock padsunder pt and then move her to the chair   Ambulation/Gait                Stairs            Wheelchair Mobility    Modified Rankin (Stroke Patients Only)       Balance Overall balance assessment: Needs assistance Sitting-balance support: No upper extremity supported;Feet supported Sitting balance-Leahy Scale: Fair     Standing balance support: Bilateral upper extremity supported;During functional activity Standing balance-Leahy Scale: Poor Standing balance comment: Pt needed bil UE support for standing.                              Pertinent Vitals/Pain Pain Assessment: Faces Faces Pain Scale: Hurts even more Pain Location: right hip Pain Descriptors / Indicators: Aching;Discomfort;Grimacing;Guarding Pain Intervention(s): Limited activity within patient's tolerance;Monitored during session;Premedicated before session;Repositioned    Home Living Family/patient expects to be discharged to:: Private residence Living Arrangements: Alone Available Help at Discharge: Family;Available PRN/intermittently;Personal care attendant(daughter comes 4 hours day, son works/ aide 2 xwk bath) Type of Home: House Home Access: Stairs to enter Entrance Stairs-Rails: Right;Left;Can reach both Entrance Stairs-Number of Steps: 4 Home Layout: One level Home Equipment: Shower seat;Bedside commode;Walker - 2 wheels;Transport chair;Adaptive equipment(lift chair)      Prior Function Level of Independence: Needs assistance   Gait / Transfers Assistance Needed: Modif I with walker/ uses lift chair all day  ADL's / Homemaking Assistance Needed: aide sponge bathing pt 2 x week recently because of leg wraps but used to sit on chair in shower  Hand Dominance   Dominant Hand: Right    Extremity/Trunk Assessment   Upper Extremity Assessment Upper Extremity Assessment: Defer to OT evaluation    Lower Extremity Assessment Lower Extremity Assessment: RLE deficits/detail RLE: Unable to fully assess due to pain    Cervical / Trunk  Assessment Cervical / Trunk Assessment: Kyphotic  Communication   Communication: No difficulties  Cognition Arousal/Alertness: Awake/alert Behavior During Therapy: WFL for tasks assessed/performed Overall Cognitive Status: Within Functional Limits for tasks assessed                                        General Comments      Exercises Total Joint Exercises Ankle Circles/Pumps: AROM;Both;10 reps;Supine Quad Sets: AROM;Both;10 reps;Seated Long Arc Quad: AROM;Both;10 reps;Seated   Assessment/Plan    PT Assessment Patient needs continued PT services  PT Problem List Decreased strength;Decreased range of motion;Decreased activity tolerance;Decreased balance;Decreased mobility;Decreased knowledge of use of DME;Decreased safety awareness;Decreased knowledge of precautions;Pain       PT Treatment Interventions DME instruction;Gait training;Functional mobility training;Therapeutic activities;Therapeutic exercise;Balance training;Patient/family education;Stair training    PT Goals (Current goals can be found in the Care Plan section)  Acute Rehab PT Goals Patient Stated Goal: to go home PT Goal Formulation: With patient Time For Goal Achievement: 10/09/19 Potential to Achieve Goals: Good    Frequency Min 4X/week   Barriers to discharge Decreased caregiver support(does not have 24 huor care)      Co-evaluation               AM-PAC PT "6 Clicks" Mobility  Outcome Measure Help needed turning from your back to your side while in a flat bed without using bedrails?: A Lot Help needed moving from lying on your back to sitting on the side of a flat bed without using bedrails?: A Lot Help needed moving to and from a bed to a chair (including a wheelchair)?: A Lot Help needed standing up from a chair using your arms (e.g., wheelchair or bedside chair)?: A Lot Help needed to walk in hospital room?: Total Help needed climbing 3-5 steps with a railing? : Total 6  Click Score: 10    End of Session Equipment Utilized During Treatment: Gait belt Activity Tolerance: Patient limited by fatigue Patient left: in chair;with call bell/phone within reach;with chair alarm set Nurse Communication: Mobility status;Need for lift equipment(stedy) PT Visit Diagnosis: Unsteadiness on feet (R26.81);Muscle weakness (generalized) (M62.81);Pain Pain - Right/Left: Right Pain - part of body: Leg    Time: 7591-6384 PT Time Calculation (min) (ACUTE ONLY): 30 min   Charges:   PT Evaluation $PT Eval Moderate Complexity: 1 Mod PT Treatments $Therapeutic Activity: 8-22 mins        Harm Jou,PT Acute Rehabilitation Services Pager:  [phone]  Office:  [phone]    Denice Paradise 09/25/2019, 12:49 PM

## 2019-09-25 NOTE — Progress Notes (Signed)
PROGRESS NOTE    Christina Morgan  QQP:[id number] DOB: 1933/01/29 DOA: 09/23/2019 PCP: Christina Carol, MD  Brief Narrative: 85 y.o.femalewith medical history significant ofchronic congestive heart failure, CKD stage III, lymphedema, pretension, hyperlipidemia and Paroxysmal atrial fibrillation later status post fall. Patient lives alone at home. According to the daughter she was in her usual state of health until 930 this morning when she spoke to her daughter on the phone and she thinks patient fell somewhere between 1030 and 11. Patient does not know how she fell but she denies loss of consciousness chest pain shortness of breath nausea vomiting abdominal pain diarrhea urinary complaints headache changes in vision. Patient has been gaining weight progressively she gained weight from 1 76-1 81 gradually.  ED Course:Labs in the ED sodium 141 potassium 3.8 BUN 21 creatinine 1.49 white count 8.9 hemoglobin 11.4 platelet count 226. Covidis pending. Echocardiogram February 2020 with normal ejection fraction. X-ray thoracic spine status post T11 and T12 kyphoplasty new compression deformities noted T4-T5 and T7 concerning for fractures of indeterminate age, L1 compression fracture  09/25/2019 patient resting in bed feels pain is controlled with medications denies any chest pain or shortness of breath  Assessment & Plan:   Active Problems:   Fall   Closed displaced intertrochanteric fracture of right femur ([place])   Compression of lumbar vertebra ([place])   Compression fracture of thoracic vertebra ([place])   Vitamin D deficiency   #1 right hip fracture is post intramedullary nailing 09/24/2019.  Weightbearing as tolerated.   Range of motion as tolerated.   PT OT eval pending.   Ortho following.   Tylenol for pain control with Norco as needed  Stool softeners   #2 history of chronic atrial fibrillation heart rate in the mid 50s to high 50s.  Decrease the dose of metoprolol.  Eliquis  restarted.  Continue flecainide.  Held hydralazine due to soft blood pressure.  #3 history of diastolic heart failure with normal ejection fraction EF 65 to 70% echo 09/24/2019.  Her Demadex has not been restarted yet.  She still appears dry evaluate her daily and restart Demadex as needed.   #4 hypertension   on the soft side monitor closely 125/46.    #5 elevated creatinine-Demadex still on hold she received slow IV hydration yesterday creatinine stable  #6 vitamin D deficiency level of 9.7-patient started on vitamin D3 2000 units twice a day with vitamin D2 50,000 units q. Weekly.  #7 hyperlipidemia continue Lipitor  #8 history of recurrent endometrial cancer Topamax to be restarted 09/30/2019.   Estimated body mass index is 28.79 kg/m as calculated from the following:   Height as of this encounter: 5\' 6"  (1.676 m).   Weight as of this encounter: 80.9 kg.  DVT prophylaxis: Eliquis code Status:Full code Family Communication:Discussed with daughter Christina Morgan disposition Plan PT OT eval pending might need SNF consults called:cardiology    Subjective: Patient resting in bed upset that she spilled milk and a dry cereal denies having a bowel movement since admission to hospital passing flatus   objective: Vitals:   09/24/19 2014 09/25/19 0019 09/25/19 0522 09/25/19 0803  BP: (!) 164/61 (!) 113/48 140/60 (!) 125/46  Pulse: (!) 57 (!) 57 60 (!) 58  Resp:  15    Temp: 97.7 F (36.5 C) 98.3 F (36.8 C) 98.2 F (36.8 C) 97.8 F (36.6 C)  TempSrc: Oral Oral Oral Oral  SpO2: 98% 93% 96% 92%  Weight:      Height:  Intake/Output Summary (Last 24 hours) at 09/25/2019 1212 Last data filed at 09/25/2019 0522 Gross per 24 hour  Intake 236.11 ml  Output 750 ml  Net -513.89 ml   Filed Weights   09/24/19 0135  Weight: 80.9 kg    Examination:  General exam: Appears calm and comfortable  Respiratory system: Clear to auscultation. Respiratory effort  normal. Cardiovascular system: S1 & S2 heard, RRR. No JVD, murmurs, rubs, gallops or clicks. No pedal edema. Gastrointestinal system: Abdomen is nondistended, soft and nontender. No organomegaly or masses felt. Normal bowel sounds heard. Central nervous system: Alert and oriented. No focal neurological deficits. Extremities: Right hip covered with dressing the dressing is clean dry and intact 1+ pitting edema skin: No rashes, lesions or ulcers Psychiatry: Judgement and insight appear normal. Mood & affect appropriate.     Data Reviewed: I have personally reviewed following labs and imaging studies  CBC: Recent Labs  Lab 09/23/19 1307 09/24/19 0254 09/25/19 0334  WBC 8.9 7.6 7.7  NEUTROABS 7.1 6.1 6.4  HGB 11.4* 10.5* 9.2*  HCT 36.3 31.8* 28.1*  MCV 100.0 98.8 98.9  PLT 226 197 130   Basic Metabolic Panel: Recent Labs  Lab 09/23/19 1307 09/24/19 0254 09/25/19 0334  NA 141 142 145  K 3.8 4.6 4.2  CL 104 105 108  CO2 28 26 26   GLUCOSE 118* 118* 145*  BUN 21 23 25*  CREATININE 1.49* 1.60* 1.58*  CALCIUM 8.6* 8.5* 8.4*   GFR: Estimated Creatinine Clearance: 28.4 mL/min (A) (by C-G formula based on SCr of 1.58 mg/dL (H)). Liver Function Tests: Recent Labs  Lab 09/24/19 0254 09/25/19 0334  AST 21 20  ALT 13 12  ALKPHOS 74 61  BILITOT 1.2 0.3  PROT 5.3* 4.8*  ALBUMIN 2.8* 2.3*   No results for input(s): LIPASE, AMYLASE in the last 168 hours. No results for input(s): AMMONIA in the last 168 hours. Coagulation Profile: No results for input(s): INR, PROTIME in the last 168 hours. Cardiac Enzymes: No results for input(s): CKTOTAL, CKMB, CKMBINDEX, TROPONINI in the last 168 hours. BNP (last 3 results) Recent Labs    01/04/19 1306  PROBNP 593   HbA1C: No results for input(s): HGBA1C in the last 72 hours. CBG: No results for input(s): GLUCAP in the last 168 hours. Lipid Profile: No results for input(s): CHOL, HDL, LDLCALC, TRIG, CHOLHDL, LDLDIRECT in the last  72 hours. Thyroid Function Tests: No results for input(s): TSH, T4TOTAL, FREET4, T3FREE, THYROIDAB in the last 72 hours. Anemia Panel: No results for input(s): VITAMINB12, FOLATE, FERRITIN, TIBC, IRON, RETICCTPCT in the last 72 hours. Sepsis Labs: No results for input(s): PROCALCITON, LATICACIDVEN in the last 168 hours.  Recent Results (from the past 240 hour(s))  SARS CORONAVIRUS 2 (TAT 6-24 HRS) Nasopharyngeal Nasopharyngeal Swab     Status: None   Collection Time: 09/23/19  4:58 PM   Specimen: Nasopharyngeal Swab  Result Value Ref Range Status   SARS Coronavirus 2 NEGATIVE NEGATIVE Final    Comment: (NOTE) SARS-CoV-2 target nucleic acids are NOT DETECTED. The SARS-CoV-2 RNA is generally detectable in upper and lower respiratory specimens during the acute phase of infection. Negative results do not preclude SARS-CoV-2 infection, do not rule out co-infections with other pathogens, and should not be used as the sole basis for treatment or other patient management decisions. Negative results must be combined with clinical observations, patient history, and epidemiological information. The expected result is Negative. Fact Sheet for Patients: SugarRoll.be Fact Sheet for Healthcare Providers: https://www.[employer].com/  This test is not yet approved or cleared by the [place] and  has been authorized for detection and/or diagnosis of SARS-CoV-2 by FDA under an Emergency Use Authorization (EUA). This EUA will remain  in effect (meaning this test can be used) for the duration of the COVID-19 declaration under Section 56 4(b)(1) of the Act, 21 U.S.C. section 360bbb-3(b)(1), unless the authorization is terminated or revoked sooner. Performed at [facility], [place] [address] [place], [place] [address]   MRSA PCR Screening     Status: Abnormal   Collection Time: 09/24/19  2:16 AM   Specimen: Nasal Mucosa; Nasopharyngeal   Result Value Ref Range Status   MRSA by PCR POSITIVE (A) NEGATIVE Final    Comment:        The GeneXpert MRSA Assay (FDA approved for NASAL specimens only), is one component of a comprehensive MRSA colonization surveillance program. It is not intended to diagnose MRSA infection nor to guide or monitor treatment for MRSA infections. RESULT CALLED TO, READ BACK BY AND VERIFIED WITH: WORTH,S RN [id number] 09/24/2019 MITCHELL,L Performed at [facility], [place] [address], [place], [place] [address]          Radiology Studies: Dg Chest 1 View  Result Date: 09/23/2019 CLINICAL DATA:  Fall. EXAM: CHEST  1 VIEW COMPARISON:  March 30, 2018. FINDINGS: Mild cardiomegaly is noted. Atherosclerosis of thoracic aorta is noted. No pneumothorax is noted. Lungs are clear. Bony thorax is unremarkable. IMPRESSION: No acute cardiopulmonary abnormality seen. Aortic Atherosclerosis (ICD10-I70.0). Electronically Signed   By: Marijo Conception M.D.   On: 09/23/2019 15:58   Dg Thoracic Spine 2 View  Result Date: 09/23/2019 CLINICAL DATA:  Back pain after fall. EXAM: THORACIC SPINE 2 VIEWS COMPARISON:  Apr 24, 2019. FINDINGS: Status post T11 and T12 kyphoplasty. New compression deformities are seen involving the T7, T5 and T4 vertebral bodies concerning for fractures of indeterminate age. No spondylolisthesis is noted. Diffuse osteopenia is noted. IMPRESSION: Status post T11 and T12 kyphoplasty. New compression deformities are noted at T4, T5 and T7 concerning for fractures of indeterminate age. MRI may be performed for further evaluation. Electronically Signed   By: Marijo Conception M.D.   On: 09/23/2019 15:55   Dg Lumbar Spine Complete  Result Date: 09/23/2019 CLINICAL DATA:  Low back pain after fall. EXAM: LUMBAR SPINE - COMPLETE 4+ VIEW COMPARISON:  December 18, 2017. FINDINGS: Mild levoscoliosis of lumbar spine is noted. Status post kyphoplasty of T11 and T12 vertebral bodies. Diffuse osteopenia is noted.  Severe compression deformity of L1 vertebral body is noted concerning for fracture of indeterminate age. Mild degenerative disc disease is noted at L2-3, L3-4 and L4-5. IMPRESSION: Interval development of compression deformity of L1 vertebral body concerning for fracture of indeterminate age. MRI may be performed to evaluate for acute fracture. Multilevel degenerative disc disease is noted. Diffuse osteopenia. Electronically Signed   By: Marijo Conception M.D.   On: 09/23/2019 15:57   Ct Head Wo Contrast  Result Date: 09/23/2019 CLINICAL DATA:  Posttraumatic headache after fall. No loss of consciousness. EXAM: CT HEAD WITHOUT CONTRAST CT CERVICAL SPINE WITHOUT CONTRAST TECHNIQUE: Multidetector CT imaging of the head and cervical spine was performed following the standard protocol without intravenous contrast. Multiplanar CT image reconstructions of the cervical spine were also generated. COMPARISON:  None. FINDINGS: CT HEAD FINDINGS Brain: Mild chronic ischemic white matter disease is noted. Large calcified left parietal meningioma is noted. No mass effect or  midline shift is noted. No hemorrhage or acute infarction is noted. Ventricular size is within normal limits. Vascular: No hyperdense vessel or unexpected calcification. Skull: Normal. Negative for fracture or focal lesion. Sinuses/Orbits: No acute finding. Other: None. CT CERVICAL SPINE FINDINGS Alignment: Minimal grade 1 anterolisthesis of C5-6 is noted secondary to posterior facet joint hypertrophy. Skull base and vertebrae: No acute fracture. No primary bone lesion or focal pathologic process. Soft tissues and spinal canal: No prevertebral fluid or swelling. No visible canal hematoma. Disc levels: Mild degenerative disc disease is noted at C5-6 and C6-7. Upper chest: Negative. Other: Degenerative changes are seen involving the right-sided posterior facet joints. IMPRESSION: Mild chronic ischemic white matter disease. Large calcified left parietal  meningioma. No acute intracranial abnormality seen. Mild multilevel degenerative disc disease. No acute abnormality seen in the cervical spine. Electronically Signed   By: Marijo Conception M.D.   On: 09/23/2019 14:14   Ct Cervical Spine Wo Contrast  Result Date: 09/23/2019 CLINICAL DATA:  Posttraumatic headache after fall. No loss of consciousness. EXAM: CT HEAD WITHOUT CONTRAST CT CERVICAL SPINE WITHOUT CONTRAST TECHNIQUE: Multidetector CT imaging of the head and cervical spine was performed following the standard protocol without intravenous contrast. Multiplanar CT image reconstructions of the cervical spine were also generated. COMPARISON:  None. FINDINGS: CT HEAD FINDINGS Brain: Mild chronic ischemic white matter disease is noted. Large calcified left parietal meningioma is noted. No mass effect or midline shift is noted. No hemorrhage or acute infarction is noted. Ventricular size is within normal limits. Vascular: No hyperdense vessel or unexpected calcification. Skull: Normal. Negative for fracture or focal lesion. Sinuses/Orbits: No acute finding. Other: None. CT CERVICAL SPINE FINDINGS Alignment: Minimal grade 1 anterolisthesis of C5-6 is noted secondary to posterior facet joint hypertrophy. Skull base and vertebrae: No acute fracture. No primary bone lesion or focal pathologic process. Soft tissues and spinal canal: No prevertebral fluid or swelling. No visible canal hematoma. Disc levels: Mild degenerative disc disease is noted at C5-6 and C6-7. Upper chest: Negative. Other: Degenerative changes are seen involving the right-sided posterior facet joints. IMPRESSION: Mild chronic ischemic white matter disease. Large calcified left parietal meningioma. No acute intracranial abnormality seen. Mild multilevel degenerative disc disease. No acute abnormality seen in the cervical spine. Electronically Signed   By: Marijo Conception M.D.   On: 09/23/2019 14:14   Pelvis Portable  Result Date:  09/24/2019 CLINICAL DATA:  Postoperative right hip intramedullary nail placement EXAM: PORTABLE PELVIS 1-2 VIEWS COMPARISON:  Same day radiographs FINDINGS: Post open reduction internal fixation of the comminuted intertrochanteric right femur fracture with extension into the proximal femoral shaft. There is been placement of an intramedullary nail and transcervical cancellous screw. Alignment across the transfixed fracture line is near anatomic. Displaced fracture fragment involving the lesser trochanter is in similar positioning to preoperative management. Catheter projects over the midline pelvis. Mild degenerative changes are noted in both hips. Expected postsurgical soft tissue changes are noted about the right hip with overlying swelling and small amount of intra-articular gas. IMPRESSION: 1. Post open reduction internal fixation of the comminuted intertrochanteric right femur fracture without evidence of hardware complication. 2. Displaced fracture fragment involving the lesser trochanter is in similar positioning to preoperative management. Electronically Signed   By: Lovena Le M.D.   On: 09/24/2019 23:04   Dg C-arm 1-60 Min  Result Date: 09/24/2019 CLINICAL DATA:  Proximal right femur fracture. EXAM: RIGHT FEMUR 2 VIEWS; DG C-ARM 1-60 MIN COMPARISON:  Radiographs dated 09/23/2019  FINDINGS: Multiple C-arm images demonstrate the patient undergoing open reduction and internal fixation of the comminuted fracture of the proximal right femoral shaft and lesser trochanter. Intramedullary nail and lag screw have been inserted. Major fragments are near anatomic in alignment and position. Lesser trochanter remains avulsed and displaced. IMPRESSION: Open reduction and internal fixation of comminuted fracture of the proximal right femur. Electronically Signed   By: Lorriane Shire M.D.   On: 09/24/2019 21:05   Dg Hip Unilat W Or Wo Pelvis 2-3 Views Right  Result Date: 09/23/2019 CLINICAL DATA:  Right hip  pain after fall. EXAM: DG HIP (WITH OR WITHOUT PELVIS) 2-3V RIGHT COMPARISON:  None. FINDINGS: Severely displaced and comminuted fracture is seen involving the intertrochanteric region of the proximal right femur. Vascular calcifications are noted. Right femoral head remains within the acetabulum. IMPRESSION: Severely displaced and comminuted intertrochanteric fracture of proximal right femur. Electronically Signed   By: Marijo Conception M.D.   On: 09/23/2019 15:53   Dg Femur, Min 2 Views Right  Result Date: 09/24/2019 CLINICAL DATA:  Proximal right femur fracture. EXAM: RIGHT FEMUR 2 VIEWS; DG C-ARM 1-60 MIN COMPARISON:  Radiographs dated 09/23/2019 FINDINGS: Multiple C-arm images demonstrate the patient undergoing open reduction and internal fixation of the comminuted fracture of the proximal right femoral shaft and lesser trochanter. Intramedullary nail and lag screw have been inserted. Major fragments are near anatomic in alignment and position. Lesser trochanter remains avulsed and displaced. IMPRESSION: Open reduction and internal fixation of comminuted fracture of the proximal right femur. Electronically Signed   By: Lorriane Shire M.D.   On: 09/24/2019 21:05   Dg Femur Port, Min 2 Views Right  Result Date: 09/24/2019 CLINICAL DATA:  Postop, right hip intramedullary nail placement EXAM: RIGHT FEMUR PORTABLE 2 VIEW COMPARISON:  Same day radiographs FINDINGS: Patient is post intramedullary nail and transcervical cancellous screw placement transfixing the previously described intertrochanteric right femur fracture which extends into the proximal femoral diaphysis. Hardware is secured distally by 2 fully threaded, transversely oriented fixation screws in the distal diaphysis. A displaced fracture fragment involving the lesser trochanters in similar positioning to the preoperative radiographs. Femoral head remains normally located. There is diffuse soft tissue swelling of the right lower extremity in  expected postoperative soft tissue gas and intra-articular gas. Catheter tubing overlies the right upper leg. Vascular calcium noted in the medial thigh. Alignment at the knee is grossly preserved though incompletely evaluated on non dedicated exam. IMPRESSION: 1. Post intramedullary nail and transcervical right femur fracture fixation without evidence of hardware complication. 2. Displaced fracture fragment involving the lesser trochanters in similar positioning to the preoperative radiographs. Electronically Signed   By: Lovena Le M.D.   On: 09/24/2019 23:06        Scheduled Meds:  acetaminophen  500 mg Oral Q8H   apixaban  2.5 mg Oral BID   atenolol  50 mg Oral BID   atorvastatin  10 mg Oral QHS   cholecalciferol  2,000 Units Oral BID   docusate sodium  100 mg Oral BID   flecainide  100 mg Oral BID   hydrALAZINE  10 mg Oral Q8H   influenza vaccine adjuvanted  0.5 mL Intramuscular Tomorrow-1000   mupirocin ointment  1 application Nasal BID   vitamin C  500 mg Oral Daily   Vitamin D (Ergocalciferol)  50,000 Units Oral Q7 days   Continuous Infusions:   LOS: 2 days     Georgette Shell, MD [facility]  If  7PM-7AM, please contact night-coverage [email or web address] Password [email or web address] 09/25/2019, 12:12 PM

## 2019-09-25 NOTE — Care Management (Addendum)
CM consult acknowledged to assist with any HH/DME needs. Awaiting PT/OT eval for DCP recommendations and will continue to follow.  Eliquis benefits check sent and pending.   Midge Minium RN, BSN, NCM-BC, ACM-RN [phone]

## 2019-09-25 NOTE — Progress Notes (Signed)
Spoke with RN patient no longer needs IV team at this time. Patient has another working PIV

## 2019-09-25 NOTE — Progress Notes (Signed)
Rehab Admissions Coordinator Note:  Patient was screened by Michel Santee for appropriateness for an Inpatient Acute Rehab Consult.  At this time, we are recommending Inpatient Rehab consult.  Michel Santee 09/25/2019, 5:19 PM  I can be reached at [phone].

## 2019-09-26 DIAGNOSIS — E559 Vitamin D deficiency, unspecified: Secondary | ICD-10-CM

## 2019-09-26 LAB — BASIC METABOLIC PANEL
Anion gap: 8 (ref 5–15)
BUN: 32 mg/dL — ABNORMAL HIGH (ref 8–23)
CO2: 26 mmol/L (ref 22–32)
Calcium: 8.1 mg/dL — ABNORMAL LOW (ref 8.9–10.3)
Chloride: 108 mmol/L (ref 98–111)
Creatinine, Ser: 1.61 mg/dL — ABNORMAL HIGH (ref 0.44–1.00)
GFR calc Af Amer: 34 mL/min — ABNORMAL LOW (ref >60)
GFR calc non Af Amer: 29 mL/min — ABNORMAL LOW (ref >60)
Glucose, Bld: 107 mg/dL — ABNORMAL HIGH (ref 70–99)
Potassium: 3.7 mmol/L (ref 3.5–5.1)
Sodium: 142 mmol/L (ref 135–145)

## 2019-09-26 LAB — BPAM RBC
Blood Product Expiration Date: 202011062359
Blood Product Expiration Date: 202011062359
Unit Type and Rh: 6200
Unit Type and Rh: 6200

## 2019-09-26 LAB — CALCITRIOL (1,25 DI-OH VIT D): Vit D, 1,25-Dihydroxy: 17.4 pg/mL — ABNORMAL LOW (ref 19.9–79.3)

## 2019-09-26 LAB — CBC
HCT: 23.2 % — ABNORMAL LOW (ref 36.0–46.0)
Hemoglobin: 7.4 g/dL — ABNORMAL LOW (ref 12.0–15.0)
MCH: 32 pg (ref 26.0–34.0)
MCHC: 31.9 g/dL (ref 30.0–36.0)
MCV: 100.4 fL — ABNORMAL HIGH (ref 80.0–100.0)
Platelets: 205 10*3/uL (ref 150–400)
RBC: 2.31 MIL/uL — ABNORMAL LOW (ref 3.87–5.11)
RDW: 13.9 % (ref 11.5–15.5)
WBC: 7.2 10*3/uL (ref 4.0–10.5)
nRBC: 0 % (ref 0.0–0.2)

## 2019-09-26 LAB — TYPE AND SCREEN
ABO/RH(D): A POS
Antibody Screen: NEGATIVE
Unit division: 0
Unit division: 0

## 2019-09-26 LAB — ABO/RH: ABO/RH(D): A POS

## 2019-09-26 LAB — PREALBUMIN: Prealbumin: 12.2 mg/dL — ABNORMAL LOW (ref 18–38)

## 2019-09-26 LAB — PREPARE RBC (CROSSMATCH)

## 2019-09-26 LAB — TSH: TSH: 5.394 u[IU]/mL — ABNORMAL HIGH (ref 0.350–4.500)

## 2019-09-26 MED ORDER — FUROSEMIDE 10 MG/ML IJ SOLN
20.0000 mg | Freq: Once | INTRAMUSCULAR | Status: AC
  Administered 2019-09-27: 20 mg via INTRAVENOUS
  Filled 2019-09-26: qty 2

## 2019-09-26 MED ORDER — ACETAMINOPHEN 325 MG PO TABS
650.0000 mg | ORAL_TABLET | Freq: Once | ORAL | Status: AC
  Administered 2019-09-26: 650 mg via ORAL
  Filled 2019-09-26: qty 2

## 2019-09-26 MED ORDER — SODIUM CHLORIDE 0.9% IV SOLUTION: Freq: Once | INTRAVENOUS | Status: DC

## 2019-09-26 NOTE — Progress Notes (Signed)
Physical Therapy Treatment Patient Details Name: Christina Morgan MRN: [id number] DOB: [birth year] Today's Date: 09/26/2019    History of Present Illness 85 y/o female s/p fall with R hip fracture and numerous chronic medical conditions including CKD and PAF on chronic anticoagulation. IM nail right LE. PMh: chronic congestive heart failure, CKD stage III, lymphedema, pretension, hyperlipidemia and PAF.     PT Comments    Pt admitted with above diagnosis. Pt was able unable to stand to RW with mod assist of 2.  Was able to stand to [facility] with heavy mod assist of 2.  Limited by pain in right LE today. Worked on exercises as well with pt having diffficulty moving right LE overall.   Pt currently with functional limitations due to the deficits listed below (see PT Problem List). Pt will benefit from skilled PT to increase their independence and safety with mobility to allow discharge to the venue listed below.     Follow Up Recommendations  CIR     Equipment Recommendations  None recommended by PT    Recommendations for Other Services Rehab consult     Precautions / Restrictions Precautions Precautions: Fall Restrictions Weight Bearing Restrictions: Yes RLE Weight Bearing: Weight bearing as tolerated    Mobility  Bed Mobility Overal bed mobility: Needs Assistance Bed Mobility: Supine to Sit     Supine to sit: Mod assist;+2 for physical assistance     General bed mobility comments: Needed more assist for LEs especially assist with right LE and elevation of trunk.   Transfers Overall transfer level: Needs assistance Equipment used: Rolling walker (2 wheeled) Transfers: Sit to/from [facility] Sit to Stand: Mod assist;From elevated surface;+2 physical assistance Stand pivot transfers: Total assist;+2 safety/equipment       General transfer comment: Needed assist to power up and could not complete stand to RW even with mod assist of 2.  Obtained Stedy and  with bed raised pt was able to stand enough to place buttock pads under pt and then move her to the chair   Needed much more assist today due to pain right LE and not moving right LE as much today.   Ambulation/Gait                 Stairs             Wheelchair Mobility    Modified Rankin (Stroke Patients Only)       Balance Overall balance assessment: Needs assistance Sitting-balance support: No upper extremity supported;Feet supported Sitting balance-Leahy Scale: Fair     Standing balance support: Bilateral upper extremity supported;During functional activity Standing balance-Leahy Scale: Poor Standing balance comment: Pt needed bil UE support for standing.                             Cognition Arousal/Alertness: Awake/alert Behavior During Therapy: WFL for tasks assessed/performed Overall Cognitive Status: Within Functional Limits for tasks assessed                                        Exercises Total Joint Exercises Ankle Circles/Pumps: AROM;Both;10 reps;Supine Quad Sets: AROM;Both;10 reps;Seated Heel Slides: AAROM;Right;5 reps;Supine Hip ABduction/ADduction: AAROM;Right;5 reps;Supine Long Arc Quad: AROM;Both;Seated;5 reps    General Comments        Pertinent Vitals/Pain Pain Assessment: Faces Faces Pain Scale: Hurts worst Pain Location: right  hip Pain Descriptors / Indicators: Aching;Discomfort;Grimacing;Guarding Pain Intervention(s): Limited activity within patient's tolerance;Monitored during session;Repositioned    Home Living                      Prior Function            PT Goals (current goals can now be found in the care plan section) Acute Rehab PT Goals Patient Stated Goal: to go home Progress towards PT goals: Progressing toward goals    Frequency    Min 4X/week      PT Plan Current plan remains appropriate    Co-evaluation              AM-PAC PT "6 Clicks" Mobility    Outcome Measure  Help needed turning from your back to your side while in a flat bed without using bedrails?: A Lot Help needed moving from lying on your back to sitting on the side of a flat bed without using bedrails?: A Lot Help needed moving to and from a bed to a chair (including a wheelchair)?: A Lot Help needed standing up from a chair using your arms (e.g., wheelchair or bedside chair)?: A Lot Help needed to walk in hospital room?: Total Help needed climbing 3-5 steps with a railing? : Total 6 Click Score: 10    End of Session Equipment Utilized During Treatment: Gait belt Activity Tolerance: Patient limited by fatigue;Patient limited by pain Patient left: in chair;with call bell/phone within reach;with chair alarm set Nurse Communication: Mobility status;Need for lift equipment(stedy) PT Visit Diagnosis: Unsteadiness on feet (R26.81);Muscle weakness (generalized) (M62.81);Pain Pain - Right/Left: Right Pain - part of body: Leg     Time: 4967-5916 PT Time Calculation (min) (ACUTE ONLY): 33 min  Charges:  $Therapeutic Exercise: 8-22 mins $Therapeutic Activity: 8-22 mins                     [place] Pager:  [phone]  Office:  [place] 09/26/2019, 10:59 AM

## 2019-09-26 NOTE — Plan of Care (Signed)
  Problem: Education: Goal: Knowledge of General Education information will improve Description: Including pain rating scale, medication(s)/side effects and non-pharmacologic comfort measures Outcome: Progressing   Problem: Clinical Measurements: Goal: Will remain free from infection Outcome: Progressing   Problem: Activity: Goal: Risk for activity intolerance will decrease Outcome: Progressing   Problem: Coping: Goal: Level of anxiety will decrease Outcome: Progressing   Problem: Pain Managment: Goal: General experience of comfort will improve Outcome: Progressing   

## 2019-09-26 NOTE — Progress Notes (Signed)
PROGRESS NOTE    Christina Morgan  GEZ:[id number] DOB: [birth year] DOA: 09/23/2019 PCP: Seward Carol, MD   Brief Narrative: 85 y.o.femalewith medical history significant ofchronic congestive heart failure, CKD stage III, lymphedema, pretension, hyperlipidemia and Paroxysmal atrial fibrillation later status post fall. Patient lives alone at home. According to the daughter she was in her usual state of health until 930 this morning when she spoke to her daughter on the phone and she thinks patient fell somewhere between 1030 and 58. Patient does not know how she fell but she denies loss of consciousness chest pain shortness of breath nausea vomiting abdominal pain diarrhea urinary complaints headache changes in vision. Patient has been gaining weight progressively she gained weight from 1 76-1 81 gradually.  ED Course:Labs in the ED sodium 141 potassium 3.8 BUN 21 creatinine 1.49 white count 8.9 hemoglobin 11.4 platelet count 226. Covidis pending. Echocardiogram February 2020 with normal ejection fraction. X-ray thoracic spine status post T11 and T12 kyphoplasty new compression deformities noted T4-T5 and T7 concerning for fractures of indeterminate age, L1 compression fracture 09/25/2019 patient resting in bed feels pain is controlled with medications denies any chest pain or shortness of breath 10/15-reports pain is worse today.  At the bedside chair.  Just worked with physical therapy this morning.  Subjective: Just worked with physical therapy reports pain appears much worse today.  On room air no nausea vomiting fever or chills.  Assessment & Plan:   Closed displaced intertrochanteric fracture of right femur: Secondary to fall status post IM nailing 10/13.  Continue weightbearing as tolerated PT OT Tylenol/opiates stool softener and pain control .  PT has advised inpatient rehab and I have consulted.  Vitamin D deficiency: Continue to replete with weekly dose 50,000 units and  D3 2000 units twice a day.  Vitamin D 9.7.  Hyperlipidemia continue Lipitor  History of recurrent endometrial cancer  History of diastolic CHF with normal EF of 65 to 70%.  Demadex has not been started yet appears dry and evaluate daily and resume soon  History of chronic atrial fibrillation heart rate fairly stable metoprolol dose decreased due to soft/low heart rate, back on Eliquis, flecainide.  Chronic leg swelling/Lymphedema reports he is: Going to lymphedema clinic soon  AKI on CKD stage IIIa baseline creatinine around 1.0-1.4, continue oral hydration.  Acute on chronic anemia suspected multifactorial from hemodilution, acute blood loss due to fracture/surgery.  Baseline hemoglobin around 12On admission hb at 11.4, now at 7.4 gm. Repeat CBC in the morning will likely need transfusion, she agrees with transfusion if needed understands risk benefits and alternatives.  Nutrition: Nutrition Problem: Increased nutrient needs Etiology: post-op healing Signs/Symptoms: estimated needs Interventions: Ensure Enlive (each supplement provides 350kcal and 20 grams of protein)  Body mass index is 28.79 kg/m.   DVT prophylaxis: eliquis Code Status: full Family Communication: plan of care discussed with patient in detail.no family at bedside Disposition Plan: Remains inpatient pending clinical improvement,, waiting for CIR, pending stabilization of hemoglobin.   Consultants: Orthopedic surgery  Procedures: IM nailing 10/13  Microbiology:  Antimicrobials: Anti-infectives (From admission, onward)   Start     Dose/Rate Route Frequency Ordered Stop   09/25/19 0200  ceFAZolin (ANCEF) IVPB 2g/100 mL premix     2 g 200 mL/hr over 30 Minutes Intravenous Every 6 hours 09/24/19 2057 09/25/19 1028   09/24/19 1700  ceFAZolin (ANCEF) IVPB 2g/100 mL premix     2 g 200 mL/hr over 30 Minutes Intravenous  Once 09/24/19 1656 09/24/19  1723   09/24/19 0800  ceFAZolin (ANCEF) IVPB 2g/100 mL premix   Status:  Discontinued     2 g 200 mL/hr over 30 Minutes Intravenous To ShortStay Surgical 09/24/19 0134 09/24/19 0824       Objective: Vitals:   09/25/19 1656 09/25/19 2015 09/26/19 0455 09/26/19 0804  BP: (!) 111/41 (!) 121/51 (!) 121/47 (!) 129/41  Pulse: 62 71 62 61  Resp: 15 17 18 17   Temp: 97.7 F (36.5 C) 98.5 F (36.9 C) 98.2 F (36.8 C) 97.9 F (36.6 C)  TempSrc: Oral Oral Oral Oral  SpO2: 100% 98% 99% 100%  Weight:      Height:        Intake/Output Summary (Last 24 hours) at 09/26/2019 1454 Last data filed at 09/26/2019 0455 Gross per 24 hour  Intake -  Output 400 ml  Net -400 ml   Filed Weights   09/24/19 0135  Weight: 80.9 kg   Weight change:   Body mass index is 28.79 kg/m.  Intake/Output from previous day: 10/14 0701 - 10/15 0700 In: 240 [P.O.:240] Out: 400 [Urine:400] Intake/Output this shift: No intake/output data recorded.  Examination:  General exam: Appears calm and comfortable,Not in distress, older fore the age HEENT:PERRL,Oral mucosa moist, Ear/Nose normal on gross exam Respiratory system: Bilateral equal air entry, normal vesicular breath sounds, no wheezes or crackles  Cardiovascular system: S1 & S2 heard,No JVD, murmurs. Gastrointestinal system: Abdomen is  soft, non tender, non distended, BS +  Nervous System:Alert and oriented. No focal neurological deficits/moving extremities, sensation intact. Extremities: Right hip surgical site clean dry intact dressing in place.  No edema, no clubbing, distal peripheral pulses palpable. Skin: No rashes, lesions, no icterus MSK: Normal muscle bulk,tone ,power  Medications:  Scheduled Meds: . acetaminophen  500 mg Oral Q8H  . apixaban  2.5 mg Oral BID  . atenolol  12.5 mg Oral BID  . atorvastatin  10 mg Oral QHS  . cholecalciferol  2,000 Units Oral BID  . docusate sodium  100 mg Oral BID  . feeding supplement (ENSURE ENLIVE)  237 mL Oral Q1500  . flecainide  100 mg Oral BID  . mupirocin  ointment  1 application Nasal BID  . senna-docusate  2 tablet Oral QHS  . vitamin C  500 mg Oral Daily  . Vitamin D (Ergocalciferol)  50,000 Units Oral Q7 days   Continuous Infusions:  Data Reviewed: I have personally reviewed following labs and imaging studies  CBC: Recent Labs  Lab 09/23/19 1307 09/24/19 0254 09/25/19 0334 09/26/19 0654  WBC 8.9 7.6 7.7 7.2  NEUTROABS 7.1 6.1 6.4  --   HGB 11.4* 10.5* 9.2* 7.4*  HCT 36.3 31.8* 28.1* 23.2*  MCV 100.0 98.8 98.9 100.4*  PLT 226 197 192 093   Basic Metabolic Panel: Recent Labs  Lab 09/23/19 1307 09/24/19 0254 09/25/19 0334 09/26/19 0654  NA 141 142 145 142  K 3.8 4.6 4.2 3.7  CL 104 105 108 108  CO2 28 26 26 26   GLUCOSE 118* 118* 145* 107*  BUN 21 23 25* 32*  CREATININE 1.49* 1.60* 1.58* 1.61*  CALCIUM 8.6* 8.5* 8.4* 8.1*   GFR: Estimated Creatinine Clearance: 27.9 mL/min (A) (by C-G formula based on SCr of 1.61 mg/dL (H)). Liver Function Tests: Recent Labs  Lab 09/24/19 0254 09/25/19 0334  AST 21 20  ALT 13 12  ALKPHOS 74 61  BILITOT 1.2 0.3  PROT 5.3* 4.8*  ALBUMIN 2.8* 2.3*   No results for  input(s): LIPASE, AMYLASE in the last 168 hours. No results for input(s): AMMONIA in the last 168 hours. Coagulation Profile: No results for input(s): INR, PROTIME in the last 168 hours. Cardiac Enzymes: No results for input(s): CKTOTAL, CKMB, CKMBINDEX, TROPONINI in the last 168 hours. BNP (last 3 results) Recent Labs    01/04/19 1306  PROBNP 593   HbA1C: No results for input(s): HGBA1C in the last 72 hours. CBG: No results for input(s): GLUCAP in the last 168 hours. Lipid Profile: No results for input(s): CHOL, HDL, LDLCALC, TRIG, CHOLHDL, LDLDIRECT in the last 72 hours. Thyroid Function Tests: Recent Labs    09/26/19 0654  TSH 5.394*   Anemia Panel: No results for input(s): VITAMINB12, FOLATE, FERRITIN, TIBC, IRON, RETICCTPCT in the last 72 hours. Sepsis Labs: No results for input(s): PROCALCITON,  LATICACIDVEN in the last 168 hours.  Recent Results (from the past 240 hour(s))  SARS CORONAVIRUS 2 (TAT 6-24 HRS) Nasopharyngeal Nasopharyngeal Swab     Status: None   Collection Time: 09/23/19  4:58 PM   Specimen: Nasopharyngeal Swab  Result Value Ref Range Status   SARS Coronavirus 2 NEGATIVE NEGATIVE Final    Comment: (NOTE) SARS-CoV-2 target nucleic acids are NOT DETECTED. The SARS-CoV-2 RNA is generally detectable in upper and lower respiratory specimens during the acute phase of infection. Negative results do not preclude SARS-CoV-2 infection, do not rule out co-infections with other pathogens, and should not be used as the sole basis for treatment or other patient management decisions. Negative results must be combined with clinical observations, patient history, and epidemiological information. The expected result is Negative. Fact Sheet for Patients: SugarRoll.be Fact Sheet for Healthcare Providers: https://www.woods-mathews.com/ This test is not yet approved or cleared by the [place] FDA and  has been authorized for detection and/or diagnosis of SARS-CoV-2 by FDA under an Emergency Use Authorization (EUA). This EUA will remain  in effect (meaning this test can be used) for the duration of the COVID-19 declaration under Section 56 4(b)(1) of the Act, 21 U.S.C. section 360bbb-3(b)(1), unless the authorization is terminated or revoked sooner. Performed at [facility], [place] [address], [place], [place] [address]   MRSA PCR Screening     Status: Abnormal   Collection Time: 09/24/19  2:16 AM   Specimen: Nasal Mucosa; Nasopharyngeal  Result Value Ref Range Status   MRSA by PCR POSITIVE (A) NEGATIVE Final    Comment:        The GeneXpert MRSA Assay (FDA approved for NASAL specimens only), is one component of a comprehensive MRSA colonization surveillance program. It is not intended to diagnose MRSA infection nor to  guide or monitor treatment for MRSA infections. RESULT CALLED TO, READ BACK BY AND VERIFIED WITH: WORTH,S RN [phone] 09/24/2019 MITCHELL,L Performed at [facility], [place] [address], [place], [place] [address]       Radiology Studies: Pelvis Portable  Result Date: 09/24/2019 CLINICAL DATA:  Postoperative right hip intramedullary nail placement EXAM: PORTABLE PELVIS 1-2 VIEWS COMPARISON:  Same day radiographs FINDINGS: Post open reduction internal fixation of the comminuted intertrochanteric right femur fracture with extension into the proximal femoral shaft. There is been placement of an intramedullary nail and transcervical cancellous screw. Alignment across the transfixed fracture line is near anatomic. Displaced fracture fragment involving the lesser trochanter is in similar positioning to preoperative management. Catheter projects over the midline pelvis. Mild degenerative changes are noted in both hips. Expected postsurgical soft tissue changes are noted about the right  hip with overlying swelling and small amount of intra-articular gas. IMPRESSION: 1. Post open reduction internal fixation of the comminuted intertrochanteric right femur fracture without evidence of hardware complication. 2. Displaced fracture fragment involving the lesser trochanter is in similar positioning to preoperative management. Electronically Signed   By: Lovena Le M.D.   On: 09/24/2019 23:04   Dg C-arm 1-60 Min  Result Date: 09/24/2019 CLINICAL DATA:  Proximal right femur fracture. EXAM: RIGHT FEMUR 2 VIEWS; DG C-ARM 1-60 MIN COMPARISON:  Radiographs dated 09/23/2019 FINDINGS: Multiple C-arm images demonstrate the patient undergoing open reduction and internal fixation of the comminuted fracture of the proximal right femoral shaft and lesser trochanter. Intramedullary nail and lag screw have been inserted. Major fragments are near anatomic in alignment and position. Lesser trochanter remains avulsed and  displaced. IMPRESSION: Open reduction and internal fixation of comminuted fracture of the proximal right femur. Electronically Signed   By: Lorriane Shire M.D.   On: 09/24/2019 21:05   Dg Femur, Min 2 Views Right  Result Date: 09/24/2019 CLINICAL DATA:  Proximal right femur fracture. EXAM: RIGHT FEMUR 2 VIEWS; DG C-ARM 1-60 MIN COMPARISON:  Radiographs dated 09/23/2019 FINDINGS: Multiple C-arm images demonstrate the patient undergoing open reduction and internal fixation of the comminuted fracture of the proximal right femoral shaft and lesser trochanter. Intramedullary nail and lag screw have been inserted. Major fragments are near anatomic in alignment and position. Lesser trochanter remains avulsed and displaced. IMPRESSION: Open reduction and internal fixation of comminuted fracture of the proximal right femur. Electronically Signed   By: Lorriane Shire M.D.   On: 09/24/2019 21:05   Dg Femur Port, Min 2 Views Right  Result Date: 09/24/2019 CLINICAL DATA:  Postop, right hip intramedullary nail placement EXAM: RIGHT FEMUR PORTABLE 2 VIEW COMPARISON:  Same day radiographs FINDINGS: Patient is post intramedullary nail and transcervical cancellous screw placement transfixing the previously described intertrochanteric right femur fracture which extends into the proximal femoral diaphysis. Hardware is secured distally by 2 fully threaded, transversely oriented fixation screws in the distal diaphysis. A displaced fracture fragment involving the lesser trochanters in similar positioning to the preoperative radiographs. Femoral head remains normally located. There is diffuse soft tissue swelling of the right lower extremity in expected postoperative soft tissue gas and intra-articular gas. Catheter tubing overlies the right upper leg. Vascular calcium noted in the medial thigh. Alignment at the knee is grossly preserved though incompletely evaluated on non dedicated exam. IMPRESSION: 1. Post intramedullary nail  and transcervical right femur fracture fixation without evidence of hardware complication. 2. Displaced fracture fragment involving the lesser trochanters in similar positioning to the preoperative radiographs. Electronically Signed   By: Lovena Le M.D.   On: 09/24/2019 23:06      LOS: 3 days   Time spent: More than 50% of that time was spent in counseling and/or coordination of care.  Antonieta Pert, MD [facility]  09/26/2019, 2:54 PM

## 2019-09-26 NOTE — Progress Notes (Signed)
Inpatient Rehabilitation Admissions Coordinator  PT recommends CIR, OT recommends SNF. To be transfused. I will follow up for full consult and discuss with patient and family her options for rehab.  Danne Baxter, RN, MSN Rehab Admissions Coordinator [phone] 09/26/2019 4:40 PM

## 2019-09-26 NOTE — Progress Notes (Signed)
Orthopedic Trauma Service Progress Note  Patient ID: Christina Morgan MRN: [id number] DOB/AGE: April 05, 1933 85 y.o.  Subjective:  Doing ok  Pain tolerable   Therapy notes reviewed  Pt did get dizzy with therapy today   No CP or SOB No N/V   ROS As above   Objective:   VITALS:   Vitals:   09/25/19 1656 09/25/19 2015 09/26/19 0455 09/26/19 0804  BP: (!) 111/41 (!) 121/51 (!) 121/47 (!) 129/41  Pulse: 62 71 62 61  Resp: 15 17 18 17   Temp: 97.7 F (36.5 C) 98.5 F (36.9 C) 98.2 F (36.8 C) 97.9 F (36.6 C)  TempSrc: Oral Oral Oral Oral  SpO2: 100% 98% 99% 100%  Weight:      Height:        Estimated body mass index is 28.79 kg/m as calculated from the following:   Height as of this encounter: 5\' 6"  (1.676 m).   Weight as of this encounter: 80.9 kg.   Intake/Output      10/14 0701 - 10/15 0700 10/15 0701 - 10/16 0700   P.O. 240    I.V. (mL/kg)     IV Piggyback     Total Intake(mL/kg) 240 (3)    Urine (mL/kg/hr) 400 (0.2)    Blood     Total Output 400    Net -160           LABS  Results for orders placed or performed during the hospital encounter of 09/23/19 (from the past 24 hour(s))  Prealbumin     Status: Abnormal   Collection Time: 09/26/19  6:54 AM  Result Value Ref Range   Prealbumin 12.2 (L) 18 - 38 mg/dL  TSH     Status: Abnormal   Collection Time: 09/26/19  6:54 AM  Result Value Ref Range   TSH 5.394 (H) 0.350 - 4.500 uIU/mL  CBC     Status: Abnormal   Collection Time: 09/26/19  6:54 AM  Result Value Ref Range   WBC 7.2 4.0 - 10.5 K/uL   RBC 2.31 (L) 3.87 - 5.11 MIL/uL   Hemoglobin 7.4 (L) 12.0 - 15.0 g/dL   HCT 23.2 (L) 36.0 - 46.0 %   MCV 100.4 (H) 80.0 - 100.0 fL   MCH 32.0 26.0 - 34.0 pg   MCHC 31.9 30.0 - 36.0 g/dL   RDW 13.9 11.5 - 15.5 %   Platelets 205 150 - 400 K/uL   nRBC 0.0 0.0 - 0.2 %  Basic metabolic panel     Status: Abnormal   Collection  Time: 09/26/19  6:54 AM  Result Value Ref Range   Sodium 142 135 - 145 mmol/L   Potassium 3.7 3.5 - 5.1 mmol/L   Chloride 108 98 - 111 mmol/L   CO2 26 22 - 32 mmol/L   Glucose, Bld 107 (H) 70 - 99 mg/dL   BUN 32 (H) 8 - 23 mg/dL   Creatinine, Ser 1.61 (H) 0.44 - 1.00 mg/dL   Calcium 8.1 (L) 8.9 - 10.3 mg/dL   GFR calc non Af Amer 29 (L) >60 mL/min   GFR calc Af Amer 34 (L) >60 mL/min   Anion gap 8 5 - 15     PHYSICAL EXAM:   Gen: sitting up in bed, NAD, appears well, very pleasant  Lungs: breathing unlabored Cardiac:regular  Ext:        Right Lower Extremity              Dressings R hip and thigh with drainage but are stable             Distal motor and sensory functions intact             Ext warm             Chronic pitting edema             + DP Pulse             SCDs in place   Assessment/Plan: 2 Days Post-Op   Active Problems:   Fall   Closed displaced intertrochanteric fracture of right femur ([place])   Compression of lumbar vertebra ([place])   Compression fracture of thoracic vertebra ([place])   Vitamin D deficiency   Anti-infectives (From admission, onward)   Start     Dose/Rate Route Frequency Ordered Stop   09/25/19 0200  ceFAZolin (ANCEF) IVPB 2g/100 mL premix     2 g 200 mL/hr over 30 Minutes Intravenous Every 6 hours 09/24/19 2057 09/25/19 1028   09/24/19 1700  ceFAZolin (ANCEF) IVPB 2g/100 mL premix     2 g 200 mL/hr over 30 Minutes Intravenous  Once 09/24/19 1656 09/24/19 1723   09/24/19 0800  ceFAZolin (ANCEF) IVPB 2g/100 mL premix  Status:  Discontinued     2 g 200 mL/hr over 30 Minutes Intravenous To [facility] 09/24/19 0134 09/24/19 0824    .  POD/HD#:2   85 y/o female s/p fall with R hip fracture and numerous chronic medical conditions including CKD and PAF on chronic anticoagulation    - fall   - R peritrochanteric hip fracture s/p IMN              WBAT R leg              ROM as tolerated             Dressing changes as needed               Ice and elevate             PT/OT   - Pain management:             Scheduled tylenol             norco as needed for severe pain    - ABL anemia/Hemodynamics             pt symptomatic with PT/OT  Also slightly worsened renal function + cardiac disease  Will transfuse today, 2 units PRBCs   Think this will help her participation with therapy     - Medical issues              Per primary    - DVT/PE prophylaxis:             Restart home eliquis                         Pharmacy adjusting for age and current Cr   - ID:              periop abx completed    - Metabolic Bone Disease:             Profound vitamin d deficiency  Supplement with daily vitamin d 3 and weekly vitamin d 2                         Additional labs pending             Will need outpt dexa in 4-8 weeks             Will check care everywhere as it appears PCP with [facility]   Calcitriol low as well, this indicates longstanding vitamin d deficiency    - Activity:             WBAT R leg              PT/OT   - FEN/GI prophylaxis/Foley/Lines:             Reg diet    - Impediments to fracture healing:             Osteoporosis/osteopenia             Low energy fracture              Hx of recurrent endometrial cancer    - Dispo             Therapies  SW consult for snf  At this point think pt more likely headed for SNF but will see how she does with therapy tomorrow    Jari Pigg, PA-C [phone] (C) 09/26/2019, 4:19 PM  [facility] [place] [address] [phone] Domingo Sep (F)

## 2019-09-26 NOTE — Evaluation (Signed)
Occupational Therapy Evaluation Patient Details Name: Christina Morgan MRN: [id number] DOB: 1933-12-09 Today's Date: 09/26/2019    History of Present Illness 85 y/o female s/p fall with R hip fracture and numerous chronic medical conditions including CKD and PAF on chronic anticoagulation. IM nail right LE. PMh: chronic congestive heart failure, CKD stage III, lymphedema, pretension, hyperlipidemia and PAF.    Clinical Impression   Pt with decline in function and safety with ADLs and ADL mobility with impaired strength, balance and endurance. Pt reports that PTA, she lived at home alone and used a RW for mobility and had PCA to assist with bathing/showers. Pt has a lift chair at home and states that she isn't very active. Pt currently requires max - mod A with bed mobility, min A with UB ADLs, max - total A with LB ADLs and was unable stand or transfer with OT this session. Pt would benefit from acute OT services to address impairments to maximize level of function and safety    Follow Up Recommendations  SNF    Equipment Recommendations  Other (comment)(TBD at next venue of care)    Recommendations for Other Services       Precautions / Restrictions Precautions Precautions: Fall Restrictions Weight Bearing Restrictions: Yes RLE Weight Bearing: Weight bearing as tolerated      Mobility Bed Mobility Overal bed mobility: Needs Assistance Bed Mobility: Supine to Sit;Sit to Supine     Supine to sit: Max assist;Mod assist Sit to supine: Max assist   General bed mobility comments: Needed more assist for LEs especially assist with right LE and elevation of trunk.   Transfers Overall transfer level: Needs assistance               General transfer comment: unable due to pain and fatigue, PT used Stedy in earlier session today    Balance Overall balance assessment: Needs assistance Sitting-balance support: No upper extremity supported;Feet supported Sitting  balance-Leahy Scale: Fair                                     ADL either performed or assessed with clinical judgement   ADL Overall ADL's : Needs assistance/impaired Eating/Feeding: Independent;Sitting   Grooming: Wash/dry hands;Wash/dry face;Min guard;Sitting   Upper Body Bathing: Sitting;Minimal assistance   Lower Body Bathing: Maximal assistance   Upper Body Dressing : Minimal assistance;Sitting   Lower Body Dressing: Total assistance     Toilet Transfer Details (indicate cue type and reason): unable. will need Stedy or +2 assist Toileting- Clothing Manipulation and Hygiene: Total assistance;Bed level       Functional mobility during ADLs: Total assistance;Moderate assistance;+2 for physical assistance;+2 for safety/equipment(unable with OT, however mod A +2 - total A with Stedy with PT)       Vision Baseline Vision/History: Wears glasses Wears Glasses: At all times Patient Visual Report: No change from baseline       Perception     Praxis      Pertinent Vitals/Pain Pain Assessment: Faces Faces Pain Scale: Hurts even more Pain Location: right hip with movement Pain Descriptors / Indicators: Aching;Discomfort;Grimacing;Guarding Pain Intervention(s): Limited activity within patient's tolerance;Monitored during session;Premedicated before session;Repositioned     Hand Dominance Right   Extremity/Trunk Assessment Upper Extremity Assessment Upper Extremity Assessment: Generalized weakness   Lower Extremity Assessment Lower Extremity Assessment: Defer to PT evaluation   Cervical / Trunk Assessment Cervical / Trunk Assessment: Kyphotic  Communication Communication Communication: No difficulties   Cognition Arousal/Alertness: Awake/alert Behavior During Therapy: WFL for tasks assessed/performed Overall Cognitive Status: Within Functional Limits for tasks assessed                                     General Comments        Exercises     Shoulder Instructions      Home Living Family/patient expects to be discharged to:: Private residence Living Arrangements: Alone Available Help at Discharge: Family;Available PRN/intermittently;Personal care attendant Type of Home: House Home Access: Stairs to enter [employer] of Steps: 4 Entrance Stairs-Rails: Right;Left;Can reach both Home Layout: One level     Bathroom Shower/Tub: Teacher, early years/pre: Standard     Home Equipment: Shower seat;Bedside commode;Walker - 2 wheels;Transport chair;Adaptive equipment Adaptive Equipment: Reacher        Prior Functioning/Environment    Gait / Transfers Assistance Needed: Mod I with walker/ uses lift chair all day ADL's / Homemaking Assistance Needed: aide sponge bathing pt 2 x week recently because of leg wraps but used to sit on chair in tub shower            OT Problem List: Decreased strength;Impaired balance (sitting and/or standing);Pain;Decreased activity tolerance;Decreased knowledge of use of DME or AE      OT Treatment/Interventions: Self-care/ADL training;DME and/or AE instruction;Therapeutic activities;Patient/family education    OT Goals(Current goals can be found in the care plan section) Acute Rehab OT Goals Patient Stated Goal: to go home OT Goal Formulation: With patient/family Time For Goal Achievement: 10/10/19 ADL Goals Pt Will Perform Grooming: with set-up;with supervision;sitting Pt Will Perform Upper Body Bathing: with set-up;with supervision;sitting Pt Will Perform Lower Body Bathing: with mod assist;sitting/lateral leans Pt Will Perform Upper Body Dressing: with set-up;with supervision;sitting Pt Will Transfer to Toilet: with max assist;with mod assist;stand pivot transfer;bedside commode Additional ADL Goal #1: pt will complete bed mobility min A to sit EOB for ADLs/selfcare  OT Frequency: Min 2X/week   Barriers to D/C: Decreased caregiver support           Co-evaluation              AM-PAC OT "6 Clicks" Daily Activity     Outcome Measure Help from another person eating meals?: None Help from another person taking care of personal grooming?: A Little Help from another person toileting, which includes using toliet, bedpan, or urinal?: Total Help from another person bathing (including washing, rinsing, drying)?: Total Help from another person to put on and taking off regular upper body clothing?: A Little Help from another person to put on and taking off regular lower body clothing?: Total 6 Click Score: 13   End of Session    Activity Tolerance: No increased pain;Patient limited by fatigue Patient left: in bed;with call bell/phone within reach;with family/visitor present  OT Visit Diagnosis: Other abnormalities of gait and mobility (R26.89);Muscle weakness (generalized) (M62.81);History of falling (Z91.81);Pain Pain - Right/Left: Right Pain - part of body: Hip                Time: 1322-1350 OT Time Calculation (min): 28 min Charges:  OT Evaluation $OT Eval Moderate Complexity: 1 Mod    Britt Bottom 09/26/2019, 3:23 PM

## 2019-09-26 NOTE — TOC Initial Note (Signed)
Transition of Care [facility]) - Initial/Assessment Note    Patient Details  Name: Christina Morgan MRN: [id number] Date of Birth: 04/18/1933  Transition [facility] CM/SW Contact:    Midge Minium RN, BSN, NCM-BC, ACM-RN [phone] Phone Number: 09/26/2019, 3:43 PM  Clinical Narrative:                 CM following for dispositional needs. 85 y/o female s/p fall with R hip fracture and IM nail right LE. CM spoke to the patients daughter, Christina Morgan, via phone to discuss the POC and answered questions accordingly. PT/OT eval completed with CIR vs SNF recommended. Patient lived at home alone; utilized DME to assist with her ADLs and has a personal care aide to assist during bathing. Rehab [facility] following for appropriateness, with Dr. Lupita Leash contacted to request an IP Rehab order. CM will pursue a dual DCP in case patient is denied CIR. CM team will continue to follow.   Expected Discharge Plan: IP Rehab Facility Barriers to Discharge: Continued Medical Work up   Patient Goals and CMS Choice        Expected Discharge Plan and Services Expected Discharge Plan: IP Rehab Facility In-house Referral: NA Discharge Planning Services: CM Consult Post Acute Care Choice: IP Rehab Living arrangements for the past 2 months: Single Family Home                 DME Arranged: N/A DME Agency: NA       HH Arranged: NA HH Agency: NA        Prior Living Arrangements/Services Living arrangements for the past 2 months: Single Family Home Lives with:: Self Patient language and need for interpreter reviewed:: Yes Do you feel safe going back to the place where you live?: Yes      Need for Family Participation in Patient Care: Yes (Comment) Care giver support system in place?: Yes (comment) Current home services: DME Criminal Activity/Legal Involvement Pertinent to Current Situation/Hospitalization: No - Comment as needed  Activities of Daily Living Home Assistive Devices/Equipment: Environmental consultant  (specify type), Wheelchair, Eyeglasses, Shower chair with back ADL Screening (condition at time of admission) Patient's cognitive ability adequate to safely complete daily activities?: Yes Is the patient deaf or have difficulty hearing?: No Does the patient have difficulty seeing, even when wearing glasses/contacts?: No Does the patient have difficulty concentrating, remembering, or making decisions?: No Patient able to express need for assistance with ADLs?: Yes Does the patient have difficulty dressing or bathing?: Yes Independently performs ADLs?: No Communication: Independent Dressing (OT): Needs assistance Is this a change from baseline?: Pre-admission baseline Grooming: Needs assistance Is this a change from baseline?: Pre-admission baseline Feeding: Independent Bathing: Needs assistance Is this a change from baseline?: Pre-admission baseline Toileting: Needs assistance Is this a change from baseline?: Pre-admission baseline In/Out Bed: Needs assistance Is this a change from baseline?: Pre-admission baseline Walks in Home: Needs assistance Is this a change from baseline?: Pre-admission baseline Does the patient have difficulty walking or climbing stairs?: Yes Weakness of Legs: Both Weakness of Arms/Hands: Both(hands)  Permission Sought/Granted Permission sought to share information with : Case Manager, Chartered certified accountant granted to share information with : Yes, Verbal Permission Granted     Permission granted to share info w AGENCY: Placement facilities        Emotional Assessment       Orientation: : Oriented to Self, Oriented to Place, Oriented to  Time, Oriented to Situation Alcohol / Substance Use: Not Applicable  Psych Involvement: No (comment)  Admission diagnosis:  Closed displaced intertrochanteric fracture of right femur, initial encounter ([place]) [S72.141A] Compression fracture of lumbar vertebra, initial encounter, unspecified lumbar  vertebral level ([place]) [S32.000A] Compression fracture of thoracic vertebra, initial encounter, unspecified thoracic vertebral level ([place]) [S22.000A] Patient Active Problem List   Diagnosis Date Noted  . Vitamin D deficiency 09/25/2019  . Closed displaced intertrochanteric fracture of right femur ([place])   . Compression of lumbar vertebra ([place])   . Compression fracture of thoracic vertebra ([facility])   . Fall 09/23/2019  . SOB (shortness of breath) 01/04/2019  . Bilateral lower leg cellulitis 03/30/2018  . Chronic diastolic heart failure ([place]) 03/30/2018  . Edema of extremities 08/19/2014  . Essential hypertension, benign 05/15/2014  . Mixed hyperlipidemia 05/15/2014  . Persistent atrial fibrillation ([place]) 05/15/2014  . Endometrial ca ([place]) 12/28/2012   PCP:  Seward Carol, MD Pharmacy:   [facility], [address] [place] [address] Phone: [phone] Fax: [phone]     Social Determinants of Health (SDOH) Interventions    Readmission Risk Interventions No flowsheet data found.

## 2019-09-27 ENCOUNTER — Inpatient Hospital Stay (HOSPITAL_COMMUNITY)
Admission: AD | Admit: 2019-09-27 | Discharge: 2019-10-12 | DRG: 560 | Disposition: A | Discharge status: Home Health | Payer: Medicare Other | Source: Intra-hospital | Attending: Physical Medicine and Rehabilitation | Admitting: Physical Medicine and Rehabilitation

## 2019-09-27 ENCOUNTER — Encounter (HOSPITAL_COMMUNITY): Payer: Self-pay

## 2019-09-27 DIAGNOSIS — Z8249 Family history of ischemic heart disease and other diseases of the circulatory system: Secondary | ICD-10-CM | POA: Diagnosis not present

## 2019-09-27 DIAGNOSIS — D62 Acute posthemorrhagic anemia: Secondary | ICD-10-CM | POA: Diagnosis not present

## 2019-09-27 DIAGNOSIS — N183 Chronic kidney disease, stage 3 unspecified: Secondary | ICD-10-CM | POA: Diagnosis present

## 2019-09-27 DIAGNOSIS — Z880 Allergy status to penicillin: Secondary | ICD-10-CM

## 2019-09-27 DIAGNOSIS — E785 Hyperlipidemia, unspecified: Secondary | ICD-10-CM | POA: Diagnosis present

## 2019-09-27 DIAGNOSIS — I130 Hypertensive heart and chronic kidney disease with heart failure and stage 1 through stage 4 chronic kidney disease, or unspecified chronic kidney disease: Secondary | ICD-10-CM | POA: Diagnosis present

## 2019-09-27 DIAGNOSIS — S7221XS Displaced subtrochanteric fracture of right femur, sequela: Secondary | ICD-10-CM | POA: Diagnosis not present

## 2019-09-27 DIAGNOSIS — K5903 Drug induced constipation: Secondary | ICD-10-CM | POA: Diagnosis not present

## 2019-09-27 DIAGNOSIS — R0602 Shortness of breath: Secondary | ICD-10-CM

## 2019-09-27 DIAGNOSIS — Z20828 Contact with and (suspected) exposure to other viral communicable diseases: Secondary | ICD-10-CM | POA: Diagnosis present

## 2019-09-27 DIAGNOSIS — I890 Lymphedema, not elsewhere classified: Secondary | ICD-10-CM | POA: Diagnosis present

## 2019-09-27 DIAGNOSIS — S72102G Unspecified trochanteric fracture of left femur, subsequent encounter for closed fracture with delayed healing: Secondary | ICD-10-CM

## 2019-09-27 DIAGNOSIS — G8929 Other chronic pain: Secondary | ICD-10-CM | POA: Diagnosis present

## 2019-09-27 DIAGNOSIS — S7221XD Displaced subtrochanteric fracture of right femur, subsequent encounter for closed fracture with routine healing: Principal | ICD-10-CM

## 2019-09-27 DIAGNOSIS — I480 Paroxysmal atrial fibrillation: Secondary | ICD-10-CM | POA: Diagnosis present

## 2019-09-27 DIAGNOSIS — Z79899 Other long term (current) drug therapy: Secondary | ICD-10-CM | POA: Diagnosis not present

## 2019-09-27 DIAGNOSIS — I10 Essential (primary) hypertension: Secondary | ICD-10-CM | POA: Diagnosis not present

## 2019-09-27 DIAGNOSIS — Z90710 Acquired absence of both cervix and uterus: Secondary | ICD-10-CM | POA: Diagnosis not present

## 2019-09-27 DIAGNOSIS — N1831 Chronic kidney disease, stage 3a: Secondary | ICD-10-CM

## 2019-09-27 DIAGNOSIS — N1832 Chronic kidney disease, stage 3b: Secondary | ICD-10-CM

## 2019-09-27 DIAGNOSIS — Z7901 Long term (current) use of anticoagulants: Secondary | ICD-10-CM | POA: Diagnosis not present

## 2019-09-27 DIAGNOSIS — R062 Wheezing: Secondary | ICD-10-CM | POA: Diagnosis not present

## 2019-09-27 DIAGNOSIS — Z8542 Personal history of malignant neoplasm of other parts of uterus: Secondary | ICD-10-CM

## 2019-09-27 DIAGNOSIS — E8889 Other specified metabolic disorders: Secondary | ICD-10-CM | POA: Diagnosis present

## 2019-09-27 DIAGNOSIS — I5032 Chronic diastolic (congestive) heart failure: Secondary | ICD-10-CM | POA: Diagnosis not present

## 2019-09-27 LAB — HEMOGLOBIN AND HEMATOCRIT, BLOOD
HCT: 33.5 % — ABNORMAL LOW (ref 36.0–46.0)
Hemoglobin: 10.9 g/dL — ABNORMAL LOW (ref 12.0–15.0)

## 2019-09-27 LAB — BASIC METABOLIC PANEL
Anion gap: 10 (ref 5–15)
BUN: 31 mg/dL — ABNORMAL HIGH (ref 8–23)
CO2: 25 mmol/L (ref 22–32)
Calcium: 8.1 mg/dL — ABNORMAL LOW (ref 8.9–10.3)
Chloride: 104 mmol/L (ref 98–111)
Creatinine, Ser: 1.48 mg/dL — ABNORMAL HIGH (ref 0.44–1.00)
GFR calc Af Amer: 37 mL/min — ABNORMAL LOW (ref >60)
GFR calc non Af Amer: 32 mL/min — ABNORMAL LOW (ref >60)
Glucose, Bld: 106 mg/dL — ABNORMAL HIGH (ref 70–99)
Potassium: 3.9 mmol/L (ref 3.5–5.1)
Sodium: 139 mmol/L (ref 135–145)

## 2019-09-27 LAB — PTH, INTACT AND CALCIUM
Calcium, Total (PTH): 8 mg/dL — ABNORMAL LOW (ref 8.7–10.3)
PTH: 60 pg/mL (ref 15–65)

## 2019-09-27 MED ORDER — ENSURE ENLIVE PO LIQD: 237.0000 mL | Freq: Every day | ORAL | 12 refills | Status: DC

## 2019-09-27 MED ORDER — ATENOLOL 25 MG PO TABS: 12.5000 mg | ORAL_TABLET | Freq: Two times a day (BID) | ORAL | Status: DC

## 2019-09-27 MED ORDER — ONDANSETRON HCL 4 MG PO TABS
4.0000 mg | ORAL_TABLET | Freq: Four times a day (QID) | ORAL | Status: DC | PRN
  Administered 2019-10-09: 4 mg via ORAL
  Filled 2019-09-27: qty 1

## 2019-09-27 MED ORDER — APIXABAN 2.5 MG PO TABS
2.5000 mg | ORAL_TABLET | Freq: Two times a day (BID) | ORAL | Status: DC
  Administered 2019-09-27 – 2019-10-12 (×30): 2.5 mg via ORAL
  Filled 2019-09-27 (×30): qty 1

## 2019-09-27 MED ORDER — POLYETHYLENE GLYCOL 3350 17 G PO PACK: 17.0000 g | PACK | Freq: Every day | ORAL | Status: DC | PRN

## 2019-09-27 MED ORDER — SORBITOL 70 % SOLN: 30.0000 mL | Freq: Every day | Status: DC | PRN

## 2019-09-27 MED ORDER — TORSEMIDE 20 MG PO TABS: 20.0000 mg | ORAL_TABLET | Freq: Two times a day (BID) | ORAL | 3 refills | Status: DC

## 2019-09-27 MED ORDER — HYDROCODONE-ACETAMINOPHEN 5-325 MG PO TABS
1.0000 | ORAL_TABLET | Freq: Four times a day (QID) | ORAL | Status: DC | PRN
  Administered 2019-10-01 – 2019-10-02 (×4): 1 via ORAL
  Administered 2019-10-03 – 2019-10-04 (×2): 2 via ORAL
  Administered 2019-10-05: 1 via ORAL
  Administered 2019-10-06 – 2019-10-07 (×2): 2 via ORAL
  Filled 2019-09-27 (×3): qty 1
  Filled 2019-09-27: qty 2
  Filled 2019-09-27: qty 1
  Filled 2019-09-27 (×2): qty 2
  Filled 2019-09-27: qty 1
  Filled 2019-09-27: qty 2

## 2019-09-27 MED ORDER — VITAMIN D (ERGOCALCIFEROL) 1.25 MG (50000 UNIT) PO CAPS: 50000.0000 [IU] | ORAL_CAPSULE | ORAL | Status: DC

## 2019-09-27 MED ORDER — VITAMIN D3 25 MCG PO TABS: 2000.0000 [IU] | ORAL_TABLET | Freq: Two times a day (BID) | ORAL | Status: DC

## 2019-09-27 MED ORDER — ATENOLOL 25 MG PO TABS
12.5000 mg | ORAL_TABLET | Freq: Two times a day (BID) | ORAL | Status: DC
  Administered 2019-09-27 – 2019-10-06 (×18): 12.5 mg via ORAL
  Filled 2019-09-27 (×18): qty 1

## 2019-09-27 MED ORDER — HYDRALAZINE HCL 25 MG PO TABS
12.5000 mg | ORAL_TABLET | Freq: Two times a day (BID) | ORAL | Status: DC
  Administered 2019-09-28 – 2019-09-29 (×3): 12.5 mg via ORAL
  Filled 2019-09-27 (×3): qty 1

## 2019-09-27 MED ORDER — ENSURE ENLIVE PO LIQD
237.0000 mL | Freq: Every day | ORAL | Status: DC
  Administered 2019-09-28 – 2019-10-10 (×8): 237 mL via ORAL

## 2019-09-27 MED ORDER — SENNOSIDES-DOCUSATE SODIUM 8.6-50 MG PO TABS
2.0000 | ORAL_TABLET | Freq: Every day | ORAL | Status: DC
  Administered 2019-09-27 – 2019-10-08 (×8): 2 via ORAL
  Filled 2019-09-27 (×15): qty 2

## 2019-09-27 MED ORDER — ASCORBIC ACID 500 MG PO TABS: 500.0000 mg | ORAL_TABLET | Freq: Every day | ORAL | Status: DC

## 2019-09-27 MED ORDER — HYDRALAZINE HCL 25 MG PO TABS
12.5000 mg | ORAL_TABLET | Freq: Two times a day (BID) | ORAL | Status: DC
  Filled 2019-09-27: qty 1

## 2019-09-27 MED ORDER — ACETAMINOPHEN 325 MG PO TABS
325.0000 mg | ORAL_TABLET | Freq: Four times a day (QID) | ORAL | Status: DC | PRN
  Administered 2019-09-28 – 2019-10-10 (×5): 650 mg via ORAL
  Filled 2019-09-27 (×6): qty 2

## 2019-09-27 MED ORDER — VITAMIN C 500 MG PO TABS
500.0000 mg | ORAL_TABLET | Freq: Every day | ORAL | Status: DC
  Administered 2019-09-28 – 2019-10-12 (×15): 500 mg via ORAL
  Filled 2019-09-27 (×15): qty 1

## 2019-09-27 MED ORDER — VITAMIN D 25 MCG (1000 UNIT) PO TABS
2000.0000 [IU] | ORAL_TABLET | Freq: Two times a day (BID) | ORAL | Status: DC
  Administered 2019-09-27 – 2019-10-12 (×30): 2000 [IU] via ORAL
  Filled 2019-09-27 (×31): qty 2

## 2019-09-27 MED ORDER — DOCUSATE SODIUM 100 MG PO CAPS: 100.0000 mg | ORAL_CAPSULE | Freq: Two times a day (BID) | ORAL | 0 refills | Status: DC

## 2019-09-27 MED ORDER — FLECAINIDE ACETATE 50 MG PO TABS
100.0000 mg | ORAL_TABLET | Freq: Two times a day (BID) | ORAL | Status: DC
  Administered 2019-09-27 – 2019-10-12 (×30): 100 mg via ORAL
  Filled 2019-09-27 (×30): qty 2

## 2019-09-27 MED ORDER — ATORVASTATIN CALCIUM 10 MG PO TABS
10.0000 mg | ORAL_TABLET | Freq: Every day | ORAL | Status: DC
  Administered 2019-09-27 – 2019-10-11 (×15): 10 mg via ORAL
  Filled 2019-09-27 (×15): qty 1

## 2019-09-27 MED ORDER — MEGESTROL ACETATE 40 MG PO TABS
80.0000 mg | ORAL_TABLET | Freq: Every day | ORAL | Status: DC
  Administered 2019-09-28 – 2019-09-30 (×3): 80 mg via ORAL
  Filled 2019-09-27 (×3): qty 2

## 2019-09-27 MED ORDER — HYDRALAZINE HCL 25 MG PO TABS: 12.5000 mg | ORAL_TABLET | Freq: Two times a day (BID) | ORAL | Status: DC

## 2019-09-27 MED ORDER — ONDANSETRON HCL 4 MG/2ML IJ SOLN: 4.0000 mg | Freq: Four times a day (QID) | INTRAMUSCULAR | Status: DC | PRN

## 2019-09-27 MED ORDER — VITAMIN D (ERGOCALCIFEROL) 1.25 MG (50000 UNIT) PO CAPS
50000.0000 [IU] | ORAL_CAPSULE | ORAL | Status: DC
  Administered 2019-10-02 – 2019-10-09 (×2): 50000 [IU] via ORAL
  Filled 2019-09-27 (×2): qty 1

## 2019-09-27 MED ORDER — APIXABAN 2.5 MG PO TABS: 2.5000 mg | ORAL_TABLET | Freq: Two times a day (BID) | ORAL | Status: DC

## 2019-09-27 NOTE — Progress Notes (Signed)
Physical Therapy Treatment Patient Details Name: Christina Morgan MRN: [id number] DOB: [birth year] Today's Date: 09/27/2019    History of Present Illness 85 y/o female s/p fall with R hip fracture and numerous chronic medical conditions including CKD and PAF on chronic anticoagulation. IM nail right LE. PMh: chronic congestive heart failure, CKD stage III, lymphedema, pretension, hyperlipidemia and PAF.     PT Comments    Pt admitted with above diagnosis. Pt did better today with pain medicine before session.  Did ask nurse to contact MD and ask for difference pain meds or to add a muscle relaxer as this PT feels that pt could have better pain control.  Pt was able to stand to RW today but could not take pivotal steps as she was hurting in right leg too much to unweight the left for a step.  Obtained Stedy and had pt work on standing tolerance and weight shift with pt able to stand longer period of time.  STill recommend rehab for pt.  Pt currently with functional limitations due to the deficits listed below (see PT Problem List). Pt will benefit from skilled PT to increase their independence and safety with mobility to allow discharge to the venue listed below.     Follow Up Recommendations  CIR     Equipment Recommendations  None recommended by PT    Recommendations for Other Services Rehab consult     Precautions / Restrictions Precautions Precautions: Fall Restrictions Weight Bearing Restrictions: Yes RLE Weight Bearing: Weight bearing as tolerated    Mobility  Bed Mobility Overal bed mobility: Needs Assistance Bed Mobility: Supine to Sit;Sit to Supine     Supine to sit: Max assist;Mod assist;+2 for physical assistance     General bed mobility comments: Needed assist with right LE and elevation of trunk.   Transfers Overall transfer level: Needs assistance Equipment used: Rolling walker (2 wheeled) Transfers: Sit to/from [facility] Sit to Stand: Mod  assist;From elevated surface;+2 physical assistance Stand pivot transfers: Total assist;+2 safety/equipment       General transfer comment: Pt was able to stand to RW with mod assist to power up.  once up, pt tried but could not take pivotal steps.  Can shift weight but could not unweight left LE enough to take a step due to right LE pain in standing.  Had pt sit down and then sit to stand to [place] with pt doing well wiht power up and then worked on standing tolerance up to 2 min and shifting weight to lift each LE off of platform. Pt then stood again 1 minute after she rested. then pt sat in recliner  Ambulation/Gait                 Stairs             Wheelchair Mobility    Modified Rankin (Stroke Patients Only)       Balance Overall balance assessment: Needs assistance Sitting-balance support: No upper extremity supported;Feet supported Sitting balance-Leahy Scale: Fair     Standing balance support: Bilateral upper extremity supported;During functional activity Standing balance-Leahy Scale: Poor Standing balance comment: Pt needed bil UE support for standing.                             Cognition Arousal/Alertness: Awake/alert Behavior During Therapy: WFL for tasks assessed/performed Overall Cognitive Status: Within Functional Limits for tasks assessed  Exercises Total Joint Exercises Ankle Circles/Pumps: AROM;Both;10 reps;Supine Quad Sets: AROM;Both;10 reps;Seated Gluteal Sets: AROM;Both;10 reps;Supine Heel Slides: AAROM;Right;5 reps;Supine Hip ABduction/ADduction: AAROM;Right;5 reps;Supine Straight Leg Raises: AAROM;Both;10 reps;Supine Long Arc Quad: AROM;Both;Seated;5 reps    General Comments        Pertinent Vitals/Pain Pain Assessment: Faces Faces Pain Scale: Hurts even more Pain Location: right hip with movement Pain Descriptors / Indicators:  Aching;Discomfort;Grimacing;Guarding Pain Intervention(s): Limited activity within patient's tolerance;Monitored during session;Premedicated before session;Repositioned    Home Living                      Prior Function            PT Goals (current goals can now be found in the care plan section) Acute Rehab PT Goals Patient Stated Goal: to go home Progress towards PT goals: Progressing toward goals    Frequency    Min 4X/week      PT Plan Current plan remains appropriate    Co-evaluation              AM-PAC PT "6 Clicks" Mobility   Outcome Measure  Help needed turning from your back to your side while in a flat bed without using bedrails?: A Lot Help needed moving from lying on your back to sitting on the side of a flat bed without using bedrails?: A Lot Help needed moving to and from a bed to a chair (including a wheelchair)?: A Lot Help needed standing up from a chair using your arms (e.g., wheelchair or bedside chair)?: A Lot Help needed to walk in hospital room?: Total Help needed climbing 3-5 steps with a railing? : Total 6 Click Score: 10    End of Session Equipment Utilized During Treatment: Gait belt Activity Tolerance: Patient limited by fatigue;Patient limited by pain Patient left: in chair;with call bell/phone within reach;with chair alarm set Nurse Communication: Mobility status;Need for lift equipment(stedy) PT Visit Diagnosis: Unsteadiness on feet (R26.81);Muscle weakness (generalized) (M62.81);Pain Pain - Right/Left: Right Pain - part of body: Leg     Time: 1203-1238 PT Time Calculation (min) (ACUTE ONLY): 35 min  Charges:  $Therapeutic Exercise: 8-22 mins $Therapeutic Activity: 8-22 mins                     [place] Pager:  [phone]  Office:  [place] 09/27/2019, 1:07 PM

## 2019-09-27 NOTE — PMR Pre-admission (Signed)
PMR Admission Coordinator Pre-Admission Assessment  Patient: Christina Morgan is an 85 y.o., female MRN: [id number] DOB: [birth year] Height: 5' 6"  (167.6 cm) Weight: 80.9 kg  Insurance Information HMO:     PPO:      PCP:      IPA:      80/20:      OTHER:  PRIMARY: Medicare a and b      Policy#: [id number]      Subscriber: pt Benefits:  Phone #: passport one online     Name: 10/16 Eff. Date: 03/12/2000     Deduct: $2408      Out of Pocket Max: none      Life Max: none CIR: 100%      SNF: 20 full days Outpatient: 80%     Co-Pay: 20% Home Health: 100%      Co-Pay: none DME: 80%     Co-Pay: 20% Providers: pt choice  SECONDARY: [employer] of [place]      Policy#: [id number]      Subscriber: pt  Medicaid Application Date:       Case Manager:  Disability Application Date:       Case Worker:   The "Data Collection Information Summary" for patients in Inpatient Rehabilitation Facilities with attached "Privacy Act [place] Records" was provided and verbally reviewed with: Patient and Family  Emergency Contact Information Contact Information    Name Relation Home Work [place] Daughter   [phone]   Wignall,William "Kandace Parkins   [phone]      Current Medical History  Patient Admitting Diagnosis: right femur fracture  History of Present Illness: 84 year old right-handed female with history of chronic diastolic congestive heart failure, endometrial cancer followed by Dr. Polly Cobia in [place] maintained on Megace as well as Nolvadex, chronic back pain with kyphoplasty May 2020, CKD stage III, lymphedema, hypertension, PAF maintained on Eliquis as well as Tambocor.  Presented 09/23/2019 after reported fall without loss of consciousness.  Denies any chest pain or shortness of breath related to fall.  Noted BUN 21 creatinine 1.49 hemoglobin 11.4, COVID negative.  Echocardiogram 09/24/2019 showed ejection fraction of 70%.  X-rays and imaging revealed right  subtrochanteric hip fracture.  Underwent IM nailing with ORIF 09/24/2019 per Dr. Marcelino Scot.  Hospital course pain management weightbearing as tolerated right lower extremity.  Chronic Eliquis has been resumed postoperatively.  Acute blood loss anemia 7.4 and transfused 2 units packed red blood cells latest hemoglobin 10.9.      Patient's medical record from [facility]  has been reviewed by the rehabilitation admission coordinator and physician.  Past Medical History  Past Medical History:  Diagnosis Date  . Cancer [facility])    endometrial ca  . Chronic diastolic CHF (congestive heart failure) ([place])   . Chronic edema   . CKD (chronic kidney disease), stage III   . Dyslipidemia   . Fracture of left humerus   . History of cardiovascular stress test    Lexiscan Myoview (06/2014): No ischemia or scar, not gated, low risk  . Hx of echocardiogram    Echo (05/02/14): EF 60% to 65%. No regional wall motion abnormalities. Mild AI, mildly dilated aortic root (37 mm), trivial MR, trivial TR  . Hyperlipidemia   . Hypertension   . Lipoma of skin    multiple  . Osteopenia   . PAF (paroxysmal atrial fibrillation) ([place])    failed DCCV 05/2014 >> Flecainide started >> DCCV 7/15 sucessful;  f/u ETT neg for pro-arrhythmia >>  recurrent AF/AFL >> Flecainide inc to 100 bid with repeat DCCV 9/15  . Vitamin D deficiency 09/25/2019    Family History   family history includes Cancer (age of onset: 22) in her father; Coronary artery disease in her mother; Heart attack in her mother.  Prior Rehab/Hospitalizations Has the patient had prior rehab or hospitalizations prior to admission? Yes  Has the patient had major surgery during 100 days prior to admission? Yes   Current Medications  Current Facility-Administered Medications:  .  0.9 %  sodium chloride infusion (Manually program via Guardrails IV Fluids), , Intravenous, Once, Ainsley Spinner, PA-C .  acetaminophen (TYLENOL) tablet 325-650 mg, 325-650 mg, Oral,  Q6H PRN, Ainsley Spinner, PA-C .  acetaminophen (TYLENOL) tablet 500 mg, 500 mg, Oral, Q8H, Ainsley Spinner, PA-C, 500 mg at 09/27/19 0517 .  apixaban (ELIQUIS) tablet 2.5 mg, 2.5 mg, Oral, BID, Ainsley Spinner, PA-C, 2.5 mg at 09/27/19 1007 .  atenolol (TENORMIN) tablet 12.5 mg, 12.5 mg, Oral, BID, Georgette Shell, MD, 12.5 mg at 09/27/19 1007 .  atorvastatin (LIPITOR) tablet 10 mg, 10 mg, Oral, QHS, Ainsley Spinner, PA-C, 10 mg at 09/26/19 2128 .  cholecalciferol (VITAMIN D3) tablet 2,000 Units, 2,000 Units, Oral, BID, Ainsley Spinner, PA-C, 2,000 Units at 09/27/19 1007 .  docusate sodium (COLACE) capsule 100 mg, 100 mg, Oral, BID, Ainsley Spinner, PA-C, 100 mg at 09/27/19 1007 .  feeding supplement (ENSURE ENLIVE) (ENSURE ENLIVE) liquid 237 mL, 237 mL, Oral, Q1500, Georgette Shell, MD, 237 mL at 09/26/19 1431 .  flecainide (TAMBOCOR) tablet 100 mg, 100 mg, Oral, BID, Ainsley Spinner, PA-C, 100 mg at 09/27/19 1007 .  hydrALAZINE (APRESOLINE) tablet 12.5 mg, 12.5 mg, Oral, BID, Troy Sine, MD .  HYDROcodone-acetaminophen (NORCO/VICODIN) 5-325 MG per tablet 1-2 tablet, 1-2 tablet, Oral, Q6H PRN, Ainsley Spinner, PA-C, 1 tablet at 09/27/19 1109 .  menthol-cetylpyridinium (CEPACOL) lozenge 3 mg, 1 lozenge, Oral, PRN **OR** phenol (CHLORASEPTIC) mouth spray 1 spray, 1 spray, Mouth/Throat, PRN, Ainsley Spinner, PA-C .  metoCLOPramide (REGLAN) tablet 5-10 mg, 5-10 mg, Oral, Q8H PRN **OR** metoCLOPramide (REGLAN) injection 5-10 mg, 5-10 mg, Intravenous, Q8H PRN, Ainsley Spinner, PA-C .  morphine 2 MG/ML injection 2 mg, 2 mg, Intravenous, Q3H PRN, Ainsley Spinner, PA-C, 2 mg at 09/25/19 6010 .  mupirocin ointment (BACTROBAN) 2 % 1 application, 1 application, Nasal, BID, Ainsley Spinner, PA-C, 1 application at 93/23/55 1110 .  ondansetron (ZOFRAN) tablet 4 mg, 4 mg, Oral, Q6H PRN **OR** ondansetron (ZOFRAN) injection 4 mg, 4 mg, Intravenous, Q6H PRN, Ainsley Spinner, PA-C .  polyethylene glycol (MIRALAX / GLYCOLAX) packet 17 g, 17 g, Oral,  Daily PRN, Ainsley Spinner, PA-C .  senna-docusate (Senokot-S) tablet 2 tablet, 2 tablet, Oral, QHS, Georgette Shell, MD, 2 tablet at 09/26/19 2128 .  vitamin C (ASCORBIC ACID) tablet 500 mg, 500 mg, Oral, Daily, Ainsley Spinner, PA-C, 500 mg at 09/27/19 1007 .  Vitamin D (Ergocalciferol) (DRISDOL) capsule 50,000 Units, 50,000 Units, Oral, Q7 days, Ainsley Spinner, PA-C, 50,000 Units at 09/25/19 1502  Patients Current Diet:  Diet Order            Diet Heart Room service appropriate? Yes; Fluid consistency: Thin  Diet effective now              Precautions / Restrictions Precautions Precautions: Fall Restrictions Weight Bearing Restrictions: Yes RLE Weight Bearing: Weight bearing as tolerated   Has the patient had 2 or more falls or a fall with injury in the past  year? Yes  Prior Activity Level Limited Community (1-2x/wk): Mod I with RW; uses lift chair; sedentary  Prior Functional Level Self Care: Did the patient need help bathing, dressing, using the toilet or eating? Needed some help  Indoor Mobility: Did the patient need assistance with walking from room to room (with or without device)? Independent  Stairs: Did the patient need assistance with internal or external stairs (with or without device)? Needed some help  Functional Cognition: Did the patient need help planning regular tasks such as shopping or remembering to take medications? Needed some help  Home Assistive Devices / [place] Devices/Equipment: Environmental consultant (specify type), Wheelchair, Eyeglasses, Shower chair with back Home Equipment: Shower seat, Engineer, civil (consulting), Environmental consultant - 2 wheels, Transport chair, Adaptive equipment  Prior Device Use: Indicate devices/aids used by the patient prior to current illness, exacerbation or injury? Walker  Current Functional Level Cognition  Overall Cognitive Status: Within Functional Limits for tasks assessed Orientation Level: Oriented X4    Extremity  Assessment (includes Sensation/Coordination)  Upper Extremity Assessment: Generalized weakness  Lower Extremity Assessment: Defer to PT evaluation RLE: Unable to fully assess due to pain    ADLs  Overall ADL's : Needs assistance/impaired Eating/Feeding: Independent, Sitting Grooming: Wash/dry hands, Wash/dry face, Min guard, Sitting Upper Body Bathing: Sitting, Minimal assistance Lower Body Bathing: Maximal assistance Upper Body Dressing : Minimal assistance, Sitting Lower Body Dressing: Total assistance Toilet Transfer Details (indicate cue type and reason): unable. will need Stedy or +2 assist Toileting- Clothing Manipulation and Hygiene: Total assistance, Bed level Functional mobility during ADLs: Total assistance, Moderate assistance, +2 for physical assistance, +2 for safety/equipment(unable with OT, however mod A +2 - total A with Stedy with PT)    Mobility  Overal bed mobility: Needs Assistance Bed Mobility: Supine to Sit, Sit to Supine Supine to sit: Max assist, Mod assist, +2 for physical assistance Sit to supine: Max assist General bed mobility comments: Needed assist with right LE and elevation of trunk.     Transfers  Overall transfer level: Needs assistance Equipment used: Rolling walker (2 wheeled) Transfer via Lift Equipment: Stedy Transfers: Sit to/from Stand, [employer] Transfers Sit to Stand: Mod assist, From elevated surface, +2 physical assistance Stand pivot transfers: Total assist, +2 safety/equipment General transfer comment: Pt was able to stand to RW with mod assist to power up.  once up, pt tried but could not take pivotal steps.  Can shift weight but could not unweight left LE enough to take a step due to right LE pain in standing.  Had pt sit down and then sit to stand to [place] with pt doing well wiht power up and then worked on standing tolerance up to 2 min and shifting weight to lift each LE off of platform. Pt then stood again 1 minute after she  rested. then pt sat in recliner    Ambulation / Gait / Stairs / Office manager / Balance Balance Overall balance assessment: Needs assistance Sitting-balance support: No upper extremity supported, Feet supported Sitting balance-Leahy Scale: Fair Standing balance support: Bilateral upper extremity supported, During functional activity Standing balance-Leahy Scale: Poor Standing balance comment: Pt needed bil UE support for standing.     Special needs/care consideration BiPAP/CPAP n/a CPM  N/a Continuous Drip IV  N/a Dialysis n/a Life Vest  N/a Oxygen  N/a Special Bed  N/a Trach Size  Na/ Wound Vac n/a Skin ecchymosis buttocks, BUE ecchymosis Bowel mgmt: LBM 10/12 continent Bladder  mgmt: external catheter Diabetic mgmt: n/a Behavioral consideration  N/a Chemo/radiation yes Designated visitor is daughter, Aurilla   Previous Home Environment  Living Arrangements: Alone  Lives With: Alone Available Help at Discharge: (family and hired caregivers to be arranged at d/c) Type of Home: [place]: One level Home Access: Stairs to enter Entrance Stairs-Rails: Right, Left, Can reach both Entrance Stairs-Number of Steps: 4 Bathroom Shower/Tub: Optometrist: Yes How Accessible: Accessible via walker [place]: Yes Type of [place]: Homehealth aide, Meals on wheels [place] (if known): Pt can't remember the name  Discharge Living Setting Plans for Discharge Living Setting: Patient's home, Alone Type of Home at Discharge: House Discharge Home Layout: One level Discharge Home Access: Stairs to enter Entrance Stairs-Rails: Right, Left, Can reach both Entrance Stairs-Number of Steps: 4 Discharge Bathroom Shower/Tub: Tub/shower unit Discharge Bathroom Toilet: Standard Discharge Bathroom Accessibility: Yes How Accessible: Accessible via walker Does the patient have any  problems obtaining your medications?: No  Social/Family/Support Systems Patient Roles: Parent Contact Information: son and daughter Anticipated Caregiver: family and hired caregivers Anticipated Ambulance person Information: see above Ability/Limitations of Caregiver: son and daughter pta coming late afternoon 4 to 5 hrs per day and bring dinner Caregiver Availability: 24/7(hired caregivers to be arranged by family) Discharge Plan Discussed with Primary Caregiver: Yes Is Caregiver In Agreement with Plan?: Yes Does Caregiver/Family have Issues with Lodging/Transportation while Pt is in Rehab?: No  Goals/Additional Needs Patient/Family Goal for Rehab: superivsion PT, superivsion min OT Expected length of stay: [facility] 10 to 14 days Pt/Family Agrees to Admission and willing to participate: Yes Program Orientation Provided & Reviewed with Pt/Caregiver Including Roles  & Responsibilities: Yes  Decrease burden of Care through IP rehab admission: n/a  Possible need for SNF placement upon discharge: pt and family preferred CIR and home with hired caregivers rather than SNF admit  Patient Condition: I have reviewed medical records from [facility] , spoken with  patient, son and daughter. I met with patient at the bedside for inpatient rehabilitation assessment.  Patient will benefit from ongoing PT and OT, can actively participate in 3 hours of therapy a day 5 days of the week, and can make measurable gains during the admission.  Patient will also benefit from the coordinated team approach during an Inpatient Acute Rehabilitation admission.  The patient will receive intensive therapy as well as Rehabilitation physician, nursing, social worker, and care management interventions.  Due to bladder management, bowel management, safety, skin/wound care, disease management, medication administration, pain management and patient education the patient requires 24 hour a day rehabilitation nursing.  The  patient is currently mod to max transfers with mobility and basic ADLs.  Discharge setting and therapy post discharge at home with home health is anticipated.  Patient has agreed to participate in the Acute Inpatient Rehabilitation Program and will admit today.  Preadmission Screen Completed By:  Cleatrice Burke, 09/27/2019 2:36 PM ______________________________________________________________________   Discussed status with Dr. Naaman Plummer  on  09/27/2019 at 1445 and received approval for admission today.  Admission Coordinator:  Cleatrice Burke, RN, time  1308 Date  09/27/2019   Assessment/Plan: Diagnosis: right intertrochanteric hip fx 1. Does the need for close, 24 hr/day Medical supervision in concert with the patient's rehab needs make it unreasonable for this patient to be served in a less intensive setting? Yes 2. Co-Morbidities requiring supervision/potential complications: dCHF, endometrial cancer, PAF, hypertension 3. Due to bladder  management, bowel management, safety, skin/wound care, disease management, medication administration, pain management and patient education, does the patient require 24 hr/day rehab nursing? Yes 4. Does the patient require coordinated care of a physician, rehab nurse, PT, OT, and SLP to address physical and functional deficits in the context of the above medical diagnosis(es)? Yes Addressing deficits in the following areas: balance, endurance, locomotion, strength, transferring, bowel/bladder control, bathing, dressing, feeding, grooming, toileting and psychosocial support 5. Can the patient actively participate in an intensive therapy program of at least 3 hrs of therapy 5 days a week? Yes 6. The potential for patient to make measurable gains while on inpatient rehab is excellent 7. Anticipated functional outcomes upon discharge from inpatient rehab: supervision PT, supervision and min assist OT, n/a SLP 8. Estimated rehab length of stay to reach  the above functional goals is: 10-14 days 9. Anticipated discharge destination: Home 10. Overall Rehab/Functional Prognosis: excellent   MD Signature: Meredith Staggers, MD, [facility] 09/27/2019

## 2019-09-27 NOTE — Progress Notes (Signed)
Meredith Staggers, MD  Physician  Physical Medicine and Rehabilitation  PMR Pre-admission  Signed  Date of Service:  09/27/2019 2:36 PM      Related encounter: ED to Hosp-Admission (Current) from 09/23/2019 in [place]         Show:Clear all [x] Manual[x] Template[x] Copied  Added by: [x] Cristina Gong, RN[x] Meredith Staggers, MD  [] Hover for details PMR Admission Coordinator Pre-Admission Assessment  Patient: Christina Morgan is an 85 y.o., female MRN: [id number] DOB: 1933/11/28 Height: 5' 6"  (167.6 cm) Weight: 80.9 kg  Insurance Information HMO:     PPO:      PCP:      IPA:      80/20:      OTHER:  PRIMARY: Medicare a and b      Policy#: [id number]      Subscriber: pt Benefits:  Phone #: passport one online     Name: 10/16 Eff. Date: 03/12/2000     Deduct: $2408      Out of Pocket Max: none      Life Max: none CIR: 100%      SNF: 20 full days Outpatient: 80%     Co-Pay: 20% Home Health: 100%      Co-Pay: none DME: 80%     Co-Pay: 20% Providers: pt choice  SECONDARY: [employer] [place]      Policy#: [id number]      Subscriber: pt  Medicaid Application Date:       Case Manager:  Disability Application Date:       Case Worker:   The "Data Collection Information Summary" for patients in Inpatient Rehabilitation Facilities with attached "Privacy Act [place] Records" was provided and verbally reviewed with: Patient and Family  Emergency Contact Information         Contact Information    Name Relation Home Work [place] Daughter   [phone]   Habel,William "Kandace Parkins   [phone]      Current Medical History  Patient Admitting Diagnosis: right femur fracture  History of Present Illness: 85 year old right-handed female with history of chronic diastolic congestive heart failure,endometrial cancer followed by Dr. Polly Cobia in [place] maintained on  Megace as well as Nolvadex,chronic back pain with kyphoplasty May 2020, CKD stage III, lymphedema, hypertension, PAF maintained on Eliquis as well as Tambocor. Presented 09/23/2019 after reported fall without loss of consciousness. Denies any chest pain or shortness of breath related to fall. Noted BUN 21 creatinine 1.49 hemoglobin 11.4, COVID negative. Echocardiogram 09/24/2019 showed ejection fraction of 70%. X-rays and imaging revealed right subtrochanteric hip fracture. Underwent IM nailing with ORIF 09/24/2019 per Dr. Marcelino Scot. Hospital course pain management weightbearing as tolerated right lower extremity. Chronic Eliquis has been resumed postoperatively. Acute blood loss anemia 7.4 and transfused 2 units packed red blood cells latest hemoglobin 10.9.   Patient's medical record from [facility]  has been reviewed by the rehabilitation admission coordinator and physician.  Past Medical History      Past Medical History:  Diagnosis Date  . Cancer [facility])    endometrial ca  . Chronic diastolic CHF (congestive heart failure) ([place])   . Chronic edema   . CKD (chronic kidney disease), stage III   . Dyslipidemia   . Fracture of left humerus   . History of cardiovascular stress test    Lexiscan Myoview (06/2014): No ischemia or scar, not gated, low risk  . Hx of echocardiogram  Echo (05/02/14): EF 60% to 65%. No regional wall motion abnormalities. Mild AI, mildly dilated aortic root (37 mm), trivial MR, trivial TR  . Hyperlipidemia   . Hypertension   . Lipoma of skin    multiple  . Osteopenia   . PAF (paroxysmal atrial fibrillation) ([place])    failed DCCV 05/2014 >> Flecainide started >> DCCV 7/15 sucessful;  f/u ETT neg for pro-arrhythmia >> recurrent AF/AFL >> Flecainide inc to 100 bid with repeat DCCV 9/15  . Vitamin D deficiency 09/25/2019    Family History   family history includes Cancer (age of onset: 54) in her father; Coronary artery disease in  her mother; Heart attack in her mother.  Prior Rehab/Hospitalizations Has the patient had prior rehab or hospitalizations prior to admission? Yes  Has the patient had major surgery during 100 days prior to admission? Yes             Current Medications  Current Facility-Administered Medications:  .  0.9 %  sodium chloride infusion (Manually program via Guardrails IV Fluids), , Intravenous, Once, Ainsley Spinner, PA-C .  acetaminophen (TYLENOL) tablet 325-650 mg, 325-650 mg, Oral, Q6H PRN, Ainsley Spinner, PA-C .  acetaminophen (TYLENOL) tablet 500 mg, 500 mg, Oral, Q8H, Ainsley Spinner, PA-C, 500 mg at 09/27/19 0517 .  apixaban (ELIQUIS) tablet 2.5 mg, 2.5 mg, Oral, BID, Ainsley Spinner, PA-C, 2.5 mg at 09/27/19 1007 .  atenolol (TENORMIN) tablet 12.5 mg, 12.5 mg, Oral, BID, Georgette Shell, MD, 12.5 mg at 09/27/19 1007 .  atorvastatin (LIPITOR) tablet 10 mg, 10 mg, Oral, QHS, Ainsley Spinner, PA-C, 10 mg at 09/26/19 2128 .  cholecalciferol (VITAMIN D3) tablet 2,000 Units, 2,000 Units, Oral, BID, Ainsley Spinner, PA-C, 2,000 Units at 09/27/19 1007 .  docusate sodium (COLACE) capsule 100 mg, 100 mg, Oral, BID, Ainsley Spinner, PA-C, 100 mg at 09/27/19 1007 .  feeding supplement (ENSURE ENLIVE) (ENSURE ENLIVE) liquid 237 mL, 237 mL, Oral, Q1500, Georgette Shell, MD, 237 mL at 09/26/19 1431 .  flecainide (TAMBOCOR) tablet 100 mg, 100 mg, Oral, BID, Ainsley Spinner, PA-C, 100 mg at 09/27/19 1007 .  hydrALAZINE (APRESOLINE) tablet 12.5 mg, 12.5 mg, Oral, BID, Troy Sine, MD .  HYDROcodone-acetaminophen (NORCO/VICODIN) 5-325 MG per tablet 1-2 tablet, 1-2 tablet, Oral, Q6H PRN, Ainsley Spinner, PA-C, 1 tablet at 09/27/19 1109 .  menthol-cetylpyridinium (CEPACOL) lozenge 3 mg, 1 lozenge, Oral, PRN **OR** phenol (CHLORASEPTIC) mouth spray 1 spray, 1 spray, Mouth/Throat, PRN, Ainsley Spinner, PA-C .  metoCLOPramide (REGLAN) tablet 5-10 mg, 5-10 mg, Oral, Q8H PRN **OR** metoCLOPramide (REGLAN) injection 5-10 mg, 5-10 mg,  Intravenous, Q8H PRN, Ainsley Spinner, PA-C .  morphine 2 MG/ML injection 2 mg, 2 mg, Intravenous, Q3H PRN, Ainsley Spinner, PA-C, 2 mg at 09/25/19 1610 .  mupirocin ointment (BACTROBAN) 2 % 1 application, 1 application, Nasal, BID, Ainsley Spinner, PA-C, 1 application at 96/04/54 1110 .  ondansetron (ZOFRAN) tablet 4 mg, 4 mg, Oral, Q6H PRN **OR** ondansetron (ZOFRAN) injection 4 mg, 4 mg, Intravenous, Q6H PRN, Ainsley Spinner, PA-C .  polyethylene glycol (MIRALAX / GLYCOLAX) packet 17 g, 17 g, Oral, Daily PRN, Ainsley Spinner, PA-C .  senna-docusate (Senokot-S) tablet 2 tablet, 2 tablet, Oral, QHS, Georgette Shell, MD, 2 tablet at 09/26/19 2128 .  vitamin C (ASCORBIC ACID) tablet 500 mg, 500 mg, Oral, Daily, Ainsley Spinner, PA-C, 500 mg at 09/27/19 1007 .  Vitamin D (Ergocalciferol) (DRISDOL) capsule 50,000 Units, 50,000 Units, Oral, Q7 days, Ainsley Spinner, PA-C, 50,000 Units at 09/25/19  1502  Patients Current Diet:     Diet Order                  Diet Heart Room service appropriate? Yes; Fluid consistency: Thin  Diet effective now               Precautions / Restrictions Precautions Precautions: Fall Restrictions Weight Bearing Restrictions: Yes RLE Weight Bearing: Weight bearing as tolerated   Has the patient had 2 or more falls or a fall with injury in the past year? Yes  Prior Activity Level Limited Community (1-2x/wk): Mod I with RW; uses lift chair; sedentary  Prior Functional Level Self Care: Did the patient need help bathing, dressing, using the toilet or eating? Needed some help  Indoor Mobility: Did the patient need assistance with walking from room to room (with or without device)? Independent  Stairs: Did the patient need assistance with internal or external stairs (with or without device)? Needed some help  Functional Cognition: Did the patient need help planning regular tasks such as shopping or remembering to take medications? Needed some help  Home Assistive  Devices / [place] Devices/Equipment: Environmental consultant (specify type), Wheelchair, Eyeglasses, Shower chair with back Home Equipment: Shower seat, Engineer, civil (consulting), Environmental consultant - 2 wheels, Transport chair, Adaptive equipment  Prior Device Use: Indicate devices/aids used by the patient prior to current illness, exacerbation or injury? Walker  Current Functional Level Cognition  Overall Cognitive Status: Within Functional Limits for tasks assessed Orientation Level: Oriented X4    Extremity Assessment (includes Sensation/Coordination)  Upper Extremity Assessment: Generalized weakness  Lower Extremity Assessment: Defer to PT evaluation RLE: Unable to fully assess due to pain    ADLs  Overall ADL's : Needs assistance/impaired Eating/Feeding: Independent, Sitting Grooming: Wash/dry hands, Wash/dry face, Min guard, Sitting Upper Body Bathing: Sitting, Minimal assistance Lower Body Bathing: Maximal assistance Upper Body Dressing : Minimal assistance, Sitting Lower Body Dressing: Total assistance Toilet Transfer Details (indicate cue type and reason): unable. will need Stedy or +2 assist Toileting- Clothing Manipulation and Hygiene: Total assistance, Bed level Functional mobility during ADLs: Total assistance, Moderate assistance, +2 for physical assistance, +2 for safety/equipment(unable with OT, however mod A +2 - total A with Stedy with PT)    Mobility  Overal bed mobility: Needs Assistance Bed Mobility: Supine to Sit, Sit to Supine Supine to sit: Max assist, Mod assist, +2 for physical assistance Sit to supine: Max assist General bed mobility comments: Needed assist with right LE and elevation of trunk.     Transfers  Overall transfer level: Needs assistance Equipment used: Rolling walker (2 wheeled) Transfer via Lift Equipment: Stedy Transfers: Sit to/from Stand, [employer] Transfers Sit to Stand: Mod assist, From elevated surface, +2 physical assistance Stand pivot  transfers: Total assist, +2 safety/equipment General transfer comment: Pt was able to stand to RW with mod assist to power up.  once up, pt tried but could not take pivotal steps.  Can shift weight but could not unweight left LE enough to take a step due to right LE pain in standing.  Had pt sit down and then sit to stand to [place] with pt doing well wiht power up and then worked on standing tolerance up to 2 min and shifting weight to lift each LE off of platform. Pt then stood again 1 minute after she rested. then pt sat in recliner    Ambulation / Gait / Stairs / Office manager /  Balance Balance Overall balance assessment: Needs assistance Sitting-balance support: No upper extremity supported, Feet supported Sitting balance-Leahy Scale: Fair Standing balance support: Bilateral upper extremity supported, During functional activity Standing balance-Leahy Scale: Poor Standing balance comment: Pt needed bil UE support for standing.     Special needs/care consideration BiPAP/CPAP n/a CPM  N/a Continuous Drip IV  N/a Dialysis n/a Life Vest  N/a Oxygen  N/a Special Bed  N/a Trach Size  Na/ Wound Vac n/a Skin ecchymosis buttocks, BUE ecchymosis Bowel mgmt: LBM 10/12 continent Bladder mgmt: external catheter Diabetic mgmt: n/a Behavioral consideration  N/a Chemo/radiation yes Designated visitor is daughter, Kirat   Previous Home Environment  Living Arrangements: Alone  Lives With: Alone Available Help at Discharge: (family and hired caregivers to be arranged at d/c) Type of Home: House Home Layout: One level Home Access: Stairs to enter Entrance Stairs-Rails: Right, Left, Can reach both Entrance Stairs-Number of Steps: 4 Bathroom Shower/Tub: Optometrist: Yes How Accessible: Accessible via walker [place]: Yes Type of Home Care Services: Homehealth aide, Meals on wheels [place]  (if known): Pt can't remember the name  Discharge Living Setting Plans for Discharge Living Setting: Patient's home, Alone Type of Home at Discharge: House Discharge Home Layout: One level Discharge Home Access: Stairs to enter Entrance Stairs-Rails: Right, Left, Can reach both Entrance Stairs-Number of Steps: 4 Discharge Bathroom Shower/Tub: Tub/shower unit Discharge Bathroom Toilet: Standard Discharge Bathroom Accessibility: Yes How Accessible: Accessible via walker Does the patient have any problems obtaining your medications?: No  Social/Family/Support Systems Patient Roles: Parent Contact Information: son and daughter Anticipated Caregiver: family and hired caregivers Anticipated Ambulance person Information: see above Ability/Limitations of Caregiver: son and daughter pta coming late afternoon 4 to 5 hrs per day and bring dinner Caregiver Availability: 24/7(hired caregivers to be arranged by family) Discharge Plan Discussed with Primary Caregiver: Yes Is Caregiver In Agreement with Plan?: Yes Does Caregiver/Family have Issues with Lodging/Transportation while Pt is in Rehab?: No  Goals/Additional Needs Patient/Family Goal for Rehab: superivsion PT, superivsion min OT Expected length of stay: ELOS 10 to 14 days Pt/Family Agrees to Admission and willing to participate: Yes Program Orientation Provided & Reviewed with Pt/Caregiver Including Roles  & Responsibilities: Yes  Decrease burden of Care through IP rehab admission: n/a  Possible need for SNF placement upon discharge: pt and family preferred CIR and home with hired caregivers rather than SNF admit  Patient Condition: I have reviewed medical records from [facility] , spoken with  patient, son and daughter. I met with patient at the bedside for inpatient rehabilitation assessment.  Patient will benefit from ongoing PT and OT, can actively participate in 3 hours of therapy a day 5 days of the week, and can  make measurable gains during the admission.  Patient will also benefit from the coordinated team approach during an Inpatient Acute Rehabilitation admission.  The patient will receive intensive therapy as well as Rehabilitation physician, nursing, social worker, and care management interventions.  Due to bladder management, bowel management, safety, skin/wound care, disease management, medication administration, pain management and patient education the patient requires 24 hour a day rehabilitation nursing.  The patient is currently mod to max transfers with mobility and basic ADLs.  Discharge setting and therapy post discharge at home with home health is anticipated.  Patient has agreed to participate in the Acute Inpatient Rehabilitation Program and will admit today.  Preadmission Screen Completed By:  Cleatrice Burke,  09/27/2019 2:36 PM ______________________________________________________________________   Discussed status with Dr. Naaman Plummer  on  09/27/2019 at 1445 and received approval for admission today.  Admission Coordinator:  Cleatrice Burke, RN, time  3383 Date  09/27/2019   Assessment/Plan: Diagnosis: right intertrochanteric hip fx 1. Does the need for close, 24 hr/day Medical supervision in concert with the patient's rehab needs make it unreasonable for this patient to be served in a less intensive setting? Yes 2. Co-Morbidities requiring supervision/potential complications: dCHF, endometrial cancer, PAF, hypertension 3. Due to bladder management, bowel management, safety, skin/wound care, disease management, medication administration, pain management and patient education, does the patient require 24 hr/day rehab nursing? Yes 4. Does the patient require coordinated care of a physician, rehab nurse, PT, OT, and SLP to address physical and functional deficits in the context of the above medical diagnosis(es)? Yes Addressing deficits in the following areas: balance,  endurance, locomotion, strength, transferring, bowel/bladder control, bathing, dressing, feeding, grooming, toileting and psychosocial support 5. Can the patient actively participate in an intensive therapy program of at least 3 hrs of therapy 5 days a week? Yes 6. The potential for patient to make measurable gains while on inpatient rehab is excellent 7. Anticipated functional outcomes upon discharge from inpatient rehab: supervision PT, supervision and min assist OT, n/a SLP 8. Estimated rehab length of stay to reach the above functional goals is: 10-14 days 9. Anticipated discharge destination: Home 10. Overall Rehab/Functional Prognosis: excellent   MD Signature: Meredith Staggers, MD, [place] [facility] 09/27/2019         Revision History

## 2019-09-27 NOTE — Progress Notes (Addendum)
Progress Note  Patient Name: Christina Morgan Date of Encounter: 09/27/2019  Primary Cardiologist: Fransico Him, MD   Subjective   No chest pain or SOB  Just rec'd PRBCs Inpatient Medications    Scheduled Meds:  sodium chloride   Intravenous Once   acetaminophen  500 mg Oral Q8H   apixaban  2.5 mg Oral BID   atenolol  12.5 mg Oral BID   atorvastatin  10 mg Oral QHS   cholecalciferol  2,000 Units Oral BID   docusate sodium  100 mg Oral BID   feeding supplement (ENSURE ENLIVE)  237 mL Oral Q1500   flecainide  100 mg Oral BID   mupirocin ointment  1 application Nasal BID   senna-docusate  2 tablet Oral QHS   vitamin C  500 mg Oral Daily   Vitamin D (Ergocalciferol)  50,000 Units Oral Q7 days   Continuous Infusions:  PRN Meds: acetaminophen, HYDROcodone-acetaminophen, menthol-cetylpyridinium **OR** phenol, metoCLOPramide **OR** metoCLOPramide (REGLAN) injection, morphine injection, ondansetron **OR** ondansetron (ZOFRAN) IV, polyethylene glycol   Vital Signs    Vitals:   09/27/19 0330 09/27/19 0358 09/27/19 0608 09/27/19 0810  BP: (!) 144/58 (!) 113/100 (!) 126/53 (!) 154/95  Pulse: 60 63 (!) 56 60  Resp: 18  18 18   Temp: 98.3 F (36.8 C) 98.4 F (36.9 C) 97.7 F (36.5 C) 97.8 F (36.6 C)  TempSrc: Oral Oral Oral Oral  SpO2: 95%  98% 98%  Weight:      Height:        Intake/Output Summary (Last 24 hours) at 09/27/2019 1004 Last data filed at 09/27/2019 0611 Gross per 24 hour  Intake 1230 ml  Output 1000 ml  Net 230 ml   Last 3 Weights 09/24/2019 07/04/2019 06/21/2019  Weight (lbs) 178 lb 5.6 oz 176 lb 3.2 oz 175 lb  Weight (kg) 80.9 kg 79.924 kg 79.379 kg      Telemetry    SR no a fib - Personally Reviewed  ECG    No new - Personally Reviewed  Physical Exam   GEN: No acute distress.   Neck: No JVD Cardiac: RRR, no murmurs, rubs, or gallops.  Respiratory: Clear to auscultation bilaterally. GI: Soft, nontender, non-distended  MS:  tr to 1+ edema; No deformity. Neuro:  Nonfocal  Psych: Normal affect   Labs    High Sensitivity Troponin:   Recent Labs  Lab 09/23/19 1937 09/24/19 0254  TROPONINIHS 8 9      Chemistry Recent Labs  Lab 09/24/19 0254 09/25/19 0334 09/26/19 0654 09/27/19 0822  NA 142 145 142 139  K 4.6 4.2 3.7 3.9  CL 105 108 108 104  CO2 26 26 26 25   GLUCOSE 118* 145* 107* 106*  BUN 23 25* 32* 31*  CREATININE 1.60* 1.58* 1.61* 1.48*  CALCIUM 8.5* 8.4* 8.1* 8.1*  PROT 5.3* 4.8*  --   --   ALBUMIN 2.8* 2.3*  --   --   AST 21 20  --   --   ALT 13 12  --   --   ALKPHOS 74 61  --   --   BILITOT 1.2 0.3  --   --   GFRNONAA 29* 30* 29* 32*  GFRAA 34* 34* 34* 37*  ANIONGAP 11 11 8 10      Hematology Recent Labs  Lab 09/24/19 0254 09/25/19 0334 09/26/19 0654 09/27/19 0822  WBC 7.6 7.7 7.2  --   RBC 3.22* 2.84* 2.31*  --   HGB 10.5*  9.2* 7.4* 10.9*  HCT 31.8* 28.1* 23.2* 33.5*  MCV 98.8 98.9 100.4*  --   MCH 32.6 32.4 32.0  --   MCHC 33.0 32.7 31.9  --   RDW 13.8 13.7 13.9  --   PLT 197 192 205  --     BNPNo results for input(s): BNP, PROBNP in the last 168 hours.   DDimer No results for input(s): DDIMER in the last 168 hours.   Radiology    No results found.  Cardiac Studies   Echo 09/24/19 IMPRESSIONS    1. Left ventricular ejection fraction, by visual estimation, is 65 to 70%. The left ventricle has hyperdynamic function. Normal left ventricular size. There is mildly increased left ventricular hypertrophy.  2. Elevated mean left atrial pressure.  3. Left ventricular diastolic Doppler parameters are consistent with pseudonormalization pattern of LV diastolic filling.  4. Global right ventricle has normal systolic function.The right ventricular size is normal. No increase in right ventricular wall thickness.  5. Left atrial size was mildly dilated.  6. Right atrial size was normal.  7. The mitral valve is normal in structure. No evidence of mitral valve  regurgitation. No evidence of mitral stenosis.  8. The tricuspid valve is normal in structure. Tricuspid valve regurgitation is trivial.  9. The aortic valve is tricuspid Aortic valve regurgitation is mild by color flow Doppler. Mild aortic valve sclerosis without stenosis. 10. The pulmonic valve was normal in structure. Pulmonic valve regurgitation is mild by color flow Doppler. 11. Moderately elevated pulmonary artery systolic pressure. 12. The inferior vena cava is normal in size with greater than 50% respiratory variability, suggesting right atrial pressure of 3 mmHg.  FINDINGS  Left Ventricle: Left ventricular ejection fraction, by visual estimation, is 65 to 70%. The left ventricle has hyperdynamic function. There is mildly increased left ventricular hypertrophy. Septal left ventricular hypertrophy. Normal left ventricular  size. Spectral Doppler shows Left ventricular diastolic Doppler parameters are consistent with pseudonormalization pattern of LV diastolic filling. Elevated mean left atrial pressure.  Right Ventricle: The right ventricular size is normal. No increase in right ventricular wall thickness. Global RV systolic function is has normal systolic function. The tricuspid regurgitant velocity is 3.11 m/s, and with an assumed right atrial pressure  of 3 mmHg, the estimated right ventricular systolic pressure is moderately elevated at 41.6 mmHg.  Left Atrium: Left atrial size was mildly dilated.  Right Atrium: Right atrial size was normal in size  Pericardium: There is no evidence of pericardial effusion.  Mitral Valve: The mitral valve is normal in structure. No evidence of mitral valve stenosis by observation. MV peak gradient, 6.7 mmHg. No evidence of mitral valve regurgitation.  Tricuspid Valve: The tricuspid valve is normal in structure. Tricuspid valve regurgitation is trivial by color flow Doppler.  Aortic Valve: The aortic valve is tricuspid. Aortic valve  regurgitation is mild by color flow Doppler. Aortic regurgitation PHT measures 446 msec. Mild aortic valve sclerosis is present, with no evidence of aortic valve stenosis. Aortic valve mean  gradient measures 5.0 mmHg. Aortic valve peak gradient measures 10.1 mmHg. Aortic valve area, by VTI measures 2.29 cm.  Pulmonic Valve: The pulmonic valve was normal in structure. Pulmonic valve regurgitation is mild by color flow Doppler.  Aorta: The aortic root, ascending aorta and aortic arch are all structurally normal, with no evidence of dilitation or obstruction.  Venous: The inferior vena cava is normal in size with greater than 50% respiratory variability, suggesting right atrial pressure of 3  mmHg.  IAS/Shunts: No atrial level shunt detected by color flow Doppler. No ventricular septal defect is seen or detected. There is no evidence of an atrial septal defect.   Patient Profile     85 y.o. female with a hx of paroxysmal atrial fibrillation on Eliquis and flecainide, hypertension, hyperlipidemia, CKD stage III, endometrial cancer diastolic CHF and chronic lower extremity edema,  pt with fall that led to admit and X-rays showed severely displaced and commuted intertrochanteric fracture of the proximal right femur.   Assessment & Plan    Chronic diastolic heart failure -Patient has chronic lower extremity edema felt to be multifactorial nature. -Echo this admit shows normal LV systolic function with diastolic dysfunction, mild aortic regurgitation -Patient has stable exertional dyspnea, related to her inactivity. Activity is limited by weakness and shortness of breath. -Lungs clear on chest x-ray. -Torsemide still on hold   Lungs clear.  Lower extremity edema/lymphedema -Felt to be multifactorial from chronic venous insufficiency, sedentary state with her legs being dependent most of the time and dietary indiscretion with sodium along with possible effect of amlodipine and Megace.  Question if her pelvic cancer/treatment with radiation could also be contributing.  Amlodipine has been discontinued with hydralazine use for blood pressure.  She continues on beta-blocker.  Furosemide was switched to torsemide with improvement as outpt and currently on hold. -Pt referred for lymphedema therapy by oncologist as an outpatient.  -Torsemide is currently on hold  -Leg edema is currently  Much better than it has been in a long time, per her daughter, despite no diuretic since admit   Paroxysmal atrial fibrillation -On flecainide and atenolol, maintaining sinus rhythm. -On Eliquis for stroke risk reduction at home.  Eliquis has been resumed  On lower dose of atenolol, may need to increase  Hypertension -At home on atenolol 50 mg twice daily, hydralazine 10 mg twice daily and Demadex 40 mg twice daily. Though torsemide is on hold. And atenolol is only at 12.5 BID  BP 126/53 to 154/95  Either increase or add hydralazine back  CKD stage III -Serum creatinine baseline ~1.2-1.4.  Serum creatinine 1.49 on presentation, 1.60 today and today 1.48.Marland Kitchen   Hyperlipidemia -On atorvastatin 10 mg daily.  Continue statin perioperatively to reduce her risk of cardiac complications.      For questions or updates, please contact [place] Please consult [email or web address] for contact info under      Signed, Cecilie Kicks, NP  09/27/2019, 10:04 AM    Patient seen and examined. Agree with assessment and plan. Feels well. Maintaining sinus rhthym on flecainide and lower dose atenolol. Lymphedema significantly improved; dry skin, no edema presently. Cr slightly improved; today 1.48. Pulse today in the mid 50s to 60s.  Will resume hydralazine at 12.5 mg bid. Monitor BP, HR and edema moving forward.   Troy Sine, MD, [facility] 09/27/2019 12:16 PM

## 2019-09-27 NOTE — Progress Notes (Signed)
Per day nurse, Doroteo Bradford, blood band could not be found. Per pt band was removed by Doroteo Bradford and thrown into trash. Band was found in pt's trash, band # [id number]. Per lab staff, Jana Half, at blood bank there was a second band applied once type & screen was redrawn by Jeanmarie Plant around [phone]. Phlebotomy was called 3 times with no answer. Blood administration was delayed R/T no band requiring new orders for type and screen.

## 2019-09-27 NOTE — H&P (Addendum)
Physical Medicine and Rehabilitation Admission H&P    Chief Complaint  Patient presents with  . Fall  . Hip Injury  : HPI: Christina Morgan is an 85 year old right-handed female with history of chronic diastolic congestive heart failure, endometrial cancer followed by Dr. Polly Cobia in [place] maintained on Megace as well as Nolvadex, chronic back pain with kyphoplasty May 2020, CKD stage III, lymphedema, hypertension, PAF maintained on Eliquis as well as Tambocor.  Per chart review patient lives alone.  1 level home 4 steps to entry.  Modified independent with a walker however reportedly sedentary and has had other falls in the past.  She has an aide 2 times a week to assist with some ADLs.  She does have a daughter in the area who checks on her routinely.  Presented 09/23/2019 after reported fall without loss of consciousness.  Denies any chest pain or shortness of breath related to fall.  Noted BUN 21 creatinine 1.49 hemoglobin 11.4, COVID negative.  Echocardiogram 09/24/2019 showed ejection fraction of 70%.  X-rays and imaging revealed right subtrochanteric hip fracture.  Underwent IM nailing with ORIF 09/24/2019 per Dr. Marcelino Scot.  Hospital course pain management weightbearing as tolerated right lower extremity.  Chronic Eliquis has been resumed postoperatively.  Acute blood loss anemia 7.4 and transfused 2 units packed red blood cells latest hemoglobin 10.9.  Therapy evaluations completed and patient was admitted for a comprehensive rehab program  Review of Systems  Constitutional: Negative for chills and fever.  HENT: Negative for hearing loss.   Eyes: Negative for blurred vision and double vision.  Respiratory: Negative for cough and shortness of breath.   Cardiovascular: Positive for palpitations and leg swelling.  Gastrointestinal: Positive for constipation. Negative for heartburn, nausea and vomiting.  Genitourinary: Negative for dysuria, flank pain and hematuria.   Musculoskeletal: Positive for joint pain and myalgias.  Skin: Negative for rash.  All other systems reviewed and are negative.  Past Medical History:  Diagnosis Date  . Cancer [facility])    endometrial ca  . Chronic diastolic CHF (congestive heart failure) ([place])   . Chronic edema   . CKD (chronic kidney disease), stage III   . Dyslipidemia   . Fracture of left humerus   . History of cardiovascular stress test    Lexiscan Myoview (06/2014): No ischemia or scar, not gated, low risk  . Hx of echocardiogram    Echo (05/02/14): EF 60% to 65%. No regional wall motion abnormalities. Mild AI, mildly dilated aortic root (37 mm), trivial MR, trivial TR  . Hyperlipidemia   . Hypertension   . Lipoma of skin    multiple  . Osteopenia   . PAF (paroxysmal atrial fibrillation) ([place])    failed DCCV 05/2014 >> Flecainide started >> DCCV 7/15 sucessful;  f/u ETT neg for pro-arrhythmia >> recurrent AF/AFL >> Flecainide inc to 100 bid with repeat DCCV 9/15  . Vitamin D deficiency 09/25/2019   Past Surgical History:  Procedure Laterality Date  . CARDIOVERSION N/A 06/05/2014   Procedure: CARDIOVERSION;  Surgeon: Sueanne Margarita, MD;  Location: [place];  Service: Cardiovascular;  Laterality: N/A;  . CARDIOVERSION N/A 07/11/2014   Procedure: CARDIOVERSION;  Surgeon: Sueanne Margarita, MD;  Location: [facility] ENDOSCOPY;  Service: Cardiovascular;  Laterality: N/A;  . CARDIOVERSION N/A 09/04/2014   Procedure: CARDIOVERSION;  Surgeon: Sueanne Margarita, MD;  Location: [facility] ENDOSCOPY;  Service: Cardiovascular;  Laterality: N/A;  . COLONOSCOPY  2008  . COLONOSCOPY N/A 05/20/2015   Procedure: COLONOSCOPY;  Surgeon: Ronald Lobo, MD;  Location: [facility] ENDOSCOPY;  Service: Endoscopy;  Laterality: N/A;  . FEMUR IM NAIL Right 09/24/2019   Procedure: INTRAMEDULLARY (IM) NAIL FEMORAL;  Surgeon: Altamese Rittman, MD;  Location: [facility];  Service: Orthopedics;  Laterality: Right;  . IR KYPHO EA ADDL LEVEL THORACIC OR LUMBAR  04/12/2019  . IR  KYPHO THORACIC WITH BONE BIOPSY  04/12/2019  . ROBOTIC ASSISTED SUPRACERVICAL HYSTERECTOMY WITH BILATERAL SALPINGO OOPHERECTOMY  11/06/2012   and bilateral pelvic lymph node dissection   Family History  Problem Relation Age of Onset  . Cancer Father 76       metastatic oropharyngeal ca  . Heart attack Mother   . Coronary artery disease Mother    Social History:  reports that she has never smoked. She has never used smokeless tobacco. She reports that she does not drink alcohol or use drugs. Allergies:  Allergies  Allergen Reactions  . Penicillins Other (See Comments)    Dizziness Has patient had a PCN reaction causing immediate rash, facial/tongue/throat swelling, SOB or lightheadedness with hypotension: No Has patient had a PCN reaction causing severe rash involving mucus membranes or skin necrosis: No Has patient had a PCN reaction that required hospitalization: No Has patient had a PCN reaction occurring within the last 10 years: No If all of the above answers are "NO", then may proceed with Cephalosporin use.   Medications Prior to Admission  Medication Sig Dispense Refill  . apixaban (ELIQUIS) 5 MG TABS tablet Take 5 mg by mouth 2 (two) times daily.    Marland Kitchen atenolol (TENORMIN) 50 MG tablet TAKE 1 TABLET BY MOUTH TWICE DAILY (Patient taking differently: Take 50 mg by mouth 2 (two) times daily. ) 60 tablet 11  . atorvastatin (LIPITOR) 10 MG tablet Take 10 mg by mouth at bedtime.     . flecainide (TAMBOCOR) 100 MG tablet TAKE 1 TABLET BY MOUTH TWICE DAILY (Patient taking differently: Take 100 mg by mouth 2 (two) times daily. ) 180 tablet 3  . hydrALAZINE (APRESOLINE) 10 MG tablet Take 1 tablet (10 mg total) by mouth 2 (two) times a day. 180 tablet 3  . megestrol (MEGACE) 40 MG tablet Take 80 mg by mouth 2 (two) times daily. Take for 3 weeks , then alternate with tamoxifen 20 mg 1 daily for 3 weeks, etc.    . potassium chloride SA (K-DUR) 20 MEQ tablet Take 1 tablet (20 mEq total) by mouth  2 (two) times daily. 180 tablet 3  . tamoxifen (NOLVADEX) 20 MG tablet Take 20 mg by mouth 2 (two) times daily. Alternate every 3 weeks with megestrol.    . torsemide (DEMADEX) 20 MG tablet Take 2 tablets (40 mg total) by mouth 2 (two) times daily. 360 tablet 3    Drug Regimen Review Drug regimen was reviewed and remains appropriate with no significant issues identified  Home: Home Living Family/patient expects to be discharged to:: Private residence Living Arrangements: Alone Available Help at Discharge: Family, Available PRN/intermittently, Personal care attendant Type of Home: House Home Access: Stairs to enter [employer] of Steps: 4 Entrance Stairs-Rails: Right, Left, Can reach both Home Layout: One level Bathroom Shower/Tub: Chiropodist: Standard Home Equipment: Civil engineer, contracting, Bedside commode, Environmental consultant - 2 wheels, Transport chair, Radiation protection practitioner Equipment: Reacher   Functional History: Prior Function Level of Independence: Needs assistance Gait / Transfers Assistance Needed: Mod I with walker/ uses lift chair all day ADL's / Homemaking Assistance Needed: aide sponge bathing pt 2  x week recently because of leg wraps but used to sit on chair in tub shower  Functional Status:  Mobility: Bed Mobility Overal bed mobility: Needs Assistance Bed Mobility: Supine to Sit, Sit to Supine Supine to sit: Max assist, Mod assist Sit to supine: Max assist General bed mobility comments: Needed more assist for LEs especially assist with right LE and elevation of trunk.  Transfers Overall transfer level: Needs assistance Equipment used: Rolling walker (2 wheeled) Transfer via Lift Equipment: Stedy Transfers: Sit to/from Stand, [employer] Transfers Sit to Stand: Mod assist, From elevated surface, +2 physical assistance Stand pivot transfers: Total assist, +2 safety/equipment General transfer comment: unable due to pain and fatigue, PT used Stedy  in earlier session today      ADL: ADL Overall ADL's : Needs assistance/impaired Eating/Feeding: Independent, Sitting Grooming: Wash/dry hands, Wash/dry face, Min guard, Sitting Upper Body Bathing: Sitting, Minimal assistance Lower Body Bathing: Maximal assistance Upper Body Dressing : Minimal assistance, Sitting Lower Body Dressing: Total assistance Toilet Transfer Details (indicate cue type and reason): unable. will need Stedy or +2 assist Toileting- Clothing Manipulation and Hygiene: Total assistance, Bed level Functional mobility during ADLs: Total assistance, Moderate assistance, +2 for physical assistance, +2 for safety/equipment(unable with OT, however mod A +2 - total A with Stedy with PT)  Cognition: Cognition Overall Cognitive Status: Within Functional Limits for tasks assessed Orientation Level: Oriented X4 Cognition Arousal/Alertness: Awake/alert Behavior During Therapy: WFL for tasks assessed/performed Overall Cognitive Status: Within Functional Limits for tasks assessed  Physical Exam: Blood pressure (!) 154/95, pulse 60, temperature 97.8 F (36.6 C), temperature source Oral, resp. rate 18, height 5\' 6"  (1.676 m), weight 80.9 kg, SpO2 98 %. Physical Exam  Constitutional: She is oriented to person, place, and time. She appears well-developed and well-nourished.  HENT:  Head: Normocephalic and atraumatic.  Right Ear: External ear normal.  Left Ear: External ear normal.  Eyes: Pupils are equal, round, and reactive to light.  Neck: Normal range of motion. No tracheal deviation present. No thyromegaly present.  Cardiovascular: Normal rate and regular rhythm. Exam reveals no friction rub.  No murmur heard. Respiratory: Effort normal. No respiratory distress. She has no wheezes.  GI: Soft. She exhibits no distension. There is no abdominal tenderness.  Musculoskeletal: Normal range of motion.        General: No deformity. Edema: 1+ to 2+ RLE.  Neurological: She is  alert and oriented to person, place, and time. No cranial nerve deficit.  Patient is alert in no acute distress.  She follows commands.  Her daughter is at bedside. Motor 5/5 UE's. LLE 4- to 4/5. RLE 2/5 (ortho) to 4/5 ADF/PF. No sensory findings.   Skin: Skin is warm and dry.  Hip incisions dressed, staples intact. Bruises and lacerations on outer right arm from fall as well as bruises on inner forearms from blood draws  Psychiatric: She has a normal mood and affect. Her behavior is normal. Judgment and thought content normal.    Results for orders placed or performed during the hospital encounter of 09/23/19 (from the past 48 hour(s))  Prealbumin     Status: Abnormal   Collection Time: 09/26/19  6:54 AM  Result Value Ref Range   Prealbumin 12.2 (L) 18 - 38 mg/dL    Comment: Performed at [facility], [address], [place], [place] [address]  TSH     Status: Abnormal   Collection Time: 09/26/19  6:54 AM  Result Value Ref Range  TSH 5.394 (H) 0.350 - 4.500 uIU/mL    Comment: Performed by a 3rd Generation assay with a functional sensitivity of <=0.01 uIU/mL. Performed at [facility], [place] [address] [place], [place] [address]   CBC     Status: Abnormal   Collection Time: 09/26/19  6:54 AM  Result Value Ref Range   WBC 7.2 4.0 - 10.5 K/uL   RBC 2.31 (L) 3.87 - 5.11 MIL/uL   Hemoglobin 7.4 (L) 12.0 - 15.0 g/dL   HCT 23.2 (L) 36.0 - 46.0 %   MCV 100.4 (H) 80.0 - 100.0 fL   MCH 32.0 26.0 - 34.0 pg   MCHC 31.9 30.0 - 36.0 g/dL   RDW 13.9 11.5 - 15.5 %   Platelets 205 150 - 400 K/uL   nRBC 0.0 0.0 - 0.2 %    Comment: Performed at [facility], [place] [address], [place], [place] [address]  Basic metabolic panel     Status: Abnormal   Collection Time: 09/26/19  6:54 AM  Result Value Ref Range   Sodium 142 135 - 145 mmol/L   Potassium 3.7 3.5 - 5.1 mmol/L   Chloride 108 98 - 111 mmol/L   CO2 26 22 - 32 mmol/L   Glucose, Bld 107 (H) 70 - 99 mg/dL   BUN 32 (H) 8 - 23  mg/dL   Creatinine, Ser 1.61 (H) 0.44 - 1.00 mg/dL   Calcium 8.1 (L) 8.9 - 10.3 mg/dL   GFR calc non Af Amer 29 (L) >60 mL/min   GFR calc Af Amer 34 (L) >60 mL/min   Anion gap 8 5 - 15    Comment: Performed at [facility] [address] [place], [place] [address]  Type and screen [place]     Status: None   Collection Time: 09/26/19  4:38 PM  Result Value Ref Range   ABO/RH(D) A POS    Antibody Screen NEG    Sample Expiration      09/26/2019,2359 Performed at [facility], [place] [address], [place], [place] [address]    Unit Number [id number]    Blood Component Type RED CELLS,LR    Unit division 00    Status of Unit REL FROM [facility]    Transfusion Status OK TO TRANSFUSE    Crossmatch Result Compatible    Unit Number [id number]    Blood Component Type RED CELLS,LR    Unit division 00    Status of Unit REL FROM [facility]    Transfusion Status OK TO TRANSFUSE    Crossmatch Result Compatible   Prepare RBC     Status: None   Collection Time: 09/26/19  4:38 PM  Result Value Ref Range   Order Confirmation      ORDER PROCESSED BY BLOOD BANK Performed at [facility], [place] [address], [place], [place] [address]   ABO/Rh     Status: None   Collection Time: 09/26/19  4:38 PM  Result Value Ref Range   ABO/RH(D)      A POS Performed at [facility] [address], [place], [place] [address]   Type and screen [place]     Status: None (Preliminary result)   Collection Time: 09/26/19 10:19 PM  Result Value Ref Range   ABO/RH(D) A POS    Antibody Screen NEG    Sample Expiration 09/29/2019,2359    Unit Number [id number]    Blood Component  Type RED CELLS,LR    Unit division 00    Status of Unit ISSUED,FINAL    Transfusion Status OK TO TRANSFUSE    Crossmatch Result      Compatible Performed at [facility], [place] [address] [place], [place] [address]    Unit Number [id number]    Blood Component Type  RED CELLS,LR    Unit division 00    Status of Unit ISSUED    Transfusion Status OK TO TRANSFUSE    Crossmatch Result Compatible   Basic metabolic panel     Status: Abnormal   Collection Time: 09/27/19  8:22 AM  Result Value Ref Range   Sodium 139 135 - 145 mmol/L   Potassium 3.9 3.5 - 5.1 mmol/L   Chloride 104 98 - 111 mmol/L   CO2 25 22 - 32 mmol/L   Glucose, Bld 106 (H) 70 - 99 mg/dL   BUN 31 (H) 8 - 23 mg/dL   Creatinine, Ser 1.48 (H) 0.44 - 1.00 mg/dL   Calcium 8.1 (L) 8.9 - 10.3 mg/dL   GFR calc non Af Amer 32 (L) >60 mL/min   GFR calc Af Amer 37 (L) >60 mL/min   Anion gap 10 5 - 15    Comment: Performed at [facility], [place] [address] [place], [place] [address]  Hemoglobin and hematocrit, blood     Status: Abnormal   Collection Time: 09/27/19  8:22 AM  Result Value Ref Range   Hemoglobin 10.9 (L) 12.0 - 15.0 g/dL    Comment: REPEATED TO VERIFY POST TRANSFUSION SPECIMEN    HCT 33.5 (L) 36.0 - 46.0 %    Comment: Performed at [facility] [address] [place], [place] [address]   No results found.     Medical Problem List and Plan: 1.  Decreased functional mobility secondary to right subtrochanteric hip fracture.  Status post IM nailing with ORIF 09/24/2019.  -admit to inpatient rehab  -ELOS 10-14 days, goals supervision to min assist  -discussed with patient short and long term considerations re: safety. 2.  Antithrombotics: -DVT/anticoagulation: Chronic Eliquis  -antiplatelet therapy: N/A 3. Pain Management: Hydrocodone as needed 4. Mood: Provide emotional support  -antipsychotic agents: N/A 5. Neuropsych: This patient is capable of making decisions on her own behalf. 6. Skin/Wound Care: Routine skin checks 7. Fluids/Electrolytes/Nutrition: Routine in and outs with follow-up chemistries 8.  Acute blood loss anemia.    7.4 on 10/15--> transfused 2u PRBC  Follow-up CBC on Monday 9.  PAF.  Continue Eliquis as well as Tambocor 100 mg twice daily,  Tenormin 12.5 mg twice daily.  Cardiac rate controlled 10.  Hyperlipidemia.  Lipitor 11.  Metabolic bone disease.  Continue vitamin D supplements as directed 12.  CKD stage III.  Creatinine baseline 1.49. 13.  Chronic diastolic congestive heart failure monitor for any signs of fluid overload.  Followed by cardiology services.  Demadex remains on hold per cardiology services  -check daily weights 14.  Lymphedema.  She did have a home health nurse to assist in leg wraps when needed  -elevate leg in bed/chair  -TEDS 15.Endometrial cancer.  Patient is followed by Dr. Polly Cobia in [place].  Patient maintained on Megace 80 mg 2 times daily takes for 3 weeks and alternates with Nolvadex 20 mg daily for 3 weeks     Cathlyn Parsons, PA-C 09/27/2019

## 2019-09-27 NOTE — Progress Notes (Signed)
Orthopedic Trauma Service Progress Note  Patient ID: Christina Morgan MRN: [id number] DOB/AGE: 1933-07-02 85 y.o.  Subjective: Doing ok this am  Issues with getting blood yesterday from a logistics standpoint, finally got 2 units PRBCs, feels better  No other complaints this am   purewick still in place    Review of Systems  Constitutional: Negative for chills and fever.  Respiratory: Negative for shortness of breath and wheezing.   Cardiovascular: Negative for chest pain and palpitations.  Gastrointestinal: Negative for abdominal pain, nausea and vomiting.    Objective:   VITALS:   Vitals:   09/27/19 0330 09/27/19 0358 09/27/19 0608 09/27/19 0810  BP: (!) 144/58 (!) 113/100 (!) 126/53 (!) 154/95  Pulse: 60 63 (!) 56 60  Resp: 18  18 18   Temp: 98.3 F (36.8 C) 98.4 F (36.9 C) 97.7 F (36.5 C) 97.8 F (36.6 C)  TempSrc: Oral Oral Oral Oral  SpO2: 95%  98% 98%  Weight:      Height:        Estimated body mass index is 28.79 kg/m as calculated from the following:   Height as of this encounter: 5\' 6"  (1.676 m).   Weight as of this encounter: 80.9 kg.   Intake/Output      10/15 0701 - 10/16 0700 10/16 0701 - 10/17 0700   P.O. 840    Blood 630    Total Intake(mL/kg) 1470 (18.2)    Urine (mL/kg/hr) 1000 (0.5)    Total Output 1000    Net +470           LABS  Results for orders placed or performed during the hospital encounter of 09/23/19 (from the past 24 hour(s))  Type and screen [place]     Status: None   Collection Time: 09/26/19  4:38 PM  Result Value Ref Range   ABO/RH(D) A POS    Antibody Screen NEG    Sample Expiration      09/26/2019,2359 Performed at [facility], [place] [address], [place], [place] [address]    Unit Number [id number]    Blood Component Type RED CELLS,LR    Unit division 00    Status of Unit REL FROM [facility]    Transfusion  Status OK TO TRANSFUSE    Crossmatch Result Compatible    Unit Number [id number]    Blood Component Type RED CELLS,LR    Unit division 00    Status of Unit REL FROM [facility]    Transfusion Status OK TO TRANSFUSE    Crossmatch Result Compatible   Prepare RBC     Status: None   Collection Time: 09/26/19  4:38 PM  Result Value Ref Range   Order Confirmation      ORDER PROCESSED BY BLOOD BANK Performed at [facility], [place] [address] [place], [place] [address]   ABO/Rh     Status: None   Collection Time: 09/26/19  4:38 PM  Result Value Ref Range   ABO/RH(D)      A POS Performed at [facility] [address] [place], [place] [address]   Type and screen [place]     Status: None (Preliminary result)   Collection Time: 09/26/19 10:19 PM  Result Value Ref Range   ABO/RH(D)  A POS    Antibody Screen NEG    Sample Expiration 09/29/2019,2359    Unit Number [id number]    Blood Component Type RED CELLS,LR    Unit division 00    Status of Unit ISSUED,FINAL    Transfusion Status OK TO TRANSFUSE    Crossmatch Result      Compatible Performed at [facility], [place] [address], [place], [place] [address]    Unit Number [id number]    Blood Component Type RED CELLS,LR    Unit division 00    Status of Unit ISSUED    Transfusion Status OK TO TRANSFUSE    Crossmatch Result Compatible   Basic metabolic panel     Status: Abnormal   Collection Time: 09/27/19  8:22 AM  Result Value Ref Range   Sodium 139 135 - 145 mmol/L   Potassium 3.9 3.5 - 5.1 mmol/L   Chloride 104 98 - 111 mmol/L   CO2 25 22 - 32 mmol/L   Glucose, Bld 106 (H) 70 - 99 mg/dL   BUN 31 (H) 8 - 23 mg/dL   Creatinine, Ser 1.48 (H) 0.44 - 1.00 mg/dL   Calcium 8.1 (L) 8.9 - 10.3 mg/dL   GFR calc non Af Amer 32 (L) >60 mL/min   GFR calc Af Amer 37 (L) >60 mL/min   Anion gap 10 5 - 15  Hemoglobin and hematocrit, blood     Status: Abnormal   Collection Time: 09/27/19  8:22 AM   Result Value Ref Range   Hemoglobin 10.9 (L) 12.0 - 15.0 g/dL   HCT 33.5 (L) 36.0 - 46.0 %     PHYSICAL EXAM:   Gen: sitting up in bed, appears well, NAD  Lungs: unlabored Ext:       Right Lower Extremity   Dressing removed  Incisions look great   No erythema    No signs of infection   Distal motor and sensory functions intact Ext warm Chronic pitting edema, appears stable  + DP Pulse SCDs in place  Assessment/Plan: 3 Days Post-Op   Active Problems:   Fall   Closed displaced intertrochanteric fracture of right femur (HCC)   Compression of lumbar vertebra (HCC)   Compression fracture of thoracic vertebra (HCC)   Vitamin D deficiency   Anti-infectives (From admission, onward)   Start     Dose/Rate Route Frequency Ordered Stop   09/25/19 0200  ceFAZolin (ANCEF) IVPB 2g/100 mL premix     2 g 200 mL/hr over 30 Minutes Intravenous Every 6 hours 09/24/19 2057 09/25/19 1028   09/24/19 1700  ceFAZolin (ANCEF) IVPB 2g/100 mL premix     2 g 200 mL/hr over 30 Minutes Intravenous  Once 09/24/19 1656 09/24/19 1723   09/24/19 0800  ceFAZolin (ANCEF) IVPB 2g/100 mL premix  Status:  Discontinued     2 g 200 mL/hr over 30 Minutes Intravenous To ShortStay Surgical 09/24/19 0134 09/24/19 0824    .  POD/HD#: 38  85 y/o female s/p fall with R hip fracture and numerous chronic medical conditions including CKD and PAF on chronic anticoagulation    - fall   - R peritrochanteric hip fracture s/p IMN              WBAT R leg              ROM as tolerated             Dressing changed today    Continue to change as  needed   Ok to clean wounds with soap and water only             Ice and elevate             PT/OT   - Pain management:             Scheduled tylenol             norco as needed for severe pain    - ABL anemia/Hemodynamics             improved after 2 units PRBCs  Monitor   Renal function slightly improved as well   -  Medical issues              Per primary    - DVT/PE prophylaxis:              home eliquis                         Pharmacy adjusting for age and current Cr    - ID:              periop abx completed    - Metabolic Bone Disease:             Profound vitamin d deficiency                          Supplement with daily vitamin d 3 and weekly vitamin d 2                         TSH slightly elevated    Prealbumin suggestive of inadequate nutrition              Will need outpt dexa in 4-8 weeks               Calcitriol low as well, this indicates longstanding vitamin d deficiency    - Activity:             WBAT R leg              PT/OT   - FEN/GI prophylaxis/Foley/Lines:             Reg diet   RD consult    - Impediments to fracture healing:             Osteoporosis/osteopenia  Renal disease              Low energy fracture              Hx of recurrent endometrial cancer    - Dispo             Therapies             SW consult for snf             At this point think pt more likely headed for SNF but will see how she does with therapy   Ortho issues stable   Follow up with ortho 1 week after [place], PA-C [phone] (C) 09/27/2019, 10:31 AM  [facility] [place] [id number] [phone] Domingo Sep (F)

## 2019-09-27 NOTE — Progress Notes (Signed)
Inpatient Rehabilitation Admissions Coordinator  Patient , son, daughter and I have finalized plans for an inpt rehab admission. I have bed available and contacted Dr. Maren Beach for d/c orders. I will arrange admit today.  Danne Baxter, RN, MSN Rehab Admissions Coordinator [phone] 09/27/2019 3:21 PM

## 2019-09-27 NOTE — Progress Notes (Signed)
PROGRESS NOTE    Christina Morgan  ZGY:[id number] DOB: 1933/06/09 DOA: 09/23/2019 PCP: Seward Carol, MD   Brief Narrative: 85 y.o.femalewith medical history significant ofchronic congestive heart failure, CKD stage III, lymphedema, pretension, hyperlipidemia and Paroxysmal atrial fibrillation later status post fall. Patient lives alone at home. According to the daughter she was in her usual state of health until 930 this morning when she spoke to her daughter on the phone and she thinks patient fell somewhere between 1030 and 80. Patient does not know how she fell but she denies loss of consciousness chest pain shortness of breath nausea vomiting abdominal pain diarrhea urinary complaints headache changes in vision. Patient has been gaining weight progressively she gained weight from 1 76-1 81 gradually.  ED Course:Labs in the ED sodium 141 potassium 3.8 BUN 21 creatinine 1.49 white count 8.9 hemoglobin 11.4 platelet count 226. Covidis pending. Echocardiogram February 2020 with normal ejection fraction. X-ray thoracic spine status post T11 and T12 kyphoplasty new compression deformities noted T4-T5 and T7 concerning for fractures of indeterminate age, L1 compression fracture 09/25/2019 patient resting in bed feels pain is controlled with medications denies any chest pain or shortness of breath 10/15-reports pain is worse today.  At the bedside chair.  Just worked with physical therapy this morning.  Subjective: No acute events overnight, received units of blood transfusion overnight. pain is controlled.    Assessment & Plan:   Closed displaced intertrochanteric fracture of right femur: Secondary to fall status post IM nailing 10/13.  Continue weightbearing as tolerated PT OT Tylenol/opiates stool softener and pain control .  PT has advised inpatient rehab OT has a, placed SNF, awaiting for final disposition, CIR consulted as well.  Vitamin D deficiency: Continue to replete with  weekly dose 50,000 units and D3 2000 units twice a day.  Vitamin D 9.7.  Metabolic bone disease with profound vitamin D deficiency outpatient DEXA scan 4 to 8 weeks vitamin D as above.  Hyperlipidemia continue Lipitor.  History of recurrent endometrial cancer: Outpatient follow-up.  History of diastolic CHF with normal EF of 65 to 70%.  Demadex has not been started yet appears dry and evaluate daily and resume soon, followed by cardiology.  History of chronic atrial fibrillation heart rate fairly stable metoprolol dose decreased due to soft/low heart rate, back on Eliquis, flecainide.  Continue as per cardiology.  Chronic leg swelling/Lymphedema reports he is: Going to lymphedema clinic soon.  AKI on CKD stage IIIa baseline creatinine around 1.0-1.4 creatinine.  Improved to 1.4, encourage oral hydration.   Acute on chronic anemia suspected multifactorial from hemodilution, acute blood loss due to fracture/surgery.  Baseline hemoglobin around 12 On admission hb at 11.4, -> 7.4 gm status post unit PRBC and hemoglobin improved to 10.9 gm   Nutrition: Nutrition Problem: Increased nutrient needs Etiology: post-op healing Signs/Symptoms: estimated needs Interventions: Ensure Enlive (each supplement provides 350kcal and 20 grams of protein)  Body mass index is 28.79 kg/m.   DVT prophylaxis: eliquis Code Status: full Family Communication: plan of care discussed with patient in detail. Reports daughter is aware.  Disposition Plan: Remains inpatient pending further plan and disposition inpatient rehab versus skilled nursing facility,   Consultants: Orthopedic surgery  Procedures: IM nailing 10/13  Microbiology:  Antimicrobials: Anti-infectives (From admission, onward)   Start     Dose/Rate Route Frequency Ordered Stop   09/25/19 0200  ceFAZolin (ANCEF) IVPB 2g/100 mL premix     2 g 200 mL/hr over 30 Minutes Intravenous Every 6  hours 09/24/19 2057 09/25/19 1028   09/24/19 1700   ceFAZolin (ANCEF) IVPB 2g/100 mL premix     2 g 200 mL/hr over 30 Minutes Intravenous  Once 09/24/19 1656 09/24/19 1723   09/24/19 0800  ceFAZolin (ANCEF) IVPB 2g/100 mL premix  Status:  Discontinued     2 g 200 mL/hr over 30 Minutes Intravenous To ShortStay [facility] 09/24/19 0134 09/24/19 0824       Objective: Vitals:   09/27/19 0330 09/27/19 0358 09/27/19 0608 09/27/19 0810  BP: (!) 144/58 (!) 113/100 (!) 126/53 (!) 154/95  Pulse: 60 63 (!) 56 60  Resp: 18  18 18   Temp: 98.3 F (36.8 C) 98.4 F (36.9 C) 97.7 F (36.5 C) 97.8 F (36.6 C)  TempSrc: Oral Oral Oral Oral  SpO2: 95%  98% 98%  Weight:      Height:        Intake/Output Summary (Last 24 hours) at 09/27/2019 1122 Last data filed at 09/27/2019 0900 Gross per 24 hour  Intake 1470 ml  Output 1000 ml  Net 470 ml   Filed Weights   09/24/19 0135  Weight: 80.9 kg   Weight change:   Body mass index is 28.79 kg/m.  Intake/Output from previous day: 10/15 0701 - 10/16 0700 In: 1470 [P.O.:840; Blood:630] Out: 1000 [Urine:1000] Intake/Output this shift: Total I/O In: 240 [P.O.:240] Out: -   Examination:  General exam: Appears calm and not in distress on room air, elderly.  HEENT:PERRL,Oral mucosa moist, Ear/Nose normal on gross exam Respiratory system: Bilateral equal air entry, normal vesicular breath sounds, no wheezes or crackles  Cardiovascular system: S1 & S2 heard,No JVD, murmurs. Gastrointestinal system: Abdomen is  soft, non tender, non distended, BS +  Nervous System:Alert and oriented.No focal neurological deficits/moving extremities, sensation intact. Extremities: Right hip surgical site clean dry intact, with mild swelling, dressing in place.  Distal leg edematous chronic from lymphedema  Skin: No rashes, lesions, no icterus MSK: Normal muscle bulk,tone ,power  Medications:  Scheduled Meds: . sodium chloride   Intravenous Once  . acetaminophen  500 mg Oral Q8H  . apixaban  2.5 mg Oral BID   . atenolol  12.5 mg Oral BID  . atorvastatin  10 mg Oral QHS  . cholecalciferol  2,000 Units Oral BID  . docusate sodium  100 mg Oral BID  . feeding supplement (ENSURE ENLIVE)  237 mL Oral Q1500  . flecainide  100 mg Oral BID  . mupirocin ointment  1 application Nasal BID  . senna-docusate  2 tablet Oral QHS  . vitamin C  500 mg Oral Daily  . Vitamin D (Ergocalciferol)  50,000 Units Oral Q7 days   Continuous Infusions:  Data Reviewed: I have personally reviewed following labs and imaging studies  CBC: Recent Labs  Lab 09/23/19 1307 09/24/19 0254 09/25/19 0334 09/26/19 0654 09/27/19 0822  WBC 8.9 7.6 7.7 7.2  --   NEUTROABS 7.1 6.1 6.4  --   --   HGB 11.4* 10.5* 9.2* 7.4* 10.9*  HCT 36.3 31.8* 28.1* 23.2* 33.5*  MCV 100.0 98.8 98.9 100.4*  --   PLT 226 197 192 205  --    Basic Metabolic Panel: Recent Labs  Lab 09/23/19 1307 09/24/19 0254 09/25/19 0334 09/26/19 0654 09/27/19 0822  NA 141 142 145 142 139  K 3.8 4.6 4.2 3.7 3.9  CL 104 105 108 108 104  CO2 28 26 26 26 25   GLUCOSE 118* 118* 145* 107* 106*  BUN 21 23  25* 32* 31*  CREATININE 1.49* 1.60* 1.58* 1.61* 1.48*  CALCIUM 8.6* 8.5* 8.4* 8.1* 8.1*   GFR: Estimated Creatinine Clearance: 30.3 mL/min (A) (by C-G formula based on SCr of 1.48 mg/dL (H)). Liver Function Tests: Recent Labs  Lab 09/24/19 0254 09/25/19 0334  AST 21 20  ALT 13 12  ALKPHOS 74 61  BILITOT 1.2 0.3  PROT 5.3* 4.8*  ALBUMIN 2.8* 2.3*   No results for input(s): LIPASE, AMYLASE in the last 168 hours. No results for input(s): AMMONIA in the last 168 hours. Coagulation Profile: No results for input(s): INR, PROTIME in the last 168 hours. Cardiac Enzymes: No results for input(s): CKTOTAL, CKMB, CKMBINDEX, TROPONINI in the last 168 hours. BNP (last 3 results) Recent Labs    01/04/19 1306  PROBNP 593   HbA1C: No results for input(s): HGBA1C in the last 72 hours. CBG: No results for input(s): GLUCAP in the last 168 hours. Lipid  Profile: No results for input(s): CHOL, HDL, LDLCALC, TRIG, CHOLHDL, LDLDIRECT in the last 72 hours. Thyroid Function Tests: Recent Labs    09/26/19 0654  TSH 5.394*   Anemia Panel: No results for input(s): VITAMINB12, FOLATE, FERRITIN, TIBC, IRON, RETICCTPCT in the last 72 hours. Sepsis Labs: No results for input(s): PROCALCITON, LATICACIDVEN in the last 168 hours.  Recent Results (from the past 240 hour(s))  SARS CORONAVIRUS 2 (TAT 6-24 HRS) Nasopharyngeal Nasopharyngeal Swab     Status: None   Collection Time: 09/23/19  4:58 PM   Specimen: Nasopharyngeal Swab  Result Value Ref Range Status   SARS Coronavirus 2 NEGATIVE NEGATIVE Final    Comment: (NOTE) SARS-CoV-2 target nucleic acids are NOT DETECTED. The SARS-CoV-2 RNA is generally detectable in upper and lower respiratory specimens during the acute phase of infection. Negative results do not preclude SARS-CoV-2 infection, do not rule out co-infections with other pathogens, and should not be used as the sole basis for treatment or other patient management decisions. Negative results must be combined with clinical observations, patient history, and epidemiological information. The expected result is Negative. Fact Sheet for Patients: SugarRoll.be Fact Sheet for Healthcare Providers: https://www.woods-mathews.com/ This test is not yet approved or cleared by the [place] FDA and  has been authorized for detection and/or diagnosis of SARS-CoV-2 by FDA under an Emergency Use Authorization (EUA). This EUA will remain  in effect (meaning this test can be used) for the duration of the COVID-19 declaration under Section 56 4(b)(1) of the Act, 21 U.S.C. section 360bbb-3(b)(1), unless the authorization is terminated or revoked sooner. Performed at [facility], [place] [address], [place], [place] [address]   MRSA PCR Screening     Status: Abnormal   Collection Time: 09/24/19  2:16  AM   Specimen: Nasal Mucosa; Nasopharyngeal  Result Value Ref Range Status   MRSA by PCR POSITIVE (A) NEGATIVE Final    Comment:        The GeneXpert MRSA Assay (FDA approved for NASAL specimens only), is one component of a comprehensive MRSA colonization surveillance program. It is not intended to diagnose MRSA infection nor to guide or monitor treatment for MRSA infections. RESULT CALLED TO, READ BACK BY AND VERIFIED WITH: WORTH,S RN [phone] 09/24/2019 MITCHELL,L Performed at [facility], [place] [address], [place], [place] [address]       Radiology Studies: No results found.    LOS: 4 days   Time spent: More than 50% of that time was spent in counseling and/or coordination of care.  Antonieta Pert, MD [facility]  09/27/2019, 11:22 AM

## 2019-09-27 NOTE — H&P (Signed)
Physical Medicine and Rehabilitation Admission H&P        Chief Complaint  Patient presents with   Fall   Hip Injury  : HPI: Christina Morgan is an 85 year old right-handed female with history of chronic diastolic congestive heart failure, endometrial cancer followed by Dr. Polly Cobia in [place] maintained on Megace as well as Nolvadex, chronic back pain with kyphoplasty May 2020, CKD stage III, lymphedema, hypertension, PAF maintained on Eliquis as well as Tambocor.  Per chart review patient lives alone.  1 level home 4 steps to entry.  Modified independent with a walker however reportedly sedentary and has had other falls in the past.  She has an aide 2 times a week to assist with some ADLs.  She does have a daughter in the area who checks on her routinely.  Presented 09/23/2019 after reported fall without loss of consciousness.  Denies any chest pain or shortness of breath related to fall.  Noted BUN 21 creatinine 1.49 hemoglobin 11.4, COVID negative.  Echocardiogram 09/24/2019 showed ejection fraction of 70%.  X-rays and imaging revealed right subtrochanteric hip fracture.  Underwent IM nailing with ORIF 09/24/2019 per Dr. Marcelino Scot.  Hospital course pain management weightbearing as tolerated right lower extremity.  Chronic Eliquis has been resumed postoperatively.  Acute blood loss anemia 7.4 and transfused 2 units packed red blood cells latest hemoglobin 10.9.  Therapy evaluations completed and patient was admitted for a comprehensive rehab program   Review of Systems  Constitutional: Negative for chills and fever.  HENT: Negative for hearing loss.   Eyes: Negative for blurred vision and double vision.  Respiratory: Negative for cough and shortness of breath.   Cardiovascular: Positive for palpitations and leg swelling.  Gastrointestinal: Positive for constipation. Negative for heartburn, nausea and vomiting.  Genitourinary: Negative for dysuria, flank pain and hematuria.    Musculoskeletal: Positive for joint pain and myalgias.  Skin: Negative for rash.  All other systems reviewed and are negative.       Past Medical History:  Diagnosis Date   Cancer [facility])      endometrial ca   Chronic diastolic CHF (congestive heart failure) (HCC)     Chronic edema     CKD (chronic kidney disease), stage III     Dyslipidemia     Fracture of left humerus     History of cardiovascular stress test      Lexiscan Myoview (06/2014): No ischemia or scar, not gated, low risk   Hx of echocardiogram      Echo (05/02/14): EF 60% to 65%. No regional wall motion abnormalities. Mild AI, mildly dilated aortic root (37 mm), trivial MR, trivial TR   Hyperlipidemia     Hypertension     Lipoma of skin      multiple   Osteopenia     PAF (paroxysmal atrial fibrillation) (HCC)      failed DCCV 05/2014 >> Flecainide started >> DCCV 7/15 sucessful;  f/u ETT neg for pro-arrhythmia >> recurrent AF/AFL >> Flecainide inc to 100 bid with repeat DCCV 9/15   Vitamin D deficiency 09/25/2019         Past Surgical History:  Procedure Laterality Date   CARDIOVERSION N/A 06/05/2014    Procedure: CARDIOVERSION;  Surgeon: Sueanne Margarita, MD;  Location: [place];  Service: Cardiovascular;  Laterality: N/A;   CARDIOVERSION N/A 07/11/2014    Procedure: CARDIOVERSION;  Surgeon: Sueanne Margarita, MD;  Location: [place];  Service: Cardiovascular;  Laterality: N/A;  CARDIOVERSION N/A 09/04/2014    Procedure: CARDIOVERSION;  Surgeon: Sueanne Margarita, MD;  Location: [facility] ENDOSCOPY;  Service: Cardiovascular;  Laterality: N/A;   COLONOSCOPY   2008   COLONOSCOPY N/A 05/20/2015    Procedure: COLONOSCOPY;  Surgeon: Ronald Lobo, MD;  Location: [facility] ENDOSCOPY;  Service: Endoscopy;  Laterality: N/A;   FEMUR IM NAIL Right 09/24/2019    Procedure: INTRAMEDULLARY (IM) NAIL FEMORAL;  Surgeon: Altamese Brevard, MD;  Location: [facility];  Service: Orthopedics;  Laterality: Right;   IR KYPHO EA ADDL LEVEL  THORACIC OR LUMBAR   04/12/2019   IR KYPHO THORACIC WITH BONE BIOPSY   04/12/2019   ROBOTIC ASSISTED SUPRACERVICAL HYSTERECTOMY WITH BILATERAL SALPINGO OOPHERECTOMY   11/06/2012    and bilateral pelvic lymph node dissection         Family History  Problem Relation Age of Onset   Cancer Father 52        metastatic oropharyngeal ca   Heart attack Mother     Coronary artery disease Mother      Social History:  reports that she has never smoked. She has never used smokeless tobacco. She reports that she does not drink alcohol or use drugs. Allergies:       Allergies  Allergen Reactions   Penicillins Other (See Comments)      Dizziness Has patient had a PCN reaction causing immediate rash, facial/tongue/throat swelling, SOB or lightheadedness with hypotension: No Has patient had a PCN reaction causing severe rash involving mucus membranes or skin necrosis: No Has patient had a PCN reaction that required hospitalization: No Has patient had a PCN reaction occurring within the last 10 years: No If all of the above answers are "NO", then may proceed with Cephalosporin use.          Medications Prior to Admission  Medication Sig Dispense Refill   apixaban (ELIQUIS) 5 MG TABS tablet Take 5 mg by mouth 2 (two) times daily.       atenolol (TENORMIN) 50 MG tablet TAKE 1 TABLET BY MOUTH TWICE DAILY (Patient taking differently: Take 50 mg by mouth 2 (two) times daily. ) 60 tablet 11   atorvastatin (LIPITOR) 10 MG tablet Take 10 mg by mouth at bedtime.        flecainide (TAMBOCOR) 100 MG tablet TAKE 1 TABLET BY MOUTH TWICE DAILY (Patient taking differently: Take 100 mg by mouth 2 (two) times daily. ) 180 tablet 3   hydrALAZINE (APRESOLINE) 10 MG tablet Take 1 tablet (10 mg total) by mouth 2 (two) times a day. 180 tablet 3   megestrol (MEGACE) 40 MG tablet Take 80 mg by mouth 2 (two) times daily. Take for 3 weeks , then alternate with tamoxifen 20 mg 1 daily for 3 weeks, etc.        potassium chloride SA (K-DUR) 20 MEQ tablet Take 1 tablet (20 mEq total) by mouth 2 (two) times daily. 180 tablet 3   tamoxifen (NOLVADEX) 20 MG tablet Take 20 mg by mouth 2 (two) times daily. Alternate every 3 weeks with megestrol.       torsemide (DEMADEX) 20 MG tablet Take 2 tablets (40 mg total) by mouth 2 (two) times daily. 360 tablet 3      Drug Regimen Review Drug regimen was reviewed and remains appropriate with no significant issues identified   Home: Home Living Family/patient expects to be discharged to:: Private residence Living Arrangements: Alone Available Help at Discharge: Family, Available PRN/intermittently, Personal care attendant Type of Home:  House Home Access: Stairs to enter [employer] of Steps: 4 Entrance Stairs-Rails: Right, Left, Can reach both Home Layout: One level Bathroom Shower/Tub: Chiropodist: Standard Home Equipment: Civil engineer, contracting, Bedside commode, Environmental consultant - 2 wheels, Transport chair, Radiation protection practitioner Equipment: Reacher   Functional History: Prior Function Level of Independence: Needs assistance Gait / Transfers Assistance Needed: Mod I with walker/ uses lift chair all day ADL's / Homemaking Assistance Needed: aide sponge bathing pt 2 x week recently because of leg wraps but used to sit on chair in tub shower   Functional Status:  Mobility: Bed Mobility Overal bed mobility: Needs Assistance Bed Mobility: Supine to Sit, Sit to Supine Supine to sit: Max assist, Mod assist Sit to supine: Max assist General bed mobility comments: Needed more assist for LEs especially assist with right LE and elevation of trunk.  Transfers Overall transfer level: Needs assistance Equipment used: Rolling walker (2 wheeled) Transfer via Lift Equipment: Stedy Transfers: Sit to/from Stand, [employer] Transfers Sit to Stand: Mod assist, From elevated surface, +2 physical assistance Stand pivot transfers: Total assist, +2  safety/equipment General transfer comment: unable due to pain and fatigue, PT used Stedy in earlier session today   ADL: ADL Overall ADL's : Needs assistance/impaired Eating/Feeding: Independent, Sitting Grooming: Wash/dry hands, Wash/dry face, Min guard, Sitting Upper Body Bathing: Sitting, Minimal assistance Lower Body Bathing: Maximal assistance Upper Body Dressing : Minimal assistance, Sitting Lower Body Dressing: Total assistance Toilet Transfer Details (indicate cue type and reason): unable. will need Stedy or +2 assist Toileting- Clothing Manipulation and Hygiene: Total assistance, Bed level Functional mobility during ADLs: Total assistance, Moderate assistance, +2 for physical assistance, +2 for safety/equipment(unable with OT, however mod A +2 - total A with Stedy with PT)   Cognition: Cognition Overall Cognitive Status: Within Functional Limits for tasks assessed Orientation Level: Oriented X4 Cognition Arousal/Alertness: Awake/alert Behavior During Therapy: WFL for tasks assessed/performed Overall Cognitive Status: Within Functional Limits for tasks assessed   Physical Exam: Blood pressure (!) 154/95, pulse 60, temperature 97.8 F (36.6 C), temperature source Oral, resp. rate 18, height 5\' 6"  (1.676 m), weight 80.9 kg, SpO2 98 %. Physical Exam  Constitutional: She is oriented to person, place, and time. She appears well-developed and well-nourished.  HENT:  Head: Normocephalic and atraumatic.  Right Ear: External ear normal.  Left Ear: External ear normal.  Eyes: Pupils are equal, round, and reactive to light.  Neck: Normal range of motion. No tracheal deviation present. No thyromegaly present.  Cardiovascular: Normal rate and regular rhythm. Exam reveals no friction rub.  No murmur heard. Respiratory: Effort normal. No respiratory distress. She has no wheezes.  GI: Soft. She exhibits no distension. There is no abdominal tenderness.  Musculoskeletal: Normal range of  motion.        General: No deformity. Edema: 1+ to 2+ RLE.  Neurological: She is alert and oriented to person, place, and time. No cranial nerve deficit.  Patient is alert in no acute distress.  She follows commands.  Her daughter is at bedside. Motor 5/5 UE's. LLE 4- to 4/5. RLE 2/5 (ortho) to 4/5 ADF/PF. No sensory findings.   Skin: Skin is warm and dry.  Hip incisions dressed, staples intact. Bruises and lacerations on outer right arm from fall as well as bruises on inner forearms from blood draws  Psychiatric: She has a normal mood and affect. Her behavior is normal. Judgment and thought content normal.      Lab Results Last 48  Hours        Results for orders placed or performed during the hospital encounter of 09/23/19 (from the past 48 hour(s))  Prealbumin     Status: Abnormal    Collection Time: 09/26/19  6:54 AM  Result Value Ref Range    Prealbumin 12.2 (L) 18 - 38 mg/dL      Comment: Performed at [facility] [address], [place], [place] [address]  TSH     Status: Abnormal    Collection Time: 09/26/19  6:54 AM  Result Value Ref Range    TSH 5.394 (H) 0.350 - 4.500 uIU/mL      Comment: Performed by a 3rd Generation assay with a functional sensitivity of <=0.01 uIU/mL. Performed at [facility], [place] [address], [place], [place] [address]    CBC     Status: Abnormal    Collection Time: 09/26/19  6:54 AM  Result Value Ref Range    WBC 7.2 4.0 - 10.5 K/uL    RBC 2.31 (L) 3.87 - 5.11 MIL/uL    Hemoglobin 7.4 (L) 12.0 - 15.0 g/dL    HCT 23.2 (L) 36.0 - 46.0 %    MCV 100.4 (H) 80.0 - 100.0 fL    MCH 32.0 26.0 - 34.0 pg    MCHC 31.9 30.0 - 36.0 g/dL    RDW 13.9 11.5 - 15.5 %    Platelets 205 150 - 400 K/uL    nRBC 0.0 0.0 - 0.2 %      Comment: Performed at [facility], [place] [address], [place], [place] [address]  Basic metabolic panel     Status: Abnormal    Collection Time: 09/26/19  6:54 AM  Result Value Ref Range    Sodium 142 135 - 145 mmol/L     Potassium 3.7 3.5 - 5.1 mmol/L    Chloride 108 98 - 111 mmol/L    CO2 26 22 - 32 mmol/L    Glucose, Bld 107 (H) 70 - 99 mg/dL    BUN 32 (H) 8 - 23 mg/dL    Creatinine, Ser 1.61 (H) 0.44 - 1.00 mg/dL    Calcium 8.1 (L) 8.9 - 10.3 mg/dL    GFR calc non Af Amer 29 (L) >60 mL/min    GFR calc Af Amer 34 (L) >60 mL/min    Anion gap 8 5 - 15      Comment: Performed at [facility] [address], [place], [place] [address]  Type and screen [place]     Status: None    Collection Time: 09/26/19  4:38 PM  Result Value Ref Range    ABO/RH(D) A POS      Antibody Screen NEG      Sample Expiration          09/26/2019,2359 Performed at [facility], [place] [address] [place],  [address]      Unit Number [id number]      Blood Component Type RED CELLS,LR      Unit division 00      Status of Unit REL FROM [facility]      Transfusion Status OK TO TRANSFUSE      Crossmatch Result Compatible      Unit Number [id number]      Blood Component Type RED CELLS,LR      Unit division 00      Status of Unit REL FROM [facility]  Transfusion Status OK TO TRANSFUSE      Crossmatch Result Compatible    Prepare RBC     Status: None    Collection Time: 09/26/19  4:38 PM  Result Value Ref Range    Order Confirmation          ORDER PROCESSED BY BLOOD BANK Performed at [facility], [place] [address] [place], [place] [address]    ABO/Rh     Status: None    Collection Time: 09/26/19  4:38 PM  Result Value Ref Range    ABO/RH(D)          A POS Performed at [facility] [address], [place], [place] [address]    Type and screen [place]     Status: None (Preliminary result)    Collection Time: 09/26/19 10:19 PM  Result Value Ref Range    ABO/RH(D) A POS      Antibody Screen NEG      Sample Expiration 09/29/2019,2359      Unit Number [id number]      Blood Component Type RED CELLS,LR      Unit division 00      Status of Unit  ISSUED,FINAL      Transfusion Status OK TO TRANSFUSE      Crossmatch Result          Compatible Performed at [facility], [place] [address] [place], [place] [address]      Unit Number [id number]      Blood Component Type RED CELLS,LR      Unit division 00      Status of Unit ISSUED      Transfusion Status OK TO TRANSFUSE      Crossmatch Result Compatible    Basic metabolic panel     Status: Abnormal    Collection Time: 09/27/19  8:22 AM  Result Value Ref Range    Sodium 139 135 - 145 mmol/L    Potassium 3.9 3.5 - 5.1 mmol/L    Chloride 104 98 - 111 mmol/L    CO2 25 22 - 32 mmol/L    Glucose, Bld 106 (H) 70 - 99 mg/dL    BUN 31 (H) 8 - 23 mg/dL    Creatinine, Ser 1.48 (H) 0.44 - 1.00 mg/dL    Calcium 8.1 (L) 8.9 - 10.3 mg/dL    GFR calc non Af Amer 32 (L) >60 mL/min    GFR calc Af Amer 37 (L) >60 mL/min    Anion gap 10 5 - 15      Comment: Performed at [facility], [place] [address] [place], [place] [address]  Hemoglobin and hematocrit, blood     Status: Abnormal    Collection Time: 09/27/19  8:22 AM  Result Value Ref Range    Hemoglobin 10.9 (L) 12.0 - 15.0 g/dL      Comment: REPEATED TO VERIFY POST TRANSFUSION SPECIMEN      HCT 33.5 (L) 36.0 - 46.0 %      Comment: Performed at [facility] [address], [place], [place] [address]     Imaging Results (Last 48 hours)  No results found.           Medical Problem List and Plan: 1.  Decreased functional mobility secondary to right subtrochanteric hip fracture.  Status post IM nailing with ORIF 09/24/2019.             -admit to inpatient rehab             -  ELOS 10-14 days, goals supervision to min assist             -discussed with patient short and long term considerations re: safety. 2.  Antithrombotics: -DVT/anticoagulation: Chronic Eliquis             -antiplatelet therapy: N/A 3. Pain Management: Hydrocodone as needed 4. Mood: Provide emotional support             -antipsychotic agents:  N/A 5. Neuropsych: This patient is capable of making decisions on her own behalf. 6. Skin/Wound Care: Routine skin checks 7. Fluids/Electrolytes/Nutrition: Routine in and outs with follow-up chemistries 8.  Acute blood loss anemia.              7.4 on 10/15--> transfused 2u PRBC             Follow-up CBC on Monday 9.  PAF.  Continue Eliquis as well as Tambocor 100 mg twice daily, Tenormin 12.5 mg twice daily.  Cardiac rate controlled 10.  Hyperlipidemia.  Lipitor 11.  Metabolic bone disease.  Continue vitamin D supplements as directed 12.  CKD stage III.  Creatinine baseline 1.49. 13.  Chronic diastolic congestive heart failure monitor for any signs of fluid overload.  Followed by cardiology services.  Demadex remains on hold per cardiology services             -check daily weights 14.  Lymphedema.  She did have a home health nurse to assist in leg wraps when needed             -elevate leg in bed/chair             -TEDS 15.Endometrial cancer.  Patient is followed by Dr. Polly Cobia in [place].  Patient maintained on Megace 80 mg 2 times daily takes for 3 weeks and alternates with Nolvadex 20 mg daily for 3 weeks       Cathlyn Parsons, PA-C 09/27/2019  I have personally performed a face to face diagnostic evaluation of this patient and formulated the key components of the plan.  Additionally, I have personally reviewed laboratory data, imaging studies, as well as relevant notes and concur with the physician assistant's documentation above.  The patient's status has not changed from the original H&P.  Any changes in documentation from the acute care chart have been noted above.  Meredith Staggers, MD, [facility]

## 2019-09-27 NOTE — Consult Note (Signed)
Inpatient Rehabilitation Admissions Coordinator  Inpatient rehab consult received. I met with patient and her daughter at bedside for rehab assessment. Per daughter, patient had needed more assistance at home and was sedentary pta but had declined the need for hired caregiver assistance. She has fallen 3 times recently. I await therapy updates today, further family discussion and then I will return today to assist with planning possible Cir admit vs SNF.  Danne Baxter, RN, MSN Rehab Admissions Coordinator [phone] 09/27/2019 12:17 PM

## 2019-09-27 NOTE — Plan of Care (Signed)
  Problem: Education: Goal: Knowledge of General Education information will improve Description: Including pain rating scale, medication(s)/side effects and non-pharmacologic comfort measures Outcome: Progressing   Problem: Clinical Measurements: Goal: Will remain free from infection Outcome: Progressing   Problem: Activity: Goal: Risk for activity intolerance will decrease Outcome: Progressing   Problem: Coping: Goal: Level of anxiety will decrease Outcome: Progressing   Problem: Elimination: Goal: Will not experience complications related to urinary retention Outcome: Progressing   Problem: Pain Managment: Goal: General experience of comfort will improve Outcome: Progressing   Problem: Safety: Goal: Ability to remain free from injury will improve Outcome: Progressing

## 2019-09-27 NOTE — Discharge Summary (Signed)
Physician Discharge Summary  Christina Morgan KWI:[id number] DOB: 09/24/1933 DOA: 09/23/2019  PCP: Seward Carol, MD  Admit date: 09/23/2019 Discharge date: 09/27/2019  Admitted From: home Disposition:  CIR  Recommendations for Outpatient Follow-up:  1. Follow up with PCP in 1-2 weeks 2. Please obtain BMP/CBC in 4-5 days at CIR 3. Please follow up on the following pending results:  Home Health:NO  Equipment/Devices:NONE  Discharge Condition: Stable CODE STATUS: FULL Diet recommendation: Heart Healthy  Brief/Interim Summary:  85 y.o.femalewith medical history significant ofchronic congestive heart failure, CKD stage III, lymphedema, pretension, hyperlipidemia and Paroxysmal atrial fibrillation later status post fall. Patient lives alone at home. According to the daughter she was in her usual state of health until 930 this morning when she spoke to her daughter on the phone and she thinks patient fell somewhere between 1030 and 26. Patient does not know how she fell but she denies loss of consciousness chest pain shortness of breath nausea vomiting abdominal pain diarrhea urinary complaints headache changes in vision. Patient has been gaining weight progressively she gained weight from 1 83-1 83 gradually.  ED Course:Labs in the ED sodium 141 potassium 3.8 BUN 21 creatinine 1.49 white count 8.9 hemoglobin 11.4 platelet count 226. Covidis pending. Echocardiogram February 2020 with normal ejection fraction. X-ray thoracic spine status post T11 and T12 kyphoplasty new compression deformities noted T4-T5 and T7 concerning for fractures of indeterminate age, L1 compression fracture 09/25/2019 patient resting in bed feels pain is controlled with medications denies any chest pain or shortness of breath 10/15-reports pain is worse today.  At the bedside chair.  Just worked with physical therapy this morning. 10/16-received 2 units PRBC overnight per orthopedic and hemoglobin  improved this time pain is controlled.  Stable at 10 g range Insurance approved and patient is being discharged to inpatient rehab today  Discharge Diagnoses:  Active Problems:   Fall   Closed displaced intertrochanteric fracture of right femur (HCC)   Compression of lumbar vertebra (HCC)   Compression fracture of thoracic vertebra (HCC)   Vitamin D deficiency   PAF (paroxysmal atrial fibrillation) ([place])  Closed displaced intertrochanteric fracture of right femur: Secondary to fall status post IM nailing 10/13.  Continue weightbearing as tolerated PT OT Tylenol/opiates stool softener and pain control .  PT has advised inpatient rehab OT has a, placed SNF, awaiting for final disposition, CIR consulted as well.  Vitamin D deficiency: Continue to replete with weekly dose 50,000 units and D3 2000 units twice a day.  Vitamin D 9.7.  Metabolic bone disease with profound vitamin D deficiency outpatient DEXA scan 4 to 8 weeks vitamin D as above.  Hyperlipidemia continue Lipitor.  History of recurrent endometrial cancer: Outpatient follow-up.  History of diastolic CHF with normal EF of 65 to 70%.  Demadex has not been started yet appears dry and evaluate daily and resume soon, followed by cardiology.resuming atenolol and hydralazine at lower dose.  Resume torsemide at 20 mg twice daily in 2 days at CIR and monitor renal function closely  History of chronic atrial fibrillation heart rate fairly stable metoprolol dose decreased due to soft/low heart rate, back on Eliquis, flecainide.  Continue as per cardiology.  Chronic leg swelling/Lymphedema reports he is: Going to lymphedema clinic soon.  She is on torsemide 40 mg twice daily.  Amlodipine resuming atenolol and hydralazine at lower dose  AKI on CKD stage IIIa baseline creatinine around 1.0-1.4 creatinine.  Improved to 1.4, encourage oral hydration.  Torsemide has been on hold  was taking 40 twice daily.  Creatinine improved can resume in  day or 2 at lower dose likely 20 twice daily-we will advise checking renal function in next 4 to 5 days upon starting torsemide in rehab  Acute on chronic anemia suspected multifactorial from hemodilution, acute blood loss due to fracture/surgery.  Baseline hemoglobin around 12 On admission hb at 11.4, -> 7.4 gm status post unit PRBC and hemoglobin improved to 10.9 gm   Nutrition: Nutrition Problem: Increased nutrient needs Etiology: post-op healing Signs/Symptoms: estimated needs Interventions: Ensure Enlive (each supplement provides 350kcal and 20 grams of protein)  Body mass index is 28.79 kg/m.   DVT prophylaxis: eliquis Code Status: full Family Communication: plan of care discussed with patient in detail. Reports daughter is aware.  Disposition Plan: CIR today  Consultants: Orthopedic surgery  Procedures: IM nailing 10/13  Discharge Instructions  Discharge Instructions    Diet - low sodium heart healthy   Complete by: As directed    Increase activity slowly   Complete by: As directed      Allergies as of 09/27/2019      Reactions   Penicillins Other (See Comments)   Dizziness Has patient had a PCN reaction causing immediate rash, facial/tongue/throat swelling, SOB or lightheadedness with hypotension: No Has patient had a PCN reaction causing severe rash involving mucus membranes or skin necrosis: No Has patient had a PCN reaction that required hospitalization: No Has patient had a PCN reaction occurring within the last 10 years: No If all of the above answers are "NO", then may proceed with Cephalosporin use.      Medication List    TAKE these medications   apixaban 2.5 MG Tabs tablet Commonly known as: ELIQUIS Take 1 tablet (2.5 mg total) by mouth 2 (two) times daily. What changed:   medication strength  how much to take   ascorbic acid 500 MG tablet Commonly known as: VITAMIN C Take 1 tablet (500 mg total) by mouth daily. Start taking on: September 28, 2019   atenolol 25 MG tablet Commonly known as: TENORMIN Take 0.5 tablets (12.5 mg total) by mouth 2 (two) times daily. What changed:   medication strength  how much to take   atorvastatin 10 MG tablet Commonly known as: LIPITOR Take 10 mg by mouth at bedtime.   docusate sodium 100 MG capsule Commonly known as: COLACE Take 1 capsule (100 mg total) by mouth 2 (two) times daily.   feeding supplement (ENSURE ENLIVE) Liqd Take 237 mLs by mouth daily in the afternoon.   flecainide 100 MG tablet Commonly known as: TAMBOCOR TAKE 1 TABLET BY MOUTH TWICE DAILY   hydrALAZINE 25 MG tablet Commonly known as: APRESOLINE Take 0.5 tablets (12.5 mg total) by mouth 2 (two) times daily. What changed:   medication strength  how much to take  when to take this   megestrol 40 MG tablet Commonly known as: MEGACE Take 80 mg by mouth 2 (two) times daily. Take for 3 weeks , then alternate with tamoxifen 20 mg 1 daily for 3 weeks, etc.   potassium chloride SA 20 MEQ tablet Commonly known as: KLOR-CON Take 1 tablet (20 mEq total) by mouth 2 (two) times daily.   tamoxifen 20 MG tablet Commonly known as: NOLVADEX Take 20 mg by mouth 2 (two) times daily. Alternate every 3 weeks with megestrol.   torsemide 20 MG tablet Commonly known as: DEMADEX Take 1 tablet (20 mg total) by mouth 2 (two) times daily. Start taking  on: September 29, 2019 What changed:   how much to take  These instructions start on September 29, 2019. If you are unsure what to do until then, ask your doctor or other care provider.   Vitamin D (Ergocalciferol) 1.25 MG (50000 UT) Caps capsule Commonly known as: DRISDOL Take 1 capsule (50,000 Units total) by mouth every 7 (seven) days. Start taking on: October 02, 2019   Vitamin D3 25 MCG tablet Commonly known as: Vitamin D Take 2 tablets (2,000 Units total) by mouth 2 (two) times daily.      Follow-up Information    Altamese Siloam, MD. Schedule an appointment as  soon as possible for a visit in 1 week(s).   Specialty: Orthopedic Surgery Contact information: [place] [address] [phone]        Sueanne Margarita, MD Follow up on 10/28/2019.   Specialty: Cardiology Why: 8:40 AM this will be a virtual visit - Dr. Radford Pax will call you. if you have access to a smart phone she could do a video call.   the office will call you sooner to appt and discuss.  Contact information: [address] [place] [address] [phone]          Allergies  Allergen Reactions  . Penicillins Other (See Comments)    Dizziness Has patient had a PCN reaction causing immediate rash, facial/tongue/throat swelling, SOB or lightheadedness with hypotension: No Has patient had a PCN reaction causing severe rash involving mucus membranes or skin necrosis: No Has patient had a PCN reaction that required hospitalization: No Has patient had a PCN reaction occurring within the last 10 years: No If all of the above answers are "NO", then may proceed with Cephalosporin use.      Procedures/Studies: Dg Chest 1 View  Result Date: 09/23/2019 CLINICAL DATA:  Fall. EXAM: CHEST  1 VIEW COMPARISON:  March 30, 2018. FINDINGS: Mild cardiomegaly is noted. Atherosclerosis of thoracic aorta is noted. No pneumothorax is noted. Lungs are clear. Bony thorax is unremarkable. IMPRESSION: No acute cardiopulmonary abnormality seen. Aortic Atherosclerosis (ICD10-I70.0). Electronically Signed   By: Marijo Conception M.D.   On: 09/23/2019 15:58   Dg Thoracic Spine 2 View  Result Date: 09/23/2019 CLINICAL DATA:  Back pain after fall. EXAM: THORACIC SPINE 2 VIEWS COMPARISON:  Apr 24, 2019. FINDINGS: Status post T11 and T12 kyphoplasty. New compression deformities are seen involving the T7, T5 and T4 vertebral bodies concerning for fractures of indeterminate age. No spondylolisthesis is noted. Diffuse osteopenia is noted. IMPRESSION: Status post T11 and T12  kyphoplasty. New compression deformities are noted at T4, T5 and T7 concerning for fractures of indeterminate age. MRI may be performed for further evaluation. Electronically Signed   By: Marijo Conception M.D.   On: 09/23/2019 15:55   Dg Lumbar Spine Complete  Result Date: 09/23/2019 CLINICAL DATA:  Low back pain after fall. EXAM: LUMBAR SPINE - COMPLETE 4+ VIEW COMPARISON:  December 18, 2017. FINDINGS: Mild levoscoliosis of lumbar spine is noted. Status post kyphoplasty of T11 and T12 vertebral bodies. Diffuse osteopenia is noted. Severe compression deformity of L1 vertebral body is noted concerning for fracture of indeterminate age. Mild degenerative disc disease is noted at L2-3, L3-4 and L4-5. IMPRESSION: Interval development of compression deformity of L1 vertebral body concerning for fracture of indeterminate age. MRI may be performed to evaluate for acute fracture. Multilevel degenerative disc disease is noted. Diffuse osteopenia. Electronically Signed   By: Jeneen Rinks  Murlean Caller M.D.   On: 09/23/2019 15:57   Ct Head Wo Contrast  Result Date: 09/23/2019 CLINICAL DATA:  Posttraumatic headache after fall. No loss of consciousness. EXAM: CT HEAD WITHOUT CONTRAST CT CERVICAL SPINE WITHOUT CONTRAST TECHNIQUE: Multidetector CT imaging of the head and cervical spine was performed following the standard protocol without intravenous contrast. Multiplanar CT image reconstructions of the cervical spine were also generated. COMPARISON:  None. FINDINGS: CT HEAD FINDINGS Brain: Mild chronic ischemic white matter disease is noted. Large calcified left parietal meningioma is noted. No mass effect or midline shift is noted. No hemorrhage or acute infarction is noted. Ventricular size is within normal limits. Vascular: No hyperdense vessel or unexpected calcification. Skull: Normal. Negative for fracture or focal lesion. Sinuses/Orbits: No acute finding. Other: None. CT CERVICAL SPINE FINDINGS Alignment: Minimal grade 1  anterolisthesis of C5-6 is noted secondary to posterior facet joint hypertrophy. Skull base and vertebrae: No acute fracture. No primary bone lesion or focal pathologic process. Soft tissues and spinal canal: No prevertebral fluid or swelling. No visible canal hematoma. Disc levels: Mild degenerative disc disease is noted at C5-6 and C6-7. Upper chest: Negative. Other: Degenerative changes are seen involving the right-sided posterior facet joints. IMPRESSION: Mild chronic ischemic white matter disease. Large calcified left parietal meningioma. No acute intracranial abnormality seen. Mild multilevel degenerative disc disease. No acute abnormality seen in the cervical spine. Electronically Signed   By: Marijo Conception M.D.   On: 09/23/2019 14:14   Ct Cervical Spine Wo Contrast  Result Date: 09/23/2019 CLINICAL DATA:  Posttraumatic headache after fall. No loss of consciousness. EXAM: CT HEAD WITHOUT CONTRAST CT CERVICAL SPINE WITHOUT CONTRAST TECHNIQUE: Multidetector CT imaging of the head and cervical spine was performed following the standard protocol without intravenous contrast. Multiplanar CT image reconstructions of the cervical spine were also generated. COMPARISON:  None. FINDINGS: CT HEAD FINDINGS Brain: Mild chronic ischemic white matter disease is noted. Large calcified left parietal meningioma is noted. No mass effect or midline shift is noted. No hemorrhage or acute infarction is noted. Ventricular size is within normal limits. Vascular: No hyperdense vessel or unexpected calcification. Skull: Normal. Negative for fracture or focal lesion. Sinuses/Orbits: No acute finding. Other: None. CT CERVICAL SPINE FINDINGS Alignment: Minimal grade 1 anterolisthesis of C5-6 is noted secondary to posterior facet joint hypertrophy. Skull base and vertebrae: No acute fracture. No primary bone lesion or focal pathologic process. Soft tissues and spinal canal: No prevertebral fluid or swelling. No visible canal  hematoma. Disc levels: Mild degenerative disc disease is noted at C5-6 and C6-7. Upper chest: Negative. Other: Degenerative changes are seen involving the right-sided posterior facet joints. IMPRESSION: Mild chronic ischemic white matter disease. Large calcified left parietal meningioma. No acute intracranial abnormality seen. Mild multilevel degenerative disc disease. No acute abnormality seen in the cervical spine. Electronically Signed   By: Marijo Conception M.D.   On: 09/23/2019 14:14   Pelvis Portable  Result Date: 09/24/2019 CLINICAL DATA:  Postoperative right hip intramedullary nail placement EXAM: PORTABLE PELVIS 1-2 VIEWS COMPARISON:  Same day radiographs FINDINGS: Post open reduction internal fixation of the comminuted intertrochanteric right femur fracture with extension into the proximal femoral shaft. There is been placement of an intramedullary nail and transcervical cancellous screw. Alignment across the transfixed fracture line is near anatomic. Displaced fracture fragment involving the lesser trochanter is in similar positioning to preoperative management. Catheter projects over the midline pelvis. Mild degenerative changes are noted in both hips. Expected postsurgical soft  tissue changes are noted about the right hip with overlying swelling and small amount of intra-articular gas. IMPRESSION: 1. Post open reduction internal fixation of the comminuted intertrochanteric right femur fracture without evidence of hardware complication. 2. Displaced fracture fragment involving the lesser trochanter is in similar positioning to preoperative management. Electronically Signed   By: Lovena Le M.D.   On: 09/24/2019 23:04   Dg C-arm 1-60 Min  Result Date: 09/24/2019 CLINICAL DATA:  Proximal right femur fracture. EXAM: RIGHT FEMUR 2 VIEWS; DG C-ARM 1-60 MIN COMPARISON:  Radiographs dated 09/23/2019 FINDINGS: Multiple C-arm images demonstrate the patient undergoing open reduction and internal  fixation of the comminuted fracture of the proximal right femoral shaft and lesser trochanter. Intramedullary nail and lag screw have been inserted. Major fragments are near anatomic in alignment and position. Lesser trochanter remains avulsed and displaced. IMPRESSION: Open reduction and internal fixation of comminuted fracture of the proximal right femur. Electronically Signed   By: Lorriane Shire M.D.   On: 09/24/2019 21:05   Dg Hip Unilat W Or Wo Pelvis 2-3 Views Right  Result Date: 09/23/2019 CLINICAL DATA:  Right hip pain after fall. EXAM: DG HIP (WITH OR WITHOUT PELVIS) 2-3V RIGHT COMPARISON:  None. FINDINGS: Severely displaced and comminuted fracture is seen involving the intertrochanteric region of the proximal right femur. Vascular calcifications are noted. Right femoral head remains within the acetabulum. IMPRESSION: Severely displaced and comminuted intertrochanteric fracture of proximal right femur. Electronically Signed   By: Marijo Conception M.D.   On: 09/23/2019 15:53   Dg Femur, Min 2 Views Right  Result Date: 09/24/2019 CLINICAL DATA:  Proximal right femur fracture. EXAM: RIGHT FEMUR 2 VIEWS; DG C-ARM 1-60 MIN COMPARISON:  Radiographs dated 09/23/2019 FINDINGS: Multiple C-arm images demonstrate the patient undergoing open reduction and internal fixation of the comminuted fracture of the proximal right femoral shaft and lesser trochanter. Intramedullary nail and lag screw have been inserted. Major fragments are near anatomic in alignment and position. Lesser trochanter remains avulsed and displaced. IMPRESSION: Open reduction and internal fixation of comminuted fracture of the proximal right femur. Electronically Signed   By: Lorriane Shire M.D.   On: 09/24/2019 21:05   Dg Femur Port, Min 2 Views Right  Result Date: 09/24/2019 CLINICAL DATA:  Postop, right hip intramedullary nail placement EXAM: RIGHT FEMUR PORTABLE 2 VIEW COMPARISON:  Same day radiographs FINDINGS: Patient is post  intramedullary nail and transcervical cancellous screw placement transfixing the previously described intertrochanteric right femur fracture which extends into the proximal femoral diaphysis. Hardware is secured distally by 2 fully threaded, transversely oriented fixation screws in the distal diaphysis. A displaced fracture fragment involving the lesser trochanters in similar positioning to the preoperative radiographs. Femoral head remains normally located. There is diffuse soft tissue swelling of the right lower extremity in expected postoperative soft tissue gas and intra-articular gas. Catheter tubing overlies the right upper leg. Vascular calcium noted in the medial thigh. Alignment at the knee is grossly preserved though incompletely evaluated on non dedicated exam. IMPRESSION: 1. Post intramedullary nail and transcervical right femur fracture fixation without evidence of hardware complication. 2. Displaced fracture fragment involving the lesser trochanters in similar positioning to the preoperative radiographs. Electronically Signed   By: Lovena Le M.D.   On: 09/24/2019 23:06      Subjective: Seen and examined this morning doing well no new complaints.  Leg edema much better.  Discharge Exam: Vitals:   09/27/19 0608 09/27/19 0810  BP: (!) 126/53 (!) 154/95  Pulse: (!) 56 60  Resp: 18 18  Temp: 97.7 F (36.5 C) 97.8 F (36.6 C)  SpO2: 98% 98%   Vitals:   09/27/19 0330 09/27/19 0358 09/27/19 0608 09/27/19 0810  BP: (!) 144/58 (!) 113/100 (!) 126/53 (!) 154/95  Pulse: 60 63 (!) 56 60  Resp: 18  18 18   Temp: 98.3 F (36.8 C) 98.4 F (36.9 C) 97.7 F (36.5 C) 97.8 F (36.6 C)  TempSrc: Oral Oral Oral Oral  SpO2: 95%  98% 98%  Weight:      Height:        General: Pt is alert, awake, not in acute distress Cardiovascular: RRR, S1/S2 +, no rubs, no gallops Respiratory: CTA bilaterally, no wheezing, no rhonchi Abdominal: Soft, NT, ND, bowel sounds + Extremities: Chronic  appearing  edema, no cyanosis    The results of significant diagnostics from this hospitalization (including imaging, microbiology, ancillary and laboratory) are listed below for reference.     Microbiology: Recent Results (from the past 240 hour(s))  SARS CORONAVIRUS 2 (TAT 6-24 HRS) Nasopharyngeal Nasopharyngeal Swab     Status: None   Collection Time: 09/23/19  4:58 PM   Specimen: Nasopharyngeal Swab  Result Value Ref Range Status   SARS Coronavirus 2 NEGATIVE NEGATIVE Final    Comment: (NOTE) SARS-CoV-2 target nucleic acids are NOT DETECTED. The SARS-CoV-2 RNA is generally detectable in upper and lower respiratory specimens during the acute phase of infection. Negative results do not preclude SARS-CoV-2 infection, do not rule out co-infections with other pathogens, and should not be used as the sole basis for treatment or other patient management decisions. Negative results must be combined with clinical observations, patient history, and epidemiological information. The expected result is Negative. Fact Sheet for Patients: SugarRoll.be Fact Sheet for Healthcare Providers: https://www.woods-mathews.com/ This test is not yet approved or cleared by the [place] FDA and  has been authorized for detection and/or diagnosis of SARS-CoV-2 by FDA under an Emergency Use Authorization (EUA). This EUA will remain  in effect (meaning this test can be used) for the duration of the COVID-19 declaration under Section 56 4(b)(1) of the Act, 21 U.S.C. section 360bbb-3(b)(1), unless the authorization is terminated or revoked sooner. Performed at [facility], [place] [address] [place], [place] [address]   MRSA PCR Screening     Status: Abnormal   Collection Time: 09/24/19  2:16 AM   Specimen: Nasal Mucosa; Nasopharyngeal  Result Value Ref Range Status   MRSA by PCR POSITIVE (A) NEGATIVE Final    Comment:        The GeneXpert MRSA Assay  (FDA approved for NASAL specimens only), is one component of a comprehensive MRSA colonization surveillance program. It is not intended to diagnose MRSA infection nor to guide or monitor treatment for MRSA infections. RESULT CALLED TO, READ BACK BY AND VERIFIED WITH: WORTH,S RN [phone] 09/24/2019 MITCHELL,L Performed at [facility], [place] [address], [place], [place] [address]      Labs: BNP (last 3 results) Recent Labs    05/07/19 1400  BNP 470.9*   Basic Metabolic Panel: Recent Labs  Lab 09/23/19 1307 09/24/19 0254 09/25/19 0334 09/26/19 0654 09/27/19 0822  NA 141 142 145 142 139  K 3.8 4.6 4.2 3.7 3.9  CL 104 105 108 108 104  CO2 28 26 26 26 25   GLUCOSE 118* 118* 145* 107* 106*  BUN 21 23 25* 32* 31*  CREATININE 1.49* 1.60* 1.58* 1.61* 1.48*  CALCIUM 8.6*  8.5* 8.4* 8.1*  8.0* 8.1*   Liver Function Tests: Recent Labs  Lab 09/24/19 0254 09/25/19 0334  AST 21 20  ALT 13 12  ALKPHOS 74 61  BILITOT 1.2 0.3  PROT 5.3* 4.8*  ALBUMIN 2.8* 2.3*   No results for input(s): LIPASE, AMYLASE in the last 168 hours. No results for input(s): AMMONIA in the last 168 hours. CBC: Recent Labs  Lab 09/23/19 1307 09/24/19 0254 09/25/19 0334 09/26/19 0654 09/27/19 0822  WBC 8.9 7.6 7.7 7.2  --   NEUTROABS 7.1 6.1 6.4  --   --   HGB 11.4* 10.5* 9.2* 7.4* 10.9*  HCT 36.3 31.8* 28.1* 23.2* 33.5*  MCV 100.0 98.8 98.9 100.4*  --   PLT 226 197 192 205  --    Cardiac Enzymes: No results for input(s): CKTOTAL, CKMB, CKMBINDEX, TROPONINI in the last 168 hours. BNP: Invalid input(s): POCBNP CBG: No results for input(s): GLUCAP in the last 168 hours. D-Dimer No results for input(s): DDIMER in the last 72 hours. Hgb A1c No results for input(s): HGBA1C in the last 72 hours. Lipid Profile No results for input(s): CHOL, HDL, LDLCALC, TRIG, CHOLHDL, LDLDIRECT in the last 72 hours. Thyroid function studies Recent Labs    09/26/19 0654  TSH 5.394*   Anemia work  up No results for input(s): VITAMINB12, FOLATE, FERRITIN, TIBC, IRON, RETICCTPCT in the last 72 hours. Urinalysis    Component Value Date/Time   COLORURINE YELLOW 06/19/2014 2131   APPEARANCEUR CLOUDY (A) 06/19/2014 2131   LABSPEC 1.035 (H) 06/19/2014 2131   PHURINE 6.0 06/19/2014 2131   GLUCOSEU NEGATIVE 06/19/2014 2131   HGBUR TRACE (A) 06/19/2014 2131   BILIRUBINUR NEGATIVE 06/19/2014 2131   KETONESUR NEGATIVE 06/19/2014 2131   PROTEINUR NEGATIVE 06/19/2014 2131   UROBILINOGEN 0.2 06/19/2014 2131   NITRITE NEGATIVE 06/19/2014 2131   LEUKOCYTESUR LARGE (A) 06/19/2014 2131   Sepsis Labs Invalid input(s): PROCALCITONIN,  WBC,  LACTICIDVEN Microbiology Recent Results (from the past 240 hour(s))  SARS CORONAVIRUS 2 (TAT 6-24 HRS) Nasopharyngeal Nasopharyngeal Swab     Status: None   Collection Time: 09/23/19  4:58 PM   Specimen: Nasopharyngeal Swab  Result Value Ref Range Status   SARS Coronavirus 2 NEGATIVE NEGATIVE Final    Comment: (NOTE) SARS-CoV-2 target nucleic acids are NOT DETECTED. The SARS-CoV-2 RNA is generally detectable in upper and lower respiratory specimens during the acute phase of infection. Negative results do not preclude SARS-CoV-2 infection, do not rule out co-infections with other pathogens, and should not be used as the sole basis for treatment or other patient management decisions. Negative results must be combined with clinical observations, patient history, and epidemiological information. The expected result is Negative. Fact Sheet for Patients: SugarRoll.be Fact Sheet for Healthcare Providers: https://www.woods-mathews.com/ This test is not yet approved or cleared by the [place] FDA and  has been authorized for detection and/or diagnosis of SARS-CoV-2 by FDA under an Emergency Use Authorization (EUA). This EUA will remain  in effect (meaning this test can be used) for the duration of the COVID-19  declaration under Section 56 4(b)(1) of the Act, 21 U.S.C. section 360bbb-3(b)(1), unless the authorization is terminated or revoked sooner. Performed at [facility], [place] [address], [place], [place] [address]   MRSA PCR Screening     Status: Abnormal   Collection Time: 09/24/19  2:16 AM   Specimen: Nasal Mucosa; Nasopharyngeal  Result Value Ref Range Status   MRSA by PCR POSITIVE (A) NEGATIVE Final  Comment:        The GeneXpert MRSA Assay (FDA approved for NASAL specimens only), is one component of a comprehensive MRSA colonization surveillance program. It is not intended to diagnose MRSA infection nor to guide or monitor treatment for MRSA infections. RESULT CALLED TO, READ BACK BY AND VERIFIED WITH: WORTH,S RN [phone] 09/24/2019 MITCHELL,L Performed at [facility], [place] [address] [place], [place] [address]      Time coordinating discharge: 35 minutes  SIGNED:   Antonieta Pert, MD  [facility] 09/27/2019, 2:49 PM  If 7PM-7AM, please contact night-coverage [email or web address]

## 2019-09-27 NOTE — NC FL2 (Signed)
[place] LEVEL OF CARE SCREENING TOOL     IDENTIFICATION  Patient Name: Christina Morgan Birthdate: August 08, 1933 Sex: female Admission Date (Current Location): 09/23/2019  [facility] and [place] Number:  Herbalist and Address:  [facility] [place], [place] [address], [place], [place] [address]      Provider Number: [id number]  Attending Physician Name and Address:  Antonieta Pert, MD  Relative Name and Phone Number:  Summar Mcglothlin [phone]    Current Level of Care: Hospital Recommended Level of Care: [place] Prior Approval Number:    Date Approved/Denied:   PASRR Number: [id number]  Discharge Plan: SNF    Current Diagnoses: Patient Active Problem List   Diagnosis Date Noted  . PAF (paroxysmal atrial fibrillation) ([place])   . Vitamin D deficiency 09/25/2019  . Closed displaced intertrochanteric fracture of right femur ([place])   . Compression of lumbar vertebra ([place])   . Compression fracture of thoracic vertebra (HCC)   . Fall 09/23/2019  . SOB (shortness of breath) 01/04/2019  . Bilateral lower leg cellulitis 03/30/2018  . Chronic diastolic heart failure ([place]) 03/30/2018  . Edema of extremities 08/19/2014  . Essential hypertension, benign 05/15/2014  . Mixed hyperlipidemia 05/15/2014  . Persistent atrial fibrillation ([place]) 05/15/2014  . Endometrial ca ([place]) 12/28/2012    Orientation RESPIRATION BLADDER Height & Weight     Self, Time, Situation, Place  Normal Continent, External catheter Weight: 80.9 kg Height:  5\' 6"  (167.6 cm)  BEHAVIORAL SYMPTOMS/MOOD NEUROLOGICAL BOWEL NUTRITION STATUS  Other (Comment)(N/A) (N/A) Continent Diet(Heart Healthy)  AMBULATORY STATUS COMMUNICATION OF NEEDS Skin   Extensive Assist Verbally Surgical wounds(IM nail right LE)                       Personal Care Assistance Level of Assistance  Bathing, Feeding, Dressing Bathing Assistance: Maximum assistance Feeding  assistance: Limited assistance Dressing Assistance: Maximum assistance     Functional Limitations Info  Sight, Hearing, Speech Sight Info: Impaired Hearing Info: Adequate Speech Info: Adequate    SPECIAL CARE FACTORS FREQUENCY  PT (By licensed PT), OT (By licensed OT)     PT Frequency: Min 4X/week OT Frequency: Min 2X/week            Contractures Contractures Info: Not present    Additional Factors Info  Code Status, Allergies Code Status Info: Full Code Allergies Info: Penicillins           Current Medications (09/27/2019):  This is the current hospital active medication list Current Facility-Administered Medications  Medication Dose Route Frequency Provider Last Rate Last Dose  . 0.9 %  sodium chloride infusion (Manually program via Guardrails IV Fluids)   Intravenous Once Ainsley Spinner, PA-C      . acetaminophen (TYLENOL) tablet 325-650 mg  325-650 mg Oral Q6H PRN Ainsley Spinner, PA-C      . acetaminophen (TYLENOL) tablet 500 mg  500 mg Oral Q8H Ainsley Spinner, PA-C   500 mg at 09/27/19 4132  . apixaban (ELIQUIS) tablet 2.5 mg  2.5 mg Oral BID Ainsley Spinner, PA-C   2.5 mg at 09/27/19 1007  . atenolol (TENORMIN) tablet 12.5 mg  12.5 mg Oral BID Georgette Shell, MD   12.5 mg at 09/27/19 1007  . atorvastatin (LIPITOR) tablet 10 mg  10 mg Oral QHS Ainsley Spinner, PA-C   10 mg at 09/26/19 2128  . cholecalciferol (VITAMIN D3) tablet 2,000 Units  2,000 Units Oral BID Ainsley Spinner,  PA-C   2,000 Units at 09/27/19 1007  . docusate sodium (COLACE) capsule 100 mg  100 mg Oral BID Ainsley Spinner, PA-C   100 mg at 09/27/19 1007  . feeding supplement (ENSURE ENLIVE) (ENSURE ENLIVE) liquid 237 mL  237 mL Oral Q1500 Georgette Shell, MD   237 mL at 09/26/19 1431  . flecainide (TAMBOCOR) tablet 100 mg  100 mg Oral BID Ainsley Spinner, PA-C   100 mg at 09/27/19 1007  . hydrALAZINE (APRESOLINE) tablet 12.5 mg  12.5 mg Oral BID Troy Sine, MD      . HYDROcodone-acetaminophen (NORCO/VICODIN)  5-325 MG per tablet 1-2 tablet  1-2 tablet Oral Q6H PRN Ainsley Spinner, PA-C   1 tablet at 09/27/19 1109  . menthol-cetylpyridinium (CEPACOL) lozenge 3 mg  1 lozenge Oral PRN Ainsley Spinner, PA-C       Or  . phenol (CHLORASEPTIC) mouth spray 1 spray  1 spray Mouth/Throat PRN Ainsley Spinner, PA-C      . metoCLOPramide (REGLAN) tablet 5-10 mg  5-10 mg Oral Q8H PRN Ainsley Spinner, PA-C       Or  . metoCLOPramide (REGLAN) injection 5-10 mg  5-10 mg Intravenous Q8H PRN Ainsley Spinner, PA-C      . morphine 2 MG/ML injection 2 mg  2 mg Intravenous Q3H PRN Ainsley Spinner, PA-C   2 mg at 09/25/19 9735  . mupirocin ointment (BACTROBAN) 2 % 1 application  1 application Nasal BID Ainsley Spinner, PA-C   1 application at 32/99/24 1110  . ondansetron (ZOFRAN) tablet 4 mg  4 mg Oral Q6H PRN Ainsley Spinner, PA-C       Or  . ondansetron [facility]) injection 4 mg  4 mg Intravenous Q6H PRN Ainsley Spinner, PA-C      . polyethylene glycol (MIRALAX / GLYCOLAX) packet 17 g  17 g Oral Daily PRN Ainsley Spinner, PA-C      . senna-docusate (Senokot-S) tablet 2 tablet  2 tablet Oral QHS Georgette Shell, MD   2 tablet at 09/26/19 2128  . vitamin C (ASCORBIC ACID) tablet 500 mg  500 mg Oral Daily Ainsley Spinner, PA-C   500 mg at 09/27/19 1007  . Vitamin D (Ergocalciferol) (DRISDOL) capsule 50,000 Units  50,000 Units Oral Q7 days Ainsley Spinner, PA-C   50,000 Units at 09/25/19 1502     Discharge Medications: Please see discharge summary for a list of discharge medications.  Relevant Imaging Results:  Relevant Lab Results:   Additional Information SSN: [id number]  [place], BSN, NCM-BC, ACM-RN [phone]

## 2019-09-28 ENCOUNTER — Inpatient Hospital Stay (HOSPITAL_COMMUNITY): Payer: Medicare Other | Admitting: Physical Therapy

## 2019-09-28 ENCOUNTER — Inpatient Hospital Stay (HOSPITAL_COMMUNITY): Payer: Medicare Other

## 2019-09-28 DIAGNOSIS — D62 Acute posthemorrhagic anemia: Secondary | ICD-10-CM

## 2019-09-28 DIAGNOSIS — N183 Chronic kidney disease, stage 3 unspecified: Secondary | ICD-10-CM

## 2019-09-28 DIAGNOSIS — I10 Essential (primary) hypertension: Secondary | ICD-10-CM

## 2019-09-28 DIAGNOSIS — N1832 Chronic kidney disease, stage 3b: Secondary | ICD-10-CM

## 2019-09-28 DIAGNOSIS — I5032 Chronic diastolic (congestive) heart failure: Secondary | ICD-10-CM

## 2019-09-28 LAB — BPAM RBC
Blood Product Expiration Date: 202011062359
Blood Product Expiration Date: 202011062359
ISSUE DATE / TIME: 202010152354
ISSUE DATE / TIME: 202010160312
Unit Type and Rh: 6200
Unit Type and Rh: 6200

## 2019-09-28 LAB — TYPE AND SCREEN
ABO/RH(D): A POS
Antibody Screen: NEGATIVE
Unit division: 0
Unit division: 0

## 2019-09-28 NOTE — Progress Notes (Signed)
During medication administration, patient stated that she needed to go to the bathroom. By the time the nurse got to the door to put on gloves, patient stated that she couldn't hold it & urinated in her brief. When hygiene care given, noticed that her dressing to the right hip was saturated with urine. It was removed & 2 foam dressings were applied. She has 3 surgical incisions. The other dressing was covering 2 sites with sutures & there is another site that has an intact dressing to the lower lateral right leg. She c/o pain to the right groin only when turned. She also has blanchable redness to bil heels. Hells were floated on a pillow. She had on thigh high teds & they were very tight. There were a few crease marks on the skin & the right leg is visibly larger than the left. Movement in the leg is very weak & staff has to hold it for her to turn. Sacral drsg is in place & there is no breakdown noted under the dressing. After the procedure was completed, no acute distress noted. Will continue to monitor for changes.

## 2019-09-28 NOTE — Evaluation (Signed)
Occupational Therapy Assessment and Plan  Patient Details  Name: Christina Morgan MRN: [id number] Date of Birth: 05-31-1933  OT Diagnosis: abnormal posture, acute pain, muscle weakness (generalized), pain in joint and swelling of limb Rehab Potential:   ELOS: 3-4 weeks   Today's Date: 09/28/2019 OT Individual Time: 1000-1100 OT Individual Time Calculation (min): 60 min     Problem List:  Patient Active Problem List   Diagnosis Date Noted  . Essential hypertension   . Chronic diastolic congestive heart failure ([place])   . Stage 3 chronic kidney disease   . Acute blood loss anemia   . Closed subtrochanteric fracture of hip, right, sequela 09/27/2019  . Traumatic closed trochanteric fracture of femur with minimal displacement, left, with delayed healing, subsequent encounter 09/27/2019  . PAF (paroxysmal atrial fibrillation) ([place])   . Vitamin D deficiency 09/25/2019  . Closed displaced intertrochanteric fracture of right femur ([place])   . Compression of lumbar vertebra ([place])   . Compression fracture of thoracic vertebra (HCC)   . Fall 09/23/2019  . SOB (shortness of breath) 01/04/2019  . Bilateral lower leg cellulitis 03/30/2018  . Chronic diastolic heart failure ([place]) 03/30/2018  . Edema of extremities 08/19/2014  . Essential hypertension, benign 05/15/2014  . Mixed hyperlipidemia 05/15/2014  . Persistent atrial fibrillation ([place]) 05/15/2014  . Endometrial ca [facility]) 12/28/2012    Past Medical History:  Past Medical History:  Diagnosis Date  . Cancer [facility])    endometrial ca  . Chronic diastolic CHF (congestive heart failure) ([place])   . Chronic edema   . CKD (chronic kidney disease), stage III   . Dyslipidemia   . Fracture of left humerus   . History of cardiovascular stress test    Lexiscan Myoview (06/2014): No ischemia or scar, not gated, low risk  . Hx of echocardiogram    Echo (05/02/14): EF 60% to 65%. No regional wall motion abnormalities. Mild AI, mildly dilated  aortic root (37 mm), trivial MR, trivial TR  . Hyperlipidemia   . Hypertension   . Lipoma of skin    multiple  . Osteopenia   . PAF (paroxysmal atrial fibrillation) ([place])    failed DCCV 05/2014 >> Flecainide started >> DCCV 7/15 sucessful;  f/u ETT neg for pro-arrhythmia >> recurrent AF/AFL >> Flecainide inc to 100 bid with repeat DCCV 9/15  . Vitamin D deficiency 09/25/2019   Past Surgical History:  Past Surgical History:  Procedure Laterality Date  . CARDIOVERSION N/A 06/05/2014   Procedure: CARDIOVERSION;  Surgeon: Sueanne Margarita, MD;  Location: [place];  Service: Cardiovascular;  Laterality: N/A;  . CARDIOVERSION N/A 07/11/2014   Procedure: CARDIOVERSION;  Surgeon: Sueanne Margarita, MD;  Location: [facility] ENDOSCOPY;  Service: Cardiovascular;  Laterality: N/A;  . CARDIOVERSION N/A 09/04/2014   Procedure: CARDIOVERSION;  Surgeon: Sueanne Margarita, MD;  Location: [facility] ENDOSCOPY;  Service: Cardiovascular;  Laterality: N/A;  . COLONOSCOPY  2008  . COLONOSCOPY N/A 05/20/2015   Procedure: COLONOSCOPY;  Surgeon: Ronald Lobo, MD;  Location: [facility] ENDOSCOPY;  Service: Endoscopy;  Laterality: N/A;  . FEMUR IM NAIL Right 09/24/2019   Procedure: INTRAMEDULLARY (IM) NAIL FEMORAL;  Surgeon: Altamese Hemingford, MD;  Location: [place];  Service: Orthopedics;  Laterality: Right;  . IR KYPHO EA ADDL LEVEL THORACIC OR LUMBAR  04/12/2019  . IR KYPHO THORACIC WITH BONE BIOPSY  04/12/2019  . ROBOTIC ASSISTED SUPRACERVICAL HYSTERECTOMY WITH BILATERAL SALPINGO OOPHERECTOMY  11/06/2012   and bilateral pelvic lymph node dissection    Assessment & Plan  Clinical Impression: Christina Morgan is an 85 year old right-handed female with history of chronic diastolic congestive heart failure,endometrial cancer followed by Dr. Polly Cobia in [place] maintained on Megace as well as Nolvadex,chronic back pain with kyphoplasty May 2020, CKD stage III, lymphedema, hypertension, PAF maintained on Eliquis as well as Tambocor. Per  chart review patient lives alone. 1 level home 4 steps to entry. Modified independent with a walkerhowever reportedly sedentary and has had other falls in the past. She has an aide 2 times a week to assist with some ADLs. She does have a daughter in the area who checks on her routinely. Presented 09/23/2019 after reported fall without loss of consciousness. Denies any chest pain or shortness of breath related to fall. Noted BUN 21 creatinine 1.49 hemoglobin 11.4, COVID negative. Echocardiogram 09/24/2019 showed ejection fraction of 70%. X-rays and imaging revealed right subtrochanteric hip fracture. Underwent IM nailing with ORIF 09/24/2019 per Dr. Marcelino Scot. Hospital course pain management weightbearing as tolerated right lower extremity. Chronic Eliquis has been resumed postoperatively. Acute blood loss anemia 7.4 and transfused 2 units packed red blood cells latest hemoglobin 10.9.   Patient currently requires max with basic self-care skills secondary to muscle weakness, decreased cardiorespiratoy endurance and decreased sitting balance, decreased standing balance, decreased postural control, decreased balance strategies and difficulty maintaining precautions.  Prior to hospitalization, patient could complete BADL/IADL with S- modified independent .  Patient will benefit from skilled intervention to decrease level of assist with basic self-care skills and increase independence with basic self-care skills prior to discharge home with care partner.  Anticipate patient will require 24 hour supervision and minimal physical assistance and follow up home health.  OT - End of Session Activity Tolerance: Tolerates 10 - 20 min activity with multiple rests Endurance Deficit: Yes OT Assessment Rehab Potential (ACUTE ONLY): Good OT Barriers to Discharge: Inaccessible home environment;Decreased caregiver support;Home environment access/layout;Lack of/limited family support;Weight;Weight bearing  restrictions OT Patient demonstrates impairments in the following area(s): Balance;Endurance;Motor;Pain;Safety;Sensory;Skin Integrity OT Basic ADL's Functional Problem(s): Grooming;Bathing;Dressing;Toileting OT Transfers Functional Problem(s): Toilet;Tub/Shower OT Additional Impairment(s): Fuctional Use of Upper Extremity OT Plan OT Intensity: Minimum of 1-2 x/day, 45 to 90 minutes OT Frequency: 5 out of 7 days OT Duration/Estimated Length of Stay: 3-4 weeks OT Treatment/Interventions: Balance/vestibular training;Discharge planning;Pain management;Self Care/advanced ADL retraining;Therapeutic Activities;UE/LE Coordination activities;Therapeutic Exercise;Skin care/wound managment;Patient/family education;Functional mobility training;Disease mangement/prevention;Community reintegration;DME/adaptive equipment instruction;Neuromuscular re-education;Splinting/orthotics;Psychosocial support;UE/LE Strength taining/ROM;Wheelchair propulsion/positioning OT Self Feeding Anticipated Outcome(s): no goal OT Basic Self-Care Anticipated Outcome(s): S grooming/UB dressing; MIN A B dressing OT Toileting Anticipated Outcome(s): MIN A OT Bathroom Transfers Anticipated Outcome(s): MIN A OT Recommendation Patient destination: Home Follow Up Recommendations: Home health OT Equipment Recommended: Tub/shower bench   Skilled Therapeutic Intervention 1;1. Pt received in bed agreeable to OT and denying pain at rest. Present but does not rate during mobility. Pt supine<>sitting EOB with MAX A-+2 A. Pt requires min-mod A for sitting balance at EOB d/t posterior pelvic tilt and weak abdominals with delayed righting reaction. Pt bathes with A for buttocks, B feet and peri area in standing. Pt dons shirt with S, pants with MAX A at sit to stand level in stedy. Pt requires significatly elevated surface and MOD A to stand in stedy. Perched on stedy flaps with S pt able to groom and maintain balance with forearms resting on stedy  bar. Exited session with pt seated in bed,exit alarm on and call light in reach  OT Evaluation Precautions/Restrictions  Precautions Precautions: Fall Restrictions Weight Bearing Restrictions: Yes RLE Weight  Bearing: Weight bearing as tolerated General Chart Reviewed: Yes Family/Caregiver Present: No Vital Signs  Pain Pain Assessment Pain Score: 0-No pain Home Living/Prior Functioning Home Living Family/patient expects to be discharged to:: Private residence Living Arrangements: Alone Available Help at Discharge: Available PRN/intermittently(daughter does not work, but cannot stay 24/7) Type of Home: House Home Access: Stairs to enter Technical brewer of Steps: 4 Entrance Stairs-Rails: Right, Left, Can reach both Home Layout: One level Bathroom Shower/Tub: Chiropodist: Standard  Lives With: Alone IADL History Current License: Yes Mode of Transportation: Family Occupation: Retired Leisure and Hobbies: used to love to sew Prior Function Level of Independence: Independent with gait, Independent with transfers, Requires assistive device for independence  Able to Take Stairs?: Yes Driving: No Comments: one friend that assists with bathing 2x/week and an aide that comes to assess her vitals weekly; reports her daughter comes over 4-5hrs/day and does a lot around her house as far as laundry/cleaning ADL   Vision Baseline Vision/History: Wears glasses Wears Glasses: At all times Patient Visual Report: No change from baseline Vision Assessment?: No apparent visual deficits Perception  Perception: Within Functional Limits Praxis Praxis: Intact Cognition Overall Cognitive Status: Within Functional Limits for tasks assessed Arousal/Alertness: Lethargic Orientation Level: Place;Person;Situation Person: Oriented Place: Oriented Situation: Oriented Year: 2020 Month: October Day of Week: Correct Memory: Appears intact Immediate Memory Recall:  Sock;Blue;Bed Memory Recall Sock: Not able to recall Memory Recall Blue: Without Cue Memory Recall Bed: Not able to recall Attention: Focused;Sustained Focused Attention: Appears intact Sustained Attention: Appears intact Safety/Judgment: Appears intact Sensation Sensation Light Touch: Appears Intact Hot/Cold: Not tested Proprioception: Appears Intact Stereognosis: Not tested Coordination Gross Motor Movements are Fluid and Coordinated: No Fine Motor Movements are Fluid and Coordinated: No Coordination and Movement Description: impaired due to R LE weakness and pain as well as overall generalized weakness and deconditioning Finger Nose Finger Test: WNL Heel Shin Test: Unable to perform with R LE due to pain Motor  Motor Motor: Abnormal postural alignment and control Mobility  Bed Mobility Bed Mobility: Supine to Sit;Sit to Supine Right Sidelying to Sit: Total Assistance - Patient < 25% Supine to Sit: Maximal Assistance - Patient - Patient 25-49% Sit to Supine: Maximal Assistance - Patient 25-49% Transfers Sit to Stand: Moderate Assistance - Patient 50-74%(from elevated surface) Stand to Sit: Moderate Assistance - Patient 50-74%(from elevated surface)  Trunk/Postural Assessment  Cervical Assessment Cervical Assessment: Exceptions to WFL(head forward) Thoracic Assessment Thoracic Assessment: Exceptions to WFL(rounde dhsoulders) Lumbar Assessment Lumbar Assessment: Exceptions to WFL(posterior pelvic tilt) Postural Control Postural Control: Deficits on evaluation(pain limiting movement/reactions)  Balance Balance Balance Assessed: Yes Static Sitting Balance Static Sitting - Balance Support: Feet supported;No upper extremity supported;Bilateral upper extremity supported Static Sitting - Level of Assistance: 4: Min assist;3: Mod assist(EOB) Static Standing Balance Static Standing - Level of Assistance: 3: Mod assist Static Standing - Comment/# of Minutes: in  stedy Extremity/Trunk Assessment RUE Assessment General Strength Comments: generalized weakness LUE Assessment General Strength Comments: generalized weakness     Refer to Care Plan for Long Term Goals  Recommendations for other services: Therapeutic Recreation  Pet therapy and Stress management   Discharge Criteria: Patient will be discharged from OT if patient refuses treatment 3 consecutive times without medical reason, if treatment goals not met, if there is a change in medical status, if patient makes no progress towards goals or if patient is discharged from hospital.  The above assessment, treatment plan, treatment alternatives and goals were discussed and  mutually agreed upon: by patient  Tonny Branch 09/28/2019, 10:16 AM

## 2019-09-28 NOTE — Progress Notes (Signed)
[place] PHYSICAL MEDICINE & REHABILITATION PROGRESS NOTE  Subjective/Complaints: Patient seen laying in bed this morning, about to work with therapies.  She states she slept well overnight.  She states she is ready to begin therapies.  ROS: Denies CP, SOB, N/V/D  Objective: Vital Signs: Blood pressure (!) 158/59, pulse 67, temperature 98.7 F (37.1 C), temperature source Oral, resp. rate 18, weight 86 kg, SpO2 95 %. No results found. Recent Labs    09/26/19 0654 09/27/19 0822  WBC 7.2  --   HGB 7.4* 10.9*  HCT 23.2* 33.5*  PLT 205  --    Recent Labs    09/26/19 0654 09/27/19 0822  NA 142 139  K 3.7 3.9  CL 108 104  CO2 26 25  GLUCOSE 107* 106*  BUN 32* 31*  CREATININE 1.61* 1.48*  CALCIUM 8.1*  8.0* 8.1*    Physical Exam: BP (!) 158/59 (BP Location: Right Arm)   Pulse 67   Temp 98.7 F (37.1 C) (Oral)   Resp 18   Wt 86 kg   SpO2 95%   BMI 30.60 kg/m  Constitutional: No distress . Vital signs reviewed. HENT: Normocephalic.  Atraumatic. Eyes: EOMI. No discharge. Cardiovascular: No JVD. Respiratory: Normal effort.  No stridor. GI: Non-distended. Skin: Scattered bruising Right hip with dressing C/D/I Psych: Normal mood.  Normal behavior. Musc: Bilateral, right greater than left lower extremity edema  Neurological: Alert She follows commands.  Motor: Bilateral upper extremities: 5/5 proximal distal Left lower extremity: Hip flexion, knee extension 4 -/5, ankle dorsiflexion 4/5 Right lower extremity: Hip flexion, knee extension to -/5, ankle dorsiflexion 4 -/5   Assessment/Plan: 1. Functional deficits secondary to right subtrochanteric femur fracture which require 3+ hours per day of interdisciplinary therapy in a comprehensive inpatient rehab setting.  Physiatrist is providing close team supervision and 24 hour management of active medical problems listed below.  Physiatrist and rehab team continue to assess barriers to discharge/monitor patient  progress toward functional and medical goals  Care Tool:  Bathing  Bathing activity did not occur: Safety/medical concerns           Bathing assist       Upper Body Dressing/Undressing Upper body dressing Upper body dressing/undressing activity did not occur (including orthotics): N/A(patient had a gown on)      Upper body assist      Lower Body Dressing/Undressing Lower body dressing    Lower body dressing activity did not occur: N/A(no incontinent brief on)       Lower body assist       Toileting Toileting    Toileting assist Assist for toileting: Total Assistance - Patient < 25%     Transfers Chair/bed transfer  Transfers assist  Chair/bed transfer activity did not occur: Safety/medical concerns        Locomotion Ambulation   Ambulation assist              Walk 10 feet activity   Assist           Walk 50 feet activity   Assist           Walk 150 feet activity   Assist           Walk 10 feet on uneven surface  activity   Assist           Wheelchair     Assist               Wheelchair 50 feet with 2 turns  activity    Assist            Wheelchair 150 feet activity     Assist           Medical Problem List and Plan: 1.Decreased functional mobilitysecondary to right subtrochanteric hip fracture. Status post IM nailing with ORIF 09/24/2019.  Begin CIR evaluations 2. Antithrombotics: -DVT/anticoagulation:Chronic Eliquis -antiplatelet therapy: N/A 3. Pain Management:Hydrocodone as needed 4. Mood:Provide emotional support -antipsychotic agents: N/A 5. Neuropsych: This patientiscapable of making decisions on herown behalf. 6. Skin/Wound Care:Routine skin checks 7. Fluids/Electrolytes/Nutrition:Routine in and outs 8. Acute blood loss anemia.  Hemoglobin 10.9 on 10/16, labs ordered for Monday 9. PAF. Continue Eliquis as well as  Tambocor 100 mg twice daily, Tenormin 12.5 mg twice daily. Cardiac rate controlled 10. Hyperlipidemia. Lipitor 11. Metabolic bone disease. Continue vitamin D supplements as directed 12. CKD stage III. Creatinine baseline 1.49.  Creatinine 1.48 on 10/16, labs ordered for Monday 13. Chronic diastolic congestive heart failure monitor for any signs of fluid overload. Followed by cardiology services.Demadex remains on hold per cardiology services Filed Weights   09/27/19 1926  Weight: 86 kg  14. Lymphedema.She did have a home health nurse to assist in leg wraps when needed -elevate leg in bed/chair -TEDS 15.Endometrial cancer.Patient is followed by Dr. Polly Cobia in [place]. Patient maintained on Megace 80 mg 2 times daily takes for 3 weeks and alternates with Nolvadex20 mg daily for 3 weeks 16.  Essential hypertension  Continue Tenormin 12.5 twice daily  Continue hydralazine 12.5 twice daily  Monitor with increased mobility  LOS: 1 days A FACE TO FACE EVALUATION WAS PERFORMED   Christina Morgan 09/28/2019, 9:12 AM

## 2019-09-28 NOTE — Evaluation (Signed)
Physical Therapy Assessment and Plan  Patient Details  Name: Christina Morgan MRN: [id number] Date of Birth: [birth year]  PT Diagnosis: Abnormal posture, Abnormality of gait, Difficulty walking, Edema, Muscle weakness, Pain in joint and Pain in R LE Rehab Potential: Fair ELOS: 3-3.5 weeks   Today's Date: 09/28/2019 PT Individual Time: 0805-0905 and 1305-1407 PT Individual Time Calculation (min): 60 min  And 62 min  Problem List:  Patient Active Problem List   Diagnosis Date Noted  . Closed subtrochanteric fracture of hip, right, sequela 09/27/2019  . Traumatic closed trochanteric fracture of femur with minimal displacement, left, with delayed healing, subsequent encounter 09/27/2019  . PAF (paroxysmal atrial fibrillation) ([place])   . Vitamin D deficiency 09/25/2019  . Closed displaced intertrochanteric fracture of right femur ([place])   . Compression of lumbar vertebra ([place])   . Compression fracture of thoracic vertebra ([facility])   . Fall 09/23/2019  . SOB (shortness of breath) 01/04/2019  . Bilateral lower leg cellulitis 03/30/2018  . Chronic diastolic heart failure ([place]) 03/30/2018  . Edema of extremities 08/19/2014  . Essential hypertension, benign 05/15/2014  . Mixed hyperlipidemia 05/15/2014  . Persistent atrial fibrillation ([place]) 05/15/2014  . Endometrial ca [facility]) 12/28/2012    Past Medical History:  Past Medical History:  Diagnosis Date  . Cancer [facility])    endometrial ca  . Chronic diastolic CHF (congestive heart failure) ([place])   . Chronic edema   . CKD (chronic kidney disease), stage III   . Dyslipidemia   . Fracture of left humerus   . History of cardiovascular stress test    Lexiscan Myoview (06/2014): No ischemia or scar, not gated, low risk  . Hx of echocardiogram    Echo (05/02/14): EF 60% to 65%. No regional wall motion abnormalities. Mild AI, mildly dilated aortic root (37 mm), trivial MR, trivial TR  . Hyperlipidemia   . Hypertension   . Lipoma of skin     multiple  . Osteopenia   . PAF (paroxysmal atrial fibrillation) ([place])    failed DCCV 05/2014 >> Flecainide started >> DCCV 7/15 sucessful;  f/u ETT neg for pro-arrhythmia >> recurrent AF/AFL >> Flecainide inc to 100 bid with repeat DCCV 9/15  . Vitamin D deficiency 09/25/2019   Past Surgical History:  Past Surgical History:  Procedure Laterality Date  . CARDIOVERSION N/A 06/05/2014   Procedure: CARDIOVERSION;  Surgeon: Sueanne Margarita, MD;  Location: [facility];  Service: Cardiovascular;  Laterality: N/A;  . CARDIOVERSION N/A 07/11/2014   Procedure: CARDIOVERSION;  Surgeon: Sueanne Margarita, MD;  Location: [facility] ENDOSCOPY;  Service: Cardiovascular;  Laterality: N/A;  . CARDIOVERSION N/A 09/04/2014   Procedure: CARDIOVERSION;  Surgeon: Sueanne Margarita, MD;  Location: [facility] ENDOSCOPY;  Service: Cardiovascular;  Laterality: N/A;  . COLONOSCOPY  2008  . COLONOSCOPY N/A 05/20/2015   Procedure: COLONOSCOPY;  Surgeon: Ronald Lobo, MD;  Location: [facility] ENDOSCOPY;  Service: Endoscopy;  Laterality: N/A;  . FEMUR IM NAIL Right 09/24/2019   Procedure: INTRAMEDULLARY (IM) NAIL FEMORAL;  Surgeon: Altamese [place] T[place]akoni, MD;  Location: [place];  Service: Orthopedics;  Laterality: Right;  . IR KYPHO EA ADDL LEVEL THORACIC OR LUMBAR  04/12/2019  . IR KYPHO THORACIC WITH BONE BIOPSY  04/12/2019  . ROBOTIC ASSISTED SUPRACERVICAL HYSTERECTOMY WITH BILATERAL SALPINGO OOPHERECTOMY  11/06/2012   and bilateral pelvic lymph node dissection    Assessment & Plan Clinical Impression: Patient is a 85 y.o. year old female with history of chronic diastolic congestive heart failure,endometrial cancer followed by Dr. Polly Cobia  in [place] maintained on Megace as well as Nolvadex,chronic back pain with kyphoplasty May 2020, CKD stage III, lymphedema, hypertension, PAF maintained on Eliquis as well as Tambocor. Per chart review patient lives alone. 1 level home 4 steps to entry. Modified independent with a walkerhowever reportedly sedentary  and has had other falls in the past. She has an aide 2 times a week to assist with some ADLs. She does have a daughter in the area who checks on her routinely. Presented 09/23/2019 after reported fall without loss of consciousness. Denies any chest pain or shortness of breath related to fall. Noted BUN 21 creatinine 1.49 hemoglobin 11.4, COVID negative. Echocardiogram 09/24/2019 showed ejection fraction of 70%. X-rays and imaging revealed right subtrochanteric hip fracture. Underwent IM nailing with ORIF 09/24/2019 per Dr. Marcelino Scot. Hospital course pain management weightbearing as tolerated right lower extremity. Chronic Eliquis has been resumed postoperatively. Acute blood loss anemia 7.4 and transfused 2 units packed red blood cells latest hemoglobin 10.9. Therapy evaluations completed and patient was admitted for a comprehensive rehab program. Patient transferred to CIR on 09/27/2019 .   Patient currently requires max with mobility secondary to muscle weakness and muscle joint tightness, decreased cardiorespiratoy endurance and decreased sitting balance, decreased standing balance, decreased postural control and decreased balance strategies.  Prior to hospitalization, patient was modified independent  with mobility and lived with Alone in a House home.  Home access is 4Stairs to enter.  Patient will benefit from skilled PT intervention to maximize safe functional mobility, minimize fall risk and decrease caregiver burden for planned discharge home with 24 hour assist.  Anticipate patient will benefit from follow up [facility] at discharge.  PT - End of Session Activity Tolerance: Tolerates 30+ min activity with multiple rests Endurance Deficit: Yes Endurance Deficit Description: pt has labored breathing with activity and requires frequent rest breaks PT Assessment Rehab Potential (ACUTE/IP ONLY): Fair PT Barriers to Discharge: [place] home environment;Decreased caregiver support;Weight bearing  restrictions;Lack of/limited family support;Home environment access/layout PT Patient demonstrates impairments in the following area(s): Balance;Perception;Behavior;Edema;Endurance;Motor;Pain;Skin Integrity;Nutrition;Safety PT Transfers Functional Problem(s): Bed Mobility;Bed to Chair;Car;Furniture PT Locomotion Functional Problem(s): Ambulation;Wheelchair Mobility;Stairs PT Plan PT Intensity: Minimum of 1-2 x/day ,45 to 90 minutes PT Frequency: 5 out of 7 days PT Duration Estimated Length of Stay: 3-3.5 weeks PT Treatment/Interventions: Ambulation/gait training;Community reintegration;DME/adaptive equipment instruction;Neuromuscular re-education;Psychosocial support;Stair training;UE/LE Strength taining/ROM;Wheelchair propulsion/positioning;Balance/vestibular training;Discharge planning;Functional electrical stimulation;Pain management;Skin care/wound management;Therapeutic Activities;UE/LE Coordination activities;Cognitive remediation/compensation;Disease management/prevention;Functional mobility training;Patient/family education;Splinting/orthotics;Therapeutic Exercise;Visual/perceptual remediation/compensation PT Transfers Anticipated Outcome(s): CGA using LRAD PT Locomotion Anticipated Outcome(s): min assist short distances using LRAD PT Recommendation Follow Up Recommendations: Home health PT;24 hour supervision/assistance Patient destination: Home Equipment Recommended: To be determined;Other (comment) Equipment Details: pt has RW  Skilled Therapeutic Intervention Session 1: Evaluation completed (see details above and below) with education on PT POC and goals and individual treatment initiated with focus on bed mobility, sit<>stand transfers, toileting, activity tolerance, and R LE weight bearing as well as education regarding daily therapy schedule, weekly team meetings, purpose of PT evaluation, and other CIR information. Pt received supine in bed with RN arriving shortly after for  medication administration as pt reporting 9/10 R LE pain - therapist provided assist with that LE during functional mobility, repositioning, and rest breaks for pain management throughout session. Therapist donned B LE TED hose max assist. Pt reports she sleeps in a recliner at home. Pt reports need to urinate. Supine>sit, HOB elevated and using bedrails, with max assist for R LE management and trunk upright with  pt requiring significantly increased time to get to EOB and max cuing for sequencing.Upon sitting EOB pt required mod assist for trunk upright while using B UE support due to posterior lean then at end of session pt able to maintain sitting balance EOB with B UE support and CGA for steadying. Sit>stand elevated EOB>stedy with mod assist for lifting. Sit<>stand in stedy with min assist for lifting/lowering. Stedy transfer to elevated BSC. Sit<>stand stedy<>BSC with mod assist for lifting/lowering. Pt able to maintain standing in stedy with B UE support and CGA for steadying while therapist performed total assist LB clothing management and peri-care - pt continent of bladder. Stedy transfer back to EOB. Sit>stand elevated EOB>RW with mod assist for lifting into standing; however, pt demonstrating limited WBing through R LE in standing and not safe to attempt a step at this time. Sit>supine with mod assist for R LE management and heavy use of bedrails with HOB elevated. Mod assist for supine scooting to improve positioning in the bed. Pt left supine in bed with needs in reach and bed alarm on.  Session 2: Pt received supine in bed with her daughter present and pt agreeable to therapy session. Therapist educated pt/daughter regarding CIR weekly meetings, daily therapy schedule, and PT POC and answered daughter's questions. Therapist retrieved w/c and cushion to increase pt's OOB and upright activity tolerance. Pt requesting to use bathroom. Supine>sit, HOB elevated and using bedrails with max assist for R LE  management and trunk upright with mod cuing for sequencing. Sit>stand EOB>stedy with mod assist for lifting and balance with pt having L lateral trunk lean to decrease weight on R LE. Stedy transfer to elevated BSC. Sit<>stand in stedy with min assist. Standing in stedy with B UE support and CGA for steadying progressed to min assist to increase hip extension due to fatigue while therapist performed total assist LB clothing management and peri-care - pt continent of bladder and bowels. Sit>stand elevated BSC>stedy with max assist for lifting into standing due to increasing pt fatigue. Stedy transfer to EOB. Sit<>stand in stedy with mod assist for lifting/lowering. Sit>supine with +2 max assist for trunk descent and B LE management due to increasing pt fatigue. Supine scooting with pt using bedrails and +2 max assist via bed pad. Pt left supine in bed with needs in reach, bed alarm on, and NT present to assume care of patient.  PT Evaluation Precautions/Restrictions Precautions Precautions: Fall Restrictions Weight Bearing Restrictions: Yes RLE Weight Bearing: Weight bearing as tolerated Pain Pain Assessment Pain Scale: 0-10 Pain Score: 9  Pain Type: Acute pain;Surgical pain Pain Location: Hip Pain Orientation: Right Pain Descriptors / Indicators: Aching;Discomfort Pain Onset: On-going Pain Intervention(s): Medication (See eMAR);Rest;Relaxation;Emotional support;Repositioned;RN made aware Multiple Pain Sites: No Home Living/Prior Functioning Home Living Available Help at Discharge: Available PRN/intermittently(family and hired caregivers) Type of Home: House Home Access: Stairs to enter Technical brewer of Steps: 4 Entrance Stairs-Rails: Right;Left;Can reach both Home Layout: One level  Lives With: Alone Prior Function Level of Independence: Independent with gait;Independent with transfers;Requires assistive device for independence(used RW for all standing/ambulatory tasks)  Able  to Take Stairs?: Yes Driving: No Comments: one friend that assists with bathing 2x/week and an aide that comes to assess her vitals weekly; reports her daughter comes over 4-5hrs/day and does a lot around her house as far as laundry/cleaning Perception  Perception Perception: Within Functional Limits Praxis Praxis: Intact  Cognition Overall Cognitive Status: Within Functional Limits for tasks assessed Arousal/Alertness: Awake/alert Orientation Level: Oriented X4  Attention: Focused;Sustained Focused Attention: Appears intact Sustained Attention: Appears intact Safety/Judgment: Appears intact Sensation Sensation Light Touch: Appears Intact Hot/Cold: Not tested Proprioception: Appears Intact Stereognosis: Not tested Coordination Gross Motor Movements are Fluid and Coordinated: No Coordination and Movement Description: impaired due to R LE weakness and pain as well as overall generalized weakness and deconditioning Heel Shin Test: Unable to perform with R LE due to pain Motor  Motor Motor: Abnormal postural alignment and control;Other (comment) Motor - Skilled Clinical Observations: Generalized weakness and deconditioning with more significant R LE strength impairments  Mobility Bed Mobility Bed Mobility: Supine to Sit;Sit to Supine Right Sidelying to Sit: Total Assistance - Patient < 25% Supine to Sit: Maximal Assistance - Patient - Patient 25-49% Sit to Supine: Maximal Assistance - Patient 25-49% Transfers Transfers: Transfer via Geophysicist/field seismologist;Sit to Stand;Stand to Sit Sit to Stand: Moderate Assistance - Patient 50-74%(from elevated surface) Stand to Sit: Moderate Assistance - Patient 50-74%(from elevated surface) Transfer (Assistive device): Other (Comment)(stedy) Transfer via Lift Equipment: Animal nutritionist: No Gait Gait: No Stairs / Additional Locomotion Stairs: No Architect: No  Trunk/Postural Assessment  Cervical  Assessment Cervical Assessment: Exceptions to WFL(forward head) Thoracic Assessment Thoracic Assessment: Exceptions to WFL(thoracic kyphosis) Lumbar Assessment Lumbar Assessment: Exceptions to WFL(posterior pelvic tilt in sitting) Postural Control Postural Control: Deficits on evaluation Trunk Control: pt requiring heavy reliance on B UEs for trunk support in sitting with initially mod assist progressed to CGA Postural Limitations: decreased with pt requiring heavy reliance on BUE support  Balance Balance Balance Assessed: Yes Static Sitting Balance Static Sitting - Balance Support: Feet supported;Bilateral upper extremity supported Static Sitting - Level of Assistance: 4: Min assist;3: Mod assist(EOB) Static Standing Balance Static Standing - Balance Support: Bilateral upper extremity supported Static Standing - Level of Assistance: 3: Mod assist Static Standing - Comment/# of Minutes: in stedy Extremity Assessment      RLE Assessment RLE Assessment: Exceptions to [facility] Passive Range of Motion (PROM) Comments: limited passive and active hip ROM in all directions due to pain RLE Strength Right Hip Flexion: 2/5(also limited by pain) Right Hip ABduction: 3-/5(also limited by pain) Right Hip ADduction: 3-/5(also limited by pain) Right Knee Flexion: 3/5(also limited by pain) Right Knee Extension: 3-/5(also limited by pain) Right Ankle Dorsiflexion: 3+/5 Right Ankle Plantar Flexion: 3+/5 LLE Assessment LLE Assessment: Exceptions to [facility] LLE Strength Left Hip Flexion: 3-/5 Left Knee Flexion: 3+/5 Left Knee Extension: 3+/5 Left Ankle Dorsiflexion: 3+/5 Left Ankle Plantar Flexion: 3+/5    Refer to Care Plan for Long Term Goals  Recommendations for other services: None   Discharge Criteria: Patient will be discharged from PT if patient refuses treatment 3 consecutive times without medical reason, if treatment goals not met, if there is a change in medical status, if patient makes  no progress towards goals or if patient is discharged from hospital.  The above assessment, treatment plan, treatment alternatives and goals were discussed and mutually agreed upon: by patient and by family (daughter)  Tawana Scale, PT, DPT 09/28/2019, 7:48 AM

## 2019-09-29 ENCOUNTER — Inpatient Hospital Stay (HOSPITAL_COMMUNITY): Payer: Medicare Other

## 2019-09-29 DIAGNOSIS — K5903 Drug induced constipation: Secondary | ICD-10-CM

## 2019-09-29 DIAGNOSIS — R0602 Shortness of breath: Secondary | ICD-10-CM

## 2019-09-29 LAB — CBC WITH DIFFERENTIAL/PLATELET
Abs Immature Granulocytes: 0.04 10*3/uL (ref 0.00–0.07)
Basophils Absolute: 0 10*3/uL (ref 0.0–0.1)
Basophils Relative: 0 %
Eosinophils Absolute: 0 10*3/uL (ref 0.0–0.5)
Eosinophils Relative: 1 %
HCT: 34.8 % — ABNORMAL LOW (ref 36.0–46.0)
Hemoglobin: 11.4 g/dL — ABNORMAL LOW (ref 12.0–15.0)
Immature Granulocytes: 1 %
Lymphocytes Relative: 9 %
Lymphs Abs: 0.6 10*3/uL — ABNORMAL LOW (ref 0.7–4.0)
MCH: 31.1 pg (ref 26.0–34.0)
MCHC: 32.8 g/dL (ref 30.0–36.0)
MCV: 95.1 fL (ref 80.0–100.0)
Monocytes Absolute: 0.6 10*3/uL (ref 0.1–1.0)
Monocytes Relative: 9 %
Neutro Abs: 5.3 10*3/uL (ref 1.7–7.7)
Neutrophils Relative %: 80 %
Platelets: 276 10*3/uL (ref 150–400)
RBC: 3.66 MIL/uL — ABNORMAL LOW (ref 3.87–5.11)
RDW: 15.4 % (ref 11.5–15.5)
WBC: 6.6 10*3/uL (ref 4.0–10.5)
nRBC: 0 % (ref 0.0–0.2)

## 2019-09-29 LAB — COMPREHENSIVE METABOLIC PANEL
ALT: 13 U/L (ref 0–44)
AST: 29 U/L (ref 15–41)
Albumin: 2.4 g/dL — ABNORMAL LOW (ref 3.5–5.0)
Alkaline Phosphatase: 69 U/L (ref 38–126)
Anion gap: 9 (ref 5–15)
BUN: 22 mg/dL (ref 8–23)
CO2: 24 mmol/L (ref 22–32)
Calcium: 8.2 mg/dL — ABNORMAL LOW (ref 8.9–10.3)
Chloride: 107 mmol/L (ref 98–111)
Creatinine, Ser: 1.08 mg/dL — ABNORMAL HIGH (ref 0.44–1.00)
GFR calc Af Amer: 55 mL/min — ABNORMAL LOW (ref >60)
GFR calc non Af Amer: 47 mL/min — ABNORMAL LOW (ref >60)
Glucose, Bld: 162 mg/dL — ABNORMAL HIGH (ref 70–99)
Potassium: 4 mmol/L (ref 3.5–5.1)
Sodium: 140 mmol/L (ref 135–145)
Total Bilirubin: 0.7 mg/dL (ref 0.3–1.2)
Total Protein: 5.2 g/dL — ABNORMAL LOW (ref 6.5–8.1)

## 2019-09-29 LAB — TROPONIN I (HIGH SENSITIVITY)
Troponin I (High Sensitivity): 6 ng/L (ref <18)
Troponin I (High Sensitivity): 8 ng/L (ref <18)

## 2019-09-29 MED ORDER — HYDRALAZINE HCL 25 MG PO TABS
25.0000 mg | ORAL_TABLET | Freq: Two times a day (BID) | ORAL | Status: DC
  Administered 2019-09-29 – 2019-10-12 (×26): 25 mg via ORAL
  Filled 2019-09-29 (×26): qty 1

## 2019-09-29 MED ORDER — POLYETHYLENE GLYCOL 3350 17 G PO PACK
17.0000 g | PACK | Freq: Two times a day (BID) | ORAL | Status: DC
  Administered 2019-09-29 – 2019-10-10 (×10): 17 g via ORAL
  Filled 2019-09-29 (×23): qty 1

## 2019-09-29 MED ORDER — FUROSEMIDE 20 MG PO TABS
20.0000 mg | ORAL_TABLET | Freq: Every day | ORAL | Status: DC
  Administered 2019-09-30: 20 mg via ORAL
  Filled 2019-09-29: qty 1

## 2019-09-29 MED ORDER — IPRATROPIUM-ALBUTEROL 0.5-2.5 (3) MG/3ML IN SOLN
3.0000 mL | Freq: Once | RESPIRATORY_TRACT | Status: AC
  Administered 2019-09-29: 3 mL via RESPIRATORY_TRACT

## 2019-09-29 MED ORDER — IPRATROPIUM-ALBUTEROL 0.5-2.5 (3) MG/3ML IN SOLN
RESPIRATORY_TRACT | Status: AC
  Filled 2019-09-29: qty 3

## 2019-09-29 MED ORDER — FUROSEMIDE 10 MG/ML IJ SOLN
40.0000 mg | Freq: Once | INTRAMUSCULAR | Status: AC
  Administered 2019-09-29: 40 mg via INTRAVENOUS
  Filled 2019-09-29: qty 4

## 2019-09-29 NOTE — Progress Notes (Signed)
Call received from Dr. Posey Pronto- orders received for Lasix 40mg  IV once and Lasix 20mg  oral starting tomorrow. Orders for BNP and BMP for tomorrow morning.   Nicholes Rough, RN

## 2019-09-29 NOTE — Progress Notes (Signed)
Patient's daughter called RN to room. Patient c/o [facility], feeling flushed and hot and "just not feeling good". RN noted patient was using accessory muscles to breath and was flushed in the face. RN checked patients vitals BP 184/70, pulse 70, Resp 24, O2 93% room air. Nurse called on call MD (Dr. Posey Pronto) verbal orders received for STAT EKG,CBC, CMP, Tropin, O2, venous doppler. Rapid Response nurse paged and at bedside with-in minutes. Report given to Rapid nurse and STAT CXR order received.   Per Dr. Posey Pronto once results from labs and imaging give page.  Nicholes Rough, RN

## 2019-09-29 NOTE — Significant Event (Signed)
Rapid Response Event Note  Overview: RRT - Respiratory   Initial Focused Assessment: Responded to RRT page, per nurse - patient had acute onset of shortness of breath and overall was not feeling good. Per nurse, patient became red/flushed in her face and was short of breath.   Upon arrival, patient was flushed and red in the face, an EKG was being obtained, skin was warm and dry, SBP in the 180s, labored breathing coupled with accessory muscle use. Patient was alert, able to follow commands, no focal neuro deficit noticed. Lung sounds - upper airway - mild wheezing, slight cardiac wheeze, overall good air movement. + 1 edema in BLE. HR 70s - SR, 93 on RA - was placed on 2L [place]. Ongoing shortness of breath with talking and repositioning.   Interventions: -- STAT EKG - overall appears stable -- STAT CXR - small LT pleural effusion  -- STAT LABS - no acute results -- Vascular US - no DVT per [place] tech -- DouNeb x 1 - significant improvement in SOB and wheezing after treatment.   Plan of Care: -- I glanced at the CXR after it was completed - patient could benefit from Lasix  -- Wean oxygen as patient tolerates -- Patient appears overall much better, she was able to talk in full sentences, her color improved, and she looks more comfortable now.  -- Has not had much of an appetite - likes [employer], will ask nursing staff if it is okay for her to have.  Event Summary:  Start Time 1245  End Time 1400   Elfreida Heggs R

## 2019-09-29 NOTE — Progress Notes (Signed)
Turned patients O2 down  to 1L patient's O2 maintained at 97%.  Turned O2 off and patients O2 dropped to 94%. Patient requested to have O2 at 1 l for comfort.    Nicholes Rough, RN

## 2019-09-29 NOTE — Progress Notes (Signed)
VASCULAR LAB PRELIMINARY  PRELIMINARY  PRELIMINARY  PRELIMINARY  Bilateral lower extremity venous duplex completed.    Preliminary report:  See CV proc for preliminary results.   Nahum Sherrer, RVT 09/29/2019, 2:46 PM

## 2019-09-29 NOTE — Progress Notes (Signed)
[facility] PHYSICAL MEDICINE & REHABILITATION PROGRESS NOTE  Subjective/Complaints: Patient seen sitting up in bed this morning.  She states she slept well overnight.  She states she had a fair first day of therapies yesterday, but had difficulties try to have a bowel movement.  Nursing notes reviewed.  ROS: + Constipation.  Denies CP, SOB, N/V/D  Objective: Vital Signs: Blood pressure (!) 151/63, pulse 63, temperature 98.9 F (37.2 C), temperature source Oral, resp. rate 20, weight 84.1 kg, SpO2 94 %. No results found. Recent Labs    09/27/19 0822  HGB 10.9*  HCT 33.5*   Recent Labs    09/27/19 0822  NA 139  K 3.9  CL 104  CO2 25  GLUCOSE 106*  BUN 31*  CREATININE 1.48*  CALCIUM 8.1*    Physical Exam: BP (!) 151/63 (BP Location: Right Arm)   Pulse 63   Temp 98.9 F (37.2 C) (Oral)   Resp 20   Wt 84.1 kg   SpO2 94%   BMI 29.93 kg/m  Constitutional: No distress . Vital signs reviewed. HENT: Normocephalic.  Atraumatic. Eyes: EOMI. No discharge. Cardiovascular: No JVD. Respiratory: Normal effort.  No stridor. GI: Non-distended. Skin: Right hip dressing C/D/I Vascular changes bilateral lower extremities Psych: Normal mood.  Normal behavior. Musc: Bilateral, right greater than left lower extremity edema Neurological: Alert She follows commands.  Motor:  Left lower extremity: Hip flexion, knee extension 4-/5, ankle dorsiflexion 4/5 Right lower extremity: Hip flexion, knee extension 3-/5, ankle dorsiflexion 4 -/5   Assessment/Plan: 1. Functional deficits secondary to right subtrochanteric femur fracture which require 3+ hours per day of interdisciplinary therapy in a comprehensive inpatient rehab setting.  Physiatrist is providing close team supervision and 24 hour management of active medical problems listed below.  Physiatrist and rehab team continue to assess barriers to discharge/monitor patient progress toward functional and medical goals  Care  Tool:  Bathing  Bathing activity did not occur: Safety/medical concerns Body parts bathed by patient: Right arm, Left arm, Chest, Abdomen, Right upper leg, Left upper leg, Face   Body parts bathed by helper: Front perineal area, Buttocks, Right lower leg, Left lower leg     Bathing assist Assist Level: Maximal Assistance - Patient 24 - 49%     Upper Body Dressing/Undressing Upper body dressing Upper body dressing/undressing activity did not occur (including orthotics): N/A(patient had a gown on) What is the patient wearing?: Pull over shirt    Upper body assist Assist Level: Minimal Assistance - Patient > 75%(sitting balnace EOB)    Lower Body Dressing/Undressing Lower body dressing    Lower body dressing activity did not occur: N/A(no incontinent brief on) What is the patient wearing?: Incontinence brief     Lower body assist Assist for lower body dressing: 2 Helpers     Toileting Toileting    Toileting assist Assist for toileting: Total Assistance - Patient < 25%     Transfers Chair/bed transfer  Transfers assist  Chair/bed transfer activity did not occur: Safety/medical concerns  Chair/bed transfer assist level: Dependent - mechanical lift(stedy)     Locomotion Ambulation   Ambulation assist   Ambulation activity did not occur: Safety/medical concerns          Walk 10 feet activity   Assist  Walk 10 feet activity did not occur: Safety/medical concerns        Walk 50 feet activity   Assist Walk 50 feet with 2 turns activity did not occur: Safety/medical concerns  Walk 150 feet activity   Assist Walk 150 feet activity did not occur: Safety/medical concerns         Walk 10 feet on uneven surface  activity   Assist Walk 10 feet on uneven surfaces activity did not occur: Safety/medical concerns         Wheelchair     Assist Will patient use wheelchair at discharge?: No             Wheelchair 50 feet with 2  turns activity    Assist            Wheelchair 150 feet activity     Assist           Medical Problem List and Plan: 1.Decreased functional mobilitysecondary to right subtrochanteric hip fracture. Status post IM nailing with ORIF 09/24/2019.  Continue CIR 2. Antithrombotics: -DVT/anticoagulation:Chronic Eliquis -antiplatelet therapy: N/A 3. Pain Management:Hydrocodone as needed  Relatively controlled on 10/18 4. Mood:Provide emotional support -antipsychotic agents: N/A 5. Neuropsych: This patientiscapable of making decisions on herown behalf. 6. Skin/Wound Care:Routine skin checks  Prevalon boots ordered 7. Fluids/Electrolytes/Nutrition:Routine in and outs 8. Acute blood loss anemia.  Hemoglobin 10.9 on 10/16, labs ordered for tomorrow 9. PAF. Continue Eliquis as well as Tambocor 100 mg twice daily, Tenormin 12.5 mg twice daily. Cardiac rate controlled 10. Hyperlipidemia. Lipitor 11. Metabolic bone disease. Continue vitamin D supplements as directed 12. CKD stage III. Creatinine baseline 1.49.  Creatinine 1.48 on 10/16, labs ordered for tomorrow 13. Chronic diastolic congestive heart failure monitor for any signs of fluid overload. Followed by cardiology services.Demadex remains on hold per cardiology services Filed Weights   09/27/19 1926 09/29/19 0353  Weight: 86 kg 84.1 kg  14. Lymphedema.She did have a home health nurse to assist in leg wraps when needed -elevate leg in bed/chair -TEDS 15.Endometrial cancer.Patient is followed by Dr. Polly Cobia in [place]. Patient maintained on Megace 80 mg 2 times daily takes for 3 weeks and alternates with Nolvadex20 mg daily for 3 weeks 16.  Essential hypertension  Continue Tenormin 12.5 twice daily  Continue hydralazine 12.5 twice daily, increased to 25 twice daily on 10/18  Monitor with increased mobility 17.  Drug-induced  constipation  Bowel meds increased on 10/18  LOS: 2 days A FACE TO FACE EVALUATION WAS PERFORMED  Ankit Lorie Phenix 09/29/2019, 8:14 AM

## 2019-09-29 NOTE — Significant Event (Signed)
Rapid Response Event Note  Follow Up -- Respiratory   I came by to see Christina Morgan again, she was much better, stated she felt better. Per nurse, had some transient SOB with exertion while being repositioned but it resolved. Patient received Lasix, breathing comfortably. Wean oxygen as patient tolerates.  Kerby Borner R

## 2019-09-29 NOTE — Progress Notes (Signed)
Late entry:  Please see progress note from earlier today.  Called by nurse after being informed by daughter regarding acute diaphoresis, SOB, flushing and relative desaturations. Labs, ECG, Cxray, dopplers ordered. Evaluated patient with rapid response. Diaphoresis and flushing improved, however, SOB persistent with abdominal breathing.

## 2019-09-30 ENCOUNTER — Inpatient Hospital Stay (HOSPITAL_COMMUNITY): Payer: Medicare Other | Admitting: Occupational Therapy

## 2019-09-30 ENCOUNTER — Inpatient Hospital Stay (HOSPITAL_COMMUNITY): Payer: Medicare Other | Admitting: Physical Therapy

## 2019-09-30 LAB — COMPREHENSIVE METABOLIC PANEL
ALT: 17 U/L (ref 0–44)
AST: 26 U/L (ref 15–41)
Albumin: 2.5 g/dL — ABNORMAL LOW (ref 3.5–5.0)
Alkaline Phosphatase: 68 U/L (ref 38–126)
Anion gap: 6 (ref 5–15)
BUN: 22 mg/dL (ref 8–23)
CO2: 27 mmol/L (ref 22–32)
Calcium: 8.3 mg/dL — ABNORMAL LOW (ref 8.9–10.3)
Chloride: 107 mmol/L (ref 98–111)
Creatinine, Ser: 1.08 mg/dL — ABNORMAL HIGH (ref 0.44–1.00)
GFR calc Af Amer: 55 mL/min — ABNORMAL LOW (ref >60)
GFR calc non Af Amer: 47 mL/min — ABNORMAL LOW (ref >60)
Glucose, Bld: 109 mg/dL — ABNORMAL HIGH (ref 70–99)
Potassium: 4.2 mmol/L (ref 3.5–5.1)
Sodium: 140 mmol/L (ref 135–145)
Total Bilirubin: 0.8 mg/dL (ref 0.3–1.2)
Total Protein: 5.5 g/dL — ABNORMAL LOW (ref 6.5–8.1)

## 2019-09-30 LAB — CBC WITH DIFFERENTIAL/PLATELET
Abs Immature Granulocytes: 0.06 10*3/uL (ref 0.00–0.07)
Basophils Absolute: 0 10*3/uL (ref 0.0–0.1)
Basophils Relative: 0 %
Eosinophils Absolute: 0.1 10*3/uL (ref 0.0–0.5)
Eosinophils Relative: 1 %
HCT: 34.9 % — ABNORMAL LOW (ref 36.0–46.0)
Hemoglobin: 10.9 g/dL — ABNORMAL LOW (ref 12.0–15.0)
Immature Granulocytes: 1 %
Lymphocytes Relative: 8 %
Lymphs Abs: 0.6 10*3/uL — ABNORMAL LOW (ref 0.7–4.0)
MCH: 30.4 pg (ref 26.0–34.0)
MCHC: 31.2 g/dL (ref 30.0–36.0)
MCV: 97.2 fL (ref 80.0–100.0)
Monocytes Absolute: 0.8 10*3/uL (ref 0.1–1.0)
Monocytes Relative: 10 %
Neutro Abs: 6 10*3/uL (ref 1.7–7.7)
Neutrophils Relative %: 80 %
Platelets: 291 10*3/uL (ref 150–400)
RBC: 3.59 MIL/uL — ABNORMAL LOW (ref 3.87–5.11)
RDW: 15.1 % (ref 11.5–15.5)
WBC: 7.5 10*3/uL (ref 4.0–10.5)
nRBC: 0 % (ref 0.0–0.2)

## 2019-09-30 LAB — BRAIN NATRIURETIC PEPTIDE: B Natriuretic Peptide: 270.5 pg/mL — ABNORMAL HIGH (ref 0.0–100.0)

## 2019-09-30 MED ORDER — TAMOXIFEN CITRATE 10 MG PO TABS
20.0000 mg | ORAL_TABLET | Freq: Every day | ORAL | Status: DC
  Administered 2019-10-01 – 2019-10-12 (×12): 20 mg via ORAL
  Filled 2019-09-30 (×12): qty 2

## 2019-09-30 MED ORDER — POTASSIUM CHLORIDE CRYS ER 20 MEQ PO TBCR
20.0000 meq | EXTENDED_RELEASE_TABLET | Freq: Two times a day (BID) | ORAL | Status: DC
  Administered 2019-09-30 – 2019-10-12 (×24): 20 meq via ORAL
  Filled 2019-09-30 (×24): qty 1

## 2019-09-30 MED ORDER — IPRATROPIUM-ALBUTEROL 0.5-2.5 (3) MG/3ML IN SOLN
3.0000 mL | Freq: Two times a day (BID) | RESPIRATORY_TRACT | Status: DC
  Administered 2019-09-30 – 2019-10-02 (×4): 3 mL via RESPIRATORY_TRACT
  Filled 2019-09-30 (×4): qty 3

## 2019-09-30 MED ORDER — TORSEMIDE 20 MG PO TABS
20.0000 mg | ORAL_TABLET | Freq: Two times a day (BID) | ORAL | Status: DC
  Administered 2019-10-01 – 2019-10-03 (×5): 20 mg via ORAL
  Filled 2019-09-30 (×5): qty 1

## 2019-09-30 MED ORDER — IPRATROPIUM-ALBUTEROL 0.5-2.5 (3) MG/3ML IN SOLN
3.0000 mL | Freq: Four times a day (QID) | RESPIRATORY_TRACT | Status: DC | PRN
  Administered 2019-10-06 – 2019-10-07 (×2): 3 mL via RESPIRATORY_TRACT
  Filled 2019-09-30 (×2): qty 3

## 2019-09-30 NOTE — Plan of Care (Signed)
  Problem: Consults Goal: RH GENERAL PATIENT EDUCATION Description: See Patient Education module for education specifics. Outcome: Progressing Goal: Skin Care Protocol Initiated - if Braden Score 18 or less Description: If consults are not indicated, leave blank or document N/A Outcome: Progressing   Problem: RH BOWEL ELIMINATION Goal: RH STG MANAGE BOWEL WITH ASSISTANCE Description: STG Manage Bowel with mod I Assistance. Outcome: Progressing Flowsheets (Taken 09/30/2019 1444) STG: Pt will manage bowels with assistance: 3-Moderate assistance   Problem: RH BLADDER ELIMINATION Goal: RH STG MANAGE BLADDER WITH ASSISTANCE Description: STG Manage Bladder With mod I Assistance Outcome: Progressing Flowsheets (Taken 09/30/2019 1444) STG: Pt will manage bladder with assistance: 3-Moderate assistance   Problem: RH SKIN INTEGRITY Goal: RH STG SKIN FREE OF INFECTION/BREAKDOWN Outcome: Progressing Goal: RH STG MAINTAIN SKIN INTEGRITY WITH ASSISTANCE Description: STG Maintain Skin Integrity With mod I Assistance. Outcome: Progressing Flowsheets (Taken 09/30/2019 1444) STG: Maintain skin integrity with assistance: 3-Moderate assistance Goal: RH STG ABLE TO PERFORM INCISION/WOUND CARE W/ASSISTANCE Description: STG Able To Perform Incision/Wound Care With mod I Assistance. Outcome: Progressing Flowsheets (Taken 09/30/2019 1444) STG: Pt will be able to perform incision/wound care with assistance: 3-Moderate assistance   Problem: RH SAFETY Goal: RH STG ADHERE TO SAFETY PRECAUTIONS W/ASSISTANCE/DEVICE Description: STG Adhere to Safety Precautions With Assistance/Device. Outcome: Progressing Flowsheets (Taken 09/30/2019 1444) STG:Pt will adhere to safety precautions with assistance/device: 3-Moderate assistance   Problem: RH PAIN MANAGEMENT Goal: RH STG PAIN MANAGED AT OR BELOW PT'S PAIN GOAL Description: Pain scale <4/10 Outcome: Progressing   Problem: RH KNOWLEDGE DEFICIT  GENERAL Goal: RH STG INCREASE KNOWLEDGE OF SELF CARE AFTER HOSPITALIZATION Outcome: Progressing

## 2019-09-30 NOTE — Progress Notes (Signed)
Occupational Therapy Session Note  Patient Details  Name: Christina Morgan MRN: [id number] Date of Birth: December 11, 1933  Today's Date: 09/30/2019 OT Individual Time: 1400-1500 OT Individual Time Calculation (min): 60 min    Short Term Goals: Week 1:  OT Short Term Goal 1 (Week 1): Pt will sit to stand wiht MAX A and LRAD in prep for clothing managment OT Short Term Goal 2 (Week 1): Pt will thread BLE with MIN A and AE PRN OT Short Term Goal 3 (Week 1): Pt will sit EOB/EOM for 5 min with S to demo improved sitting balnace OT Short Term Goal 4 (Week 1): Pt will don shirt EOB with S  Skilled Therapeutic Interventions/Progress Updates:    Pt seen for OT session fousing on funtional mobility and ativity toleranec. Pt in supine upon arrival, voiing fatigue from previous tx sessions, but willing to partiipate as able and denying pain. Pt on 1L supplemental O2 throughout session with VCs throughout for deep breathing techniques/ pursed lip breathing.  She transferred to sitting EOB with max A and significantly increased time with VCs for technique/sequencing.  Completed x2 sit<>stand from highly elevated EOB with min-mod A to RW. Pt tolerating ~20 seconds each trial before requiring seated rest break. Pt with limited ability to shift weight R/L while in standing.  Attempted sliding board transfer to w/c, max-total A overall.  In therapy gym, completed x2 sit<>stand from w/c with mod A. Attempted stand pivot transfer to EOM, mod A overall, however, due to the significant amount of time pt required to transfer, she was unable to complete transfer in entirety and required w/c to be placed behind her in order to safely sit.  Pt returned to room, used STEDY with mod A to return to bed. Mod A to return to supine with +2 required to position for comfort in bed. Pt left with all needs in reach, bed alarm on.    Therapy Documentation Precautions:  Precautions Precautions: Fall Restrictions Weight Bearing  Restrictions: Yes RLE Weight Bearing: Weight bearing as tolerated   Therapy/Group: Individual Therapy  Sarahbeth Cashin L 09/30/2019, 2:11 PM

## 2019-09-30 NOTE — Progress Notes (Signed)
Physical Therapy Session Note  Patient Details  Name: Christina Morgan MRN: [id number] Date of Birth: [birth year]  Today's Date: 09/30/2019 PT Individual Time: 1100-1200 PT Individual Time Calculation (min): 60 min   Short Term Goals: Week 1:  PT Short Term Goal 1 (Week 1): Pt will perform supine<>sit with mod assist PT Short Term Goal 2 (Week 1): Pt will perform sit<>stand from chair height using LRAD with mod assist PT Short Term Goal 3 (Week 1): Pt will perform bed<>chair transfers with max assist PT Short Term Goal 4 (Week 1): Pt wil initiate gait  Skilled Therapeutic Interventions/Progress Updates:    Pt received semi-reclined in bed, agreeable to PT session. No complaints of pain. Pt agreeable to get up to w/c, reports urge to urinate. Supine to sit with max A for BLE management and trunk control, increased time needed to complete. Pt initially requires mod A for sitting balance EOB due to posterior lean, progresses to CGA. Sit to stand with max A to stedy from elevated bed. Stedy transfer bed to elevated BSC. Pt is dependent for clothing management and pericare. Pt attempts to have BM while seated on BSC, unsuccessful but does have bloody smear, RN notified. Max A to stand to stedy from elevated BSC. Stedy transfer back to bed. Attempt to have pt scoot hips back towards middle of the bed, pt requires max multimodal cueing for lateral leans and scooting one hip at a time, mod A to scoot. Sit to supine max A for BLE management and trunk control. Rolling L/R with mod A and use of bedrails to reposition in bed. Pt left semi-reclined in bed with needs in reach at end of session, bed alarm in place, daughter present. Pt on 1L O2 throughout session, SpO2 96% with activity.  Therapy Documentation Precautions:  Precautions Precautions: Fall Restrictions Weight Bearing Restrictions: Yes RLE Weight Bearing: Weight bearing as tolerated    Therapy/Group: Individual Therapy   Excell Seltzer,  PT, DPT  09/30/2019, 2:13 PM

## 2019-09-30 NOTE — Progress Notes (Signed)
Occupational Therapy Session Note  Patient Details  Name: SHAQUAYLA KLIMAS MRN: [id number] Date of Birth: 1933-01-03  Today's Date: 09/30/2019 OT Individual Time: 0848-1000 OT Individual Time Calculation (min): 72 min    Short Term Goals: Week 1:  OT Short Term Goal 1 (Week 1): Pt will sit to stand wiht MAX A and LRAD in prep for clothing managment OT Short Term Goal 2 (Week 1): Pt will thread BLE with MIN A and AE PRN OT Short Term Goal 3 (Week 1): Pt will sit EOB/EOM for 5 min with S to demo improved sitting balnace OT Short Term Goal 4 (Week 1): Pt will don shirt EOB with S  Skilled Therapeutic Interventions/Progress Updates:    Treatment session with focus on functional transfers and sit <> stand during self-care tasks.  Pt received supine in bed agreeable to participation in therapy session.  Pt required max assist for bed mobility to roll in to sidelying to increase success with mobility to EOB.  Utilized Stedy to transfer bed > BSC with mod assist sit > stand from elevated EOB.  Pt incontinent of small amounts of stool prior to transfer to toilet, and completed BM and voided urine on toilet.  Engaged in sit <> stand from elevated BSC in [place] with Min assist for therapist to complete hygiene post toileting.  Engaged in grooming and bathing seated at sink with setup for items.  Pt total assist for LB dressing - would benefit from reacher for LB dressing.  Required max assist +2 sit > stand from w/c in to [facility] from w/c after prolonged sitting and lower w/c height.  Returned to bed via Stedy and left in supine with RN in to assess skin on bottom.  Therapy Documentation Precautions:  Precautions Precautions: Fall Restrictions Weight Bearing Restrictions: (P) Yes RLE Weight Bearing: (P) Weight bearing as tolerated Pain: Pain Assessment Pain Scale: 0-10 Pain Score: 0-No pain   Therapy/Group: Individual Therapy  Simonne Come 09/30/2019, 10:34 AM

## 2019-09-30 NOTE — Progress Notes (Signed)
[facility] PHYSICAL MEDICINE & REHABILITATION PROGRESS NOTE  Subjective/Complaints:   Pt reports she's squeaking when breathing- just stopped prior to me entering room.  Just started O2 yesterday after was assessed by rapid response- on 2L O2 by [place] currently. Said she was "breathing from stomach".   ROS:  Denies CP, SOB, N/V/D and constipation is better after 2 BMs this AM.  Objective: Vital Signs: Blood pressure (!) 156/54, pulse 66, temperature 98.3 F (36.8 C), temperature source Oral, resp. rate 18, weight 83.2 kg, SpO2 99 %. Dg Chest 1 View  Result Date: 09/29/2019 CLINICAL DATA:  Shortness of breath. EXAM: CHEST  1 VIEW COMPARISON:  September 23, 2019 FINDINGS: There is a small left pleural effusion with underlying atelectasis. The heart, hila, mediastinum, lungs, and pleura otherwise unchanged. IMPRESSION: Small stable left pleural effusion with atelectasis. No other change. Electronically Signed   By: Dorise Bullion III M.D   On: 09/29/2019 14:13   Vas [place] Lower Extremity Venous (dvt)  Result Date: 09/29/2019  Lower Venous Study Indications: Acute SOB.  Risk Factors: Closed displaced intertrochanteric fracture of right femur: status post IM nailing 10/13. CHF, lymphedema, Paroxysmal atrial fibrillation Limitations: Significant edema (pitting in right calf). Comparison Study: Prior negative study from 03/30/2018 is available for                   comparison Performing Technologist: Sharion Dove RVS  Examination Guidelines: A complete evaluation includes B-mode imaging, spectral Doppler, color Doppler, and power Doppler as needed of all accessible portions of each vessel. Bilateral testing is considered an integral part of a complete examination. Limited examinations for reoccurring indications may be performed as noted.  +---------+---------------+---------+-----------+----------+--------------+ RIGHT    CompressibilityPhasicitySpontaneityPropertiesThrombus Aging  +---------+---------------+---------+-----------+----------+--------------+ CFV      Full           Yes      Yes                                 +---------+---------------+---------+-----------+----------+--------------+ SFJ      Full                                                        +---------+---------------+---------+-----------+----------+--------------+ FV Prox  Full                                                        +---------+---------------+---------+-----------+----------+--------------+ FV Mid   Full                                                        +---------+---------------+---------+-----------+----------+--------------+ FV DistalFull                                                        +---------+---------------+---------+-----------+----------+--------------+ PFV  Full                                                        +---------+---------------+---------+-----------+----------+--------------+ POP      Full           Yes      Yes                                 +---------+---------------+---------+-----------+----------+--------------+ PTV      Full                                                        +---------+---------------+---------+-----------+----------+--------------+ PERO     Full                                                        +---------+---------------+---------+-----------+----------+--------------+   +---------+---------------+---------+-----------+----------+--------------+ LEFT     CompressibilityPhasicitySpontaneityPropertiesThrombus Aging +---------+---------------+---------+-----------+----------+--------------+ CFV      Full           Yes      Yes                                 +---------+---------------+---------+-----------+----------+--------------+ SFJ      Full                                                         +---------+---------------+---------+-----------+----------+--------------+ FV Prox  Full                                                        +---------+---------------+---------+-----------+----------+--------------+ FV Mid   Full                                                        +---------+---------------+---------+-----------+----------+--------------+ FV DistalFull                                                        +---------+---------------+---------+-----------+----------+--------------+ PFV      Full                                                        +---------+---------------+---------+-----------+----------+--------------+  POP      Full           Yes      Yes                                 +---------+---------------+---------+-----------+----------+--------------+ PTV      Full                                                        +---------+---------------+---------+-----------+----------+--------------+ PERO     Full                                                        +---------+---------------+---------+-----------+----------+--------------+     Summary: Right: There is no evidence of deep vein thrombosis in the lower extremity. Left: There is no evidence of deep vein thrombosis in the lower extremity.  *See table(s) above for measurements and observations. Electronically signed by Harold Barban MD on 09/29/2019 at 4:34:00 PM.    Final    Recent Labs    09/29/19 1310  WBC 6.6  HGB 11.4*  HCT 34.8*  PLT 276   Recent Labs    09/29/19 1310  NA 140  K 4.0  CL 107  CO2 24  GLUCOSE 162*  BUN 22  CREATININE 1.08*  CALCIUM 8.2*    Physical Exam: BP (!) 156/54 (BP Location: Left Arm)   Pulse 66   Temp 98.3 F (36.8 C) (Oral)   Resp 18   Wt 83.2 kg   SpO2 99%   BMI 29.61 kg/m  Constitutional: No distress . Vital signs reviewed. HENT: Normocephalic.  Atraumatic. Eyes: conjugate gaze B/L Cardiovascular: RRR-  . Respiratory: CTA B/L when I listened- and listened for 3+ minutes- no wheezes, rales or rhonchi, however, sounds like pt describing wheezing (squeaky); no accessory muscle use of any kind. GI: Non-distended. Skin: Right hip dressing C/D/I Vascular changes bilateral lower extremities Psych: Normal mood.  Normal behavior. Musc: Bilateral, right greater than left lower extremity edema Neurological: Alert She follows commands.  Motor:  Left lower extremity: Hip flexion, knee extension 4-/5, ankle dorsiflexion 4/5 Right lower extremity: Hip flexion, knee extension 3-/5, ankle dorsiflexion 4 -/5   Assessment/Plan: 1. Functional deficits secondary to right subtrochanteric femur fracture which require 3+ hours per day of interdisciplinary therapy in a comprehensive inpatient rehab setting.  Physiatrist is providing close team supervision and 24 hour management of active medical problems listed below.  Physiatrist and rehab team continue to assess barriers to discharge/monitor patient progress toward functional and medical goals  Care Tool:  Bathing  Bathing activity did not occur: Safety/medical concerns Body parts bathed by patient: Right arm, Left arm, Chest, Abdomen, Right upper leg, Left upper leg, Face   Body parts bathed by helper: Front perineal area, Buttocks, Right lower leg, Left lower leg     Bathing assist Assist Level: Maximal Assistance - Patient 24 - 49%     Upper Body Dressing/Undressing Upper body dressing Upper body dressing/undressing activity did not occur (including orthotics): N/A(patient had a gown on) What is the patient wearing?: Pull over shirt  Upper body assist Assist Level: Minimal Assistance - Patient > 75%(sitting balnace EOB)    Lower Body Dressing/Undressing Lower body dressing    Lower body dressing activity did not occur: N/A(no incontinent brief on) What is the patient wearing?: Incontinence brief     Lower body assist Assist for lower  body dressing: 2 Helpers     Toileting Toileting    Toileting assist Assist for toileting: Total Assistance - Patient < 25%     Transfers Chair/bed transfer  Transfers assist  Chair/bed transfer activity did not occur: Safety/medical concerns  Chair/bed transfer assist level: Dependent - mechanical lift     Locomotion Ambulation   Ambulation assist   Ambulation activity did not occur: Safety/medical concerns          Walk 10 feet activity   Assist  Walk 10 feet activity did not occur: Safety/medical concerns        Walk 50 feet activity   Assist Walk 50 feet with 2 turns activity did not occur: Safety/medical concerns         Walk 150 feet activity   Assist Walk 150 feet activity did not occur: Safety/medical concerns         Walk 10 feet on uneven surface  activity   Assist Walk 10 feet on uneven surfaces activity did not occur: Safety/medical concerns         Wheelchair     Assist Will patient use wheelchair at discharge?: No             Wheelchair 50 feet with 2 turns activity    Assist            Wheelchair 150 feet activity     Assist           Medical Problem List and Plan: 1.Decreased functional mobilitysecondary to right subtrochanteric hip fracture. Status post IM nailing with ORIF 09/24/2019.  Continue CIR 2. Antithrombotics: -DVT/anticoagulation:Chronic Eliquis -antiplatelet therapy: N/A 3. Pain Management:Hydrocodone as needed  Relatively controlled on 10/18 4. Mood:Provide emotional support -antipsychotic agents: N/A 5. Neuropsych: This patientiscapable of making decisions on herown behalf. 6. Skin/Wound Care:Routine skin checks  Prevalon boots ordered 7. Fluids/Electrolytes/Nutrition:Routine in and outs 8. Acute blood loss anemia.  Hemoglobin 10.9 on 10/16, labs ordered for tomorrow 9. PAF. Continue Eliquis as well as Tambocor 100 mg  twice daily, Tenormin 12.5 mg twice daily. Cardiac rate controlled 10. Hyperlipidemia. Lipitor 11. Metabolic bone disease. Continue vitamin D supplements as directed 12. CKD stage III. Creatinine baseline 1.49.  Creatinine 1.48 on 10/16, labs ordered for tomorrow  10/19- labs pending 13. Chronic diastolic congestive heart failure monitor for any signs of fluid overload. Followed by cardiology services.Demadex remains on hold per cardiology services  10/19- BNP pending- weight is good.  Filed Weights   09/27/19 1926 09/29/19 0353 09/30/19 0347  Weight: 86 kg 84.1 kg 83.2 kg  14. Lymphedema.She did have a home health nurse to assist in leg wraps when needed -elevate leg in bed/chair -TEDS 15.Endometrial cancer.Patient is followed by Dr. Polly Cobia in [place]. Patient maintained on Megace 80 mg 2 times daily takes for 3 weeks and alternates with Nolvadex20 mg daily for 3 weeks 16.  Essential hypertension  Continue Tenormin 12.5 twice daily  Continue hydralazine 12.5 twice daily, increased to 25 twice daily on 10/18  Monitor with increased mobility 17.  Drug-induced constipation  Bowel meds increased on 10/18  18, New onset wheezing  10/19- will order Duonebs BID x 3 days then reassess  and also prn- esp due to "squeaky" sounds. Also still needing O2 currently.   LOS: 3 days A FACE TO FACE EVALUATION WAS PERFORMED  Bowyn Mercier 09/30/2019, 9:57 AM

## 2019-09-30 NOTE — Progress Notes (Signed)
Inpatient Rehabilitation  Patient information reviewed and entered into eRehab system by Maiko Salais M. Teddi Badalamenti, M.A., CCC/SLP, PPS Coordinator.  Information including medical coding, functional ability and quality indicators will be reviewed and updated through discharge.    

## 2019-09-30 NOTE — Progress Notes (Signed)
Patient's Demadex has been resumed 20 mg twice daily as initially had been on hold while on acute as patient had been a bit dry.  Latest chemistries stable with BUN 22 creatinine 1.08. Continue to  monitor for any signs of fluid overload

## 2019-09-30 NOTE — IPOC Note (Signed)
Overall Plan of Care (IPOC) Patient Details Name: Christina Morgan MRN: [id number] DOB: [birth year]  Admitting Diagnosis: Closed subtrochanteric fracture of hip, right, sequela  Hospital Problems: Principal Problem:   Closed subtrochanteric fracture of hip, right, sequela Active Problems:   Traumatic closed trochanteric fracture of femur with minimal displacement, left, with delayed healing, subsequent encounter   Essential hypertension   Chronic diastolic congestive heart failure (HCC)   Stage 3 chronic kidney disease   Acute blood loss anemia   Drug induced constipation   Shortness of breath     Functional Problem List: Nursing    PT Balance, Perception, Behavior, Edema, Endurance, Motor, Pain, Skin Integrity, Nutrition, Safety  OT Balance, Endurance, Motor, Pain, Safety, Sensory, Skin Integrity  SLP    TR         Basic ADL's: OT Grooming, Bathing, Dressing, Toileting     Advanced  ADL's: OT       Transfers: PT Bed Mobility, Bed to Chair, Car, Manufacturing systems engineer, Metallurgist: PT Ambulation, Emergency planning/management officer, Stairs     Additional Impairments: OT Fuctional Use of Upper Extremity  SLP        TR      Anticipated Outcomes Item Anticipated Outcome  Self Feeding no goal  Swallowing      Basic self-care  S grooming/UB dressing; MIN A B dressing  Toileting  MIN A   Bathroom Transfers MIN A  Bowel/Bladder     Transfers  CGA using LRAD  Locomotion  min assist short distances using LRAD  Communication     Cognition     Pain     Safety/Judgment      Therapy Plan: PT Intensity: Minimum of 1-2 x/day ,45 to 90 minutes PT Frequency: 5 out of 7 days PT Duration Estimated Length of Stay: 3-3.5 weeks OT Intensity: Minimum of 1-2 x/day, 45 to 90 minutes OT Frequency: 5 out of 7 days OT Duration/Estimated Length of Stay: 3-4 weeks     Due to the current state of emergency, patients may not be receiving their 3-hours of  Medicare-mandated therapy.   Team Interventions: Nursing Interventions    PT interventions Ambulation/gait training, Community reintegration, DME/adaptive equipment instruction, Neuromuscular re-education, Psychosocial support, Stair training, UE/LE Strength taining/ROM, Wheelchair propulsion/positioning, Training and development officer, Discharge planning, Functional electrical stimulation, Pain management, Skin care/wound management, Therapeutic Activities, UE/LE Coordination activities, Cognitive remediation/compensation, Disease management/prevention, Functional mobility training, Patient/family education, Splinting/orthotics, Therapeutic Exercise, Visual/perceptual remediation/compensation  OT Interventions Balance/vestibular training, Discharge planning, Pain management, Self Care/advanced ADL retraining, Therapeutic Activities, UE/LE Coordination activities, Therapeutic Exercise, Skin care/wound managment, Patient/family education, Functional mobility training, Disease mangement/prevention, Community reintegration, Engineer, drilling, Neuromuscular re-education, Splinting/orthotics, Psychosocial support, UE/LE Strength taining/ROM, Wheelchair propulsion/positioning  SLP Interventions    TR Interventions    SW/CM Interventions Discharge Planning, Psychosocial Support, Patient/Family Education   Barriers to Discharge MD  Medical stability, Home enviroment access/loayout, Weight bearing restrictions, Medication compliance, New oxygen and new onset wheezing, rapid response 10/18  Nursing      PT Inaccessible home environment, Decreased caregiver support, Weight bearing restrictions, Lack of/limited family support, Home environment access/layout    OT Inaccessible home environment, Decreased caregiver support, Home environment access/layout, Lack of/limited family support, Weight, Weight bearing restrictions    SLP      SW       Team Discharge Planning: Destination: PT-Home ,OT-  Home , SLP-  Projected Follow-up: PT-Home health PT, 24 hour supervision/assistance, OT-  Home health OT, SLP-  Projected Equipment Needs: PT-To be determined, Other (comment), OT- Tub/shower bench, SLP-  Equipment Details: PT-pt has RW, OT-  Patient/family involved in discharge planning: PT- Patient,  OT-Patient, SLP-   MD ELOS: 3-4 weeks Medical Rehab Prognosis:  Fair Assessment: Patient is an 85 yr old female with IM nailing due to R subtrochanteric hip fracture, with pAFib, CKD Stage III, diastolic CHF, LE edema, endometrial Cancer, and HTN along with new onset O2 need and wheezing-had rapid response yesterday-  Goals supervision to min assist.    See Team Conference Notes for weekly updates to the plan of care

## 2019-10-01 ENCOUNTER — Inpatient Hospital Stay (HOSPITAL_COMMUNITY): Payer: Medicare Other | Admitting: Physical Therapy

## 2019-10-01 ENCOUNTER — Inpatient Hospital Stay (HOSPITAL_COMMUNITY): Payer: Medicare Other | Admitting: Occupational Therapy

## 2019-10-01 DIAGNOSIS — K5903 Drug induced constipation: Secondary | ICD-10-CM

## 2019-10-01 NOTE — Progress Notes (Signed)
Physical Therapy Session Note  Patient Details  Name: Christina Morgan MRN: [id number] Date of Birth: [birth year]  Today's Date: 10/01/2019 PT Individual Time: 1400-1500 PT Individual Time Calculation (min): 60 min   Short Term Goals: Week 1:  PT Short Term Goal 1 (Week 1): Pt will perform supine<>sit with mod assist PT Short Term Goal 2 (Week 1): Pt will perform sit<>stand from chair height using LRAD with mod assist PT Short Term Goal 3 (Week 1): Pt will perform bed<>chair transfers with max assist PT Short Term Goal 4 (Week 1): Pt wil initiate gait  Skilled Therapeutic Interventions/Progress Updates:    Pt received seated in w/c in room, agreeable to PT session. Pt reports discomfort in RLE, pain not rated and declines intervention. Set pt up with more narrow and shorter w/c for improved fit and comfort. Sit to stand with mod A to RW. Attempt to have pt perform stand pivot transfer to the L, unable to tolerate WBing on RLE to lift LLE to take a step. Stand pivot transfer to the R from w/c to mat table with max A due to onset of fatigue towards end of transfer and max A required to safely sit. Pt requires max cueing for sequencing during transfer. Stand pivot transfer mat table to w/c with RW and mod A, improved ability to take steps to turn and improved tolerance for standing to complete transfer safely. Pt requests to return to bed at end of session. Sit to stand with mod A to stedy. Stedy transfer w/c to bed. Sit to supine mod A for BLE management. Rolling L/R with mod A and use of bedrails to adjust bed linens. Pt left supine in bed with needs in reach at end of session.  Therapy Documentation Precautions:  Precautions Precautions: Fall Restrictions Weight Bearing Restrictions: Yes RLE Weight Bearing: Weight bearing as tolerated    Therapy/Group: Individual Therapy   Excell Seltzer, PT, DPT  10/01/2019, 3:41 PM

## 2019-10-01 NOTE — Progress Notes (Addendum)
Occupational Therapy Session Note  Patient Details  Name: Christina Morgan MRN: [id number] Date of Birth: 02/07/1933  Today's Date: 10/01/2019 OT Individual Time: 0845-1000 OT Individual Time Calculation (min): 75 min    Short Term Goals: Week 1:  OT Short Term Goal 1 (Week 1): Pt will sit to stand wiht MAX A and LRAD in prep for clothing managment OT Short Term Goal 2 (Week 1): Pt will thread BLE with MIN A and AE PRN OT Short Term Goal 3 (Week 1): Pt will sit EOB/EOM for 5 min with S to demo improved sitting balnace OT Short Term Goal 4 (Week 1): Pt will don shirt EOB with S  Skilled Therapeutic Interventions/Progress Updates:    Pt seen for OT ADL bathing/dressing session. Pt awake in supine upon arrival, agreeable to tx session and denying pain at rest, pain not limiting during session. She transferredto sitting EOB with max A, assist for management of R LE off EOB and using hospital bed functions to assist in coming to sit upright. Mod A to stand from elevated bed into STEDY for transfer into w/c.  Completed bathing/dressing routine from w/c level at sink. Set-up/supervision UB bathing/dressing.  PRovided pt with reacher and demonstration/education provided regarding use. She doffed socks with min A using reacher and used LH sponge to wash LEs. Stood at sink x3 trials in total, initially mod A progressing to min A with repetitions. Tolerated standing ~20 seconds each trial with B UE support while therapist provided total A for clothing management and buttock hygiene. Overall max A LB dressing. UB dressing with set-up.  Pt left seated in w/c at end of session, R LE elevated on ELR and all needs in reach.   Session completed on RA, 94% with activity.   Therapy Documentation Precautions:  Precautions Precautions: Fall Restrictions Weight Bearing Restrictions: Yes RLE Weight Bearing: Weight bearing as tolerated   Therapy/Group: Individual Therapy  Demiah Gullickson L 10/01/2019,  6:53 AM

## 2019-10-01 NOTE — Progress Notes (Signed)
Physical Therapy Session Note  Patient Details  Name: Christina Morgan MRN: [id number] Date of Birth: 09/29/1933  Today's Date: 10/01/2019 PT Individual Time: 1100-1157 PT Individual Time Calculation (min): 57 min   Short Term Goals: Week 1:  PT Short Term Goal 1 (Week 1): Pt will perform supine<>sit with mod assist PT Short Term Goal 2 (Week 1): Pt will perform sit<>stand from chair height using LRAD with mod assist PT Short Term Goal 3 (Week 1): Pt will perform bed<>chair transfers with max assist PT Short Term Goal 4 (Week 1): Pt wil initiate gait  Skilled Therapeutic Interventions/Progress Updates:  Pt received in w/c & agreeable to tx. Pt reports need to use restroom and transfers sit<>stand from w/c and elevated seat over [facility] with stedy lift & cuing for technique. Therapist provides total assist for clothing management & peri hygiene after pt has continent void & BM on toilet. Pt reports dizziness when standing for peri hygiene and requires seated rest break with pt reporting dizziness decreased. Pt returned to w/c & performed hand hygiene & combing hair at sink from w/c level with set up assist. Transported pt to/from gym via w/c dependent assist for time management. Pt transfers sit<>stand with RW & mod assist with max instructional cuing for hand placement and technique. Pt able to stand 3x for max of ~45 seconds with task focusing on RLE weight bearing for strengthening. Pt attempts to lift RLE as if marching and weight shift to R with BLE feet on floor as pt stands with L lateral lean preference. At end of session pt left in w/c with chair alarm donned & all needs in reach.  During session therapist adjusted RLE ELR for increased comfort when sitting in w/c.  Therapy Documentation Precautions:  Precautions Precautions: Fall Restrictions Weight Bearing Restrictions: Yes RLE Weight Bearing: Weight bearing as tolerated  Pain: Pt c/o unrated pain in RLE & buttocks, as well as  cramping sensation but declines asking for pain medication & rest breaks provided PRN.    Therapy/Group: Individual Therapy  Waunita Schooner 10/01/2019, 12:08 PM

## 2019-10-01 NOTE — Progress Notes (Signed)
[facility] PHYSICAL MEDICINE & REHABILITATION PROGRESS NOTE  Subjective/Complaints: No new issues this morning. Still on O2. Denies sob  ROS: Patient denies fever, rash, sore throat, blurred vision, nausea, vomiting, diarrhea, cough, shortness of breath or chest pain, joint or back pain, headache, or mood change. .  Objective: Vital Signs: Blood pressure (!) 147/62, pulse 64, temperature 98.4 F (36.9 C), temperature source Oral, resp. rate 17, weight 83 kg, SpO2 99 %. Dg Chest 1 View  Result Date: 09/29/2019 CLINICAL DATA:  Shortness of breath. EXAM: CHEST  1 VIEW COMPARISON:  September 23, 2019 FINDINGS: There is a small left pleural effusion with underlying atelectasis. The heart, hila, mediastinum, lungs, and pleura otherwise unchanged. IMPRESSION: Small stable left pleural effusion with atelectasis. No other change. Electronically Signed   By: Dorise Bullion III M.D   On: 09/29/2019 14:13   Vas [place] Lower Extremity Venous (dvt)  Result Date: 09/29/2019  Lower Venous Study Indications: Acute SOB.  Risk Factors: Closed displaced intertrochanteric fracture of right femur: status post IM nailing 10/13. CHF, lymphedema, Paroxysmal atrial fibrillation Limitations: Significant edema (pitting in right calf). Comparison Study: Prior negative study from 03/30/2018 is available for                   comparison Performing Technologist: Sharion Dove RVS  Examination Guidelines: A complete evaluation includes B-mode imaging, spectral Doppler, color Doppler, and power Doppler as needed of all accessible portions of each vessel. Bilateral testing is considered an integral part of a complete examination. Limited examinations for reoccurring indications may be performed as noted.  +---------+---------------+---------+-----------+----------+--------------+ RIGHT    CompressibilityPhasicitySpontaneityPropertiesThrombus Aging +---------+---------------+---------+-----------+----------+--------------+ CFV       Full           Yes      Yes                                 +---------+---------------+---------+-----------+----------+--------------+ SFJ      Full                                                        +---------+---------------+---------+-----------+----------+--------------+ FV Prox  Full                                                        +---------+---------------+---------+-----------+----------+--------------+ FV Mid   Full                                                        +---------+---------------+---------+-----------+----------+--------------+ FV DistalFull                                                        +---------+---------------+---------+-----------+----------+--------------+ PFV      Full                                                        +---------+---------------+---------+-----------+----------+--------------+  POP      Full           Yes      Yes                                 +---------+---------------+---------+-----------+----------+--------------+ PTV      Full                                                        +---------+---------------+---------+-----------+----------+--------------+ PERO     Full                                                        +---------+---------------+---------+-----------+----------+--------------+   +---------+---------------+---------+-----------+----------+--------------+ LEFT     CompressibilityPhasicitySpontaneityPropertiesThrombus Aging +---------+---------------+---------+-----------+----------+--------------+ CFV      Full           Yes      Yes                                 +---------+---------------+---------+-----------+----------+--------------+ SFJ      Full                                                        +---------+---------------+---------+-----------+----------+--------------+ FV Prox  Full                                                         +---------+---------------+---------+-----------+----------+--------------+ FV Mid   Full                                                        +---------+---------------+---------+-----------+----------+--------------+ FV DistalFull                                                        +---------+---------------+---------+-----------+----------+--------------+ PFV      Full                                                        +---------+---------------+---------+-----------+----------+--------------+ POP      Full           Yes      Yes                                 +---------+---------------+---------+-----------+----------+--------------+  PTV      Full                                                        +---------+---------------+---------+-----------+----------+--------------+ PERO     Full                                                        +---------+---------------+---------+-----------+----------+--------------+     Summary: Right: There is no evidence of deep vein thrombosis in the lower extremity. Left: There is no evidence of deep vein thrombosis in the lower extremity.  *See table(s) above for measurements and observations. Electronically signed by Harold Barban MD on 09/29/2019 at 4:34:00 PM.    Final    Recent Labs    09/29/19 1310 09/30/19 1054  WBC 6.6 7.5  HGB 11.4* 10.9*  HCT 34.8* 34.9*  PLT 276 291   Recent Labs    09/29/19 1310 09/30/19 1054  NA 140 140  K 4.0 4.2  CL 107 107  CO2 24 27  GLUCOSE 162* 109*  BUN 22 22  CREATININE 1.08* 1.08*  CALCIUM 8.2* 8.3*    Physical Exam: BP (!) 147/62 (BP Location: Left Arm)   Pulse 64   Temp 98.4 F (36.9 C) (Oral)   Resp 17   Wt 83 kg   SpO2 99%   BMI 29.53 kg/m  Constitutional: No distress . Vital signs reviewed. HEENT: EOMI, oral membranes moist Neck: supple Cardiovascular: RRR without murmur. No JVD    Respiratory: CTA Bilaterally with occ  wheezes. no rales. Normal effort  O2NC GI: BS +, non-tender, non-distended  GI: Non-distended. Skin: Right hip dressing C/D/I Vascular changes bilateral lower extremities Psych: pleasant Musc: Bilateral, right greater than left lower extremity edema Neurological: Alert She follows commands.  Motor:  Left lower extremity: Hip flexion, knee extension 4-/5, ankle dorsiflexion 4/5 Right lower extremity: Hip flexion, knee extension 3-/5, ankle dorsiflexion 3+ to 4-/5   Assessment/Plan: 1. Functional deficits secondary to right subtrochanteric femur fracture which require 3+ hours per day of interdisciplinary therapy in a comprehensive inpatient rehab setting.  Physiatrist is providing close team supervision and 24 hour management of active medical problems listed below.  Physiatrist and rehab team continue to assess barriers to discharge/monitor patient progress toward functional and medical goals  Care Tool:  Bathing  Bathing activity did not occur: Safety/medical concerns Body parts bathed by patient: Right arm, Left arm, Chest, Abdomen, Face   Body parts bathed by helper: Front perineal area, Buttocks, Right lower leg, Left lower leg     Bathing assist Assist Level: Minimal Assistance - Patient > 75%     Upper Body Dressing/Undressing Upper body dressing Upper body dressing/undressing activity did not occur (including orthotics): N/A(patient had a gown on) What is the patient wearing?: Pull over shirt    Upper body assist Assist Level: Minimal Assistance - Patient > 75%    Lower Body Dressing/Undressing Lower body dressing    Lower body dressing activity did not occur: N/A(no incontinent brief on) What is the patient wearing?: Incontinence brief, Pants     Lower body assist Assist for lower body dressing: 2 Helpers  Toileting Toileting    Toileting assist Assist for toileting: Total Assistance - Patient < 25%     Transfers Chair/bed transfer  Transfers  assist  Chair/bed transfer activity did not occur: Safety/medical concerns  Chair/bed transfer assist level: Total Assistance - Patient < 25%     Locomotion Ambulation   Ambulation assist   Ambulation activity did not occur: Safety/medical concerns          Walk 10 feet activity   Assist  Walk 10 feet activity did not occur: Safety/medical concerns        Walk 50 feet activity   Assist Walk 50 feet with 2 turns activity did not occur: Safety/medical concerns         Walk 150 feet activity   Assist Walk 150 feet activity did not occur: Safety/medical concerns         Walk 10 feet on uneven surface  activity   Assist Walk 10 feet on uneven surfaces activity did not occur: Safety/medical concerns         Wheelchair     Assist Will patient use wheelchair at discharge?: No             Wheelchair 50 feet with 2 turns activity    Assist            Wheelchair 150 feet activity     Assist           Medical Problem List and Plan: 1.Decreased functional mobilitysecondary to right subtrochanteric hip fracture. Status post IM nailing with ORIF 09/24/2019.  Continue CIR PT, OT 2. Antithrombotics: -DVT/anticoagulation:Chronic Eliquis -antiplatelet therapy: N/A 3. Pain Management:Hydrocodone as needed  Relatively controlled on 10/18 4. Mood:Provide emotional support -antipsychotic agents: N/A 5. Neuropsych: This patientiscapable of making decisions on herown behalf. 6. Skin/Wound Care:Routine skin checks  Prevalon boots ordered 7. Fluids/Electrolytes/Nutrition:Routine in and outs 8. Acute blood loss anemia.  Hemoglobin 10.9 on 10/19  9. PAF. Continue Eliquis as well as Tambocor 100 mg twice daily, Tenormin 12.5 mg twice daily. Cardiac rate controlled 10. Hyperlipidemia. Lipitor 11. Metabolic bone disease. Continue vitamin D supplements as directed 12. CKD stage III.  Creatinine baseline 1.49.  Creatinine 1.08 10/19 13. Chronic diastolic congestive heart failure monitor for any signs of fluid overload. Followed by cardiology services.Demadex resumed    weight is good.  -f/u renal fxn on Thursday  Filed Weights   09/29/19 0353 09/30/19 0347 10/01/19 0500  Weight: 84.1 kg 83.2 kg 83 kg  14. Lymphedema.She did have a home health nurse to assist in leg wraps when needed -elevate leg in bed/chair -TEDS 15.Endometrial cancer.Patient is followed by Dr. Polly Cobia in [place]. Patient maintained on Megace 80 mg 2 times daily takes for 3 weeks and alternates with Nolvadex20 mg daily for 3 weeks 16.  Essential hypertension  Continue Tenormin 12.5 twice daily  Continue hydralazine 12.5 twice daily, increased to 25 twice daily on 10/18  Monitor with increased mobility 17.  Drug-induced constipation  Bowel meds increased on 10/18  -had two bm's 10/19 18, New onset wheezing--seems improved  10/19-   Duonebs BID x 3 days   10/20--wean oxygen as able--100% this am  LOS: 4 days A FACE TO FACE EVALUATION WAS PERFORMED  Meredith Staggers 10/01/2019, 8:59 AM

## 2019-10-02 ENCOUNTER — Inpatient Hospital Stay (HOSPITAL_COMMUNITY): Payer: Medicare Other

## 2019-10-02 ENCOUNTER — Inpatient Hospital Stay (HOSPITAL_COMMUNITY): Payer: Medicare Other | Admitting: Physical Therapy

## 2019-10-02 ENCOUNTER — Inpatient Hospital Stay (HOSPITAL_COMMUNITY): Payer: Medicare Other | Admitting: Occupational Therapy

## 2019-10-02 NOTE — Progress Notes (Signed)
Occupational Therapy Session Note  Patient Details  Name: Christina Morgan MRN: [id number] Date of Birth: 11-11-1933  Today's Date: 10/02/2019 OT Individual Time: 1000-1100 OT Individual Time Calculation (min): 60 min    Short Term Goals: Week 1:  OT Short Term Goal 1 (Week 1): Pt will sit to stand wiht MAX A and LRAD in prep for clothing managment OT Short Term Goal 2 (Week 1): Pt will thread BLE with MIN A and AE PRN OT Short Term Goal 3 (Week 1): Pt will sit EOB/EOM for 5 min with S to demo improved sitting balnace OT Short Term Goal 4 (Week 1): Pt will don shirt EOB with S  Skilled Therapeutic Interventions/Progress Updates:    PT seen for OT session focusing on ADL re-training and functional standing balance/endurance. Pt asleep in w/c upon arrival, easily awoken but needing increased time for arousal. Pt agreeable to tx session, declining shower. Discussed home bathing/dressing routine, pt plans to sponge bathe initially at d/c, bath aide to assist with bathing and progressing to shower when she is able and declines wanting to address this while in rehab. She dressed seated in w/c. Required review of use of reacher in assisting with LB dressing. Required min A to use reacher to doff B socks and when threading pants up. She stood at [employer] with mod A, min A for balance while therapist provided total A for clothing management.  She completed oral care from w/c level at sink with set-up.  Completed standing trials at RW. Overall mod A with VCs for "rocking" technique and to promote anterior weight shift. While in standing, addressed dynamic standing balance, alternating UE support in order to remove clothes pins placed around her lower shirt in simulation of LB dressing task. Min- occasional mod A for balance. Pt requiring extended seated rest break following each standing trial of ~30 seconds. Assist for controlled descent.  Requested need for toileting task. Sit>stand in STEDY with mod A.  Pt able to complete clothing management and hygiene standing in STEDY with steadying assist. Pt requesting to return to bed at end of session. Mod A to return to supine, left with all needs in reach and bed alarm on.   Therapy Documentation Precautions:  Precautions Precautions: Fall Restrictions Weight Bearing Restrictions: Yes RLE Weight Bearing: Weight bearing as tolerated   Therapy/Group: Individual Therapy  Bertrum Helmstetter L 10/02/2019, 6:58 AM

## 2019-10-02 NOTE — Progress Notes (Signed)
Physical Therapy Session Note  Patient Details  Name: Christina Morgan MRN: [id number] Date of Birth: [birth year]  Today's Date: 10/02/2019 PT Individual Time: 0800-0900 PT Individual Time Calculation (min): 60 min   Short Term Goals: Week 1:  PT Short Term Goal 1 (Week 1): Pt will perform supine<>sit with mod assist PT Short Term Goal 2 (Week 1): Pt will perform sit<>stand from chair height using LRAD with mod assist PT Short Term Goal 3 (Week 1): Pt will perform bed<>chair transfers with max assist PT Short Term Goal 4 (Week 1): Pt wil initiate gait  Skilled Therapeutic Interventions/Progress Updates:     Pt resting in bed.  Maudry Mayhew, RN giving meds.  Pt denied pain at rest, and declined pain meds initially.  PT discussed pain mgt regarding movement in therapy> hip pain, and she then requested pain meds and received them.  Bil LEs noted to be erythematous bil LLs, and moderate/severe edema .  Pt stated that she had been receiving tx for bil LE edema due to CA txs. Maudry Mayhew, RN consulted with DAn, PA about LLs.  PT donned bil thigh high TEDS and non slip socks.    Therapeutic exercisse performed with LEs to increase strength for functional mobility: supine- 25 x 1 alternating ankle pumps; 15 x 1 L heel slides, AA R heel slides, bil hip internal rotation, bil hip abduction/adduction (AA for RLE).  Pt stated that she needed to urinate.  Rolling L using bed features, min assist to bring knees toward chest, and elevate trunk; mod assist overall.  Pt c/o dizziness upon sitting up, which resolved in less than 1 minute.  Use of Stedy to transfer pt to [facility] elevated, over toilet.  Pt continent of B and B.  She tolerated standing x 2 minutes during peri care by PT.  Brief donned with pt in standing.   At end of session, pt in w/c with seat belt alarm set and needs at hand, RLE elevated.   Therapy Documentation Precautions:  Precautions Precautions: Fall Restrictions Weight Bearing Restrictions:  Yes RLE Weight Bearing: Weight bearing as tolerated      Therapy/Group: Individual Therapy  [name] 10/02/2019, 10:21 AM

## 2019-10-02 NOTE — Progress Notes (Signed)
Physical Therapy Session Note  Patient Details  Name: Christina Morgan MRN: [id number] Date of Birth: 1933/09/16  Today's Date: 10/02/2019 PT Individual Time: 1300-1415 PT Individual Time Calculation (min): 75 min   Short Term Goals: Week 1:  PT Short Term Goal 1 (Week 1): Pt will perform supine<>sit with mod assist PT Short Term Goal 2 (Week 1): Pt will perform sit<>stand from chair height using LRAD with mod assist PT Short Term Goal 3 (Week 1): Pt will perform bed<>chair transfers with max assist PT Short Term Goal 4 (Week 1): Pt wil initiate gait  Skilled Therapeutic Interventions/Progress Updates:    Pt received supine in bed, agreeable to PT session. No complaints of pain, reports any pain is well controlled with pain medication. Supine to sit with mod A for BLE management. Sit to stand with mod A to stedy. Stedy transfer to toilet. Pt is dependent for pericare and clothing management, see Flowsheet for details. Manual w/c propulsion x 100 ft with use of BUE and CGA for steering, v/c for technique. Pt requires increased time to complete w/c mobility. Sit to stand with mod A to RW. Ambulation x 5 ft with RW and min A, improved tolerance for WBing on RLE. Ambulation 2 x 10 ft with RW and min A, antalgic gait pattern, decreased stance time on RLE, decreased step length with LLE. Pt's daughter present at end of session, provided home measurement sheet and discussed d/c date, equipment, etc. Per pt's daughter there are STE both the patient's home and dtr's home. Therapy recommending a ramp due to pt's current mobility level so pt's daughter to look into having a ramp built at her house since that is where pt will d/c. Pt left seated in w/c in room with needs in reach at end of session.  Therapy Documentation Precautions:  Precautions Precautions: Fall Restrictions Weight Bearing Restrictions: Yes RLE Weight Bearing: Weight bearing as tolerated    Therapy/Group: Individual  Therapy   Excell Seltzer, PT, DPT  10/02/2019, 3:37 PM

## 2019-10-02 NOTE — Progress Notes (Signed)
RN reported at time conference,that pt.'s bilateral lower extremities are swollen and present some redness.Dan Anguilli PA-C has been notified as well.Keep monitoring pt. Closely and assessing her needs.

## 2019-10-02 NOTE — Care Management (Signed)
[facility] Individual Statement of Services  Patient Name:  Christina Morgan  Date:  10/02/2019  Welcome to the [facility].  Our goal is to provide you with an individualized program based on your diagnosis and situation, designed to meet your specific needs.  With this comprehensive rehabilitation program, you will be expected to participate in at least 3 hours of rehabilitation therapies Monday-Friday, with modified therapy programming on the weekends.  Your rehabilitation program will include the following services:  Physical Therapy (PT), Occupational Therapy (OT), 24 hour per day rehabilitation nursing, Case Management (Social Worker), Rehabilitation Medicine, Nutrition Services and Pharmacy Services  Weekly team conferences will be held on Wednesdays to discuss your progress.  Your Social Worker will talk with you frequently to get your input and to update you on team discussions.  Team conferences with you and your family in attendance may also be held.  Expected length of stay: 3 weeks   Overall anticipated outcome: minimal assistance  Depending on your progress and recovery, your program may change. Your Social Worker will coordinate services and will keep you informed of any changes. Your Social Worker's name and contact numbers are listed  below.  The following services may also be recommended but are not provided by the [facility] will be made to provide these services after discharge if needed.  Arrangements include referral to agencies that provide these services.  Your insurance has been verified to be:  Medicare; [place] Your primary doctor is:  [facility]  Pertinent information will be shared with your doctor and your insurance company.  Social Worker:  [place], [place] or ([phone]    Information discussed with and copy given to patient by: Lennart Pall, 10/02/2019, 10:48 AM

## 2019-10-02 NOTE — Progress Notes (Signed)
Social Work Assessment and Plan   Patient Details  Name: Christina Morgan MRN: [id number] Date of Birth: 10-05-1933  Today's Date: 09/30/2019  Problem List:  Patient Active Problem List   Diagnosis Date Noted  . Drug induced constipation   . Shortness of breath   . Essential hypertension   . Chronic diastolic congestive heart failure ([place])   . Stage 3 chronic kidney disease   . Acute blood loss anemia   . Closed subtrochanteric fracture of hip, right, sequela 09/27/2019  . Traumatic closed trochanteric fracture of femur with minimal displacement, left, with delayed healing, subsequent encounter 09/27/2019  . PAF (paroxysmal atrial fibrillation) ([place])   . Vitamin D deficiency 09/25/2019  . Closed displaced intertrochanteric fracture of right femur ([place])   . Compression of lumbar vertebra ([place])   . Compression fracture of thoracic vertebra ([facility])   . Fall 09/23/2019  . SOB (shortness of breath) 01/04/2019  . Bilateral lower leg cellulitis 03/30/2018  . Chronic diastolic heart failure ([place]) 03/30/2018  . Edema of extremities 08/19/2014  . Essential hypertension, benign 05/15/2014  . Mixed hyperlipidemia 05/15/2014  . Persistent atrial fibrillation ([place]) 05/15/2014  . Endometrial ca [facility]) 12/28/2012   Past Medical History:  Past Medical History:  Diagnosis Date  . Cancer [facility])    endometrial ca  . Chronic diastolic CHF (congestive heart failure) ([place])   . Chronic edema   . CKD (chronic kidney disease), stage III   . Dyslipidemia   . Fracture of left humerus   . History of cardiovascular stress test    Lexiscan Myoview (06/2014): No ischemia or scar, not gated, low risk  . Hx of echocardiogram    Echo (05/02/14): EF 60% to 65%. No regional wall motion abnormalities. Mild AI, mildly dilated aortic root (37 mm), trivial MR, trivial TR  . Hyperlipidemia   . Hypertension   . Lipoma of skin    multiple  . Osteopenia   . PAF (paroxysmal atrial fibrillation) ([place])    failed  DCCV 05/2014 >> Flecainide started >> DCCV 7/15 sucessful;  f/u ETT neg for pro-arrhythmia >> recurrent AF/AFL >> Flecainide inc to 100 bid with repeat DCCV 9/15  . Vitamin D deficiency 09/25/2019   Past Surgical History:  Past Surgical History:  Procedure Laterality Date  . CARDIOVERSION N/A 06/05/2014   Procedure: CARDIOVERSION;  Surgeon: Sueanne Margarita, MD;  Location: [place];  Service: Cardiovascular;  Laterality: N/A;  . CARDIOVERSION N/A 07/11/2014   Procedure: CARDIOVERSION;  Surgeon: Sueanne Margarita, MD;  Location: [facility] ENDOSCOPY;  Service: Cardiovascular;  Laterality: N/A;  . CARDIOVERSION N/A 09/04/2014   Procedure: CARDIOVERSION;  Surgeon: Sueanne Margarita, MD;  Location: [facility] ENDOSCOPY;  Service: Cardiovascular;  Laterality: N/A;  . COLONOSCOPY  2008  . COLONOSCOPY N/A 05/20/2015   Procedure: COLONOSCOPY;  Surgeon: Ronald Lobo, MD;  Location: [facility] ENDOSCOPY;  Service: Endoscopy;  Laterality: N/A;  . FEMUR IM NAIL Right 09/24/2019   Procedure: INTRAMEDULLARY (IM) NAIL FEMORAL;  Surgeon: Altamese Stockbridge, MD;  Location: [place];  Service: Orthopedics;  Laterality: Right;  . IR KYPHO EA ADDL LEVEL THORACIC OR LUMBAR  04/12/2019  . IR KYPHO THORACIC WITH BONE BIOPSY  04/12/2019  . ROBOTIC ASSISTED SUPRACERVICAL HYSTERECTOMY WITH BILATERAL SALPINGO OOPHERECTOMY  11/06/2012   and bilateral pelvic lymph node dissection   Social History:  reports that she has never smoked. She has never used smokeless tobacco. She reports that she does not drink alcohol or use drugs.  Family / [place]  Marital Status: Widow/Widower How Long?: 7 yrs Patient Roles: Parent Children: daughter, Kyliana Standen @ [phone];  son, Trysten Berti @ [phone] Anticipated Caregiver: family and hired caregivers Ability/Limitations of Caregiver: son and daughter pta coming late afternoon 4 to 5 hrs per day and bring dinner Caregiver Availability: 24/7 Family Dynamics: Pt describes family as very supportive.   Denies any concerns about amount of assist available at d/c.  Social History Preferred language: English Religion: [employer] Cultural Background: NA Read: Yes Write: Yes Employment Status: Retired Public relations account executive Issues: None Guardian/Conservator: None - per MD, pt is capable of making decisions on her own behalf.   Abuse/Neglect Abuse/Neglect Assessment Can Be Completed: Yes Physical Abuse: Denies Verbal Abuse: Denies Sexual Abuse: Denies Exploitation of patient/patient's resources: Denies Self-Neglect: Denies  Emotional Status Pt's affect, behavior and adjustment status: Pt lying in bed and reports a lot of fatigue from full day of therapies.  She is hopeful that the estimated LOS of 3 weeks might end up being shorter.  She denies any significant emotional distress.  Will monitor and refer for neuropsychology as indicated. Recent Psychosocial Issues: None Psychiatric History: None Substance Abuse History: none  Patient / Family Perceptions, Expectations & Goals Pt/Family understanding of illness & functional limitations: Pt and family with good, general understanding of her hip fx and WBAT/ precautiong. Premorbid pt/family roles/activities: Pt was independent, however, family does assist with financial matters. Anticipated changes in roles/activities/participation: Per goals of min assist, family will need to increase to 24/7 support. Pt/family expectations/goals: "I just want to be able to do what I can for myself."  [employer]: None Premorbid Home Care/DME Agencies: None Transportation available at discharge: yes Resource referrals recommended: Neuropsychology  Discharge Planning Living Arrangements: Alone Support Systems: Children Type of Residence: Private residence Insurance Resources: [employer], Multimedia programmer (specify) Financial Resources: Radio broadcast assistant Screen Referred: No Living Expenses: Own Money  Management: Family Does the patient have any problems obtaining your medications?: No Home Management: pt and family Patient/Family Preliminary Plans: Pt plans to return to her home with family arranging 24/7 assistance. Social Work Anticipated Follow Up Needs: HH/OP Expected length of stay: 3-4 weeks  Clinical Impression Pleasant, elderly woman here following a fall at home with hip fx.  Making slow progress and LOS anticipated ~ 3 weeks which pt hopes will be shorter.  Good family support, however, need to confirm they can arrange 24/7 given min assist goals.  Pt denies any emotional distress - will monitor.  Andy Moye 09/30/2019, 10:46 AM

## 2019-10-02 NOTE — Progress Notes (Signed)
[facility] PHYSICAL MEDICINE & REHABILITATION PROGRESS NOTE  Subjective/Complaints: No problems. Off oxygen. Pain levels reasonable.   ROS: Patient denies fever, rash, sore throat, blurred vision, nausea, vomiting, diarrhea, cough, shortness of breath or chest pain, joint or back pain, headache, or mood change.  .  Objective: Vital Signs: Blood pressure 136/67, pulse 64, temperature 98.9 F (37.2 C), temperature source Oral, resp. rate 17, weight 82.4 kg, SpO2 96 %. No results found. Recent Labs    09/29/19 1310 09/30/19 1054  WBC 6.6 7.5  HGB 11.4* 10.9*  HCT 34.8* 34.9*  PLT 276 291   Recent Labs    09/29/19 1310 09/30/19 1054  NA 140 140  K 4.0 4.2  CL 107 107  CO2 24 27  GLUCOSE 162* 109*  BUN 22 22  CREATININE 1.08* 1.08*  CALCIUM 8.2* 8.3*    Physical Exam: BP 136/67 (BP Location: Left Arm)   Pulse 64   Temp 98.9 F (37.2 C) (Oral)   Resp 17   Wt 82.4 kg   SpO2 96%   BMI 29.32 kg/m  Constitutional: No distress . Vital signs reviewed. HEENT: EOMI, oral membranes moist Neck: supple Cardiovascular: RRR without murmur. No JVD    Respiratory: CTA Bilaterally without wheezes or rales. Normal effort    GI: BS +, non-tender, non-distended  Skin: Right hip dressings both C/D/I Vascular changes bilateral lower extremities Psych: pleasant Musc: Bilateral, right greater than left lower extremity edema, 1+ Neurological: Alert She follows commands.  Motor:  Left lower extremity: Hip flexion, knee extension 4-/5, ankle dorsiflexion 4/5 Right lower extremity: Hip flexion, knee extension 3-/5, ankle dorsiflexion 3+ to 4-/5---motor exam stable   Assessment/Plan: 1. Functional deficits secondary to right subtrochanteric femur fracture which require 3+ hours per day of interdisciplinary therapy in a comprehensive inpatient rehab setting.  Physiatrist is providing close team supervision and 24 hour management of active medical problems listed below.  Physiatrist  and rehab team continue to assess barriers to discharge/monitor patient progress toward functional and medical goals  Care Tool:  Bathing  Bathing activity did not occur: Safety/medical concerns Body parts bathed by patient: Right arm, Left arm, Chest, Abdomen, Face   Body parts bathed by helper: Front perineal area, Buttocks, Right lower leg, Left lower leg     Bathing assist Assist Level: Minimal Assistance - Patient > 75%     Upper Body Dressing/Undressing Upper body dressing Upper body dressing/undressing activity did not occur (including orthotics): N/A(patient had a gown on) What is the patient wearing?: Pull over shirt    Upper body assist Assist Level: Minimal Assistance - Patient > 75%    Lower Body Dressing/Undressing Lower body dressing    Lower body dressing activity did not occur: N/A(no incontinent brief on) What is the patient wearing?: Incontinence brief, Pants     Lower body assist Assist for lower body dressing: 2 Helpers     Toileting Toileting    Toileting assist Assist for toileting: Total Assistance - Patient < 25%     Transfers Chair/bed transfer  Transfers assist  Chair/bed transfer activity did not occur: Safety/medical concerns  Chair/bed transfer assist level: Maximal Assistance - Patient 25 - 49%     Locomotion Ambulation   Ambulation assist   Ambulation activity did not occur: Safety/medical concerns          Walk 10 feet activity   Assist  Walk 10 feet activity did not occur: Safety/medical concerns        Walk 50  feet activity   Assist Walk 50 feet with 2 turns activity did not occur: Safety/medical concerns         Walk 150 feet activity   Assist Walk 150 feet activity did not occur: Safety/medical concerns         Walk 10 feet on uneven surface  activity   Assist Walk 10 feet on uneven surfaces activity did not occur: Safety/medical concerns         Wheelchair     Assist Will patient  use wheelchair at discharge?: No             Wheelchair 50 feet with 2 turns activity    Assist            Wheelchair 150 feet activity     Assist           Medical Problem List and Plan: 1.Decreased functional mobilitysecondary to right subtrochanteric hip fracture. Status post IM nailing with ORIF 09/24/2019.  Continue CIR PT, OT  -team conf today 2. Antithrombotics: -DVT/anticoagulation:Chronic Eliquis -antiplatelet therapy: N/A 3. Pain Management:Hydrocodone as needed  Relatively controlled on 10/21 4. Mood:Provide emotional support -antipsychotic agents: N/A 5. Neuropsych: This patientiscapable of making decisions on herown behalf. 6. Skin/Wound Care:Routine skin checks  Prevalon boots ordered 7. Fluids/Electrolytes/Nutrition:Routine in and outs 8. Acute blood loss anemia.  Hemoglobin 10.9 on 10/19  9. PAF. Continue Eliquis as well as Tambocor 100 mg twice daily, Tenormin 12.5 mg twice daily. Cardiac rate controlled 10. Hyperlipidemia. Lipitor 11. Metabolic bone disease. Continue vitamin D supplements as directed 12. CKD stage III. Creatinine baseline 1.49.  Creatinine 1.08 10/19 13. Chronic diastolic congestive heart failure monitor for any signs of fluid overload. Followed by cardiology services.Demadex resumed    weight is good.  -f/u renal fxn tomorrow  Filed Weights   09/30/19 0347 10/01/19 0500 10/02/19 0700  Weight: 83.2 kg 83 kg 82.4 kg  14. Lymphedema.She did have a home health nurse to assist in leg wraps when needed -elevate leg in bed/chair -TEDS 15.Endometrial cancer.Patient is followed by Dr. Polly Cobia in [place]. Patient maintained on Megace 80 mg 2 times daily takes for 3 weeks and alternates with Nolvadex20 mg daily for 3 weeks 16.  Essential hypertension  Continue Tenormin 12.5 twice daily  Continue hydralazine 12.5 twice daily,  increased to 25 twice daily on 10/18  Controlled 10/21 17.  Drug-induced constipation  Bowel meds increased on 10/18  -had two bm's 10/19 18, New onset wheezing--seems improved  10/19-   Duonebs BID x 3 days   10/21==off oxygen  LOS: 5 days A FACE TO FACE EVALUATION WAS PERFORMED  Meredith Staggers 10/02/2019, 8:26 AM

## 2019-10-03 ENCOUNTER — Inpatient Hospital Stay (HOSPITAL_COMMUNITY): Payer: Medicare Other

## 2019-10-03 ENCOUNTER — Inpatient Hospital Stay (HOSPITAL_COMMUNITY): Payer: Medicare Other | Admitting: Occupational Therapy

## 2019-10-03 LAB — BASIC METABOLIC PANEL
Anion gap: 9 (ref 5–15)
BUN: 30 mg/dL — ABNORMAL HIGH (ref 8–23)
CO2: 26 mmol/L (ref 22–32)
Calcium: 8.3 mg/dL — ABNORMAL LOW (ref 8.9–10.3)
Chloride: 106 mmol/L (ref 98–111)
Creatinine, Ser: 1.28 mg/dL — ABNORMAL HIGH (ref 0.44–1.00)
GFR calc Af Amer: 44 mL/min — ABNORMAL LOW (ref >60)
GFR calc non Af Amer: 38 mL/min — ABNORMAL LOW (ref >60)
Glucose, Bld: 105 mg/dL — ABNORMAL HIGH (ref 70–99)
Potassium: 4.2 mmol/L (ref 3.5–5.1)
Sodium: 141 mmol/L (ref 135–145)

## 2019-10-03 MED ORDER — TORSEMIDE 20 MG PO TABS: 20.0000 mg | ORAL_TABLET | Freq: Two times a day (BID) | ORAL | Status: DC

## 2019-10-03 NOTE — Progress Notes (Signed)
Occupational Therapy Weekly Progress Note  Patient Details  Name: Christina Morgan MRN: [id number] Date of Birth: 1933/10/31  Beginning of progress report period: September 28, 2019 End of progress report period: October 03, 2019  Today's Date: 10/03/2019 OT Individual Time: 3474-2595 OT Individual Time Calculation (min): 72 min    Patient has met 4 of 4 short term goals.  Pt is making slow but steady progress towards OT goals. She requires min-mod A for sit>stand from w/c and EOB. Cont to use STEDY for functional transfers at this time, however, progressing towards basic stand pivot transfers. Pt using AE for bathing/dressing routine, min A to use AE with VCs required to recall use of device. Pt requires increased time and rest breaks throughout functional tasks 2/2 decreased functional activity tolerance.   Patient continues to demonstrate the following deficits: muscle weakness, decreased cardiorespiratoy endurance and decreased standing balance, decreased postural control and decreased balance strategies and therefore will continue to benefit from skilled OT intervention to enhance overall performance with BADL and Reduce care partner burden.  Patient progressing toward long term goals..  Plan of care revisions: LB dressing goal downgraded to mod A. Using AE for LB dressing, however, due to pt's LE edema has difficulty donning socks/shoes despite use of AE. Marland Kitchen  OT Short Term Goals Week 1:  OT Short Term Goal 1 (Week 1): Pt will sit to stand wiht MAX A and LRAD in prep for clothing managment OT Short Term Goal 1 - Progress (Week 1): Met OT Short Term Goal 2 (Week 1): Pt will thread BLE with MIN A and AE PRN OT Short Term Goal 2 - Progress (Week 1): Met OT Short Term Goal 3 (Week 1): Pt will sit EOB/EOM for 5 min with S to demo improved sitting balnace OT Short Term Goal 3 - Progress (Week 1): Met OT Short Term Goal 4 (Week 1): Pt will don shirt EOB with S OT Short Term Goal 4 - Progress  (Week 1): Met Week 2:  OT Short Term Goal 1 (Week 2): STG=LTG due to LOS  Skilled Therapeutic Interventions/Progress Updates:    Pt seen for OT ADL bathing/dressing session. Pt sitting up in w/c upon arrival, agreeable to tx session and denying pain. Pt agreeable to bathing at shower level today. Utilized STEDY throughout session for functional transfers. Mod-max A to stand into STEDY depending on surface height and level of fatigue. While standing in STEDY, pt completed clothing management and buttock hygiene as part of toileting task with steadying assist. Assist required for hygiene thoroughness following BM.  She bathed seated on TTB in shower, supervision/set-up overall using L sponge to complete LB bathing (buttock hygiene completed prior to transition to shower). Required max A to stand from TTB into STEDY 2/2 fatigue. She dressed seated in w/c. Set-up assist UB dressing. LB dressing completed total A due to fatigue. Strong L lean when standing at RW when attempting to pull pants up. Max A for standing balance and then returned to w/c for safety. +2 obtained for safety on second standing trial, pt standing with min A +2 and clothing management completed total A.  Pt left seated in w/c at end of session, all needs in reach.  Throughout session, pt with strong L lean in sitting and standing. Required VCs to return to midline, pt appearing not to be aware of lean though she reports she has always leaned to L even PTA and hip injury. RN made aware.   Therapy Documentation  Precautions:  Precautions Precautions: Fall Restrictions Weight Bearing Restrictions: Yes RLE Weight Bearing: Weight bearing as tolerated   Therapy/Group: Individual Therapy  Rounds, Amy L 10/03/2019, 7:01 AM

## 2019-10-03 NOTE — Progress Notes (Signed)
Occupational Therapy Session Note  Patient Details  Name: Christina Morgan MRN: [id number] Date of Birth: March 16, 1933  Today's Date: 10/03/2019 OT Individual Time: 1330-1425 OT Individual Time Calculation (min): 55 min    Short Term Goals: Week 1:  OT Short Term Goal 1 (Week 1): Pt will sit to stand wiht MAX A and LRAD in prep for clothing managment OT Short Term Goal 1 - Progress (Week 1): Met OT Short Term Goal 2 (Week 1): Pt will thread BLE with MIN A and AE PRN OT Short Term Goal 2 - Progress (Week 1): Met OT Short Term Goal 3 (Week 1): Pt will sit EOB/EOM for 5 min with S to demo improved sitting balnace OT Short Term Goal 3 - Progress (Week 1): Met OT Short Term Goal 4 (Week 1): Pt will don shirt EOB with S OT Short Term Goal 4 - Progress (Week 1): Met Week 2:  OT Short Term Goal 1 (Week 2): STG=LTG due to LOS  Skilled Therapeutic Interventions/Progress Updates:    1:1 Pt's daughter present and discussed d/c plans, home setup and recommendations including recommendations about bed v lift chair, height of bed, toileting at beside v over the toilet, access to bathroom, access to house via ramp, 24 hr supervision, showering in her shower stall v tub/shower combo with tub bench etc. Also discussed these with primary therapist.   In the gym focus on sit to stands with RW with bilateral UE pushing up (after rocking back and forth)  With min  A to contact guard. Then progressing to stepping forward 3 steps and then backwards for 3 steps. Pt able to complete 4 reps of sit to stands and stepping forwards and backwards with contact guard.   Performed toilet transfer with min A stand pivot transfer with extra time with RW. Toileting with max A.  Returned to bed again with stand pivot transfer with min A with extra time to bed. Mod A for getting both Les in the bed. Elevated LE in bed.   Therapy Documentation Precautions:  Precautions Precautions: Fall Restrictions Weight Bearing  Restrictions: Yes RLE Weight Bearing: Weight bearing as tolerated General:   Vital Signs: Therapy Vitals Temp: 97.9 F (36.6 C) Temp Source: Oral Pulse Rate: 61 Resp: 18 BP: (!) 140/56 Patient Position (if appropriate): Lying Oxygen Therapy SpO2: 97 % O2 Device: Room Air Pain: At end of session reports increased discomfort in right LE- requesting to lay down and elevate LE with pillow   Therapy/Group: Individual Therapy  Willeen Cass [facility] 10/03/2019, 3:38 PM

## 2019-10-03 NOTE — Progress Notes (Addendum)
Physical Therapy Session Note  Patient Details  Name: Christina Morgan MRN: [id number] Date of Birth: 1933-01-21  Today's Date: 10/03/2019 PT Individual Time: 0832-0932 PT Individual Time Calculation (min): 60 min   Short Term Goals: Week 1:  PT Short Term Goal 1 (Week 1): Pt will perform supine<>sit with mod assist PT Short Term Goal 2 (Week 1): Pt will perform sit<>stand from chair height using LRAD with mod assist PT Short Term Goal 3 (Week 1): Pt will perform bed<>chair transfers with max assist PT Short Term Goal 4 (Week 1): Pt wil initiate gait  Skilled Therapeutic Interventions/Progress Updates:    Pt request to use bathroom, but due to urgency requests bedpan and declines attempting to come to [facility] Min/mod assist to roll on and off of bedpan - pt able to bridge to reposition and + void. Pt performed hygiene with set-up for washcloth. Mod assist to come to EOB using bedrail for support to the R with cues for technique. Pt does report "merry go round" feeling with initial supine to sit. Encouraged to find a focus point during transitional movements and maintain eyes open to decrease symptoms. Pt had kept eyes closed. PT donned Tedhose and pants total assist for time management. Focused on sit <> stand to pull up pants (took 2 standing times to fully complete pants). First sit <> stand with min assist and cues for hand placement and facilitation for anterior weightshift and second attempt required mod assist due to poor anterior weightshift. Pt able to maintain balance with min assist and 1 UE support to attempt to pull up pants (needed assist to fully complete). Performed min assist with RW to transfer to w/c with cues for foot placement. Supervision w/c mobility for functional UE strengthening and cardiovascular endurance x 50'. Blocked practice sit < >stand with min to mod assist with focus on technique and anterior weightshift. When pt able to weightshift forward enough, able to perform  with min assist but tendency for posterior bias and requires mod assist to facilitate anteriorly. Pt requires min assist in standing due to L lean due to decreased weightbearing to RLE.During seated rest breaks pt instructed in BLE LAQ and hip flexion (AAROM on R) for functional strengthening and ROM/edema control. Pt able to gait x 5' x 2 trials with RW with decreased stance time on RLE. End of session performed toilet transfers with mod assist with RW due to fatigue and decreased weightbearing on RLE due to fatigue and pain. Pt performed hygiene but requires assist with clothing management. Handoff to NT to get set up in w/c.   Therapy Documentation Precautions:  Precautions Precautions: Fall Restrictions Weight Bearing Restrictions: Yes RLE Weight Bearing: Weight bearing as tolerated  Pain: Pain Assessment Pain Scale: 0-10 Pain Score: 3 (premedicated) Pain Type: Surgical pain Pain Location: Hip Pain Orientation: Right Pain Radiating Towards: right leg Pain Descriptors / Indicators: Sore Pain Frequency: Intermittent Pain Onset: With Activity Pain Intervention(s): Medication (See eMAR)   Therapy/Group: Individual Therapy  Canary Brim Ivory Broad, PT, DPT, CBIS  10/03/2019, 9:49 AM

## 2019-10-03 NOTE — Progress Notes (Signed)
[place] PHYSICAL MEDICINE & REHABILITATION PROGRESS NOTE  Subjective/Complaints: Up with PT. Denies any breathing issues. Pain limiting at times RLE  ROS: Patient denies fever, rash, sore throat, blurred vision, nausea, vomiting, diarrhea, cough, shortness of breath or chest pain,   headache, or mood change.    Objective: Vital Signs: Blood pressure (!) 157/54, pulse 69, temperature 98.6 F (37 C), temperature source Oral, resp. rate 18, weight 83.6 kg, SpO2 96 %. No results found. Recent Labs    09/30/19 1054  WBC 7.5  HGB 10.9*  HCT 34.9*  PLT 291   Recent Labs    09/30/19 1054 10/03/19 0554  NA 140 141  K 4.2 4.2  CL 107 106  CO2 27 26  GLUCOSE 109* 105*  BUN 22 30*  CREATININE 1.08* 1.28*  CALCIUM 8.3* 8.3*    Physical Exam: BP (!) 157/54 (BP Location: Left Arm)   Pulse 69   Temp 98.6 F (37 C) (Oral)   Resp 18   Wt 83.6 kg   SpO2 96%   BMI 29.73 kg/m  Constitutional: No distress . Vital signs reviewed. HEENT: EOMI, oral membranes moist Neck: supple Cardiovascular: RRR without murmur. No JVD    Respiratory: CTA Bilaterally without wheezes or rales. Normal effort    GI: BS +, non-tender, non-distended  Skin: wounds clean/dressed Vascular changes bilateral lower extremities Psych: pleasant Musc: Bilateral, right greater than left lower extremity edema, 1+ Neurological: Alert She follows commands.  Motor:  Left lower extremity: Hip flexion, knee extension 4-/5, ankle dorsiflexion 4/5 Right lower extremity: Hip flexion, knee extension 3-/5, ankle dorsiflexion 3+ to 4-/5---motor exam intact   Assessment/Plan: 1. Functional deficits secondary to right subtrochanteric femur fracture which require 3+ hours per day of interdisciplinary therapy in a comprehensive inpatient rehab setting.  Physiatrist is providing close team supervision and 24 hour management of active medical problems listed below.  Physiatrist and rehab team continue to assess  barriers to discharge/monitor patient progress toward functional and medical goals  Care Tool:  Bathing  Bathing activity did not occur: Safety/medical concerns Body parts bathed by patient: Right arm, Left arm, Chest, Abdomen, Face   Body parts bathed by helper: Front perineal area, Buttocks, Right lower leg, Left lower leg     Bathing assist Assist Level: Minimal Assistance - Patient > 75%     Upper Body Dressing/Undressing Upper body dressing Upper body dressing/undressing activity did not occur (including orthotics): N/A(patient had a gown on) What is the patient wearing?: (P) Pull over shirt    Upper body assist Assist Level: (P) Supervision/Verbal cueing    Lower Body Dressing/Undressing Lower body dressing    Lower body dressing activity did not occur: N/A(no incontinent brief on) What is the patient wearing?: (P) Incontinence brief, Pants     Lower body assist Assist for lower body dressing: (P) Maximal Assistance - Patient 25 - 49%     Toileting Toileting    Toileting assist Assist for toileting: Total Assistance - Patient < 25%     Transfers Chair/bed transfer  Transfers assist  Chair/bed transfer activity did not occur: Safety/medical concerns  Chair/bed transfer assist level: Dependent - mechanical lift     Locomotion Ambulation   Ambulation assist   Ambulation activity did not occur: Safety/medical concerns  Assist level: Minimal Assistance - Patient > 75% Assistive device: Walker-rolling Max distance: 10'   Walk 10 feet activity   Assist  Walk 10 feet activity did not occur: Safety/medical concerns  Assist level: Minimal  Assistance - Patient > 75% Assistive device: Walker-rolling   Walk 50 feet activity   Assist Walk 50 feet with 2 turns activity did not occur: Safety/medical concerns         Walk 150 feet activity   Assist Walk 150 feet activity did not occur: Safety/medical concerns         Walk 10 feet on uneven  surface  activity   Assist Walk 10 feet on uneven surfaces activity did not occur: Safety/medical concerns         Wheelchair     Assist Will patient use wheelchair at discharge?: Yes Type of Wheelchair: Manual    Wheelchair assist level: Contact Guard/Touching assist Max wheelchair distance: 100'    Wheelchair 50 feet with 2 turns activity    Assist        Assist Level: Contact Guard/Touching assist   Wheelchair 150 feet activity     Assist           Medical Problem List and Plan: 1.Decreased functional mobilitysecondary to right subtrochanteric hip fracture. Status post IM nailing with ORIF 09/24/2019.  Continue CIR PT, OT    2. Antithrombotics: -DVT/anticoagulation:Chronic Eliquis -antiplatelet therapy: N/A 3. Pain Management:Hydrocodone as needed  Improving pain control 4. Mood:Provide emotional support -antipsychotic agents: N/A 5. Neuropsych: This patientiscapable of making decisions on herown behalf. 6. Skin/Wound Care:Routine skin checks  Prevalon boots ordered 7. Fluids/Electrolytes/Nutrition:Routine in and outs 8. Acute blood loss anemia.  Hemoglobin 10.9 on 10/19  9. PAF. Continue Eliquis as well as Tambocor 100 mg twice daily, Tenormin 12.5 mg twice daily. Cardiac rate controlled 10. Hyperlipidemia. Lipitor 11. Metabolic bone disease. Continue vitamin D supplements as directed 12. CKD stage III. Creatinine baseline 1.49.  Creatinine 1.08 10/19---1.28 10/22 13. Chronic diastolic congestive heart failure monitor for any signs of fluid overload. Followed by cardiology services.Demadex resumed    weight is good.  -BUN/Cr drifting up---hold demadex for now [facility] Weights   10/01/19 0500 10/02/19 0700 10/03/19 0326  Weight: 83 kg 82.4 kg 83.6 kg  14. Lymphedema.She did have a home health nurse to assist in leg wraps when needed -elevate leg in  bed/chair -TEDS 15.Endometrial cancer.Patient is followed by Dr. Polly Cobia in [place]. Patient maintained on Megace 80 mg 2 times daily takes for 3 weeks and alternates with Nolvadex20 mg daily for 3 weeks 16.  Essential hypertension  Continue Tenormin 12.5 twice daily  Continue hydralazine 12.5 twice daily, increased to 25 twice daily on 10/18  Controlled 10/22 17.  Drug-induced constipation  Bowel meds increased on 10/18  -moving bowels regularly now 18, New onset wheezing--resolved  duoneb prn   off oxygen  LOS: 6 days A FACE TO FACE EVALUATION WAS PERFORMED  Meredith Staggers 10/03/2019, 9:45 AM

## 2019-10-03 NOTE — Evaluation (Signed)
Recreational Therapy Assessment and Plan  Patient Details  Name: Christina Morgan MRN: [id number] Date of Birth: [birth year] Today's Date: 10/03/2019  Rehab Potential: Good ELOS: 3 weeks   Assessment  Problem List:      Patient Active Problem List   Diagnosis Date Noted  . Essential hypertension   . Chronic diastolic congestive heart failure ([place])   . Stage 3 chronic kidney disease   . Acute blood loss anemia   . Closed subtrochanteric fracture of hip, right, sequela 09/27/2019  . Traumatic closed trochanteric fracture of femur with minimal displacement, left, with delayed healing, subsequent encounter 09/27/2019  . PAF (paroxysmal atrial fibrillation) ([place])   . Vitamin D deficiency 09/25/2019  . Closed displaced intertrochanteric fracture of right femur ([place])   . Compression of lumbar vertebra ([place])   . Compression fracture of thoracic vertebra ([facility])   . Fall 09/23/2019  . SOB (shortness of breath) 01/04/2019  . Bilateral lower leg cellulitis 03/30/2018  . Chronic diastolic heart failure ([place]) 03/30/2018  . Edema of extremities 08/19/2014  . Essential hypertension, benign 05/15/2014  . Mixed hyperlipidemia 05/15/2014  . Persistent atrial fibrillation ([place]) 05/15/2014  . Endometrial ca [facility]) 12/28/2012    Past Medical History:      Past Medical History:  Diagnosis Date  . Cancer [facility])    endometrial ca  . Chronic diastolic CHF (congestive heart failure) ([place])   . Chronic edema   . CKD (chronic kidney disease), stage III   . Dyslipidemia   . Fracture of left humerus   . History of cardiovascular stress test    Lexiscan Myoview (06/2014): No ischemia or scar, not gated, low risk  . Hx of echocardiogram    Echo (05/02/14): EF 60% to 65%. No regional wall motion abnormalities. Mild AI, mildly dilated aortic root (37 mm), trivial MR, trivial TR  . Hyperlipidemia   . Hypertension   . Lipoma of skin    multiple  . Osteopenia   . PAF  (paroxysmal atrial fibrillation) ([place])    failed DCCV 05/2014 >> Flecainide started >> DCCV 7/15 sucessful;  f/u ETT neg for pro-arrhythmia >> recurrent AF/AFL >> Flecainide inc to 100 bid with repeat DCCV 9/15  . Vitamin D deficiency 09/25/2019   Past Surgical History:       Past Surgical History:  Procedure Laterality Date  . CARDIOVERSION N/A 06/05/2014   Procedure: CARDIOVERSION;  Surgeon: Sueanne Margarita, MD;  Location: [facility];  Service: Cardiovascular;  Laterality: N/A;  . CARDIOVERSION N/A 07/11/2014   Procedure: CARDIOVERSION;  Surgeon: Sueanne Margarita, MD;  Location[facility] ENDOSCOPY;  Service: Cardiovascular;  Laterality: N/A;  . CARDIOVERSION N/A 09/04/2014   Procedure: CARDIOVERSION;  Surgeon: Sueanne Margarita, MD;  Location: [facility] ENDOSCOPY;  Service: Cardiovascular;  Laterality: N/A;  . COLONOSCOPY  2008  . COLONOSCOPY N/A 05/20/2015   Procedure: COLONOSCOPY;  Surgeon: Ronald Lobo, MD;  Location: [facility] ENDOSCOPY;  Service: Endoscopy;  Laterality: N/A;  . FEMUR IM NAIL Right 09/24/2019   Procedure: INTRAMEDULLARY (IM) NAIL FEMORAL;  Surgeon: Altamese Sorrel, MD;  Location: [place];  Service: Orthopedics;  Laterality: Right;  . IR KYPHO EA ADDL LEVEL THORACIC OR LUMBAR  04/12/2019  . IR KYPHO THORACIC WITH BONE BIOPSY  04/12/2019  . ROBOTIC ASSISTED SUPRACERVICAL HYSTERECTOMY WITH BILATERAL SALPINGO OOPHERECTOMY  11/06/2012   and bilateral pelvic lymph node dissection    Assessment & Plan Clinical Impression: Christina Morgan is an 85 year old right-handed female with history of chronic diastolic congestive heart  failure,endometrial cancer followed by Dr. Polly Cobia in [place] maintained on Megace as well as Nolvadex,chronic back pain with kyphoplasty May 2020, CKD stage III, lymphedema, hypertension, PAF maintained on Eliquis as well as Tambocor. Per chart review patient lives alone. 1 level home 4 steps to entry. Modified independent with a walkerhowever reportedly  sedentary and has had other falls in the past. She has an aide 2 times a week to assist with some ADLs. She does have a daughter in the area who checks on her routinely. Presented 09/23/2019 after reported fall without loss of consciousness. Denies any chest pain or shortness of breath related to fall. Noted BUN 21 creatinine 1.49 hemoglobin 11.4, COVID negative. Echocardiogram 09/24/2019 showed ejection fraction of 70%. X-rays and imaging revealed right subtrochanteric hip fracture. Underwent IM nailing with ORIF 09/24/2019 per Dr. Marcelino Scot. Hospital course pain management weightbearing as tolerated right lower extremity. Chronic Eliquis has been resumed postoperatively. Acute blood loss anemia 7.4 and transfused 2 units packed red blood cells latest hemoglobin 10.9.   Pt presents with decreased activity tolerance, decreased functional mobility, decreased balance Limiting pt's independence with leisure/community pursuits.  Leisure History/Participation Premorbid leisure interest/current participation: Crafts - Knitting/Crocheting;Community - Doctor, hospital - Grocery store;Community - Psychologist, occupational (Comment) Other Leisure Interests: Cooking/Baking;Housework;Television Leisure Participation Style: With Family/Friends Awareness of Community Resources: Excellent Psychosocial / Spiritual Spiritual Interests: Womens'Men's Groups;Church(volunteers at [employer] (prior to [place])) Does patient have pets?: No Social interaction - Mood/Behavior: Cooperative Engineer, drilling for Education?: Yes Strengths/Weaknesses Patient Strengths/Abilities: Willingness to participate;Active premorbidly Patient weaknesses: Physical limitations TR Patient demonstrates impairments in the following area(s): Edema;Endurance;Motor;Pain  Plan Rec Therapy Plan Is patient appropriate for Therapeutic Recreation?: Yes Rehab Potential: Good Treatment times per week: Min 1 TR session >20  minutes during LOS Estimated Length of Stay: 3 weeks TR Treatment/Interventions: Adaptive equipment instruction;Community reintegration;Patient/family education;Therapeutic exercise;1:1 session;Functional mobility training;Balance/vestibular training;Recreation/leisure participation;Therapeutic activities;Wheelchair propulsion/positioning  Recommendations for other services: None   Discharge Criteria: Patient will be discharged from TR if patient refuses treatment 3 consecutive times without medical reason.  If treatment goals not met, if there is a change in medical status, if patient makes no progress towards goals or if patient is discharged from hospital.  The above assessment, treatment plan, treatment alternatives and goals were discussed and mutually agreed upon: by patient  [place] 10/03/2019, 2:45 PM

## 2019-10-03 NOTE — Patient Care Conference (Signed)
Inpatient RehabilitationTeam Conference and Plan of Care Update Date: 10/02/2019   Time: 9:45 AM    Patient Name: Christina Morgan      Medical Record Number: [id number]  Date of Birth: [birth year] Sex: Female         Room/Bed: 4M03C/4M03C-01 Payor Info: Payor: MEDICARE / Plan: MEDICARE PART A AND B / Product Type: *No Product type* /    Admit Date/Time:  09/27/2019  7:21 PM  Primary Diagnosis:  Closed subtrochanteric fracture of hip, right, sequela  Patient Active Problem List   Diagnosis Date Noted  . Drug induced constipation   . Shortness of breath   . Essential hypertension   . Chronic diastolic congestive heart failure ([place])   . Stage 3 chronic kidney disease   . Acute blood loss anemia   . Closed subtrochanteric fracture of hip, right, sequela 09/27/2019  . Traumatic closed trochanteric fracture of femur with minimal displacement, left, with delayed healing, subsequent encounter 09/27/2019  . PAF (paroxysmal atrial fibrillation) ([place])   . Vitamin D deficiency 09/25/2019  . Closed displaced intertrochanteric fracture of right femur ([place])   . Compression of lumbar vertebra ([place])   . Compression fracture of thoracic vertebra ([facility])   . Fall 09/23/2019  . SOB (shortness of breath) 01/04/2019  . Bilateral lower leg cellulitis 03/30/2018  . Chronic diastolic heart failure ([place]) 03/30/2018  . Edema of extremities 08/19/2014  . Essential hypertension, benign 05/15/2014  . Mixed hyperlipidemia 05/15/2014  . Persistent atrial fibrillation ([place]) 05/15/2014  . Endometrial ca ([place]) 12/28/2012    Expected Discharge Date: Expected Discharge Date: 10/12/19  Team Members Present: Physician leading conference: Dr. Delice Lesch Social Worker Present: Lennart Pall, LCSW Nurse Present: Other (comment)(Luz Rosero, RN) PT Present: Excell Seltzer, PT OT Present: Amy Rounds, OT SLP Present: Weston Anna, SLP PPS Coordinator present : Gunnar Fusi, SLP     Current Status/Progress Goal  Weekly Team Focus  Bowel/Bladder   Incontiinent of Bladder/ continent of bowels LBM 10/01/19 , Myralax BID, and has prn meds  Patient will be able to be continent upon discharge  QS/PRN assessmenr, address all issues and concern, initiate time toileting program   Swallow/Nutrition/ Hydration             ADL's   Mod A transfers using STEDY, max A LB dressing mod A LB bathing using AE, Supervision set-up UB bathing/dressing, total A toileting using STEDY  Min A Overall  ADL re-training, functional standing balance/endurance, functional transfers, AE training, family education and d/c planning   Mobility   mod A bed mobility, mod A to stand to RW or stedy, transfer via stedy  min A overall  sit to stand, stand pivot transfers   Communication             Safety/Cognition/ Behavioral Observations            Pain   Patient c/o of bilateral leg pain right > left and surgical right hip pain, medicated with schedule meds and prn meds  < =3  Assess QS / PRN and evaluate effectiveness of med   Skin   s/p rigght hip surgery with multiple bruising and ecchymotic areas, sutures intact, increase swelling to right,left left leg/and surgical area right hip , no drainage,extremely sensative with movement and touch  Right leg incision site with healing and no signs of infection  QS/PRN continue to assess and address changes with medical team    Rehab Goals Patient on target to meet rehab goals:  Yes *See Care Plan and progress notes for long and short-term goals.     Barriers to Discharge  Current Status/Progress Possible Resolutions Date Resolved   Nursing                  PT                    OT                  SLP                SW                Discharge Planning/Teaching Needs:  Home with daughter to stay and provide 24/7 assistance.  May arrange private duty to cover some hours as well.  Teaching to be completed closer to d/c.   Team Discussion:  Weaning from O2;  Monitor weights  closely.  Increased swelling in right leg and nsg concerned for cellulitis - MD/ PA made aware.  incont of uring at night occ.  Steady progress;  Min-mod to stand.  Mod A bed mobility and mod-max tfs.  Planning for min a w/c level goals overall.  Revisions to Treatment Plan:  NA    Medical Summary Current Status: Decreased functional mobility secondary to right subtrochanteric hip fracture. Weekly Focus/Goal: Improve mobility, edema, SOB, HTN, pain  Barriers to Discharge: Medical stability;Other (comments)  Barriers to Discharge Comments: Lympedema Possible Resolutions to Barriers: Therapies, monitor resp fx, optimize BP meds   Continued Need for Acute Rehabilitation Level of Care: The patient requires daily medical management by a physician with specialized training in physical medicine and rehabilitation for the following reasons: Direction of a multidisciplinary physical rehabilitation program to maximize functional independence : Yes Medical management of patient stability for increased activity during participation in an intensive rehabilitation regime.: Yes Analysis of laboratory values and/or radiology reports with any subsequent need for medication adjustment and/or medical intervention. : Yes   I attest that I was present, lead the team conference, and concur with the assessment and plan of the team.   Lennart Pall 10/03/2019, 3:23 PM   Team conference was held via web/ teleconference due to [place] - 19

## 2019-10-04 ENCOUNTER — Inpatient Hospital Stay (HOSPITAL_COMMUNITY): Payer: Medicare Other | Admitting: Physical Therapy

## 2019-10-04 ENCOUNTER — Inpatient Hospital Stay (HOSPITAL_COMMUNITY): Payer: Medicare Other | Admitting: Occupational Therapy

## 2019-10-04 LAB — BASIC METABOLIC PANEL
Anion gap: 7 (ref 5–15)
BUN: 24 mg/dL — ABNORMAL HIGH (ref 8–23)
CO2: 25 mmol/L (ref 22–32)
Calcium: 8.5 mg/dL — ABNORMAL LOW (ref 8.9–10.3)
Chloride: 108 mmol/L (ref 98–111)
Creatinine, Ser: 1.23 mg/dL — ABNORMAL HIGH (ref 0.44–1.00)
GFR calc Af Amer: 47 mL/min — ABNORMAL LOW (ref >60)
GFR calc non Af Amer: 40 mL/min — ABNORMAL LOW (ref >60)
Glucose, Bld: 101 mg/dL — ABNORMAL HIGH (ref 70–99)
Potassium: 4.4 mmol/L (ref 3.5–5.1)
Sodium: 140 mmol/L (ref 135–145)

## 2019-10-04 NOTE — Progress Notes (Addendum)
Occupational Therapy Session Note  Patient Details  Name: Christina Morgan MRN: [id number] Date of Birth: Jun 14, 1933  Today's Date: 10/04/2019 OT Individual Time: 5852-7782 and 1145-1200 OT Individual Time Calculation (min): 55 min and 15 min   Short Term Goals: Week 2:  OT Short Term Goal 1 (Week 2): STG=LTG due to LOS  Skilled Therapeutic Interventions/Progress Updates:    Pt seen for OT ADL session focusing on ADL re-training and functional transfers. Pt sitting up awake in bed upon arrival, agreeable to tx session. COmplaints of unrated R hip pain,reports being pre-medicated prior to tx session and pain is subsiding. She transferred to sitting EOB with min A using hospital bed functions and multi-modal cuing for sequencing/technique. INitially min A for sitting balance EOB due to posterior bias, however, with VCs to promote anterior weightshift improved to supervision while completing UB dressing. Pants donned seated EOB with assist to thread R LE. PT able to thread L LE. She stood from slightly elevated EOB with min-mod A and with mod A for standing balance while pt alternated UE support while pulling pants up. Completed stand pivot transfer to w/c with min A and increased time with VCs for RW management.  In therapy gym, completed dynamic standing task from EOM using RW. Pt required to cross midline and reach to place items on target. Completed with steadying assist overall, tolerating ~1-2 minutes in standing each trial before requiring seated rest break. Pt with CGA for controlled descent back onto mat with VCs throughout for hand placement on RW during sit<>stand.  She transitioned back to w/c and taken back to room. Ambulated ~20ft from w/c to recliner with min A. Pt left seated in recliner at end of session, LEs elevated and all needs in reach with RN present.   Session Two: Pt seen for OT session focusing on caregiver education and d/c planning. Pt sitting up in w/c with daughter  present. Daughter provided home measurements of pt's home and daughter's home as well as bathroom photos. Education provided regarding pros/cons of various bathroom options (tub/shower vs. Walk in shower), activity progression, CLOF and DME. Provided with hand out of TTB if they plan to private purchase. Will practice simulated tub/shower transfer utilizing tub bench prior to d/c to assess potential use.  Also discussed use of standard bed vs. Lift chair for sleeping needs. Pt with high bed, however, with box spring removed bed would be too low in order for pt to stand from it. Recommend lift chair in order to decrease burden of care and discussed reducing reliance of lift function on chair. Provided with hand out for rental ramp.  Pt left seated in w/c at end of session. Pt and daughter appreciative of education.   Therapy Documentation Precautions:  Precautions Precautions: Fall Restrictions Weight Bearing Restrictions: Yes RLE Weight Bearing: Weight bearing as tolerated   Therapy/Group: Individual Therapy  Brena Windsor L 10/04/2019, 7:05 AM

## 2019-10-04 NOTE — Progress Notes (Signed)
Physical Therapy Session Note  Patient Details  Name: Christina Morgan MRN: [id number] Date of Birth: [birth year]  Today's Date: 10/04/2019 PT Individual Time: 1000-1100 PT Individual Time Calculation (min): 60 min   Short Term Goals: Week 1:  PT Short Term Goal 1 (Week 1): Pt will perform supine<>sit with mod assist PT Short Term Goal 2 (Week 1): Pt will perform sit<>stand from chair height using LRAD with mod assist PT Short Term Goal 3 (Week 1): Pt will perform bed<>chair transfers with max assist PT Short Term Goal 4 (Week 1): Pt wil initiate gait  Skilled Therapeutic Interventions/Progress Updates:    Pt received seated in recliner, agreeable to PT session. No complaints of pain. Pt is mod A to stand to RW from recliner. Stand pivot transfer recliner to w/c with RW and min A. Manual w/c propulsion x 150 ft with use of BUE and Supervision. Stand pivot transfer w/c to mat table with RW and min A. Sit to stand with min A to RW progressing to CGA throughout session. Standing RLE therex: hip flex, hip abd, hip ext, HS curl. Standing alt L/R marches x 10 reps with fair tolerance for WBing on RLE; standing alt L/R target taps with BUE support on RW and min A for balance. Standing balance while performing horseshoe toss with one UE support on RW and CGA for balance. Pt exhibits good standing balance with one UE support. Ambulation x 10 ft with RW and min A, decreased step length with LLE and decreased stance time on RLE. Stand pivot transfer w/c to recliner. Pt left seated in recliner, ice pack to R hip for pain management at end of session.  Therapy Documentation Precautions:  Precautions Precautions: Fall Restrictions Weight Bearing Restrictions: Yes RLE Weight Bearing: Weight bearing as tolerated    Therapy/Group: Individual Therapy   Excell Seltzer, PT, DPT  10/04/2019, 12:22 PM

## 2019-10-04 NOTE — Progress Notes (Signed)
Physical Therapy Session Note  Patient Details  Name: Christina Morgan MRN: [id number] Date of Birth: 11-Oct-1933  Today's Date: 10/04/2019 PT Individual Time: 1300-1415 PT Individual Time Calculation (min): 75 min   Short Term Goals: Week 1:  PT Short Term Goal 1 (Week 1): Pt will perform supine<>sit with mod assist PT Short Term Goal 2 (Week 1): Pt will perform sit<>stand from chair height using LRAD with mod assist PT Short Term Goal 3 (Week 1): Pt will perform bed<>chair transfers with max assist PT Short Term Goal 4 (Week 1): Pt wil initiate gait  Skilled Therapeutic Interventions/Progress Updates:  Pt received sitting upright in recliner with daughter present in the room. Pt icing R hip. Pt agreeable to treatment. Pt transferred with modA and RW from recliner>wheelchair. Pt transported to therapy gym for treatment. Pt ambulated 15 feet x2 with RW and minA. Pt took seated rest breaks in between bouts of gait. Then, pt completed sit>stand with RW and modA. Pt did 2x8 taps with R LE to 2 inch box and 2x5 taps with L LE to target yellow dot on floor. Pt required verbal and tactile cueing for weightshifting throughout task. Pt took seated rest break d/t fatigue. While sitting EOB, pt completed 2x8 bilat LAQ. Pt transferred from EOB>wheelchair with RW and modA. Pt transported to smaller therapy gym. Pt participated in DynaVision programs as follows: 1 minute, 3 ringsx2, then 2 minute, endurance where pt was only able to complete 1.5/2 minutes. Pt required modA during standing and required verbal cueing for increased BOS during Dynavision. Pt returned to wheelchair and transported back to room. Pt's daughter still present. Pt expressed wanting to return to bed. Pt completed sit>stand transfer with RW and modA from wheelchair>bed. Pt positioned in bed, bed exit on, and call bell within reach. All needs met at this time.     Therapy Documentation Precautions:  Precautions Precautions:  Fall Restrictions Weight Bearing Restrictions: Yes RLE Weight Bearing: Weight bearing as tolerated Therapy Vitals Temp: 97.7 F (36.5 C) Temp Source: Oral Pulse Rate: 63 Resp: 18 BP: (!) 142/60 Patient Position (if appropriate): Sitting Oxygen Therapy SpO2: 99 % O2 Device: Room Air Pain: Pt reports pain in R hip.     Therapy/Group: Individual Therapy  Olena Leatherwood, SPT  10/04/2019, 3:28 PM

## 2019-10-04 NOTE — Progress Notes (Signed)
[facility] PHYSICAL MEDICINE & REHABILITATION PROGRESS NOTE  Subjective/Complaints: Had a good morning with OT. Moved right leg quite well.   ROS: Patient denies fever, rash, sore throat, blurred vision, nausea, vomiting, diarrhea, cough, shortness of breath or chest pain, joint or back pain, headache, or mood change.   Objective: Vital Signs: Blood pressure (!) 146/49, pulse 63, temperature 98.3 F (36.8 C), resp. rate 18, weight 80.9 kg, SpO2 98 %. No results found. No results for input(s): WBC, HGB, HCT, PLT in the last 72 hours. Recent Labs    10/03/19 0554 10/04/19 0459  NA 141 140  K 4.2 4.4  CL 106 108  CO2 26 25  GLUCOSE 105* 101*  BUN 30* 24*  CREATININE 1.28* 1.23*  CALCIUM 8.3* 8.5*    Physical Exam: BP (!) 146/49 (BP Location: Right Arm)   Pulse 63   Temp 98.3 F (36.8 C)   Resp 18   Wt 80.9 kg   SpO2 98%   BMI 28.79 kg/m  Constitutional: No distress . Vital signs reviewed. HEENT: EOMI, oral membranes moist Neck: supple Cardiovascular: RRR without murmur. No JVD    Respiratory: CTA Bilaterally without wheezes or rales. Normal effort    GI: BS +, non-tender, non-distended   Skin: wounds clean/dressed Vascular changes bilateral lower extremities. Psych: pleasant Musc: Bilateral, right LE 1+ Neurological: Alert Appropriate cognitively.  Motor:  Left lower extremity: Hip flexion, knee extension 4-/5, ankle dorsiflexion 4/5 Right lower extremity: Hip flexion, knee extension 3-/5, ankle dorsiflexion 4/5.   Assessment/Plan: 1. Functional deficits secondary to right subtrochanteric femur fracture which require 3+ hours per day of interdisciplinary therapy in a comprehensive inpatient rehab setting.  Physiatrist is providing close team supervision and 24 hour management of active medical problems listed below.  Physiatrist and rehab team continue to assess barriers to discharge/monitor patient progress toward functional and medical goals  Care  Tool:  Bathing  Bathing activity did not occur: Safety/medical concerns Body parts bathed by patient: Right arm, Left arm, Chest, Abdomen, Face, Front perineal area, Right upper leg, Left upper leg, Right lower leg, Left lower leg(LH Sponge)   Body parts bathed by helper: Buttocks     Bathing assist Assist Level: Minimal Assistance - Patient > 75%     Upper Body Dressing/Undressing Upper body dressing Upper body dressing/undressing activity did not occur (including orthotics): N/A(patient had a gown on) What is the patient wearing?: Pull over shirt    Upper body assist Assist Level: Supervision/Verbal cueing    Lower Body Dressing/Undressing Lower body dressing    Lower body dressing activity did not occur: N/A(no incontinent brief on) What is the patient wearing?: Pants     Lower body assist Assist for lower body dressing: Moderate Assistance - Patient 50 - 74%     Toileting Toileting    Toileting assist Assist for toileting: Maximal Assistance - Patient 25 - 49%(Using SETDY)     Transfers Chair/bed transfer  Transfers assist  Chair/bed transfer activity did not occur: Safety/medical concerns  Chair/bed transfer assist level: Minimal Assistance - Patient > 75%     Locomotion Ambulation   Ambulation assist   Ambulation activity did not occur: Safety/medical concerns  Assist level: Minimal Assistance - Patient > 75% Assistive device: Walker-rolling Max distance: 5'   Walk 10 feet activity   Assist  Walk 10 feet activity did not occur: Safety/medical concerns  Assist level: Minimal Assistance - Patient > 75% Assistive device: Walker-rolling   Walk 50 feet activity  Assist Walk 50 feet with 2 turns activity did not occur: Safety/medical concerns         Walk 150 feet activity   Assist Walk 150 feet activity did not occur: Safety/medical concerns         Walk 10 feet on uneven surface  activity   Assist Walk 10 feet on uneven surfaces  activity did not occur: Safety/medical concerns         Wheelchair     Assist Will patient use wheelchair at discharge?: Yes Type of Wheelchair: Manual    Wheelchair assist level: Supervision/Verbal cueing Max wheelchair distance: 53'    Wheelchair 50 feet with 2 turns activity    Assist        Assist Level: Supervision/Verbal cueing   Wheelchair 150 feet activity     Assist           Medical Problem List and Plan: 1.Decreased functional mobilitysecondary to right subtrochanteric hip fracture. Status post IM nailing with ORIF 09/24/2019.  Continue CIR PT, OT    2. Antithrombotics: -DVT/anticoagulation:Chronic Eliquis -antiplatelet therapy: N/A 3. Pain Management:Hydrocodone as needed  Improving pain control 4. Mood:Provide emotional support -antipsychotic agents: N/A 5. Neuropsych: This patientiscapable of making decisions on herown behalf. 6. Skin/Wound Care:continue local care to wounds  Prevalon boots ordered 7. Fluids/Electrolytes/Nutrition:Routine in and outs 8. Acute blood loss anemia.  Hemoglobin 10.9 on 10/19  9. PAF. Continue Eliquis as well as Tambocor 100 mg twice daily, Tenormin 12.5 mg twice daily. Cardiac rate controlled 10. Hyperlipidemia. Lipitor 11. Metabolic bone disease. Continue vitamin D supplements as directed 12. CKD stage III. Creatinine baseline 1.49.  Creatinine 1.08 10/19---1.23 10/23 13. Chronic diastolic congestive heart failure monitor for any signs of fluid overload. Followed by cardiology services.Demadex resumed    weight is good.  -BUN/Cr improved today. Continue to hold demadex Filed Weights   10/02/19 0700 10/03/19 0326 10/04/19 0600  Weight: 82.4 kg 83.6 kg 80.9 kg  14. Lymphedema.She did have a home health nurse to assist in leg wraps when needed -elevate leg in bed/chair -TEDS  -LLE looks good, no edema at present. I  would resume wrapping as needed as an outpt. RLE more edematous at present d/t her fx/surgery 15.Endometrial cancer.Patient is followed by Dr. Polly Cobia in [place]. Patient maintained on Megace 80 mg 2 times daily takes for 3 weeks and alternates with Nolvadex20 mg daily for 3 weeks 16.  Essential hypertension  Continue Tenormin 12.5 twice daily  Continue hydralazine 12.5 twice daily, increased to 25 twice daily on 10/18  Controlled 10/23 17.  Drug-induced constipation  Bowel meds increased on 10/18  -moving bowels regularly now 18, New onset wheezing--resolved  duoneb prn     LOS: 7 days A FACE TO FACE EVALUATION WAS PERFORMED  Meredith Staggers 10/04/2019, 9:51 AM

## 2019-10-04 NOTE — Progress Notes (Signed)
Social Work Patient ID: [name], female   DOB: 16-Dec-1932, 85 y.o.   MRN: [id number]  Met with pt and daughter to review team conference.  Both aware and agreeable with targeted d/c date of 10/31 and min assist w/c level goals overall.  Pt smiling and reports a good day in therapy.  Hopeful she can reach a higher goal level than anticipated.  Will continue to follow.  Yafet Cline, LCSW

## 2019-10-05 ENCOUNTER — Inpatient Hospital Stay (HOSPITAL_COMMUNITY): Payer: Medicare Other

## 2019-10-05 MED ORDER — TORSEMIDE 20 MG PO TABS
20.0000 mg | ORAL_TABLET | Freq: Every day | ORAL | Status: DC
  Administered 2019-10-06 – 2019-10-10 (×5): 20 mg via ORAL
  Filled 2019-10-05 (×5): qty 1

## 2019-10-05 NOTE — Progress Notes (Signed)
[facility] PHYSICAL MEDICINE & REHABILITATION PROGRESS NOTE  Subjective/Complaints: Slept very well last night. Pain improving.   ROS: Patient denies fever, rash, sore throat, blurred vision, nausea, vomiting, diarrhea, cough, shortness of breath or chest pain,   headache, or mood change.    Objective: Vital Signs: Blood pressure (!) 161/52, pulse 62, temperature 98.5 F (36.9 C), temperature source Oral, resp. rate 20, weight 81.9 kg, SpO2 96 %. No results found. No results for input(s): WBC, HGB, HCT, PLT in the last 72 hours. Recent Labs    10/03/19 0554 10/04/19 0459  NA 141 140  K 4.2 4.4  CL 106 108  CO2 26 25  GLUCOSE 105* 101*  BUN 30* 24*  CREATININE 1.28* 1.23*  CALCIUM 8.3* 8.5*    Physical Exam: BP (!) 161/52 (BP Location: Left Arm)   Pulse 62   Temp 98.5 F (36.9 C) (Oral)   Resp 20   Wt 81.9 kg   SpO2 96%   BMI 29.14 kg/m  Constitutional: No distress . Vital signs reviewed. HEENT: EOMI, oral membranes moist Neck: supple Cardiovascular: RRR without murmur. No JVD    Respiratory: CTA Bilaterally without wheezes or rales. Normal effort    GI: BS +, non-tender, non-distended  Skin: wounds clean with sturues Vascular changes bilateral lower extremities--stable Psych: pleasant Musc: Bilateral, right LE 1+ Neurological: Alert Appropriate cognitively.  Motor:  Left lower extremity: Hip flexion, knee extension 4-/5, ankle dorsiflexion 4/5 Right lower extremity: Hip flexion, knee extension 3-/5, ankle dorsiflexion 4/5.   Assessment/Plan: 1. Functional deficits secondary to right subtrochanteric femur fracture which require 3+ hours per day of interdisciplinary therapy in a comprehensive inpatient rehab setting.  Physiatrist is providing close team supervision and 24 hour management of active medical problems listed below.  Physiatrist and rehab team continue to assess barriers to discharge/monitor patient progress toward functional and medical  goals  Care Tool:  Bathing  Bathing activity did not occur: Safety/medical concerns Body parts bathed by patient: Right arm, Left arm, Chest, Abdomen, Face, Front perineal area, Right upper leg, Left upper leg, Right lower leg, Left lower leg(LH Sponge)   Body parts bathed by helper: Buttocks     Bathing assist Assist Level: Minimal Assistance - Patient > 75%     Upper Body Dressing/Undressing Upper body dressing Upper body dressing/undressing activity did not occur (including orthotics): N/A(patient had a gown on) What is the patient wearing?: Pull over shirt    Upper body assist Assist Level: Supervision/Verbal cueing    Lower Body Dressing/Undressing Lower body dressing    Lower body dressing activity did not occur: N/A(no incontinent brief on) What is the patient wearing?: Pants     Lower body assist Assist for lower body dressing: Moderate Assistance - Patient 50 - 74%     Toileting Toileting    Toileting assist Assist for toileting: Maximal Assistance - Patient 25 - 49%(Using SETDY)     Transfers Chair/bed transfer  Transfers assist  Chair/bed transfer activity did not occur: Safety/medical concerns  Chair/bed transfer assist level: Minimal Assistance - Patient > 75%     Locomotion Ambulation   Ambulation assist   Ambulation activity did not occur: Safety/medical concerns  Assist level: Minimal Assistance - Patient > 75% Assistive device: Walker-rolling Max distance: 15 feet   Walk 10 feet activity   Assist  Walk 10 feet activity did not occur: Safety/medical concerns  Assist level: Minimal Assistance - Patient > 75% Assistive device: Walker-rolling   Walk 50 feet activity  Assist Walk 50 feet with 2 turns activity did not occur: Safety/medical concerns         Walk 150 feet activity   Assist Walk 150 feet activity did not occur: Safety/medical concerns         Walk 10 feet on uneven surface  activity   Assist Walk 10 feet  on uneven surfaces activity did not occur: Safety/medical concerns         Wheelchair     Assist Will patient use wheelchair at discharge?: Yes Type of Wheelchair: Manual    Wheelchair assist level: Supervision/Verbal cueing Max wheelchair distance: 150'    Wheelchair 50 feet with 2 turns activity    Assist        Assist Level: Supervision/Verbal cueing   Wheelchair 150 feet activity     Assist     Assist Level: Supervision/Verbal cueing     Medical Problem List and Plan: 1.Decreased functional mobilitysecondary to right subtrochanteric hip fracture. Status post IM nailing with ORIF 09/24/2019.  Continue CIR PT, OT    2. Antithrombotics: -DVT/anticoagulation:Chronic Eliquis -antiplatelet therapy: N/A 3. Pain Management:Hydrocodone as needed  Improving pain control 4. Mood:Provide emotional support -antipsychotic agents: N/A 5. Neuropsych: This patientiscapable of making decisions on herown behalf. 6. Skin/Wound Care:remove sutures today  Prevalon boots ordered 7. Fluids/Electrolytes/Nutrition:Routine in and outs 8. Acute blood loss anemia.  Hemoglobin 10.9 on 10/19  9. PAF. Continue Eliquis as well as Tambocor 100 mg twice daily, Tenormin 12.5 mg twice daily. Cardiac rate controlled 10. Hyperlipidemia. Lipitor 11. Metabolic bone disease. Continue vitamin D supplements as directed 12. CKD stage III. Creatinine baseline 1.49.  Creatinine  1.23 10/23 13. Chronic diastolic congestive heart failure monitor for any signs of fluid overload. Followed by cardiology services.Demadex resumed    weight is good.  -BUN/Cr improved.    -resume demadex today Filed Weights   10/03/19 0326 10/04/19 0600 10/05/19 0444  Weight: 83.6 kg 80.9 kg 81.9 kg  14. Lymphedema.She did have a home health nurse to assist in leg wraps when needed -elevate leg in bed/chair -TEDS  -LLE  looks good, no edema at present. I would resume wrapping as needed as an outpt. RLE more edematous at present d/t her recent fx/surgery 15.Endometrial cancer.Patient is followed by Dr. Polly Cobia in [place]. Patient maintained on Megace 80 mg 2 times daily takes for 3 weeks and alternates with Nolvadex20 mg daily for 3 weeks 16.  Essential hypertension  Continue Tenormin 12.5 twice daily  Continue hydralazine 12.5 twice daily, increased to 25 twice daily on 10/18  Controlled 10/23 17.  Drug-induced constipation  Bowel meds increased on 10/18  -moving bowels regularly now 18, New onset wheezing--resolved  duoneb prn     LOS: 8 days A FACE TO FACE EVALUATION WAS PERFORMED  Meredith Staggers 10/05/2019, 8:23 AM

## 2019-10-05 NOTE — Plan of Care (Signed)
  Problem: Consults Goal: RH GENERAL PATIENT EDUCATION Description: See Patient Education module for education specifics. Outcome: Progressing Goal: Skin Care Protocol Initiated - if Braden Score 18 or less Description: If consults are not indicated, leave blank or document N/A Outcome: Progressing   Problem: RH BOWEL ELIMINATION Goal: RH STG MANAGE BOWEL WITH ASSISTANCE Description: STG Manage Bowel with mod I Assistance. Outcome: Progressing   Problem: RH BLADDER ELIMINATION Goal: RH STG MANAGE BLADDER WITH ASSISTANCE Description: STG Manage Bladder With mod I Assistance Outcome: Progressing   Problem: RH SKIN INTEGRITY Goal: RH STG SKIN FREE OF INFECTION/BREAKDOWN Outcome: Progressing Goal: RH STG MAINTAIN SKIN INTEGRITY WITH ASSISTANCE Description: STG Maintain Skin Integrity With mod I Assistance. Outcome: Progressing Goal: RH STG ABLE TO PERFORM INCISION/WOUND CARE W/ASSISTANCE Description: STG Able To Perform Incision/Wound Care With mod I Assistance. Outcome: Progressing   Problem: RH SAFETY Goal: RH STG ADHERE TO SAFETY PRECAUTIONS W/ASSISTANCE/DEVICE Description: STG Adhere to Safety Precautions With min Assistance/Device. Outcome: Progressing   Problem: RH PAIN MANAGEMENT Goal: RH STG PAIN MANAGED AT OR BELOW PT'S PAIN GOAL Description: Pain scale <4/10 Outcome: Progressing   Problem: RH KNOWLEDGE DEFICIT GENERAL Goal: RH STG INCREASE KNOWLEDGE OF SELF CARE AFTER HOSPITALIZATION Outcome: Progressing

## 2019-10-05 NOTE — Progress Notes (Signed)
Physical Therapy Weekly Progress Note  Patient Details  Name: Christina Morgan MRN: [id number] Date of Birth: 1933-08-04  Beginning of progress report period: September 28, 2019 End of progress report period: October 05, 2019  Today's Date: 10/05/2019    Patient has met 4 of 4 short term goals.  Pt is making good functional progress towards overall long term goals. Pt still inconsistent with sit <> stands requiring min to mod assist occasionally and decreased standing tolerance/weightbearing through RLE. Gait is progressing with RW for very short distances up to about 15' max. Pt unable to perform stairs but family is planning to have ramp installed prior to d/c.   Patient continues to demonstrate the following deficits muscle weakness and muscle joint tightness, decreased cardiorespiratoy endurance and decreased standing balance and decreased balance strategies and therefore will continue to benefit from skilled PT intervention to increase functional independence with mobility.  Patient progressing toward long term goals..  Continue plan of care.  PT Short Term Goals Week 1:  PT Short Term Goal 1 (Week 1): Pt will perform supine<>sit with mod assist PT Short Term Goal 1 - Progress (Week 1): Met PT Short Term Goal 2 (Week 1): Pt will perform sit<>stand from chair height using LRAD with mod assist PT Short Term Goal 2 - Progress (Week 1): Met PT Short Term Goal 3 (Week 1): Pt will perform bed<>chair transfers with max assist PT Short Term Goal 3 - Progress (Week 1): Met PT Short Term Goal 4 (Week 1): Pt wil initiate gait PT Short Term Goal 4 - Progress (Week 1): Met Week 2:  PT Short Term Goal 1 (Week 2): = LTGs due to ELOS  Skilled Therapeutic Interventions/Progress Updates:  Ambulation/gait training;Community reintegration;DME/adaptive equipment instruction;Neuromuscular re-education;Psychosocial support;Stair training;UE/LE Strength taining/ROM;Wheelchair  propulsion/positioning;Balance/vestibular training;Discharge planning;Functional electrical stimulation;Pain management;Skin care/wound management;Therapeutic Activities;UE/LE Coordination activities;Cognitive remediation/compensation;Disease management/prevention;Functional mobility training;Patient/family education;Splinting/orthotics;Therapeutic Exercise;Visual/perceptual remediation/compensation   Therapy Documentation Precautions:  Precautions Precautions: Fall Restrictions Weight Bearing Restrictions: Yes RLE Weight Bearing: Weight bearing as tolerated   Canary Brim Ivory Broad, PT, DPT, CBIS  10/05/2019, 7:57 AM

## 2019-10-05 NOTE — Progress Notes (Signed)
Physical Therapy Session Note  Patient Details  Name: Christina Morgan MRN: [id number] Date of Birth: 1933-09-19  Today's Date: 10/05/2019 PT Individual Time: 7680-8811 PT Individual Time Calculation (min): 48 min   Short Term Goals: Week 1:  PT Short Term Goal 1 (Week 1): Pt will perform supine<>sit with mod assist PT Short Term Goal 1 - Progress (Week 1): Met PT Short Term Goal 2 (Week 1): Pt will perform sit<>stand from chair height using LRAD with mod assist PT Short Term Goal 2 - Progress (Week 1): Met PT Short Term Goal 3 (Week 1): Pt will perform bed<>chair transfers with max assist PT Short Term Goal 3 - Progress (Week 1): Met PT Short Term Goal 4 (Week 1): Pt wil initiate gait PT Short Term Goal 4 - Progress (Week 1): Met  Skilled Therapeutic Interventions/Progress Updates:    Discussed d/c plan and pt reports family is planning to build a ramp that will hopefully be done before d/c next weekend. Attempted 3" step (in case ramp is not done, what access would be feasible), but pt unable to perform due to weakness in RLE and increased pain. Instructed in RLE strengthening exercises including standing hip abduction, standing marches, seated LAQ, and isometric hip adduction (with 10 sec hold)x 10 reps each. Sit <> stands throughout session with min assist overall with RW except 2 epsiodes of mod assist needed as fatigued and pt with decreased anterior weightshift. Requires cues for hand placement and technique. Gait training with RW x 5' and x 15' with min assist with decreased stance time on R, decreased step length bilaterally, and flexed posture. Pt propels w/c x 100' with BUE for functional strengthening and cardiovascular endurance as well as mobility training. End of session returned back to bed with min assist for stand pivot transfer and min assist to return to supine for RLE management. Bridged position to scoot up in bed. All needs in reach. Pt reports being disappointed she was  unable to perform the stairs and "couldn't do as much today" - encouragement provided and education provided that she had more opportunities this week to progress.   Therapy Documentation Precautions:  Precautions Precautions: Fall Restrictions Weight Bearing Restrictions: Yes RLE Weight Bearing: Weight bearing as tolerated  Pain:  Premedicated for pain in R hip. Did not rate. Recommended using ice after therapies.   Therapy/Group: Individual Therapy  Canary Brim Ivory Broad, PT, DPT, CBIS  10/05/2019, 3:07 PM

## 2019-10-05 NOTE — Progress Notes (Signed)
Occupational Therapy Session Note  Patient Details  Name: JAXSYN CATALFAMO MRN: [id number] Date of Birth: [birth year]  Today's Date: 10/05/2019 OT Individual Time: 1050-1200 OT Individual Time Calculation (min): 70 min    Short Term Goals: Week 2:  OT Short Term Goal 1 (Week 2): STG=LTG due to LOS  Skilled Therapeutic Interventions/Progress Updates:    1:1. Pt received in bed reporting not feeling well, but agreeable to OT. Pt completes supine>sitting with A for trunk eelvation and mod-max A for balance and scooting EOB initially. Pt completes SPT with RW and mIN A from elevated surface  With VC for equal weight shifting and reciprocal scooting back into chair. Pt bathes UB with A for back while seated in sink. Pt declines bahting peri area and buttocks as NT just changed brief. Pt requires VC for reacher use and CGA for guiding to push socks off of feet. OT dons teds and pt uses reacher with VC for placing reacher on pockets of pants to thread BLE into pants. Pt sit to stand at sink with CGA for standing and MOD A for advancing pants past hips. Pt completes oral care seated at sink with S.   Therapy Documentation Precautions:  Precautions Precautions: Fall Restrictions Weight Bearing Restrictions: Yes RLE Weight Bearing: Weight bearing as tolerated General:   Vital Signs:  Pain: Pain Assessment Pain Scale: 0-10 Pain Score: 0-No pain Pain Type: Surgical pain Pain Location: Leg Pain Orientation: Right Pain Descriptors / Indicators: Aching Pain Onset: With Activity Pain Intervention(s): Medication (See eMAR)(prior to therapy) ADL:   Vision   Perception    Praxis   Exercises:   Other Treatments:     Therapy/Group: Individual Therapy  Tonny Branch 10/05/2019, 11:07 AM

## 2019-10-06 ENCOUNTER — Inpatient Hospital Stay (HOSPITAL_COMMUNITY): Payer: Medicare Other

## 2019-10-06 MED ORDER — ATENOLOL 25 MG PO TABS
25.0000 mg | ORAL_TABLET | Freq: Two times a day (BID) | ORAL | Status: DC
  Administered 2019-10-06 – 2019-10-12 (×11): 25 mg via ORAL
  Filled 2019-10-06 (×12): qty 1

## 2019-10-06 NOTE — Plan of Care (Signed)
  Problem: Consults Goal: RH GENERAL PATIENT EDUCATION Description: See Patient Education module for education specifics. Outcome: Progressing Goal: Skin Care Protocol Initiated - if Braden Score 18 or less Description: If consults are not indicated, leave blank or document N/A Outcome: Progressing   Problem: RH BOWEL ELIMINATION Goal: RH STG MANAGE BOWEL WITH ASSISTANCE Description: STG Manage Bowel with mod I Assistance. Outcome: Progressing   Problem: RH BLADDER ELIMINATION Goal: RH STG MANAGE BLADDER WITH ASSISTANCE Description: STG Manage Bladder With mod I Assistance Outcome: Progressing   Problem: RH SKIN INTEGRITY Goal: RH STG SKIN FREE OF INFECTION/BREAKDOWN Outcome: Progressing Goal: RH STG MAINTAIN SKIN INTEGRITY WITH ASSISTANCE Description: STG Maintain Skin Integrity With mod I Assistance. Outcome: Progressing Goal: RH STG ABLE TO PERFORM INCISION/WOUND CARE W/ASSISTANCE Description: STG Able To Perform Incision/Wound Care With mod I Assistance. Outcome: Progressing   Problem: RH SAFETY Goal: RH STG ADHERE TO SAFETY PRECAUTIONS W/ASSISTANCE/DEVICE Description: STG Adhere to Safety Precautions With min Assistance/Device. Outcome: Progressing   Problem: RH PAIN MANAGEMENT Goal: RH STG PAIN MANAGED AT OR BELOW PT'S PAIN GOAL Description: Pain scale <4/10 Outcome: Progressing   Problem: RH KNOWLEDGE DEFICIT GENERAL Goal: RH STG INCREASE KNOWLEDGE OF SELF CARE AFTER HOSPITALIZATION Outcome: Progressing

## 2019-10-06 NOTE — Progress Notes (Addendum)
[place] PHYSICAL MEDICINE & REHABILITATION PROGRESS NOTE  Subjective/Complaints: No new issues. Slept well overnight, sleeping when I arrived.   ROS: Patient denies fever, rash, sore throat, blurred vision, nausea, vomiting, diarrhea, cough, shortness of breath or chest pain, joint or back pain, headache, or mood change.   Objective: Vital Signs: Blood pressure (!) 165/59, pulse 62, temperature 98 F (36.7 C), temperature source Oral, resp. rate 16, weight 79.9 kg, SpO2 97 %. No results found. No results for input(s): WBC, HGB, HCT, PLT in the last 72 hours. Recent Labs    10/04/19 0459  NA 140  K 4.4  CL 108  CO2 25  GLUCOSE 101*  BUN 24*  CREATININE 1.23*  CALCIUM 8.5*    Physical Exam: BP (!) 165/59 (BP Location: Right Arm)   Pulse 62   Temp 98 F (36.7 C) (Oral)   Resp 16   Wt 79.9 kg   SpO2 97%   BMI 28.43 kg/m  Constitutional: No distress . Vital signs reviewed. HEENT: EOMI, oral membranes moist Neck: supple Cardiovascular: RRR without murmur. No JVD    Respiratory: CTA Bilaterally without wheezes or rales. Normal effort    GI: BS +, non-tender, non-distended   Skin: wounds clean with sturues Vascular changes bilateral lower extremities--stable Psych: pleasant Musc: Bilateral, right LE 1+ Neurological: Alert Appropriate cognitively.  Motor:  Left lower extremity: Hip flexion, knee extension 4-/5, ankle dorsiflexion 4/5 Right lower extremity: Hip flexion, knee extension 3-/5, ankle dorsiflexion 4/5.   Assessment/Plan: 1. Functional deficits secondary to right subtrochanteric femur fracture which require 3+ hours per day of interdisciplinary therapy in a comprehensive inpatient rehab setting.  Physiatrist is providing close team supervision and 24 hour management of active medical problems listed below.  Physiatrist and rehab team continue to assess barriers to discharge/monitor patient progress toward functional and medical goals  Care  Tool:  Bathing  Bathing activity did not occur: Safety/medical concerns Body parts bathed by patient: Right arm, Left arm, Chest, Abdomen, Face, Front perineal area, Right upper leg, Left upper leg, Right lower leg, Left lower leg   Body parts bathed by helper: Buttocks     Bathing assist Assist Level: Minimal Assistance - Patient > 75%     Upper Body Dressing/Undressing Upper body dressing Upper body dressing/undressing activity did not occur (including orthotics): N/A(patient had a gown on) What is the patient wearing?: Pull over shirt    Upper body assist Assist Level: Supervision/Verbal cueing    Lower Body Dressing/Undressing Lower body dressing    Lower body dressing activity did not occur: N/A(no incontinent brief on) What is the patient wearing?: Pants     Lower body assist Assist for lower body dressing: Minimal Assistance - Patient > 75%     Toileting Toileting    Toileting assist Assist for toileting: Maximal Assistance - Patient 25 - 49%(Using SETDY)     Transfers Chair/bed transfer  Transfers assist  Chair/bed transfer activity did not occur: Safety/medical concerns  Chair/bed transfer assist level: Minimal Assistance - Patient > 75%     Locomotion Ambulation   Ambulation assist   Ambulation activity did not occur: Safety/medical concerns  Assist level: Minimal Assistance - Patient > 75% Assistive device: Walker-rolling Max distance: 15 feet   Walk 10 feet activity   Assist  Walk 10 feet activity did not occur: Safety/medical concerns  Assist level: Minimal Assistance - Patient > 75% Assistive device: Walker-rolling   Walk 50 feet activity   Assist Walk 50 feet with  2 turns activity did not occur: Safety/medical concerns         Walk 150 feet activity   Assist Walk 150 feet activity did not occur: Safety/medical concerns         Walk 10 feet on uneven surface  activity   Assist Walk 10 feet on uneven surfaces activity  did not occur: Safety/medical concerns         Wheelchair     Assist Will patient use wheelchair at discharge?: Yes Type of Wheelchair: Manual    Wheelchair assist level: Supervision/Verbal cueing Max wheelchair distance: 100'    Wheelchair 50 feet with 2 turns activity    Assist        Assist Level: Supervision/Verbal cueing   Wheelchair 150 feet activity     Assist     Assist Level: Supervision/Verbal cueing     Medical Problem List and Plan: 1.Decreased functional mobilitysecondary to right subtrochanteric hip fracture. Status post IM nailing with ORIF 09/24/2019.  Continue CIR PT, OT    2. Antithrombotics: -DVT/anticoagulation:Chronic Eliquis -antiplatelet therapy: N/A 3. Pain Management:Hydrocodone as needed  Improving pain control 4. Mood:Provide emotional support -antipsychotic agents: N/A 5. Neuropsych: This patientiscapable of making decisions on herown behalf. 6. Skin/Wound Care:remove sutures today  Prevalon boots ordered 7. Fluids/Electrolytes/Nutrition:Routine in and outs 8. Acute blood loss anemia.  Hemoglobin 10.9 on 10/19  9. PAF. Continue Eliquis as well as Tambocor 100 mg twice daily, Tenormin 12.5 mg twice daily. Cardiac rate controlled 10. Hyperlipidemia. Lipitor 11. Metabolic bone disease. Continue vitamin D supplements as directed 12. CKD stage III. Creatinine baseline 1.49.  Creatinine  1.23 10/23 13. Chronic diastolic congestive heart failure monitor for any signs of fluid overload. Followed by cardiology services.Demadex resumed    weight is good.  -BUN/Cr improved.    -resumed demadex 10/24 Filed Weights   10/04/19 0600 10/05/19 0444 10/06/19 0325  Weight: 80.9 kg 81.9 kg 79.9 kg  14. Lymphedema.She did have a home health nurse to assist in leg wraps when needed -elevate leg in bed/chair -TEDS  -LLE looks good, no edema at  present. I would resume wrapping as needed as an outpt. RLE more edematous at present d/t her recent fx/surgery 15.Endometrial cancer.Patient is followed by Dr. Polly Cobia in [place]. Patient maintained on Megace 80 mg 2 times daily takes for 3 weeks and alternates with Nolvadex20 mg daily for 3 weeks 16.  Essential hypertension   Tenormin 12.5 twice twice daily  Continue hydralazine 12.5 twice daily, increased to 25 twice daily on 10/18  10/25 SBP's remain elevated---increase tenormin to 25mg  twice daily 17.  Drug-induced constipation  Bowel meds increased on 10/18  -moving bowels regularly now 18, New onset wheezing--resolved  duoneb prn     LOS: 9 days A FACE TO FACE EVALUATION WAS PERFORMED  Meredith Staggers 10/06/2019, 8:05 AM

## 2019-10-06 NOTE — Progress Notes (Signed)
Physical Therapy Session Note  Patient Details  Name: Christina Morgan MRN: [id number] Date of Birth: 04-24-1933  Today's Date: 10/06/2019 PT Individual Time: 6073-7106 PT Individual Time Calculation (min): 54 min   Short Term Goals: Week 2:  PT Short Term Goal 1 (Week 2): = LTGs due to ELOS  Skilled Therapeutic Interventions/Progress Updates:    Pt seated in recliner upon PT arrival, agreeable to therapy tx and reports pain 2/10 in R LE, repositioned and rest breaks for pain relief. Pt transferred sit>stand with mod assist and transferred to w/c with min assist. Pt performed w/c propulsion x120 ft with B UEs and supervision, increased time to complete and increased work of breathing. Pt ambulated x 5 ft to the therapy mat with min assist and RW, antalgic gait pattern secondary to R LE fx and pain. Pt transferred to supine with mod assist for LE management, in supine pt with increased work of breathing, therapist added wedge behind pt for for upright position. Pt performed LE therex this session as follows, 2 x 10 of each with cues for techniques and form: L LE- heel slides, SLRs, hip abduction, hip flexion R LE- active assisted heel slides, active assisted SLRs, active assisted hip abduction, active assisted hip flexion Pt with increased work of breathing during exercises, on room air throughout session, SpO2 >97% throughout.  In supine pt performed chest press 2 x 10 and bicep curls 2 x 10 with 5# dowel for UE strengthening. Supine>sit with mod assist and transferred to w/c with RW and min assist. Pt transported back to room, sit>stand mod assist with RW and then transfer to bed with RW and min assist. Sit>supine mod assist, left with needs in reach and bed alarm set.   Therapy Documentation Precautions:  Precautions Precautions: Fall Restrictions Weight Bearing Restrictions: Yes RLE Weight Bearing: Weight bearing as tolerated   Therapy/Group: Individual Therapy  Netta Corrigan,  PT, DPT, CSRS 10/06/2019, 12:14 PM

## 2019-10-07 ENCOUNTER — Inpatient Hospital Stay (HOSPITAL_COMMUNITY): Payer: Medicare Other | Admitting: Occupational Therapy

## 2019-10-07 ENCOUNTER — Inpatient Hospital Stay (HOSPITAL_COMMUNITY): Payer: Medicare Other | Admitting: Physical Therapy

## 2019-10-07 LAB — CBC
HCT: 34.8 % — ABNORMAL LOW (ref 36.0–46.0)
Hemoglobin: 11 g/dL — ABNORMAL LOW (ref 12.0–15.0)
MCH: 31.1 pg (ref 26.0–34.0)
MCHC: 31.6 g/dL (ref 30.0–36.0)
MCV: 98.3 fL (ref 80.0–100.0)
Platelets: 370 10*3/uL (ref 150–400)
RBC: 3.54 MIL/uL — ABNORMAL LOW (ref 3.87–5.11)
RDW: 14.8 % (ref 11.5–15.5)
WBC: 6.6 10*3/uL (ref 4.0–10.5)
nRBC: 0 % (ref 0.0–0.2)

## 2019-10-07 LAB — BASIC METABOLIC PANEL
Anion gap: 8 (ref 5–15)
BUN: 18 mg/dL (ref 8–23)
CO2: 22 mmol/L (ref 22–32)
Calcium: 8.6 mg/dL — ABNORMAL LOW (ref 8.9–10.3)
Chloride: 111 mmol/L (ref 98–111)
Creatinine, Ser: 1.08 mg/dL — ABNORMAL HIGH (ref 0.44–1.00)
GFR calc Af Amer: 55 mL/min — ABNORMAL LOW (ref >60)
GFR calc non Af Amer: 47 mL/min — ABNORMAL LOW (ref >60)
Glucose, Bld: 91 mg/dL (ref 70–99)
Potassium: 4.3 mmol/L (ref 3.5–5.1)
Sodium: 141 mmol/L (ref 135–145)

## 2019-10-07 MED ORDER — ALBUTEROL SULFATE HFA 108 (90 BASE) MCG/ACT IN AERS
2.0000 | INHALATION_SPRAY | RESPIRATORY_TRACT | Status: DC | PRN
  Filled 2019-10-07: qty 6.7

## 2019-10-07 NOTE — Progress Notes (Signed)
[place] PHYSICAL MEDICINE & REHABILITATION PROGRESS NOTE  Subjective/Complaints:   Pt reports pain better overall- hasn't taken pain meds since Saturday when couldn't remember when it was when daughter came to visit- scared her.  Also felt SOB since breakfast this AM.   ROS: Patient denies fever, rash, sore throat, blurred vision, nausea, vomiting, diarrhea, cough,  chest pain, joint or back pain, headache, or mood change.    Objective: Vital Signs: Blood pressure (!) 160/61, pulse (!) 59, temperature 97.9 F (36.6 C), temperature source Oral, resp. rate 20, weight 78.8 kg, SpO2 95 %. No results found. Recent Labs    10/07/19 0802  WBC 6.6  HGB 11.0*  HCT 34.8*  PLT 370   Recent Labs    10/07/19 0802  NA 141  K 4.3  CL 111  CO2 22  GLUCOSE 91  BUN 18  CREATININE 1.08*  CALCIUM 8.6*    Physical Exam: BP (!) 160/61 (BP Location: Right Arm)   Pulse (!) 59   Temp 97.9 F (36.6 C) (Oral)   Resp 20   Wt 78.8 kg   SpO2 95%   BMI 28.04 kg/m  Constitutional: No distress . Vital signs and labs reviewed. Awake, alert, at sink in manual w/c, appears SOB/ breathing more deeply; no accessory muscle use HEENT: EOMI, oral membranes moist Neck: supple Cardiovascular: RRR without murmur. No JVD    Respiratory: Increased effort; no acc muscle use; very decreased air movement B/L diffusely; listened after albuterol nebs; much improved air movement; CTA B/L- denied any SOB after neb    GI: BS +, non-tender, non-distended   Skin: wounds clean with sutures; R hip improved edema Vascular changes bilateral lower extremities--stable Psych: pleasant Musc: Bilateral, right LE 1+ Neurological: Alert Appropriate cognitively.  Motor:  Left lower extremity: Hip flexion, knee extension 4-/5, ankle dorsiflexion 4/5 Right lower extremity: Hip flexion, knee extension 3-/5, ankle dorsiflexion 4/5.   Assessment/Plan: 1. Functional deficits secondary to right subtrochanteric femur  fracture which require 3+ hours per day of interdisciplinary therapy in a comprehensive inpatient rehab setting.  Physiatrist is providing close team supervision and 24 hour management of active medical problems listed below.  Physiatrist and rehab team continue to assess barriers to discharge/monitor patient progress toward functional and medical goals  Care Tool:  Bathing  Bathing activity did not occur: Safety/medical concerns Body parts bathed by patient: Right arm, Left arm, Chest, Abdomen(UB only 4/4)   Body parts bathed by helper: Buttocks     Bathing assist Assist Level: Set up assist     Upper Body Dressing/Undressing Upper body dressing Upper body dressing/undressing activity did not occur (including orthotics): N/A(patient had a gown on) What is the patient wearing?: Pull over shirt    Upper body assist Assist Level: Set up assist    Lower Body Dressing/Undressing Lower body dressing    Lower body dressing activity did not occur: N/A(no incontinent brief on) What is the patient wearing?: Pants, Incontinence brief     Lower body assist Assist for lower body dressing: Moderate Assistance - Patient 50 - 74%     Toileting Toileting    Toileting assist Assist for toileting: Maximal Assistance - Patient 25 - 49%     Transfers Chair/bed transfer  Transfers assist  Chair/bed transfer activity did not occur: Safety/medical concerns  Chair/bed transfer assist level: Minimal Assistance - Patient > 75%     Locomotion Ambulation   Ambulation assist   Ambulation activity did not occur: Safety/medical concerns  Assist level: Minimal Assistance - Patient > 75% Assistive device: Walker-rolling Max distance: 5 ft   Walk 10 feet activity   Assist  Walk 10 feet activity did not occur: Safety/medical concerns  Assist level: Minimal Assistance - Patient > 75% Assistive device: Walker-rolling   Walk 50 feet activity   Assist Walk 50 feet with 2 turns  activity did not occur: Safety/medical concerns         Walk 150 feet activity   Assist Walk 150 feet activity did not occur: Safety/medical concerns         Walk 10 feet on uneven surface  activity   Assist Walk 10 feet on uneven surfaces activity did not occur: Safety/medical concerns         Wheelchair     Assist Will patient use wheelchair at discharge?: Yes Type of Wheelchair: Manual    Wheelchair assist level: Supervision/Verbal cueing Max wheelchair distance: 100'    Wheelchair 50 feet with 2 turns activity    Assist        Assist Level: Supervision/Verbal cueing   Wheelchair 150 feet activity     Assist     Assist Level: Supervision/Verbal cueing     Medical Problem List and Plan: 1.Decreased functional mobilitysecondary to right subtrochanteric hip fracture. Status post IM nailing with ORIF 09/24/2019.  Continue CIR PT, OT    2. Antithrombotics: -DVT/anticoagulation:Chronic Eliquis -antiplatelet therapy: N/A 3. Pain Management:Hydrocodone as needed  Improving pain control 4. Mood:Provide emotional support -antipsychotic agents: N/A 5. Neuropsych: This patientiscapable of making decisions on herown behalf. 6. Skin/Wound Care:remove sutures today  Prevalon boots ordered 7. Fluids/Electrolytes/Nutrition:Routine in and outs 8. Acute blood loss anemia.  Hemoglobin 10.9 on 10/19  9. PAF. Continue Eliquis as well as Tambocor 100 mg twice daily, Tenormin 12.5 mg twice daily. Cardiac rate controlled 10. Hyperlipidemia. Lipitor 11. Metabolic bone disease. Continue vitamin D supplements as directed 12. CKD stage III. Creatinine baseline 1.49.  Creatinine  1.23 10/23 13. Chronic diastolic congestive heart failure monitor for any signs of fluid overload. Followed by cardiology services.Demadex resumed    weight is good.  -BUN/Cr improved.    -resumed demadex 10/24 Filed  Weights   10/05/19 0444 10/06/19 0325 10/07/19 0338  Weight: 81.9 kg 79.9 kg 78.8 kg  14. Lymphedema.She did have a home health nurse to assist in leg wraps when needed -elevate leg in bed/chair -TEDS  -LLE looks good, no edema at present. I would resume wrapping as needed as an outpt. RLE more edematous at present d/t her recent fx/surgery 15.Endometrial cancer.Patient is followed by Dr. Polly Cobia in [place]. Patient maintained on Megace 80 mg 2 times daily takes for 3 weeks and alternates with Nolvadex20 mg daily for 3 weeks 16.  Essential hypertension   Tenormin 12.5 twice twice daily  Continue hydralazine 12.5 twice daily, increased to 25 twice daily on 10/18  10/25 SBP's remain elevated---increase tenormin to 25mg  twice daily 17.  Drug-induced constipation  Bowel meds increased on 10/18  -moving bowels regularly now 18, New onset wheezing-  duoneb prn  10/26- required for 3rd time since here- ordered Albuterol inhalers since easier to use in home setting with spacer and ordered 2 puffs q4 hours prn- asked nursing to give next/before nebs.     LOS: 10 days A FACE TO FACE EVALUATION WAS PERFORMED  Aadan Chenier 10/07/2019, 10:54 AM

## 2019-10-07 NOTE — Plan of Care (Signed)
  Problem: Consults Goal: RH GENERAL PATIENT EDUCATION Description: See Patient Education module for education specifics. Outcome: Progressing Goal: Skin Care Protocol Initiated - if Braden Score 18 or less Description: If consults are not indicated, leave blank or document N/A Outcome: Progressing   Problem: RH BOWEL ELIMINATION Goal: RH STG MANAGE BOWEL WITH ASSISTANCE Description: STG Manage Bowel with mod I Assistance. Outcome: Progressing   Problem: RH BLADDER ELIMINATION Goal: RH STG MANAGE BLADDER WITH ASSISTANCE Description: STG Manage Bladder With mod I Assistance Outcome: Progressing   Problem: RH SKIN INTEGRITY Goal: RH STG MAINTAIN SKIN INTEGRITY WITH ASSISTANCE Description: STG Maintain Skin Integrity With mod I Assistance. Outcome: Progressing Goal: RH STG ABLE TO PERFORM INCISION/WOUND CARE W/ASSISTANCE Description: STG Able To Perform Incision/Wound Care With mod I Assistance. Outcome: Progressing   Problem: RH SAFETY Goal: RH STG ADHERE TO SAFETY PRECAUTIONS W/ASSISTANCE/DEVICE Description: STG Adhere to Safety Precautions With min Assistance/Device. Outcome: Progressing   Problem: RH PAIN MANAGEMENT Goal: RH STG PAIN MANAGED AT OR BELOW PT'S PAIN GOAL Description: Pain scale <4/10 Outcome: Progressing

## 2019-10-07 NOTE — Progress Notes (Signed)
  Patient ID: Christina Morgan, female   DOB: 03-14-1933, 85 y.o.   MRN: [id number]  Diagnosis codes:  R49.355E;  I50.32  Height:  5'6"  Weight:   183 lbs    Patient suffers from right gemur fracture and CHF which impairs their ability to perform daily activities like bathing, dressing and toileting in the home.  A walker or cane will not resolve the issues with performing these ADLs.  A high strength lightweight wheelchair will allow patient to safely perform daily activities.  This wheelchair will be used primarily in the home setting.  The patient is not able to propel themselves in the home using a standard or lightweight wheelchair due to arm weakness and endurance.  Patient can self propel in the high strength lightweight wheelchair.  I have personally performed a face to face evaluation of this patient and recommend this wheelchair.  Lauraine Rinne, PA-C

## 2019-10-07 NOTE — Progress Notes (Signed)
Occupational Therapy Session Note  Patient Details  Name: Christina Morgan MRN: [id number] Date of Birth: 21-Apr-1933  Today's Date: 10/07/2019 OT Individual Time: 0800-0903 OT Individual Time Calculation (min): 63 min  and Today's Date: 10/07/2019 OT Missed Time: 10 Minutes Missed Time Reason: Patient fatigue   Short Term Goals: Week 2:  OT Short Term Goal 1 (Week 2): STG=LTG due to LOS  Skilled Therapeutic Interventions/Progress Updates:    Upon entering the room, pt supine in bed with no c/o pain and agreeable to OT intervention. Pt declined toileting this session. Pt standing with min A and transferred into wheelchair with RW. Pt seated in wheelchair at sink for grooming tasks and UB self care with supervision. Pt needing min A for UB dressing and declined to wash LB as she states NT assisted prior to therapist arrival. Pt verbalized urgency for toileting and OT assists pt into bathroom via wheelchair. Stand pivot transfer onto elevated commode chair with min A. Pt needing assistance with hygiene and clothing management. Pt donning clean pants when standing from commode for energy conservation. Pt reporting SOB this session and very fatigued at this point. RT arrives to give breathing treatment during this session as well. Pt requesting to remain seated in wheelchair with chair alarm belt donned for safety. O2 on RA remained above 95% but pt with high perceived exertion. Call bell and all needed items within reach upon exiting the room.    Therapy Documentation Precautions:  Precautions Precautions: Fall Restrictions Weight Bearing Restrictions: Yes RLE Weight Bearing: Weight bearing as tolerated General: General OT Amount of Missed Time: 10 Minutes Vital Signs: Oxygen Therapy SpO2: 95 % O2 Device: Room Air Pain: Pain Assessment Pain Scale: 0-10 Pain Score: 0-No pain   Therapy/Group: Individual Therapy  Gypsy Decant 10/07/2019, 9:07 AM

## 2019-10-07 NOTE — Progress Notes (Signed)
Physical Therapy Session Note  Patient Details  Name: Christina Morgan MRN: [id number] Date of Birth: 10-28-1933  Today's Date: 10/07/2019 PT Individual Time: 1300-1400 PT Individual Time Calculation (min): 60 min   Short Term Goals: Week 2:  PT Short Term Goal 1 (Week 2): = LTGs due to ELOS  Skilled Therapeutic Interventions/Progress Updates:    Pt received semi-reclined in bed, agreeable to PT session. Pt reports some soreness in R hip, not rated and declines intervention. Supine to sit with mod A. Sit to stand with min A to RW, SPT with RW and min A. Attempt simulation of patient's bed at home in simulation apartment, once seated on bed pt states that her bed at home is much firmer and she feels unsafe like she will slide off the edge of the bed. Returned to pt's room to attempt simulation with use of hospital bed. Stand pivot transfer w/c to bed with RW and min A. Simulation of patient's bed height at home 27.5" with use of one bedrail as pt has this available as well. Pt requires mod A for supine to/from sit for BLE management with transfer even with use of bedrail. Pt also has onset of dizziness with position changes that requires seated rest break to recover. Pt reports urge to urinate. Stand pivot transfer bed to/from elevated BSC with min A and RW. Pt is dependent for pericare. Pt requests to return to bed at end of session. Pt left seated in bed with needs in reach setup for lunch.  Therapy Documentation Precautions:  Precautions Precautions: Fall Restrictions Weight Bearing Restrictions: Yes RLE Weight Bearing: Weight bearing as tolerated    Therapy/Group: Individual Therapy   Excell Seltzer, PT, DPT  10/07/2019, 3:39 PM

## 2019-10-07 NOTE — Progress Notes (Signed)
Occupational Therapy Session Note  Patient Details  Name: Christina Morgan MRN: [id number] Date of Birth: 1933/01/15  Today's Date: 10/07/2019 OT Individual Time: 1000-1100 OT Individual Time Calculation (min): 60 min    Short Term Goals: Week 2:  OT Short Term Goal 1 (Week 2): STG=LTG due to LOS  Skilled Therapeutic Interventions/Progress Updates:    Pt seen for OT session focusing on functional mobility and transfers. Pt sitting up in w/c upon arrival, denying pain and agreeable to tx session.   Transfers: Stand pivot transfer w/c<>toilet. Mod A 2/2 L lean with VCs for awareness to LOB.  Simulated tub/shower transfer utilizing TTB- min A with assist for managing R LE over tub wall and max cuing for sequencing/technique.  Sit<>stand from w/c and standard chair with overall supervision and VCs to utilize "rocking" technique and for proper hand placement. Sit>supine: mod A for management of B LEs  ADL re-training: Toileting completed with min-mod steadying assist while pt attempted to assist with clothing management, assist required to thoroughly advance pants/brief up/down.  Grooming tasks from w/c level at sink with set-up.  Pt returned to supine to rest at end of session, all needs in reach with bed alarm on and daughter present. Spoke with daughter regarding d/c planning. Plans for hands on family training scheduled for Wednesday AM.   Therapy Documentation Precautions:  Precautions Precautions: Fall Restrictions Weight Bearing Restrictions: Yes RLE Weight Bearing: Weight bearing as tolerated   Therapy/Group: Individual Therapy  Teofil Maniaci L 10/07/2019, 7:06 AM

## 2019-10-08 ENCOUNTER — Inpatient Hospital Stay (HOSPITAL_COMMUNITY): Payer: Medicare Other | Admitting: Physical Therapy

## 2019-10-08 ENCOUNTER — Inpatient Hospital Stay (HOSPITAL_COMMUNITY): Payer: Medicare Other | Admitting: Occupational Therapy

## 2019-10-08 NOTE — Progress Notes (Signed)
[place] PHYSICAL MEDICINE & REHABILITATION PROGRESS NOTE  Subjective/Complaints:   Pt reports no SOB this AM.    ROS: Patient denies fever, rash, sore throat, blurred vision, nausea, vomiting, diarrhea, cough,  chest pain, joint or back pain, headache, or mood change.    Objective: Vital Signs: Blood pressure (!) 135/58, pulse (!) 58, temperature 98.5 F (36.9 C), temperature source Oral, resp. rate 16, weight 77.3 kg, SpO2 98 %. No results found. Recent Labs    10/07/19 0802  WBC 6.6  HGB 11.0*  HCT 34.8*  PLT 370   Recent Labs    10/07/19 0802  NA 141  K 4.3  CL 111  CO2 22  GLUCOSE 91  BUN 18  CREATININE 1.08*  CALCIUM 8.6*    Physical Exam: BP (!) 135/58   Pulse (!) 58   Temp 98.5 F (36.9 C) (Oral)   Resp 16   Wt 77.3 kg   SpO2 98%   BMI 27.51 kg/m  Constitutional: No distress . Vital signs  reviewed. Awake, alert, sitting up in bed; NAD HEENT: EOMI, oral membranes moist Neck: supple Cardiovascular: RRR without murmur. No JVD    Respiratory: CTA B/L GI: BS +, non-tender, non-distended   Skin: wounds clean with sutures; R hip improved edema Vascular changes bilateral lower extremities--stable Psych: pleasant Musc: Bilateral, right LE 1+ Neurological: Alert Appropriate cognitively.  Motor:  Left lower extremity: Hip flexion, knee extension 4-/5, ankle dorsiflexion 4/5 Right lower extremity: Hip flexion, knee extension 3-/5, ankle dorsiflexion 4/5.   Assessment/Plan: 1. Functional deficits secondary to right subtrochanteric femur fracture which require 3+ hours per day of interdisciplinary therapy in a comprehensive inpatient rehab setting.  Physiatrist is providing close team supervision and 24 hour management of active medical problems listed below.  Physiatrist and rehab team continue to assess barriers to discharge/monitor patient progress toward functional and medical goals  Care Tool:  Bathing  Bathing activity did not occur:  Safety/medical concerns Body parts bathed by patient: Right arm, Left arm, Chest, Abdomen(UB only 4/4)   Body parts bathed by helper: Buttocks     Bathing assist Assist Level: Set up assist     Upper Body Dressing/Undressing Upper body dressing Upper body dressing/undressing activity did not occur (including orthotics): N/A(patient had a gown on) What is the patient wearing?: Pull over shirt    Upper body assist Assist Level: Set up assist    Lower Body Dressing/Undressing Lower body dressing    Lower body dressing activity did not occur: N/A(no incontinent brief on) What is the patient wearing?: Pants, Incontinence brief     Lower body assist Assist for lower body dressing: Moderate Assistance - Patient 50 - 74%     Toileting Toileting    Toileting assist Assist for toileting: Moderate Assistance - Patient 50 - 74%     Transfers Chair/bed transfer  Transfers assist  Chair/bed transfer activity did not occur: Safety/medical concerns  Chair/bed transfer assist level: Minimal Assistance - Patient > 75%     Locomotion Ambulation   Ambulation assist   Ambulation activity did not occur: Safety/medical concerns  Assist level: Minimal Assistance - Patient > 75% Assistive device: Walker-rolling Max distance: 5 ft   Walk 10 feet activity   Assist  Walk 10 feet activity did not occur: Safety/medical concerns  Assist level: Minimal Assistance - Patient > 75% Assistive device: Walker-rolling   Walk 50 feet activity   Assist Walk 50 feet with 2 turns activity did not occur: Safety/medical concerns  Walk 150 feet activity   Assist Walk 150 feet activity did not occur: Safety/medical concerns         Walk 10 feet on uneven surface  activity   Assist Walk 10 feet on uneven surfaces activity did not occur: Safety/medical concerns         Wheelchair     Assist Will patient use wheelchair at discharge?: Yes Type of Wheelchair:  Manual    Wheelchair assist level: Supervision/Verbal cueing Max wheelchair distance: 100'    Wheelchair 50 feet with 2 turns activity    Assist        Assist Level: Supervision/Verbal cueing   Wheelchair 150 feet activity     Assist     Assist Level: Supervision/Verbal cueing     Medical Problem List and Plan: 1.Decreased functional mobilitysecondary to right subtrochanteric hip fracture. Status post IM nailing with ORIF 09/24/2019.  Continue CIR PT, OT    2. Antithrombotics: -DVT/anticoagulation:Chronic Eliquis -antiplatelet therapy: N/A 3. Pain Management:Hydrocodone as needed  Improving pain control 4. Mood:Provide emotional support -antipsychotic agents: N/A 5. Neuropsych: This patientiscapable of making decisions on herown behalf. 6. Skin/Wound Care:remove sutures today  Prevalon boots ordered 7. Fluids/Electrolytes/Nutrition:Routine in and outs 8. Acute blood loss anemia.  Hemoglobin 10.9 on 10/19  9. PAF. Continue Eliquis as well as Tambocor 100 mg twice daily, Tenormin 12.5 mg twice daily. Cardiac rate controlled 10. Hyperlipidemia. Lipitor 11. Metabolic bone disease. Continue vitamin D supplements as directed 12. CKD stage III. Creatinine baseline 1.49.  Creatinine  1.23 10/23 13. Chronic diastolic congestive heart failure monitor for any signs of fluid overload. Followed by cardiology services.Demadex resumed    weight is good.  -BUN/Cr improved.    -resumed demadex 10/24 Filed Weights   10/06/19 0325 10/07/19 0338 10/08/19 0443  Weight: 79.9 kg 78.8 kg 77.3 kg  14. Lymphedema.She did have a home health nurse to assist in leg wraps when needed -elevate leg in bed/chair -TEDS  -LLE looks good, no edema at present. I would resume wrapping as needed as an outpt. RLE more edematous at present d/t her recent fx/surgery 15.Endometrial cancer.Patient is  followed by Dr. Polly Cobia in [place]. Patient maintained on Megace 80 mg 2 times daily takes for 3 weeks and alternates with Nolvadex20 mg daily for 3 weeks 16.  Essential hypertension   Tenormin 12.5 twice twice daily  Continue hydralazine 12.5 twice daily, increased to 25 twice daily on 10/18  10/25 SBP's remain elevated---increase tenormin to 25mg  twice daily 17.  Drug-induced constipation  Bowel meds increased on 10/18  -moving bowels regularly now 18, New onset wheezing-  duoneb prn  10/26- required for 3rd time since here- ordered Albuterol inhalers since easier to use in home setting with spacer and ordered 2 puffs q4 hours prn- asked nursing to give next/before nebs.  10/27- Educated pt again on how to use inhaler.     LOS: 11 days A FACE TO FACE EVALUATION WAS PERFORMED  Joelynn Dust 10/08/2019, 9:16 AM

## 2019-10-08 NOTE — Progress Notes (Addendum)
Physical Therapy Session Note  Patient Details  Name: Christina Morgan MRN: [id number] Date of Birth: 1933-04-30  Today's Date: 10/08/2019 PT Individual Time: 1119-1207 and 1062-6948 PT Individual Time Calculation (min): 48 min and 44min  Short Term Goals: Week 2:  PT Short Term Goal 1 (Week 2): = LTGs due to ELOS  Skilled Therapeutic Interventions/Progress Updates:    Session 1: Pt received sitting in w/c reporting she has been incontinent of urine due to urgency and needs to urinate again. Sit<>stands using RW with CGA for steadying throughout session with 1x pt required 2 attempts to lift up fully into stance. Stand pivot transfer w/c>BSC using RW with CGA for steadying. Pt able to perform standing LB clothing management with CGA for steadying and increased time. Sit<>stand RW<>BSC with CGA for steadying. Pt continent of urine and performed standing peri-care with CGA for steadying and set-up assist - while sitting, therapist donned new pants with mod assist for threading LEs and pt able to complete pulling up pants over hips in standing with CGA for steadying and increased time. Stand pivot BSC>w/c using RW with CGA for steadying. Stand pivot w/c>EOB using RW with CGA for steadying and max cuing for proper set-up of transfer. Sit>supine, HOB set-up to replicate home environment with 1 bedrail, via reverse logroll with min assist for R LE management and education on using w/c for LE support as needed during the movement. Upon lying down pt reports severe "drunk" and dizziness feeling with pt noted to have nystagmus. Therapist performed L dix hallpike test with pt being symptomatic and having nystagmus therefore performed canalith repositioning maneuver with pt's daughter providing assistance for rolling and supine>sit. Stand pivot EOB>w/c using RW with CGA for steadying. Pt left sitting in w/c with needs in reach, seat belt alarm on, and pt's daughter present. Addendum: Prior to performing Marye Round test performed cervical ROM assessment and vertebral artery screen with pt noted to have limited R cervical rotation but no symptoms at end range of rotation. Pt reports some L neck discomfort during R cervical rotation during canalith repositioning maneuver - repositioned and provided increased head support to decrease discomfort.  Session 2: Pt received sitting in w/c and agreeable to therapy session. Performed sit<>stands using RW with CGA for steadying and intermittently requiring a few tries prior to being able to lift bottom fully from chair - therapist provided visual demonstration with verbal education on increasing anterior trunk lean. Stand pivot transfers using RW with CGA for steadying throughout session. Ambulated ~36ft using RW with CGA for steadying and w/c follow in event of pt fatigue - demonstrates decreased R lateral weightshift with decreased R LE weightbearing, step-to gait pattern leading with R LE, decreased BLE step length, decreased R LE foot clearance, and decreased gait speed. Transported to main therapy gym in w/c. Stand pivot w/c>EOM using RW with CGA for steadying. Performed standing R LE step up/down on 4" step with B UE support on RW and CGA for steadying 2x15 reps. Ambulated ~28ft x2 using RW to/from parallel bars with CGA to the bars and heavy min assist for balance walking back to mat due to fatigue. In // bars with B UE support performed lateral side stepping x52ft each direction with min assist for balance and manual facilitation for increased lateral weightshifting - pt continues to demonstrate decreased R lateral weightshif and decreased R LE weightbearing due to increased pain. Stand pivot EOM>w/c using RW with CGA for steadying. Transported back to room in w/c.  Stand pivot w/c>EOB using RW with CGA. Sit>supine via reverse logroll technique with therapist again setting up w/c for B LE support during the movement and using 1 bedrail per home set-up required min assist  for R LE management - no complaints of dizziness/"drunk" feeling upon lying in supine however did not assist for nystagmus. Pt left supine in bed with needs in reach and bed alarm on.  Therapy Documentation Precautions:  Precautions Precautions: Fall Restrictions Weight Bearing Restrictions: Yes RLE Weight Bearing: Weight bearing as tolerated  Pain: Session 1: Reports some pain in R LE but does not limit pt's participation in therapy session.  Session 2: Reports pain in R LE rated as 8-9/10 and reports no medication due at this time - provided rest breaks and repositioning for pain management during session.  Therapy/Group: Individual Therapy  Tawana Scale , PT, DPT 10/08/2019, 8:01 AM

## 2019-10-08 NOTE — Progress Notes (Signed)
Occupational Therapy Session Note  Patient Details  Name: LATARSHIA JERSEY MRN: [id number] Date of Birth: Jul 15, 1933  Today's Date: 10/08/2019 OT Individual Time: 1405-1500 OT Individual Time Calculation (min): 55 min    Short Term Goals: Week 2:  OT Short Term Goal 1 (Week 2): STG=LTG due to LOS  Skilled Therapeutic Interventions/Progress Updates:    Treatment session with focus on functional mobility and transfers in home environment.  Pt received upright in w/c reporting pain in Rt hip 9/10.  RN aware and medication not due yet, but able to provide during therapy session.  Pt ambulated short distances in ADL apt with RW with CGA.  Pt required min assist throughout session for sit > stand from w/c and from [facility] over toilet.  Pt able to ambulate 8' with CGA with RW to [facility] over toilet to simulate home setup.  Engaged in side stepping to simulate stepping through narrow bathroom doorway with pt reporting increased discomfort in Rt hip, but able to complete with RW with CGA.  Returned to room and ambulate 15' with RW with CGA back to w/c. Pt required frequent rest breaks throughout session due to pain in RLE.    Therapy Documentation Precautions:  Precautions Precautions: Fall Restrictions Weight Bearing Restrictions: Yes RLE Weight Bearing: Weight bearing as tolerated General:   Vital Signs: Therapy Vitals Temp: 97.7 F (36.5 C) Temp Source: Oral Pulse Rate: (!) 58 Resp: 18 BP: (!) 122/50 Patient Position (if appropriate): Sitting Oxygen Therapy SpO2: 98 % O2 Device: Room Air Pain: Pain Assessment Pain Scale: 0-10 Pain Score: 7  Pain Location: Leg Pain Orientation: Right Pain Descriptors / Indicators: Aching Pain Frequency: Intermittent Pain Onset: With Activity Patients Stated Pain Goal: 2 Pain Intervention(s): Medication (See eMAR)(tylenol 650 mg po)   Therapy/Group: Individual Therapy  Simonne Come 10/08/2019, 3:45 PM

## 2019-10-08 NOTE — Progress Notes (Signed)
Physical Therapy Session Note  Patient Details  Name: Christina Morgan MRN: [id number] Date of Birth: December 06, 1933  Today's Date: 10/08/2019 PT Individual Time: 0845-1000 PT Individual Time Calculation (min): 75 min   Short Term Goals: Week 2:  PT Short Term Goal 1 (Week 2): = LTGs due to ELOS  Skilled Therapeutic Interventions/Progress Updates:    Pt received seated in bed, agreeable to PT session. Pt reports soreness in R hip, premedicated prior to start of therapy session. Rolling L/R with min A and use of bedrails for dependent donning of pants, dependent to don TED hose. Supine to sit with min A with use of bedrail. Sit to stand with min A to RW. Ambulation x 10 ft from bed to w/c with RW and min A. Manual w/c propulsion x 150 ft with use of BUE and Supervision, increased time needed to complete. Ascend/descend 8 x 3" steps with 2 handrails and mod A. Ambulation 2 x 15 ft with RW and min A, decreased stance time on LLE due to decreased tolerance for WBing on RLE. Stand pivot transfer w/c to/from regular chair with arms with RW and min A, pt exhibits increased ease transferring to the R vs to the L. Pt left seated in w/c in room with needs in reach at end of session, B ELR in place for LE edema management.  Therapy Documentation Precautions:  Precautions Precautions: Fall Restrictions Weight Bearing Restrictions: Yes RLE Weight Bearing: Weight bearing as tolerated    Therapy/Group: Individual Therapy   Excell Seltzer, PT, DPT  10/08/2019, 1:10 PM

## 2019-10-08 NOTE — Progress Notes (Signed)
Discussed albuterol vs nebs with patient. She will ask for albuterol to try if SOB next rather than nebulizer to evaluate. Patient verbalized understanding. Could not describe method of taking but said her daughter knew. Provided additional education. She is on board with this plan.

## 2019-10-08 NOTE — Patient Care Conference (Signed)
Inpatient RehabilitationTeam Conference and Plan of Care Update Date: 10/08/2019   Time: 11:00 AM    Patient Name: Christina Morgan      Medical Record Number: [id number]  Date of Birth: May 25, 1933 Sex: Female         Room/Bed: 4M03C/4M03C-01 Payor Info: Payor: MEDICARE / Plan: MEDICARE PART A AND B / Product Type: *No Product type* /    Admit Date/Time:  09/27/2019  7:21 PM  Primary Diagnosis:  Closed subtrochanteric fracture of hip, right, sequela  Patient Active Problem List   Diagnosis Date Noted  . Drug induced constipation   . Shortness of breath   . Essential hypertension   . Chronic diastolic congestive heart failure ([place])   . Stage 3 chronic kidney disease   . Acute blood loss anemia   . Closed subtrochanteric fracture of hip, right, sequela 09/27/2019  . Traumatic closed trochanteric fracture of femur with minimal displacement, left, with delayed healing, subsequent encounter 09/27/2019  . PAF (paroxysmal atrial fibrillation) ([place])   . Vitamin D deficiency 09/25/2019  . Closed displaced intertrochanteric fracture of right femur ([place])   . Compression of lumbar vertebra ([place])   . Compression fracture of thoracic vertebra ([facility])   . Fall 09/23/2019  . SOB (shortness of breath) 01/04/2019  . Bilateral lower leg cellulitis 03/30/2018  . Chronic diastolic heart failure ([place]) 03/30/2018  . Edema of extremities 08/19/2014  . Essential hypertension, benign 05/15/2014  . Mixed hyperlipidemia 05/15/2014  . Persistent atrial fibrillation ([place]) 05/15/2014  . Endometrial ca [facility]) 12/28/2012    Expected Discharge Date: Expected Discharge Date: 10/12/19  Team Members Present: Physician leading conference: Dr. Courtney Heys Social Worker Present: Lennart Pall, LCSW Nurse Present: Benjie Karvonen, RN PT Present: Excell Seltzer, PT OT Present: Amy Rounds, OT SLP Present: Jettie Booze, CF-SLP PPS Coordinator present : Ileana Ladd, Burna Mortimer, SLP Case Manager:  Karene Fry,  RN     Current Status/Progress Goal Weekly Team Focus  Bowel/Bladder   Continent of B&B  Patient will continue to be continent until discharge  Continue to monitor and assess q shift; timed toileting   Swallow/Nutrition/ Hydration             ADL's   Min-mod A stand pivot transfers and short distance ambulation, mod A LB bathing/dressing, set-up UB bathing/dressing, mod-max A toileting at [facility] A Overall, mod A LB dressing  ADL re-training, functional transfers, functional activity tolerance and standing balance, family education; d/c planning   Mobility   min to mod A bed mobility, min to mod A stand to RW, min A SPT with RW, gait up to 10' with RW min A, Supervision w/c mobility  min A overall  transfers, family education, d/c planning   Communication             Safety/Cognition/ Behavioral Observations            Pain   Patient c/o pain to right hip. 6/10 PRN meds given as ordered  2/10  Continue to monitor and assess q shift; medicate with PRN pain meds as ordered   Skin   Scattered bruising, surgical incision healing sutures taken out no drainage or s/s of infection  Incision to right hip healing and no  s/s of infection  Continue to monitor and assess q shift.      *See Care Plan and progress notes for long and short-term goals.     Barriers to Discharge  Current Status/Progress Possible Resolutions Date Resolved  Nursing                  PT                    OT                  SLP                SW                Discharge Planning/Teaching Needs:         Team Discussion: R hip fx, CHF, PAF, SOB intermittently, nebs 3-4 X, needs albuterol inhaler taught to her.  RN will teach albuterol inhaler, mostly continent, wears brief, 1 accident over weekend, Norco for pain, too drowsy, will try tylenol.  OT min/mod sit to stand, LB B/D can hire help, fam ed with daughter tomorrow.  PT min/mod bed and amb 10" min A RW, S w/c mobility.   Revisions to Treatment  Plan: N/A     Medical Summary Current Status: Intermittent SOB- will teach how to do inhalers; mainly continent (accident over weekend) Weekly Focus/Goal: ; LBM 10/25  Barriers to Discharge: Decreased family/caregiver support;Home enviroment access/layout;Incontinence;Weight bearing restrictions;Wound care;Weight;Medical stability  Barriers to Discharge Comments: n/a Possible Resolutions to Barriers: therapy- min-mod assist   Continued Need for Acute Rehabilitation Level of Care: The patient requires daily medical management by a physician with specialized training in physical medicine and rehabilitation for the following reasons: Direction of a multidisciplinary physical rehabilitation program to maximize functional independence : Yes Medical management of patient stability for increased activity during participation in an intensive rehabilitation regime.: Yes Analysis of laboratory values and/or radiology reports with any subsequent need for medication adjustment and/or medical intervention. : Yes   I attest that I was present, lead the team conference, and concur with the assessment and plan of the team.   Retta Diones 10/08/2019, 1:23 PM  Team conference was held via web/ teleconference due to [place] - 19

## 2019-10-09 ENCOUNTER — Encounter (HOSPITAL_COMMUNITY): Payer: Medicare Other | Admitting: Occupational Therapy

## 2019-10-09 ENCOUNTER — Inpatient Hospital Stay (HOSPITAL_COMMUNITY): Payer: Medicare Other | Admitting: Physical Therapy

## 2019-10-09 ENCOUNTER — Ambulatory Visit (HOSPITAL_COMMUNITY): Payer: Medicare Other | Admitting: Physical Therapy

## 2019-10-09 NOTE — Progress Notes (Signed)
Physical Therapy Session Note  Patient Details  Name: Christina Morgan MRN: [id number] Date of Birth: [birth year]  Today's Date: 10/09/2019 PT Individual Time: 5188-4166 PT Individual Time Calculation (min): 38 min   and  Today's Date: 10/09/2019 PT Missed Time: 37 Minutes Missed Time Reason: Patient ill (Comment)  Short Term Goals: Week 2:  PT Short Term Goal 1 (Week 2): = LTGs due to ELOS  Skilled Therapeutic Interventions/Progress Updates:    Pt received supine in bed reporting she is still feeling sick on her stomach and is agreeable to bed level exercise but defers going to the therapy gym at this time.  Performed the following exercises using R LE 2 sets of 10 reps each: - supine heel slides with cuing and assist to maintain neutral hip internal/external rotation - supine short arc quads - supine hip abduction/adduction with cuing to limit hip external rotation with hip flexion compensation  - sidelying clamshell and reverse clamshell with assist for movement due to significant weakness Therapist provided multimodal cuing for proper technique/form of each exercise. Supine>sit, HOB slightly elevated and using bedrail, with supervision and increased time. Sit>stand low EOB>RW with heavy min assist for lifting in to standing. Lateral sidestepping towards HOB using RW with CGA for steadying. Pt continues to report feeling sick to her stomach and deferring further OOB activity at this time. Sit>supine, HOB flat and using bedrail per home set-up, with min assist for B LE management - pt denies any "drunk" or dizziness feeling upon lying down and there was no nystagmus noted. Pt left supine in bed with needs in reach and bed alarm on. Missed 37 minutes of skilled physical therapy.  Therapy Documentation Precautions:  Precautions Precautions: Fall Restrictions Weight Bearing Restrictions: Yes RLE Weight Bearing: Weight bearing as tolerated  Pain: Reports R anterior hip/groin pain,  unrated, therefore at end of therapy session therapist provided ice pack and educated pt to remove the ice in 48minutes.    Therapy/Group: Individual Therapy  Tawana Scale, PT, DPT 10/09/2019, 12:58 PM

## 2019-10-09 NOTE — Progress Notes (Signed)
Physical Therapy Session Note  Patient Details  Name: Christina Morgan MRN: [id number] Date of Birth: 23-Jun-1933  Today's Date: 10/09/2019 PT Individual Time: 1000-1100 PT Individual Time Calculation (min): 60 min   Short Term Goals: Week 2:  PT Short Term Goal 1 (Week 2): = LTGs due to ELOS  Skilled Therapeutic Interventions/Progress Updates:    Pt received seated in w/c with daughter present for hands-on family education. Reviewed how to cross threshold with RW with CGA/min A and RW. Demonstrated how to perform threshold management safely, then provided assist as pt performed, then had pt's daughter perform return demo. Car transfer with RW with mod A for BLE management at 24" simulation of pt's sedan height. Pt's daughter is able to assist pt with car transfer. Pt daughter demos good understanding of how to provide assist for pt safely for transfers and mobility. Reviewed management of w/c and RW parts. Pt and her daughter feel comfortable with education covered this date, all questions answered. Pt does have occasional SOA and some nausea during session,  MD and RN aware. RN able to provide anti-nausea medication at end of therapy session and MD reviewed nebulizer management with pt's daughter. Pt requests to return to bed at end of session. Stand pivot transfer w/c to bed with min A and RW. Sit to supine mod A for BLE management. Pt left semi-reclined in bed with needs in reach at end of session.  Therapy Documentation Precautions:  Precautions Precautions: Fall Restrictions Weight Bearing Restrictions: Yes RLE Weight Bearing: Weight bearing as tolerated    Therapy/Group: Individual Therapy   Excell Seltzer, PT, DPT  10/09/2019, 11:57 AM

## 2019-10-09 NOTE — Progress Notes (Signed)
Recreational Therapy Session Note  Patient Details  Name: Christina Morgan MRN: [id number] Date of Birth: Jan 08, 1933 Today's Date: 10/09/2019  Pain: c/o right hip pain, unrated.  PT provided ice pack at the end of session. Skilled Therapeutic Interventions/Progress Updates: Pt in bed with w/o feeling sick to her stomach and agreeable to bed level exercise with PT.  No TR intervention provided this session, emotional support provided. [place] 10/09/2019, 3:41 PM

## 2019-10-09 NOTE — Progress Notes (Signed)
[place] PHYSICAL MEDICINE & REHABILITATION PROGRESS NOTE  Subjective/Complaints:   Pt reports no SOB this AM. Feels good; just woke up though so might become SOB as time goes on today. Very excited- walked up 8 steps yesterday with therapy- has ramp at home.   ROS: Patient denies fever, rash, sore throat, blurred vision, nausea, vomiting, diarrhea, cough,  chest pain, joint or back pain, headache, or mood change.    Objective: Vital Signs: Blood pressure (!) 150/60, pulse 60, temperature 98.5 F (36.9 C), resp. rate 16, weight 76.8 kg, SpO2 97 %. No results found. Recent Labs    10/07/19 0802  WBC 6.6  HGB 11.0*  HCT 34.8*  PLT 370   Recent Labs    10/07/19 0802  NA 141  K 4.3  CL 111  CO2 22  GLUCOSE 91  BUN 18  CREATININE 1.08*  CALCIUM 8.6*    Physical Exam: BP (!) 150/60   Pulse 60   Temp 98.5 F (36.9 C)   Resp 16   Wt 76.8 kg   SpO2 97%   BMI 27.33 kg/m  Constitutional: No distress . Vital signs  reviewed. Awake, alert, sitting up in bed; cheerios on shirt, NAD HEENT: EOMI, oral membranes moist Neck: supple Cardiovascular: RRR without murmur. No JVD    Respiratory: CTA B/L GI: BS +, non-tender, non-distended   Skin: wounds clean with sutures; R hip improved edema Vascular changes bilateral lower extremities--stable Psych: pleasant Musc: Bilateral, right LE 1+ Neurological: Alert Appropriate cognitively.  Motor:  Left lower extremity: Hip flexion, knee extension 4-/5, ankle dorsiflexion 4/5 Right lower extremity: Hip flexion, knee extension 3-/5, ankle dorsiflexion 4/5.   Assessment/Plan: 1. Functional deficits secondary to right subtrochanteric femur fracture which require 3+ hours per day of interdisciplinary therapy in a comprehensive inpatient rehab setting.  Physiatrist is providing close team supervision and 24 hour management of active medical problems listed below.  Physiatrist and rehab team continue to assess barriers to  discharge/monitor patient progress toward functional and medical goals  Care Tool:  Bathing  Bathing activity did not occur: Safety/medical concerns Body parts bathed by patient: Right arm, Left arm, Chest, Abdomen(UB only 4/4)   Body parts bathed by helper: Buttocks     Bathing assist Assist Level: Set up assist     Upper Body Dressing/Undressing Upper body dressing Upper body dressing/undressing activity did not occur (including orthotics): N/A(patient had a gown on) What is the patient wearing?: Pull over shirt    Upper body assist Assist Level: Set up assist    Lower Body Dressing/Undressing Lower body dressing    Lower body dressing activity did not occur: N/A(no incontinent brief on) What is the patient wearing?: Pants, Incontinence brief     Lower body assist Assist for lower body dressing: Moderate Assistance - Patient 50 - 74%     Toileting Toileting    Toileting assist Assist for toileting: Moderate Assistance - Patient 50 - 74%     Transfers Chair/bed transfer  Transfers assist  Chair/bed transfer activity did not occur: Safety/medical concerns  Chair/bed transfer assist level: Contact Guard/Touching assist     Locomotion Ambulation   Ambulation assist   Ambulation activity did not occur: Safety/medical concerns  Assist level: Contact Guard/Touching assist Assistive device: Walker-rolling Max distance: 46ft   Walk 10 feet activity   Assist  Walk 10 feet activity did not occur: Safety/medical concerns  Assist level: Contact Guard/Touching assist Assistive device: Walker-rolling   Walk 50 feet activity  Assist Walk 50 feet with 2 turns activity did not occur: Safety/medical concerns         Walk 150 feet activity   Assist Walk 150 feet activity did not occur: Safety/medical concerns         Walk 10 feet on uneven surface  activity   Assist Walk 10 feet on uneven surfaces activity did not occur: Safety/medical  concerns         Wheelchair     Assist Will patient use wheelchair at discharge?: Yes Type of Wheelchair: Manual    Wheelchair assist level: Supervision/Verbal cueing Max wheelchair distance: 150'    Wheelchair 50 feet with 2 turns activity    Assist        Assist Level: Supervision/Verbal cueing   Wheelchair 150 feet activity     Assist     Assist Level: Supervision/Verbal cueing     Medical Problem List and Plan: 1.Decreased functional mobilitysecondary to right subtrochanteric hip fracture. Status post IM nailing with ORIF 09/24/2019.  Continue CIR PT, OT    2. Antithrombotics: -DVT/anticoagulation:Chronic Eliquis -antiplatelet therapy: N/A 3. Pain Management:Hydrocodone as needed  Improving pain control 4. Mood:Provide emotional support -antipsychotic agents: N/A 5. Neuropsych: This patientiscapable of making decisions on herown behalf. 6. Skin/Wound Care:remove sutures today  Prevalon boots ordered 7. Fluids/Electrolytes/Nutrition:Routine in and outs 8. Acute blood loss anemia.  Hemoglobin 10.9 on 10/19  9. PAF. Continue Eliquis as well as Tambocor 100 mg twice daily, Tenormin 12.5 mg twice daily. Cardiac rate controlled 10. Hyperlipidemia. Lipitor 11. Metabolic bone disease. Continue vitamin D supplements as directed 12. CKD stage III. Creatinine baseline 1.49.  Creatinine  1.23 10/23 13. Chronic diastolic congestive heart failure monitor for any signs of fluid overload. Followed by cardiology services.Demadex resumed    weight is good.  -BUN/Cr improved.    -resumed demadex 10/24  10/28- will recheck labs to make sure not drying out too much. Filed Weights   10/07/19 0338 10/08/19 0443 10/09/19 0542  Weight: 78.8 kg 77.3 kg 76.8 kg  14. Lymphedema.She did have a home health nurse to assist in leg wraps when needed -elevate leg in  bed/chair -TEDS  -LLE looks good, no edema at present. I would resume wrapping as needed as an outpt. RLE more edematous at present d/t her recent fx/surgery 15.Endometrial cancer.Patient is followed by Dr. Polly Cobia in [place]. Patient maintained on Megace 80 mg 2 times daily takes for 3 weeks and alternates with Nolvadex20 mg daily for 3 weeks 16.  Essential hypertension   Tenormin 12.5 twice twice daily  Continue hydralazine 12.5 twice daily, increased to 25 twice daily on 10/18  10/25 SBP's remain elevated---increase tenormin to 25mg  twice daily 17.  Drug-induced constipation  Bowel meds increased on 10/18  -moving bowels regularly now 18, New onset wheezing-  duoneb prn  10/26- required for 3rd time since here- ordered Albuterol inhalers since easier to use in home setting with spacer and ordered 2 puffs q4 hours prn- asked nursing to give next/before nebs.  10/27- Educated pt again on how to use inhaler.     LOS: 12 days A FACE TO FACE EVALUATION WAS PERFORMED  Amjad Fikes 10/09/2019, 9:26 AM

## 2019-10-09 NOTE — Progress Notes (Signed)
Occupational Therapy Session Note  Patient Details  Name: Christina Morgan MRN: [id number] Date of Birth: 05-11-1933  Today's Date: 10/09/2019 OT Individual Time: 0830-1000 OT Individual Time Calculation (min): 90 min    Short Term Goals: Week 2:  OT Short Term Goal 1 (Week 2): STG=LTG due to LOS  Skilled Therapeutic Interventions/Progress Updates:    Pt seen for OT caregiver education session. Pt in supine upon arrival, agreeable to tx session and denying pain. Pt's daughter, [name], present for caregiver education session, she will be primary caregiver at d/c.  Pt transferred tos itting EOB with supervision using hospital bed functions. She dressed seated EOB, supervision UB dressing and max A provided for LB dressing for time and energy conservation. She stood at [facility] with min-mod A, noted with strong L lean and requiring VCs for midline orientation. SHe was able to assist with pulling up pants though requiring assist to advance fully over brief/hips.  She ambulated to w/c ~44ft with min A using RW.  In ADL apartment, daughter provided hands on assistance when ambulating pt throughout apartment in simulation of home environment. Simualted toileting task using  BSC, daughter providing steadying assist and aprropriate cuing throughout transfer and toileting task.  Completed simulated tub/shower transfer utilizing tub bench. Pt able to recall technique taught in previous session. Completed transfer with assist to manage R LE over tub wall and VCs for sequencing/technique.  Pt then voiced need for toileting task. Transitioned to standard toilet and completed toileting task with continent BM. Pt reports increased nausea and feeling of needing to vomit while seated on toilet, no emesis and following seated rest break was able to transfer off toilet with max A. Assist provided for thoroughness of hygiene.  She returned to w/c and hand off to PT.  Pt's daughter voiced feeling comfortable with level of  care pt requires. Education provided throughout session regarding pt's CLOF, benefits of participation and independence with ADLs, DME, continuum of care and d/c planning.   Therapy Documentation Precautions:  Precautions Precautions: Fall Restrictions Weight Bearing Restrictions: Yes RLE Weight Bearing: Weight bearing as tolerated   Therapy/Group: Individual Therapy  Jeyden Coffelt L 10/09/2019, 6:56 AM

## 2019-10-10 ENCOUNTER — Inpatient Hospital Stay (HOSPITAL_COMMUNITY): Payer: Medicare Other

## 2019-10-10 ENCOUNTER — Inpatient Hospital Stay (HOSPITAL_COMMUNITY): Payer: Medicare Other | Admitting: Physical Therapy

## 2019-10-10 ENCOUNTER — Inpatient Hospital Stay (HOSPITAL_COMMUNITY): Payer: Medicare Other | Admitting: Occupational Therapy

## 2019-10-10 LAB — BASIC METABOLIC PANEL
Anion gap: 10 (ref 5–15)
BUN: 22 mg/dL (ref 8–23)
CO2: 26 mmol/L (ref 22–32)
Calcium: 8.3 mg/dL — ABNORMAL LOW (ref 8.9–10.3)
Chloride: 107 mmol/L (ref 98–111)
Creatinine, Ser: 1.47 mg/dL — ABNORMAL HIGH (ref 0.44–1.00)
GFR calc Af Amer: 38 mL/min — ABNORMAL LOW (ref >60)
GFR calc non Af Amer: 32 mL/min — ABNORMAL LOW (ref >60)
Glucose, Bld: 99 mg/dL (ref 70–99)
Potassium: 4.5 mmol/L (ref 3.5–5.1)
Sodium: 143 mmol/L (ref 135–145)

## 2019-10-10 MED ORDER — TORSEMIDE 10 MG PO TABS
10.0000 mg | ORAL_TABLET | Freq: Every day | ORAL | Status: DC
  Administered 2019-10-11 – 2019-10-12 (×2): 10 mg via ORAL
  Filled 2019-10-10 (×2): qty 1

## 2019-10-10 MED ORDER — SODIUM CHLORIDE 0.9 % NICU IV INFUSION SIMPLE: INJECTION | INTRAVENOUS | Status: DC

## 2019-10-10 MED ORDER — SODIUM CHLORIDE 0.9 % NICU IV INFUSION SIMPLE
INJECTION | INTRAVENOUS | Status: DC
  Filled 2019-10-10 (×3): qty 500

## 2019-10-10 MED ORDER — SODIUM CHLORIDE 0.9 % IV SOLN
INTRAVENOUS | Status: DC
  Administered 2019-10-10: 16:00:00 via INTRAVENOUS

## 2019-10-10 NOTE — Progress Notes (Signed)
Physical Therapy Session Note  Patient Details  Name: Christina Morgan MRN: [id number] Date of Birth: [birth year]  Today's Date: 10/10/2019 PT Individual Time: 0900-1000; 1430-1530 PT Individual Time Calculation (min): 60 min and 60 min  Short Term Goals: Week 2:  PT Short Term Goal 1 (Week 2): = LTGs due to ELOS  Skilled Therapeutic Interventions/Progress Updates:    Session 1: Pt received seated in w/c in room, agreeable to PT session. Pt reports pain in R hip, not rated and premedicated prior to start of therapy session. Pt reports urge to urinate. Sit to stand with min A to RW. Ambulation x 10 ft with RW and min A to bathroom. Toilet transfer with min A and RW, dependent for clothing management, setup A for pericare. See Flowsheet for details. Ambulation x 10 ft back to w/c with RW and min A. Manual w/c propulsion x 150 ft with use of BUE and Supervision, increased time needed to complete. Stand pivot transfer w/c to/from mat table with min A and RW. Session focus on standing tolerance and balance with no UE support. Standing ball toss with min to mod A and no UE support, pt has increase in posterior bias with onset of fatigue. Pt tolerates standing x 1-2 min x 3 reps. Pt requests to return to bed at end of session. Sit to supine with mod A for BLE management. No issues with dizziness with bed mobility.  Session 2: Pt received supine in bed, agreeable to PT session. No complaints of pain. Assessed leg length discrepancy to determine of cause of L lateral lean, both limbs are 34.5", no discrepancy noted. Supine to sit with min A and use of bedrail. Sit to stand with min A to RW. Ambulation into bathroom with RW and min A. Toilet transfer with min A and RW. Pt is dependent for clothing management, setup A for pericare. Pt's w/c she will d/c home with arrived prior to session, adjusted leg rest length and anti-tippers for improved fit and safety while seated in w/c. Manual w/c propulsion x 150 ft  with use of BUE and Supervision. Standing balance in front of mirror with focus on midline and standing upright, min A and use of RW for balance. Attempt some standing without use of RW, pt tends to fall posteriorly, v/c for use of ankle and hip strategy. Pt able to maintain balance without UE support and min A x 20 sec. Pt requests to return to bed at end of session. Sit to supine mod A for BLE management. Pt left semi-reclined in bed with ice pack to R hip at end of session for pain management.  Therapy Documentation Precautions:  Precautions Precautions: Fall Restrictions Weight Bearing Restrictions: Yes RLE Weight Bearing: Weight bearing as tolerated    Therapy/Group: Individual Therapy   Excell Seltzer, PT, DPT  10/10/2019, 12:18 PM

## 2019-10-10 NOTE — Plan of Care (Signed)
  Problem: Consults Goal: RH GENERAL PATIENT EDUCATION Description: See Patient Education module for education specifics. Outcome: Progressing Goal: Skin Care Protocol Initiated - if Braden Score 18 or less Description: If consults are not indicated, leave blank or document N/A Outcome: Progressing   Problem: RH BOWEL ELIMINATION Goal: RH STG MANAGE BOWEL WITH ASSISTANCE Description: STG Manage Bowel with mod I Assistance. Outcome: Progressing   Problem: RH BLADDER ELIMINATION Goal: RH STG MANAGE BLADDER WITH ASSISTANCE Description: STG Manage Bladder With mod I Assistance Outcome: Progressing   Problem: RH SKIN INTEGRITY Goal: RH STG SKIN FREE OF INFECTION/BREAKDOWN Description: Pt will be free of infection and skin breakdown during admission Outcome: Progressing Goal: RH STG MAINTAIN SKIN INTEGRITY WITH ASSISTANCE Description: STG Maintain Skin Integrity With mod I Assistance. Outcome: Progressing   Problem: RH SAFETY Goal: RH STG ADHERE TO SAFETY PRECAUTIONS W/ASSISTANCE/DEVICE Description: STG Adhere to Safety Precautions With min Assistance/Device. Outcome: Progressing   Problem: RH PAIN MANAGEMENT Goal: RH STG PAIN MANAGED AT OR BELOW PT'S PAIN GOAL Description: Pain scale <4/10 Outcome: Progressing   Problem: RH KNOWLEDGE DEFICIT GENERAL Goal: RH STG INCREASE KNOWLEDGE OF SELF CARE AFTER HOSPITALIZATION Description: Pt and family will be demonstrate fall and safety precautions in the home and follow hip precautions also monitor for sign of infection prior to DC Outcome: Progressing   Problem: RH SKIN INTEGRITY Goal: RH STG ABLE TO PERFORM INCISION/WOUND CARE W/ASSISTANCE Description: STG Able To Perform Incision/Wound Care With mod I Assistance. Outcome: Not Applicable

## 2019-10-10 NOTE — Progress Notes (Signed)
Physical Therapy Session Note  Patient Details  Name: Christina Morgan MRN: [id number] Date of Birth: [birth year]  Today's Date: 10/10/2019 PT Individual Time: 7858-8502 PT Individual Time Calculation (min): 31 min   Short Term Goals: Week 1:  PT Short Term Goal 1 (Week 1): Pt will perform supine<>sit with mod assist PT Short Term Goal 1 - Progress (Week 1): Met PT Short Term Goal 2 (Week 1): Pt will perform sit<>stand from chair height using LRAD with mod assist PT Short Term Goal 2 - Progress (Week 1): Met PT Short Term Goal 3 (Week 1): Pt will perform bed<>chair transfers with max assist PT Short Term Goal 3 - Progress (Week 1): Met PT Short Term Goal 4 (Week 1): Pt wil initiate gait PT Short Term Goal 4 - Progress (Week 1): Met Week 2:  PT Short Term Goal 1 (Week 2): = LTGs due to ELOS Week 3:     Skilled Therapeutic Interventions/Progress Updates:    PAIN 4/10, states nursing gave her tylenol for hip pain, treatment to tolerance  Pt initially supine.  Supine to sit w/min assist for RLE management.  Scoots in sitting w/supervision.  STS w/rw and bed elevated w/cga, mild post tendency.  Gait approx 61f in room to commode including transition at doorway w/cga.   Turns w/cga, stands w/cga requiring max assist for lowering pants/brief. STS from commode using elevated toilet seat armrests and cga, min assist for balance, pt able to perform hygiene w/set up assist and total assist for raising pants in standing, mild post tendency in standing. Gait approx 11fto bed w/cga, antalgic gait., turn/sit w/cues for controlled descent.  therex in sitting: bilat LAQs x 15 Repeated STS w/RW x 6reps for LE strengthening and balance w/transition. Sit to supine w/min assist for RLE mgmt. Supine RLE hip abd/add x 12  Pt left supine w/rails up x 3, alarm set, bed in lowest position, and needs in reach.   Therapy Documentation Precautions:  Precautions Precautions: Fall Restrictions Weight  Bearing Restrictions: Yes RLE Weight Bearing: Weight bearing as tolerated    Therapy/Group: Individual Therapy  BaCallie FieldingPTSterling Heights0/29/2020, 12:29 PM

## 2019-10-10 NOTE — Progress Notes (Signed)
[place] PHYSICAL MEDICINE & REHABILITATION PROGRESS NOTE  Subjective/Complaints:   Pt reports her voice is a little hoarse. Otherwise, denies SOB.   ROS: Patient denies fever, rash, sore throat, blurred vision, nausea, vomiting, diarrhea, cough,  chest pain, joint or back pain, headache, or mood change.    Objective: Vital Signs: Blood pressure (!) 128/51, pulse (!) 59, temperature 98.1 F (36.7 C), temperature source Oral, resp. rate 17, weight 77.4 kg, SpO2 96 %. No results found. No results for input(s): WBC, HGB, HCT, PLT in the last 72 hours. Recent Labs    10/10/19 0557  NA 143  K 4.5  CL 107  CO2 26  GLUCOSE 99  BUN 22  CREATININE 1.47*  CALCIUM 8.3*    Physical Exam: BP (!) 128/51 (BP Location: Right Arm)   Pulse (!) 59   Temp 98.1 F (36.7 C) (Oral)   Resp 17   Wt 77.4 kg   SpO2 96%   BMI 27.54 kg/m  Constitutional: No distress . Vital signs  reviewed. Awake, alert, sitting up in chair at bedside, OT in room, NAD HEENT: EOMI, oral membranes moist Neck: supple Cardiovascular: RRR without murmur. No JVD    Respiratory: CTA B/L no wheezes, rales, rhonchi-slightly decreased air movement noted, but adequate  GI: BS +, non-tender, non-distended   Skin: wounds clean with sutures; R hip improved edema Vascular changes bilateral lower extremities--stable Psych: pleasant Musc: Bilateral, right LE 1+ Neurological: Alert Appropriate cognitively.  Motor:  Left lower extremity: Hip flexion, knee extension 4-/5, ankle dorsiflexion 4/5 Right lower extremity: Hip flexion, knee extension 3-/5, ankle dorsiflexion 4/5.   Assessment/Plan: 1. Functional deficits secondary to right subtrochanteric femur fracture which require 3+ hours per day of interdisciplinary therapy in a comprehensive inpatient rehab setting.  Physiatrist is providing close team supervision and 24 hour management of active medical problems listed below.  Physiatrist and rehab team continue  to assess barriers to discharge/monitor patient progress toward functional and medical goals  Care Tool:  Bathing  Bathing activity did not occur: Safety/medical concerns Body parts bathed by patient: Right arm, Left arm, Chest, Abdomen(UB only 4/4)   Body parts bathed by helper: Buttocks     Bathing assist Assist Level: Set up assist     Upper Body Dressing/Undressing Upper body dressing Upper body dressing/undressing activity did not occur (including orthotics): N/A(patient had a gown on) What is the patient wearing?: Pull over shirt    Upper body assist Assist Level: Supervision/Verbal cueing    Lower Body Dressing/Undressing Lower body dressing    Lower body dressing activity did not occur: N/A(no incontinent brief on) What is the patient wearing?: Pants, Incontinence brief     Lower body assist Assist for lower body dressing: Minimal Assistance - Patient > 75%     Toileting Toileting    Toileting assist Assist for toileting: Maximal Assistance - Patient 25 - 49%     Transfers Chair/bed transfer  Transfers assist  Chair/bed transfer activity did not occur: Safety/medical concerns  Chair/bed transfer assist level: Minimal Assistance - Patient > 75%     Locomotion Ambulation   Ambulation assist   Ambulation activity did not occur: Safety/medical concerns  Assist level: Contact Guard/Touching assist Assistive device: Walker-rolling Max distance: 47ft   Walk 10 feet activity   Assist  Walk 10 feet activity did not occur: Safety/medical concerns  Assist level: Contact Guard/Touching assist Assistive device: Walker-rolling   Walk 50 feet activity   Assist Walk 50 feet with 2  turns activity did not occur: Safety/medical concerns         Walk 150 feet activity   Assist Walk 150 feet activity did not occur: Safety/medical concerns         Walk 10 feet on uneven surface  activity   Assist Walk 10 feet on uneven surfaces activity did  not occur: Safety/medical concerns         Wheelchair     Assist Will patient use wheelchair at discharge?: Yes Type of Wheelchair: Manual    Wheelchair assist level: Supervision/Verbal cueing Max wheelchair distance: 150'    Wheelchair 50 feet with 2 turns activity    Assist        Assist Level: Supervision/Verbal cueing   Wheelchair 150 feet activity     Assist     Assist Level: Supervision/Verbal cueing     Medical Problem List and Plan: 1.Decreased functional mobilitysecondary to right subtrochanteric hip fracture. Status post IM nailing with ORIF 09/24/2019.  Continue CIR PT, OT    2. Antithrombotics: -DVT/anticoagulation:Chronic Eliquis -antiplatelet therapy: N/A 3. Pain Management:Hydrocodone as needed  Improving pain control 4. Mood:Provide emotional support -antipsychotic agents: N/A 5. Neuropsych: This patientiscapable of making decisions on herown behalf. 6. Skin/Wound Care:remove sutures today  Prevalon boots ordered 7. Fluids/Electrolytes/Nutrition:Routine in and outs 8. Acute blood loss anemia.  Hemoglobin 10.9 on 10/19  9. PAF. Continue Eliquis as well as Tambocor 100 mg twice daily, Tenormin 12.5 mg twice daily. Cardiac rate controlled 10. Hyperlipidemia. Lipitor 11. Metabolic bone disease. Continue vitamin D supplements as directed 12. CKD stage III. Creatinine baseline 1.49.  Creatinine  1.23 10/23  10/29- Cr up to 1.47- will give IVFs 60cc/hr x 12 hrs and recheck in AM 13. Chronic diastolic congestive heart failure monitor for any signs of fluid overload. Followed by cardiology services.Demadex resumed    weight is good.  -BUN/Cr improved.    -resumed demadex 10/24  10/28- will recheck labs to make sure not drying out too much. 10/29- will reduce Demadex to 10 mg since kidney function is suffering Filed Weights   10/08/19 0443 10/09/19 0542 10/10/19 0500  Weight:  77.3 kg 76.8 kg 77.4 kg  14. Lymphedema.She did have a home health nurse to assist in leg wraps when needed -elevate leg in bed/chair -TEDS  -LLE looks good, no edema at present. I would resume wrapping as needed as an outpt. RLE more edematous at present d/t her recent fx/surgery 15.Endometrial cancer.Patient is followed by Dr. Polly Cobia in [place]. Patient maintained on Megace 80 mg 2 times daily takes for 3 weeks and alternates with Nolvadex20 mg daily for 3 weeks 16.  Essential hypertension   Tenormin 12.5 twice twice daily  Continue hydralazine 12.5 twice daily, increased to 25 twice daily on 10/18  10/25 SBP's remain elevated---increase tenormin to 25mg  twice daily  10/29- better control 17.  Drug-induced constipation  Bowel meds increased on 10/18  -moving bowels regularly now 18, New onset wheezing-  duoneb prn  10/26- required for 3rd time since here- ordered Albuterol inhalers since easier to use in home setting with spacer and ordered 2 puffs q4 hours prn- asked nursing to give next/before nebs.  10/27- Educated pt again on how to use inhaler.     LOS: 13 days A FACE TO FACE EVALUATION WAS PERFORMED  Pellegrino Kennard 10/10/2019, 8:57 AM

## 2019-10-10 NOTE — Progress Notes (Signed)
Social Work Patient ID: Christina Morgan, female   DOB: 03-08-1933, 85 y.o.   MRN: [id number]  Have reviewed team conference with pt and daughter and both feeling ready for d/c on Saturday.  Family ed completed yesterday.  Daughter has arranged private duty support via [employer].  I have arranged [place] f/u through [facility] (pt has had in the past.)  Cont to follow.  Ronella Plunk, LCSW

## 2019-10-10 NOTE — Progress Notes (Signed)
Recreational Therapy Session Note  Patient Details  Name: Christina Morgan MRN: [id number] Date of Birth: Apr 06, 1933 Today's Date: 10/10/2019 Time: 905-955 Pain: no c/o Skilled Therapeutic Interventions/Progress Updates: Session focused on activity tolerance, w/c mobility, sit-->stands, standing tolerance/balance during co-treat with PT.  Also discussed discharge planning with pt sharing that her biggest concern is getting to the bathroom efficiently.  Reviewed safety concerns with urgency.   Pt propelled w/c from her room to the therapy gym using BUEs with supervision with extra time and rest breaks.  Once in the gym, pt performed stand pivot transfers & sit-stands with RW and standing balance activity without UE support with contact guard-min assist level.  Pt with posterior lean when fatigued.  Pt excited about upcoming discharge and states that she and family are prepared.  Therapy/Group: Co-Treatment  Lenore Moyano 10/10/2019, 11:22 AM

## 2019-10-10 NOTE — Progress Notes (Signed)
Occupational Therapy Session Note  Patient Details  Name: Christina Morgan MRN: [id number] Date of Birth: 1933/06/13  Today's Date: 10/10/2019 OT Individual Time: 0700-0755 OT Individual Time Calculation (min): 55 min    Short Term Goals: Week 2:  OT Short Term Goal 1 (Week 2): STG=LTG due to LOS  Skilled Therapeutic Interventions/Progress Updates:    Upon entering the room, pt supine in bed with no c/o pain and agreeable to OT intervention. Pt reports, " I feel much better". Pt performs supine >sit with supervision to EOB. Pt transfers into wheelchair with min A and use of RW. Pt bathing self with focus on sit <>Stand from wheelchair level. Pt performing UB self care with set up A and min A for LB self care. Pt does report fatigue with self care tasks and required multiple rest breaks as needed. OT provided education and gave pt incentive spirometer and pt demonstrated x 10 reps. Pt remained in wheelchair at end of session with call bell and all needed items within reach upon exiting the room.   Therapy Documentation Precautions:  Precautions Precautions: Fall Restrictions Weight Bearing Restrictions: Yes RLE Weight Bearing: Weight bearing as tolerated Vital Signs: Therapy Vitals Temp: 98.1 F (36.7 C) Temp Source: Oral Pulse Rate: (!) 59 Resp: 17 BP: (!) 128/51 Patient Position (if appropriate): Lying Oxygen Therapy SpO2: 96 % O2 Device: Room Air    Therapy/Group: Individual Therapy  Gypsy Decant 10/10/2019, 7:57 AM

## 2019-10-10 NOTE — Discharge Summary (Signed)
Physician Discharge Summary  Patient ID: Christina Morgan MRN: [id number] DOB/AGE: 1933-04-06 85 y.o.  Admit date: 09/27/2019 Discharge date: 10/12/2019  Discharge Diagnoses:  Principal Problem:   Closed subtrochanteric fracture of hip, right, sequela Active Problems:   Traumatic closed trochanteric fracture of femur with minimal displacement, left, with delayed healing, subsequent encounter   Essential hypertension   Chronic diastolic congestive heart failure (HCC)   Stage 3 chronic kidney disease   Acute blood loss anemia   Drug induced constipation   Shortness of breath DVT prophylaxis PAF Lymphedema History of endometrial cancer  Discharged Condition: Stable  Significant Diagnostic Studies: Dg Chest 1 View  Result Date: 09/29/2019 CLINICAL DATA:  Shortness of breath. EXAM: CHEST  1 VIEW COMPARISON:  September 23, 2019 FINDINGS: There is a small left pleural effusion with underlying atelectasis. The heart, hila, mediastinum, lungs, and pleura otherwise unchanged. IMPRESSION: Small stable left pleural effusion with atelectasis. No other change. Electronically Signed   By: Dorise Bullion III M.D   On: 09/29/2019 14:13   Dg Chest 1 View  Result Date: 09/23/2019 CLINICAL DATA:  Fall. EXAM: CHEST  1 VIEW COMPARISON:  March 30, 2018. FINDINGS: Mild cardiomegaly is noted. Atherosclerosis of thoracic aorta is noted. No pneumothorax is noted. Lungs are clear. Bony thorax is unremarkable. IMPRESSION: No acute cardiopulmonary abnormality seen. Aortic Atherosclerosis (ICD10-I70.0). Electronically Signed   By: Marijo Conception M.D.   On: 09/23/2019 15:58   Dg Thoracic Spine 2 View  Result Date: 09/23/2019 CLINICAL DATA:  Back pain after fall. EXAM: THORACIC SPINE 2 VIEWS COMPARISON:  Apr 24, 2019. FINDINGS: Status post T11 and T12 kyphoplasty. New compression deformities are seen involving the T7, T5 and T4 vertebral bodies concerning for fractures of indeterminate age. No  spondylolisthesis is noted. Diffuse osteopenia is noted. IMPRESSION: Status post T11 and T12 kyphoplasty. New compression deformities are noted at T4, T5 and T7 concerning for fractures of indeterminate age. MRI may be performed for further evaluation. Electronically Signed   By: Marijo Conception M.D.   On: 09/23/2019 15:55   Dg Lumbar Spine Complete  Result Date: 09/23/2019 CLINICAL DATA:  Low back pain after fall. EXAM: LUMBAR SPINE - COMPLETE 4+ VIEW COMPARISON:  December 18, 2017. FINDINGS: Mild levoscoliosis of lumbar spine is noted. Status post kyphoplasty of T11 and T12 vertebral bodies. Diffuse osteopenia is noted. Severe compression deformity of L1 vertebral body is noted concerning for fracture of indeterminate age. Mild degenerative disc disease is noted at L2-3, L3-4 and L4-5. IMPRESSION: Interval development of compression deformity of L1 vertebral body concerning for fracture of indeterminate age. MRI may be performed to evaluate for acute fracture. Multilevel degenerative disc disease is noted. Diffuse osteopenia. Electronically Signed   By: Marijo Conception M.D.   On: 09/23/2019 15:57   Ct Head Wo Contrast  Result Date: 09/23/2019 CLINICAL DATA:  Posttraumatic headache after fall. No loss of consciousness. EXAM: CT HEAD WITHOUT CONTRAST CT CERVICAL SPINE WITHOUT CONTRAST TECHNIQUE: Multidetector CT imaging of the head and cervical spine was performed following the standard protocol without intravenous contrast. Multiplanar CT image reconstructions of the cervical spine were also generated. COMPARISON:  None. FINDINGS: CT HEAD FINDINGS Brain: Mild chronic ischemic white matter disease is noted. Large calcified left parietal meningioma is noted. No mass effect or midline shift is noted. No hemorrhage or acute infarction is noted. Ventricular size is within normal limits. Vascular: No hyperdense vessel or unexpected calcification. Skull: Normal. Negative for fracture or focal lesion.  Sinuses/Orbits: No acute finding. Other: None. CT CERVICAL SPINE FINDINGS Alignment: Minimal grade 1 anterolisthesis of C5-6 is noted secondary to posterior facet joint hypertrophy. Skull base and vertebrae: No acute fracture. No primary bone lesion or focal pathologic process. Soft tissues and spinal canal: No prevertebral fluid or swelling. No visible canal hematoma. Disc levels: Mild degenerative disc disease is noted at C5-6 and C6-7. Upper chest: Negative. Other: Degenerative changes are seen involving the right-sided posterior facet joints. IMPRESSION: Mild chronic ischemic white matter disease. Large calcified left parietal meningioma. No acute intracranial abnormality seen. Mild multilevel degenerative disc disease. No acute abnormality seen in the cervical spine. Electronically Signed   By: Marijo Conception M.D.   On: 09/23/2019 14:14   Ct Cervical Spine Wo Contrast  Result Date: 09/23/2019 CLINICAL DATA:  Posttraumatic headache after fall. No loss of consciousness. EXAM: CT HEAD WITHOUT CONTRAST CT CERVICAL SPINE WITHOUT CONTRAST TECHNIQUE: Multidetector CT imaging of the head and cervical spine was performed following the standard protocol without intravenous contrast. Multiplanar CT image reconstructions of the cervical spine were also generated. COMPARISON:  None. FINDINGS: CT HEAD FINDINGS Brain: Mild chronic ischemic white matter disease is noted. Large calcified left parietal meningioma is noted. No mass effect or midline shift is noted. No hemorrhage or acute infarction is noted. Ventricular size is within normal limits. Vascular: No hyperdense vessel or unexpected calcification. Skull: Normal. Negative for fracture or focal lesion. Sinuses/Orbits: No acute finding. Other: None. CT CERVICAL SPINE FINDINGS Alignment: Minimal grade 1 anterolisthesis of C5-6 is noted secondary to posterior facet joint hypertrophy. Skull base and vertebrae: No acute fracture. No primary bone lesion or focal  pathologic process. Soft tissues and spinal canal: No prevertebral fluid or swelling. No visible canal hematoma. Disc levels: Mild degenerative disc disease is noted at C5-6 and C6-7. Upper chest: Negative. Other: Degenerative changes are seen involving the right-sided posterior facet joints. IMPRESSION: Mild chronic ischemic white matter disease. Large calcified left parietal meningioma. No acute intracranial abnormality seen. Mild multilevel degenerative disc disease. No acute abnormality seen in the cervical spine. Electronically Signed   By: Marijo Conception M.D.   On: 09/23/2019 14:14   Pelvis Portable  Result Date: 09/24/2019 CLINICAL DATA:  Postoperative right hip intramedullary nail placement EXAM: PORTABLE PELVIS 1-2 VIEWS COMPARISON:  Same day radiographs FINDINGS: Post open reduction internal fixation of the comminuted intertrochanteric right femur fracture with extension into the proximal femoral shaft. There is been placement of an intramedullary nail and transcervical cancellous screw. Alignment across the transfixed fracture line is near anatomic. Displaced fracture fragment involving the lesser trochanter is in similar positioning to preoperative management. Catheter projects over the midline pelvis. Mild degenerative changes are noted in both hips. Expected postsurgical soft tissue changes are noted about the right hip with overlying swelling and small amount of intra-articular gas. IMPRESSION: 1. Post open reduction internal fixation of the comminuted intertrochanteric right femur fracture without evidence of hardware complication. 2. Displaced fracture fragment involving the lesser trochanter is in similar positioning to preoperative management. Electronically Signed   By: Lovena Le M.D.   On: 09/24/2019 23:04   Dg C-arm 1-60 Min  Result Date: 09/24/2019 CLINICAL DATA:  Proximal right femur fracture. EXAM: RIGHT FEMUR 2 VIEWS; DG C-ARM 1-60 MIN COMPARISON:  Radiographs dated 09/23/2019  FINDINGS: Multiple C-arm images demonstrate the patient undergoing open reduction and internal fixation of the comminuted fracture of the proximal right femoral shaft and lesser trochanter. Intramedullary nail and lag screw have  been inserted. Major fragments are near anatomic in alignment and position. Lesser trochanter remains avulsed and displaced. IMPRESSION: Open reduction and internal fixation of comminuted fracture of the proximal right femur. Electronically Signed   By: Lorriane Shire M.D.   On: 09/24/2019 21:05   Dg Hip Unilat W Or Wo Pelvis 2-3 Views Right  Result Date: 09/23/2019 CLINICAL DATA:  Right hip pain after fall. EXAM: DG HIP (WITH OR WITHOUT PELVIS) 2-3V RIGHT COMPARISON:  None. FINDINGS: Severely displaced and comminuted fracture is seen involving the intertrochanteric region of the proximal right femur. Vascular calcifications are noted. Right femoral head remains within the acetabulum. IMPRESSION: Severely displaced and comminuted intertrochanteric fracture of proximal right femur. Electronically Signed   By: Marijo Conception M.D.   On: 09/23/2019 15:53   Dg Femur, Min 2 Views Right  Result Date: 09/24/2019 CLINICAL DATA:  Proximal right femur fracture. EXAM: RIGHT FEMUR 2 VIEWS; DG C-ARM 1-60 MIN COMPARISON:  Radiographs dated 09/23/2019 FINDINGS: Multiple C-arm images demonstrate the patient undergoing open reduction and internal fixation of the comminuted fracture of the proximal right femoral shaft and lesser trochanter. Intramedullary nail and lag screw have been inserted. Major fragments are near anatomic in alignment and position. Lesser trochanter remains avulsed and displaced. IMPRESSION: Open reduction and internal fixation of comminuted fracture of the proximal right femur. Electronically Signed   By: Lorriane Shire M.D.   On: 09/24/2019 21:05   Dg Femur Port, Min 2 Views Right  Result Date: 09/24/2019 CLINICAL DATA:  Postop, right hip intramedullary nail placement  EXAM: RIGHT FEMUR PORTABLE 2 VIEW COMPARISON:  Same day radiographs FINDINGS: Patient is post intramedullary nail and transcervical cancellous screw placement transfixing the previously described intertrochanteric right femur fracture which extends into the proximal femoral diaphysis. Hardware is secured distally by 2 fully threaded, transversely oriented fixation screws in the distal diaphysis. A displaced fracture fragment involving the lesser trochanters in similar positioning to the preoperative radiographs. Femoral head remains normally located. There is diffuse soft tissue swelling of the right lower extremity in expected postoperative soft tissue gas and intra-articular gas. Catheter tubing overlies the right upper leg. Vascular calcium noted in the medial thigh. Alignment at the knee is grossly preserved though incompletely evaluated on non dedicated exam. IMPRESSION: 1. Post intramedullary nail and transcervical right femur fracture fixation without evidence of hardware complication. 2. Displaced fracture fragment involving the lesser trochanters in similar positioning to the preoperative radiographs. Electronically Signed   By: Lovena Le M.D.   On: 09/24/2019 23:06   Vas [place] Lower Extremity Venous (dvt)  Result Date: 09/29/2019  Lower Venous Study Indications: Acute SOB.  Risk Factors: Closed displaced intertrochanteric fracture of right femur: status post IM nailing 10/13. CHF, lymphedema, Paroxysmal atrial fibrillation Limitations: Significant edema (pitting in right calf). Comparison Study: Prior negative study from 03/30/2018 is available for                   comparison Performing Technologist: Sharion Dove RVS  Examination Guidelines: A complete evaluation includes B-mode imaging, spectral Doppler, color Doppler, and power Doppler as needed of all accessible portions of each vessel. Bilateral testing is considered an integral part of a complete examination. Limited examinations for  reoccurring indications may be performed as noted.  +---------+---------------+---------+-----------+----------+--------------+ RIGHT    CompressibilityPhasicitySpontaneityPropertiesThrombus Aging +---------+---------------+---------+-----------+----------+--------------+ CFV      Full           Yes      Yes                                 +---------+---------------+---------+-----------+----------+--------------+  SFJ      Full                                                        +---------+---------------+---------+-----------+----------+--------------+ FV Prox  Full                                                        +---------+---------------+---------+-----------+----------+--------------+ FV Mid   Full                                                        +---------+---------------+---------+-----------+----------+--------------+ FV DistalFull                                                        +---------+---------------+---------+-----------+----------+--------------+ PFV      Full                                                        +---------+---------------+---------+-----------+----------+--------------+ POP      Full           Yes      Yes                                 +---------+---------------+---------+-----------+----------+--------------+ PTV      Full                                                        +---------+---------------+---------+-----------+----------+--------------+ PERO     Full                                                        +---------+---------------+---------+-----------+----------+--------------+   +---------+---------------+---------+-----------+----------+--------------+ LEFT     CompressibilityPhasicitySpontaneityPropertiesThrombus Aging +---------+---------------+---------+-----------+----------+--------------+ CFV      Full           Yes      Yes                                  +---------+---------------+---------+-----------+----------+--------------+ SFJ      Full                                                        +---------+---------------+---------+-----------+----------+--------------+  FV Prox  Full                                                        +---------+---------------+---------+-----------+----------+--------------+ FV Mid   Full                                                        +---------+---------------+---------+-----------+----------+--------------+ FV DistalFull                                                        +---------+---------------+---------+-----------+----------+--------------+ PFV      Full                                                        +---------+---------------+---------+-----------+----------+--------------+ POP      Full           Yes      Yes                                 +---------+---------------+---------+-----------+----------+--------------+ PTV      Full                                                        +---------+---------------+---------+-----------+----------+--------------+ PERO     Full                                                        +---------+---------------+---------+-----------+----------+--------------+     Summary: Right: There is no evidence of deep vein thrombosis in the lower extremity. Left: There is no evidence of deep vein thrombosis in the lower extremity.  *See table(s) above for measurements and observations. Electronically signed by Harold Barban MD on 09/29/2019 at 4:34:00 PM.    Final     Labs:  Basic Metabolic Panel: Recent Labs  Lab 10/07/19 0802 10/10/19 0557  NA 141 143  K 4.3 4.5  CL 111 107  CO2 22 26  GLUCOSE 91 99  BUN 18 22  CREATININE 1.08* 1.47*  CALCIUM 8.6* 8.3*    CBC: Recent Labs  Lab 10/07/19 0802  WBC 6.6  HGB 11.0*  HCT 34.8*  MCV 98.3  PLT 370    CBG: No results for input(s): GLUCAP  in the last 168 hours.  Family history.  Father with metastatic oropharyngeal cancer.  Mother with CAD as well as hyperlipidemia.  Denies any diabetes mellitus or colon cancer  Brief HPI:   Christina Morgan  is a 85 y.o. right-handed female with history of chronic diastolic congestive heart failure, endometrial cancer followed by Dr. Polly Cobia in [place] maintained on Megace as well as Nolvadex, chronic back pain with kyphoplasty, CKD stage III, lymphedema, hypertension, PAF maintained on Eliquis as well as Tambocor.  Per chart review lives alone 1 level home modified independent with a walker prior to admission.  She has a daughter who checks on her routinely.  Presented 09/23/2019 after reported fall without loss of consciousness.  Denied chest pain or shortness of breath.  Noted BUN 21 creatinine 1.49 hemoglobin 11 for Covid negative.  Echocardiogram showed ejection fraction of 70%.  X-rays and imaging revealed right subtrochanteric hip fracture.  Underwent IM nailing of ORIF 09/24/2019 per Dr. Marcelino Scot.  Hospital course pain management weightbearing as tolerated.  Chronic Eliquis resumed postoperatively.  Acute blood loss anemia 7.4 transfused 2 units of packed red blood cells.  Patient was admitted for a comprehensive rehab program   Hospital Course: Christina Morgan was admitted to rehab 09/27/2019 for inpatient therapies to consist of PT, ST and OT at least three hours five days a week. Past admission physiatrist, therapy team and rehab RN have worked together to provide customized collaborative inpatient rehab.  Pertaining to patient right subtrochanteric hip fracture status post IM nailing ORIF 09/24/2019.  Weightbearing as tolerated she would follow-up orthopedic service Dr. Marcelino Scot.  Neurovascular sensation intact.  Chronic Eliquis resumed for PAF cardiac rate controlled.  Pain management use of hydrocodone.  Acute blood loss anemia stable 10.9.  In regards to patient's PAF she remained on  Eliquis as well as Tambocor and Tenormin she would follow-up with her primary MD.  Lipitor ongoing for hyperlipidemia.  Metabolic bone disease with vitamin D as directed.  CKD stage III latest creatinine 1.23-1.47 she did receive IV fluid 60 cc an hour x12 hours x1 day with lattest 1.34.  Chronic diastolic congestive heart failure she exhibited no signs of fluid overload she remained on low-dose Demadex.  Patient with long history of lymphedema she had a home health nurse in the past for leg wraps when needed.  She would follow-up outpatient Dr. Polly Cobia in [facility] for history of endometrial cancer she had been on Megace as well as Nolvadex as prior to admission.  Her blood pressure remained well controlled no orthostatic changes.  Drug-induced constipation resolved with laxative assistance.   Blood pressures were monitored on TID basis and stable  /She is continent of bowel and bladder.  Christina Morgan has made gains during rehab stay and is attending therapies  Christina Morgan will continue to receive follow up therapies   after discharge  Rehab course: During patient's stay in rehab weekly team conferences were held to monitor patient's progress, set goals and discuss barriers to discharge. At admission, patient required moderate assist supine to sit, total assist stand pivot transfers.  He/She  has had improvement in activity tolerance, balance, postural control as well as ability to compensate for deficits. He/She has had improvement in functional use RUE/LUE  and RLE/LLE as well as improvement in awareness.  Minimal assist upper body bathing max is lower body bathing minimal assist upper body dressing total assist lower body dressing  Physical exam.  Blood pressure 154/95 pulse 60 temperature 97.8 respirations 18 oxygen saturations 98% room air Constitutional alert and oriented well-developed HEENT Head.  Normocephalic and atraumatic Right ear external ear canal normal Left ear external ear canal  normal Eyes.  Pupils round and reactive to light no nystagmus Neck.  Supple nontender normal range of motion no tracheal deviation no thyromegaly Cardiovascular normal rate regular rhythm exam reveals no friction rub or murmur heard Respiratory effort normal no respiratory distress without wheezes GI.  Soft exhibits no distention nontender without rebound Musculoskeletal normal range of motion no deformity +1 to +2 right lower extremity edema Neurological.  Alert and oriented no cranial nerve deficits follows commands.  Motor strength 5 out of 5 upper extremities left lower extremity 4 - to 4 out of 5 right lower extremity 2 out of 5 except 4 out of 5 ankle dorsi plantarflexion.  No sensory findings. Skin.  Hip incision clean and dry staples were intact  Rehabilitation therapies.  Supine to sit with minimal assist.  Scoots in sitting with supervision.  Ambulates 10 feet including transition at doorway with contact-guard assist.  STS rolling walker bed elevated contact-guard assist.  Propels her wheelchair with supervision.  She could increase her ambulation back-and-forth 10 feet each way rolling walker minimal assistance.  Sit to supervision edge of bed for ADLs.  Performed upper body self-care with set up and minimal assist lower body self-care.  Family teaching completed plan discharge to home     Disposition: Discharge disposition: 01-Home or Self Care     Discharge to home   Diet: Regular  Special Instructions: No driving smoking or alcohol  Weightbearing as tolerated  Medications at discharge. 1.  Tylenol as needed 2.  Ventolin inhaler 2 puffs every 4 hours as needed 3.  Eliquis 2.5 mg p.o. twice daily 4.  Tenormin 25 mg p.o. twice daily 5.  Lipitor 10 mg p.o. nightly 6.  Vitamin D 2000 units p.o. twice daily 7.  Tambocor 100 mg p.o. twice daily 8.  Hydralazine 25 mg p.o. twice daily 9.  Hydrocodone 1 to 2 tablets every 6 hours as needed pain 10.  MiraLAX twice daily hold  for loose stools 11.  Nolvadex 20 mg daily x21 days alternating with Megace 12.  Demadex 10 mg p.o. daily 13.  Vitamin C 500 mg p.o. daily 14.  Vitamin D 50,000 units every 7 days 15.  Potassium chloride 20 mg p.o. twice daily   Follow-up Information    Lovorn, Jinny Blossom, MD Follow up.   Specialty: Physical Medicine and Rehabilitation Why: Only as needed Contact information: [address] [place] [address] [phone]        Altamese Monroe North, MD Follow up.   Specialty: Orthopedic Surgery Why: Call for appointment Contact information: [place] [address] [phone]           Signed: Lavon Paganini [place] 10/11/2019, 5:14 AM

## 2019-10-11 ENCOUNTER — Inpatient Hospital Stay (HOSPITAL_COMMUNITY): Payer: Medicare Other | Admitting: Physical Therapy

## 2019-10-11 ENCOUNTER — Inpatient Hospital Stay (HOSPITAL_COMMUNITY): Payer: Medicare Other | Admitting: Occupational Therapy

## 2019-10-11 LAB — BASIC METABOLIC PANEL
Anion gap: 8 (ref 5–15)
BUN: 21 mg/dL (ref 8–23)
CO2: 25 mmol/L (ref 22–32)
Calcium: 8.4 mg/dL — ABNORMAL LOW (ref 8.9–10.3)
Chloride: 109 mmol/L (ref 98–111)
Creatinine, Ser: 1.34 mg/dL — ABNORMAL HIGH (ref 0.44–1.00)
GFR calc Af Amer: 42 mL/min — ABNORMAL LOW (ref >60)
GFR calc non Af Amer: 36 mL/min — ABNORMAL LOW (ref >60)
Glucose, Bld: 100 mg/dL — ABNORMAL HIGH (ref 70–99)
Potassium: 4.3 mmol/L (ref 3.5–5.1)
Sodium: 142 mmol/L (ref 135–145)

## 2019-10-11 MED ORDER — ATORVASTATIN CALCIUM 10 MG PO TABS: 10.0000 mg | ORAL_TABLET | Freq: Every day | ORAL | 0 refills | Status: DC

## 2019-10-11 MED ORDER — ALBUTEROL SULFATE HFA 108 (90 BASE) MCG/ACT IN AERS: 2.0000 | INHALATION_SPRAY | RESPIRATORY_TRACT | 0 refills | Status: DC | PRN

## 2019-10-11 MED ORDER — VITAMIN D (ERGOCALCIFEROL) 1.25 MG (50000 UNIT) PO CAPS: 50000.0000 [IU] | ORAL_CAPSULE | ORAL | 0 refills | Status: DC

## 2019-10-11 MED ORDER — POLYETHYLENE GLYCOL 3350 17 G PO PACK: 17.0000 g | PACK | Freq: Two times a day (BID) | ORAL | 0 refills | Status: DC

## 2019-10-11 MED ORDER — ATENOLOL 25 MG PO TABS: 25.0000 mg | ORAL_TABLET | Freq: Two times a day (BID) | ORAL | 0 refills | Status: DC

## 2019-10-11 MED ORDER — FLECAINIDE ACETATE 100 MG PO TABS: 100.0000 mg | ORAL_TABLET | Freq: Two times a day (BID) | ORAL | 3 refills | Status: DC

## 2019-10-11 MED ORDER — VITAMIN D3 25 MCG PO TABS: 2000.0000 [IU] | ORAL_TABLET | Freq: Two times a day (BID) | ORAL | 0 refills | Status: DC

## 2019-10-11 MED ORDER — ACETAMINOPHEN 325 MG PO TABS: 325.0000 mg | ORAL_TABLET | Freq: Four times a day (QID) | ORAL | Status: DC | PRN

## 2019-10-11 MED ORDER — TORSEMIDE 10 MG PO TABS: 10.0000 mg | ORAL_TABLET | Freq: Every day | ORAL | 0 refills | Status: DC

## 2019-10-11 MED ORDER — APIXABAN 2.5 MG PO TABS: 2.5000 mg | ORAL_TABLET | Freq: Two times a day (BID) | ORAL | 0 refills | Status: DC

## 2019-10-11 MED ORDER — HYDRALAZINE HCL 25 MG PO TABS: 25.0000 mg | ORAL_TABLET | Freq: Two times a day (BID) | ORAL | 0 refills | Status: DC

## 2019-10-11 MED ORDER — HYDROCODONE-ACETAMINOPHEN 5-325 MG PO TABS: 1.0000 | ORAL_TABLET | Freq: Four times a day (QID) | ORAL | 0 refills | Status: DC | PRN

## 2019-10-11 NOTE — Progress Notes (Signed)
[facility] PHYSICAL MEDICINE & REHABILITATION PROGRESS NOTE  Subjective/Complaints:   Pt reports doesn't need Miralax- has been refusing it. LBM night of 10/28- not taking any Norco; denies SOB still.   ROS: Patient denies fever, rash, sore throat, blurred vision, nausea, vomiting, diarrhea, cough,  chest pain, joint or back pain, headache, or mood change.    Objective: Vital Signs: Blood pressure (!) 148/62, pulse 60, temperature (!) 97.5 F (36.4 C), resp. rate 16, weight 77.8 kg, SpO2 97 %. No results found. No results for input(s): WBC, HGB, HCT, PLT in the last 72 hours. Recent Labs    10/10/19 0557 10/11/19 0516  NA 143 142  K 4.5 4.3  CL 107 109  CO2 26 25  GLUCOSE 99 100*  BUN 22 21  CREATININE 1.47* 1.34*  CALCIUM 8.3* 8.4*    Physical Exam: BP (!) 148/62   Pulse 60   Temp (!) 97.5 F (36.4 C)   Resp 16   Wt 77.8 kg   SpO2 97%   BMI 27.68 kg/m  Constitutional: No distress . Vital signs  reviewed. Awake, alert, sitting up in bed; eating breakfast,  NAD HEENT: EOMI, oral membranes moist Neck: supple Cardiovascular: RRR without murmur. No JVD    Respiratory: CTA B/L no wheezes, rales, rhonchi- GI: BS +, non-tender, non-distended   Skin: wounds clean with sutures; R hip improved edema Vascular changes bilateral lower extremities--stable Psych: pleasant Musc: Bilateral, right LE 1+ Neurological: Alert Appropriate cognitively.  Motor:  Left lower extremity: Hip flexion, knee extension 4-/5, ankle dorsiflexion 4/5 Right lower extremity: Hip flexion, knee extension 3-/5, ankle dorsiflexion 4/5.   Assessment/Plan: 1. Functional deficits secondary to right subtrochanteric femur fracture which require 3+ hours per day of interdisciplinary therapy in a comprehensive inpatient rehab setting.  Physiatrist is providing close team supervision and 24 hour management of active medical problems listed below.  Physiatrist and rehab team continue to assess  barriers to discharge/monitor patient progress toward functional and medical goals  Care Tool:  Bathing  Bathing activity did not occur: Safety/medical concerns Body parts bathed by patient: Right arm, Left arm, Chest, Abdomen(UB only 4/4)   Body parts bathed by helper: Buttocks     Bathing assist Assist Level: Set up assist     Upper Body Dressing/Undressing Upper body dressing Upper body dressing/undressing activity did not occur (including orthotics): N/A(patient had a gown on) What is the patient wearing?: Pull over shirt    Upper body assist Assist Level: Supervision/Verbal cueing    Lower Body Dressing/Undressing Lower body dressing    Lower body dressing activity did not occur: N/A(no incontinent brief on) What is the patient wearing?: Pants, Incontinence brief     Lower body assist Assist for lower body dressing: Minimal Assistance - Patient > 75%     Toileting Toileting    Toileting assist Assist for toileting: Maximal Assistance - Patient 25 - 49%     Transfers Chair/bed transfer  Transfers assist  Chair/bed transfer activity did not occur: Safety/medical concerns  Chair/bed transfer assist level: Minimal Assistance - Patient > 75%     Locomotion Ambulation   Ambulation assist   Ambulation activity did not occur: Safety/medical concerns  Assist level: Minimal Assistance - Patient > 75% Assistive device: Walker-rolling Max distance: 10'   Walk 10 feet activity   Assist  Walk 10 feet activity did not occur: Safety/medical concerns  Assist level: Minimal Assistance - Patient > 75% Assistive device: Walker-rolling   Walk 50 feet activity  Assist Walk 50 feet with 2 turns activity did not occur: Safety/medical concerns         Walk 150 feet activity   Assist Walk 150 feet activity did not occur: Safety/medical concerns         Walk 10 feet on uneven surface  activity   Assist Walk 10 feet on uneven surfaces activity did not  occur: Safety/medical concerns         Wheelchair     Assist Will patient use wheelchair at discharge?: Yes Type of Wheelchair: Manual    Wheelchair assist level: Supervision/Verbal cueing Max wheelchair distance: 150'    Wheelchair 50 feet with 2 turns activity    Assist        Assist Level: Supervision/Verbal cueing   Wheelchair 150 feet activity     Assist     Assist Level: Supervision/Verbal cueing     Medical Problem List and Plan: 1.Decreased functional mobilitysecondary to right subtrochanteric hip fracture. Status post IM nailing with ORIF 09/24/2019.  Continue CIR PT, OT    2. Antithrombotics: -DVT/anticoagulation:Chronic Eliquis -antiplatelet therapy: N/A 3. Pain Management:Hydrocodone as needed  Improving pain control 4. Mood:Provide emotional support -antipsychotic agents: N/A 5. Neuropsych: This patientiscapable of making decisions on herown behalf. 6. Skin/Wound Care:remove sutures today  Prevalon boots ordered 7. Fluids/Electrolytes/Nutrition:Routine in and outs 8. Acute blood loss anemia.  Hemoglobin 10.9 on 10/19  9. PAF. Continue Eliquis as well as Tambocor 100 mg twice daily, Tenormin 12.5 mg twice daily. Cardiac rate controlled 10. Hyperlipidemia. Lipitor 11. Metabolic bone disease. Continue vitamin D supplements as directed 12. CKD stage III. Creatinine baseline 1.49.  Creatinine  1.23 10/23  10/29- Cr up to 1.47- will give IVFs 60cc/hr x 12 hrs and recheck in AM  10/30- Cr 1.34 decreased 13. Chronic diastolic congestive heart failure monitor for any signs of fluid overload. Followed by cardiology services.Demadex resumed    weight is good.  -BUN/Cr improved.    -resumed demadex 10/24  10/28- will recheck labs to make sure not drying out too much. 10/29- will reduce Demadex to 10 mg since kidney function is suffering Filed Weights   10/09/19 0542 10/10/19 0500  10/11/19 0500  Weight: 76.8 kg 77.4 kg 77.8 kg  14. Lymphedema.She did have a home health nurse to assist in leg wraps when needed -elevate leg in bed/chair -TEDS  -LLE looks good, no edema at present. I would resume wrapping as needed as an outpt. RLE more edematous at present d/t her recent fx/surgery 15.Endometrial cancer.Patient is followed by Dr. Polly Cobia in [place]. Patient maintained on Megace 80 mg 2 times daily takes for 3 weeks and alternates with Nolvadex20 mg daily for 3 weeks 16.  Essential hypertension   Tenormin 12.5 twice twice daily  Continue hydralazine 12.5 twice daily, increased to 25 twice daily on 10/18  10/25 SBP's remain elevated---increase tenormin to 25mg  twice daily  10/29- better control 17.  Drug-induced constipation  Bowel meds increased on 10/18  -moving bowels regularly now 18, New onset wheezing-  duoneb prn  10/26- required for 3rd time since here- ordered Albuterol inhalers since easier to use in home setting with spacer and ordered 2 puffs q4 hours prn- asked nursing to give next/before nebs.  10/27- Educated pt again on how to use inhaler.     LOS: 14 days A FACE TO FACE EVALUATION WAS PERFORMED  Takhia Spoon 10/11/2019, 8:29 AM

## 2019-10-11 NOTE — Progress Notes (Signed)
Occupational Therapy Discharge Summary  Patient Details  Name: Christina Morgan MRN: [id number] Date of Birth: 1933/08/31   Patient has met 9 of 9 long term goals due to improved activity tolerance, improved balance, postural control and improved coordination.  Patient to discharge at [facility] Assist- mod A level.  Patient's care partner is independent to provide the necessary physical assistance at discharge.  Pt's daughter has completed hands on family training. Demonstrates and voices willing and ableness to provide needed assist at d/c. Plan to hire private duty caregivers as well. Pt completing short distance functional ambulation and stand pivot transfers with RW and overall min A, occasional mod A when fatigued or from low surface. Min-mod A for bathing/dressing depending on set-up and pt's fatigue level. Extensive education provided throughout rehab admission regarding energy conservation techniques, importance of participation and independence with ADLs and ADL recommendations based on pt's CLOF. Recommend use of BSC for toileting task in order to allow room for caregiver to provide needed steadying-mod A for toileting task.  Recommendation:  Patient will benefit from ongoing skilled OT services in home health setting to continue to advance functional skills in the area of BADL and Reduce care partner burden.  Equipment: Pt has BSC and family plans to private purchase TTB  Reasons for discharge: treatment goals met and discharge from hospital  Patient/family agrees with progress made and goals achieved: Yes  OT Discharge Precautions/Restrictions  Precautions Precautions: Fall Restrictions Weight Bearing Restrictions: Yes RLE Weight Bearing: Weight bearing as tolerated Vision Baseline Vision/History: Wears glasses Wears Glasses: At all times Patient Visual Report: No change from baseline Vision Assessment?: No apparent visual deficits Perception  Perception: Within  Functional Limits Praxis Praxis: Intact Cognition Overall Cognitive Status: Within Functional Limits for tasks assessed Arousal/Alertness: Awake/alert Orientation Level: Oriented X4 Focused Attention: Appears intact Sustained Attention: Appears intact Memory: Appears intact Awareness: Appears intact Problem Solving: Appears intact Safety/Judgment: Appears intact Sensation Sensation Light Touch: Appears Intact Proprioception: Appears Intact Coordination Gross Motor Movements are Fluid and Coordinated: No Fine Motor Movements are Fluid and Coordinated: No Coordination and Movement Description: impaired due to R LE weakness and pain as well as overall generalized weakness and deconditioning, however, much improved since admission Motor  Motor Motor: Abnormal postural alignment and control;Other (comment) Motor - Discharge Observations: Generalized weakness/deconditioning; L lean with dynamic sitting/standing Trunk/Postural Assessment  Cervical Assessment Cervical Assessment: Exceptions to WFL(Forward head) Thoracic Assessment Thoracic Assessment: Exceptions to WFL(Kyphotic; Rounded shoulders) Lumbar Assessment Lumbar Assessment: Exceptions to WFL(Posterior pelvic tilt) Postural Control Postural Control: Deficits on evaluation Trunk Control: L lean with dynamic sitting/standing  Balance Balance Balance Assessed: Yes Static Sitting Balance Static Sitting - Balance Support: Feet supported;No upper extremity supported Static Sitting - Level of Assistance: 5: Stand by assistance Static Standing Balance Static Standing - Balance Support: During functional activity;Right upper extremity supported;Left upper extremity supported Static Standing - Level of Assistance: 5: Stand by assistance;4: Min assist Static Standing - Comment/# of Minutes: Standing at [employer] Extremity/Trunk Assessment RUE Assessment RUE Assessment: Within Functional Limits General Strength Comments: generalized  weakness LUE Assessment LUE Assessment: Within Functional Limits General Strength Comments: generalized weakness   Jamey Harman L 10/11/2019, 7:24 AM

## 2019-10-11 NOTE — Progress Notes (Signed)
Occupational Therapy Session Note  Patient Details  Name: Christina Morgan MRN: [id number] Date of Birth: 06-Jul-1933  Today's Date: 10/11/2019 OT Individual Time: 1100-1115 OT Individual Time Calculation (min): 15 min  OT Missed Time: 45 min (fatigue)  Short Term Goals: Week 2:  OT Short Term Goal 1 (Week 2): STG=LTG due to LOS  Skilled Therapeutic Interventions/Progress Updates:  Pt sitting up in w/c upon arrival, voiced fatigue and requesting to toilet then return to bed to rest and requesting therapist to return later in the day for shower.  Completed sit<>stand from w/c and BSC over toilet with min A. Ambulated into bathroom with min A using RW with VCs for RW management. Completed toileting task with steadying assist and assist to manage brief, however, pt able to complete hygiene and manage pants with steadying assist only.  She ambulated back out of bathroom to EOB. However, when turning to go to EOB, pt becoming weak, voiced feeling her legs were going to give out. Ultimately pt sitting on therapist's thigh while w/c brought behind pt to prevent fall.  Pt left sitting in w/c with daughter and PA present to review d/c summary while pt rested before returning to bed.  Will attempt to see pt later in the day.    Therapist returned at 1315. Pt in supine. Reports previous episode of emesis. Requests to cont to keep resting, hopefully to get up with PT later in the day. Pt left in supine with all needs in reach, bed alarm on.   Therapy Documentation Precautions:  Precautions Precautions: Fall Restrictions Weight Bearing Restrictions: Yes RLE Weight Bearing: Weight bearing as tolerated   Therapy/Group: Individual Therapy  Ahlayah Tarkowski L 10/11/2019, 7:07 AM

## 2019-10-11 NOTE — Discharge Instructions (Signed)
Inpatient Rehab Discharge Instructions  Christina Morgan Discharge date and time: No discharge date for patient encounter.   Activities/Precautions/ Functional Status: Activity: activity as tolerated Diet: regular diet Wound Care: keep wound clean and dry Functional status:  ___ No restrictions     ___ Walk up steps independently ___ 24/7 supervision/assistance   ___ Walk up steps with assistance ___ Intermittent supervision/assistance  ___ Bathe/dress independently ___ Walk with walker     ___ Bathe/dress with assistance ___ Walk Independently    ___ Shower independently ___ Walk with assistance    ___ Shower with assistance ___ No alcohol     ___ Return to work/school ________    COMMUNITY REFERRALS UPON DISCHARGE:    Home Health:   PT     OT                       Agency:  [facility]  Phone: [phone]   Medical Equipment/Items Ordered:  Wheelchair and cushion                                                       Agency/Supplier:  [place] @ [phone]     Special Instructions: No driving smoking or alcohol   My questions have been answered and I understand these instructions. I will adhere to these goals and the provided educational materials after my discharge from the hospital.  Patient/Caregiver Signature _______________________________ Date __________  Clinician Signature _______________________________________ Date __________  Please bring this form and your medication list with you to all your follow-up doctor's appointments.

## 2019-10-11 NOTE — Progress Notes (Signed)
Physical Therapy Session Note  Patient Details  Name: Christina Morgan MRN: [id number] Date of Birth: 31-Aug-1933  Today's Date: 10/11/2019 PT Individual Time: 0900-1000; 1415-1430 PT Individual Time Calculation (min): 60 min and 15 min PT Missed Time: 60 min Missed Time Reason: patient illness (nausea)  Short Term Goals: Week 2:  PT Short Term Goal 1 (Week 2): = LTGs due to ELOS  Skilled Therapeutic Interventions/Progress Updates:    Session 1: Pt received seated in bed, agreeable to PT session. Pt reports ongoing soreness in R hip, declines intervention. Supine to sit with min A. Sit to stand with min A to RW. Ambulation x 10 ft into bathroom with RW and min A. Pt is setup A for pericare, dependent for brief change. Ambulation x 10 ft from bathroom to w/c. Pt is dependent to don TED hose while seated in w/c. Sit to stand with min A to RW, max A to pull up pants in standing. Pt is able to perform oral hygiene while seated at sink independently. Manual w/c propulsion x 150 ft with use of BUE and Supervision, pt still requires assist for management of w/c parts. Car transfer with min A for RLE management and with use of RW at simulation height of car pt will transport home in. Ambulation across threshold with RW and min A, v/c for safety. Pt agreeable to stay seated in w/c in room with needs in reach at end of session.  Session 2: Pt received seated in bed, declines any OOB mobility this PM 2/2 ongoing nausea. Assisted pt with adjusting RLE ELR for improved fit and comfort. Performed MMT to BLE, see d/c note and flowsheet for details. Pt missed 60 min of scheduled therapy due to nausea. Pt left semi-reclined in bed with needs in reach at end of session.   Therapy Documentation Precautions:  Precautions Precautions: Fall Restrictions Weight Bearing Restrictions: Yes RLE Weight Bearing: Weight bearing as tolerated    Therapy/Group: Individual Therapy   Excell Seltzer, PT,  DPT  10/11/2019, 12:54 PM

## 2019-10-11 NOTE — Progress Notes (Signed)
Recreational Therapy Discharge Summary Patient Details  Name: FOTINI LEMUS MRN: [id number] Date of Birth: Feb 17, 1933 Today's Date: 10/11/2019  Long term goals set: 1  Long term goals met: 1  Comments on progress toward goals: Pt has made good progress during LOS and is ready for discharge home with family to provide/coordinate 24 hour supervision/assistance.  Pt completes simple TR tasks seated with set up assistance and simple standing tasks with min assist.  Pt fatigues quickly with standing needing frequent rest breaks.  Education provided on activity analysis, energy conservation.    Reasons for discharge: discharge from hospital   Patient/family agrees with progress made and goals achieved: Yes  Hula Tasso 10/11/2019, 8:40 AM

## 2019-10-11 NOTE — Progress Notes (Signed)
Physical Therapy Discharge Summary  Patient Details  Name: Christina Morgan MRN: [id number] Date of Birth: September 13, 1933  Today's Date: 10/11/2019  Patient has met 5 of 8 long term goals due to improved activity tolerance, improved balance, improved postural control and ability to compensate for deficits.  Patient to discharge at a wheelchair level [place] for transfers.   Patient's care partner is independent to provide the necessary physical assistance at discharge. Pt's daughter has completed hands on family training and is safe to provide overall min A to the patient upon d/c home.  Reasons goals not met: Pt did not meet sit to stand goal of CGA as she requires min A for this transfer. Pt did not meet bed mobility goal of CGA and is min A overall for this. Per pt she plans on sleeping in a recliner at home and will not need to be able to perform bed mobility. Pt did not meet gait goal of 25' as she is only able to ambulate x 15' maximum with RW and min A. Pt's daughter is aware of pt's current level of function and can provided needed assistance upon d/c home.  Recommendation:  Patient will benefit from ongoing skilled PT services in home health setting to continue to advance safe functional mobility, address ongoing impairments in strength, endurance, safety, independence with functional mobility, and minimize fall risk.  Equipment: 18x18 manual w/c with ELR; pt already owns RW  Reasons for discharge: treatment goals met and discharge from hospital  Patient/family agrees with progress made and goals achieved: Yes  PT Discharge Precautions/Restrictions Precautions Precautions: Fall Restrictions Weight Bearing Restrictions: Yes RLE Weight Bearing: Weight bearing as tolerated Vision/Perception  Perception Perception: Within Functional Limits Praxis Praxis: Intact  Cognition Overall Cognitive Status: Within Functional Limits for tasks assessed Arousal/Alertness:  Awake/alert Orientation Level: Oriented X4 Attention: Focused;Sustained Focused Attention: Appears intact Sustained Attention: Appears intact Memory: Appears intact Awareness: Appears intact Problem Solving: Appears intact Safety/Judgment: Appears intact Sensation Sensation Light Touch: Appears Intact Proprioception: Appears Intact Coordination Gross Motor Movements are Fluid and Coordinated: No Fine Motor Movements are Fluid and Coordinated: No Coordination and Movement Description: impaired due to R LE weakness and pain as well as overall generalized weakness and deconditioning, however, much improved since admission Motor  Motor Motor: Abnormal postural alignment and control;Other (comment) Motor - Skilled Clinical Observations: Generalized weakness and deconditioning with more significant R LE strength impairments Motor - Discharge Observations: Generalized weakness/deconditioning; L lean with dynamic sitting/standing  Mobility Bed Mobility Bed Mobility: Rolling Right;Rolling Left;Supine to Sit;Sit to Supine Rolling Right: Supervision/verbal cueing Rolling Left: Supervision/Verbal cueing Right Sidelying to Sit: Minimal Assistance - Patient > 75% Supine to Sit: Minimal Assistance - Patient > 75% Sit to Supine: Minimal Assistance - Patient > 75% Transfers Transfers: Sit to Stand;Stand Pivot Transfers Sit to Stand: Minimal Assistance - Patient > 75% Stand to Sit: Minimal Assistance - Patient > 75% Stand Pivot Transfers: Minimal Assistance - Patient > 75% Stand Pivot Transfer Details: Tactile cues for weight shifting;Verbal cues for precautions/safety Transfer (Assistive device): Rolling walker Locomotion  Gait Ambulation: Yes Gait Assistance: Minimal Assistance - Patient > 75% Gait Distance (Feet): 15 Feet Assistive device: Rolling walker Gait Assistance Details: Tactile cues for weight shifting;Verbal cues for precautions/safety Gait Gait: Yes Gait Pattern:  Impaired Gait Pattern: Step-to pattern;Decreased step length - right;Decreased stance time - left;Antalgic;Lateral trunk lean to left Gait velocity: decreased Stairs / Additional Locomotion Stairs: Yes Stairs Assistance: Moderate Assistance - Patient 50 - 74%  Stair Management Technique: Two rails;Step to pattern;Forwards Number of Stairs: 8 Height of Stairs: 3 Wheelchair Mobility Wheelchair Mobility: Yes Wheelchair Assistance: Chartered loss adjuster: Both upper extremities Wheelchair Parts Management: Needs assistance Distance: 150  Trunk/Postural Assessment  Cervical Assessment Cervical Assessment: Exceptions to WFL(forward head) Thoracic Assessment Thoracic Assessment: Exceptions to WFL(kyphotic; rounded shoulders) Lumbar Assessment Lumbar Assessment: Exceptions to WFL(posterior pelvic tilt) Postural Control Postural Control: Deficits on evaluation Trunk Control: L lean with dynamic sitting/standing Postural Limitations: dec, pt requires UE support  Balance Balance Balance Assessed: Yes Static Sitting Balance Static Sitting - Balance Support: Feet supported;No upper extremity supported Static Sitting - Level of Assistance: 5: Stand by assistance Static Standing Balance Static Standing - Balance Support: Bilateral upper extremity supported;During functional activity Static Standing - Level of Assistance: 5: Stand by assistance;4: Min assist Extremity Assessment   RLE Assessment RLE Assessment: Exceptions to [facility] General Strength Comments: see below RLE Strength Right Hip Flexion: 3/5 Right Knee Flexion: 4/5 Right Knee Extension: 4/5 Right Ankle Dorsiflexion: 4/5 LLE Assessment LLE Assessment: Exceptions to [facility] General Strength Comments: see below LLE Strength Left Hip Flexion: 4/5 Left Knee Flexion: 4/5 Left Knee Extension: 4/5 Left Ankle Dorsiflexion: 4/5     Excell Seltzer, PT, DPT 10/11/2019, 3:10 PM

## 2019-10-12 NOTE — Plan of Care (Signed)
  Problem: Consults Goal: RH GENERAL PATIENT EDUCATION Description: See Patient Education module for education specifics. Outcome: Completed/Met Goal: Skin Care Protocol Initiated - if Braden Score 18 or less Description: If consults are not indicated, leave blank or document N/A Outcome: Completed/Met   Problem: RH BOWEL ELIMINATION Goal: RH STG MANAGE BOWEL WITH ASSISTANCE Description: STG Manage Bowel with mod I Assistance. Outcome: Completed/Met   Problem: RH SKIN INTEGRITY Goal: RH STG SKIN FREE OF INFECTION/BREAKDOWN Description: Pt will be free of infection and skin breakdown during admission Outcome: Completed/Met Goal: RH STG MAINTAIN SKIN INTEGRITY WITH ASSISTANCE Description: STG Maintain Skin Integrity With mod I Assistance. Outcome: Completed/Met   Problem: RH SAFETY Goal: RH STG ADHERE TO SAFETY PRECAUTIONS W/ASSISTANCE/DEVICE Description: STG Adhere to Safety Precautions With min Assistance/Device. Outcome: Completed/Met   Problem: RH PAIN MANAGEMENT Goal: RH STG PAIN MANAGED AT OR BELOW PT'S PAIN GOAL Description: Pain scale <4/10 Outcome: Completed/Met   Problem: RH KNOWLEDGE DEFICIT GENERAL Goal: RH STG INCREASE KNOWLEDGE OF SELF CARE AFTER HOSPITALIZATION Description: Pt and family will be demonstrate fall and safety precautions in the home and follow hip precautions also monitor for sign of infection prior to DC Outcome: Completed/Met

## 2019-10-12 NOTE — Progress Notes (Signed)
Patient discharged to home with daughter at 7. Patient discharged with all belongings and discharge instructions given yesterday via Silvestre Mesi PA. All questions answered. Patient discharged with wheelchair.

## 2019-10-13 DIAGNOSIS — C55 Malignant neoplasm of uterus, part unspecified: Secondary | ICD-10-CM | POA: Diagnosis not present

## 2019-10-13 DIAGNOSIS — I10 Essential (primary) hypertension: Secondary | ICD-10-CM | POA: Diagnosis not present

## 2019-10-13 DIAGNOSIS — L89892 Pressure ulcer of other site, stage 2: Secondary | ICD-10-CM | POA: Diagnosis not present

## 2019-10-13 DIAGNOSIS — I5032 Chronic diastolic (congestive) heart failure: Secondary | ICD-10-CM | POA: Diagnosis not present

## 2019-10-13 DIAGNOSIS — Z79810 Long term (current) use of selective estrogen receptor modulators (SERMs): Secondary | ICD-10-CM | POA: Diagnosis not present

## 2019-10-13 DIAGNOSIS — L89152 Pressure ulcer of sacral region, stage 2: Secondary | ICD-10-CM | POA: Diagnosis not present

## 2019-10-14 NOTE — Progress Notes (Signed)
Social Work Discharge Note   The overall goal for the admission was met for:   Discharge location: Yes - home with daughter and private duty caregivers providing 24/7 support.  Length of Stay: Yes - 15 days  Discharge activity level: Yes - minimal assistance overall  Home/community participation: Yes  Services provided included: MD, RD, PT, OT, SLP, RN, TR, Pharmacy and SW  Financial Services: Medicare and Private Insurance: [place]  Follow-up services arranged: Home Health: PT, OT via [employer], DME: 18x18 lightweight w/c with ELRs, cushion via [employer] and Patient/Family has no preference for HH/DME agencies  Comments (or additional information):    Daughter, Yerlin Gasparyan @ [phone]  Patient/Family verbalized understanding of follow-up arrangements: Yes  Individual responsible for coordination of the follow-up plan: pt  Confirmed correct DME delivered: Lennart Pall 10/14/2019    Maika Mcelveen

## 2019-10-15 DIAGNOSIS — I10 Essential (primary) hypertension: Secondary | ICD-10-CM | POA: Diagnosis not present

## 2019-10-15 DIAGNOSIS — I5032 Chronic diastolic (congestive) heart failure: Secondary | ICD-10-CM | POA: Diagnosis not present

## 2019-10-15 DIAGNOSIS — Z79810 Long term (current) use of selective estrogen receptor modulators (SERMs): Secondary | ICD-10-CM | POA: Diagnosis not present

## 2019-10-15 DIAGNOSIS — C55 Malignant neoplasm of uterus, part unspecified: Secondary | ICD-10-CM | POA: Diagnosis not present

## 2019-10-15 DIAGNOSIS — L89892 Pressure ulcer of other site, stage 2: Secondary | ICD-10-CM | POA: Diagnosis not present

## 2019-10-15 DIAGNOSIS — L89152 Pressure ulcer of sacral region, stage 2: Secondary | ICD-10-CM | POA: Diagnosis not present

## 2019-10-16 DIAGNOSIS — I5032 Chronic diastolic (congestive) heart failure: Secondary | ICD-10-CM | POA: Diagnosis not present

## 2019-10-16 DIAGNOSIS — I10 Essential (primary) hypertension: Secondary | ICD-10-CM | POA: Diagnosis not present

## 2019-10-16 DIAGNOSIS — C55 Malignant neoplasm of uterus, part unspecified: Secondary | ICD-10-CM | POA: Diagnosis not present

## 2019-10-16 DIAGNOSIS — L89892 Pressure ulcer of other site, stage 2: Secondary | ICD-10-CM | POA: Diagnosis not present

## 2019-10-16 DIAGNOSIS — Z79810 Long term (current) use of selective estrogen receptor modulators (SERMs): Secondary | ICD-10-CM | POA: Diagnosis not present

## 2019-10-16 DIAGNOSIS — L89152 Pressure ulcer of sacral region, stage 2: Secondary | ICD-10-CM | POA: Diagnosis not present

## 2019-10-17 DIAGNOSIS — I10 Essential (primary) hypertension: Secondary | ICD-10-CM | POA: Diagnosis not present

## 2019-10-17 DIAGNOSIS — L89152 Pressure ulcer of sacral region, stage 2: Secondary | ICD-10-CM | POA: Diagnosis not present

## 2019-10-17 DIAGNOSIS — Z79810 Long term (current) use of selective estrogen receptor modulators (SERMs): Secondary | ICD-10-CM | POA: Diagnosis not present

## 2019-10-17 DIAGNOSIS — L89892 Pressure ulcer of other site, stage 2: Secondary | ICD-10-CM | POA: Diagnosis not present

## 2019-10-17 DIAGNOSIS — C55 Malignant neoplasm of uterus, part unspecified: Secondary | ICD-10-CM | POA: Diagnosis not present

## 2019-10-17 DIAGNOSIS — I5032 Chronic diastolic (congestive) heart failure: Secondary | ICD-10-CM | POA: Diagnosis not present

## 2019-10-21 ENCOUNTER — Encounter: Payer: Self-pay | Admitting: Cardiology

## 2019-10-21 ENCOUNTER — Other Ambulatory Visit: Payer: Self-pay

## 2019-10-21 ENCOUNTER — Ambulatory Visit (INDEPENDENT_AMBULATORY_CARE_PROVIDER_SITE_OTHER): Payer: Medicare Other | Admitting: Cardiology

## 2019-10-21 VITALS — BP 130/58 | HR 56 | Ht 66.0 in

## 2019-10-21 DIAGNOSIS — R0602 Shortness of breath: Secondary | ICD-10-CM

## 2019-10-21 DIAGNOSIS — N1831 Chronic kidney disease, stage 3a: Secondary | ICD-10-CM

## 2019-10-21 DIAGNOSIS — I480 Paroxysmal atrial fibrillation: Secondary | ICD-10-CM

## 2019-10-21 DIAGNOSIS — I5032 Chronic diastolic (congestive) heart failure: Secondary | ICD-10-CM

## 2019-10-21 DIAGNOSIS — I10 Essential (primary) hypertension: Secondary | ICD-10-CM

## 2019-10-21 DIAGNOSIS — R600 Localized edema: Secondary | ICD-10-CM | POA: Diagnosis not present

## 2019-10-21 NOTE — Patient Instructions (Signed)
Medication Instructions:  ?Your physician recommends that you continue on your current medications as directed. Please refer to the Current Medication list given to you today. ? ?*If you need a refill on your cardiac medications before your next appointment, please call your pharmacy* ? ?Follow-Up: ?At [facility], you and your health needs are our priority.  As part of our continuing mission to provide you with exceptional heart care, we have created designated Provider Care Teams.  These Care Teams include your primary Cardiologist (physician) and Advanced Practice Providers (APPs -  Physician Assistants and Nurse Practitioners) who all work together to provide you with the care you need, when you need it.  ? ?Your next appointment:   ?3 month(s) ? ?The format for your next appointment:   ?In Person ? ?Provider:   ?Traci Turner, MD ? ?

## 2019-10-21 NOTE — Progress Notes (Signed)
Cardiology Office Note:    Date:  10/21/2019   ID:  Christina Morgan, DOB Jul 25, 1933, MRN [id number]  PCP:  Seward Carol, MD  Cardiologist:  Fransico Him, MD    Referring MD: Seward Carol, MD   Chief Complaint  Patient presents with  . Congestive Heart Failure  . Hypertension  . Atrial Fibrillation    History of Present Illness:    Christina Morgan is a 85 y.o. female with a hx of paroxysmal atrial fibrillation on Eliquis and flecainide, hypertension, hyperlipidemia, and chronic lower extremity edema. Shealso has a history of chronic diastolic CHF withEchoshowingLVEF 55-60% with elevated ventricular filling pressures and atrial filling pressures, trivial pericardial effusionand moderate aortic insufficiency. She also has chronic lower extremity edema.  She was seen by Christina Copa, PA 06/07/2019 for worsening LE edema and had been treated with antbx for possible cellulitis.  Her lasix was also increased but LE edema persisted.  She had been very sedentary and was felt to have a component of chronic venous insuff exacerbated by amlodipine.  Her amlodipine was decreased from 10mg  to 5mg  daily.  Her Lasix was subsequently changed to Demadex 40mg  qam and 2-mg qpm.  Unfortunately she is unable to get TED stockings on her legs. She is also very sedentary and sits a lot in her recliner but cannot get it recline enough to keep her feet above the level of her heart.  She has chronic DOE that is felt to be due to sedentary state and deconditioned.   She was recently in the hospital after sustaining and fall resulting in hip fx.  She was going to the bathroom and says that all of a sudden she got dizzy and the wall started to move.  The next thing she knew she was on the floor and apparently had gone to the bathroom on the bathroom floor.  In the ER it was felt she was dehydrated.  She was seen by Cards and her Atenolol was decreased to 25mg  BID and Torsemide initially held and then  restarted after hip repair at 10mg  daily. Her hydralazine was changed to 25mg  BID.    She is home and doing well with home health and PT.  She is now wearing thigh high compression hose and this has helped significantly with her LE edema. She has chronic SOB that remains unchanged. She denies any  PND, orthopnea or palpitations and no further dizziness or syncope. She is compliant with her meds and is tolerating meds with no SE.    Past Medical History:  Diagnosis Date  . Cancer [facility])    endometrial ca  . Chronic diastolic CHF (congestive heart failure) ([place])   . Chronic edema   . CKD (chronic kidney disease), stage III   . Dyslipidemia   . Fracture of left humerus   . History of cardiovascular stress test    Lexiscan Myoview (06/2014): No ischemia or scar, not gated, low risk  . Hx of echocardiogram    Echo (05/02/14): EF 60% to 65%. No regional wall motion abnormalities. Mild AI, mildly dilated aortic root (37 mm), trivial MR, trivial TR  . Hyperlipidemia   . Hypertension   . Lipoma of skin    multiple  . Osteopenia   . PAF (paroxysmal atrial fibrillation) ([place])    failed DCCV 05/2014 >> Flecainide started >> DCCV 7/15 sucessful;  f/u ETT neg for pro-arrhythmia >> recurrent AF/AFL >> Flecainide inc to 100 bid with repeat DCCV 9/15  . Vitamin D  deficiency 09/25/2019    Past Surgical History:  Procedure Laterality Date  . CARDIOVERSION N/A 06/05/2014   Procedure: CARDIOVERSION;  Surgeon: Christina Margarita, MD;  Location: [place];  Service: Cardiovascular;  Laterality: N/A;  . CARDIOVERSION N/A 07/11/2014   Procedure: CARDIOVERSION;  Surgeon: Christina Margarita, MD;  Location: [facility] ENDOSCOPY;  Service: Cardiovascular;  Laterality: N/A;  . CARDIOVERSION N/A 09/04/2014   Procedure: CARDIOVERSION;  Surgeon: Christina Margarita, MD;  Location: [facility] ENDOSCOPY;  Service: Cardiovascular;  Laterality: N/A;  . COLONOSCOPY  2008  . COLONOSCOPY N/A 05/20/2015   Procedure: COLONOSCOPY;  Surgeon: Christina Lobo,  MD;  Location: [facility] ENDOSCOPY;  Service: Endoscopy;  Laterality: N/A;  . FEMUR IM NAIL Right 09/24/2019   Procedure: INTRAMEDULLARY (IM) NAIL FEMORAL;  Surgeon: Altamese [place], MD;  Location: [place];  Service: Orthopedics;  Laterality: Right;  . IR KYPHO EA ADDL LEVEL THORACIC OR LUMBAR  04/12/2019  . IR KYPHO THORACIC WITH BONE BIOPSY  04/12/2019  . ROBOTIC ASSISTED SUPRACERVICAL HYSTERECTOMY WITH BILATERAL SALPINGO OOPHERECTOMY  11/06/2012   and bilateral pelvic lymph node dissection    Current Medications: Current Meds  Medication Sig  . albuterol (VENTOLIN HFA) 108 (90 Base) MCG/ACT inhaler Inhale 2 puffs into the lungs every 4 (four) hours as needed for wheezing or shortness of breath (please try to use ALbuterol inhaler PRIOR to [place]).  Marland Kitchen apixaban (ELIQUIS) 2.5 MG TABS tablet Take 1 tablet (2.5 mg total) by mouth 2 (two) times daily.  Marland Kitchen atenolol (TENORMIN) 25 MG tablet Take 1 tablet (25 mg total) by mouth 2 (two) times daily.  Marland Kitchen atorvastatin (LIPITOR) 10 MG tablet Take 1 tablet (10 mg total) by mouth at bedtime.  . docusate sodium (COLACE) 100 MG capsule Take 1 capsule (100 mg total) by mouth 2 (two) times daily.  . flecainide (TAMBOCOR) 100 MG tablet Take 1 tablet (100 mg total) by mouth 2 (two) times daily.  . hydrALAZINE (APRESOLINE) 25 MG tablet Take 1 tablet (25 mg total) by mouth 2 (two) times daily.  . megestrol (MEGACE) 40 MG tablet Take 80 mg by mouth 2 (two) times daily. Take for 3 weeks , then alternate with tamoxifen 20 mg 1 daily for 3 weeks, etc.  . potassium chloride SA (K-DUR) 20 MEQ tablet Take 1 tablet (20 mEq total) by mouth 2 (two) times daily.  . tamoxifen (NOLVADEX) 20 MG tablet Take 20 mg by mouth 2 (two) times daily. Alternate every 3 weeks with megestrol.  . torsemide (DEMADEX) 10 MG tablet Take 1 tablet (10 mg total) by mouth daily.  . vitamin C (VITAMIN C) 500 MG tablet Take 1 tablet (500 mg total) by mouth daily.  . Vitamin D, Ergocalciferol, (DRISDOL) 1.25 MG  (50000 UT) CAPS capsule Take 1 capsule (50,000 Units total) by mouth every 7 (seven) days.  . Vitamin D3 (VITAMIN D) 25 MCG tablet Take 2 tablets (2,000 Units total) by mouth 2 (two) times daily.     Allergies:   Penicillins   Social History   Socioeconomic History  . Marital status: Widowed    Spouse name: Not on file  . Number of children: Not on file  . Years of education: Not on file  . Highest education level: Not on file  Occupational History  . Not on file  Social Needs  . Financial resource strain: Not hard at all  . Food insecurity    Worry: Patient refused    Inability: Patient refused  . Transportation needs  Medical: Patient refused    Non-medical: Patient refused  Tobacco Use  . Smoking status: Never Smoker  . Smokeless tobacco: Never Used  Substance and Sexual Activity  . Alcohol use: No  . Drug use: No  . Sexual activity: Not on file  Lifestyle  . Physical activity    Days per week: 0 days    Minutes per session: 0 min  . Stress: Only a little  Relationships  . Social connections    Talks on phone: More than three times a week    Gets together: More than three times a week    Attends religious service: More than 4 times per year    Active member of club or organization: Yes    Attends meetings of clubs or organizations: 1 to 4 times per year    Relationship status: Widowed  Other Topics Concern  . Not on file  Social History Narrative  . Not on file     Family History: The patient's family history includes Cancer (age of onset: 50) in her father; Coronary artery disease in her mother; Heart attack in her mother.  ROS:   Please see the history of present illness.    ROS  All other systems reviewed and negative.   EKGs/Labs/Other Studies Reviewed:    The following studies were reviewed today: none  EKG:  EKG is not ordered today.    Recent Labs: 01/04/2019: NT-Pro BNP 593 09/26/2019: TSH 5.394 09/30/2019: ALT 17; B Natriuretic Peptide  270.5 10/07/2019: Hemoglobin 11.0; Platelets 370 10/11/2019: BUN 21; Creatinine, Ser 1.34; Potassium 4.3; Sodium 142   Recent Lipid Panel    Component Value Date/Time   CHOL 175 03/21/2016 0915   TRIG 155 (H) 03/21/2016 0915   HDL 63 03/21/2016 0915   CHOLHDL 2.8 03/21/2016 0915   VLDL 31 (H) 03/21/2016 0915   LDLCALC 81 03/21/2016 0915    Physical Exam:    VS:  BP (!) 130/58   Pulse (!) 56   Ht 5\' 6"  (1.676 m)   SpO2 98%   BMI 27.19 kg/m     Wt Readings from Last 3 Encounters:  10/12/19 168 lb 6.9 oz (76.4 kg)  09/24/19 178 lb 5.6 oz (80.9 kg)  07/04/19 176 lb 3.2 oz (79.9 kg)     GEN:  Well nourished, well developed in no acute distress HEENT: Normal NECK: No JVD; No carotid bruits LYMPHATICS: No lymphadenopathy CARDIAC: RRR, no murmurs, rubs, gallops RESPIRATORY:  Clear to auscultation without rales, wheezing or rhonchi  ABDOMEN: Soft, non-tender, non-distended MUSCULOSKELETAL:  1-2+ edema; No deformity  SKIN: Warm and dry NEUROLOGIC:  Alert and oriented x 3 PSYCHIATRIC:  Normal affect   ASSESSMENT:    1. Chronic diastolic heart failure ([place])   2. PAF (paroxysmal atrial fibrillation) ([place])   3. Essential hypertension, benign   4. Lower extremity edema   5. SOB (shortness of breath)   6. Stage 3a chronic kidney disease    PLAN:    In order of problems listed above:  1.  Chronic diastolic CHF -2D echo with normal LVF and diastolic dysfunction -had been on Torsemide 40mg  BID but got volume depleted and had dizziness with what sounds like a syncopal episode resulting in hip fx -now on Torsemide 10mg  daily - still has LE edema but much improved on thigh high compression hose -I told her daughter that if she eats something too salty and gets increased edema it is ok to take an extra demadex on occasion -  keep on Torsemide 10mg  daily -creatinine stable -continue <2gm Na diet  2.  Persistent atrial fibrillation -maintaining NSR -cotintue Atenolol 25mg  BID,  Flecainide 100mg  BID and Apixaban -Creatinine 1.34 and Hbg 11 last month  3.  HTN -BP controlled -continue Atenolol 25mg  BID and Hydralazine 25mg  BID  4.  Chronic LE edema -much improved on thigh high compression hose -exacerbated by dietary indiscretion with Na as well as chronic venous insuff and sitting all day with legs hanging down  5.  Chronic DOE -2D echo 01/2019 showed normal LVF with increased stiffness of heart muscle, severe LAE -lexiscan myoview 12/2018 showed no ischemia -likely multifactorial from sedentary state and obesity -appears stable  6.  CKD stage 3a -creatinine stable at 1.34  Medication Adjustments/Labs and Tests Ordered: Current medicines are reviewed at length with the patient today.  Concerns regarding medicines are outlined above.  No orders of the defined types were placed in this encounter.  No orders of the defined types were placed in this encounter.   Signed, Fransico Him, MD  10/21/2019 11:55 AM    [place]

## 2019-10-22 DIAGNOSIS — C55 Malignant neoplasm of uterus, part unspecified: Secondary | ICD-10-CM | POA: Diagnosis not present

## 2019-10-22 DIAGNOSIS — I10 Essential (primary) hypertension: Secondary | ICD-10-CM | POA: Diagnosis not present

## 2019-10-22 DIAGNOSIS — I5032 Chronic diastolic (congestive) heart failure: Secondary | ICD-10-CM | POA: Diagnosis not present

## 2019-10-22 DIAGNOSIS — L89892 Pressure ulcer of other site, stage 2: Secondary | ICD-10-CM | POA: Diagnosis not present

## 2019-10-22 DIAGNOSIS — Z79810 Long term (current) use of selective estrogen receptor modulators (SERMs): Secondary | ICD-10-CM | POA: Diagnosis not present

## 2019-10-22 DIAGNOSIS — L89152 Pressure ulcer of sacral region, stage 2: Secondary | ICD-10-CM | POA: Diagnosis not present

## 2019-10-23 DIAGNOSIS — L89892 Pressure ulcer of other site, stage 2: Secondary | ICD-10-CM | POA: Diagnosis not present

## 2019-10-23 DIAGNOSIS — Z79810 Long term (current) use of selective estrogen receptor modulators (SERMs): Secondary | ICD-10-CM | POA: Diagnosis not present

## 2019-10-23 DIAGNOSIS — L89152 Pressure ulcer of sacral region, stage 2: Secondary | ICD-10-CM | POA: Diagnosis not present

## 2019-10-23 DIAGNOSIS — S7221XD Displaced subtrochanteric fracture of right femur, subsequent encounter for closed fracture with routine healing: Secondary | ICD-10-CM | POA: Diagnosis not present

## 2019-10-23 DIAGNOSIS — I10 Essential (primary) hypertension: Secondary | ICD-10-CM | POA: Diagnosis not present

## 2019-10-23 DIAGNOSIS — I5032 Chronic diastolic (congestive) heart failure: Secondary | ICD-10-CM | POA: Diagnosis not present

## 2019-10-23 DIAGNOSIS — C55 Malignant neoplasm of uterus, part unspecified: Secondary | ICD-10-CM | POA: Diagnosis not present

## 2019-10-24 DIAGNOSIS — L89892 Pressure ulcer of other site, stage 2: Secondary | ICD-10-CM | POA: Diagnosis not present

## 2019-10-24 DIAGNOSIS — I4891 Unspecified atrial fibrillation: Secondary | ICD-10-CM | POA: Diagnosis not present

## 2019-10-24 DIAGNOSIS — Z79810 Long term (current) use of selective estrogen receptor modulators (SERMs): Secondary | ICD-10-CM | POA: Diagnosis not present

## 2019-10-24 DIAGNOSIS — R2689 Other abnormalities of gait and mobility: Secondary | ICD-10-CM | POA: Diagnosis not present

## 2019-10-24 DIAGNOSIS — I5032 Chronic diastolic (congestive) heart failure: Secondary | ICD-10-CM | POA: Diagnosis not present

## 2019-10-24 DIAGNOSIS — L89152 Pressure ulcer of sacral region, stage 2: Secondary | ICD-10-CM | POA: Diagnosis not present

## 2019-10-24 DIAGNOSIS — G609 Hereditary and idiopathic neuropathy, unspecified: Secondary | ICD-10-CM | POA: Diagnosis not present

## 2019-10-24 DIAGNOSIS — S72141A Displaced intertrochanteric fracture of right femur, initial encounter for closed fracture: Secondary | ICD-10-CM | POA: Diagnosis not present

## 2019-10-24 DIAGNOSIS — I10 Essential (primary) hypertension: Secondary | ICD-10-CM | POA: Diagnosis not present

## 2019-10-24 DIAGNOSIS — Z8781 Personal history of (healed) traumatic fracture: Secondary | ICD-10-CM | POA: Diagnosis not present

## 2019-10-24 DIAGNOSIS — C55 Malignant neoplasm of uterus, part unspecified: Secondary | ICD-10-CM | POA: Diagnosis not present

## 2019-10-28 ENCOUNTER — Telehealth: Payer: Medicare Other | Admitting: Cardiology

## 2019-10-28 DIAGNOSIS — I10 Essential (primary) hypertension: Secondary | ICD-10-CM | POA: Diagnosis not present

## 2019-10-28 DIAGNOSIS — I5032 Chronic diastolic (congestive) heart failure: Secondary | ICD-10-CM | POA: Diagnosis not present

## 2019-10-28 DIAGNOSIS — L89152 Pressure ulcer of sacral region, stage 2: Secondary | ICD-10-CM | POA: Diagnosis not present

## 2019-10-28 DIAGNOSIS — L89892 Pressure ulcer of other site, stage 2: Secondary | ICD-10-CM | POA: Diagnosis not present

## 2019-10-28 DIAGNOSIS — C55 Malignant neoplasm of uterus, part unspecified: Secondary | ICD-10-CM | POA: Diagnosis not present

## 2019-10-28 DIAGNOSIS — Z79810 Long term (current) use of selective estrogen receptor modulators (SERMs): Secondary | ICD-10-CM | POA: Diagnosis not present

## 2019-11-11 ENCOUNTER — Other Ambulatory Visit: Payer: Self-pay

## 2019-11-11 MED ORDER — ATENOLOL 25 MG PO TABS: 25.0000 mg | ORAL_TABLET | Freq: Two times a day (BID) | ORAL | 3 refills | Status: DC

## 2019-11-11 MED ORDER — HYDRALAZINE HCL 25 MG PO TABS: 25.0000 mg | ORAL_TABLET | Freq: Two times a day (BID) | ORAL | 3 refills | Status: DC

## 2019-11-11 NOTE — Telephone Encounter (Addendum)
Prescription refill request for Eliquis received.  Last office visit: 10/21/2019, Turner Scr: 1.34, 10/11/2019 Age: 85 y.o. Weight: 79.9kg  Pt qualifies for Eliquis 5mg . Discussed and reviewed refill request with Pharm D. Messaged Dr. Radford Pax, will increase pt's Eliquis to 5mg  BID. Called pt to update her and make her aware of dose change. Spoke to pt's daughter who stated pt was sent assisted living yesterday and might not need a refill. Pt's daughter stated to call Imane, the nurse at the assisted living facility who would be in charge of pt's medications. Called Imane at [phone] and [facility] to call coumadin clinic back.    12/3/202: Romie Minus and LMOM to call coumadin clinic back. Called and spoke to pt's daughter and made her aware that we have not gotten in touch with the assisted living facility to make them aware of the dose change.

## 2019-11-12 DIAGNOSIS — Z03818 Encounter for observation for suspected exposure to other biological agents ruled out: Secondary | ICD-10-CM | POA: Diagnosis not present

## 2019-11-18 NOTE — Telephone Encounter (Signed)
Called and spoke to Assistou, from the assisted living facility. She stated that pt is already taking Eliquis 5mg  BID and dose not need a refill at this time.

## 2019-11-20 DIAGNOSIS — Z1159 Encounter for screening for other viral diseases: Secondary | ICD-10-CM | POA: Diagnosis not present

## 2019-11-20 DIAGNOSIS — Z20828 Contact with and (suspected) exposure to other viral communicable diseases: Secondary | ICD-10-CM | POA: Diagnosis not present

## 2019-11-23 DIAGNOSIS — Z79810 Long term (current) use of selective estrogen receptor modulators (SERMs): Secondary | ICD-10-CM | POA: Diagnosis not present

## 2019-11-23 DIAGNOSIS — L89152 Pressure ulcer of sacral region, stage 2: Secondary | ICD-10-CM | POA: Diagnosis not present

## 2019-11-23 DIAGNOSIS — I4891 Unspecified atrial fibrillation: Secondary | ICD-10-CM | POA: Diagnosis not present

## 2019-11-23 DIAGNOSIS — I10 Essential (primary) hypertension: Secondary | ICD-10-CM | POA: Diagnosis not present

## 2019-11-23 DIAGNOSIS — Z8781 Personal history of (healed) traumatic fracture: Secondary | ICD-10-CM | POA: Diagnosis not present

## 2019-11-23 DIAGNOSIS — G609 Hereditary and idiopathic neuropathy, unspecified: Secondary | ICD-10-CM | POA: Diagnosis not present

## 2019-11-23 DIAGNOSIS — S72141A Displaced intertrochanteric fracture of right femur, initial encounter for closed fracture: Secondary | ICD-10-CM | POA: Diagnosis not present

## 2019-11-23 DIAGNOSIS — I5032 Chronic diastolic (congestive) heart failure: Secondary | ICD-10-CM | POA: Diagnosis not present

## 2019-11-23 DIAGNOSIS — L89892 Pressure ulcer of other site, stage 2: Secondary | ICD-10-CM | POA: Diagnosis not present

## 2019-11-23 DIAGNOSIS — C55 Malignant neoplasm of uterus, part unspecified: Secondary | ICD-10-CM | POA: Diagnosis not present

## 2019-11-23 DIAGNOSIS — R2689 Other abnormalities of gait and mobility: Secondary | ICD-10-CM | POA: Diagnosis not present

## 2019-11-25 ENCOUNTER — Telehealth: Payer: Self-pay | Admitting: *Deleted

## 2019-11-25 DIAGNOSIS — Z03818 Encounter for observation for suspected exposure to other biological agents ruled out: Secondary | ICD-10-CM | POA: Diagnosis not present

## 2019-11-25 NOTE — Telephone Encounter (Addendum)
Christina Morgan from pt's assisted living facility called wanting clarification on how many mg of Eliquis pt should be on. She stated she received new orders from Dr. Delfina Redwood on 11/20/2019 stating pt was to be on Eliquis 2.5 mg BID.  [facility] received a refill request for pt's Eliquis on 11/30 and recommended she be on 5 mg of Eliquis BID.(See refill encounter from 11/11/2019).  Informed Christina Morgan that if Dr. Delfina Redwood is the physcian who is managing her care while she is at the Assisted living facility then she needs to clarify with him but can let him know that from a cardiology stand point we recommend that she be on Eliquis 5mg  BID.

## 2019-11-26 DIAGNOSIS — L89152 Pressure ulcer of sacral region, stage 2: Secondary | ICD-10-CM | POA: Diagnosis not present

## 2019-11-26 DIAGNOSIS — L89892 Pressure ulcer of other site, stage 2: Secondary | ICD-10-CM | POA: Diagnosis not present

## 2019-11-26 DIAGNOSIS — I10 Essential (primary) hypertension: Secondary | ICD-10-CM | POA: Diagnosis not present

## 2019-11-26 DIAGNOSIS — I5032 Chronic diastolic (congestive) heart failure: Secondary | ICD-10-CM | POA: Diagnosis not present

## 2019-11-26 DIAGNOSIS — Z79810 Long term (current) use of selective estrogen receptor modulators (SERMs): Secondary | ICD-10-CM | POA: Diagnosis not present

## 2019-11-26 DIAGNOSIS — C55 Malignant neoplasm of uterus, part unspecified: Secondary | ICD-10-CM | POA: Diagnosis not present

## 2019-11-28 ENCOUNTER — Encounter: Payer: Self-pay | Admitting: *Deleted

## 2019-11-28 DIAGNOSIS — Z79899 Other long term (current) drug therapy: Secondary | ICD-10-CM | POA: Diagnosis not present

## 2019-11-28 DIAGNOSIS — L89152 Pressure ulcer of sacral region, stage 2: Secondary | ICD-10-CM | POA: Diagnosis not present

## 2019-11-28 DIAGNOSIS — I10 Essential (primary) hypertension: Secondary | ICD-10-CM | POA: Diagnosis not present

## 2019-11-28 DIAGNOSIS — C55 Malignant neoplasm of uterus, part unspecified: Secondary | ICD-10-CM | POA: Diagnosis not present

## 2019-11-28 DIAGNOSIS — L89892 Pressure ulcer of other site, stage 2: Secondary | ICD-10-CM | POA: Diagnosis not present

## 2019-11-28 DIAGNOSIS — I5032 Chronic diastolic (congestive) heart failure: Secondary | ICD-10-CM | POA: Diagnosis not present

## 2019-11-28 DIAGNOSIS — Z79810 Long term (current) use of selective estrogen receptor modulators (SERMs): Secondary | ICD-10-CM | POA: Diagnosis not present

## 2019-11-28 NOTE — Telephone Encounter (Signed)
This encounter was created in error - please disregard.

## 2019-11-29 NOTE — Addendum Note (Signed)
Addended by: Johny Shock B on: 11/29/2019 10:42 AM   Modules accepted: Orders

## 2019-11-29 NOTE — Telephone Encounter (Signed)
Called Dr Lina Sar office and spoke to Dr. Lina Sar CMA on 12/15 to verify if he wanted her to be on 2.5 mg on Eliquis BID or the 5mg  of Eliquis BID so that our records would be up to date. She stated that she would talk to him and call back. Dr. Lina Sar CMA called back on 12/16 and left message for the Coumadin Clinic Staff that Dr Delfina Redwood was keeping the pt on Eliquis 2.5 mg BID. Will update pt's med list.

## 2019-12-02 DIAGNOSIS — Z79810 Long term (current) use of selective estrogen receptor modulators (SERMs): Secondary | ICD-10-CM | POA: Diagnosis not present

## 2019-12-02 DIAGNOSIS — L89892 Pressure ulcer of other site, stage 2: Secondary | ICD-10-CM | POA: Diagnosis not present

## 2019-12-02 DIAGNOSIS — C55 Malignant neoplasm of uterus, part unspecified: Secondary | ICD-10-CM | POA: Diagnosis not present

## 2019-12-02 DIAGNOSIS — L89152 Pressure ulcer of sacral region, stage 2: Secondary | ICD-10-CM | POA: Diagnosis not present

## 2019-12-02 DIAGNOSIS — I5032 Chronic diastolic (congestive) heart failure: Secondary | ICD-10-CM | POA: Diagnosis not present

## 2019-12-02 DIAGNOSIS — Z03818 Encounter for observation for suspected exposure to other biological agents ruled out: Secondary | ICD-10-CM | POA: Diagnosis not present

## 2019-12-02 DIAGNOSIS — I10 Essential (primary) hypertension: Secondary | ICD-10-CM | POA: Diagnosis not present

## 2019-12-10 DIAGNOSIS — Z79810 Long term (current) use of selective estrogen receptor modulators (SERMs): Secondary | ICD-10-CM | POA: Diagnosis not present

## 2019-12-10 DIAGNOSIS — I5032 Chronic diastolic (congestive) heart failure: Secondary | ICD-10-CM | POA: Diagnosis not present

## 2019-12-10 DIAGNOSIS — C55 Malignant neoplasm of uterus, part unspecified: Secondary | ICD-10-CM | POA: Diagnosis not present

## 2019-12-10 DIAGNOSIS — L89892 Pressure ulcer of other site, stage 2: Secondary | ICD-10-CM | POA: Diagnosis not present

## 2019-12-10 DIAGNOSIS — I10 Essential (primary) hypertension: Secondary | ICD-10-CM | POA: Diagnosis not present

## 2019-12-10 DIAGNOSIS — L89152 Pressure ulcer of sacral region, stage 2: Secondary | ICD-10-CM | POA: Diagnosis not present

## 2019-12-10 DIAGNOSIS — Z03818 Encounter for observation for suspected exposure to other biological agents ruled out: Secondary | ICD-10-CM | POA: Diagnosis not present

## 2019-12-11 DIAGNOSIS — I5032 Chronic diastolic (congestive) heart failure: Secondary | ICD-10-CM | POA: Diagnosis not present

## 2019-12-11 DIAGNOSIS — C55 Malignant neoplasm of uterus, part unspecified: Secondary | ICD-10-CM | POA: Diagnosis not present

## 2019-12-11 DIAGNOSIS — Z79810 Long term (current) use of selective estrogen receptor modulators (SERMs): Secondary | ICD-10-CM | POA: Diagnosis not present

## 2019-12-11 DIAGNOSIS — I10 Essential (primary) hypertension: Secondary | ICD-10-CM | POA: Diagnosis not present

## 2019-12-11 DIAGNOSIS — L89892 Pressure ulcer of other site, stage 2: Secondary | ICD-10-CM | POA: Diagnosis not present

## 2019-12-11 DIAGNOSIS — L89152 Pressure ulcer of sacral region, stage 2: Secondary | ICD-10-CM | POA: Diagnosis not present

## 2019-12-18 DIAGNOSIS — Z79810 Long term (current) use of selective estrogen receptor modulators (SERMs): Secondary | ICD-10-CM | POA: Diagnosis not present

## 2019-12-18 DIAGNOSIS — S81802A Unspecified open wound, left lower leg, initial encounter: Secondary | ICD-10-CM | POA: Diagnosis not present

## 2019-12-18 DIAGNOSIS — I10 Essential (primary) hypertension: Secondary | ICD-10-CM | POA: Diagnosis not present

## 2019-12-18 DIAGNOSIS — C55 Malignant neoplasm of uterus, part unspecified: Secondary | ICD-10-CM | POA: Diagnosis not present

## 2019-12-18 DIAGNOSIS — L89152 Pressure ulcer of sacral region, stage 2: Secondary | ICD-10-CM | POA: Diagnosis not present

## 2019-12-18 DIAGNOSIS — I5032 Chronic diastolic (congestive) heart failure: Secondary | ICD-10-CM | POA: Diagnosis not present

## 2019-12-18 DIAGNOSIS — L89892 Pressure ulcer of other site, stage 2: Secondary | ICD-10-CM | POA: Diagnosis not present

## 2019-12-18 DIAGNOSIS — I890 Lymphedema, not elsewhere classified: Secondary | ICD-10-CM | POA: Diagnosis not present

## 2019-12-20 DIAGNOSIS — Z23 Encounter for immunization: Secondary | ICD-10-CM | POA: Diagnosis not present

## 2019-12-23 DIAGNOSIS — I5032 Chronic diastolic (congestive) heart failure: Secondary | ICD-10-CM | POA: Diagnosis not present

## 2019-12-23 DIAGNOSIS — I10 Essential (primary) hypertension: Secondary | ICD-10-CM | POA: Diagnosis not present

## 2019-12-23 DIAGNOSIS — S72141A Displaced intertrochanteric fracture of right femur, initial encounter for closed fracture: Secondary | ICD-10-CM | POA: Diagnosis not present

## 2019-12-23 DIAGNOSIS — L89152 Pressure ulcer of sacral region, stage 2: Secondary | ICD-10-CM | POA: Diagnosis not present

## 2019-12-23 DIAGNOSIS — L89892 Pressure ulcer of other site, stage 2: Secondary | ICD-10-CM | POA: Diagnosis not present

## 2019-12-23 DIAGNOSIS — Z8781 Personal history of (healed) traumatic fracture: Secondary | ICD-10-CM | POA: Diagnosis not present

## 2019-12-23 DIAGNOSIS — R2689 Other abnormalities of gait and mobility: Secondary | ICD-10-CM | POA: Diagnosis not present

## 2019-12-23 DIAGNOSIS — G609 Hereditary and idiopathic neuropathy, unspecified: Secondary | ICD-10-CM | POA: Diagnosis not present

## 2019-12-23 DIAGNOSIS — I4891 Unspecified atrial fibrillation: Secondary | ICD-10-CM | POA: Diagnosis not present

## 2019-12-23 DIAGNOSIS — C55 Malignant neoplasm of uterus, part unspecified: Secondary | ICD-10-CM | POA: Diagnosis not present

## 2019-12-23 DIAGNOSIS — Z79810 Long term (current) use of selective estrogen receptor modulators (SERMs): Secondary | ICD-10-CM | POA: Diagnosis not present

## 2019-12-25 DIAGNOSIS — I5032 Chronic diastolic (congestive) heart failure: Secondary | ICD-10-CM | POA: Diagnosis not present

## 2019-12-25 DIAGNOSIS — S81802D Unspecified open wound, left lower leg, subsequent encounter: Secondary | ICD-10-CM | POA: Diagnosis not present

## 2019-12-25 DIAGNOSIS — I890 Lymphedema, not elsewhere classified: Secondary | ICD-10-CM | POA: Diagnosis not present

## 2019-12-25 DIAGNOSIS — L89152 Pressure ulcer of sacral region, stage 2: Secondary | ICD-10-CM | POA: Diagnosis not present

## 2019-12-25 DIAGNOSIS — I4891 Unspecified atrial fibrillation: Secondary | ICD-10-CM | POA: Diagnosis not present

## 2019-12-25 DIAGNOSIS — I10 Essential (primary) hypertension: Secondary | ICD-10-CM | POA: Diagnosis not present

## 2019-12-25 DIAGNOSIS — R2689 Other abnormalities of gait and mobility: Secondary | ICD-10-CM | POA: Diagnosis not present

## 2019-12-25 DIAGNOSIS — L89892 Pressure ulcer of other site, stage 2: Secondary | ICD-10-CM | POA: Diagnosis not present

## 2019-12-25 DIAGNOSIS — Z03818 Encounter for observation for suspected exposure to other biological agents ruled out: Secondary | ICD-10-CM | POA: Diagnosis not present

## 2019-12-27 DIAGNOSIS — R2689 Other abnormalities of gait and mobility: Secondary | ICD-10-CM | POA: Diagnosis not present

## 2019-12-27 DIAGNOSIS — L89892 Pressure ulcer of other site, stage 2: Secondary | ICD-10-CM | POA: Diagnosis not present

## 2019-12-27 DIAGNOSIS — L89152 Pressure ulcer of sacral region, stage 2: Secondary | ICD-10-CM | POA: Diagnosis not present

## 2019-12-27 DIAGNOSIS — I5032 Chronic diastolic (congestive) heart failure: Secondary | ICD-10-CM | POA: Diagnosis not present

## 2019-12-27 DIAGNOSIS — I4891 Unspecified atrial fibrillation: Secondary | ICD-10-CM | POA: Diagnosis not present

## 2019-12-27 DIAGNOSIS — I10 Essential (primary) hypertension: Secondary | ICD-10-CM | POA: Diagnosis not present

## 2019-12-30 DIAGNOSIS — L89892 Pressure ulcer of other site, stage 2: Secondary | ICD-10-CM | POA: Diagnosis not present

## 2019-12-30 DIAGNOSIS — R2689 Other abnormalities of gait and mobility: Secondary | ICD-10-CM | POA: Diagnosis not present

## 2019-12-30 DIAGNOSIS — I10 Essential (primary) hypertension: Secondary | ICD-10-CM | POA: Diagnosis not present

## 2019-12-30 DIAGNOSIS — Z03818 Encounter for observation for suspected exposure to other biological agents ruled out: Secondary | ICD-10-CM | POA: Diagnosis not present

## 2019-12-30 DIAGNOSIS — I5032 Chronic diastolic (congestive) heart failure: Secondary | ICD-10-CM | POA: Diagnosis not present

## 2019-12-30 DIAGNOSIS — L89152 Pressure ulcer of sacral region, stage 2: Secondary | ICD-10-CM | POA: Diagnosis not present

## 2019-12-30 DIAGNOSIS — I4891 Unspecified atrial fibrillation: Secondary | ICD-10-CM | POA: Diagnosis not present

## 2019-12-31 DIAGNOSIS — I10 Essential (primary) hypertension: Secondary | ICD-10-CM | POA: Diagnosis not present

## 2019-12-31 DIAGNOSIS — L89152 Pressure ulcer of sacral region, stage 2: Secondary | ICD-10-CM | POA: Diagnosis not present

## 2019-12-31 DIAGNOSIS — L89892 Pressure ulcer of other site, stage 2: Secondary | ICD-10-CM | POA: Diagnosis not present

## 2019-12-31 DIAGNOSIS — R2689 Other abnormalities of gait and mobility: Secondary | ICD-10-CM | POA: Diagnosis not present

## 2019-12-31 DIAGNOSIS — I5032 Chronic diastolic (congestive) heart failure: Secondary | ICD-10-CM | POA: Diagnosis not present

## 2019-12-31 DIAGNOSIS — I4891 Unspecified atrial fibrillation: Secondary | ICD-10-CM | POA: Diagnosis not present

## 2020-01-01 DIAGNOSIS — I5032 Chronic diastolic (congestive) heart failure: Secondary | ICD-10-CM | POA: Diagnosis not present

## 2020-01-01 DIAGNOSIS — R2689 Other abnormalities of gait and mobility: Secondary | ICD-10-CM | POA: Diagnosis not present

## 2020-01-01 DIAGNOSIS — Z03818 Encounter for observation for suspected exposure to other biological agents ruled out: Secondary | ICD-10-CM | POA: Diagnosis not present

## 2020-01-01 DIAGNOSIS — I4891 Unspecified atrial fibrillation: Secondary | ICD-10-CM | POA: Diagnosis not present

## 2020-01-01 DIAGNOSIS — L89152 Pressure ulcer of sacral region, stage 2: Secondary | ICD-10-CM | POA: Diagnosis not present

## 2020-01-01 DIAGNOSIS — I10 Essential (primary) hypertension: Secondary | ICD-10-CM | POA: Diagnosis not present

## 2020-01-01 DIAGNOSIS — L89892 Pressure ulcer of other site, stage 2: Secondary | ICD-10-CM | POA: Diagnosis not present

## 2020-01-02 DIAGNOSIS — L89892 Pressure ulcer of other site, stage 2: Secondary | ICD-10-CM | POA: Diagnosis not present

## 2020-01-02 DIAGNOSIS — I10 Essential (primary) hypertension: Secondary | ICD-10-CM | POA: Diagnosis not present

## 2020-01-02 DIAGNOSIS — L89152 Pressure ulcer of sacral region, stage 2: Secondary | ICD-10-CM | POA: Diagnosis not present

## 2020-01-02 DIAGNOSIS — I5032 Chronic diastolic (congestive) heart failure: Secondary | ICD-10-CM | POA: Diagnosis not present

## 2020-01-02 DIAGNOSIS — R2689 Other abnormalities of gait and mobility: Secondary | ICD-10-CM | POA: Diagnosis not present

## 2020-01-02 DIAGNOSIS — I4891 Unspecified atrial fibrillation: Secondary | ICD-10-CM | POA: Diagnosis not present

## 2020-01-06 DIAGNOSIS — I10 Essential (primary) hypertension: Secondary | ICD-10-CM | POA: Diagnosis not present

## 2020-01-06 DIAGNOSIS — R2689 Other abnormalities of gait and mobility: Secondary | ICD-10-CM | POA: Diagnosis not present

## 2020-01-06 DIAGNOSIS — I4891 Unspecified atrial fibrillation: Secondary | ICD-10-CM | POA: Diagnosis not present

## 2020-01-06 DIAGNOSIS — L89152 Pressure ulcer of sacral region, stage 2: Secondary | ICD-10-CM | POA: Diagnosis not present

## 2020-01-06 DIAGNOSIS — I5032 Chronic diastolic (congestive) heart failure: Secondary | ICD-10-CM | POA: Diagnosis not present

## 2020-01-06 DIAGNOSIS — Z03818 Encounter for observation for suspected exposure to other biological agents ruled out: Secondary | ICD-10-CM | POA: Diagnosis not present

## 2020-01-06 DIAGNOSIS — L89892 Pressure ulcer of other site, stage 2: Secondary | ICD-10-CM | POA: Diagnosis not present

## 2020-01-08 DIAGNOSIS — L89152 Pressure ulcer of sacral region, stage 2: Secondary | ICD-10-CM | POA: Diagnosis not present

## 2020-01-08 DIAGNOSIS — R2689 Other abnormalities of gait and mobility: Secondary | ICD-10-CM | POA: Diagnosis not present

## 2020-01-08 DIAGNOSIS — Z03818 Encounter for observation for suspected exposure to other biological agents ruled out: Secondary | ICD-10-CM | POA: Diagnosis not present

## 2020-01-08 DIAGNOSIS — I5032 Chronic diastolic (congestive) heart failure: Secondary | ICD-10-CM | POA: Diagnosis not present

## 2020-01-08 DIAGNOSIS — L89892 Pressure ulcer of other site, stage 2: Secondary | ICD-10-CM | POA: Diagnosis not present

## 2020-01-08 DIAGNOSIS — I10 Essential (primary) hypertension: Secondary | ICD-10-CM | POA: Diagnosis not present

## 2020-01-08 DIAGNOSIS — I4891 Unspecified atrial fibrillation: Secondary | ICD-10-CM | POA: Diagnosis not present

## 2020-01-10 DIAGNOSIS — L89892 Pressure ulcer of other site, stage 2: Secondary | ICD-10-CM | POA: Diagnosis not present

## 2020-01-10 DIAGNOSIS — I10 Essential (primary) hypertension: Secondary | ICD-10-CM | POA: Diagnosis not present

## 2020-01-10 DIAGNOSIS — I4891 Unspecified atrial fibrillation: Secondary | ICD-10-CM | POA: Diagnosis not present

## 2020-01-10 DIAGNOSIS — I5032 Chronic diastolic (congestive) heart failure: Secondary | ICD-10-CM | POA: Diagnosis not present

## 2020-01-10 DIAGNOSIS — R2689 Other abnormalities of gait and mobility: Secondary | ICD-10-CM | POA: Diagnosis not present

## 2020-01-10 DIAGNOSIS — L89152 Pressure ulcer of sacral region, stage 2: Secondary | ICD-10-CM | POA: Diagnosis not present

## 2020-01-13 DIAGNOSIS — I10 Essential (primary) hypertension: Secondary | ICD-10-CM | POA: Diagnosis not present

## 2020-01-13 DIAGNOSIS — R2689 Other abnormalities of gait and mobility: Secondary | ICD-10-CM | POA: Diagnosis not present

## 2020-01-13 DIAGNOSIS — L89152 Pressure ulcer of sacral region, stage 2: Secondary | ICD-10-CM | POA: Diagnosis not present

## 2020-01-13 DIAGNOSIS — Z03818 Encounter for observation for suspected exposure to other biological agents ruled out: Secondary | ICD-10-CM | POA: Diagnosis not present

## 2020-01-13 DIAGNOSIS — I5032 Chronic diastolic (congestive) heart failure: Secondary | ICD-10-CM | POA: Diagnosis not present

## 2020-01-13 DIAGNOSIS — L89892 Pressure ulcer of other site, stage 2: Secondary | ICD-10-CM | POA: Diagnosis not present

## 2020-01-13 DIAGNOSIS — I4891 Unspecified atrial fibrillation: Secondary | ICD-10-CM | POA: Diagnosis not present

## 2020-01-16 DIAGNOSIS — L89892 Pressure ulcer of other site, stage 2: Secondary | ICD-10-CM | POA: Diagnosis not present

## 2020-01-16 DIAGNOSIS — I4891 Unspecified atrial fibrillation: Secondary | ICD-10-CM | POA: Diagnosis not present

## 2020-01-16 DIAGNOSIS — L89152 Pressure ulcer of sacral region, stage 2: Secondary | ICD-10-CM | POA: Diagnosis not present

## 2020-01-16 DIAGNOSIS — R2689 Other abnormalities of gait and mobility: Secondary | ICD-10-CM | POA: Diagnosis not present

## 2020-01-16 DIAGNOSIS — I5032 Chronic diastolic (congestive) heart failure: Secondary | ICD-10-CM | POA: Diagnosis not present

## 2020-01-16 DIAGNOSIS — I10 Essential (primary) hypertension: Secondary | ICD-10-CM | POA: Diagnosis not present

## 2020-01-17 DIAGNOSIS — I5032 Chronic diastolic (congestive) heart failure: Secondary | ICD-10-CM | POA: Diagnosis not present

## 2020-01-17 DIAGNOSIS — I4891 Unspecified atrial fibrillation: Secondary | ICD-10-CM | POA: Diagnosis not present

## 2020-01-17 DIAGNOSIS — R2689 Other abnormalities of gait and mobility: Secondary | ICD-10-CM | POA: Diagnosis not present

## 2020-01-17 DIAGNOSIS — L89892 Pressure ulcer of other site, stage 2: Secondary | ICD-10-CM | POA: Diagnosis not present

## 2020-01-17 DIAGNOSIS — I10 Essential (primary) hypertension: Secondary | ICD-10-CM | POA: Diagnosis not present

## 2020-01-17 DIAGNOSIS — Z23 Encounter for immunization: Secondary | ICD-10-CM | POA: Diagnosis not present

## 2020-01-17 DIAGNOSIS — L89152 Pressure ulcer of sacral region, stage 2: Secondary | ICD-10-CM | POA: Diagnosis not present

## 2020-01-22 DIAGNOSIS — C55 Malignant neoplasm of uterus, part unspecified: Secondary | ICD-10-CM | POA: Diagnosis not present

## 2020-01-22 DIAGNOSIS — L89892 Pressure ulcer of other site, stage 2: Secondary | ICD-10-CM | POA: Diagnosis not present

## 2020-01-22 DIAGNOSIS — Z8781 Personal history of (healed) traumatic fracture: Secondary | ICD-10-CM | POA: Diagnosis not present

## 2020-01-22 DIAGNOSIS — S72141A Displaced intertrochanteric fracture of right femur, initial encounter for closed fracture: Secondary | ICD-10-CM | POA: Diagnosis not present

## 2020-01-22 DIAGNOSIS — I4891 Unspecified atrial fibrillation: Secondary | ICD-10-CM | POA: Diagnosis not present

## 2020-01-22 DIAGNOSIS — Z79810 Long term (current) use of selective estrogen receptor modulators (SERMs): Secondary | ICD-10-CM | POA: Diagnosis not present

## 2020-01-22 DIAGNOSIS — R2689 Other abnormalities of gait and mobility: Secondary | ICD-10-CM | POA: Diagnosis not present

## 2020-01-22 DIAGNOSIS — G609 Hereditary and idiopathic neuropathy, unspecified: Secondary | ICD-10-CM | POA: Diagnosis not present

## 2020-01-22 DIAGNOSIS — L89152 Pressure ulcer of sacral region, stage 2: Secondary | ICD-10-CM | POA: Diagnosis not present

## 2020-01-22 DIAGNOSIS — I5032 Chronic diastolic (congestive) heart failure: Secondary | ICD-10-CM | POA: Diagnosis not present

## 2020-01-22 DIAGNOSIS — I10 Essential (primary) hypertension: Secondary | ICD-10-CM | POA: Diagnosis not present

## 2020-01-23 DIAGNOSIS — L89152 Pressure ulcer of sacral region, stage 2: Secondary | ICD-10-CM | POA: Diagnosis not present

## 2020-01-23 DIAGNOSIS — I10 Essential (primary) hypertension: Secondary | ICD-10-CM | POA: Diagnosis not present

## 2020-01-23 DIAGNOSIS — L89892 Pressure ulcer of other site, stage 2: Secondary | ICD-10-CM | POA: Diagnosis not present

## 2020-01-23 DIAGNOSIS — R2689 Other abnormalities of gait and mobility: Secondary | ICD-10-CM | POA: Diagnosis not present

## 2020-01-23 DIAGNOSIS — I4891 Unspecified atrial fibrillation: Secondary | ICD-10-CM | POA: Diagnosis not present

## 2020-01-23 DIAGNOSIS — I5032 Chronic diastolic (congestive) heart failure: Secondary | ICD-10-CM | POA: Diagnosis not present

## 2020-01-24 ENCOUNTER — Ambulatory Visit: Payer: Medicare Other | Admitting: Cardiology

## 2020-01-24 ENCOUNTER — Other Ambulatory Visit (HOSPITAL_COMMUNITY): Payer: Medicare Other

## 2020-01-27 ENCOUNTER — Ambulatory Visit: Payer: Medicare Other | Admitting: Cardiology

## 2020-01-27 DIAGNOSIS — Z03818 Encounter for observation for suspected exposure to other biological agents ruled out: Secondary | ICD-10-CM | POA: Diagnosis not present

## 2020-01-28 DIAGNOSIS — L89892 Pressure ulcer of other site, stage 2: Secondary | ICD-10-CM | POA: Diagnosis not present

## 2020-01-28 DIAGNOSIS — I5032 Chronic diastolic (congestive) heart failure: Secondary | ICD-10-CM | POA: Diagnosis not present

## 2020-01-28 DIAGNOSIS — R2689 Other abnormalities of gait and mobility: Secondary | ICD-10-CM | POA: Diagnosis not present

## 2020-01-28 DIAGNOSIS — I10 Essential (primary) hypertension: Secondary | ICD-10-CM | POA: Diagnosis not present

## 2020-01-28 DIAGNOSIS — I4891 Unspecified atrial fibrillation: Secondary | ICD-10-CM | POA: Diagnosis not present

## 2020-01-28 DIAGNOSIS — L89152 Pressure ulcer of sacral region, stage 2: Secondary | ICD-10-CM | POA: Diagnosis not present

## 2020-01-29 ENCOUNTER — Emergency Department (HOSPITAL_COMMUNITY)
Admission: EM | Admit: 2020-01-29 | Discharge: 2020-01-30 | Disposition: A | Discharge status: Home / Self Care | Payer: Medicare Other | Attending: Emergency Medicine | Admitting: Emergency Medicine

## 2020-01-29 ENCOUNTER — Other Ambulatory Visit: Payer: Self-pay

## 2020-01-29 ENCOUNTER — Encounter (HOSPITAL_COMMUNITY): Payer: Self-pay | Admitting: Student

## 2020-01-29 ENCOUNTER — Emergency Department (HOSPITAL_COMMUNITY): Payer: Medicare Other

## 2020-01-29 DIAGNOSIS — S0993XA Unspecified injury of face, initial encounter: Secondary | ICD-10-CM | POA: Diagnosis not present

## 2020-01-29 DIAGNOSIS — W1830XA Fall on same level, unspecified, initial encounter: Secondary | ICD-10-CM | POA: Insufficient documentation

## 2020-01-29 DIAGNOSIS — S12101A Unspecified nondisplaced fracture of second cervical vertebra, initial encounter for closed fracture: Secondary | ICD-10-CM | POA: Diagnosis not present

## 2020-01-29 DIAGNOSIS — S0232XA Fracture of orbital floor, left side, initial encounter for closed fracture: Secondary | ICD-10-CM | POA: Diagnosis not present

## 2020-01-29 DIAGNOSIS — S0181XA Laceration without foreign body of other part of head, initial encounter: Secondary | ICD-10-CM | POA: Diagnosis not present

## 2020-01-29 DIAGNOSIS — Z23 Encounter for immunization: Secondary | ICD-10-CM | POA: Insufficient documentation

## 2020-01-29 DIAGNOSIS — S0590XA Unspecified injury of unspecified eye and orbit, initial encounter: Secondary | ICD-10-CM | POA: Diagnosis not present

## 2020-01-29 DIAGNOSIS — Y939 Activity, unspecified: Secondary | ICD-10-CM | POA: Insufficient documentation

## 2020-01-29 DIAGNOSIS — W19XXXA Unspecified fall, initial encounter: Secondary | ICD-10-CM | POA: Diagnosis not present

## 2020-01-29 DIAGNOSIS — Y92129 Unspecified place in nursing home as the place of occurrence of the external cause: Secondary | ICD-10-CM | POA: Diagnosis not present

## 2020-01-29 DIAGNOSIS — R9431 Abnormal electrocardiogram [ECG] [EKG]: Secondary | ICD-10-CM | POA: Diagnosis not present

## 2020-01-29 DIAGNOSIS — S01112A Laceration without foreign body of left eyelid and periocular area, initial encounter: Secondary | ICD-10-CM | POA: Diagnosis not present

## 2020-01-29 DIAGNOSIS — Z9189 Other specified personal risk factors, not elsewhere classified: Secondary | ICD-10-CM | POA: Diagnosis not present

## 2020-01-29 DIAGNOSIS — Y999 Unspecified external cause status: Secondary | ICD-10-CM | POA: Diagnosis not present

## 2020-01-29 DIAGNOSIS — I10 Essential (primary) hypertension: Secondary | ICD-10-CM | POA: Diagnosis not present

## 2020-01-29 DIAGNOSIS — S12100A Unspecified displaced fracture of second cervical vertebra, initial encounter for closed fracture: Secondary | ICD-10-CM | POA: Diagnosis not present

## 2020-01-29 DIAGNOSIS — S0990XA Unspecified injury of head, initial encounter: Secondary | ICD-10-CM | POA: Diagnosis not present

## 2020-01-29 DIAGNOSIS — S022XXA Fracture of nasal bones, initial encounter for closed fracture: Secondary | ICD-10-CM | POA: Diagnosis not present

## 2020-01-29 DIAGNOSIS — I890 Lymphedema, not elsewhere classified: Secondary | ICD-10-CM | POA: Diagnosis not present

## 2020-01-29 DIAGNOSIS — S0592XA Unspecified injury of left eye and orbit, initial encounter: Secondary | ICD-10-CM | POA: Diagnosis not present

## 2020-01-29 DIAGNOSIS — T07XXXA Unspecified multiple injuries, initial encounter: Secondary | ICD-10-CM | POA: Diagnosis present

## 2020-01-29 DIAGNOSIS — S12121A Other nondisplaced dens fracture, initial encounter for closed fracture: Secondary | ICD-10-CM | POA: Diagnosis not present

## 2020-01-29 DIAGNOSIS — R609 Edema, unspecified: Secondary | ICD-10-CM | POA: Diagnosis not present

## 2020-01-29 DIAGNOSIS — R58 Hemorrhage, not elsewhere classified: Secondary | ICD-10-CM | POA: Diagnosis not present

## 2020-01-29 LAB — CBC WITH DIFFERENTIAL/PLATELET
Abs Immature Granulocytes: 0.07 10*3/uL (ref 0.00–0.07)
Basophils Absolute: 0 10*3/uL (ref 0.0–0.1)
Basophils Relative: 0 %
Eosinophils Absolute: 0.1 10*3/uL (ref 0.0–0.5)
Eosinophils Relative: 1 %
HCT: 35.7 % — ABNORMAL LOW (ref 36.0–46.0)
Hemoglobin: 11.3 g/dL — ABNORMAL LOW (ref 12.0–15.0)
Immature Granulocytes: 1 %
Lymphocytes Relative: 18 %
Lymphs Abs: 1.1 10*3/uL (ref 0.7–4.0)
MCH: 32.1 pg (ref 26.0–34.0)
MCHC: 31.7 g/dL (ref 30.0–36.0)
MCV: 101.4 fL — ABNORMAL HIGH (ref 80.0–100.0)
Monocytes Absolute: 0.5 10*3/uL (ref 0.1–1.0)
Monocytes Relative: 9 %
Neutro Abs: 4.1 10*3/uL (ref 1.7–7.7)
Neutrophils Relative %: 71 %
Platelets: 213 10*3/uL (ref 150–400)
RBC: 3.52 MIL/uL — ABNORMAL LOW (ref 3.87–5.11)
RDW: 14.2 % (ref 11.5–15.5)
WBC: 5.9 10*3/uL (ref 4.0–10.5)
nRBC: 0 % (ref 0.0–0.2)

## 2020-01-29 LAB — BASIC METABOLIC PANEL
Anion gap: 9 (ref 5–15)
BUN: 33 mg/dL — ABNORMAL HIGH (ref 8–23)
CO2: 22 mmol/L (ref 22–32)
Calcium: 8.8 mg/dL — ABNORMAL LOW (ref 8.9–10.3)
Chloride: 110 mmol/L (ref 98–111)
Creatinine, Ser: 1.39 mg/dL — ABNORMAL HIGH (ref 0.44–1.00)
GFR calc Af Amer: 40 mL/min — ABNORMAL LOW (ref >60)
GFR calc non Af Amer: 35 mL/min — ABNORMAL LOW (ref >60)
Glucose, Bld: 119 mg/dL — ABNORMAL HIGH (ref 70–99)
Potassium: 4.4 mmol/L (ref 3.5–5.1)
Sodium: 141 mmol/L (ref 135–145)

## 2020-01-29 MED ORDER — TETANUS-DIPHTH-ACELL PERTUSSIS 5-2.5-18.5 LF-MCG/0.5 IM SUSP
0.5000 mL | Freq: Once | INTRAMUSCULAR | Status: AC
  Administered 2020-01-29: 0.5 mL via INTRAMUSCULAR
  Filled 2020-01-29: qty 0.5

## 2020-01-29 NOTE — ED Provider Notes (Signed)
[place] EMERGENCY DEPARTMENT Provider Note   CSN: [id number] Arrival date & time: 01/29/20  2000     History Chief Complaint  Patient presents with  . Fall    Christina Morgan is a 86 y.o. female.  She is brought in by EMS after a fall at her independent living at [facility].  She tells me she was bending over to get a piece of candy and fell striking her head.  On anticoagulation.  She is complaining of some facial pain and some left elbow pain.  No blurry vision double vision.  Unknown last tetanus shot.  The history is provided by the patient and the EMS personnel.  Fall This is a new problem. The current episode started less than 1 hour ago. The problem has not changed since onset.Pertinent negatives include no chest pain, no abdominal pain, no headaches and no shortness of breath. Nothing aggravates the symptoms. Nothing relieves the symptoms. She has tried nothing for the symptoms. The treatment provided no relief.       No past medical history on file.  There are no problems to display for this patient.   Past Surgical History:  Procedure Laterality Date  . CARDIOVERSION N/A 06/05/2014   Procedure: CARDIOVERSION;  Surgeon: Sueanne Margarita, MD;  Location: [place];  Service: Cardiovascular;  Laterality: N/A;  . CARDIOVERSION N/A 07/11/2014   Procedure: CARDIOVERSION;  Surgeon: Sueanne Margarita, MD;  Location: [facility] ENDOSCOPY;  Service: Cardiovascular;  Laterality: N/A;  . CARDIOVERSION N/A 09/04/2014   Procedure: CARDIOVERSION;  Surgeon: Sueanne Margarita, MD;  Location: [facility] ENDOSCOPY;  Service: Cardiovascular;  Laterality: N/A;  . COLONOSCOPY  2008  . COLONOSCOPY N/A 05/20/2015   Procedure: COLONOSCOPY;  Surgeon: Ronald Lobo, MD;  Location: [facility] ENDOSCOPY;  Service: Endoscopy;  Laterality: N/A;  . FEMUR IM NAIL Right 09/24/2019   Procedure: INTRAMEDULLARY (IM) NAIL FEMORAL;  Surgeon: Altamese Cleary, MD;  Location: [place];  Service: Orthopedics;   Laterality: Right;  . IR KYPHO EA ADDL LEVEL THORACIC OR LUMBAR  04/12/2019  . IR KYPHO THORACIC WITH BONE BIOPSY  04/12/2019  . ROBOTIC ASSISTED SUPRACERVICAL HYSTERECTOMY WITH BILATERAL SALPINGO OOPHERECTOMY  11/06/2012   and bilateral pelvic lymph node dissection     OB History   No obstetric history on file.     No family history on file.  Social History   Tobacco Use  . Smoking status: Never Smoker  . Smokeless tobacco: Never Used  Substance Use Topics  . Alcohol use: No  . Drug use: No    Home Medications Prior to Admission medications   Not on File    Allergies    Patient has no allergy information on record.  Review of Systems   Review of Systems  Constitutional: Negative for fever.  HENT: Negative for sore throat.   Eyes: Negative for visual disturbance.  Respiratory: Negative for shortness of breath.   Cardiovascular: Positive for leg swelling (r leg chronic since hip surgery). Negative for chest pain.  Gastrointestinal: Negative for abdominal pain.  Genitourinary: Negative for dysuria.  Musculoskeletal: Negative for neck pain.  Skin: Negative for rash.  Neurological: Negative for headaches.    Physical Exam Updated Vital Signs BP (!) 150/62 (BP Location: Right Arm)   Pulse 72   Temp (!) 97.5 F (36.4 C) (Oral)   Resp 18   SpO2 100%   Physical Exam Vitals and nursing note reviewed.  Constitutional:      General: She is  not in acute distress.    Appearance: She is well-developed.  HENT:     Head: Normocephalic.     Comments: Small laceration over left eyebrow.  Moderate ecchymosis around left eye.  Extraocular movements intact.  Pupils equal round reactive to light. Eyes:     Conjunctiva/sclera: Conjunctivae normal.  Neck:     Comments: No midline cervical spine tenderness.  C-collar in place trach midline. Cardiovascular:     Rate and Rhythm: Normal rate and regular rhythm.     Heart sounds: No murmur.  Pulmonary:     Effort: Pulmonary  effort is normal. No respiratory distress.     Breath sounds: Normal breath sounds.  Abdominal:     Palpations: Abdomen is soft.     Tenderness: There is no abdominal tenderness. There is no guarding or rebound.     Hernia: A hernia (ventral) is present.  Musculoskeletal:        General: Tenderness present.     Right lower leg: Edema present.     Comments: She is some limitation in range of motion of her right hip although she said this is chronic since her hip fracture.  Chronic edema in that right leg.  No knee or ankle tenderness.  No left lower extremity tenderness.  No right upper extremity tenderness.  Minimal left upper extremity tenderness around her forearm where she has some skin tears.  No bony tenderness.  Distal neurovascular intact.  Skin:    General: Skin is warm and dry.     Capillary Refill: Capillary refill takes less than 2 seconds.  Neurological:     General: No focal deficit present.     Mental Status: She is alert and oriented to person, place, and time.     ED Results / Procedures / Treatments   Labs (all labs ordered are listed, but only abnormal results are displayed) Labs Reviewed  BASIC METABOLIC PANEL - Abnormal; Notable for the following components:      Result Value   Glucose, Bld 119 (*)    BUN 33 (*)    Creatinine, Ser 1.39 (*)    Calcium 8.8 (*)    GFR calc non Af Amer 35 (*)    GFR calc Af Amer 40 (*)    All other components within normal limits  CBC WITH DIFFERENTIAL/PLATELET - Abnormal; Notable for the following components:   RBC 3.52 (*)    Hemoglobin 11.3 (*)    HCT 35.7 (*)    MCV 101.4 (*)    All other components within normal limits    EKG EKG Interpretation  Date/Time:  Wednesday January 29 2020 20:12:32 EST Ventricular Rate:  72 PR Interval:    QRS Duration: 113 QT Interval:  430 QTC Calculation: 471 R Axis:   78 Text Interpretation: Sinus rhythm Prolonged PR interval Borderline intraventricular conduction delay No  significant change since prior today Confirmed by Aletta Edouard [phone]) on 01/29/2020 8:14:07 PM   Radiology CT Head Wo Contrast  Result Date: 01/29/2020 CLINICAL DATA:  86 year old female status post fall striking head. On Eliquis. EXAM: CT HEAD WITHOUT CONTRAST TECHNIQUE: Contiguous axial images were obtained from the base of the skull through the vertex without intravenous contrast. COMPARISON:  Head CT 09/23/2019. FINDINGS: Brain: Chronic calcified left lateral convexity meningioma appears stable measuring 28 mm diameter. Stable associated mild mass effect at the operculum. No other intracranial mass or mass effect identified. No ventriculomegaly. No acute intracranial hemorrhage identified. Stable gray-white matter differentiation throughout the  brain. No cortically based acute infarct identified. Vascular: Calcified atherosclerosis at the skull base. No suspicious intracranial vascular hyperdensity. Skull: No calvarium fracture identified. Facial bones reported separately. Sinuses/Orbits: Hemorrhage layering in the left maxillary sinus. See face CT reported separately. Other: Left periorbital and face hematoma or contusion, tracking toward the left forehead. See face CT reported separately. IMPRESSION: 1. Left face soft tissue injury with underlying fractures, reported separately. 2. No acute traumatic injury to the brain identified. Stable chronic left operculum meningioma. Electronically Signed   By: Genevie Ann M.D.   On: 01/29/2020 21:35   CT Cervical Spine Wo Contrast  Result Date: 01/29/2020 CLINICAL DATA:  86 year old female status post fall striking head. On Eliquis. EXAM: CT CERVICAL SPINE WITHOUT CONTRAST TECHNIQUE: Multidetector CT imaging of the cervical spine was performed without intravenous contrast. Multiplanar CT image reconstructions were also generated. COMPARISON:  Cervical spine CT 09/23/2019. Face CT today reported separately. Thoracic spine CT 04/24/2019. FINDINGS: Alignment:  Mildly increase straightening of cervical lordosis compared to last year. Cervicothoracic junction alignment is within normal limits. Posterior element alignment maintained. Skull base and vertebrae: Pronounced osteopenia. Visualized skull base is intact. No atlanto-occipital dissociation. The C1 vertebra appears intact but there is a combined type 2 odontoid fracture (coronal image 15) with a comminuted somewhat vertical fracture through the left articular pillar of C2 (coronal image 18). These are nondisplaced. The C2 pedicles and posterior elements appear to remain intact. The remaining cervical vertebrae appear intact, osteopenia limits detail of the C7 body as before. There is mild chronic degenerative appearing anterolisthesis from C3-C4 through C5-C6. Soft tissues and spinal canal: No prevertebral fluid or swelling. No visible canal hematoma. Stable ligamentous hypertrophy about the odontoid. Partially retropharyngeal course of the common carotid arteries. Otherwise negative noncontrast neck soft tissues. Disc levels: Stable facet more so than disc related degenerative changes. No cervical spinal stenosis suspected. Upper chest: Osteopenia. Stable mild T3 superior endplate compression since 04/24/2019. Calcified aortic atherosclerosis. Lung apices are clear. IMPRESSION: 1. Positive for acute C2 fracture with a combined type 2 fracture of the odontoid and a somewhat vertical fracture of the left C2 body. These are nondisplaced. 2. No other acute traumatic injury identified in the cervical spine. 3. Osteopenia.  Chronic T3 compression fracture. Study discussed by telephone with Dr. Aletta Edouard on 01/29/2020 at 21:47 . Electronically Signed   By: Genevie Ann M.D.   On: 01/29/2020 21:47   CT Maxillofacial WO CM  Result Date: 01/29/2020 CLINICAL DATA:  85 year old female status post fall striking head. On Eliquis. EXAM: CT MAXILLOFACIAL WITHOUT CONTRAST TECHNIQUE: Multidetector CT imaging of the maxillofacial  structures was performed. Multiplanar CT image reconstructions were also generated. COMPARISON:  Head CT today reported separately. FINDINGS: Osseous: Osteopenia. Mandible intact. No zygoma fracture. Mildly displaced left nasal bone fracture on series 5, image 52. The anterior and posterior walls of the left maxillary sinus appear intact. No right maxilla fracture. Central skull base appears intact. There is a C2 cervical spine fracture. See the dedicated cervical spine CT today reported separately. Orbits: There is a left orbital floor fracture with mild herniation of fat and marginal herniation of the inferior rectus muscle. The remaining left orbital walls are intact. Contralateral right orbital walls are intact. Small volume intraorbital gas. No intraorbital hematoma. The left globe is intact. Superficial left periorbital hematoma or contusion. Sinuses: Hemorrhage layering in the left maxillary sinus. Other paranasal sinuses are well pneumatized. Tympanic cavities and mastoids are clear. Soft tissues: Superficial left face  and periorbital injury stated above. Negative visible noncontrast larynx, pharynx, parapharyngeal spaces, retropharyngeal space, sublingual space, submandibular, masticator and parotid spaces. Limited intracranial: Stable from the head CT reported separately today. IMPRESSION: 1. Left orbital floor fracture with mild herniation of fat and the inferior rectus muscle. Superficial periorbital hematoma/contusion but no intraorbital hematoma. 2. Mildly displaced left nasal bone fracture. 3. No other acute facial fracture identified. 4. C2 vertebral fracture - see cervical spine CT today reported separately. Electronically Signed   By: Genevie Ann M.D.   On: 01/29/2020 21:40    Procedures .Marland KitchenLaceration Repair  Date/Time: 01/30/2020 11:37 AM Performed by: Hayden Rasmussen, MD Authorized by: Hayden Rasmussen, MD   Consent:    Consent obtained:  Verbal   Consent given by:  Patient   Risks  discussed:  Infection, pain, poor cosmetic result, poor wound healing and retained foreign body   Alternatives discussed:  No treatment and delayed treatment Laceration details:    Location:  Face   Face location:  L eyebrow   Length (cm):  3 Repair type:    Repair type:  Simple Treatment:    Area cleansed with:  Saline   Amount of cleaning:  Standard Skin repair:    Repair method:  Tissue adhesive Approximation:    Approximation:  Close Post-procedure details:    Dressing:  Open (no dressing)   Patient tolerance of procedure:  Tolerated well, no immediate complications   (including critical care time)  Medications Ordered in ED Medications  Tdap (BOOSTRIX) injection 0.5 mL (0.5 mLs Intramuscular Given 01/29/20 2050)    ED Course  I have reviewed the triage vital signs and the nursing notes.  Pertinent labs & imaging results that were available during my care of the patient were reviewed by me and considered in my medical decision making (see chart for details).  Clinical Course as of Jan 29 1135  Wed Jan 28, 8127  6735 86 year old female on anticoagulation here after mechanical fall.  Bruising swelling and a laceration on the face.  Getting a CT head and cervical spine along with max face.   [MB]  2036 Differential includes head bleed, skull fracture, facial fractures, contusions   [MB]  2223 Discussed with radiology called regarding her CTs.  She has no intracranial findings but does have an orbital floor fracture.  There is also a C2 body fracture possibly 2.  Have paged neurosurgery and ENT for recommendations.   [MB]  2232 Discussed with Dr. Wilburn Cornelia from ENT.  He said these are usually treated as an outpatient.  Recommend standard treatment of saline nasal spray no antibiotics elevated head of bed ice.  Strict instructions to not blow nose and to try to sneeze with open mouth.  Recommends follow-up in the office in 4 to 5 days to let the swelling go down and he said he  would have her continue her anticoagulation unless there was active bleeding.   [MB]  2313 Neurosurgery evaluated patient in the department and felt that Aspen collar and follow-up in the office in 2 weeks with Dr. Ronnald Ramp.   [MB]  2317 Discussed with Margo Aye covering Dr. Ronnald Ramp.  Their recommendation is hard collar and follow-up in the office in 2 weeks.  I placed a call to the patient's daughter and reviewed the findings and plan.   [MB]    Clinical Course User Index [MB] Hayden Rasmussen, MD   MDM Rules/Calculators/A&P  Final Clinical Impression(s) / ED Diagnoses Final diagnoses:  Fall, initial encounter  Closed nondisplaced fracture of second cervical vertebra, unspecified fracture morphology, initial encounter ([place])  Closed fracture of left orbital floor, initial encounter [facility])  Facial laceration, initial encounter    Rx / DC Orders ED Discharge Orders    None       Hayden Rasmussen, MD 01/30/20 1139

## 2020-01-29 NOTE — Consult Note (Signed)
Reason for Consult:c2 fracture Referring Physician: edp  Christina Morgan is an 86 y.o. female.   HPI:  86 year old female presented to the ED tonight after sustaining a fall when she bent over to pick a piece of candy up. She lives at an assisted living facility. She denies any neck pain but states the collar hurts. Denies any radicular pain, NT or weakness down her arms. She is on eliquis.   History reviewed. No pertinent past medical history.  History reviewed. No pertinent surgical history.  Not on File  Social History   Tobacco Use  . Smoking status: Not on file  Substance Use Topics  . Alcohol use: Not on file    History reviewed. No pertinent family history.   Review of Systems  Positive ROS: as above  All other systems have been reviewed and were otherwise negative with the exception of those mentioned in the HPI and as above.  Objective: Vital signs in last 24 hours: Temp:  [97.5 F (36.4 C)] 97.5 F (36.4 C) (02/17 2007) Pulse Rate:  [72-74] 74 (02/17 2145) Resp:  [18-37] 22 (02/17 2215) BP: (145-167)/(59-64) 167/64 (02/17 2215) SpO2:  [99 %-100 %] 100 % (02/17 2145) Weight:  [77.6 kg] 77.6 kg (02/17 2014)  General Appearance: Alert, cooperative, no distress, appears stated age Head: Normocephalic, without obvious abnormality, atraumatic Eyes: PERRL, conjunctiva/corneas clear, EOM's intact, fundi benign, both eyes      Lungs: respirations unlabored Heart: Regular rate and rhythm Skin: Skin color, texture, turgor normal, no rashes or lesions, scattered bruising  NEUROLOGIC:   Mental status: A&O x4, no aphasia, good attention span, Memory and fund of knowledge Motor Exam - grossly normal, normal tone and bulk Sensory Exam - grossly normal Reflexes: not tested Coordination - grossly normal Gait - not tested Balance - not tested Cranial Nerves: I: smell Not tested  II: visual acuity  OS: na    OD: na  II: visual fields Full to confrontation  II:  pupils Equal, round, reactive to light  III,VII: ptosis   III,IV,VI: extraocular muscles    V: mastication   V: facial light touch sensation    V,VII: corneal reflex    VII: facial muscle function - upper    VII: facial muscle function - lower   VIII: hearing   IX: soft palate elevation    IX,X: gag reflex   XI: trapezius strength    XI: sternocleidomastoid strength   XI: neck flexion strength    XII: tongue strength      Data Review Lab Results  Component Value Date   WBC 5.9 01/29/2020   HGB 11.3 (L) 01/29/2020   HCT 35.7 (L) 01/29/2020   MCV 101.4 (H) 01/29/2020   PLT 213 01/29/2020   Lab Results  Component Value Date   NA 141 01/29/2020   K 4.4 01/29/2020   CL 110 01/29/2020   CO2 22 01/29/2020   BUN 33 (H) 01/29/2020   CREATININE 1.39 (H) 01/29/2020   GLUCOSE 119 (H) 01/29/2020   No results found for: INR, PROTIME  Radiology: CT Head Wo Contrast  Result Date: 01/29/2020 CLINICAL DATA:  86 year old female status post fall striking head. On Eliquis. EXAM: CT HEAD WITHOUT CONTRAST TECHNIQUE: Contiguous axial images were obtained from the base of the skull through the vertex without intravenous contrast. COMPARISON:  Head CT 09/23/2019. FINDINGS: Brain: Chronic calcified left lateral convexity meningioma appears stable measuring 28 mm diameter. Stable associated mild mass effect at the operculum. No  other intracranial mass or mass effect identified. No ventriculomegaly. No acute intracranial hemorrhage identified. Stable gray-white matter differentiation throughout the brain. No cortically based acute infarct identified. Vascular: Calcified atherosclerosis at the skull base. No suspicious intracranial vascular hyperdensity. Skull: No calvarium fracture identified. Facial bones reported separately. Sinuses/Orbits: Hemorrhage layering in the left maxillary sinus. See face CT reported separately. Other: Left periorbital and face hematoma or contusion, tracking toward the left  forehead. See face CT reported separately. IMPRESSION: 1. Left face soft tissue injury with underlying fractures, reported separately. 2. No acute traumatic injury to the brain identified. Stable chronic left operculum meningioma. Electronically Signed   By: Genevie Ann M.D.   On: 01/29/2020 21:35   CT Cervical Spine Wo Contrast  Result Date: 01/29/2020 CLINICAL DATA:  86 year old female status post fall striking head. On Eliquis. EXAM: CT CERVICAL SPINE WITHOUT CONTRAST TECHNIQUE: Multidetector CT imaging of the cervical spine was performed without intravenous contrast. Multiplanar CT image reconstructions were also generated. COMPARISON:  Cervical spine CT 09/23/2019. Face CT today reported separately. Thoracic spine CT 04/24/2019. FINDINGS: Alignment: Mildly increase straightening of cervical lordosis compared to last year. Cervicothoracic junction alignment is within normal limits. Posterior element alignment maintained. Skull base and vertebrae: Pronounced osteopenia. Visualized skull base is intact. No atlanto-occipital dissociation. The C1 vertebra appears intact but there is a combined type 2 odontoid fracture (coronal image 15) with a comminuted somewhat vertical fracture through the left articular pillar of C2 (coronal image 18). These are nondisplaced. The C2 pedicles and posterior elements appear to remain intact. The remaining cervical vertebrae appear intact, osteopenia limits detail of the C7 body as before. There is mild chronic degenerative appearing anterolisthesis from C3-C4 through C5-C6. Soft tissues and spinal canal: No prevertebral fluid or swelling. No visible canal hematoma. Stable ligamentous hypertrophy about the odontoid. Partially retropharyngeal course of the common carotid arteries. Otherwise negative noncontrast neck soft tissues. Disc levels: Stable facet more so than disc related degenerative changes. No cervical spinal stenosis suspected. Upper chest: Osteopenia. Stable mild T3  superior endplate compression since 04/24/2019. Calcified aortic atherosclerosis. Lung apices are clear. IMPRESSION: 1. Positive for acute C2 fracture with a combined type 2 fracture of the odontoid and a somewhat vertical fracture of the left C2 body. These are nondisplaced. 2. No other acute traumatic injury identified in the cervical spine. 3. Osteopenia.  Chronic T3 compression fracture. Study discussed by telephone with Dr. Aletta Edouard on 01/29/2020 at 21:47 . Electronically Signed   By: Genevie Ann M.D.   On: 01/29/2020 21:47   CT Maxillofacial WO CM  Result Date: 01/29/2020 CLINICAL DATA:  86 year old female status post fall striking head. On Eliquis. EXAM: CT MAXILLOFACIAL WITHOUT CONTRAST TECHNIQUE: Multidetector CT imaging of the maxillofacial structures was performed. Multiplanar CT image reconstructions were also generated. COMPARISON:  Head CT today reported separately. FINDINGS: Osseous: Osteopenia. Mandible intact. No zygoma fracture. Mildly displaced left nasal bone fracture on series 5, image 52. The anterior and posterior walls of the left maxillary sinus appear intact. No right maxilla fracture. Central skull base appears intact. There is a C2 cervical spine fracture. See the dedicated cervical spine CT today reported separately. Orbits: There is a left orbital floor fracture with mild herniation of fat and marginal herniation of the inferior rectus muscle. The remaining left orbital walls are intact. Contralateral right orbital walls are intact. Small volume intraorbital gas. No intraorbital hematoma. The left globe is intact. Superficial left periorbital hematoma or contusion. Sinuses: Hemorrhage layering in the  left maxillary sinus. Other paranasal sinuses are well pneumatized. Tympanic cavities and mastoids are clear. Soft tissues: Superficial left face and periorbital injury stated above. Negative visible noncontrast larynx, pharynx, parapharyngeal spaces, retropharyngeal space, sublingual  space, submandibular, masticator and parotid spaces. Limited intracranial: Stable from the head CT reported separately today. IMPRESSION: 1. Left orbital floor fracture with mild herniation of fat and the inferior rectus muscle. Superficial periorbital hematoma/contusion but no intraorbital hematoma. 2. Mildly displaced left nasal bone fracture. 3. No other acute facial fracture identified. 4. C2 vertebral fracture - see cervical spine CT today reported separately. Electronically Signed   By: Genevie Ann M.D.   On: 01/29/2020 21:40     Assessment/Plan: Pleasant 86 year old female fell tonight at her assisted living. CT c spine shows a nondisplaced type 2 odontoid fracture with a vertical fracture of the left C2 vertebral body. I do think that this potentially could heal in an aspen collar over the next several months. No surgical intervention needed at this time. Ok to discharge home and follow up with [place] in the office in 2 weeks for xrays.    Ocie Cornfield Corrinna Karapetyan 01/29/2020 10:40 PM

## 2020-01-29 NOTE — Progress Notes (Signed)
   01/29/20 2025  Clinical Encounter Type  Visited With Patient;Family  Visit Type Initial  Referral From Nurse  Consult/Referral To Chaplain  This chaplain responded to Level 2 Trauma in ED-Rm 46.  The chaplain introduced herself to the Pt. and Pt. RN-Meghan.  Per the RN, the chaplain delivered the message to the Pt. daughter-Betsy Stainbeck, "At this time you can not be with the Pt. because of visitor restrictions.  The Pt. is here, talking and will be moving to imaging soon."  Gwinda Passe shared her phone number, [phone] for future communications about the Pt.  The chaplain confirmed the  visit with the Pt. and the RN.  The chaplain is available for F/U spiritual care as needed.

## 2020-01-29 NOTE — Discharge Instructions (Addendum)
You were seen in the emergency department for evaluation of injuries from a fall.  You have a fracture of your left orbital floor.  Please contact Dr. Victorio Palm office for follow-up in the next 4 to 5 days.  Do not blow your nose and try to sneeze with an open mouth.  Sleep with the head of bed elevated at 45 degrees.  Ice to the affected area.  Saline nasal spray to help keep nasal passages moistened.  You also had a second cervical vertebra fracture.  Schedule an appointment with Dr. Ronnald Ramp in 2 weeks.  Cervical collar at all times.  Return to the emergency department if any acute worsening or concerning symptoms.

## 2020-01-29 NOTE — ED Notes (Signed)
This RN spoke with pt's daughter Gwinda Passe [phone]. Will update daughter as able

## 2020-01-29 NOTE — ED Notes (Signed)
Called [facility] for transport to [facility]

## 2020-01-29 NOTE — ED Triage Notes (Signed)
Pt BIB [facility] from [facility] independent living, pt fell hitting her head. Denies LOC, taking eliquis. GCS 15 at this time.

## 2020-01-30 ENCOUNTER — Encounter: Payer: Self-pay | Admitting: Cardiology

## 2020-01-30 ENCOUNTER — Encounter (HOSPITAL_COMMUNITY): Payer: Self-pay | Admitting: Emergency Medicine

## 2020-01-30 ENCOUNTER — Telehealth: Payer: Self-pay

## 2020-01-30 DIAGNOSIS — R279 Unspecified lack of coordination: Secondary | ICD-10-CM | POA: Diagnosis not present

## 2020-01-30 DIAGNOSIS — I959 Hypotension, unspecified: Secondary | ICD-10-CM | POA: Diagnosis not present

## 2020-01-30 DIAGNOSIS — Z743 Need for continuous supervision: Secondary | ICD-10-CM | POA: Diagnosis not present

## 2020-01-30 DIAGNOSIS — I4891 Unspecified atrial fibrillation: Secondary | ICD-10-CM | POA: Diagnosis not present

## 2020-01-30 DIAGNOSIS — W19XXXA Unspecified fall, initial encounter: Secondary | ICD-10-CM | POA: Diagnosis not present

## 2020-01-30 NOTE — ED Notes (Signed)
Helped pt to use the bedpan. Cleaned pt and new depends applied. Wound to left arm bleeding through dressing. Dressing removed and redressed with extra gauze and Kerlix. Band-Aid applied to left hand, old one falling off. Pt denies pain at this time. Offered drink and something to eat, but pt states that she is fine right now. Will continue to monitor.

## 2020-01-30 NOTE — ED Notes (Signed)
[facility] here for transport. Report called to [facility]. All questions answered. VSS. Pt changed before leaving. Belongings sent with pt home. Report given to [facility]. IV removed intact.

## 2020-01-30 NOTE — Telephone Encounter (Signed)
I talked with the patient's daughter who states that the patient fell back in October 2020 and has fallen 4 times since then. She states that when she talked to a physician last night he did not mention any changes to her medications so she thinks that she is still continuing anticoagulation med but will call us back tomorrow after she goes to see her to confirm. Also looks like in notes Dr. Wilburn Cornelia recommended for her to continue anticoagulation.

## 2020-01-31 NOTE — Telephone Encounter (Signed)
Please have patient call if she has any further falls

## 2020-01-31 NOTE — Telephone Encounter (Signed)
Follow up   Patient's daughter states that her mother is still in blood thinner. Please call to discuss if needed.

## 2020-01-31 NOTE — Telephone Encounter (Signed)
I check with neurosurgeon Dr. Ronnald Ramp who said she can stay on blood thinner

## 2020-01-31 NOTE — Telephone Encounter (Signed)
Advised patient's daughter that it is okay for the patient to remain on her blood thinner. She will also call and let us know if the patient has any additional falls. She also stated she is unsure if her mother will be able to have her echocardiogram done on 03/05 as she is in a brace and unable to lie down. I advised her that once she follows up with neuro we can see whether the echo can be done or needs to be rescheduled.

## 2020-02-01 DIAGNOSIS — R2689 Other abnormalities of gait and mobility: Secondary | ICD-10-CM | POA: Diagnosis not present

## 2020-02-01 DIAGNOSIS — I10 Essential (primary) hypertension: Secondary | ICD-10-CM | POA: Diagnosis not present

## 2020-02-01 DIAGNOSIS — L89152 Pressure ulcer of sacral region, stage 2: Secondary | ICD-10-CM | POA: Diagnosis not present

## 2020-02-01 DIAGNOSIS — I5032 Chronic diastolic (congestive) heart failure: Secondary | ICD-10-CM | POA: Diagnosis not present

## 2020-02-01 DIAGNOSIS — I4891 Unspecified atrial fibrillation: Secondary | ICD-10-CM | POA: Diagnosis not present

## 2020-02-01 DIAGNOSIS — L89892 Pressure ulcer of other site, stage 2: Secondary | ICD-10-CM | POA: Diagnosis not present

## 2020-02-03 DIAGNOSIS — Z03818 Encounter for observation for suspected exposure to other biological agents ruled out: Secondary | ICD-10-CM | POA: Diagnosis not present

## 2020-02-03 DIAGNOSIS — I5032 Chronic diastolic (congestive) heart failure: Secondary | ICD-10-CM | POA: Diagnosis not present

## 2020-02-03 DIAGNOSIS — I4891 Unspecified atrial fibrillation: Secondary | ICD-10-CM | POA: Diagnosis not present

## 2020-02-03 DIAGNOSIS — R2689 Other abnormalities of gait and mobility: Secondary | ICD-10-CM | POA: Diagnosis not present

## 2020-02-03 DIAGNOSIS — L89892 Pressure ulcer of other site, stage 2: Secondary | ICD-10-CM | POA: Diagnosis not present

## 2020-02-03 DIAGNOSIS — I10 Essential (primary) hypertension: Secondary | ICD-10-CM | POA: Diagnosis not present

## 2020-02-03 DIAGNOSIS — L89152 Pressure ulcer of sacral region, stage 2: Secondary | ICD-10-CM | POA: Diagnosis not present

## 2020-02-03 DIAGNOSIS — S0285XA Fracture of orbit, unspecified, initial encounter for closed fracture: Secondary | ICD-10-CM | POA: Diagnosis not present

## 2020-02-04 DIAGNOSIS — R2689 Other abnormalities of gait and mobility: Secondary | ICD-10-CM | POA: Diagnosis not present

## 2020-02-04 DIAGNOSIS — L89892 Pressure ulcer of other site, stage 2: Secondary | ICD-10-CM | POA: Diagnosis not present

## 2020-02-04 DIAGNOSIS — I4891 Unspecified atrial fibrillation: Secondary | ICD-10-CM | POA: Diagnosis not present

## 2020-02-04 DIAGNOSIS — L89152 Pressure ulcer of sacral region, stage 2: Secondary | ICD-10-CM | POA: Diagnosis not present

## 2020-02-04 DIAGNOSIS — I5032 Chronic diastolic (congestive) heart failure: Secondary | ICD-10-CM | POA: Diagnosis not present

## 2020-02-04 DIAGNOSIS — I10 Essential (primary) hypertension: Secondary | ICD-10-CM | POA: Diagnosis not present

## 2020-02-05 DIAGNOSIS — S12101D Unspecified nondisplaced fracture of second cervical vertebra, subsequent encounter for fracture with routine healing: Secondary | ICD-10-CM | POA: Diagnosis not present

## 2020-02-05 DIAGNOSIS — S51812D Laceration without foreign body of left forearm, subsequent encounter: Secondary | ICD-10-CM | POA: Diagnosis not present

## 2020-02-05 DIAGNOSIS — S0285XD Fracture of orbit, unspecified, subsequent encounter for fracture with routine healing: Secondary | ICD-10-CM | POA: Diagnosis not present

## 2020-02-05 DIAGNOSIS — W19XXXD Unspecified fall, subsequent encounter: Secondary | ICD-10-CM | POA: Diagnosis not present

## 2020-02-05 DIAGNOSIS — S022XXD Fracture of nasal bones, subsequent encounter for fracture with routine healing: Secondary | ICD-10-CM | POA: Diagnosis not present

## 2020-02-07 DIAGNOSIS — I10 Essential (primary) hypertension: Secondary | ICD-10-CM | POA: Diagnosis not present

## 2020-02-07 DIAGNOSIS — L89152 Pressure ulcer of sacral region, stage 2: Secondary | ICD-10-CM | POA: Diagnosis not present

## 2020-02-07 DIAGNOSIS — I4891 Unspecified atrial fibrillation: Secondary | ICD-10-CM | POA: Diagnosis not present

## 2020-02-07 DIAGNOSIS — R2689 Other abnormalities of gait and mobility: Secondary | ICD-10-CM | POA: Diagnosis not present

## 2020-02-07 DIAGNOSIS — I5032 Chronic diastolic (congestive) heart failure: Secondary | ICD-10-CM | POA: Diagnosis not present

## 2020-02-07 DIAGNOSIS — L89892 Pressure ulcer of other site, stage 2: Secondary | ICD-10-CM | POA: Diagnosis not present

## 2020-02-11 ENCOUNTER — Other Ambulatory Visit: Payer: Self-pay

## 2020-02-11 ENCOUNTER — Emergency Department (HOSPITAL_COMMUNITY): Payer: Medicare Other

## 2020-02-11 ENCOUNTER — Telehealth: Payer: Self-pay | Admitting: Cardiology

## 2020-02-11 ENCOUNTER — Inpatient Hospital Stay (HOSPITAL_COMMUNITY)
Admission: EM | Admit: 2020-02-11 | Discharge: 2020-02-16 | DRG: 070 | Disposition: A | Discharge status: Skilled Nursing Facility | Payer: Medicare Other | Source: Skilled Nursing Facility | Attending: Internal Medicine | Admitting: Internal Medicine

## 2020-02-11 DIAGNOSIS — C541 Malignant neoplasm of endometrium: Secondary | ICD-10-CM | POA: Diagnosis not present

## 2020-02-11 DIAGNOSIS — R4182 Altered mental status, unspecified: Secondary | ICD-10-CM

## 2020-02-11 DIAGNOSIS — R7881 Bacteremia: Secondary | ICD-10-CM

## 2020-02-11 DIAGNOSIS — Z7901 Long term (current) use of anticoagulants: Secondary | ICD-10-CM

## 2020-02-11 DIAGNOSIS — I10 Essential (primary) hypertension: Secondary | ICD-10-CM | POA: Diagnosis not present

## 2020-02-11 DIAGNOSIS — B9689 Other specified bacterial agents as the cause of diseases classified elsewhere: Secondary | ICD-10-CM | POA: Diagnosis present

## 2020-02-11 DIAGNOSIS — I130 Hypertensive heart and chronic kidney disease with heart failure and stage 1 through stage 4 chronic kidney disease, or unspecified chronic kidney disease: Secondary | ICD-10-CM | POA: Diagnosis present

## 2020-02-11 DIAGNOSIS — R600 Localized edema: Secondary | ICD-10-CM | POA: Diagnosis present

## 2020-02-11 DIAGNOSIS — Z79899 Other long term (current) drug therapy: Secondary | ICD-10-CM

## 2020-02-11 DIAGNOSIS — E785 Hyperlipidemia, unspecified: Secondary | ICD-10-CM | POA: Diagnosis present

## 2020-02-11 DIAGNOSIS — G9340 Encephalopathy, unspecified: Secondary | ICD-10-CM | POA: Diagnosis not present

## 2020-02-11 DIAGNOSIS — Z880 Allergy status to penicillin: Secondary | ICD-10-CM

## 2020-02-11 DIAGNOSIS — E782 Mixed hyperlipidemia: Secondary | ICD-10-CM | POA: Diagnosis present

## 2020-02-11 DIAGNOSIS — Z20822 Contact with and (suspected) exposure to covid-19: Secondary | ICD-10-CM | POA: Diagnosis present

## 2020-02-11 DIAGNOSIS — I4819 Other persistent atrial fibrillation: Secondary | ICD-10-CM | POA: Diagnosis not present

## 2020-02-11 DIAGNOSIS — G9341 Metabolic encephalopathy: Principal | ICD-10-CM | POA: Diagnosis present

## 2020-02-11 DIAGNOSIS — R442 Other hallucinations: Secondary | ICD-10-CM | POA: Diagnosis not present

## 2020-02-11 DIAGNOSIS — J189 Pneumonia, unspecified organism: Secondary | ICD-10-CM | POA: Diagnosis not present

## 2020-02-11 DIAGNOSIS — R55 Syncope and collapse: Secondary | ICD-10-CM | POA: Diagnosis not present

## 2020-02-11 DIAGNOSIS — Z808 Family history of malignant neoplasm of other organs or systems: Secondary | ICD-10-CM | POA: Diagnosis not present

## 2020-02-11 DIAGNOSIS — R278 Other lack of coordination: Secondary | ICD-10-CM | POA: Diagnosis not present

## 2020-02-11 DIAGNOSIS — Z8249 Family history of ischemic heart disease and other diseases of the circulatory system: Secondary | ICD-10-CM | POA: Diagnosis not present

## 2020-02-11 DIAGNOSIS — Z79818 Long term (current) use of other agents affecting estrogen receptors and estrogen levels: Secondary | ICD-10-CM | POA: Diagnosis not present

## 2020-02-11 DIAGNOSIS — S12110D Anterior displaced Type II dens fracture, subsequent encounter for fracture with routine healing: Secondary | ICD-10-CM

## 2020-02-11 DIAGNOSIS — L89892 Pressure ulcer of other site, stage 2: Secondary | ICD-10-CM | POA: Diagnosis not present

## 2020-02-11 DIAGNOSIS — S129XXD Fracture of neck, unspecified, subsequent encounter: Secondary | ICD-10-CM | POA: Diagnosis not present

## 2020-02-11 DIAGNOSIS — S0232XD Fracture of orbital floor, left side, subsequent encounter for fracture with routine healing: Secondary | ICD-10-CM

## 2020-02-11 DIAGNOSIS — Z8542 Personal history of malignant neoplasm of other parts of uterus: Secondary | ICD-10-CM

## 2020-02-11 DIAGNOSIS — N183 Chronic kidney disease, stage 3 unspecified: Secondary | ICD-10-CM | POA: Diagnosis not present

## 2020-02-11 DIAGNOSIS — I5033 Acute on chronic diastolic (congestive) heart failure: Secondary | ICD-10-CM | POA: Diagnosis present

## 2020-02-11 DIAGNOSIS — R2681 Unsteadiness on feet: Secondary | ICD-10-CM | POA: Diagnosis not present

## 2020-02-11 DIAGNOSIS — N1832 Chronic kidney disease, stage 3b: Secondary | ICD-10-CM | POA: Diagnosis present

## 2020-02-11 DIAGNOSIS — D539 Nutritional anemia, unspecified: Secondary | ICD-10-CM | POA: Diagnosis present

## 2020-02-11 DIAGNOSIS — R404 Transient alteration of awareness: Secondary | ICD-10-CM | POA: Diagnosis not present

## 2020-02-11 DIAGNOSIS — R41841 Cognitive communication deficit: Secondary | ICD-10-CM | POA: Diagnosis not present

## 2020-02-11 DIAGNOSIS — S129XXA Fracture of neck, unspecified, initial encounter: Secondary | ICD-10-CM

## 2020-02-11 DIAGNOSIS — N1831 Chronic kidney disease, stage 3a: Secondary | ICD-10-CM | POA: Diagnosis not present

## 2020-02-11 DIAGNOSIS — I5032 Chronic diastolic (congestive) heart failure: Secondary | ICD-10-CM | POA: Diagnosis not present

## 2020-02-11 DIAGNOSIS — E559 Vitamin D deficiency, unspecified: Secondary | ICD-10-CM | POA: Diagnosis present

## 2020-02-11 DIAGNOSIS — J984 Other disorders of lung: Secondary | ICD-10-CM | POA: Diagnosis not present

## 2020-02-11 DIAGNOSIS — Z7401 Bed confinement status: Secondary | ICD-10-CM | POA: Diagnosis not present

## 2020-02-11 DIAGNOSIS — M2550 Pain in unspecified joint: Secondary | ICD-10-CM | POA: Diagnosis not present

## 2020-02-11 DIAGNOSIS — Z9181 History of falling: Secondary | ICD-10-CM | POA: Diagnosis not present

## 2020-02-11 DIAGNOSIS — N8500 Endometrial hyperplasia, unspecified: Secondary | ICD-10-CM | POA: Diagnosis not present

## 2020-02-11 DIAGNOSIS — L89152 Pressure ulcer of sacral region, stage 2: Secondary | ICD-10-CM | POA: Diagnosis not present

## 2020-02-11 DIAGNOSIS — I5023 Acute on chronic systolic (congestive) heart failure: Secondary | ICD-10-CM | POA: Diagnosis not present

## 2020-02-11 DIAGNOSIS — J9811 Atelectasis: Secondary | ICD-10-CM | POA: Diagnosis present

## 2020-02-11 DIAGNOSIS — W19XXXD Unspecified fall, subsequent encounter: Secondary | ICD-10-CM | POA: Diagnosis present

## 2020-02-11 DIAGNOSIS — R2689 Other abnormalities of gait and mobility: Secondary | ICD-10-CM | POA: Diagnosis not present

## 2020-02-11 DIAGNOSIS — M6281 Muscle weakness (generalized): Secondary | ICD-10-CM | POA: Diagnosis not present

## 2020-02-11 DIAGNOSIS — N3000 Acute cystitis without hematuria: Secondary | ICD-10-CM | POA: Diagnosis not present

## 2020-02-11 DIAGNOSIS — I4891 Unspecified atrial fibrillation: Secondary | ICD-10-CM | POA: Diagnosis not present

## 2020-02-11 LAB — CBC WITH DIFFERENTIAL/PLATELET
Abs Immature Granulocytes: 0.05 10*3/uL (ref 0.00–0.07)
Basophils Absolute: 0 10*3/uL (ref 0.0–0.1)
Basophils Relative: 0 %
Eosinophils Absolute: 0 10*3/uL (ref 0.0–0.5)
Eosinophils Relative: 0 %
HCT: 33.9 % — ABNORMAL LOW (ref 36.0–46.0)
Hemoglobin: 10.7 g/dL — ABNORMAL LOW (ref 12.0–15.0)
Immature Granulocytes: 1 %
Lymphocytes Relative: 10 %
Lymphs Abs: 0.9 10*3/uL (ref 0.7–4.0)
MCH: 32.2 pg (ref 26.0–34.0)
MCHC: 31.6 g/dL (ref 30.0–36.0)
MCV: 102.1 fL — ABNORMAL HIGH (ref 80.0–100.0)
Monocytes Absolute: 1.2 10*3/uL — ABNORMAL HIGH (ref 0.1–1.0)
Monocytes Relative: 14 %
Neutro Abs: 6.3 10*3/uL (ref 1.7–7.7)
Neutrophils Relative %: 75 %
Platelets: 267 10*3/uL (ref 150–400)
RBC: 3.32 MIL/uL — ABNORMAL LOW (ref 3.87–5.11)
RDW: 13.9 % (ref 11.5–15.5)
WBC: 8.5 10*3/uL (ref 4.0–10.5)
nRBC: 0 % (ref 0.0–0.2)

## 2020-02-11 LAB — COMPREHENSIVE METABOLIC PANEL
ALT: 15 U/L (ref 0–44)
AST: 17 U/L (ref 15–41)
Albumin: 3.1 g/dL — ABNORMAL LOW (ref 3.5–5.0)
Alkaline Phosphatase: 75 U/L (ref 38–126)
Anion gap: 8 (ref 5–15)
BUN: 31 mg/dL — ABNORMAL HIGH (ref 8–23)
CO2: 25 mmol/L (ref 22–32)
Calcium: 8.9 mg/dL (ref 8.9–10.3)
Chloride: 107 mmol/L (ref 98–111)
Creatinine, Ser: 1.55 mg/dL — ABNORMAL HIGH (ref 0.44–1.00)
GFR calc Af Amer: 35 mL/min — ABNORMAL LOW (ref >60)
GFR calc non Af Amer: 30 mL/min — ABNORMAL LOW (ref >60)
Glucose, Bld: 111 mg/dL — ABNORMAL HIGH (ref 70–99)
Potassium: 4.6 mmol/L (ref 3.5–5.1)
Sodium: 140 mmol/L (ref 135–145)
Total Bilirubin: 0.6 mg/dL (ref 0.3–1.2)
Total Protein: 6.1 g/dL — ABNORMAL LOW (ref 6.5–8.1)

## 2020-02-11 LAB — URINALYSIS, ROUTINE W REFLEX MICROSCOPIC
Bilirubin Urine: NEGATIVE
Glucose, UA: NEGATIVE mg/dL
Hgb urine dipstick: NEGATIVE
Ketones, ur: NEGATIVE mg/dL
Nitrite: NEGATIVE
Protein, ur: NEGATIVE mg/dL
Specific Gravity, Urine: 1.008 (ref 1.005–1.030)
pH: 5 (ref 5.0–8.0)

## 2020-02-11 LAB — CBG MONITORING, ED: Glucose-Capillary: 93 mg/dL (ref 70–99)

## 2020-02-11 LAB — BRAIN NATRIURETIC PEPTIDE: B Natriuretic Peptide: 329.3 pg/mL — ABNORMAL HIGH (ref 0.0–100.0)

## 2020-02-11 LAB — LACTIC ACID, PLASMA: Lactic Acid, Venous: 0.9 mmol/L (ref 0.5–1.9)

## 2020-02-11 LAB — TROPONIN I (HIGH SENSITIVITY)
Troponin I (High Sensitivity): 8 ng/L (ref <18)
Troponin I (High Sensitivity): 5 ng/L (ref <18)

## 2020-02-11 MED ORDER — SODIUM CHLORIDE 0.9 % IV SOLN: 1.0000 g | INTRAVENOUS | Status: DC

## 2020-02-11 MED ORDER — FLECAINIDE ACETATE 100 MG PO TABS
100.0000 mg | ORAL_TABLET | Freq: Two times a day (BID) | ORAL | Status: DC
  Administered 2020-02-11 – 2020-02-16 (×10): 100 mg via ORAL
  Filled 2020-02-11 (×11): qty 1

## 2020-02-11 MED ORDER — SODIUM CHLORIDE 0.9 % IV SOLN
1.0000 g | Freq: Once | INTRAVENOUS | Status: AC
  Administered 2020-02-11: 1 g via INTRAVENOUS
  Filled 2020-02-11: qty 10

## 2020-02-11 MED ORDER — TORSEMIDE 20 MG PO TABS
10.0000 mg | ORAL_TABLET | Freq: Every day | ORAL | Status: DC
  Administered 2020-02-12 – 2020-02-16 (×5): 10 mg via ORAL
  Filled 2020-02-11 (×5): qty 1

## 2020-02-11 MED ORDER — SODIUM CHLORIDE 0.9 % IV SOLN
500.0000 mg | Freq: Once | INTRAVENOUS | Status: AC
  Administered 2020-02-11: 500 mg via INTRAVENOUS
  Filled 2020-02-11: qty 500

## 2020-02-11 MED ORDER — ACETAMINOPHEN 325 MG PO TABS: 325.0000 mg | ORAL_TABLET | Freq: Four times a day (QID) | ORAL | Status: DC | PRN

## 2020-02-11 MED ORDER — ATENOLOL 25 MG PO TABS
25.0000 mg | ORAL_TABLET | Freq: Two times a day (BID) | ORAL | Status: DC
  Administered 2020-02-11 – 2020-02-16 (×10): 25 mg via ORAL
  Filled 2020-02-11 (×10): qty 1

## 2020-02-11 MED ORDER — APIXABAN 2.5 MG PO TABS
2.5000 mg | ORAL_TABLET | Freq: Two times a day (BID) | ORAL | Status: DC
  Administered 2020-02-11 – 2020-02-16 (×10): 2.5 mg via ORAL
  Filled 2020-02-11 (×10): qty 1

## 2020-02-11 MED ORDER — HYDRALAZINE HCL 25 MG PO TABS
25.0000 mg | ORAL_TABLET | Freq: Two times a day (BID) | ORAL | Status: DC
  Administered 2020-02-12 – 2020-02-16 (×9): 25 mg via ORAL
  Filled 2020-02-11 (×9): qty 1

## 2020-02-11 MED ORDER — AZITHROMYCIN 500 MG PO TABS
500.0000 mg | ORAL_TABLET | Freq: Every day | ORAL | Status: AC
  Administered 2020-02-12 – 2020-02-13 (×2): 500 mg via ORAL
  Filled 2020-02-11 (×2): qty 1

## 2020-02-11 MED ORDER — ATORVASTATIN CALCIUM 10 MG PO TABS
10.0000 mg | ORAL_TABLET | Freq: Every day | ORAL | Status: DC
  Administered 2020-02-11 – 2020-02-15 (×5): 10 mg via ORAL
  Filled 2020-02-11 (×5): qty 1

## 2020-02-11 MED ORDER — ALBUTEROL SULFATE HFA 108 (90 BASE) MCG/ACT IN AERS
2.0000 | INHALATION_SPRAY | RESPIRATORY_TRACT | Status: DC | PRN
  Filled 2020-02-11: qty 6.7

## 2020-02-11 MED ORDER — FUROSEMIDE 10 MG/ML IJ SOLN
40.0000 mg | Freq: Once | INTRAMUSCULAR | Status: AC
  Administered 2020-02-11: 40 mg via INTRAVENOUS
  Filled 2020-02-11: qty 4

## 2020-02-11 MED ORDER — SODIUM CHLORIDE 0.9 % IV SOLN
1.0000 g | INTRAVENOUS | Status: DC
  Administered 2020-02-12: 1 g via INTRAVENOUS
  Filled 2020-02-11: qty 10

## 2020-02-11 MED ORDER — DOCUSATE SODIUM 100 MG PO CAPS
100.0000 mg | ORAL_CAPSULE | Freq: Two times a day (BID) | ORAL | Status: DC
  Administered 2020-02-11 – 2020-02-16 (×10): 100 mg via ORAL
  Filled 2020-02-11 (×9): qty 1

## 2020-02-11 NOTE — ED Triage Notes (Signed)
Came in via EMS from [place]; Reported episode of visual hallucinations. Patient is seeing husband that passed away several years ago as well as seeing babies. EMS reported patient recently had fall and C collar in place d/t cervical fracture.

## 2020-02-11 NOTE — H&P (Signed)
[facility] History and Physical  DYNASIA KERCHEVAL XKG:[id number] DOB: 25-Sep-1933 DOA: 02/11/2020  Referring ED provider: Nils Flack, PA PCP: Sueanne Margarita, MD   Chief Complaint: Altered Mental status   HPI: WILMER BERRYHILL is a 86 y.o. female with PMH of endometrial cancer, chronic diastolic CHF, CKD, dyslipidemia, hypertension, paroxysmal A. fib presenting for evaluation of acute encephalopathy. Patient presented via EMS from assisted living facility [facility] Per ED report, staff at ALF reporting that patient has been hallucinating about her husband who passed away several years ago. Per daughter, patient only receives outside help about 3x per week and is otherwise independent. The daughter began to notice confusion since yesterday including not knowing where she is at, reaching for inanimate objects and talking as if her late present is alive. Telephone encounter to her cardiologist today from her home health nurse noting that the patient has had worsening peripheral edema and 24 pound weight gain in the last 4 days.  On my assessment, patient is lying in bed but believes she is in a car and states "I can't get these doors open, I just need to get these doors open." She denies any pain or complaints and is disoriented. Denies fever, chills, cough, SOB, chest pain, abdominal pain, nausea, vomiting, diarrhea, constipation, dysuria, hematuria, hematochezia, melena, difficulty moving arms/legs, speech difficulty, trouble eating, confusion or any other complaints. Of note, patient had a fall on 01/29/2020 and was seen in the ED and noted to have a left orbital floor fracture and C-spine fracture. She has followed up with ENT outpatient but not yet with neurosurgery. Daughter reports worsening weakness since this fall.   In the ED: Vitals stable with mild hypertension. CBC WNL for patient, WBC non-elevated. CMP WNL for patient with Cr 1.55. BNP 329. Troponin WNL x2. UA with small leuks.  Lactate 0.9. UDS pending. Blood cultures drawn.  EKG: HR 60. Sinus rhythm. QTc 460. No STEMI. No acute change from prior.  CXR with atelectasis vs pneumonia.  CT Head:  IMPRESSION: 1. No acute intracranial pathology. 2. Age-related atrophy and chronic microvascular ischemic changes. 3. Stable left frontotemporal calcified meningioma.  Patient was given Lasix 40 mg for LE swelling. Ceftriaxone and Azithromycin given for possible CAP. Called for admission for further workup of acute encephalopathy.   Review of Systems:  Constitutional:  No weight loss, night sweats, Fevers, chills. Worsening weakness per daughter. HEENT:  No headaches, Difficulty swallowing,Tooth/dental problems,Sore throat,  No sneezing, itching, ear ache, nasal congestion, post nasal drip. Cardio-vascular:  No chest pain, Orthopnea, PND, dizziness, palpitations. Increased leg swelling present. GI:  No heartburn, indigestion, abdominal pain, nausea, vomiting, diarrhea, change in bowel habits, loss of appetite  Resp:  No shortness of breath with exertion or at rest. No excess mucus, no productive cough, No non-productive cough, No coughing up of blood.No change in color of mucus.No wheezing.No chest wall deformity  Skin:  no rash or lesions.  GU:  no dysuria, change in color of urine, no urgency or frequency. No flank pain.  Musculoskeletal:  No decreased range of motion.  Psych:  Confusion present.  Past Medical History:  Diagnosis Date  . Cancer [facility])    endometrial ca  . Chronic diastolic CHF (congestive heart failure) ([place])   . Chronic edema   . CKD (chronic kidney disease), stage III   . Dyslipidemia   . Fracture of left humerus   . History of cardiovascular stress test    Lexiscan Myoview (06/2014): No ischemia or  scar, not gated, low risk  . Hx of echocardiogram    Echo (05/02/14): EF 60% to 65%. No regional wall motion abnormalities. Mild AI, mildly dilated aortic root (37 mm), trivial MR, trivial TR   . Hyperlipidemia   . Hypertension   . Lipoma of skin    multiple  . Osteopenia   . PAF (paroxysmal atrial fibrillation) ([place])    failed DCCV 05/2014 >> Flecainide started >> DCCV 7/15 sucessful;  f/u ETT neg for pro-arrhythmia >> recurrent AF/AFL >> Flecainide inc to 100 bid with repeat DCCV 9/15  . Vitamin D deficiency 09/25/2019   Past Surgical History:  Procedure Laterality Date  . CARDIOVERSION N/A 06/05/2014   Procedure: CARDIOVERSION;  Surgeon: Sueanne Margarita, MD;  Location: [facility];  Service: Cardiovascular;  Laterality: N/A;  . CARDIOVERSION N/A 07/11/2014   Procedure: CARDIOVERSION;  Surgeon: Sueanne Margarita, MD;  Location[facility] ENDOSCOPY;  Service: Cardiovascular;  Laterality: N/A;  . CARDIOVERSION N/A 09/04/2014   Procedure: CARDIOVERSION;  Surgeon: Sueanne Margarita, MD;  Location: [facility] ENDOSCOPY;  Service: Cardiovascular;  Laterality: N/A;  . COLONOSCOPY  2008  . COLONOSCOPY N/A 05/20/2015   Procedure: COLONOSCOPY;  Surgeon: Ronald Lobo, MD;  Location: [facility] ENDOSCOPY;  Service: Endoscopy;  Laterality: N/A;  . FEMUR IM NAIL Right 09/24/2019   Procedure: INTRAMEDULLARY (IM) NAIL FEMORAL;  Surgeon: Altamese Hayden, MD;  Location: [place];  Service: Orthopedics;  Laterality: Right;  . IR KYPHO EA ADDL LEVEL THORACIC OR LUMBAR  04/12/2019  . IR KYPHO THORACIC WITH BONE BIOPSY  04/12/2019  . ROBOTIC ASSISTED SUPRACERVICAL HYSTERECTOMY WITH BILATERAL SALPINGO OOPHERECTOMY  11/06/2012   and bilateral pelvic lymph node dissection   Social History:  reports that she has never smoked. She has never used smokeless tobacco. She reports that she does not drink alcohol or use drugs.  Allergies  Allergen Reactions  . Penicillins Itching and Other (See Comments)    Dizziness to the patient of passing out- told by MD to "never take this again" Did it involve swelling of the face/tongue/throat, SOB, or low BP? No Did it involve sudden or severe rash/hives, skin peeling, or any reaction on the inside of  your mouth or nose? No Did you need to seek medical attention at a hospital or doctor's office? No When did it last happen?"a long time ago" If all above answers are "NO", may proceed with cephalosporin use.     Family History  Problem Relation Age of Onset  . Cancer Father 82       metastatic oropharyngeal ca  . Heart attack Mother   . Coronary artery disease Mother     Prior to Admission medications   Medication Sig Start Date End Date Taking? Authorizing Provider  acetaminophen (TYLENOL) 325 MG tablet Take 1-2 tablets (325-650 mg total) by mouth every 6 (six) hours as needed for mild pain (pain score 1-3 or temp > 100.5). 10/11/19   Angiulli, Lavon Paganini, PA-C  albuterol (VENTOLIN HFA) 108 (90 Base) MCG/ACT inhaler Inhale 2 puffs into the lungs every 4 (four) hours as needed for wheezing or shortness of breath (please try to use ALbuterol inhaler PRIOR to Nebs). 10/11/19   Angiulli, Lavon Paganini, PA-C  apixaban (ELIQUIS) 2.5 MG TABS tablet Take 2.5 mg by mouth 2 (two) times daily.    [provider]  atenolol (TENORMIN) 25 MG tablet Take 1 tablet (25 mg total) by mouth 2 (two) times daily. 11/11/19   Sueanne Margarita, MD  atenolol (TENORMIN)  25 MG tablet Take 25 mg by mouth in the morning and at bedtime. 01/21/20   [provider]  atorvastatin (LIPITOR) 10 MG tablet Take 1 tablet (10 mg total) by mouth at bedtime. 10/11/19   Angiulli, Lavon Paganini, PA-C  atorvastatin (LIPITOR) 10 MG tablet Take 10 mg by mouth at bedtime.  01/21/20   [provider]  docusate sodium (COLACE) 100 MG capsule Take 1 capsule (100 mg total) by mouth 2 (two) times daily. 09/27/19   Antonieta Pert, MD  ELIQUIS 2.5 MG TABS tablet Take 2.5 mg by mouth 2 (two) times daily. 01/20/20   [provider]  flecainide (TAMBOCOR) 100 MG tablet Take 1 tablet (100 mg total) by mouth 2 (two) times daily. 10/11/19   Angiulli, Lavon Paganini, PA-C  flecainide (TAMBOCOR) 100 MG tablet Take 100 mg by mouth 2 (two)  times daily. 12/23/19   [provider]  hydrALAZINE (APRESOLINE) 25 MG tablet Take 1 tablet (25 mg total) by mouth 2 (two) times daily. 11/11/19   Sueanne Margarita, MD  megestrol (MEGACE) 40 MG tablet Take 80 mg by mouth 2 (two) times daily. Take for 3 weeks , then alternate with tamoxifen 20 mg 1 daily for 3 weeks, etc.    [provider]  NYSTATIN powder Apply 1 g topically See admin instructions. Spread topically to area under bilateral breasts & groin twice a day at 8AM and 8PM. 01/29/20   [provider]  potassium chloride SA (K-DUR) 20 MEQ tablet Take 1 tablet (20 mEq total) by mouth 2 (two) times daily. 08/08/19   Sueanne Margarita, MD  potassium chloride SA (KLOR-CON) 20 MEQ tablet Take 20 mEq by mouth 2 (two) times daily. 01/07/20   [provider]  tamoxifen (NOLVADEX) 20 MG tablet Take 20 mg by mouth 2 (two) times daily. Alternate every 3 weeks with megestrol. 02/07/19   [provider]  tamoxifen (NOLVADEX) 20 MG tablet Take 20 mg by mouth daily. 01/16/20   [provider]  torsemide (DEMADEX) 10 MG tablet Take 1 tablet (10 mg total) by mouth daily. 10/12/19   Angiulli, Lavon Paganini, PA-C  torsemide (DEMADEX) 10 MG tablet Take 10 mg by mouth daily. 01/20/20   [provider]  vitamin C (VITAMIN C) 500 MG tablet Take 1 tablet (500 mg total) by mouth daily. 09/28/19   Antonieta Pert, MD  Vitamin D, Ergocalciferol, (DRISDOL) 1.25 MG (50000 UT) CAPS capsule Take 1 capsule (50,000 Units total) by mouth every 7 (seven) days. 10/11/19   Angiulli, Lavon Paganini, PA-C  Vitamin D3 (VITAMIN D) 25 MCG tablet Take 2 tablets (2,000 Units total) by mouth 2 (two) times daily. 10/11/19   Cathlyn Parsons, PA-C   Physical Exam: Vitals:   02/11/20 1900 02/11/20 2017 02/11/20 2030 02/11/20 2100  BP: (!) 138/47 (!) 152/58 (!) 138/52 (!) 151/51  Pulse: 68  70 66  Resp: (!) 26 18 15 15   Temp:      SpO2: 100%  99% 99%    Wt Readings from Last 3 Encounters:   01/29/20 77.6 kg  10/12/19 76.4 kg  09/24/19 80.9 kg    General:  Resting in bed with C-collar on. Oriented to person only. Believes in is April 2012 and that she is in a car. Eyes: EOMI, normal lids, irises & conjunctiva ENT: grossly normal hearing, lips & tongue Neck: no LAD, masses or thyromegaly Cardiovascular: RRR, no m/r/g. 2+ pitting edema to knees bilaterally. LE wrapped with ace bandages.  Respiratory: Decreased breath sounds in left lower lobe but otherwise overall clear. Normal respiratory effort. Abdomen: soft, ntnd Skin: no rash or induration seen on limited exam Musculoskeletal: grossly normal tone BUE/BLE Psychiatric: grossly normal mood and affect, speech fluent and appropriate Neurologic: grossly non-focal.          Labs on Admission:  Basic Metabolic Panel: Recent Labs  Lab 02/11/20 1506  NA 140  K 4.6  CL 107  CO2 25  GLUCOSE 111*  BUN 31*  CREATININE 1.55*  CALCIUM 8.9   Liver Function Tests: Recent Labs  Lab 02/11/20 1506  AST 17  ALT 15  ALKPHOS 75  BILITOT 0.6  PROT 6.1*  ALBUMIN 3.1*   No results for input(s): LIPASE, AMYLASE in the last 168 hours. No results for input(s): AMMONIA in the last 168 hours. CBC: Recent Labs  Lab 02/11/20 1506  WBC 8.5  NEUTROABS 6.3  HGB 10.7*  HCT 33.9*  MCV 102.1*  PLT 267   Cardiac Enzymes: No results for input(s): CKTOTAL, CKMB, CKMBINDEX, TROPONINI in the last 168 hours.  BNP (last 3 results) Recent Labs    05/07/19 1400 09/30/19 1054 02/11/20 1506  BNP 186.6* 270.5* 329.3*    ProBNP (last 3 results) No results for input(s): PROBNP in the last 8760 hours.  CBG: Recent Labs  Lab 02/11/20 1541  GLUCAP 93    Radiological Exams on Admission: CT Head Wo Contrast  Result Date: 02/11/2020 CLINICAL DATA:  86 year old female with altered mental status. EXAM: CT HEAD WITHOUT CONTRAST TECHNIQUE: Contiguous axial images were obtained from the base of the skull through the vertex without  intravenous contrast. COMPARISON:  Head CT dated 01/29/2020. FINDINGS: Brain: There is mild age-related atrophy and chronic microvascular ischemic changes. There is no acute intracranial hemorrhage. No midline shift. No extra-axial fluid collection. Stable calcified meningioma measuring approximately 2.6 x 1.7 cm from the inner table of the left frontotemporal calvarium. Vascular: No hyperdense vessel or unexpected calcification. Skull: Normal. Negative for fracture or focal lesion. Sinuses/Orbits: There is mucoperiosteal thickening of the left maxillary sinus. Old left orbital floor fracture as well as old fracture of the left nasal bone. No air-fluid level. The mastoid air cells are clear. Other: None IMPRESSION: 1. No acute intracranial pathology. 2. Age-related atrophy and chronic microvascular ischemic changes. 3. Stable left frontotemporal calcified meningioma. Electronically Signed   By: Anner Crete M.D.   On: 02/11/2020 16:09   DG Chest Portable 1 View  Result Date: 02/11/2020 CLINICAL DATA:  Loss consciousness EXAM: PORTABLE CHEST 1 VIEW COMPARISON:  09/29/2019 FINDINGS: There is lingular airspace disease which may left reflect atelectasis versus pneumonia. There is no pleural effusion or pneumothorax. The heart and mediastinal contours are unremarkable. There is thoracic aortic atherosclerosis. There is no acute osseous abnormality. IMPRESSION: Lingular airspace disease which may left reflect atelectasis versus pneumonia. Electronically Signed   By: Kathreen Devoid   On: 02/11/2020 15:28    EKG: Independently reviewed. HR 60. Sinus rhythm. QTc 460. No STEMI. No acute change from prior.   Assessment/Plan Principal Problem:   Acute encephalopathy Active Problems:   Endometrial ca [facility])   Essential hypertension, benign   Mixed hyperlipidemia   Persistent atrial fibrillation (HCC)   Edema of extremities   Chronic diastolic heart failure (HCC)   Stage 3 chronic kidney disease   Cervical  spine fracture (HCC)   CAP (community acquired pneumonia)  86 y.o. female with PMH of endometrial cancer, chronic diastolic CHF, CKD, dyslipidemia, hypertension, paroxysmal  A. fib presenting for evaluation of acute encephalopathy.  1. Acute Encephalopathy (possible CAP, possible UTI, possible sequela from fall) - Unclear etiology; likely multifactorial - Review of home meds does not reveal likely cause - TSH ordered  - AMS per daughter over last day and worsening weakness since recent fall - Treatment for possible CAP with continued Azithromycin and Rocephin for 5 day total course - Treatment for possible UTI with continued Rocephin x 3 days; follow urine culture - Follow blood cultures - F/u UDS - CT Head stable  - Cont Tele for at least 12 hours; Trop neg x2 on admission - Repeat Echo; see below - patient with recent c-spine fracture and wearing c-collar; conservative management per last Neurosurgery note when in ED after fall but has not yet followed up  2. Chronic diastolic HF - In Oct 1610: 2D echo with normal LVF and diastolic dysfunction - BNP mildly elevated from prior to 329 on admission  - Per last Cards visit November 2020: had been on Torsemide 40mg  BID but got volume depleted and had dizziness with what sounds like a syncopal episode resulting in hip fx>now on Torsemide 10mg  daily - still has LE edema but much improved on thigh high compression hose - cont Torsemide 10 mg daily - S/p 40 mg IV Lasix in ED with good UOP; may need further diuresis - Repeat Echo - Obtain weight to see if patient has actually gained 24 lbs in last 4 days - Cr stable  - <2gm Na diet  3. Persistent atrial fibrillation - NSR currently  - continue PTA: Atenolol 25mg  BID, Flecainide 100mg  BID and Apixaban  4. HTN - at goal for age on admission  - continue Atenolol 25mg  BID and Hydralazine 25mg  BID  5. CKD stage 3a -creatinine stable at 1.55 on admission - Daily CMP  6. Endometrial CA -  holding Megace and Tamoxifen currently during admission   Code Status: Full Code per patient request  DVT Prophylaxis: PTA Eliquis Family Communication: None. ED provider spoke with daughter at bedside who had left by my arrival. Disposition Plan: Admit to inpatient with Telemetry. Patient with multiple chronic co-morbidities and is at high risk for further decompensation. She is currently disoriented and unable to discharge back to prior living arrangement. Treatment for infection and will workup possible worsening CHF. Possible sequela of recent fall as well.   Time spent: 70 minutes  [facility] Pager [phone]

## 2020-02-11 NOTE — Telephone Encounter (Signed)
Elmer Sow with [facility] called to report the pt is not having any improvement in her peripheral edema... it is up to her knees and 2+ pitting. She is not uncomfortable and able to go her normal ADL's.   But, her weight has increased 24 lbs in 4 days.. she is having to weight her in her wheelchair and subtract the wheelchair weight but she feels is accurate.   2/26 169.8 lbs  02/11/20 194.2  Pts lungs sound clear. She is eating and drinking well without added NA.   Pt has Echo and follow up with Dr. Radford Pax 02/14/20.   Will forward to Dr. Radford Pax for review and any recommendations prior to her 02/14/20 OV.

## 2020-02-11 NOTE — ED Provider Notes (Signed)
[facility] EMERGENCY DEPARTMENT Provider Note   CSN: [id number] Arrival date & time: 02/11/20  1452     History Chief Complaint  Patient presents with  . Hallucinations    Christina Morgan is a 86 y.o. female with history of endometrial cancer, chronic diastolic CHF, CKD, dyslipidemia, hypertension, paroxysmal A. fib presenting for evaluation of altered mental status.  She presents from [facility] where they report that she was hallucinating her husband who passed away 7 years ago.  She tells me that she felt that she was at the beach and asked to see her husband even though she knows that he has been dead for 7 years.  She is oriented to person place and month but does not know the day of the week.  There is also report of possible malodorous urine.  Patient presently denies fevers, chest pain, shortness of breath, abdominal pain, nausea, vomiting, headaches, vision changes, numbness or weakness of the extremities.  Of note, she sustained a fall on 01/29/2020 and was seen in the ED and noted to have a left orbital floor fracture and C2s fracture.  It appears she has followed up with ENT outpatient but not yet with neurosurgery.  There is also a telephone encounter to her cardiologist today from her home health nurse noting that the patient has had worsening peripheral edema and 24 pound weight gain in the last 4 days.  I later spoke with the patient's daughter who was at the bedside.  She reports that the patient has been in assisted living facility, not a skilled nursing facility and does not receive regular care outside of her home health aides that, 3 times a week.  She noted the patient to be altered since yesterday, wherein the patient asked when her husband was coming home.  She suggested that she thought she was at the beach.  She has been reaching out and attempting to grab at things that are not there.  She has not been answering questions  appropriately.  Daughter also notes that more recently she has become progressively more weak.  Prior to her fall in February she was ambulatory with the aid of a walker but here recently she has been having very significant difficulty even standing and transferring.  She is concerned that the patient will not be able to go to her upcoming appointments with neurosurgery and her cardiologist.  The history is provided by the patient and medical records.       Past Medical History:  Diagnosis Date  . Cancer [facility])    endometrial ca  . Chronic diastolic CHF (congestive heart failure) ([place])   . Chronic edema   . CKD (chronic kidney disease), stage III   . Dyslipidemia   . Fracture of left humerus   . History of cardiovascular stress test    Lexiscan Myoview (06/2014): No ischemia or scar, not gated, low risk  . Hx of echocardiogram    Echo (05/02/14): EF 60% to 65%. No regional wall motion abnormalities. Mild AI, mildly dilated aortic root (37 mm), trivial MR, trivial TR  . Hyperlipidemia   . Hypertension   . Lipoma of skin    multiple  . Osteopenia   . PAF (paroxysmal atrial fibrillation) ([place])    failed DCCV 05/2014 >> Flecainide started >> DCCV 7/15 sucessful;  f/u ETT neg for pro-arrhythmia >> recurrent AF/AFL >> Flecainide inc to 100 bid with repeat DCCV 9/15  . Vitamin D deficiency 09/25/2019  Patient Active Problem List   Diagnosis Date Noted  . Acute encephalopathy 02/11/2020  . Cervical spine fracture ([place]) 02/11/2020  . CAP (community acquired pneumonia) 02/11/2020  . Chronic diastolic congestive heart failure ([place])   . Stage 3 chronic kidney disease   . PAF (paroxysmal atrial fibrillation) ([place])   . Closed displaced intertrochanteric fracture of right femur ([place])   . Fall 09/23/2019  . SOB (shortness of breath) 01/04/2019  . Bilateral lower leg cellulitis 03/30/2018  . Chronic diastolic heart failure ([place]) 03/30/2018  . Edema of extremities 08/19/2014  . Essential  hypertension, benign 05/15/2014  . Mixed hyperlipidemia 05/15/2014  . Persistent atrial fibrillation ([place]) 05/15/2014  . Endometrial ca [facility]) 12/28/2012    Past Surgical History:  Procedure Laterality Date  . CARDIOVERSION N/A 06/05/2014   Procedure: CARDIOVERSION;  Surgeon: Sueanne Margarita, MD;  Location: [place];  Service: Cardiovascular;  Laterality: N/A;  . CARDIOVERSION N/A 07/11/2014   Procedure: CARDIOVERSION;  Surgeon: Sueanne Margarita, MD;  Location: [facility] ENDOSCOPY;  Service: Cardiovascular;  Laterality: N/A;  . CARDIOVERSION N/A 09/04/2014   Procedure: CARDIOVERSION;  Surgeon: Sueanne Margarita, MD;  Location: [facility] ENDOSCOPY;  Service: Cardiovascular;  Laterality: N/A;  . COLONOSCOPY  2008  . COLONOSCOPY N/A 05/20/2015   Procedure: COLONOSCOPY;  Surgeon: Ronald Lobo, MD;  Location: [facility] ENDOSCOPY;  Service: Endoscopy;  Laterality: N/A;  . FEMUR IM NAIL Right 09/24/2019   Procedure: INTRAMEDULLARY (IM) NAIL FEMORAL;  Surgeon: Altamese Zia [place], MD;  Location: [place];  Service: Orthopedics;  Laterality: Right;  . IR KYPHO EA ADDL LEVEL THORACIC OR LUMBAR  04/12/2019  . IR KYPHO THORACIC WITH BONE BIOPSY  04/12/2019  . ROBOTIC ASSISTED SUPRACERVICAL HYSTERECTOMY WITH BILATERAL SALPINGO OOPHERECTOMY  11/06/2012   and bilateral pelvic lymph node dissection     OB History   No obstetric history on file.    Obstetric Comments  G2P2 with 2 vaginal deliveries. Reports menarche in her teens and menopause in her 30 s. Denies hx of abnormal pap smears. Patient reports last mammogram was 04/2012.        Family History  Problem Relation Age of Onset  . Cancer Father 18       metastatic oropharyngeal ca  . Heart attack Mother   . Coronary artery disease Mother     Social History   Tobacco Use  . Smoking status: Never Smoker  . Smokeless tobacco: Never Used  Substance Use Topics  . Alcohol use: No  . Drug use: No    Home Medications Prior to Admission medications   Medication Sig Start  Date End Date Taking? Authorizing Provider  albuterol (VENTOLIN HFA) 108 (90 Base) MCG/ACT inhaler Inhale 2 puffs into the lungs every 4 (four) hours as needed for wheezing or shortness of breath (please try to use ALbuterol inhaler PRIOR to Nebs). 10/11/19  Yes Angiulli, Lavon Paganini, PA-C  apixaban (ELIQUIS) 2.5 MG TABS tablet Take 2.5 mg by mouth 2 (two) times daily.   Yes [provider]  atenolol (TENORMIN) 25 MG tablet Take 1 tablet (25 mg total) by mouth 2 (two) times daily. 11/11/19  Yes Turner, Eber Hong, MD  atorvastatin (LIPITOR) 10 MG tablet Take 1 tablet (10 mg total) by mouth at bedtime. 10/11/19  Yes Angiulli, Lavon Paganini, PA-C  [place] Saline Nasal No-Drip GEL Place 1 application into both nostrils 2 (two) times daily.   Yes [provider]  Cholecalciferol (VITAMIN D3) 125 MCG (5000 UT) CAPS Take 5,000 Units by mouth  daily.   Yes [provider]  docusate sodium (COLACE) 100 MG capsule Take 1 capsule (100 mg total) by mouth 2 (two) times daily. Patient taking differently: Take 100 mg by mouth daily as needed for mild constipation.  09/27/19  Yes Antonieta Pert, MD  flecainide (TAMBOCOR) 100 MG tablet Take 1 tablet (100 mg total) by mouth 2 (two) times daily. 10/11/19  Yes Angiulli, Lavon Paganini, PA-C  hydrALAZINE (APRESOLINE) 25 MG tablet Take 1 tablet (25 mg total) by mouth 2 (two) times daily. 11/11/19  Yes Turner, Eber Hong, MD  lisinopril (ZESTRIL) 5 MG tablet Take 5 mg by mouth daily.   Yes [provider]  megestrol (MEGACE) 40 MG tablet Take 80 mg by mouth 2 (two) times daily.    Yes [provider]  neomycin-bacitracin-polymyxin (NEOSPORIN) [phone] ointment Apply 1 application topically See admin instructions. Apply to topical abrasion 2 times a day   Yes [provider]  NYSTATIN powder Apply 1 g topically See admin instructions. Spread topically to area under bilateral breasts & groin twice a day at 8 AM and 8 PM 01/29/20  Yes [provider]  potassium chloride SA (K-DUR) 20 MEQ tablet Take 1 tablet (20 mEq total) by mouth 2 (two) times daily. 08/08/19  Yes Turner, Eber Hong, MD  sodium chloride (OCEAN) 0.65 % SOLN nasal spray Place 1 spray into both nostrils 4 (four) times daily.   Yes [provider]  tamoxifen (NOLVADEX) 20 MG tablet Take 20 mg by mouth daily. 01/16/20  Yes [provider]  torsemide (DEMADEX) 10 MG tablet Take 1 tablet (10 mg total) by mouth daily. 10/12/19  Yes Angiulli, Lavon Paganini, PA-C  traMADol (ULTRAM) 50 MG tablet Take 50 mg by mouth every 6 (six) hours as needed (for pain). 02/10/20 02/25/20 Yes [provider]  vitamin C (VITAMIN C) 500 MG tablet Take 1 tablet (500 mg total) by mouth daily. 09/28/19  Yes Antonieta Pert, MD  acetaminophen (TYLENOL) 325 MG tablet Take 1-2 tablets (325-650 mg total) by mouth every 6 (six) hours as needed for mild pain (pain score 1-3 or temp > 100.5). Patient not taking: Reported on 02/11/2020 10/11/19   Angiulli, Lavon Paganini, PA-C  Vitamin D, Ergocalciferol, (DRISDOL) 1.25 MG (50000 UT) CAPS capsule Take 1 capsule (50,000 Units total) by mouth every 7 (seven) days. Patient not taking: Reported on 02/11/2020 10/11/19   Angiulli, Lavon Paganini, PA-C    Allergies    Penicillins  Review of Systems   Review of Systems  Constitutional: Negative for chills and fever.  Respiratory: Negative for shortness of breath.   Cardiovascular: Positive for leg swelling. Negative for chest pain.  Gastrointestinal: Negative for abdominal pain, diarrhea, nausea and vomiting.  Psychiatric/Behavioral: Positive for confusion and hallucinations.  All other systems reviewed and are negative.   Physical Exam Updated Vital Signs BP (!) 151/51   Pulse 66   Temp 98.3 F (36.8 C)   Resp 15   SpO2 99%   Physical Exam Vitals and nursing note reviewed.  Constitutional:      General: She is not in acute distress.    Appearance: She is well-developed.  HENT:     Head:  Normocephalic and atraumatic.  Eyes:     General:        Right eye: No discharge.        Left eye: No discharge.     Conjunctiva/sclera: Conjunctivae normal.  Neck:     Vascular: No JVD.  Trachea: No tracheal deviation.  Cardiovascular:     Rate and Rhythm: Normal rate.     Pulses: Normal pulses.     Comments: 2+ pitting edema of the bilateral lower extremities up to the knees Pulmonary:     Effort: Pulmonary effort is normal.     Breath sounds: Normal breath sounds.  Abdominal:     General: Bowel sounds are normal. There is no distension.     Palpations: Abdomen is soft.     Tenderness: There is no abdominal tenderness. There is no guarding or rebound.  Skin:    General: Skin is warm and dry.     Findings: No erythema.  Neurological:     Mental Status: She is alert. She is confused.     GCS: GCS eye subscore is 4. GCS verbal subscore is 5. GCS motor subscore is 6.     Comments: Mental Status:  Alert, thought content appropriate, able to give a coherent history. Speech fluent without evidence of aphasia. Able to follow 2 step commands without difficulty.  Cranial Nerves:  II:  Peripheral visual fields grossly normal, pupils equal, round, reactive to light III,IV, VI: ptosis not present, extra-ocular motions intact bilaterally  V,VII: smile symmetric, facial light touch sensation equal VIII: hearing grossly normal to voice  X: uvula elevates symmetrically  XI: bilateral shoulder shrug symmetric and strong XII: midline tongue extension without fassiculations Motor:  Normal tone. 5/5 strength of BUE and BLE major muscle groups including strong and equal grip strength and dorsiflexion/plantar flexion Sensory: light touch normal in all extremities.   Psychiatric:        Behavior: Behavior normal.     ED Results / Procedures / Treatments   Labs (all labs ordered are listed, but only abnormal results are displayed) Labs Reviewed  CBC WITH DIFFERENTIAL/PLATELET - Abnormal;  Notable for the following components:      Result Value   RBC 3.32 (*)    Hemoglobin 10.7 (*)    HCT 33.9 (*)    MCV 102.1 (*)    Monocytes Absolute 1.2 (*)    All other components within normal limits  COMPREHENSIVE METABOLIC PANEL - Abnormal; Notable for the following components:   Glucose, Bld 111 (*)    BUN 31 (*)    Creatinine, Ser 1.55 (*)    Total Protein 6.1 (*)    Albumin 3.1 (*)    GFR calc non Af Amer 30 (*)    GFR calc Af Amer 35 (*)    All other components within normal limits  URINALYSIS, ROUTINE W REFLEX MICROSCOPIC - Abnormal; Notable for the following components:   Color, Urine STRAW (*)    Leukocytes,Ua SMALL (*)    Bacteria, UA RARE (*)    All other components within normal limits  BRAIN NATRIURETIC PEPTIDE - Abnormal; Notable for the following components:   B Natriuretic Peptide 329.3 (*)    All other components within normal limits  URINE CULTURE  SARS CORONAVIRUS 2 (TAT 6-24 HRS)  CULTURE, BLOOD (ROUTINE X 2)  CULTURE, BLOOD (ROUTINE X 2)  LACTIC ACID, PLASMA  RAPID URINE DRUG SCREEN, HOSP PERFORMED  LACTIC ACID, PLASMA  CBG MONITORING, ED  TROPONIN I (HIGH SENSITIVITY)  TROPONIN I (HIGH SENSITIVITY)    EKG EKG Interpretation  Date/Time:  Tuesday February 11 2020 15:08:27 EST Ventricular Rate:  69 PR Interval:    QRS Duration: 101 QT Interval:  429 QTC Calculation: 460 R Axis:   58 Text Interpretation: Sinus rhythm  Prolonged PR interval Baseline wander in lead(s) V3 No significant change since last tracing Confirmed by Wandra Arthurs ([id number]) on 02/11/2020 4:23:33 PM   Radiology CT Head Wo Contrast  Result Date: 02/11/2020 CLINICAL DATA:  86 year old female with altered mental status. EXAM: CT HEAD WITHOUT CONTRAST TECHNIQUE: Contiguous axial images were obtained from the base of the skull through the vertex without intravenous contrast. COMPARISON:  Head CT dated 01/29/2020. FINDINGS: Brain: There is mild age-related atrophy and chronic microvascular  ischemic changes. There is no acute intracranial hemorrhage. No midline shift. No extra-axial fluid collection. Stable calcified meningioma measuring approximately 2.6 x 1.7 cm from the inner table of the left frontotemporal calvarium. Vascular: No hyperdense vessel or unexpected calcification. Skull: Normal. Negative for fracture or focal lesion. Sinuses/Orbits: There is mucoperiosteal thickening of the left maxillary sinus. Old left orbital floor fracture as well as old fracture of the left nasal bone. No air-fluid level. The mastoid air cells are clear. Other: None IMPRESSION: 1. No acute intracranial pathology. 2. Age-related atrophy and chronic microvascular ischemic changes. 3. Stable left frontotemporal calcified meningioma. Electronically Signed   By: Anner Crete M.D.   On: 02/11/2020 16:09   DG Chest Portable 1 View  Result Date: 02/11/2020 CLINICAL DATA:  Loss consciousness EXAM: PORTABLE CHEST 1 VIEW COMPARISON:  09/29/2019 FINDINGS: There is lingular airspace disease which may left reflect atelectasis versus pneumonia. There is no pleural effusion or pneumothorax. The heart and mediastinal contours are unremarkable. There is thoracic aortic atherosclerosis. There is no acute osseous abnormality. IMPRESSION: Lingular airspace disease which may left reflect atelectasis versus pneumonia. Electronically Signed   By: Kathreen Devoid   On: 02/11/2020 15:28    Procedures Procedures (including critical care time)  Medications Ordered in ED Medications  cefTRIAXone (ROCEPHIN) 1 g in sodium chloride 0.9 % 100 mL IVPB (1 g Intravenous New Bag/Given 02/11/20 2053)  azithromycin (ZITHROMAX) 500 mg in sodium chloride 0.9 % 250 mL IVPB (has no administration in time range)  furosemide (LASIX) injection 40 mg (40 mg Intravenous Given 02/11/20 1709)    ED Course  I have reviewed the triage vital signs and the nursing notes.  Pertinent labs & imaging results that were available during my care of the  patient were reviewed by me and considered in my medical decision making (see chart for details).   MDM Rules/Calculators/A&P                      Christina Morgan was evaluated in Emergency Department on 02/11/2020 for the symptoms described in the history of present illness. She was evaluated in the context of the global COVID-19 pandemic, which necessitated consideration that the patient might be at risk for infection with the SARS-CoV-2 virus that causes COVID-19. Institutional protocols and algorithms that pertain to the evaluation of patients at risk for COVID-19 are in a state of rapid change based on information released by regulatory bodies including the CDC and federal and state organizations. These policies and algorithms were followed during the patient's care in the ED.  Patient brought into the ED for evaluation of altered mental status, hallucinations.  Has had reportedly malodorous urine, progressively worsening bilateral lower extremity edema and possibly 24 pound weight gain in the last 12 weeks.  She is afebrile, intermittently tachypneic and hypertensive in the ED.  She is oriented to person and place but not time.  Appears mildly confused.  No focal neurologic deficits on examination.  She does appear  generally weak.  Lab work reviewed and interpreted by myself shows no leukocytosis, stable anemia, renal insufficiency around patient's baseline.  No metabolic derangements.  BNP is a little above patient's baseline and chest x-ray shows possible atelectasis versus developing infiltrate.  She was given a dose of IV Lasix with good urine output.  Her UA is suggestive of UTI.  Will start on IV Rocephin and azithromycin.  Both UTI and pneumonia could potentially explain the patient's altered mental status.  Also consider polypharmacy the patient has not had any new medications and her hydrocodone was discontinued four days ago.  Lactate is within normal limits and she does not appear  overtly septic at this time.  Spoke with Dr. Marice Potter with [facility] hospitalist service who agrees to assume care of patient and bring her into the hospital for further evaluation and management.  Patient was seen and evaluated by Dr. Darl Householder who agrees with assessment and plan at this time.   Final Clinical Impression(s) / ED Diagnoses Final diagnoses:  Altered mental status, unspecified altered mental status type  Acute cystitis without hematuria    Rx / DC Orders ED Discharge Orders    None       Debroah Baller 02/11/20 2112    Truddie Hidden, MD 02/12/20 1009

## 2020-02-11 NOTE — ED Notes (Signed)
All meds given per MAR. Name/DOB verified with pt 

## 2020-02-11 NOTE — ED Notes (Signed)
Bladder scan 268 mL.

## 2020-02-11 NOTE — Telephone Encounter (Signed)
Spoke with daughter and she states pt is actually in ED now because she was not acting right and was completely confused.

## 2020-02-11 NOTE — Telephone Encounter (Signed)
Have her come in at lunch

## 2020-02-11 NOTE — Telephone Encounter (Signed)
New Message  Lurline Idol from [facility] is calling in about patient's weight gain.   Pt c/o swelling: STAT is pt has developed SOB within 24 hours  1) How much weight have you gained and in what time span? 24.4 lbs in 4 days  2) If swelling, where is the swelling located? Legs  3) Are you currently taking a fluid pill? Yes  4) Are you currently SOB? No  5) Do you have a log of your daily weights (if so, list)? 169.8 on 02/07/2020 and 194.2 on 02/11/20  6) Have you gained 3 pounds in a day or 5 pounds in a week? yes  7) Have you traveled recently? No

## 2020-02-11 NOTE — Telephone Encounter (Signed)
There are no openings tomorrow with APPs.  You have that blocked 4pm slot for sleep.  Ok to use that?

## 2020-02-11 NOTE — Telephone Encounter (Signed)
Please get her into office with extender tomorrow

## 2020-02-12 ENCOUNTER — Inpatient Hospital Stay (HOSPITAL_COMMUNITY): Payer: Medicare Other

## 2020-02-12 DIAGNOSIS — I5023 Acute on chronic systolic (congestive) heart failure: Secondary | ICD-10-CM

## 2020-02-12 DIAGNOSIS — J189 Pneumonia, unspecified organism: Secondary | ICD-10-CM

## 2020-02-12 DIAGNOSIS — N1831 Chronic kidney disease, stage 3a: Secondary | ICD-10-CM

## 2020-02-12 DIAGNOSIS — I10 Essential (primary) hypertension: Secondary | ICD-10-CM

## 2020-02-12 DIAGNOSIS — I5032 Chronic diastolic (congestive) heart failure: Secondary | ICD-10-CM

## 2020-02-12 DIAGNOSIS — I4819 Other persistent atrial fibrillation: Secondary | ICD-10-CM

## 2020-02-12 DIAGNOSIS — G9340 Encephalopathy, unspecified: Secondary | ICD-10-CM

## 2020-02-12 LAB — BASIC METABOLIC PANEL
Anion gap: 11 (ref 5–15)
BUN: 26 mg/dL — ABNORMAL HIGH (ref 8–23)
CO2: 23 mmol/L (ref 22–32)
Calcium: 8.8 mg/dL — ABNORMAL LOW (ref 8.9–10.3)
Chloride: 107 mmol/L (ref 98–111)
Creatinine, Ser: 1.4 mg/dL — ABNORMAL HIGH (ref 0.44–1.00)
GFR calc Af Amer: 40 mL/min — ABNORMAL LOW (ref >60)
GFR calc non Af Amer: 34 mL/min — ABNORMAL LOW (ref >60)
Glucose, Bld: 98 mg/dL (ref 70–99)
Potassium: 4.2 mmol/L (ref 3.5–5.1)
Sodium: 141 mmol/L (ref 135–145)

## 2020-02-12 LAB — CBC
HCT: 30.4 % — ABNORMAL LOW (ref 36.0–46.0)
Hemoglobin: 9.8 g/dL — ABNORMAL LOW (ref 12.0–15.0)
MCH: 31.9 pg (ref 26.0–34.0)
MCHC: 32.2 g/dL (ref 30.0–36.0)
MCV: 99 fL (ref 80.0–100.0)
Platelets: 260 10*3/uL (ref 150–400)
RBC: 3.07 MIL/uL — ABNORMAL LOW (ref 3.87–5.11)
RDW: 13.5 % (ref 11.5–15.5)
WBC: 7.4 10*3/uL (ref 4.0–10.5)
nRBC: 0 % (ref 0.0–0.2)

## 2020-02-12 LAB — URINE CULTURE: Culture: <10000 — AB

## 2020-02-12 LAB — TSH: TSH: 2.62 u[IU]/mL (ref 0.350–4.500)

## 2020-02-12 LAB — ECHOCARDIOGRAM LIMITED
Height: 67 in
Weight: 2765.45 oz

## 2020-02-12 LAB — SARS CORONAVIRUS 2 (TAT 6-24 HRS): SARS Coronavirus 2: NEGATIVE

## 2020-02-12 LAB — LACTIC ACID, PLASMA: Lactic Acid, Venous: 0.8 mmol/L (ref 0.5–1.9)

## 2020-02-12 MED ORDER — ALBUTEROL SULFATE (2.5 MG/3ML) 0.083% IN NEBU: 2.5000 mg | INHALATION_SOLUTION | RESPIRATORY_TRACT | Status: DC | PRN

## 2020-02-12 NOTE — ED Notes (Signed)
Report given to [place], Therapist, sports. All questions answered

## 2020-02-12 NOTE — Progress Notes (Signed)
  Echocardiogram 2D Echocardiogram has been performed.  Christina Morgan 02/12/2020, 9:50 AM

## 2020-02-12 NOTE — TOC Initial Note (Signed)
Transition of Care [facility]) - Initial/Assessment Note    Patient Details  Name: Christina Morgan MRN: [id number] Date of Birth: 09/12/1933  Transition of Care [facility]) CM/SW Contact:    Geralynn Ochs, LCSW Phone Number: 02/12/2020, 3:32 PM  Clinical Narrative:     CSW met with patient and daughter at bedside to discuss SNF recommendation. Family in agreement, are aware that patient is not in a state to return to ALF at this time. Daughter is hopeful for [facility] in [place] as the [place] close to [place] area, she is unfamiliar with other choices. CSW discussed insurance coverage and expectations with daughter, and daughter appreciative of information. CSW to fax out referral and follow up with [facility] to see if they can take her.  UPDATE: CSW received call from [facility] that they can accept patient. CSW contacted daughter to update her on bed offer. CSW to follow.   Expected Discharge Plan: Skilled Nursing Facility Barriers to Discharge: Continued Medical Work up   Patient Goals and CMS Choice Patient states their goals for this hospitalization and ongoing recovery are:: patient unable to participate in goal setting due to confusion CMS Medicare.gov Compare Post Acute Care list provided to:: Patient Represenative (must comment) Choice offered to / list presented to : Adult Children  Expected Discharge Plan and Services Expected Discharge Plan: [place] Choice: [place] Living arrangements for the past 2 months: [place]                                      Prior Living Arrangements/Services Living arrangements for the past 2 months: [place] Lives with:: Facility Resident Patient language and need for interpreter reviewed:: No Do you feel safe going back to the place where you live?: Yes      Need for Family Participation in Patient Care: Yes (Comment) Care giver support system in place?: No  (comment) Current home services: DME Criminal Activity/Legal Involvement Pertinent to Current Situation/Hospitalization: No - Comment as needed  Activities of Daily Living      Permission Sought/Granted Permission sought to share information with : Facility Sport and exercise psychologist, Family Supports Permission granted to share information with : Yes, Verbal Permission Granted  Share Information with NAME: Gwinda Passe  Permission granted to share info w AGENCY: [facility]  Permission granted to share info w Relationship: Daughter     Emotional Assessment Appearance:: Appears stated age Attitude/Demeanor/Rapport: Unable to Assess Affect (typically observed): Unable to Assess Orientation: : Oriented to Self Alcohol / Substance Use: Not Applicable Psych Involvement: No (comment)  Admission diagnosis:  Acute cystitis without hematuria [N30.00] Acute encephalopathy [G93.40] Altered mental status, unspecified altered mental status type [R41.82] Patient Active Problem List   Diagnosis Date Noted  . Acute encephalopathy 02/11/2020  . Cervical spine fracture ([place]) 02/11/2020  . CAP (community acquired pneumonia) 02/11/2020  . Chronic diastolic congestive heart failure ([place])   . Stage 3 chronic kidney disease   . PAF (paroxysmal atrial fibrillation) ([place])   . Closed displaced intertrochanteric fracture of right femur ([place])   . Fall 09/23/2019  . SOB (shortness of breath) 01/04/2019  . Bilateral lower leg cellulitis 03/30/2018  . Chronic diastolic heart failure ([place]) 03/30/2018  . Edema of extremities 08/19/2014  . Essential hypertension, benign 05/15/2014  . Mixed hyperlipidemia 05/15/2014  . Persistent atrial fibrillation ([place]) 05/15/2014  . Endometrial  ca [facility]) 12/28/2012   PCP:  Sueanne Margarita, MD Pharmacy:   [facility] [place], [place] - [address] [place] [address] Phone: [phone] Fax: [phone]     Social Determinants of Health (SDOH)  Interventions    Readmission Risk Interventions No flowsheet data found.

## 2020-02-12 NOTE — ED Notes (Signed)
Pt transferred to 3WRm 6 in NAD with all belongings on cart with this RN . Pt A&Ox2, speaking in full sentences. Breathing easy, non-labored. Equal rise and fall of chest noted. VSS

## 2020-02-12 NOTE — Progress Notes (Signed)
Progress Note    Christina Morgan  JKK:[id number] DOB: [birth year]  DOA: 02/11/2020 PCP: Sueanne Margarita, MD      Brief Narrative:    Medical records reviewed and are as summarized below:  Christina Morgan is an 86 y.o. female with PMH of endometrial cancer, chronic diastolic CHF, CKD stage IIIa, dyslipidemia, hypertension, paroxysmal A. fib presenting for evaluation of acute encephalopathy. Patient presented via EMS from assisted living facility [facility]). Per ED report, staff at ALF reporting that patient has been hallucinating about her husband who passed away several years ago. Per daughter, patient only receives outside help about 3x per week and is otherwise independent. The daughter began to notice confusion the day before admission including not knowing where she is at, reaching for inanimate objects and talking as if her late husband is alive. Telephone encounter to her cardiologist on the day of admission from her home health nurse noting that the patient has had worsening peripheral edema and 24 pound weight gain in the last 4 days.  Of note, patient had a fall on 01/29/2020 and was seen in the ED and noted to have a left orbital floor fracture and C-spine fracture. She has followed up with ENT outpatient but not yet with neurosurgery (this was scheduled for 02/13/2020).      Assessment/Plan:   Principal Problem:   Acute encephalopathy Active Problems:   Endometrial ca [facility])   Essential hypertension, benign   Mixed hyperlipidemia   Persistent atrial fibrillation (HCC)   Edema of extremities   Chronic diastolic heart failure (HCC)   Stage 3 chronic kidney disease   Cervical spine fracture (HCC)   CAP (community acquired pneumonia)   Acute Encephalopathy (possible CAP, possible UTI, possible sequela from fall) - Unclear etiology; likely multifactorial - Review of home meds does not reveal likely cause - TSH normal Continue empiric IV antibiotics for  possible UTI and community-acquired pneumonia. - F/u UDS and urine culture - CT Head stable  Monitor on telemetry.  2D echo is pending. PT evaluation.  - patient with recent c-spine fracture and wearing c-collar; conservative management per last Neurosurgery note when in ED after fall but has not yet followed up.  Patient can follow-up as an outpatient after discharge from the hospital.   Chronic diastolic HF - In Oct 7169: 2D echo with normal LVF and diastolic dysfunction - BNP mildly elevated from prior to 329 on admission  - Per last Cards visit November 2020: had been on Torsemide 40mg  BID but got volume depleted and had dizziness with what sounds like a syncopal episode resulting in hip fx>now on Torsemide 10mg  daily - still has LE edema but much improved on thigh high compression hose - cont Torsemide 10 mg daily - S/p 40 mg IV Lasix in ED on 02/11/2020 with good UOP - Repeat Echo Is pending - Cr stable  - <2gm Na diet  Persistent atrial fibrillation - NSR currently  - continue PTA: Atenolol25mg  BID, Flecainide 100mg  BID and Apixaban   HTN - at goal for age on admission  - continue Atenolol25mg  BID and Hydralazine25mg  BID  CKD stage 3a -creatinine stable at 1.55 on admission - Daily CMP  Endometrial CA - holding Megace and Tamoxifen currently during admission    Body mass index is 27.07 kg/m.   Family Communication/Anticipated D/C date and plan/Code Status   DVT prophylaxis: Eliquis Code Status: Full code Family Communication: Plan discussed with her daughter, Krystie, at the bedside.  Disposition Plan: Patient is from assisted living facility and plan is to discharge her back to assisted living facility.  She will be discharged when her mental status improves back to baseline.      Subjective:   Patient has no complaints.  Her daughter, Christina Morgan, is at the bedside and she reports that patient is still confused.  Objective:    Vitals:   02/12/20  0041 02/12/20 0109 02/12/20 0330 02/12/20 0731  BP: (!) 152/53 (!) 144/53 (!) 133/51 (!) 134/41  Pulse: 67 64 64 (!) 58  Resp: 18 20 18 15   Temp:  (!) 97.5 F (36.4 C) 97.8 F (36.6 C) 97.7 F (36.5 C)  TempSrc:  Oral Oral Oral  SpO2: 100% 98% 98% 98%  Weight:  78.4 kg    Height:  5\' 7"  (1.702 m)      Intake/Output Summary (Last 24 hours) at 02/12/2020 1242 Last data filed at 02/12/2020 1000 Gross per 24 hour  Intake 240 ml  Output --  Net 240 ml   Filed Weights   02/12/20 0109  Weight: 78.4 kg    Exam:  GEN: NAD SKIN: No rash EYES: EOMI ENT: MMM, cervical collar in place CV: RRR PULM: CTA B ABD: soft, ND, NT, +BS CNS: AAO x 1 (person), confused, non focal EXT: No edema or tenderness   Data Reviewed:   I have personally reviewed following labs and imaging studies:  Labs: Labs show the following:   Basic Metabolic Panel: Recent Labs  Lab 02/11/20 1506 02/12/20 0545  NA 140 141  K 4.6 4.2  CL 107 107  CO2 25 23  GLUCOSE 111* 98  BUN 31* 26*  CREATININE 1.55* 1.40*  CALCIUM 8.9 8.8*   GFR Estimated Creatinine Clearance: 32.3 mL/min (A) (by C-G formula based on SCr of 1.4 mg/dL (H)). Liver Function Tests: Recent Labs  Lab 02/11/20 1506  AST 17  ALT 15  ALKPHOS 75  BILITOT 0.6  PROT 6.1*  ALBUMIN 3.1*   No results for input(s): LIPASE, AMYLASE in the last 168 hours. No results for input(s): AMMONIA in the last 168 hours. Coagulation profile No results for input(s): INR, PROTIME in the last 168 hours.  CBC: Recent Labs  Lab 02/11/20 1506 02/12/20 0545  WBC 8.5 7.4  NEUTROABS 6.3  --   HGB 10.7* 9.8*  HCT 33.9* 30.4*  MCV 102.1* 99.0  PLT 267 260   Cardiac Enzymes: No results for input(s): CKTOTAL, CKMB, CKMBINDEX, TROPONINI in the last 168 hours. BNP (last 3 results) No results for input(s): PROBNP in the last 8760 hours. CBG: Recent Labs  Lab 02/11/20 1541  GLUCAP 93   D-Dimer: No results for input(s): DDIMER in the last 72  hours. Hgb A1c: No results for input(s): HGBA1C in the last 72 hours. Lipid Profile: No results for input(s): CHOL, HDL, LDLCALC, TRIG, CHOLHDL, LDLDIRECT in the last 72 hours. Thyroid function studies: Recent Labs    02/12/20 0545  TSH 2.620   Anemia work up: No results for input(s): VITAMINB12, FOLATE, FERRITIN, TIBC, IRON, RETICCTPCT in the last 72 hours. Sepsis Labs: Recent Labs  Lab 02/11/20 1506 02/11/20 2018 02/12/20 0545  WBC 8.5  --  7.4  LATICACIDVEN  --  0.9 0.8    Microbiology Recent Results (from the past 240 hour(s))  SARS CORONAVIRUS 2 (TAT 6-24 HRS) Nasopharyngeal Nasopharyngeal Swab     Status: None   Collection Time: 02/11/20  8:18 PM   Specimen: Nasopharyngeal Swab  Result Value Ref  Range Status   SARS Coronavirus 2 NEGATIVE NEGATIVE Final    Comment: (NOTE) SARS-CoV-2 target nucleic acids are NOT DETECTED. The SARS-CoV-2 RNA is generally detectable in upper and lower respiratory specimens during the acute phase of infection. Negative results do not preclude SARS-CoV-2 infection, do not rule out co-infections with other pathogens, and should not be used as the sole basis for treatment or other patient management decisions. Negative results must be combined with clinical observations, patient history, and epidemiological information. The expected result is Negative. Fact Sheet for Patients: SugarRoll.be Fact Sheet for Healthcare Providers: https://www.woods-mathews.com/ This test is not yet approved or cleared by the [place] FDA and  has been authorized for detection and/or diagnosis of SARS-CoV-2 by FDA under an Emergency Use Authorization (EUA). This EUA will remain  in effect (meaning this test can be used) for the duration of the COVID-19 declaration under Section 56 4(b)(1) of the Act, 21 U.S.C. section 360bbb-3(b)(1), unless the authorization is terminated or revoked sooner. Performed at [facility], [place] [address], [place], [place] [address]   Blood culture (routine x 2)     Status: None (Preliminary result)   Collection Time: 02/11/20  8:18 PM   Specimen: BLOOD RIGHT ARM  Result Value Ref Range Status   Specimen Description BLOOD RIGHT ARM  Final   Special Requests   Final    BOTTLES DRAWN AEROBIC AND ANAEROBIC Blood Culture adequate volume   Culture   Final    NO GROWTH < 24 HOURS Performed at [facility], [place] [address], [place], [place] [address]    Report Status PENDING  Incomplete  Blood culture (routine x 2)     Status: None (Preliminary result)   Collection Time: 02/11/20  8:18 PM   Specimen: BLOOD LEFT ARM  Result Value Ref Range Status   Specimen Description BLOOD LEFT ARM  Final   Special Requests   Final    BOTTLES DRAWN AEROBIC AND ANAEROBIC Blood Culture adequate volume   Culture   Final    NO GROWTH < 24 HOURS Performed at [facility], [place] [address], [place], [place] [address]    Report Status PENDING  Incomplete    Procedures and diagnostic studies:  CT Head Wo Contrast  Result Date: 02/11/2020 CLINICAL DATA:  86 year old female with altered mental status. EXAM: CT HEAD WITHOUT CONTRAST TECHNIQUE: Contiguous axial images were obtained from the base of the skull through the vertex without intravenous contrast. COMPARISON:  Head CT dated 01/29/2020. FINDINGS: Brain: There is mild age-related atrophy and chronic microvascular ischemic changes. There is no acute intracranial hemorrhage. No midline shift. No extra-axial fluid collection. Stable calcified meningioma measuring approximately 2.6 x 1.7 cm from the inner table of the left frontotemporal calvarium. Vascular: No hyperdense vessel or unexpected calcification. Skull: Normal. Negative for fracture or focal lesion. Sinuses/Orbits: There is mucoperiosteal thickening of the left maxillary sinus. Old left orbital floor fracture as well as old fracture of the left nasal bone. No air-fluid  level. The mastoid air cells are clear. Other: None IMPRESSION: 1. No acute intracranial pathology. 2. Age-related atrophy and chronic microvascular ischemic changes. 3. Stable left frontotemporal calcified meningioma. Electronically Signed   By: Anner Crete M.D.   On: 02/11/2020 16:09   DG Chest Portable 1 View  Result Date: 02/11/2020 CLINICAL DATA:  Loss consciousness EXAM: PORTABLE CHEST 1 VIEW COMPARISON:  09/29/2019 FINDINGS: There is lingular airspace disease which may left reflect atelectasis versus pneumonia.  There is no pleural effusion or pneumothorax. The heart and mediastinal contours are unremarkable. There is thoracic aortic atherosclerosis. There is no acute osseous abnormality. IMPRESSION: Lingular airspace disease which may left reflect atelectasis versus pneumonia. Electronically Signed   By: Kathreen Devoid   On: 02/11/2020 15:28    Medications:    apixaban  2.5 mg Oral BID   atenolol  25 mg Oral BID   atorvastatin  10 mg Oral QHS   azithromycin  500 mg Oral Daily   docusate sodium  100 mg Oral BID   flecainide  100 mg Oral BID   hydrALAZINE  25 mg Oral BID   torsemide  10 mg Oral Daily   Continuous Infusions:  cefTRIAXone (ROCEPHIN)  IV       LOS: 1 day   Kane Kusek  [facility]     02/12/2020, 12:42 PM

## 2020-02-12 NOTE — Evaluation (Signed)
Physical Therapy Evaluation Patient Details Name: Christina Morgan MRN: [id number] DOB: 28-Dec-1932 Today's Date: 02/12/2020   History of Present Illness  Pt is an 86 y/o female admitted secondary to hallucinations and AMS. Feel likely secondary to PNA and UTI. Pt with recent fall with C spine fx. PMH includes HTN, a fib, CKD, dCHF, and endomentrial cancer.   Clinical Impression  Pt admitted secondary to problem above with deficits below. Pt requiring mod A +2 to take side steps at EOB. Pt reports RLE weakness and difficulty taking steps with RLE. Per daughter, pt unable to ambulate as she was before and ALF only provides minimal assist. Feel she will benefit from SNF level therapies prior to return to ALF. Will continue to follow acutely to maximize functional mobility independence and safety.   OF NOTE: Per pt's daughter, since falling and sustaining C-spine fx, pt with new bowel incontinence and LE weakness. RN notified.     Follow Up Recommendations SNF;Supervision/Assistance - 24 hour    Equipment Recommendations  None recommended by PT    Recommendations for Other Services       Precautions / Restrictions Precautions Precautions: Fall;Cervical Precaution Booklet Issued: No Precaution Comments: Verbally reviewed cervical precautions.  Required Braces or Orthoses: Cervical Brace Cervical Brace: Hard collar Restrictions Weight Bearing Restrictions: No      Mobility  Bed Mobility Overal bed mobility: Needs Assistance Bed Mobility: Rolling;Sidelying to Sit;Sit to Sidelying Rolling: Mod assist Sidelying to sit: Mod assist     Sit to sidelying: Mod assist General bed mobility comments: Mod A for assist with rolling to side and required assist for trunk and LEs throughout. Cues for log roll technique. Mod A for rolling side to side for clean up secondary to bowel incontinence.   Transfers Overall transfer level: Needs assistance Equipment used: 2 person hand held  assist Transfers: Sit to/from Stand Sit to Stand: Min assist;+2 physical assistance         General transfer comment: Min A +2 for lift assist and steadying.   Ambulation/Gait Ambulation/Gait assistance: Mod assist;+2 physical assistance   Assistive device: 2 person hand held assist       General Gait Details: Mod +2 to take side steps at EOB. Pt with difficulty taking steps with RLE. Pt reports weakness in RLE as well. Mod A +2 for steadying.   Stairs            Wheelchair Mobility    Modified Rankin (Stroke Patients Only)       Balance Overall balance assessment: Needs assistance Sitting-balance support: Feet supported;No upper extremity supported Sitting balance-Leahy Scale: Fair     Standing balance support: Bilateral upper extremity supported;During functional activity Standing balance-Leahy Scale: Poor Standing balance comment: Reliant on BUE and external support.                              Pertinent Vitals/Pain Pain Assessment: Faces Faces Pain Scale: Hurts a little bit Pain Location: neck  Pain Descriptors / Indicators: Aching;Guarding;Grimacing Pain Intervention(s): Limited activity within patient's tolerance;Monitored during session;Repositioned    Home Living Family/patient expects to be discharged to:: Assisted living               Home Equipment: Shower seat;Bedside commode;Walker - 2 wheels;Transport chair Additional Comments: Pt daughter reports she is from ALF     Prior Function Level of Independence: Needs assistance   Gait / Transfers Assistance Needed: Per daughter, prior  to fall, pt was mod I with RW. Staff would sometimes be present to walk with her to the bathroom. Since falling, has been unable to stand.   ADL's / Homemaking Assistance Needed: aide sponge bathing pt 2 x week recently because of leg wraps but used to sit on chair in tub shower        Hand Dominance        Extremity/Trunk Assessment    Upper Extremity Assessment Upper Extremity Assessment: Generalized weakness    Lower Extremity Assessment Lower Extremity Assessment: Generalized weakness;RLE deficits/detail RLE Deficits / Details: Pt reports she is unable to lift RLE; noted when trying to take side steps at EOB.     Cervical / Trunk Assessment Cervical / Trunk Assessment: Other exceptions Cervical / Trunk Exceptions: Recent C-Spine fx.   Communication   Communication: No difficulties  Cognition Arousal/Alertness: Awake/alert Behavior During Therapy: WFL for tasks assessed/performed Overall Cognitive Status: Impaired/Different from baseline Area of Impairment: Orientation;Memory;Problem solving                 Orientation Level: Disoriented to;Place   Memory: Decreased short-term memory;Decreased recall of precautions       Problem Solving: Slow processing General Comments: Pt reporting initially she was in a hospital in [place]. Also reports she was born in 1930, but was actually born in [birth year]. Slowed processing noted with some confusion.       General Comments      Exercises     Assessment/Plan    PT Assessment Patient needs continued PT services  PT Problem List Decreased strength;Decreased balance;Decreased mobility;Decreased cognition;Decreased knowledge of use of DME;Decreased safety awareness;Decreased knowledge of precautions       PT Treatment Interventions Gait training;Functional mobility training;Therapeutic activities;Therapeutic exercise;Balance training;Patient/family education;Cognitive remediation    PT Goals (Current goals can be found in the Care Plan section)  Acute Rehab PT Goals Patient Stated Goal: to get stronger PT Goal Formulation: With patient Time For Goal Achievement: 02/26/20 Potential to Achieve Goals: Good    Frequency Min 2X/week   Barriers to discharge Decreased caregiver support      Co-evaluation               AM-PAC PT "6 Clicks" Mobility   Outcome Measure Help needed turning from your back to your side while in a flat bed without using bedrails?: A Lot Help needed moving from lying on your back to sitting on the side of a flat bed without using bedrails?: A Lot Help needed moving to and from a bed to a chair (including a wheelchair)?: A Lot Help needed standing up from a chair using your arms (e.g., wheelchair or bedside chair)?: A Lot Help needed to walk in hospital room?: Total Help needed climbing 3-5 steps with a railing? : Total 6 Click Score: 10    End of Session Equipment Utilized During Treatment: Gait belt Activity Tolerance: Patient tolerated treatment well Patient left: in bed;with call bell/phone within reach;with bed alarm set;with family/visitor present;with nursing/sitter in room Nurse Communication: Mobility status PT Visit Diagnosis: Unsteadiness on feet (R26.81);History of falling (Z91.81);Muscle weakness (generalized) (M62.81)    Time: 3664-4034 PT Time Calculation (min) (ACUTE ONLY): 30 min   Charges:   PT Evaluation $PT Eval Moderate Complexity: 1 Mod PT Treatments $Therapeutic Activity: 8-22 mins        Lou Miner, DPT  Acute Rehabilitation Services  Pager: [phone] Office: [phone]   Rudean Hitt 02/12/2020, 1:41 PM

## 2020-02-12 NOTE — NC FL2 (Signed)
[place] LEVEL OF CARE SCREENING TOOL     IDENTIFICATION  Patient Name: Christina Morgan Birthdate: [birth year] Sex: female Admission Date (Current Location): 02/11/2020  [facility] and [place] Number:  Herbalist and Address:  [facility], [place] [address], [place], [place] [address]      Provider Number: [id number]  Attending Physician Name and Address:  Jennye Boroughs, MD  Relative Name and Phone Number:       Current Level of Care: Hospital Recommended Level of Care: [place] Prior Approval Number:    Date Approved/Denied:   PASRR Number: [id number]  Discharge Plan: SNF    Current Diagnoses: Patient Active Problem List   Diagnosis Date Noted  . Acute encephalopathy 02/11/2020  . Cervical spine fracture ([place]) 02/11/2020  . CAP (community acquired pneumonia) 02/11/2020  . Chronic diastolic congestive heart failure ([place])   . Stage 3 chronic kidney disease   . PAF (paroxysmal atrial fibrillation) ([place])   . Closed displaced intertrochanteric fracture of right femur ([place])   . Fall 09/23/2019  . SOB (shortness of breath) 01/04/2019  . Bilateral lower leg cellulitis 03/30/2018  . Chronic diastolic heart failure ([place]) 03/30/2018  . Edema of extremities 08/19/2014  . Essential hypertension, benign 05/15/2014  . Mixed hyperlipidemia 05/15/2014  . Persistent atrial fibrillation ([place]) 05/15/2014  . Endometrial ca ([place]) 12/28/2012    Orientation RESPIRATION BLADDER Height & Weight     Self  Normal Incontinent Weight: 172 lb 13.5 oz (78.4 kg) Height:  5\' 7"  (170.2 cm)  BEHAVIORAL SYMPTOMS/MOOD NEUROLOGICAL BOWEL NUTRITION STATUS      Continent Diet(heart healthy)  AMBULATORY STATUS COMMUNICATION OF NEEDS Skin   Extensive Assist Verbally Normal                       Personal Care Assistance Level of Assistance  Bathing, Feeding, Dressing Bathing Assistance: Maximum assistance Feeding assistance:  Limited assistance Dressing Assistance: Maximum assistance     Functional Limitations Info             SPECIAL CARE FACTORS FREQUENCY  PT (By licensed PT), OT (By licensed OT)     PT Frequency: 5x/wk OT Frequency: 5x/wk            Contractures Contractures Info: Not present    Additional Factors Info  Code Status, Allergies Code Status Info: Full Allergies Info: Penicillins           Current Medications (02/12/2020):  This is the current hospital active medication list Current Facility-Administered Medications  Medication Dose Route Frequency Provider Last Rate Last Admin  . acetaminophen (TYLENOL) tablet 325-650 mg  325-650 mg Oral Q6H PRN Fair, Chelsea N, MD      . albuterol (PROVENTIL) (2.5 MG/3ML) 0.083% nebulizer solution 2.5 mg  2.5 mg Nebulization Q4H PRN Fair, Marin Shutter, MD      . apixaban (ELIQUIS) tablet 2.5 mg  2.5 mg Oral BID Fair, Chelsea N, MD   2.5 mg at 02/12/20 1030  . atenolol (TENORMIN) tablet 25 mg  25 mg Oral BID Fair, Marin Shutter, MD   25 mg at 02/12/20 1030  . atorvastatin (LIPITOR) tablet 10 mg  10 mg Oral QHS Fair, Marin Shutter, MD   10 mg at 02/11/20 2331  . azithromycin (ZITHROMAX) tablet 500 mg  500 mg Oral Daily Fair, Chelsea N, MD      . cefTRIAXone (ROCEPHIN) 1 g in sodium chloride 0.9 % 100 mL  IVPB  1 g Intravenous Q24H Fair, Chelsea N, MD      . docusate sodium (COLACE) capsule 100 mg  100 mg Oral BID Chauncey Mann, MD   100 mg at 02/12/20 1028  . flecainide (TAMBOCOR) tablet 100 mg  100 mg Oral BID Chauncey Mann, MD   100 mg at 02/12/20 1030  . hydrALAZINE (APRESOLINE) tablet 25 mg  25 mg Oral BID Chauncey Mann, MD   25 mg at 02/12/20 0754  . torsemide (DEMADEX) tablet 10 mg  10 mg Oral Daily Fair, Marin Shutter, MD   10 mg at 02/12/20 1030     Discharge Medications: Please see discharge summary for a list of discharge medications.  Relevant Imaging Results:  Relevant Lab Results:   Additional Information SS#:  [id number]  Geralynn Ochs, LCSW

## 2020-02-13 DIAGNOSIS — R7881 Bacteremia: Secondary | ICD-10-CM

## 2020-02-13 LAB — CBC
HCT: 29.1 % — ABNORMAL LOW (ref 36.0–46.0)
Hemoglobin: 9.2 g/dL — ABNORMAL LOW (ref 12.0–15.0)
MCH: 31.9 pg (ref 26.0–34.0)
MCHC: 31.6 g/dL (ref 30.0–36.0)
MCV: 101 fL — ABNORMAL HIGH (ref 80.0–100.0)
Platelets: 265 10*3/uL (ref 150–400)
RBC: 2.88 MIL/uL — ABNORMAL LOW (ref 3.87–5.11)
RDW: 13.7 % (ref 11.5–15.5)
WBC: 6.9 10*3/uL (ref 4.0–10.5)
nRBC: 0 % (ref 0.0–0.2)

## 2020-02-13 LAB — VITAMIN B12: Vitamin B-12: 213 pg/mL (ref 180–914)

## 2020-02-13 LAB — BASIC METABOLIC PANEL
Anion gap: 11 (ref 5–15)
BUN: 27 mg/dL — ABNORMAL HIGH (ref 8–23)
CO2: 24 mmol/L (ref 22–32)
Calcium: 8.6 mg/dL — ABNORMAL LOW (ref 8.9–10.3)
Chloride: 109 mmol/L (ref 98–111)
Creatinine, Ser: 1.5 mg/dL — ABNORMAL HIGH (ref 0.44–1.00)
GFR calc Af Amer: 37 mL/min — ABNORMAL LOW (ref >60)
GFR calc non Af Amer: 32 mL/min — ABNORMAL LOW (ref >60)
Glucose, Bld: 103 mg/dL — ABNORMAL HIGH (ref 70–99)
Potassium: 4.3 mmol/L (ref 3.5–5.1)
Sodium: 144 mmol/L (ref 135–145)

## 2020-02-13 LAB — IRON AND TIBC
Iron: 31 ug/dL (ref 28–170)
Saturation Ratios: 14 % (ref 10.4–31.8)
TIBC: 217 ug/dL — ABNORMAL LOW (ref 250–450)
UIBC: 186 ug/dL

## 2020-02-13 LAB — RETICULOCYTES
Immature Retic Fract: 3.2 % (ref 2.3–15.9)
RBC.: 3.02 MIL/uL — ABNORMAL LOW (ref 3.87–5.11)
Retic Count, Absolute: 31.5 10*3/uL (ref 19.0–186.0)
Retic Ct Pct: 1.7 % (ref 0.4–3.1)

## 2020-02-13 LAB — FOLATE: Folate: 14.2 ng/mL (ref >5.9)

## 2020-02-13 LAB — FERRITIN: Ferritin: 360 ng/mL — ABNORMAL HIGH (ref 11–307)

## 2020-02-13 MED ORDER — SODIUM CHLORIDE 0.9 % IV SOLN
1.0000 g | Freq: Two times a day (BID) | INTRAVENOUS | Status: DC
  Administered 2020-02-13 – 2020-02-15 (×4): 1 g via INTRAVENOUS
  Filled 2020-02-13 (×5): qty 1

## 2020-02-13 NOTE — Progress Notes (Signed)
Removed pts coban wraps from bl lower ext.  Edema much improved. Will leave off for now per pt request. Also notified MD of Tele order, expired.  Pt much more alert and oriented at end of this shift.

## 2020-02-13 NOTE — Progress Notes (Signed)
PROGRESS NOTE    Christina Morgan  WLN:[id number] DOB: [birth year] DOA: 02/11/2020 PCP: Sueanne Margarita, MD     Brief Narrative:  86 year old woman admitted from home on 3/2 with complaints of altered mental status.  Her past medical history significant for endometrial cancer, chronic diastolic heart failure, chronic kidney disease, hyperlipidemia, hypertension, paroxysmal atrial fibrillation.  She lives in [place].  Per report she had been hallucinating about her husband who passed away several years ago.  Admission was requested for further evaluation and management.   Assessment & Plan:   Principal Problem:   Acute encephalopathy Active Problems:   Endometrial ca [facility])   Essential hypertension, benign   Mixed hyperlipidemia   Persistent atrial fibrillation (HCC)   Edema of extremities   Chronic diastolic heart failure (HCC)   Stage 3 chronic kidney disease   Cervical spine fracture (HCC)   CAP (community acquired pneumonia)   Acute metabolic encephalopathy -CT of the head negative. -Chest x-ray with lingular disease possibly pneumonia versus atelectasis. -No great electrolyte abnormalities that could explain presentation. -Blood cultures on Gram stain are positive for gram-positive rods. -After discussion with pharmacist, will DC Rocephin and transition over to meropenem pending final culture data.  Gram-positive rod bacteremia -As above, placed on meropenem pending final culture data.  Macrocytic anemia -Check vitamin B12 and folate.  Chronic kidney disease stage IIIa -Baseline creatinine between 1.3 and 1.5. -Currently at baseline.  Persistent atrial fibrillation -Rate controlled. -Anticoagulated on Eliquis.  Chronic diastolic heart failure -Stable, compensated. -Continue beta-blocker.  Hypertension -Well-controlled while hospitalized  Endometrial cancer -Continue outpatient follow-up.  Hyperlipidemia -On atorvastatin.  Recent C-spine  fracture -She is wearing c-collar, will need to reschedule outpatient follow-up with neurosurgery due to hospitalization.    DVT prophylaxis: Fully anticoagulated on Eliquis Code Status: Full code Family Communication: Patient only Disposition Plan: Back to independent living pending final blood culture data and finalization of antibiotic treatment.  Consultants:   None  Procedures:   2D echo with ejection fraction of 60 to 65% with grade 2 diastolic dysfunction and no wall motion abnormalities.  Antimicrobials:  Anti-infectives (From admission, onward)   Start     Dose/Rate Route Frequency Ordered Stop   02/13/20 1600  meropenem (MERREM) 1 g in sodium chloride 0.9 % 100 mL IVPB     1 g 200 mL/hr over 30 Minutes Intravenous Every 12 hours 02/13/20 1508     02/12/20 2200  cefTRIAXone (ROCEPHIN) 1 g in sodium chloride 0.9 % 100 mL IVPB  Status:  Discontinued     1 g 200 mL/hr over 30 Minutes Intravenous Every 24 hours 02/11/20 2333 02/13/20 1508   02/12/20 2100  cefTRIAXone (ROCEPHIN) 1 g in sodium chloride 0.9 % 100 mL IVPB  Status:  Discontinued     1 g 200 mL/hr over 30 Minutes Intravenous Every 24 hours 02/11/20 2222 02/11/20 2333   02/12/20 1800  azithromycin (ZITHROMAX) tablet 500 mg     500 mg Oral Daily 02/11/20 2222 02/13/20 1043   02/11/20 1900  cefTRIAXone (ROCEPHIN) 1 g in sodium chloride 0.9 % 100 mL IVPB     1 g 200 mL/hr over 30 Minutes Intravenous  Once 02/11/20 1856 02/11/20 2132   02/11/20 1900  azithromycin (ZITHROMAX) 500 mg in sodium chloride 0.9 % 250 mL IVPB     500 mg 250 mL/hr over 60 Minutes Intravenous  Once 02/11/20 1857 02/11/20 2238       Subjective: Lying in bed, awake fully  oriented, no complaints.  She is able to tell me why she is in the hospital: " They tell me I was talking crazy things out of my head".  Objective: Vitals:   02/12/20 2331 02/13/20 0333 02/13/20 0731 02/13/20 1041  BP: (!) 126/54  (!) 166/128 136/62  Pulse: 63  72     Resp: 18  (!) 23   Temp: 98.1 F (36.7 C) 98.6 F (37 C)    TempSrc: Oral Oral    SpO2: 97%  96%   Weight:      Height:        Intake/Output Summary (Last 24 hours) at 02/13/2020 1629 Last data filed at 02/13/2020 1343 Gross per 24 hour  Intake 690 ml  Output 450 ml  Net 240 ml   Filed Weights   02/12/20 0109  Weight: 78.4 kg    Examination:  General exam: Alert, awake, oriented x 3 Respiratory system: Clear to auscultation. Respiratory effort normal. Cardiovascular system:RRR. No murmurs, rubs, gallops. Gastrointestinal system: Abdomen is nondistended, soft and nontender. No organomegaly or masses felt. Normal bowel sounds heard. Central nervous system: Alert and oriented. No focal neurological deficits. Extremities: No C/C/E, +pedal pulses Skin: No rashes, lesions or ulcers Psychiatry: Judgement and insight appear normal. Mood & affect appropriate.     Data Reviewed: I have personally reviewed following labs and imaging studies  CBC: Recent Labs  Lab 02/11/20 1506 02/12/20 0545 02/13/20 0248  WBC 8.5 7.4 6.9  NEUTROABS 6.3  --   --   HGB 10.7* 9.8* 9.2*  HCT 33.9* 30.4* 29.1*  MCV 102.1* 99.0 101.0*  PLT 267 260 875   Basic Metabolic Panel: Recent Labs  Lab 02/11/20 1506 02/12/20 0545 02/13/20 0248  NA 140 141 144  K 4.6 4.2 4.3  CL 107 107 109  CO2 25 23 24   GLUCOSE 111* 98 103*  BUN 31* 26* 27*  CREATININE 1.55* 1.40* 1.50*  CALCIUM 8.9 8.8* 8.6*   GFR: Estimated Creatinine Clearance: 30.1 mL/min (A) (by C-G formula based on SCr of 1.5 mg/dL (H)). Liver Function Tests: Recent Labs  Lab 02/11/20 1506  AST 17  ALT 15  ALKPHOS 75  BILITOT 0.6  PROT 6.1*  ALBUMIN 3.1*   No results for input(s): LIPASE, AMYLASE in the last 168 hours. No results for input(s): AMMONIA in the last 168 hours. Coagulation Profile: No results for input(s): INR, PROTIME in the last 168 hours. Cardiac Enzymes: No results for input(s): CKTOTAL, CKMB, CKMBINDEX,  TROPONINI in the last 168 hours. BNP (last 3 results) No results for input(s): PROBNP in the last 8760 hours. HbA1C: No results for input(s): HGBA1C in the last 72 hours. CBG: Recent Labs  Lab 02/11/20 1541  GLUCAP 93   Lipid Profile: No results for input(s): CHOL, HDL, LDLCALC, TRIG, CHOLHDL, LDLDIRECT in the last 72 hours. Thyroid Function Tests: Recent Labs    02/12/20 0545  TSH 2.620   Anemia Panel: No results for input(s): VITAMINB12, FOLATE, FERRITIN, TIBC, IRON, RETICCTPCT in the last 72 hours. Urine analysis:    Component Value Date/Time   COLORURINE STRAW (A) 02/11/2020 1830   APPEARANCEUR CLEAR 02/11/2020 1830   LABSPEC 1.008 02/11/2020 1830   PHURINE 5.0 02/11/2020 1830   GLUCOSEU NEGATIVE 02/11/2020 1830   HGBUR NEGATIVE 02/11/2020 1830   BILIRUBINUR NEGATIVE 02/11/2020 1830   KETONESUR NEGATIVE 02/11/2020 1830   PROTEINUR NEGATIVE 02/11/2020 1830   UROBILINOGEN 0.2 06/19/2014 2131   NITRITE NEGATIVE 02/11/2020 1830   LEUKOCYTESUR SMALL (A)  02/11/2020 1830   Sepsis Labs: @LABRCNTIP (procalcitonin:4,lacticidven:4)  ) Recent Results (from the past 240 hour(s))  Urine culture     Status: Abnormal   Collection Time: 02/11/20  5:54 PM   Specimen: Urine, Random  Result Value Ref Range Status   Specimen Description URINE, RANDOM  Final   Special Requests NONE  Final   Culture (A)  Final    <10,000 COLONIES/mL INSIGNIFICANT GROWTH Performed at [facility], [place] [address] [place], [place] [address]    Report Status 02/12/2020 FINAL  Final  SARS CORONAVIRUS 2 (TAT 6-24 HRS) Nasopharyngeal Nasopharyngeal Swab     Status: None   Collection Time: 02/11/20  8:18 PM   Specimen: Nasopharyngeal Swab  Result Value Ref Range Status   SARS Coronavirus 2 NEGATIVE NEGATIVE Final    Comment: (NOTE) SARS-CoV-2 target nucleic acids are NOT DETECTED. The SARS-CoV-2 RNA is generally detectable in upper and lower respiratory specimens during the acute phase of  infection. Negative results do not preclude SARS-CoV-2 infection, do not rule out co-infections with other pathogens, and should not be used as the sole basis for treatment or other patient management decisions. Negative results must be combined with clinical observations, patient history, and epidemiological information. The expected result is Negative. Fact Sheet for Patients: SugarRoll.be Fact Sheet for Healthcare Providers: https://www.woods-mathews.com/ This test is not yet approved or cleared by the [place] FDA and  has been authorized for detection and/or diagnosis of SARS-CoV-2 by FDA under an Emergency Use Authorization (EUA). This EUA will remain  in effect (meaning this test can be used) for the duration of the COVID-19 declaration under Section 56 4(b)(1) of the Act, 21 U.S.C. section 360bbb-3(b)(1), unless the authorization is terminated or revoked sooner. Performed at [facility], [place] [address], [place], [place] [address]   Blood culture (routine x 2)     Status: None (Preliminary result)   Collection Time: 02/11/20  8:18 PM   Specimen: BLOOD RIGHT ARM  Result Value Ref Range Status   Specimen Description BLOOD RIGHT ARM  Final   Special Requests   Final    BOTTLES DRAWN AEROBIC AND ANAEROBIC Blood Culture adequate volume   Culture  Setup Time   Final    IN BOTH AEROBIC AND ANAEROBIC BOTTLES GRAM POSITIVE RODS CRITICAL RESULT CALLED TO, READ BACK BY AND VERIFIED WITH: K AMEND [facility] 02/13/20 0437 JDW    Culture   Final    NO GROWTH 2 DAYS Performed at [facility], [place] [address] [place], [place] [address]    Report Status PENDING  Incomplete  Blood culture (routine x 2)     Status: None (Preliminary result)   Collection Time: 02/11/20  8:18 PM   Specimen: BLOOD LEFT ARM  Result Value Ref Range Status   Specimen Description BLOOD LEFT ARM  Final   Special Requests   Final    BOTTLES DRAWN AEROBIC AND  ANAEROBIC Blood Culture adequate volume   Culture  Setup Time   Final    AEROBIC BOTTLE ONLY GRAM POSITIVE RODS CRITICAL VALUE NOTED.  VALUE IS CONSISTENT WITH PREVIOUSLY REPORTED AND CALLED VALUE.    Culture   Final    NO GROWTH 2 DAYS Performed at [facility], [place] [address], [place], [place] [address]    Report Status PENDING  Incomplete         Radiology Studies: ECHOCARDIOGRAM LIMITED  Result Date: 02/12/2020    ECHOCARDIOGRAM LIMITED REPORT  Patient Name:   Christina Morgan Date of Exam: 02/12/2020 Medical Rec #:  [id number]           Height:       67.0 in Accession #:    [id number]          Weight:       172.8 lb Date of Birth:  06/20/1933            BSA:          1.901 m Patient Age:    45 years            BP:           134/41 mmHg Patient Gender: F                   HR:           58 bpm. Exam Location:  Inpatient Procedure: 2D Echo Indications:    CHF 428  History:        Patient has prior history of Echocardiogram examinations, most                 recent 09/24/2019. CHF; Risk Factors:Hypertension and                 Dyslipidemia. PAF.  Sonographer:    Jannett Celestine RDCS (AE) Referring Phys: [id number] CHELSEA N FAIR  Sonographer Comments: Suboptimal apical window and suboptimal subcostal window. restricted mobility w neck brace IMPRESSIONS  1. Left ventricular ejection fraction, by estimation, is 60 to 65%. The left ventricle has normal function. The left ventricle has no regional wall motion abnormalities. Left ventricular diastolic parameters are consistent with Grade II diastolic dysfunction (pseudonormalization).  2. Left atrial size was mildly dilated.  3. The mitral valve is normal in structure and function. No evidence of mitral valve regurgitation. No evidence of mitral stenosis.  4. The aortic valve is tricuspid. Aortic valve regurgitation is mild. Mild to moderate aortic valve sclerosis/calcification is present, without any evidence of aortic stenosis.  5. Tricuspid  regurgitation signal is inadequate for assessing PA pressure.  6. The inferior vena cava is normal in size with greater than 50% respiratory variability, suggesting right atrial pressure of 3 mmHg.  7. Technically difficult study with poor acoustic windows. FINDINGS  Left Ventricle: Left ventricular ejection fraction, by estimation, is 60 to 65%. The left ventricle has normal function. The left ventricle has no regional wall motion abnormalities. Right Ventricle: Tricuspid regurgitation signal is inadequate for assessing PA pressure. Left Atrium: Left atrial size was mildly dilated. Right Atrium: Right atrial size was normal in size. Pericardium: There is no evidence of pericardial effusion. Mitral Valve: The mitral valve is normal in structure and function. Mild to moderate mitral annular calcification. No evidence of mitral valve stenosis. Tricuspid Valve: The tricuspid valve is normal in structure. Tricuspid valve regurgitation is not demonstrated. Aortic Valve: The aortic valve is tricuspid. Aortic valve regurgitation is mild. Mild to moderate aortic valve sclerosis/calcification is present, without any evidence of aortic stenosis. Pulmonic Valve: The pulmonic valve was normal in structure. Pulmonic valve regurgitation is trivial. Venous: The inferior vena cava is normal in size with greater than 50% respiratory variability, suggesting right atrial pressure of 3 mmHg. IAS/Shunts: No atrial level shunt detected by color flow Doppler. Loralie Champagne MD Electronically signed by Loralie Champagne MD Signature Date/Time: 02/12/2020/4:44:27 PM    Final         Scheduled Meds: . apixaban  2.5 mg Oral  BID  . atenolol  25 mg Oral BID  . atorvastatin  10 mg Oral QHS  . docusate sodium  100 mg Oral BID  . flecainide  100 mg Oral BID  . hydrALAZINE  25 mg Oral BID  . torsemide  10 mg Oral Daily   Continuous Infusions: . meropenem (MERREM) IV       LOS: 2 days    Time spent: 35 minutes. Greater than 50% of  this time was spent in direct contact with the patient, coordinating care and discussing relevant ongoing clinical issues.     Lelon Frohlich, MD [facility] Pager [phone]  If 7PM-7AM, please contact night-coverage [email or web address] Password [facility] 02/13/2020, 4:29 PM

## 2020-02-13 NOTE — Progress Notes (Signed)
PHARMACY - PHYSICIAN COMMUNICATION CRITICAL VALUE ALERT - BLOOD CULTURE IDENTIFICATION (BCID)  EMERITA BERKEMEIER is an 87 y.o. female who presented to [facility] on 02/11/2020 with a chief complaint of acute encephalopathy  Assessment:  Pt growing GPR in 2/4 blood culture bottles - nothing on BCID panel.  Name of physician (or Provider) Contacted: Dr. Sharlet Salina  Current antibiotics: Rocephin  Changes to prescribed antibiotics recommended:  Patient is on recommended antibiotics - No changes needed  No results found for this or any previous visit.  Sherlon Handing, PharmD, BCPS Please see amion for complete clinical pharmacist phone list 02/13/2020  4:46 AM

## 2020-02-13 NOTE — Progress Notes (Signed)
Pharmacy Antibiotic Note  Christina Morgan is a 86 y.o. female admitted on 02/11/2020 with AMS/encephalopathy.  Pharmacy has been consulted for Merrem dosing.  Pt was admitted from SNF for altered mental status. Lab called last PM with blood culture gram stain result of 2/4 bottles with GPR. Normally, these are usually contaminants corynebacterium. Lab was called this AM and a third bottle from the other set came back with the same thing. They will wait for more growth before they can do the MALDi-TOF for organism identification. D/w Dr. Jerilee Hoh, we will change her ceftriaxone to [name] for now since she has a PCN allergy. Her infectious parameters look good.  Plan: Dc ceftriaxone Merrem 1g IV q12 F/u with Maldi-tof  Height: 5\' 7"  (170.2 cm) Weight: 172 lb 13.5 oz (78.4 kg) IBW/kg (Calculated) : 61.6  Temp (24hrs), Avg:98.1 F (36.7 C), Min:97.8 F (36.6 C), Max:98.6 F (37 C)  Recent Labs  Lab 02/11/20 1506 02/11/20 2018 02/12/20 0545 02/13/20 0248  WBC 8.5  --  7.4 6.9  CREATININE 1.55*  --  1.40* 1.50*  LATICACIDVEN  --  0.9 0.8  --     Estimated Creatinine Clearance: 30.1 mL/min (A) (by C-G formula based on SCr of 1.5 mg/dL (H)).    Allergies  Allergen Reactions  . Penicillins Itching and Other (See Comments)    Dizziness to the patient of passing out- told by MD to "never take this again" Did it involve swelling of the face/tongue/throat, SOB, or low BP? No Did it involve sudden or severe rash/hives, skin peeling, or any reaction on the inside of your mouth or nose? No Did you need to seek medical attention at a hospital or doctor's office? No When did it last happen?"a long time ago" If all above answers are "NO", may proceed with cephalosporin use.     Antimicrobials this admission: 3/2 ceftriaxone>3/3 3/2 azith>>3/4 3/4 Merrem>>  Dose adjustments this admission:   Microbiology results: 3/2 blood>>3/4 bottles GPR 3/2 urine>>neg  Christina Morgan, PharmD,  [place], AAHIVP, CPP Infectious Disease Pharmacist 02/13/2020 3:16 PM

## 2020-02-14 ENCOUNTER — Ambulatory Visit: Payer: Medicare Other | Admitting: Cardiology

## 2020-02-14 ENCOUNTER — Other Ambulatory Visit (HOSPITAL_COMMUNITY): Payer: Medicare Other

## 2020-02-14 LAB — BASIC METABOLIC PANEL
Anion gap: 10 (ref 5–15)
BUN: 24 mg/dL — ABNORMAL HIGH (ref 8–23)
CO2: 24 mmol/L (ref 22–32)
Calcium: 8.4 mg/dL — ABNORMAL LOW (ref 8.9–10.3)
Chloride: 108 mmol/L (ref 98–111)
Creatinine, Ser: 1.5 mg/dL — ABNORMAL HIGH (ref 0.44–1.00)
GFR calc Af Amer: 37 mL/min — ABNORMAL LOW (ref >60)
GFR calc non Af Amer: 32 mL/min — ABNORMAL LOW (ref >60)
Glucose, Bld: 96 mg/dL (ref 70–99)
Potassium: 4 mmol/L (ref 3.5–5.1)
Sodium: 142 mmol/L (ref 135–145)

## 2020-02-14 LAB — CBC
HCT: 30.2 % — ABNORMAL LOW (ref 36.0–46.0)
Hemoglobin: 9.7 g/dL — ABNORMAL LOW (ref 12.0–15.0)
MCH: 32.1 pg (ref 26.0–34.0)
MCHC: 32.1 g/dL (ref 30.0–36.0)
MCV: 100 fL (ref 80.0–100.0)
Platelets: 290 10*3/uL (ref 150–400)
RBC: 3.02 MIL/uL — ABNORMAL LOW (ref 3.87–5.11)
RDW: 13.7 % (ref 11.5–15.5)
WBC: 6.6 10*3/uL (ref 4.0–10.5)
nRBC: 0 % (ref 0.0–0.2)

## 2020-02-14 NOTE — Progress Notes (Signed)
PROGRESS NOTE    Christina Morgan  ZYS:[id number] DOB: 1933-10-18 DOA: 02/11/2020 PCP: Sueanne Margarita, MD     Brief Narrative:  86 year old woman admitted from home on 3/2 with complaints of altered mental status.  Her past medical history significant for endometrial cancer, chronic diastolic heart failure, chronic kidney disease, hyperlipidemia, hypertension, paroxysmal atrial fibrillation.  She lives in [place].  Per report she had been hallucinating about her husband who passed away several years ago.  Admission was requested for further evaluation and management.   Assessment & Plan:   Principal Problem:   Acute encephalopathy Active Problems:   Endometrial ca [facility])   Essential hypertension, benign   Mixed hyperlipidemia   Persistent atrial fibrillation (HCC)   Edema of extremities   Chronic diastolic heart failure (HCC)   Stage 3 chronic kidney disease   Cervical spine fracture (HCC)   CAP (community acquired pneumonia)   Bacteremia due to Gram-positive bacteria   Acute metabolic encephalopathy -CT of the head negative. -Chest x-ray with lingular disease possibly pneumonia versus atelectasis. -No great electrolyte abnormalities that could explain presentation. -Blood cultures on Gram stain are positive for gram-positive rods. -On meropenem pending final culture data.  Gram-positive rod bacteremia -As above, placed on meropenem pending final culture data.  Macrocytic anemia -Folate okay, vitamin B12 low normal at 213.  Chronic kidney disease stage IIIa -Baseline creatinine between 1.3 and 1.5. -Currently at baseline.  Persistent atrial fibrillation -Rate controlled. -Anticoagulated on Eliquis.  Chronic diastolic heart failure -Stable, compensated. -Continue beta-blocker.  Hypertension -Well-controlled while hospitalized  Endometrial cancer -Continue outpatient follow-up.  Hyperlipidemia -On atorvastatin.  Recent C-spine fracture -She  is wearing c-collar, will need to reschedule outpatient follow-up with neurosurgery due to hospitalization.    DVT prophylaxis: Fully anticoagulated on Eliquis Code Status: Full code Family Communication: Patient only Disposition Plan: Back to independent living pending final blood culture data and finalization of antibiotic treatment.  Consultants:   None  Procedures:   2D echo with ejection fraction of 60 to 65% with grade 2 diastolic dysfunction and no wall motion abnormalities.  Antimicrobials:  Anti-infectives (From admission, onward)   Start     Dose/Rate Route Frequency Ordered Stop   02/13/20 1600  meropenem (MERREM) 1 g in sodium chloride 0.9 % 100 mL IVPB     1 g 200 mL/hr over 30 Minutes Intravenous Every 12 hours 02/13/20 1508     02/12/20 2200  cefTRIAXone (ROCEPHIN) 1 g in sodium chloride 0.9 % 100 mL IVPB  Status:  Discontinued     1 g 200 mL/hr over 30 Minutes Intravenous Every 24 hours 02/11/20 2333 02/13/20 1508   02/12/20 2100  cefTRIAXone (ROCEPHIN) 1 g in sodium chloride 0.9 % 100 mL IVPB  Status:  Discontinued     1 g 200 mL/hr over 30 Minutes Intravenous Every 24 hours 02/11/20 2222 02/11/20 2333   02/12/20 1800  azithromycin (ZITHROMAX) tablet 500 mg     500 mg Oral Daily 02/11/20 2222 02/13/20 1043   02/11/20 1900  cefTRIAXone (ROCEPHIN) 1 g in sodium chloride 0.9 % 100 mL IVPB     1 g 200 mL/hr over 30 Minutes Intravenous  Once 02/11/20 1856 02/11/20 2132   02/11/20 1900  azithromycin (ZITHROMAX) 500 mg in sodium chloride 0.9 % 250 mL IVPB     500 mg 250 mL/hr over 60 Minutes Intravenous  Once 02/11/20 1857 02/11/20 2238       Subjective: Lying in bed, wearing her  c-collar.  Has no complaints today.  Objective: Vitals:   02/14/20 0022 02/14/20 0432 02/14/20 0757 02/14/20 0810  BP: (!) 127/54 (!) 157/58 (!) 163/63 (!) 163/63  Pulse: (!) 59 64 66 66  Resp: 20 20 19 19   Temp: 97.6 F (36.4 C) 98.2 F (36.8 C) 98.4 F (36.9 C) 98.4 F (36.9  C)  TempSrc: Axillary Axillary Axillary Axillary  SpO2: 100% 95% 94%   Weight:      Height:        Intake/Output Summary (Last 24 hours) at 02/14/2020 0853 Last data filed at 02/14/2020 0434 Gross per 24 hour  Intake 440 ml  Output 400 ml  Net 40 ml   Filed Weights   02/12/20 0109  Weight: 78.4 kg    Examination:  General exam: Alert, awake, oriented x 3, wearing c-collar Respiratory system: Clear to auscultation. Respiratory effort normal. Cardiovascular system:RRR. No murmurs, rubs, gallops. Gastrointestinal system: Abdomen is nondistended, soft and nontender. No organomegaly or masses felt. Normal bowel sounds heard. Central nervous system: Alert and oriented. No focal neurological deficits. Extremities: No C/C/E, +pedal pulses Skin: No rashes, lesions or ulcers Psychiatry: Judgement and insight appear normal. Mood & affect appropriate.     Data Reviewed: I have personally reviewed following labs and imaging studies  CBC: Recent Labs  Lab 02/11/20 1506 02/12/20 0545 02/13/20 0248 02/14/20 0343  WBC 8.5 7.4 6.9 6.6  NEUTROABS 6.3  --   --   --   HGB 10.7* 9.8* 9.2* 9.7*  HCT 33.9* 30.4* 29.1* 30.2*  MCV 102.1* 99.0 101.0* 100.0  PLT 267 260 265 094   Basic Metabolic Panel: Recent Labs  Lab 02/11/20 1506 02/12/20 0545 02/13/20 0248 02/14/20 0343  NA 140 141 144 142  K 4.6 4.2 4.3 4.0  CL 107 107 109 108  CO2 25 23 24 24   GLUCOSE 111* 98 103* 96  BUN 31* 26* 27* 24*  CREATININE 1.55* 1.40* 1.50* 1.50*  CALCIUM 8.9 8.8* 8.6* 8.4*   GFR: Estimated Creatinine Clearance: 30.1 mL/min (A) (by C-G formula based on SCr of 1.5 mg/dL (H)). Liver Function Tests: Recent Labs  Lab 02/11/20 1506  AST 17  ALT 15  ALKPHOS 75  BILITOT 0.6  PROT 6.1*  ALBUMIN 3.1*   No results for input(s): LIPASE, AMYLASE in the last 168 hours. No results for input(s): AMMONIA in the last 168 hours. Coagulation Profile: No results for input(s): INR, PROTIME in the last 168  hours. Cardiac Enzymes: No results for input(s): CKTOTAL, CKMB, CKMBINDEX, TROPONINI in the last 168 hours. BNP (last 3 results) No results for input(s): PROBNP in the last 8760 hours. HbA1C: No results for input(s): HGBA1C in the last 72 hours. CBG: Recent Labs  Lab 02/11/20 1541  GLUCAP 93   Lipid Profile: No results for input(s): CHOL, HDL, LDLCALC, TRIG, CHOLHDL, LDLDIRECT in the last 72 hours. Thyroid Function Tests: Recent Labs    02/12/20 0545  TSH 2.620   Anemia Panel: Recent Labs    02/13/20 1700 02/13/20 1722  VITAMINB12  --  213  FOLATE  --  14.2  FERRITIN  --  360*  TIBC  --  217*  IRON  --  31  RETICCTPCT 1.7  --    Urine analysis:    Component Value Date/Time   COLORURINE STRAW (A) 02/11/2020 1830   APPEARANCEUR CLEAR 02/11/2020 1830   LABSPEC 1.008 02/11/2020 1830   PHURINE 5.0 02/11/2020 1830   GLUCOSEU NEGATIVE 02/11/2020 1830   HGBUR  NEGATIVE 02/11/2020 1830   BILIRUBINUR NEGATIVE 02/11/2020 [place] 02/11/2020 1830   PROTEINUR NEGATIVE 02/11/2020 1830   UROBILINOGEN 0.2 06/19/2014 2131   NITRITE NEGATIVE 02/11/2020 1830   LEUKOCYTESUR SMALL (A) 02/11/2020 1830   Sepsis Labs: @LABRCNTIP (procalcitonin:4,lacticidven:4)  ) Recent Results (from the past 240 hour(s))  Urine culture     Status: Abnormal   Collection Time: 02/11/20  5:54 PM   Specimen: Urine, Random  Result Value Ref Range Status   Specimen Description URINE, RANDOM  Final   Special Requests NONE  Final   Culture (A)  Final    <10,000 COLONIES/mL INSIGNIFICANT GROWTH Performed at [facility], [place] [address] [place], [place] [address]    Report Status 02/12/2020 FINAL  Final  SARS CORONAVIRUS 2 (TAT 6-24 HRS) Nasopharyngeal Nasopharyngeal Swab     Status: None   Collection Time: 02/11/20  8:18 PM   Specimen: Nasopharyngeal Swab  Result Value Ref Range Status   SARS Coronavirus 2 NEGATIVE NEGATIVE Final    Comment: (NOTE) SARS-CoV-2 target  nucleic acids are NOT DETECTED. The SARS-CoV-2 RNA is generally detectable in upper and lower respiratory specimens during the acute phase of infection. Negative results do not preclude SARS-CoV-2 infection, do not rule out co-infections with other pathogens, and should not be used as the sole basis for treatment or other patient management decisions. Negative results must be combined with clinical observations, patient history, and epidemiological information. The expected result is Negative. Fact Sheet for Patients: SugarRoll.be Fact Sheet for Healthcare Providers: https://www.woods-mathews.com/ This test is not yet approved or cleared by the [place] FDA and  has been authorized for detection and/or diagnosis of SARS-CoV-2 by FDA under an Emergency Use Authorization (EUA). This EUA will remain  in effect (meaning this test can be used) for the duration of the COVID-19 declaration under Section 56 4(b)(1) of the Act, 21 U.S.C. section 360bbb-3(b)(1), unless the authorization is terminated or revoked sooner. Performed at [facility], [place] [address] [place], [place] [address]   Blood culture (routine x 2)     Status: None (Preliminary result)   Collection Time: 02/11/20  8:18 PM   Specimen: BLOOD RIGHT ARM  Result Value Ref Range Status   Specimen Description BLOOD RIGHT ARM  Final   Special Requests   Final    BOTTLES DRAWN AEROBIC AND ANAEROBIC Blood Culture adequate volume   Culture  Setup Time   Final    IN BOTH AEROBIC AND ANAEROBIC BOTTLES GRAM POSITIVE RODS CRITICAL RESULT CALLED TO, READ BACK BY AND VERIFIED WITH: K AMEND [facility] 02/13/20 0437 JDW    Culture   Final    NO GROWTH 2 DAYS Performed at [facility], [place] [address], [place], [place] [address]    Report Status PENDING  Incomplete  Blood culture (routine x 2)     Status: None (Preliminary result)   Collection Time: 02/11/20  8:18 PM   Specimen: BLOOD  LEFT ARM  Result Value Ref Range Status   Specimen Description BLOOD LEFT ARM  Final   Special Requests   Final    BOTTLES DRAWN AEROBIC AND ANAEROBIC Blood Culture adequate volume   Culture  Setup Time   Final    AEROBIC BOTTLE ONLY GRAM POSITIVE RODS CRITICAL VALUE NOTED.  VALUE IS CONSISTENT WITH PREVIOUSLY REPORTED AND CALLED VALUE.    Culture   Final    NO GROWTH 2 DAYS Performed at [facility], [address] [place], [place] [address]    Report Status PENDING  Incomplete         Radiology Studies: ECHOCARDIOGRAM LIMITED  Result Date: 02/12/2020    ECHOCARDIOGRAM LIMITED REPORT   Patient Name:   Christina Morgan Date of Exam: 02/12/2020 Medical Rec #:  [id number]           Height:       67.0 in Accession #:    [id number]          Weight:       172.8 lb Date of Birth:  1933/12/06            BSA:          1.901 m Patient Age:    66 years            BP:           134/41 mmHg Patient Gender: F                   HR:           58 bpm. Exam Location:  Inpatient Procedure: 2D Echo Indications:    CHF 428  History:        Patient has prior history of Echocardiogram examinations, most                 recent 09/24/2019. CHF; Risk Factors:Hypertension and                 Dyslipidemia. PAF.  Sonographer:    Jannett Celestine RDCS (AE) Referring Phys: [id number] CHELSEA N FAIR  Sonographer Comments: Suboptimal apical window and suboptimal subcostal window. restricted mobility w neck brace IMPRESSIONS  1. Left ventricular ejection fraction, by estimation, is 60 to 65%. The left ventricle has normal function. The left ventricle has no regional wall motion abnormalities. Left ventricular diastolic parameters are consistent with Grade II diastolic dysfunction (pseudonormalization).  2. Left atrial size was mildly dilated.  3. The mitral valve is normal in structure and function. No evidence of mitral valve regurgitation. No evidence of mitral stenosis.  4. The aortic valve is tricuspid. Aortic valve  regurgitation is mild. Mild to moderate aortic valve sclerosis/calcification is present, without any evidence of aortic stenosis.  5. Tricuspid regurgitation signal is inadequate for assessing PA pressure.  6. The inferior vena cava is normal in size with greater than 50% respiratory variability, suggesting right atrial pressure of 3 mmHg.  7. Technically difficult study with poor acoustic windows. FINDINGS  Left Ventricle: Left ventricular ejection fraction, by estimation, is 60 to 65%. The left ventricle has normal function. The left ventricle has no regional wall motion abnormalities. Right Ventricle: Tricuspid regurgitation signal is inadequate for assessing PA pressure. Left Atrium: Left atrial size was mildly dilated. Right Atrium: Right atrial size was normal in size. Pericardium: There is no evidence of pericardial effusion. Mitral Valve: The mitral valve is normal in structure and function. Mild to moderate mitral annular calcification. No evidence of mitral valve stenosis. Tricuspid Valve: The tricuspid valve is normal in structure. Tricuspid valve regurgitation is not demonstrated. Aortic Valve: The aortic valve is tricuspid. Aortic valve regurgitation is mild. Mild to moderate aortic valve sclerosis/calcification is present, without any evidence of aortic stenosis. Pulmonic Valve: The pulmonic valve was normal in structure. Pulmonic valve regurgitation is trivial. Venous: The inferior vena cava is normal in size with greater than 50% respiratory variability, suggesting right atrial pressure of 3 mmHg. IAS/Shunts: No atrial level shunt  detected by color flow Doppler. Loralie Champagne MD Electronically signed by Loralie Champagne MD Signature Date/Time: 02/12/2020/4:44:27 PM    Final         Scheduled Meds: . apixaban  2.5 mg Oral BID  . atenolol  25 mg Oral BID  . atorvastatin  10 mg Oral QHS  . docusate sodium  100 mg Oral BID  . flecainide  100 mg Oral BID  . hydrALAZINE  25 mg Oral BID  .  torsemide  10 mg Oral Daily   Continuous Infusions: . meropenem (MERREM) IV 1 g (02/14/20 0503)     LOS: 3 days    Time spent: 25 minutes. Greater than 50% of this time was spent in direct contact with the patient, coordinating care and discussing relevant ongoing clinical issues.     Lelon Frohlich, MD [facility] Pager [phone]  If 7PM-7AM, please contact night-coverage [email or web address] Password [facility] 02/14/2020, 8:53 AM

## 2020-02-14 NOTE — Progress Notes (Signed)
Physical Therapy Treatment Patient Details Name: Christina Morgan MRN: [id number] DOB: [birth year] Today's Date: 02/14/2020    History of Present Illness Pt is an 86 y/o female admitted secondary to hallucinations and AMS. Feel likely secondary to PNA and UTI. Pt with recent fall with C spine fx. PMH includes HTN, a fib, CKD, dCHF, and endomentrial cancer.     PT Comments    Pt performed functional mobility this session with moderate assistance.  She remains limited due to loose bowel movements.  Pt required increased time on bedside commode after transfer training so deferred gt at this time.  Continue to recommend rehab in a post acute setting.     Follow Up Recommendations  SNF;Supervision/Assistance - 24 hour     Equipment Recommendations  None recommended by PT    Recommendations for Other Services       Precautions / Restrictions Precautions Precautions: Fall;Cervical Precaution Booklet Issued: No Precaution Comments: Verbally reviewed cervical precautions.  Required Braces or Orthoses: Cervical Brace Cervical Brace: Hard collar Restrictions Weight Bearing Restrictions: Yes    Mobility  Bed Mobility Overal bed mobility: Needs Assistance Bed Mobility: Rolling;Sidelying to Sit;Sit to Sidelying Rolling: Mod assist Sidelying to sit: Mod assist       General bed mobility comments: Pt required multiple bouts of rolling this session to clean pt of stool incontinence. Pt able to perform sidelying to sit with max cues and moderate assistance to advance LEs and elevate trunk into a seated position.  Transfers Overall transfer level: Needs assistance Equipment used: Rolling walker (2 wheeled);None Transfers: Sit to/from Stand Sit to Stand: Mod assist         General transfer comment: Cues for hand placement to and from seated surface.  Pt performed transfer from bed to commode face to face with moderate assistance and no device.  Performed additional sit to stand with  RW and posterior lean.  She had bowel movements with every standing trial so she was not able to advance gt this session.  Ambulation/Gait Ambulation/Gait assistance: (NT due to loose stools.)               Stairs             Wheelchair Mobility    Modified Rankin (Stroke Patients Only)       Balance Overall balance assessment: Needs assistance   Sitting balance-Leahy Scale: Fair       Standing balance-Leahy Scale: Poor                              Cognition Arousal/Alertness: Awake/alert Behavior During Therapy: WFL for tasks assessed/performed Overall Cognitive Status: Within Functional Limits for tasks assessed                                 General Comments: Pt appears to function within normal limits this session.  She is able to follow all commands and progress to transfer training away from bed.  She remains limited due to bowel incontinence.      Exercises      General Comments        Pertinent Vitals/Pain Pain Assessment: 0-10 Pain Score: 3  Pain Location: neck  Pain Descriptors / Indicators: Aching;Guarding;Grimacing Pain Intervention(s): Monitored during session;Repositioned    Home Living  Prior Function            PT Goals (current goals can now be found in the care plan section) Acute Rehab PT Goals Patient Stated Goal: to get stronger Potential to Achieve Goals: Good Progress towards PT goals: Progressing toward goals    Frequency    Min 2X/week      PT Plan Current plan remains appropriate    Co-evaluation              AM-PAC PT "6 Clicks" Mobility   Outcome Measure  Help needed turning from your back to your side while in a flat bed without using bedrails?: A Lot Help needed moving from lying on your back to sitting on the side of a flat bed without using bedrails?: A Lot Help needed moving to and from a bed to a chair (including a wheelchair)?: A  Lot Help needed standing up from a chair using your arms (e.g., wheelchair or bedside chair)?: A Lot Help needed to walk in hospital room?: Total Help needed climbing 3-5 steps with a railing? : Total 6 Click Score: 10    End of Session Equipment Utilized During Treatment: Gait belt Activity Tolerance: Patient tolerated treatment well Patient left: in bed;with call bell/phone within reach;with bed alarm set;with family/visitor present;with nursing/sitter in room Nurse Communication: Mobility status PT Visit Diagnosis: Unsteadiness on feet (R26.81);History of falling (Z91.81);Muscle weakness (generalized) (M62.81)     Time: 0929-5747 PT Time Calculation (min) (ACUTE ONLY): 36 min  Charges:  $Therapeutic Activity: 23-37 mins                     Erasmo Leventhal , PTA Acute Rehabilitation Services Pager [phone] Office [phone]     Vere Diantonio Eli Hose 02/14/2020, 2:13 PM

## 2020-02-14 NOTE — Progress Notes (Signed)
Orthopedic Tech Progress Note Patient Details:  Christina Morgan [birth year] [id number] Was called about new padding for patients HARD COLLAR. Materials said the pads could be hand washed or she'll have to get a whole new collar. Called back and told the RN. Patient ID: Christina Morgan, female   DOB: Apr 05, 1933, 86 y.o.   MRN: [id number]   Janit Pagan 02/14/2020, 1:13 PM

## 2020-02-14 NOTE — Care Management Important Message (Signed)
Important Message  Patient Details  Name: SHAKEITA VANDEVANDER MRN: [id number] Date of Birth: 08/19/1933   Medicare Important Message Given:  Yes  Due to illness patient was not able to sign.  Unsigned copy left at patient bedside.    Jaina Morin 02/14/2020, 11:06 AM

## 2020-02-15 ENCOUNTER — Encounter (HOSPITAL_COMMUNITY): Payer: Self-pay | Admitting: Family Medicine

## 2020-02-15 LAB — SARS CORONAVIRUS 2 (TAT 6-24 HRS): SARS Coronavirus 2: NEGATIVE

## 2020-02-15 LAB — CULTURE, BLOOD (ROUTINE X 2): Special Requests: ADEQUATE

## 2020-02-15 MED ORDER — AMOXICILLIN 500 MG PO CAPS
500.0000 mg | ORAL_CAPSULE | Freq: Three times a day (TID) | ORAL | Status: DC
  Administered 2020-02-15 – 2020-02-16 (×3): 500 mg via ORAL
  Filled 2020-02-15 (×3): qty 1

## 2020-02-15 NOTE — Progress Notes (Signed)
Patient was assessed bedside with normal breathing pattern observed. RR rate did not have good pleth.waveform which most likely caused erroneous reading. Re-measured RR 17-18 which lowered MEWS from 2 to 0.

## 2020-02-15 NOTE — TOC Progression Note (Addendum)
Transition of Care [facility]) - Progression Note    Patient Details  Name: Christina Morgan MRN: [id number] Date of Birth: [birth year]  Transition of Care [facility]) CM/SW [place], [place] Phone Number: 02/15/2020, 11:24 AM  Clinical Narrative:    1:20p: CSW confirmed with April of [employer] that patient is able to discharge to facility over the weekend once medically cleared. A new covid will not be needed if within 72 hours.  CSW reached out to MD for an update on patient's medical readiness. CSW informed patient will likely be medically stable 3/7. CSW made telephone all to [facility] [phone] to confirm patients discharge to facility. Left a voicemail. CSW will continue to follow & assist with discharge planning needs.    Expected Discharge Plan: [place] Barriers to Discharge: Continued Medical Work up  Expected Discharge Plan and Services Expected Discharge Plan: [place] Choice: [place] arrangements for the past 2 months: Assisted Living Facility                                       Social Determinants of Health (SDOH) Interventions    Readmission Risk Interventions No flowsheet data found.

## 2020-02-15 NOTE — Progress Notes (Signed)
PROGRESS NOTE    CELISSE CIULLA  ZOX:[id number] DOB: [birth year] DOA: 02/11/2020 PCP: Sueanne Margarita, MD     Brief Narrative:  86 year old woman admitted from home on 3/2 with complaints of altered mental status.  Her past medical history significant for endometrial cancer, chronic diastolic heart failure, chronic kidney disease, hyperlipidemia, hypertension, paroxysmal atrial fibrillation.  She lives in [place].  Per report she had been hallucinating about her husband who passed away several years ago.  Admission was requested for further evaluation and management.   Assessment & Plan:   Principal Problem:   Acute encephalopathy Active Problems:   Endometrial ca [facility])   Essential hypertension, benign   Mixed hyperlipidemia   Persistent atrial fibrillation (HCC)   Edema of extremities   Chronic diastolic heart failure (HCC)   Stage 3 chronic kidney disease   Cervical spine fracture (HCC)   CAP (community acquired pneumonia)   Bacteremia due to Gram-positive bacteria   Acute metabolic encephalopathy -CT of the head negative. -Chest x-ray with lingular disease possibly pneumonia versus atelectasis. -No great electrolyte abnormalities that could explain presentation. -Blood cultures on Gram stain are positive for gram-positive rods. -On meropenem pending final culture data.  Gram-positive rod bacteremia -As above, placed on meropenem pending final culture data.  Macrocytic anemia -Folate okay, vitamin B12 low normal at 213.  Chronic kidney disease stage IIIa -Baseline creatinine between 1.3 and 1.5. -Currently at baseline.  Persistent atrial fibrillation -Rate controlled. -Anticoagulated on Eliquis.  Chronic diastolic heart failure -Stable, compensated. -Continue beta-blocker.  Hypertension -Fair control while hospitalized  Endometrial cancer -Continue outpatient follow-up.  Hyperlipidemia -On atorvastatin.  Recent C-spine fracture -She is  wearing c-collar, will need to reschedule outpatient follow-up with neurosurgery due to hospitalization.    DVT prophylaxis: Fully anticoagulated on Eliquis Code Status: Full code Family Communication: Patient only Disposition Plan: Back to independent living pending final blood culture data and finalization of antibiotic treatment.  Consultants:   None  Procedures:   2D echo with ejection fraction of 60 to 65% with grade 2 diastolic dysfunction and no wall motion abnormalities.  Antimicrobials:  Anti-infectives (From admission, onward)   Start     Dose/Rate Route Frequency Ordered Stop   02/13/20 1600  meropenem (MERREM) 1 g in sodium chloride 0.9 % 100 mL IVPB     1 g 200 mL/hr over 30 Minutes Intravenous Every 12 hours 02/13/20 1508     02/12/20 2200  cefTRIAXone (ROCEPHIN) 1 g in sodium chloride 0.9 % 100 mL IVPB  Status:  Discontinued     1 g 200 mL/hr over 30 Minutes Intravenous Every 24 hours 02/11/20 2333 02/13/20 1508   02/12/20 2100  cefTRIAXone (ROCEPHIN) 1 g in sodium chloride 0.9 % 100 mL IVPB  Status:  Discontinued     1 g 200 mL/hr over 30 Minutes Intravenous Every 24 hours 02/11/20 2222 02/11/20 2333   02/12/20 1800  azithromycin (ZITHROMAX) tablet 500 mg     500 mg Oral Daily 02/11/20 2222 02/13/20 1043   02/11/20 1900  cefTRIAXone (ROCEPHIN) 1 g in sodium chloride 0.9 % 100 mL IVPB     1 g 200 mL/hr over 30 Minutes Intravenous  Once 02/11/20 1856 02/11/20 2132   02/11/20 1900  azithromycin (ZITHROMAX) 500 mg in sodium chloride 0.9 % 250 mL IVPB     500 mg 250 mL/hr over 60 Minutes Intravenous  Once 02/11/20 1857 02/11/20 2238       Subjective: Lying in bed, wearing  her c-collar.  Has no complaints today.  Objective: Vitals:   02/14/20 1956 02/15/20 0002 02/15/20 0300 02/15/20 0731  BP: (!) 132/49 (!) 147/60 (!) 132/45 (!) 163/55  Pulse: 62 60 62 64  Resp: 19 18 18 17   Temp: 98.3 F (36.8 C) 98 F (36.7 C) 98.4 F (36.9 C) 98.6 F (37 C)    TempSrc: Oral Oral Oral Oral  SpO2: 99% 99% 99% 96%  Weight:      Height:        Intake/Output Summary (Last 24 hours) at 02/15/2020 0944 Last data filed at 02/15/2020 0500 Gross per 24 hour  Intake 880 ml  Output --  Net 880 ml   Filed Weights   02/12/20 0109  Weight: 78.4 kg    Examination:  General exam: Alert, awake, oriented x 3, wearing c-collar Respiratory system: Clear to auscultation. Respiratory effort normal. Cardiovascular system:RRR. No murmurs, rubs, gallops. Gastrointestinal system: Abdomen is nondistended, soft and nontender. No organomegaly or masses felt. Normal bowel sounds heard. Central nervous system: Alert and oriented. No focal neurological deficits. Extremities: No C/C/E, +pedal pulses Skin: No rashes, lesions or ulcers Psychiatry: Judgement and insight appear normal. Mood & affect appropriate.     Data Reviewed: I have personally reviewed following labs and imaging studies  CBC: Recent Labs  Lab 02/11/20 1506 02/12/20 0545 02/13/20 0248 02/14/20 0343  WBC 8.5 7.4 6.9 6.6  NEUTROABS 6.3  --   --   --   HGB 10.7* 9.8* 9.2* 9.7*  HCT 33.9* 30.4* 29.1* 30.2*  MCV 102.1* 99.0 101.0* 100.0  PLT 267 260 265 485   Basic Metabolic Panel: Recent Labs  Lab 02/11/20 1506 02/12/20 0545 02/13/20 0248 02/14/20 0343  NA 140 141 144 142  K 4.6 4.2 4.3 4.0  CL 107 107 109 108  CO2 25 23 24 24   GLUCOSE 111* 98 103* 96  BUN 31* 26* 27* 24*  CREATININE 1.55* 1.40* 1.50* 1.50*  CALCIUM 8.9 8.8* 8.6* 8.4*   GFR: Estimated Creatinine Clearance: 30.1 mL/min (A) (by C-G formula based on SCr of 1.5 mg/dL (H)). Liver Function Tests: Recent Labs  Lab 02/11/20 1506  AST 17  ALT 15  ALKPHOS 75  BILITOT 0.6  PROT 6.1*  ALBUMIN 3.1*   No results for input(s): LIPASE, AMYLASE in the last 168 hours. No results for input(s): AMMONIA in the last 168 hours. Coagulation Profile: No results for input(s): INR, PROTIME in the last 168 hours. Cardiac  Enzymes: No results for input(s): CKTOTAL, CKMB, CKMBINDEX, TROPONINI in the last 168 hours. BNP (last 3 results) No results for input(s): PROBNP in the last 8760 hours. HbA1C: No results for input(s): HGBA1C in the last 72 hours. CBG: Recent Labs  Lab 02/11/20 1541  GLUCAP 93   Lipid Profile: No results for input(s): CHOL, HDL, LDLCALC, TRIG, CHOLHDL, LDLDIRECT in the last 72 hours. Thyroid Function Tests: No results for input(s): TSH, T4TOTAL, FREET4, T3FREE, THYROIDAB in the last 72 hours. Anemia Panel: Recent Labs    02/13/20 1700 02/13/20 1722  VITAMINB12  --  213  FOLATE  --  14.2  FERRITIN  --  360*  TIBC  --  217*  IRON  --  31  RETICCTPCT 1.7  --    Urine analysis:    Component Value Date/Time   COLORURINE STRAW (A) 02/11/2020 1830   APPEARANCEUR CLEAR 02/11/2020 1830   LABSPEC 1.008 02/11/2020 1830   PHURINE 5.0 02/11/2020 [place] NEGATIVE 02/11/2020 1830  HGBUR NEGATIVE 02/11/2020 [place] NEGATIVE 02/11/2020 [place] 02/11/2020 1830   PROTEINUR NEGATIVE 02/11/2020 1830   UROBILINOGEN 0.2 06/19/2014 2131   NITRITE NEGATIVE 02/11/2020 1830   LEUKOCYTESUR SMALL (A) 02/11/2020 1830   Sepsis Labs: @LABRCNTIP (procalcitonin:4,lacticidven:4)  ) Recent Results (from the past 240 hour(s))  Urine culture     Status: Abnormal   Collection Time: 02/11/20  5:54 PM   Specimen: Urine, Random  Result Value Ref Range Status   Specimen Description URINE, RANDOM  Final   Special Requests NONE  Final   Culture (A)  Final    <10,000 COLONIES/mL INSIGNIFICANT GROWTH Performed at [facility], [place] [address], [place], [place] [address]    Report Status 02/12/2020 FINAL  Final  SARS CORONAVIRUS 2 (TAT 6-24 HRS) Nasopharyngeal Nasopharyngeal Swab     Status: None   Collection Time: 02/11/20  8:18 PM   Specimen: Nasopharyngeal Swab  Result Value Ref Range Status   SARS Coronavirus 2 NEGATIVE NEGATIVE Final    Comment:  (NOTE) SARS-CoV-2 target nucleic acids are NOT DETECTED. The SARS-CoV-2 RNA is generally detectable in upper and lower respiratory specimens during the acute phase of infection. Negative results do not preclude SARS-CoV-2 infection, do not rule out co-infections with other pathogens, and should not be used as the sole basis for treatment or other patient management decisions. Negative results must be combined with clinical observations, patient history, and epidemiological information. The expected result is Negative. Fact Sheet for Patients: SugarRoll.be Fact Sheet for Healthcare Providers: https://www.woods-mathews.com/ This test is not yet approved or cleared by the [place] FDA and  has been authorized for detection and/or diagnosis of SARS-CoV-2 by FDA under an Emergency Use Authorization (EUA). This EUA will remain  in effect (meaning this test can be used) for the duration of the COVID-19 declaration under Section 56 4(b)(1) of the Act, 21 U.S.C. section 360bbb-3(b)(1), unless the authorization is terminated or revoked sooner. Performed at [facility], [place] [address], [place], [place] [address]   Blood culture (routine x 2)     Status: None (Preliminary result)   Collection Time: 02/11/20  8:18 PM   Specimen: BLOOD RIGHT ARM  Result Value Ref Range Status   Specimen Description BLOOD RIGHT ARM  Final   Special Requests   Final    BOTTLES DRAWN AEROBIC AND ANAEROBIC Blood Culture adequate volume   Culture  Setup Time   Final    IN BOTH AEROBIC AND ANAEROBIC BOTTLES GRAM POSITIVE RODS CRITICAL RESULT CALLED TO, READ BACK BY AND VERIFIED WITH: K AMEND [facility] 02/13/20 0437 JDW Performed at [facility], [place] [address], [place], [place] [address]    Culture GRAM POSITIVE RODS  Final   Report Status PENDING  Incomplete  Blood culture (routine x 2)     Status: None (Preliminary result)   Collection Time: 02/11/20  8:18 PM    Specimen: BLOOD LEFT ARM  Result Value Ref Range Status   Specimen Description BLOOD LEFT ARM  Final   Special Requests   Final    BOTTLES DRAWN AEROBIC AND ANAEROBIC Blood Culture adequate volume   Culture  Setup Time   Final    AEROBIC BOTTLE ONLY GRAM POSITIVE RODS CRITICAL VALUE NOTED.  VALUE IS CONSISTENT WITH PREVIOUSLY REPORTED AND CALLED VALUE. Performed at [facility], [place] [address], [place], [place] [address]    Culture GRAM POSITIVE RODS  Final   Report Status  PENDING  Incomplete  SARS CORONAVIRUS 2 (TAT 6-24 HRS) Nasopharyngeal Nasopharyngeal Swab     Status: None   Collection Time: 02/14/20 12:59 PM   Specimen: Nasopharyngeal Swab  Result Value Ref Range Status   SARS Coronavirus 2 NEGATIVE NEGATIVE Final    Comment: (NOTE) SARS-CoV-2 target nucleic acids are NOT DETECTED. The SARS-CoV-2 RNA is generally detectable in upper and lower respiratory specimens during the acute phase of infection. Negative results do not preclude SARS-CoV-2 infection, do not rule out co-infections with other pathogens, and should not be used as the sole basis for treatment or other patient management decisions. Negative results must be combined with clinical observations, patient history, and epidemiological information. The expected result is Negative. Fact Sheet for Patients: SugarRoll.be Fact Sheet for Healthcare Providers: https://www.woods-mathews.com/ This test is not yet approved or cleared by the [place] FDA and  has been authorized for detection and/or diagnosis of SARS-CoV-2 by FDA under an Emergency Use Authorization (EUA). This EUA will remain  in effect (meaning this test can be used) for the duration of the COVID-19 declaration under Section 56 4(b)(1) of the Act, 21 U.S.C. section 360bbb-3(b)(1), unless the authorization is terminated or revoked sooner. Performed at [facility], [place] [address]  [place], [place] [address]          Radiology Studies: No results found.      Scheduled Meds: . apixaban  2.5 mg Oral BID  . atenolol  25 mg Oral BID  . atorvastatin  10 mg Oral QHS  . docusate sodium  100 mg Oral BID  . flecainide  100 mg Oral BID  . hydrALAZINE  25 mg Oral BID  . torsemide  10 mg Oral Daily   Continuous Infusions: . meropenem (MERREM) IV Stopped (02/15/20 0448)     LOS: 4 days    Time spent: 25 minutes. Greater than 50% of this time was spent in direct contact with the patient, coordinating care and discussing relevant ongoing clinical issues.     Lelon Frohlich, MD [facility] Pager [phone]  If 7PM-7AM, please contact night-coverage [email or web address] Password [facility] 02/15/2020, 9:44 AM

## 2020-02-16 DIAGNOSIS — S0232XD Fracture of orbital floor, left side, subsequent encounter for fracture with routine healing: Secondary | ICD-10-CM | POA: Diagnosis not present

## 2020-02-16 DIAGNOSIS — Z20828 Contact with and (suspected) exposure to other viral communicable diseases: Secondary | ICD-10-CM | POA: Diagnosis not present

## 2020-02-16 DIAGNOSIS — M542 Cervicalgia: Secondary | ICD-10-CM | POA: Diagnosis not present

## 2020-02-16 DIAGNOSIS — R7881 Bacteremia: Secondary | ICD-10-CM | POA: Diagnosis not present

## 2020-02-16 DIAGNOSIS — L89622 Pressure ulcer of left heel, stage 2: Secondary | ICD-10-CM | POA: Diagnosis not present

## 2020-02-16 DIAGNOSIS — C541 Malignant neoplasm of endometrium: Secondary | ICD-10-CM | POA: Diagnosis not present

## 2020-02-16 DIAGNOSIS — I480 Paroxysmal atrial fibrillation: Secondary | ICD-10-CM | POA: Diagnosis not present

## 2020-02-16 DIAGNOSIS — R41841 Cognitive communication deficit: Secondary | ICD-10-CM | POA: Diagnosis not present

## 2020-02-16 DIAGNOSIS — J984 Other disorders of lung: Secondary | ICD-10-CM | POA: Diagnosis not present

## 2020-02-16 DIAGNOSIS — R2681 Unsteadiness on feet: Secondary | ICD-10-CM | POA: Diagnosis not present

## 2020-02-16 DIAGNOSIS — N183 Chronic kidney disease, stage 3 unspecified: Secondary | ICD-10-CM | POA: Diagnosis not present

## 2020-02-16 DIAGNOSIS — R600 Localized edema: Secondary | ICD-10-CM | POA: Diagnosis not present

## 2020-02-16 DIAGNOSIS — J189 Pneumonia, unspecified organism: Secondary | ICD-10-CM | POA: Diagnosis not present

## 2020-02-16 DIAGNOSIS — Z9181 History of falling: Secondary | ICD-10-CM | POA: Diagnosis not present

## 2020-02-16 DIAGNOSIS — N8500 Endometrial hyperplasia, unspecified: Secondary | ICD-10-CM | POA: Diagnosis not present

## 2020-02-16 DIAGNOSIS — R278 Other lack of coordination: Secondary | ICD-10-CM | POA: Diagnosis not present

## 2020-02-16 DIAGNOSIS — S129XXD Fracture of neck, unspecified, subsequent encounter: Secondary | ICD-10-CM | POA: Diagnosis not present

## 2020-02-16 DIAGNOSIS — E559 Vitamin D deficiency, unspecified: Secondary | ICD-10-CM | POA: Diagnosis not present

## 2020-02-16 DIAGNOSIS — G9340 Encephalopathy, unspecified: Secondary | ICD-10-CM | POA: Diagnosis not present

## 2020-02-16 DIAGNOSIS — E46 Unspecified protein-calorie malnutrition: Secondary | ICD-10-CM | POA: Diagnosis not present

## 2020-02-16 DIAGNOSIS — S12112A Nondisplaced Type II dens fracture, initial encounter for closed fracture: Secondary | ICD-10-CM | POA: Diagnosis not present

## 2020-02-16 DIAGNOSIS — M6281 Muscle weakness (generalized): Secondary | ICD-10-CM | POA: Diagnosis not present

## 2020-02-16 DIAGNOSIS — Z7401 Bed confinement status: Secondary | ICD-10-CM | POA: Diagnosis not present

## 2020-02-16 DIAGNOSIS — E782 Mixed hyperlipidemia: Secondary | ICD-10-CM | POA: Diagnosis not present

## 2020-02-16 DIAGNOSIS — I10 Essential (primary) hypertension: Secondary | ICD-10-CM | POA: Diagnosis not present

## 2020-02-16 DIAGNOSIS — I5032 Chronic diastolic (congestive) heart failure: Secondary | ICD-10-CM | POA: Diagnosis not present

## 2020-02-16 DIAGNOSIS — I4819 Other persistent atrial fibrillation: Secondary | ICD-10-CM | POA: Diagnosis not present

## 2020-02-16 DIAGNOSIS — M2550 Pain in unspecified joint: Secondary | ICD-10-CM | POA: Diagnosis not present

## 2020-02-16 DIAGNOSIS — D519 Vitamin B12 deficiency anemia, unspecified: Secondary | ICD-10-CM | POA: Diagnosis not present

## 2020-02-16 LAB — CULTURE, BLOOD (ROUTINE X 2): Special Requests: ADEQUATE

## 2020-02-16 LAB — GLUCOSE, CAPILLARY: Glucose-Capillary: 88 mg/dL (ref 70–99)

## 2020-02-16 LAB — BASIC METABOLIC PANEL
Anion gap: 10 (ref 5–15)
BUN: 23 mg/dL (ref 8–23)
CO2: 23 mmol/L (ref 22–32)
Calcium: 8.2 mg/dL — ABNORMAL LOW (ref 8.9–10.3)
Chloride: 108 mmol/L (ref 98–111)
Creatinine, Ser: 1.38 mg/dL — ABNORMAL HIGH (ref 0.44–1.00)
GFR calc Af Amer: 41 mL/min — ABNORMAL LOW (ref >60)
GFR calc non Af Amer: 35 mL/min — ABNORMAL LOW (ref >60)
Glucose, Bld: 98 mg/dL (ref 70–99)
Potassium: 4.1 mmol/L (ref 3.5–5.1)
Sodium: 141 mmol/L (ref 135–145)

## 2020-02-16 LAB — CBC
HCT: 31 % — ABNORMAL LOW (ref 36.0–46.0)
Hemoglobin: 9.9 g/dL — ABNORMAL LOW (ref 12.0–15.0)
MCH: 31.8 pg (ref 26.0–34.0)
MCHC: 31.9 g/dL (ref 30.0–36.0)
MCV: 99.7 fL (ref 80.0–100.0)
Platelets: 292 10*3/uL (ref 150–400)
RBC: 3.11 MIL/uL — ABNORMAL LOW (ref 3.87–5.11)
RDW: 13.3 % (ref 11.5–15.5)
WBC: 5.9 10*3/uL (ref 4.0–10.5)
nRBC: 0 % (ref 0.0–0.2)

## 2020-02-16 MED ORDER — TRAMADOL HCL 50 MG PO TABS: 50.0000 mg | ORAL_TABLET | Freq: Four times a day (QID) | ORAL | 0 refills | Status: AC | PRN

## 2020-02-16 MED ORDER — AMOXICILLIN 500 MG PO CAPS: 500.0000 mg | ORAL_CAPSULE | Freq: Three times a day (TID) | ORAL | 0 refills | Status: AC

## 2020-02-16 NOTE — TOC Transition Note (Signed)
Transition of Care [facility]) - CM/SW Discharge Note   Patient Details  Name: Christina Morgan MRN: [id number] Date of Birth: 1933-05-02  [facility] CM/SW Contact:  Arvella Merles, LCSW Phone Number: 02/16/2020, 12:28 PM   Clinical Narrative:    Patient will DC to: [facility] Anticipated DC date: 02/16/20 Family notified: Benjamine Mola, daughter Transport by: Corey Harold   Per MD patient ready for DC to [facility]. RN, patient, patient's family, and facility notified of DC. Discharge Summary and FL2 sent to facility. RN to call report prior to discharge [phone] Room 103). DC packet on chart. Ambulance transport requested for patient.   CSW will sign off for now as social work intervention is no longer needed. Please consult [place] again if new needs arise.      Barriers to Discharge: Continued Medical Work up   Patient Goals and CMS Choice Patient states their goals for this hospitalization and ongoing recovery are:: patient unable to participate in goal setting due to confusion CMS Medicare.gov Compare Post Acute Care list provided to:: Patient Represenative (must comment) Choice offered to / list presented to : Adult Children  Discharge Placement                       Discharge Plan and Services     Post Acute Care Choice: Skilled Nursing Facility                               Social Determinants of Health (SDOH) Interventions     Readmission Risk Interventions No flowsheet data found.

## 2020-02-16 NOTE — Discharge Summary (Signed)
Physician Discharge Summary  SHRITA THIEN UEA:[id number] DOB: May 05, 1933 DOA: 02/11/2020  PCP: Sueanne Margarita, MD  Admit date: 02/11/2020 Discharge date: 02/16/2020  Time spent: 45 minutes  Recommendations for Outpatient Follow-up:  -Will be discharged home today. -Needs to complete 10 days of amoxicillin on DC. -Needs to reschedule follow up with neurosurgeon for her c-spine fracture that she missed while hospitalized.   Discharge Diagnoses:  Principal Problem:   Acute encephalopathy Active Problems:   Endometrial ca [facility])   Essential hypertension, benign   Mixed hyperlipidemia   Persistent atrial fibrillation (HCC)   Edema of extremities   Chronic diastolic heart failure (HCC)   Stage 3 chronic kidney disease   Cervical spine fracture (HCC)   CAP (community acquired pneumonia)   Bacteremia due to Gram-positive bacteria   Discharge Condition: Stable and improved  Filed Weights   02/12/20 0109  Weight: 78.4 kg    History of present illness:  As per Dr. Marice Potter on 3/2: WILMER BERRYHILL is a 86 y.o. female with PMH of endometrial cancer, chronic diastolic CHF, CKD, dyslipidemia, hypertension, paroxysmal A. fib presenting for evaluation of acute encephalopathy. Patient presented via EMS from assisted living facility [facility]). Per ED report, staff at ALF reporting that patient has been hallucinating about her husband who passed away several years ago. Per daughter, patient only receives outside help about 3x per week and is otherwise independent. The daughter began to notice confusion since yesterday including not knowing where she is at, reaching for inanimate objects and talking as if her late present is alive. Telephone encounter to her cardiologist today from her home health nurse noting that the patient has had worsening peripheral edema and 24 pound weight gain in the last 4 days.  On my assessment, patient is lying in bed but believes she is in a car and  states "I can't get these doors open, I just need to get these doors open." She denies any pain or complaints and is disoriented. Denies fever, chills, cough, SOB, chest pain, abdominal pain, nausea, vomiting, diarrhea, constipation, dysuria, hematuria, hematochezia, melena, difficulty moving arms/legs, speech difficulty, trouble eating, confusion or any other complaints. Of note, patient had a fall on 01/29/2020 and was seen in the ED and noted to have a left orbital floor fracture and C-spine fracture. She has followed up with ENT outpatient but not yet with neurosurgery. Daughter reports worsening weakness since this fall.   Hospital Course:    Acute metabolic encephalopathy -CT of the head negative. -Chest x-ray with lingular disease possibly pneumonia versus atelectasis. -No great electrolyte abnormalities that could explain presentation. -Blood cultures were positive for propionibacterium Acnes. -Was on meropenem and was transitioned to amoxicillin for 10 days on DC.  Gram-positive rod bacteremia -As above, placed on meropenem pending final culture data.  Macrocytic anemia -Folate okay, vitamin B12 low normal at 213.  Chronic kidney disease stage IIIa -Baseline creatinine between 1.3 and 1.5. -Currently at baseline.  Persistent atrial fibrillation -Rate controlled. -Anticoagulated on Eliquis.  Chronic diastolic heart failure -Stable, compensated. -Continue beta-blocker.  Hypertension -Fair control while hospitalized  Endometrial cancer -Continue outpatient follow-up.  Hyperlipidemia -On atorvastatin.  Recent C-spine fracture -She is wearing c-collar, will need to reschedule outpatient follow-up with neurosurgery due to hospitalization.   Procedures:  2D echo with ejection fraction of 60 to 65% with grade 2 diastolic dysfunction and no wall motion abnormalities.  Consultations:  None  Discharge Instructions  Discharge Instructions    Diet -  low  sodium heart healthy   Complete by: As directed    Increase activity slowly   Complete by: As directed      Allergies as of 02/16/2020   No Known Allergies     Medication List    TAKE these medications   acetaminophen 325 MG tablet Commonly known as: TYLENOL Take 1-2 tablets (325-650 mg total) by mouth every 6 (six) hours as needed for mild pain (pain score 1-3 or temp > 100.5).   albuterol 108 (90 Base) MCG/ACT inhaler Commonly known as: VENTOLIN HFA Inhale 2 puffs into the lungs every 4 (four) hours as needed for wheezing or shortness of breath (please try to use ALbuterol inhaler PRIOR to Nebs).   amoxicillin 500 MG capsule Commonly known as: AMOXIL Take 1 capsule (500 mg total) by mouth every 8 (eight) hours for 10 days.   ascorbic acid 500 MG tablet Commonly known as: VITAMIN C Take 1 tablet (500 mg total) by mouth daily.   atenolol 25 MG tablet Commonly known as: TENORMIN Take 1 tablet (25 mg total) by mouth 2 (two) times daily.   atorvastatin 10 MG tablet Commonly known as: LIPITOR Take 1 tablet (10 mg total) by mouth at bedtime.   Ayr Saline Nasal No-Drip Gel Place 1 application into both nostrils 2 (two) times daily.   sodium chloride 0.65 % Soln nasal spray Commonly known as: OCEAN Place 1 spray into both nostrils 4 (four) times daily.   docusate sodium 100 MG capsule Commonly known as: COLACE Take 1 capsule (100 mg total) by mouth 2 (two) times daily. What changed:   when to take this  reasons to take this   Eliquis 2.5 MG Tabs tablet Generic drug: apixaban Take 2.5 mg by mouth 2 (two) times daily.   flecainide 100 MG tablet Commonly known as: TAMBOCOR Take 1 tablet (100 mg total) by mouth 2 (two) times daily.   hydrALAZINE 25 MG tablet Commonly known as: APRESOLINE Take 1 tablet (25 mg total) by mouth 2 (two) times daily.   lisinopril 5 MG tablet Commonly known as: ZESTRIL Take 5 mg by mouth daily.   megestrol 40 MG tablet Commonly known  as: MEGACE Take 80 mg by mouth 2 (two) times daily.   neomycin-bacitracin-polymyxin [phone] ointment Apply 1 application topically See admin instructions. Apply to topical abrasion 2 times a day   nystatin powder Generic drug: nystatin Apply 1 g topically See admin instructions. Spread topically to area under bilateral breasts & groin twice a day at 8 AM and 8 PM   potassium chloride SA 20 MEQ tablet Commonly known as: KLOR-CON Take 1 tablet (20 mEq total) by mouth 2 (two) times daily.   tamoxifen 20 MG tablet Commonly known as: NOLVADEX Take 20 mg by mouth daily.   torsemide 10 MG tablet Commonly known as: DEMADEX Take 1 tablet (10 mg total) by mouth daily.   traMADol 50 MG tablet Commonly known as: ULTRAM Take 1 tablet (50 mg total) by mouth every 6 (six) hours as needed for up to 15 days (for pain).   Vitamin D (Ergocalciferol) 1.25 MG (50000 UNIT) Caps capsule Commonly known as: DRISDOL Take 1 capsule (50,000 Units total) by mouth every 7 (seven) days.   Vitamin D3 125 MCG (5000 UT) Caps Take 5,000 Units by mouth daily.      No Known Allergies  Contact information for follow-up providers    Sueanne Margarita, MD .   Specialty: Cardiology Contact information: [address]  [place] [address] [phone]            Contact information for after-discharge care    Destination    [facility] Preferred SNF .   Service: Skilled Nursing Contact information: [place] [phone]                   The results of significant diagnostics from this hospitalization (including imaging, microbiology, ancillary and laboratory) are listed below for reference.    Significant Diagnostic Studies: CT Head Wo Contrast  Result Date: 02/11/2020 CLINICAL DATA:  86 year old female with altered mental status. EXAM: CT HEAD WITHOUT CONTRAST TECHNIQUE: Contiguous axial images were obtained  from the base of the skull through the vertex without intravenous contrast. COMPARISON:  Head CT dated 01/29/2020. FINDINGS: Brain: There is mild age-related atrophy and chronic microvascular ischemic changes. There is no acute intracranial hemorrhage. No midline shift. No extra-axial fluid collection. Stable calcified meningioma measuring approximately 2.6 x 1.7 cm from the inner table of the left frontotemporal calvarium. Vascular: No hyperdense vessel or unexpected calcification. Skull: Normal. Negative for fracture or focal lesion. Sinuses/Orbits: There is mucoperiosteal thickening of the left maxillary sinus. Old left orbital floor fracture as well as old fracture of the left nasal bone. No air-fluid level. The mastoid air cells are clear. Other: None IMPRESSION: 1. No acute intracranial pathology. 2. Age-related atrophy and chronic microvascular ischemic changes. 3. Stable left frontotemporal calcified meningioma. Electronically Signed   By: Anner Crete M.D.   On: 02/11/2020 16:09   CT Head Wo Contrast  Result Date: 01/29/2020 CLINICAL DATA:  86 year old female status post fall striking head. On Eliquis. EXAM: CT HEAD WITHOUT CONTRAST TECHNIQUE: Contiguous axial images were obtained from the base of the skull through the vertex without intravenous contrast. COMPARISON:  Head CT 09/23/2019. FINDINGS: Brain: Chronic calcified left lateral convexity meningioma appears stable measuring 28 mm diameter. Stable associated mild mass effect at the operculum. No other intracranial mass or mass effect identified. No ventriculomegaly. No acute intracranial hemorrhage identified. Stable gray-white matter differentiation throughout the brain. No cortically based acute infarct identified. Vascular: Calcified atherosclerosis at the skull base. No suspicious intracranial vascular hyperdensity. Skull: No calvarium fracture identified. Facial bones reported separately. Sinuses/Orbits: Hemorrhage layering in the left  maxillary sinus. See face CT reported separately. Other: Left periorbital and face hematoma or contusion, tracking toward the left forehead. See face CT reported separately. IMPRESSION: 1. Left face soft tissue injury with underlying fractures, reported separately. 2. No acute traumatic injury to the brain identified. Stable chronic left operculum meningioma. Electronically Signed   By: Genevie Ann M.D.   On: 01/29/2020 21:35   CT Cervical Spine Wo Contrast  Result Date: 01/29/2020 CLINICAL DATA:  86 year old female status post fall striking head. On Eliquis. EXAM: CT CERVICAL SPINE WITHOUT CONTRAST TECHNIQUE: Multidetector CT imaging of the cervical spine was performed without intravenous contrast. Multiplanar CT image reconstructions were also generated. COMPARISON:  Cervical spine CT 09/23/2019. Face CT today reported separately. Thoracic spine CT 04/24/2019. FINDINGS: Alignment: Mildly increase straightening of cervical lordosis compared to last year. Cervicothoracic junction alignment is within normal limits. Posterior element alignment maintained. Skull base and vertebrae: Pronounced osteopenia. Visualized skull base is intact. No atlanto-occipital dissociation. The C1 vertebra appears intact but there is a combined type 2 odontoid fracture (coronal image 15) with a comminuted somewhat vertical fracture through the left articular pillar of C2 (coronal image 18). These are  nondisplaced. The C2 pedicles and posterior elements appear to remain intact. The remaining cervical vertebrae appear intact, osteopenia limits detail of the C7 body as before. There is mild chronic degenerative appearing anterolisthesis from C3-C4 through C5-C6. Soft tissues and spinal canal: No prevertebral fluid or swelling. No visible canal hematoma. Stable ligamentous hypertrophy about the odontoid. Partially retropharyngeal course of the common carotid arteries. Otherwise negative noncontrast neck soft tissues. Disc levels: Stable facet  more so than disc related degenerative changes. No cervical spinal stenosis suspected. Upper chest: Osteopenia. Stable mild T3 superior endplate compression since 04/24/2019. Calcified aortic atherosclerosis. Lung apices are clear. IMPRESSION: 1. Positive for acute C2 fracture with a combined type 2 fracture of the odontoid and a somewhat vertical fracture of the left C2 body. These are nondisplaced. 2. No other acute traumatic injury identified in the cervical spine. 3. Osteopenia.  Chronic T3 compression fracture. Study discussed by telephone with Dr. Aletta Edouard on 01/29/2020 at 21:47 . Electronically Signed   By: Genevie Ann M.D.   On: 01/29/2020 21:47   DG Chest Portable 1 View  Result Date: 02/11/2020 CLINICAL DATA:  Loss consciousness EXAM: PORTABLE CHEST 1 VIEW COMPARISON:  09/29/2019 FINDINGS: There is lingular airspace disease which may left reflect atelectasis versus pneumonia. There is no pleural effusion or pneumothorax. The heart and mediastinal contours are unremarkable. There is thoracic aortic atherosclerosis. There is no acute osseous abnormality. IMPRESSION: Lingular airspace disease which may left reflect atelectasis versus pneumonia. Electronically Signed   By: Kathreen Devoid   On: 02/11/2020 15:28   ECHOCARDIOGRAM LIMITED  Result Date: 02/12/2020    ECHOCARDIOGRAM LIMITED REPORT   Patient Name:   TULANI KIDNEY Date of Exam: 02/12/2020 Medical Rec #:  [id number]           Height:       67.0 in Accession #:    [id number]          Weight:       172.8 lb Date of Birth:  January 31, 1933            BSA:          1.901 m Patient Age:    56 years            BP:           134/41 mmHg Patient Gender: F                   HR:           58 bpm. Exam Location:  Inpatient Procedure: 2D Echo Indications:    CHF 428  History:        Patient has prior history of Echocardiogram examinations, most                 recent 09/24/2019. CHF; Risk Factors:Hypertension and                 Dyslipidemia. PAF.  Sonographer:     Jannett Celestine RDCS (AE) Referring Phys: [id number] CHELSEA N FAIR  Sonographer Comments: Suboptimal apical window and suboptimal subcostal window. restricted mobility w neck brace IMPRESSIONS  1. Left ventricular ejection fraction, by estimation, is 60 to 65%. The left ventricle has normal function. The left ventricle has no regional wall motion abnormalities. Left ventricular diastolic parameters are consistent with Grade II diastolic dysfunction (pseudonormalization).  2. Left atrial size was mildly dilated.  3. The mitral valve is normal in structure and function. No evidence of mitral valve regurgitation.  No evidence of mitral stenosis.  4. The aortic valve is tricuspid. Aortic valve regurgitation is mild. Mild to moderate aortic valve sclerosis/calcification is present, without any evidence of aortic stenosis.  5. Tricuspid regurgitation signal is inadequate for assessing PA pressure.  6. The inferior vena cava is normal in size with greater than 50% respiratory variability, suggesting right atrial pressure of 3 mmHg.  7. Technically difficult study with poor acoustic windows. FINDINGS  Left Ventricle: Left ventricular ejection fraction, by estimation, is 60 to 65%. The left ventricle has normal function. The left ventricle has no regional wall motion abnormalities. Right Ventricle: Tricuspid regurgitation signal is inadequate for assessing PA pressure. Left Atrium: Left atrial size was mildly dilated. Right Atrium: Right atrial size was normal in size. Pericardium: There is no evidence of pericardial effusion. Mitral Valve: The mitral valve is normal in structure and function. Mild to moderate mitral annular calcification. No evidence of mitral valve stenosis. Tricuspid Valve: The tricuspid valve is normal in structure. Tricuspid valve regurgitation is not demonstrated. Aortic Valve: The aortic valve is tricuspid. Aortic valve regurgitation is mild. Mild to moderate aortic valve sclerosis/calcification is  present, without any evidence of aortic stenosis. Pulmonic Valve: The pulmonic valve was normal in structure. Pulmonic valve regurgitation is trivial. Venous: The inferior vena cava is normal in size with greater than 50% respiratory variability, suggesting right atrial pressure of 3 mmHg. IAS/Shunts: No atrial level shunt detected by color flow Doppler. Loralie Champagne MD Electronically signed by Loralie Champagne MD Signature Date/Time: 02/12/2020/4:44:27 PM    Final    CT Maxillofacial WO CM  Result Date: 01/29/2020 CLINICAL DATA:  86 year old female status post fall striking head. On Eliquis. EXAM: CT MAXILLOFACIAL WITHOUT CONTRAST TECHNIQUE: Multidetector CT imaging of the maxillofacial structures was performed. Multiplanar CT image reconstructions were also generated. COMPARISON:  Head CT today reported separately. FINDINGS: Osseous: Osteopenia. Mandible intact. No zygoma fracture. Mildly displaced left nasal bone fracture on series 5, image 52. The anterior and posterior walls of the left maxillary sinus appear intact. No right maxilla fracture. Central skull base appears intact. There is a C2 cervical spine fracture. See the dedicated cervical spine CT today reported separately. Orbits: There is a left orbital floor fracture with mild herniation of fat and marginal herniation of the inferior rectus muscle. The remaining left orbital walls are intact. Contralateral right orbital walls are intact. Small volume intraorbital gas. No intraorbital hematoma. The left globe is intact. Superficial left periorbital hematoma or contusion. Sinuses: Hemorrhage layering in the left maxillary sinus. Other paranasal sinuses are well pneumatized. Tympanic cavities and mastoids are clear. Soft tissues: Superficial left face and periorbital injury stated above. Negative visible noncontrast larynx, pharynx, parapharyngeal spaces, retropharyngeal space, sublingual space, submandibular, masticator and parotid spaces. Limited  intracranial: Stable from the head CT reported separately today. IMPRESSION: 1. Left orbital floor fracture with mild herniation of fat and the inferior rectus muscle. Superficial periorbital hematoma/contusion but no intraorbital hematoma. 2. Mildly displaced left nasal bone fracture. 3. No other acute facial fracture identified. 4. C2 vertebral fracture - see cervical spine CT today reported separately. Electronically Signed   By: Genevie Ann M.D.   On: 01/29/2020 21:40    Microbiology: Recent Results (from the past 240 hour(s))  Urine culture     Status: Abnormal   Collection Time: 02/11/20  5:54 PM   Specimen: Urine, Random  Result Value Ref Range Status   Specimen Description URINE, RANDOM  Final   Special Requests NONE  Final   Culture (A)  Final    <10,000 COLONIES/mL INSIGNIFICANT GROWTH Performed at [facility], [place] [address], [place], [place] [address]    Report Status 02/12/2020 FINAL  Final  SARS CORONAVIRUS 2 (TAT 6-24 HRS) Nasopharyngeal Nasopharyngeal Swab     Status: None   Collection Time: 02/11/20  8:18 PM   Specimen: Nasopharyngeal Swab  Result Value Ref Range Status   SARS Coronavirus 2 NEGATIVE NEGATIVE Final    Comment: (NOTE) SARS-CoV-2 target nucleic acids are NOT DETECTED. The SARS-CoV-2 RNA is generally detectable in upper and lower respiratory specimens during the acute phase of infection. Negative results do not preclude SARS-CoV-2 infection, do not rule out co-infections with other pathogens, and should not be used as the sole basis for treatment or other patient management decisions. Negative results must be combined with clinical observations, patient history, and epidemiological information. The expected result is Negative. Fact Sheet for Patients: SugarRoll.be Fact Sheet for Healthcare Providers: https://www.woods-mathews.com/ This test is not yet approved or cleared by the [place] FDA and  has been  authorized for detection and/or diagnosis of SARS-CoV-2 by FDA under an Emergency Use Authorization (EUA). This EUA will remain  in effect (meaning this test can be used) for the duration of the COVID-19 declaration under Section 56 4(b)(1) of the Act, 21 U.S.C. section 360bbb-3(b)(1), unless the authorization is terminated or revoked sooner. Performed at [facility], [place] [address], [place], [place] [address]   Blood culture (routine x 2)     Status: Abnormal   Collection Time: 02/11/20  8:18 PM   Specimen: BLOOD RIGHT ARM  Result Value Ref Range Status   Specimen Description BLOOD RIGHT ARM  Final   Special Requests   Final    BOTTLES DRAWN AEROBIC AND ANAEROBIC Blood Culture adequate volume   Culture  Setup Time   Final    IN BOTH AEROBIC AND ANAEROBIC BOTTLES GRAM POSITIVE RODS CRITICAL RESULT CALLED TO, READ BACK BY AND VERIFIED WITH: K AMEND [facility] 02/13/20 0437 JDW Performed at [facility], [place] [address] [place], [place] [address]    Culture PROPRIONIBACTERIUM PROPRIONICUS (A)  Final   Report Status 02/15/2020 FINAL  Final  Blood culture (routine x 2)     Status: Abnormal   Collection Time: 02/11/20  8:18 PM   Specimen: BLOOD LEFT ARM  Result Value Ref Range Status   Specimen Description BLOOD LEFT ARM  Final   Special Requests   Final    BOTTLES DRAWN AEROBIC AND ANAEROBIC Blood Culture adequate volume   Culture  Setup Time   Final    AEROBIC BOTTLE ONLY GRAM POSITIVE RODS CRITICAL VALUE NOTED.  VALUE IS CONSISTENT WITH PREVIOUSLY REPORTED AND CALLED VALUE.    Culture (A)  Final    PROPIONIBACTERIUM ACNES CORYNEBACTERIUM SPECIES Standardized susceptibility testing for this organism is not available. Performed at [facility], [place] [address], [place], [place] [address]    Report Status 02/16/2020 FINAL  Final  SARS CORONAVIRUS 2 (TAT 6-24 HRS) Nasopharyngeal Nasopharyngeal Swab     Status: None   Collection Time: 02/14/20 12:59 PM   Specimen:  Nasopharyngeal Swab  Result Value Ref Range Status   SARS Coronavirus 2 NEGATIVE NEGATIVE Final    Comment: (NOTE) SARS-CoV-2 target nucleic acids are NOT DETECTED. The SARS-CoV-2 RNA is generally detectable in upper and lower respiratory specimens during the acute phase of infection. Negative results do not preclude SARS-CoV-2 infection, do not  rule out co-infections with other pathogens, and should not be used as the sole basis for treatment or other patient management decisions. Negative results must be combined with clinical observations, patient history, and epidemiological information. The expected result is Negative. Fact Sheet for Patients: SugarRoll.be Fact Sheet for Healthcare Providers: https://www.woods-mathews.com/ This test is not yet approved or cleared by the [place] FDA and  has been authorized for detection and/or diagnosis of SARS-CoV-2 by FDA under an Emergency Use Authorization (EUA). This EUA will remain  in effect (meaning this test can be used) for the duration of the COVID-19 declaration under Section 56 4(b)(1) of the Act, 21 U.S.C. section 360bbb-3(b)(1), unless the authorization is terminated or revoked sooner. Performed at [facility], [place] [address] [place], [place] [address]      Labs: Basic Metabolic Panel: Recent Labs  Lab 02/11/20 1506 02/12/20 0545 02/13/20 0248 02/14/20 0343 02/16/20 0215  NA 140 141 144 142 141  K 4.6 4.2 4.3 4.0 4.1  CL 107 107 109 108 108  CO2 25 23 24 24 23   GLUCOSE 111* 98 103* 96 98  BUN 31* 26* 27* 24* 23  CREATININE 1.55* 1.40* 1.50* 1.50* 1.38*  CALCIUM 8.9 8.8* 8.6* 8.4* 8.2*   Liver Function Tests: Recent Labs  Lab 02/11/20 1506  AST 17  ALT 15  ALKPHOS 75  BILITOT 0.6  PROT 6.1*  ALBUMIN 3.1*   No results for input(s): LIPASE, AMYLASE in the last 168 hours. No results for input(s): AMMONIA in the last 168 hours. CBC: Recent Labs  Lab  02/11/20 1506 02/12/20 0545 02/13/20 0248 02/14/20 0343 02/16/20 0215  WBC 8.5 7.4 6.9 6.6 5.9  NEUTROABS 6.3  --   --   --   --   HGB 10.7* 9.8* 9.2* 9.7* 9.9*  HCT 33.9* 30.4* 29.1* 30.2* 31.0*  MCV 102.1* 99.0 101.0* 100.0 99.7  PLT 267 260 265 290 292   Cardiac Enzymes: No results for input(s): CKTOTAL, CKMB, CKMBINDEX, TROPONINI in the last 168 hours. BNP: BNP (last 3 results) Recent Labs    05/07/19 1400 09/30/19 1054 02/11/20 1506  BNP 186.6* 270.5* 329.3*    ProBNP (last 3 results) No results for input(s): PROBNP in the last 8760 hours.  CBG: Recent Labs  Lab 02/11/20 1541  GLUCAP 93       Signed:  [facility] Pager: [phone] 02/16/2020, 11:12 AM

## 2020-02-16 NOTE — Progress Notes (Addendum)
Patient discharged to [facility] in [place] ([address]).  Report given to Bary Leriche, [place] at [phone].  Receiving RN to administer 1400 dose of amoxicillin.  AVS given with education provided to patient.  [facility]AR arrived for transport.  PIV not present, telemetry discontinued.

## 2020-02-23 DIAGNOSIS — C541 Malignant neoplasm of endometrium: Secondary | ICD-10-CM | POA: Diagnosis not present

## 2020-02-23 DIAGNOSIS — J984 Other disorders of lung: Secondary | ICD-10-CM | POA: Diagnosis not present

## 2020-02-23 DIAGNOSIS — G9340 Encephalopathy, unspecified: Secondary | ICD-10-CM | POA: Diagnosis not present

## 2020-02-23 DIAGNOSIS — N183 Chronic kidney disease, stage 3 unspecified: Secondary | ICD-10-CM | POA: Diagnosis not present

## 2020-02-23 DIAGNOSIS — I480 Paroxysmal atrial fibrillation: Secondary | ICD-10-CM | POA: Diagnosis not present

## 2020-02-23 DIAGNOSIS — R2681 Unsteadiness on feet: Secondary | ICD-10-CM | POA: Diagnosis not present

## 2020-02-23 DIAGNOSIS — I10 Essential (primary) hypertension: Secondary | ICD-10-CM | POA: Diagnosis not present

## 2020-02-23 DIAGNOSIS — E46 Unspecified protein-calorie malnutrition: Secondary | ICD-10-CM | POA: Diagnosis not present

## 2020-02-23 DIAGNOSIS — D519 Vitamin B12 deficiency anemia, unspecified: Secondary | ICD-10-CM | POA: Diagnosis not present

## 2020-02-24 ENCOUNTER — Other Ambulatory Visit (HOSPITAL_COMMUNITY): Payer: Medicare Other

## 2020-02-26 DIAGNOSIS — L89622 Pressure ulcer of left heel, stage 2: Secondary | ICD-10-CM | POA: Diagnosis not present

## 2020-02-27 ENCOUNTER — Other Ambulatory Visit: Payer: Self-pay | Admitting: *Deleted

## 2020-02-27 NOTE — Patient Outreach (Signed)
Screened for potential [facility] Care Management needs as a benefit of  [employer].  Christina Morgan is currently receiving skilled therapy at [facility].  Writer attended telephonic interdisciplinary team meeting to assess for disposition needs and transition plan for resident.   Facility reports member is from [place]. Reports transition plan will be to return to ALF.   Will continue to follow for transition plans while residing in SNF.    Christina Rolling, MSN-Ed, RN,BSN [place] Acute Care Coordinator [phone] [facility]) [phone]  (Toll free office)

## 2020-03-03 DIAGNOSIS — S12112A Nondisplaced Type II dens fracture, initial encounter for closed fracture: Secondary | ICD-10-CM | POA: Diagnosis not present

## 2020-03-03 DIAGNOSIS — M542 Cervicalgia: Secondary | ICD-10-CM | POA: Diagnosis not present

## 2020-03-04 DIAGNOSIS — L89622 Pressure ulcer of left heel, stage 2: Secondary | ICD-10-CM | POA: Diagnosis not present

## 2020-03-05 ENCOUNTER — Other Ambulatory Visit: Payer: Self-pay | Admitting: *Deleted

## 2020-03-05 NOTE — Patient Outreach (Signed)
Screened for potential [facility] Care Management needs as a benefit of  [employer].  Christina Morgan is currently receiving skilled therapy at [facility].   Writer attended telephonic interdisciplinary team meeting to assess for disposition needs and transition plan for resident.   Facility reports member will transition back to [facility] tomorrow 03/06/20.   No identifiable [facility] Care Management needs at this time.   Christina Rolling, MSN-Ed, RN,BSN [place] Acute Care Coordinator [phone] [facility]) [phone]  (Toll free office)

## 2020-03-08 DIAGNOSIS — D320 Benign neoplasm of cerebral meninges: Secondary | ICD-10-CM | POA: Diagnosis not present

## 2020-03-08 DIAGNOSIS — E441 Mild protein-calorie malnutrition: Secondary | ICD-10-CM | POA: Diagnosis not present

## 2020-03-08 DIAGNOSIS — I4819 Other persistent atrial fibrillation: Secondary | ICD-10-CM | POA: Diagnosis not present

## 2020-03-08 DIAGNOSIS — E538 Deficiency of other specified B group vitamins: Secondary | ICD-10-CM | POA: Diagnosis not present

## 2020-03-08 DIAGNOSIS — Z7901 Long term (current) use of anticoagulants: Secondary | ICD-10-CM | POA: Diagnosis not present

## 2020-03-08 DIAGNOSIS — I350 Nonrheumatic aortic (valve) stenosis: Secondary | ICD-10-CM | POA: Diagnosis not present

## 2020-03-08 DIAGNOSIS — G9340 Encephalopathy, unspecified: Secondary | ICD-10-CM | POA: Diagnosis not present

## 2020-03-08 DIAGNOSIS — Z8542 Personal history of malignant neoplasm of other parts of uterus: Secondary | ICD-10-CM | POA: Diagnosis not present

## 2020-03-08 DIAGNOSIS — N1831 Chronic kidney disease, stage 3a: Secondary | ICD-10-CM | POA: Diagnosis not present

## 2020-03-08 DIAGNOSIS — E782 Mixed hyperlipidemia: Secondary | ICD-10-CM | POA: Diagnosis not present

## 2020-03-08 DIAGNOSIS — I5032 Chronic diastolic (congestive) heart failure: Secondary | ICD-10-CM | POA: Diagnosis not present

## 2020-03-08 DIAGNOSIS — Z85828 Personal history of other malignant neoplasm of skin: Secondary | ICD-10-CM | POA: Diagnosis not present

## 2020-03-08 DIAGNOSIS — M8580 Other specified disorders of bone density and structure, unspecified site: Secondary | ICD-10-CM | POA: Diagnosis not present

## 2020-03-08 DIAGNOSIS — J189 Pneumonia, unspecified organism: Secondary | ICD-10-CM | POA: Diagnosis not present

## 2020-03-08 DIAGNOSIS — S12190D Other displaced fracture of second cervical vertebra, subsequent encounter for fracture with routine healing: Secondary | ICD-10-CM | POA: Diagnosis not present

## 2020-03-08 DIAGNOSIS — Z90710 Acquired absence of both cervix and uterus: Secondary | ICD-10-CM | POA: Diagnosis not present

## 2020-03-08 DIAGNOSIS — I130 Hypertensive heart and chronic kidney disease with heart failure and stage 1 through stage 4 chronic kidney disease, or unspecified chronic kidney disease: Secondary | ICD-10-CM | POA: Diagnosis not present

## 2020-03-08 DIAGNOSIS — D631 Anemia in chronic kidney disease: Secondary | ICD-10-CM | POA: Diagnosis not present

## 2020-03-08 DIAGNOSIS — E559 Vitamin D deficiency, unspecified: Secondary | ICD-10-CM | POA: Diagnosis not present

## 2020-03-08 DIAGNOSIS — I890 Lymphedema, not elsewhere classified: Secondary | ICD-10-CM | POA: Diagnosis not present

## 2020-03-08 DIAGNOSIS — S022XXD Fracture of nasal bones, subsequent encounter for fracture with routine healing: Secondary | ICD-10-CM | POA: Diagnosis not present

## 2020-03-08 DIAGNOSIS — M4854XD Collapsed vertebra, not elsewhere classified, thoracic region, subsequent encounter for fracture with routine healing: Secondary | ICD-10-CM | POA: Diagnosis not present

## 2020-03-08 DIAGNOSIS — S0232XD Fracture of orbital floor, left side, subsequent encounter for fracture with routine healing: Secondary | ICD-10-CM | POA: Diagnosis not present

## 2020-03-08 DIAGNOSIS — Z9181 History of falling: Secondary | ICD-10-CM | POA: Diagnosis not present

## 2020-03-08 DIAGNOSIS — L89622 Pressure ulcer of left heel, stage 2: Secondary | ICD-10-CM | POA: Diagnosis not present

## 2020-03-09 DIAGNOSIS — S0232XD Fracture of orbital floor, left side, subsequent encounter for fracture with routine healing: Secondary | ICD-10-CM | POA: Diagnosis not present

## 2020-03-09 DIAGNOSIS — D320 Benign neoplasm of cerebral meninges: Secondary | ICD-10-CM | POA: Diagnosis not present

## 2020-03-09 DIAGNOSIS — L89622 Pressure ulcer of left heel, stage 2: Secondary | ICD-10-CM | POA: Diagnosis not present

## 2020-03-09 DIAGNOSIS — S022XXD Fracture of nasal bones, subsequent encounter for fracture with routine healing: Secondary | ICD-10-CM | POA: Diagnosis not present

## 2020-03-09 DIAGNOSIS — S12190D Other displaced fracture of second cervical vertebra, subsequent encounter for fracture with routine healing: Secondary | ICD-10-CM | POA: Diagnosis not present

## 2020-03-09 DIAGNOSIS — M4854XD Collapsed vertebra, not elsewhere classified, thoracic region, subsequent encounter for fracture with routine healing: Secondary | ICD-10-CM | POA: Diagnosis not present

## 2020-03-10 DIAGNOSIS — S0232XD Fracture of orbital floor, left side, subsequent encounter for fracture with routine healing: Secondary | ICD-10-CM | POA: Diagnosis not present

## 2020-03-10 DIAGNOSIS — S12190D Other displaced fracture of second cervical vertebra, subsequent encounter for fracture with routine healing: Secondary | ICD-10-CM | POA: Diagnosis not present

## 2020-03-10 DIAGNOSIS — L89622 Pressure ulcer of left heel, stage 2: Secondary | ICD-10-CM | POA: Diagnosis not present

## 2020-03-10 DIAGNOSIS — S022XXD Fracture of nasal bones, subsequent encounter for fracture with routine healing: Secondary | ICD-10-CM | POA: Diagnosis not present

## 2020-03-10 DIAGNOSIS — M4854XD Collapsed vertebra, not elsewhere classified, thoracic region, subsequent encounter for fracture with routine healing: Secondary | ICD-10-CM | POA: Diagnosis not present

## 2020-03-10 DIAGNOSIS — D320 Benign neoplasm of cerebral meninges: Secondary | ICD-10-CM | POA: Diagnosis not present

## 2020-03-11 DIAGNOSIS — R269 Unspecified abnormalities of gait and mobility: Secondary | ICD-10-CM | POA: Diagnosis not present

## 2020-03-11 DIAGNOSIS — Z9989 Dependence on other enabling machines and devices: Secondary | ICD-10-CM | POA: Diagnosis not present

## 2020-03-11 DIAGNOSIS — I890 Lymphedema, not elsewhere classified: Secondary | ICD-10-CM | POA: Diagnosis not present

## 2020-03-11 DIAGNOSIS — Z03818 Encounter for observation for suspected exposure to other biological agents ruled out: Secondary | ICD-10-CM | POA: Diagnosis not present

## 2020-03-16 DIAGNOSIS — S12190D Other displaced fracture of second cervical vertebra, subsequent encounter for fracture with routine healing: Secondary | ICD-10-CM | POA: Diagnosis not present

## 2020-03-16 DIAGNOSIS — S022XXD Fracture of nasal bones, subsequent encounter for fracture with routine healing: Secondary | ICD-10-CM | POA: Diagnosis not present

## 2020-03-16 DIAGNOSIS — D320 Benign neoplasm of cerebral meninges: Secondary | ICD-10-CM | POA: Diagnosis not present

## 2020-03-16 DIAGNOSIS — S0232XD Fracture of orbital floor, left side, subsequent encounter for fracture with routine healing: Secondary | ICD-10-CM | POA: Diagnosis not present

## 2020-03-16 DIAGNOSIS — M4854XD Collapsed vertebra, not elsewhere classified, thoracic region, subsequent encounter for fracture with routine healing: Secondary | ICD-10-CM | POA: Diagnosis not present

## 2020-03-16 DIAGNOSIS — L89622 Pressure ulcer of left heel, stage 2: Secondary | ICD-10-CM | POA: Diagnosis not present

## 2020-03-17 DIAGNOSIS — S12190D Other displaced fracture of second cervical vertebra, subsequent encounter for fracture with routine healing: Secondary | ICD-10-CM | POA: Diagnosis not present

## 2020-03-17 DIAGNOSIS — M4854XD Collapsed vertebra, not elsewhere classified, thoracic region, subsequent encounter for fracture with routine healing: Secondary | ICD-10-CM | POA: Diagnosis not present

## 2020-03-17 DIAGNOSIS — S022XXD Fracture of nasal bones, subsequent encounter for fracture with routine healing: Secondary | ICD-10-CM | POA: Diagnosis not present

## 2020-03-17 DIAGNOSIS — L89622 Pressure ulcer of left heel, stage 2: Secondary | ICD-10-CM | POA: Diagnosis not present

## 2020-03-17 DIAGNOSIS — S0232XD Fracture of orbital floor, left side, subsequent encounter for fracture with routine healing: Secondary | ICD-10-CM | POA: Diagnosis not present

## 2020-03-17 DIAGNOSIS — D320 Benign neoplasm of cerebral meninges: Secondary | ICD-10-CM | POA: Diagnosis not present

## 2020-03-18 DIAGNOSIS — Z1159 Encounter for screening for other viral diseases: Secondary | ICD-10-CM | POA: Diagnosis not present

## 2020-03-18 DIAGNOSIS — Z20828 Contact with and (suspected) exposure to other viral communicable diseases: Secondary | ICD-10-CM | POA: Diagnosis not present

## 2020-03-19 DIAGNOSIS — S022XXD Fracture of nasal bones, subsequent encounter for fracture with routine healing: Secondary | ICD-10-CM | POA: Diagnosis not present

## 2020-03-19 DIAGNOSIS — S0232XD Fracture of orbital floor, left side, subsequent encounter for fracture with routine healing: Secondary | ICD-10-CM | POA: Diagnosis not present

## 2020-03-19 DIAGNOSIS — D320 Benign neoplasm of cerebral meninges: Secondary | ICD-10-CM | POA: Diagnosis not present

## 2020-03-19 DIAGNOSIS — S12190D Other displaced fracture of second cervical vertebra, subsequent encounter for fracture with routine healing: Secondary | ICD-10-CM | POA: Diagnosis not present

## 2020-03-19 DIAGNOSIS — L89622 Pressure ulcer of left heel, stage 2: Secondary | ICD-10-CM | POA: Diagnosis not present

## 2020-03-19 DIAGNOSIS — M4854XD Collapsed vertebra, not elsewhere classified, thoracic region, subsequent encounter for fracture with routine healing: Secondary | ICD-10-CM | POA: Diagnosis not present

## 2020-03-23 DIAGNOSIS — D320 Benign neoplasm of cerebral meninges: Secondary | ICD-10-CM | POA: Diagnosis not present

## 2020-03-23 DIAGNOSIS — S022XXD Fracture of nasal bones, subsequent encounter for fracture with routine healing: Secondary | ICD-10-CM | POA: Diagnosis not present

## 2020-03-23 DIAGNOSIS — M4854XD Collapsed vertebra, not elsewhere classified, thoracic region, subsequent encounter for fracture with routine healing: Secondary | ICD-10-CM | POA: Diagnosis not present

## 2020-03-23 DIAGNOSIS — S0232XD Fracture of orbital floor, left side, subsequent encounter for fracture with routine healing: Secondary | ICD-10-CM | POA: Diagnosis not present

## 2020-03-23 DIAGNOSIS — S12190D Other displaced fracture of second cervical vertebra, subsequent encounter for fracture with routine healing: Secondary | ICD-10-CM | POA: Diagnosis not present

## 2020-03-23 DIAGNOSIS — L89622 Pressure ulcer of left heel, stage 2: Secondary | ICD-10-CM | POA: Diagnosis not present

## 2020-03-24 DIAGNOSIS — S0232XD Fracture of orbital floor, left side, subsequent encounter for fracture with routine healing: Secondary | ICD-10-CM | POA: Diagnosis not present

## 2020-03-24 DIAGNOSIS — S12190D Other displaced fracture of second cervical vertebra, subsequent encounter for fracture with routine healing: Secondary | ICD-10-CM | POA: Diagnosis not present

## 2020-03-24 DIAGNOSIS — M4854XD Collapsed vertebra, not elsewhere classified, thoracic region, subsequent encounter for fracture with routine healing: Secondary | ICD-10-CM | POA: Diagnosis not present

## 2020-03-24 DIAGNOSIS — L89622 Pressure ulcer of left heel, stage 2: Secondary | ICD-10-CM | POA: Diagnosis not present

## 2020-03-24 DIAGNOSIS — D320 Benign neoplasm of cerebral meninges: Secondary | ICD-10-CM | POA: Diagnosis not present

## 2020-03-24 DIAGNOSIS — S022XXD Fracture of nasal bones, subsequent encounter for fracture with routine healing: Secondary | ICD-10-CM | POA: Diagnosis not present

## 2020-03-25 DIAGNOSIS — Z9189 Other specified personal risk factors, not elsewhere classified: Secondary | ICD-10-CM | POA: Diagnosis not present

## 2020-03-25 DIAGNOSIS — I890 Lymphedema, not elsewhere classified: Secondary | ICD-10-CM | POA: Diagnosis not present

## 2020-03-25 DIAGNOSIS — Z1159 Encounter for screening for other viral diseases: Secondary | ICD-10-CM | POA: Diagnosis not present

## 2020-03-25 DIAGNOSIS — I5032 Chronic diastolic (congestive) heart failure: Secondary | ICD-10-CM | POA: Diagnosis not present

## 2020-03-25 DIAGNOSIS — Z20828 Contact with and (suspected) exposure to other viral communicable diseases: Secondary | ICD-10-CM | POA: Diagnosis not present

## 2020-03-26 DIAGNOSIS — Z79899 Other long term (current) drug therapy: Secondary | ICD-10-CM | POA: Diagnosis not present

## 2020-03-26 DIAGNOSIS — L89622 Pressure ulcer of left heel, stage 2: Secondary | ICD-10-CM | POA: Diagnosis not present

## 2020-03-26 DIAGNOSIS — I890 Lymphedema, not elsewhere classified: Secondary | ICD-10-CM | POA: Diagnosis not present

## 2020-03-26 DIAGNOSIS — M4854XD Collapsed vertebra, not elsewhere classified, thoracic region, subsequent encounter for fracture with routine healing: Secondary | ICD-10-CM | POA: Diagnosis not present

## 2020-03-26 DIAGNOSIS — Z9989 Dependence on other enabling machines and devices: Secondary | ICD-10-CM | POA: Diagnosis not present

## 2020-03-26 DIAGNOSIS — S12190D Other displaced fracture of second cervical vertebra, subsequent encounter for fracture with routine healing: Secondary | ICD-10-CM | POA: Diagnosis not present

## 2020-03-26 DIAGNOSIS — S022XXD Fracture of nasal bones, subsequent encounter for fracture with routine healing: Secondary | ICD-10-CM | POA: Diagnosis not present

## 2020-03-26 DIAGNOSIS — S0232XD Fracture of orbital floor, left side, subsequent encounter for fracture with routine healing: Secondary | ICD-10-CM | POA: Diagnosis not present

## 2020-03-26 DIAGNOSIS — I10 Essential (primary) hypertension: Secondary | ICD-10-CM | POA: Diagnosis not present

## 2020-03-26 DIAGNOSIS — D320 Benign neoplasm of cerebral meninges: Secondary | ICD-10-CM | POA: Diagnosis not present

## 2020-03-30 ENCOUNTER — Ambulatory Visit: Payer: BLUE CROSS/BLUE SHIELD | Admitting: Cardiology

## 2020-03-30 DIAGNOSIS — S12190D Other displaced fracture of second cervical vertebra, subsequent encounter for fracture with routine healing: Secondary | ICD-10-CM | POA: Diagnosis not present

## 2020-03-30 DIAGNOSIS — M4854XD Collapsed vertebra, not elsewhere classified, thoracic region, subsequent encounter for fracture with routine healing: Secondary | ICD-10-CM | POA: Diagnosis not present

## 2020-03-30 DIAGNOSIS — S0232XD Fracture of orbital floor, left side, subsequent encounter for fracture with routine healing: Secondary | ICD-10-CM | POA: Diagnosis not present

## 2020-03-30 DIAGNOSIS — L89622 Pressure ulcer of left heel, stage 2: Secondary | ICD-10-CM | POA: Diagnosis not present

## 2020-03-30 DIAGNOSIS — S022XXD Fracture of nasal bones, subsequent encounter for fracture with routine healing: Secondary | ICD-10-CM | POA: Diagnosis not present

## 2020-03-30 DIAGNOSIS — D320 Benign neoplasm of cerebral meninges: Secondary | ICD-10-CM | POA: Diagnosis not present

## 2020-04-01 DIAGNOSIS — Z20828 Contact with and (suspected) exposure to other viral communicable diseases: Secondary | ICD-10-CM | POA: Diagnosis not present

## 2020-04-01 DIAGNOSIS — Z1159 Encounter for screening for other viral diseases: Secondary | ICD-10-CM | POA: Diagnosis not present

## 2020-04-02 DIAGNOSIS — M4854XD Collapsed vertebra, not elsewhere classified, thoracic region, subsequent encounter for fracture with routine healing: Secondary | ICD-10-CM | POA: Diagnosis not present

## 2020-04-02 DIAGNOSIS — S022XXD Fracture of nasal bones, subsequent encounter for fracture with routine healing: Secondary | ICD-10-CM | POA: Diagnosis not present

## 2020-04-02 DIAGNOSIS — L89622 Pressure ulcer of left heel, stage 2: Secondary | ICD-10-CM | POA: Diagnosis not present

## 2020-04-02 DIAGNOSIS — D320 Benign neoplasm of cerebral meninges: Secondary | ICD-10-CM | POA: Diagnosis not present

## 2020-04-02 DIAGNOSIS — S12190D Other displaced fracture of second cervical vertebra, subsequent encounter for fracture with routine healing: Secondary | ICD-10-CM | POA: Diagnosis not present

## 2020-04-02 DIAGNOSIS — S0232XD Fracture of orbital floor, left side, subsequent encounter for fracture with routine healing: Secondary | ICD-10-CM | POA: Diagnosis not present

## 2020-04-07 ENCOUNTER — Other Ambulatory Visit: Payer: Self-pay | Admitting: Neurosurgery

## 2020-04-07 DIAGNOSIS — S022XXD Fracture of nasal bones, subsequent encounter for fracture with routine healing: Secondary | ICD-10-CM | POA: Diagnosis not present

## 2020-04-07 DIAGNOSIS — E538 Deficiency of other specified B group vitamins: Secondary | ICD-10-CM | POA: Diagnosis not present

## 2020-04-07 DIAGNOSIS — I5032 Chronic diastolic (congestive) heart failure: Secondary | ICD-10-CM | POA: Diagnosis not present

## 2020-04-07 DIAGNOSIS — I350 Nonrheumatic aortic (valve) stenosis: Secondary | ICD-10-CM | POA: Diagnosis not present

## 2020-04-07 DIAGNOSIS — Z8542 Personal history of malignant neoplasm of other parts of uterus: Secondary | ICD-10-CM | POA: Diagnosis not present

## 2020-04-07 DIAGNOSIS — S12190D Other displaced fracture of second cervical vertebra, subsequent encounter for fracture with routine healing: Secondary | ICD-10-CM | POA: Diagnosis not present

## 2020-04-07 DIAGNOSIS — M4854XD Collapsed vertebra, not elsewhere classified, thoracic region, subsequent encounter for fracture with routine healing: Secondary | ICD-10-CM | POA: Diagnosis not present

## 2020-04-07 DIAGNOSIS — S12112A Nondisplaced Type II dens fracture, initial encounter for closed fracture: Secondary | ICD-10-CM

## 2020-04-07 DIAGNOSIS — Z7901 Long term (current) use of anticoagulants: Secondary | ICD-10-CM | POA: Diagnosis not present

## 2020-04-07 DIAGNOSIS — I4819 Other persistent atrial fibrillation: Secondary | ICD-10-CM | POA: Diagnosis not present

## 2020-04-07 DIAGNOSIS — I890 Lymphedema, not elsewhere classified: Secondary | ICD-10-CM | POA: Diagnosis not present

## 2020-04-07 DIAGNOSIS — L89622 Pressure ulcer of left heel, stage 2: Secondary | ICD-10-CM | POA: Diagnosis not present

## 2020-04-07 DIAGNOSIS — M8580 Other specified disorders of bone density and structure, unspecified site: Secondary | ICD-10-CM | POA: Diagnosis not present

## 2020-04-07 DIAGNOSIS — Z90710 Acquired absence of both cervix and uterus: Secondary | ICD-10-CM | POA: Diagnosis not present

## 2020-04-07 DIAGNOSIS — I130 Hypertensive heart and chronic kidney disease with heart failure and stage 1 through stage 4 chronic kidney disease, or unspecified chronic kidney disease: Secondary | ICD-10-CM | POA: Diagnosis not present

## 2020-04-07 DIAGNOSIS — J189 Pneumonia, unspecified organism: Secondary | ICD-10-CM | POA: Diagnosis not present

## 2020-04-07 DIAGNOSIS — E441 Mild protein-calorie malnutrition: Secondary | ICD-10-CM | POA: Diagnosis not present

## 2020-04-07 DIAGNOSIS — Z85828 Personal history of other malignant neoplasm of skin: Secondary | ICD-10-CM | POA: Diagnosis not present

## 2020-04-07 DIAGNOSIS — S0232XD Fracture of orbital floor, left side, subsequent encounter for fracture with routine healing: Secondary | ICD-10-CM | POA: Diagnosis not present

## 2020-04-07 DIAGNOSIS — D320 Benign neoplasm of cerebral meninges: Secondary | ICD-10-CM | POA: Diagnosis not present

## 2020-04-07 DIAGNOSIS — E559 Vitamin D deficiency, unspecified: Secondary | ICD-10-CM | POA: Diagnosis not present

## 2020-04-07 DIAGNOSIS — N1831 Chronic kidney disease, stage 3a: Secondary | ICD-10-CM | POA: Diagnosis not present

## 2020-04-07 DIAGNOSIS — E782 Mixed hyperlipidemia: Secondary | ICD-10-CM | POA: Diagnosis not present

## 2020-04-07 DIAGNOSIS — D631 Anemia in chronic kidney disease: Secondary | ICD-10-CM | POA: Diagnosis not present

## 2020-04-07 DIAGNOSIS — G9340 Encephalopathy, unspecified: Secondary | ICD-10-CM | POA: Diagnosis not present

## 2020-04-07 DIAGNOSIS — Z9181 History of falling: Secondary | ICD-10-CM | POA: Diagnosis not present

## 2020-04-08 DIAGNOSIS — D320 Benign neoplasm of cerebral meninges: Secondary | ICD-10-CM | POA: Diagnosis not present

## 2020-04-08 DIAGNOSIS — S022XXD Fracture of nasal bones, subsequent encounter for fracture with routine healing: Secondary | ICD-10-CM | POA: Diagnosis not present

## 2020-04-08 DIAGNOSIS — S12190D Other displaced fracture of second cervical vertebra, subsequent encounter for fracture with routine healing: Secondary | ICD-10-CM | POA: Diagnosis not present

## 2020-04-08 DIAGNOSIS — Z1159 Encounter for screening for other viral diseases: Secondary | ICD-10-CM | POA: Diagnosis not present

## 2020-04-08 DIAGNOSIS — L89622 Pressure ulcer of left heel, stage 2: Secondary | ICD-10-CM | POA: Diagnosis not present

## 2020-04-08 DIAGNOSIS — S0232XD Fracture of orbital floor, left side, subsequent encounter for fracture with routine healing: Secondary | ICD-10-CM | POA: Diagnosis not present

## 2020-04-08 DIAGNOSIS — Z20828 Contact with and (suspected) exposure to other viral communicable diseases: Secondary | ICD-10-CM | POA: Diagnosis not present

## 2020-04-08 DIAGNOSIS — M4854XD Collapsed vertebra, not elsewhere classified, thoracic region, subsequent encounter for fracture with routine healing: Secondary | ICD-10-CM | POA: Diagnosis not present

## 2020-04-09 ENCOUNTER — Encounter: Payer: Self-pay | Admitting: Cardiology

## 2020-04-09 ENCOUNTER — Other Ambulatory Visit: Payer: Self-pay

## 2020-04-09 ENCOUNTER — Ambulatory Visit (INDEPENDENT_AMBULATORY_CARE_PROVIDER_SITE_OTHER): Payer: Medicare Other | Admitting: Cardiology

## 2020-04-09 VITALS — BP 124/56 | HR 65 | Ht 67.0 in | Wt 178.2 lb

## 2020-04-09 DIAGNOSIS — N1831 Chronic kidney disease, stage 3a: Secondary | ICD-10-CM

## 2020-04-09 DIAGNOSIS — I5032 Chronic diastolic (congestive) heart failure: Secondary | ICD-10-CM | POA: Diagnosis not present

## 2020-04-09 DIAGNOSIS — R0602 Shortness of breath: Secondary | ICD-10-CM

## 2020-04-09 DIAGNOSIS — S0232XD Fracture of orbital floor, left side, subsequent encounter for fracture with routine healing: Secondary | ICD-10-CM | POA: Diagnosis not present

## 2020-04-09 DIAGNOSIS — S022XXD Fracture of nasal bones, subsequent encounter for fracture with routine healing: Secondary | ICD-10-CM | POA: Diagnosis not present

## 2020-04-09 DIAGNOSIS — I10 Essential (primary) hypertension: Secondary | ICD-10-CM | POA: Diagnosis not present

## 2020-04-09 DIAGNOSIS — R600 Localized edema: Secondary | ICD-10-CM | POA: Diagnosis not present

## 2020-04-09 DIAGNOSIS — S12190D Other displaced fracture of second cervical vertebra, subsequent encounter for fracture with routine healing: Secondary | ICD-10-CM | POA: Diagnosis not present

## 2020-04-09 DIAGNOSIS — D320 Benign neoplasm of cerebral meninges: Secondary | ICD-10-CM | POA: Diagnosis not present

## 2020-04-09 DIAGNOSIS — M4854XD Collapsed vertebra, not elsewhere classified, thoracic region, subsequent encounter for fracture with routine healing: Secondary | ICD-10-CM | POA: Diagnosis not present

## 2020-04-09 DIAGNOSIS — I4819 Other persistent atrial fibrillation: Secondary | ICD-10-CM | POA: Diagnosis not present

## 2020-04-09 DIAGNOSIS — L89622 Pressure ulcer of left heel, stage 2: Secondary | ICD-10-CM | POA: Diagnosis not present

## 2020-04-09 NOTE — Patient Instructions (Signed)
Medication Instructions:  Your physician recommends that you continue on your current medications as directed. Please refer to the Current Medication list given to you today.  *If you need a refill on your cardiac medications before your next appointment, please call your pharmacy*  Follow-Up: At [facility], you and your health needs are our priority.  As part of our continuing mission to provide you with exceptional heart care, we have created designated Provider Care Teams.  These Care Teams include your primary Cardiologist (physician) and Advanced Practice Providers (APPs -  Physician Assistants and Nurse Practitioners) who all work together to provide you with the care you need, when you need it.  We recommend signing up for the patient portal called "MyChart".  Sign up information is provided on this After Visit Summary.  MyChart is used to connect with patients for Virtual Visits (Telemedicine).  Patients are able to view lab/test results, encounter notes, upcoming appointments, etc.  Non-urgent messages can be sent to your provider as well.   To learn more about what you can do with MyChart, go to [email or web address].    Your next appointment:   6 month(s)  The format for your next appointment:   In Person  Provider:   You may see Traci Turner, MD or one of the following Advanced Practice Providers on your designated Care Team:    Dayna Dunn, PA-C  Michele Lenze, PA-C   

## 2020-04-09 NOTE — Progress Notes (Signed)
Cardiology Office Note:    Date:  04/09/2020   ID:  Christina Morgan, DOB 01/06/1933, MRN [id number]  PCP:  Patient, No Pcp Per  Cardiologist:  Fransico Him, MD    Referring MD: Sueanne Margarita, MD   Chief Complaint  Patient presents with  . Atrial Fibrillation  . Hypertension  . Hyperlipidemia    History of Present Illness:    Christina Morgan is a 86 y.o. female with a hx of  paroxysmal atrial fibrillation on Eliquis and flecainide, hypertension, hyperlipidemia, and chronic lower extremity edema. Shealso has a history of chronic diastolic CHF withEchoshowingLVEF 55-60% with elevated ventricular filling pressures and atrial filling pressures, trivial pericardial effusionand moderate aortic insufficiency.She also haschronic lower extremity edema with chronic venous insufficiency exacerbated by higher doses of amlodipine and sitting all day.She has chronic DOE that is felt to be due to sedentary state and deconditioned.   She is here today for followup and is doing well.  She is here with her daughter today.  Since I saw her last she has had a series of falls resulting in hip fx and has since moved to [facility].  She then had another fall because she did not use her walker when she went to the bathroom and fell and fx her C2 spine and is now in a neck collar.  Her daughter says that she has not had any other falls and has to call for help if she needs to get up as they will not let her ambulate on her own.  She denies any chest pain or pressure, SOB, DOE, PND, orthopnea,  dizziness, palpitations or syncope. She has chronic LE edema with oozing and the nurse wanted to start wrapping her legs as the compression hose are not helping and she cannot lay back in her recliner due to her neck so her feet are chronically dependent.  She is compliant with her meds and is tolerating meds with no SE.    Past Medical History:  Diagnosis Date  . Cancer [facility])     endometrial ca  . Chronic diastolic CHF (congestive heart failure) ([place])   . Chronic edema   . CKD (chronic kidney disease), stage III   . Dyslipidemia   . Fracture of left humerus   . History of cardiovascular stress test    Lexiscan Myoview (06/2014): No ischemia or scar, not gated, low risk  . Hx of echocardiogram    Echo (05/02/14): EF 60% to 65%. No regional wall motion abnormalities. Mild AI, mildly dilated aortic root (37 mm), trivial MR, trivial TR  . Hyperlipidemia   . Hypertension   . Lipoma of skin    multiple  . Osteopenia   . PAF (paroxysmal atrial fibrillation) ([place])    failed DCCV 05/2014 >> Flecainide started >> DCCV 7/15 sucessful;  f/u ETT neg for pro-arrhythmia >> recurrent AF/AFL >> Flecainide inc to 100 bid with repeat DCCV 9/15  . Vitamin D deficiency 09/25/2019    Past Surgical History:  Procedure Laterality Date  . CARDIOVERSION N/A 06/05/2014   Procedure: CARDIOVERSION;  Surgeon: Sueanne Margarita, MD;  Location: [place];  Service: Cardiovascular;  Laterality: N/A;  . CARDIOVERSION N/A 07/11/2014   Procedure: CARDIOVERSION;  Surgeon: Sueanne Margarita, MD;  Location: [facility] ENDOSCOPY;  Service: Cardiovascular;  Laterality: N/A;  . CARDIOVERSION N/A 09/04/2014   Procedure: CARDIOVERSION;  Surgeon: Sueanne Margarita, MD;  Location: [facility] ENDOSCOPY;  Service: Cardiovascular;  Laterality: N/A;  . COLONOSCOPY  2008  . COLONOSCOPY N/A 05/20/2015   Procedure: COLONOSCOPY;  Surgeon: Ronald Lobo, MD;  Location: [facility] ENDOSCOPY;  Service: Endoscopy;  Laterality: N/A;  . FEMUR IM NAIL Right 09/24/2019   Procedure: INTRAMEDULLARY (IM) NAIL FEMORAL;  Surgeon: Altamese Bethel, MD;  Location: [place];  Service: Orthopedics;  Laterality: Right;  . IR KYPHO EA ADDL LEVEL THORACIC OR LUMBAR  04/12/2019  . IR KYPHO THORACIC WITH BONE BIOPSY  04/12/2019  . ROBOTIC ASSISTED SUPRACERVICAL HYSTERECTOMY WITH BILATERAL SALPINGO OOPHERECTOMY  11/06/2012   and bilateral pelvic lymph node dissection     Current Medications: Current Meds  Medication Sig  . acetaminophen (TYLENOL) 325 MG tablet Take 1-2 tablets (325-650 mg total) by mouth every 6 (six) hours as needed for mild pain (pain score 1-3 or temp > 100.5).  Marland Kitchen albuterol (VENTOLIN HFA) 108 (90 Base) MCG/ACT inhaler Inhale 2 puffs into the lungs every 4 (four) hours as needed for wheezing or shortness of breath (please try to use ALbuterol inhaler PRIOR to [place]).  Marland Kitchen apixaban (ELIQUIS) 2.5 MG TABS tablet Take 2.5 mg by mouth 2 (two) times daily.  Marland Kitchen atenolol (TENORMIN) 25 MG tablet Take 1 tablet (25 mg total) by mouth 2 (two) times daily.  Marland Kitchen atorvastatin (LIPITOR) 10 MG tablet Take 1 tablet (10 mg total) by mouth at bedtime.  Marland Kitchen [place] Saline Nasal No-Drip GEL Place 1 application into both nostrils 2 (two) times daily.  . Cholecalciferol (VITAMIN D3) 125 MCG (5000 UT) CAPS Take 5,000 Units by mouth daily.  Marland Kitchen docusate sodium (COLACE) 100 MG capsule Take 1 capsule (100 mg total) by mouth 2 (two) times daily. (Patient taking differently: Take 100 mg by mouth daily as needed for mild constipation. )  . flecainide (TAMBOCOR) 100 MG tablet Take 1 tablet (100 mg total) by mouth 2 (two) times daily.  . hydrALAZINE (APRESOLINE) 25 MG tablet Take 1 tablet (25 mg total) by mouth 2 (two) times daily.  . megestrol (MEGACE) 40 MG tablet Take 80 mg by mouth 2 (two) times daily.   . NYSTATIN powder Apply 1 g topically See admin instructions. Spread topically to area under bilateral breasts & groin twice a day at 8 AM and 8 PM  . potassium chloride SA (K-DUR) 20 MEQ tablet Take 1 tablet (20 mEq total) by mouth 2 (two) times daily.  . sodium chloride (OCEAN) 0.65 % SOLN nasal spray Place 1 spray into both nostrils 4 (four) times daily.  . tamoxifen (NOLVADEX) 20 MG tablet Take 20 mg by mouth daily.  Marland Kitchen torsemide (DEMADEX) 10 MG tablet Take 1 tablet (10 mg total) by mouth daily.  . vitamin C (VITAMIN C) 500 MG tablet Take 1 tablet (500 mg total) by mouth daily.   . Vitamin D, Ergocalciferol, (DRISDOL) 1.25 MG (50000 UT) CAPS capsule Take 1 capsule (50,000 Units total) by mouth every 7 (seven) days.     Allergies:   Penicillins   Social History   Socioeconomic History  . Marital status: Widowed    Spouse name: Not on file  . Number of children: Not on file  . Years of education: Not on file  . Highest education level: Not on file  Occupational History  . Not on file  Tobacco Use  . Smoking status: Never Smoker  . Smokeless tobacco: Never Used  Substance and Sexual Activity  . Alcohol use: No  . Drug use: No  . Sexual activity: Not on file  Other Topics Concern  . Not on file  Social History Narrative   ** Merged History Encounter **       Social Determinants of Health   Financial Resource Strain: Low Risk   . Difficulty of Paying Living Expenses: Not hard at all  Food Insecurity: Unknown  . Worried About Charity fundraiser in the Last Year: Patient refused  . Ran Out of Food in the Last Year: Patient refused  Transportation Needs: Unknown  . Lack of Transportation (Medical): Patient refused  . Lack of Transportation (Non-Medical): Patient refused  Physical Activity: Inactive  . Days of Exercise per Week: 0 days  . Minutes of Exercise per Session: 0 min  Stress: No Stress Concern Present  . Feeling of Stress : Only a little  Social Connections: Slightly Isolated  . Frequency of Communication with Friends and Family: More than three times a week  . Frequency of Social Gatherings with Friends and Family: More than three times a week  . Attends Religious Services: More than 4 times per year  . Active Member of Clubs or Organizations: Yes  . Attends Archivist Meetings: 1 to 4 times per year  . Marital Status: Widowed     Family History: The patient's family history includes Cancer (age of onset: 14) in her father; Coronary artery disease in her mother; Heart attack in her mother.  ROS:   Please see the history  of present illness.    ROS  All other systems reviewed and negative.   EKGs/Labs/Other Studies Reviewed:    The following studies were reviewed today: nond  EKG:  EKG is not  ordered today.    Recent Labs: 02/11/2020: ALT 15; B Natriuretic Peptide 329.3 02/12/2020: TSH 2.620 02/16/2020: BUN 23; Creatinine, Ser 1.38; Hemoglobin 9.9; Platelets 292; Potassium 4.1; Sodium 141   Recent Lipid Panel    Component Value Date/Time   CHOL 175 03/21/2016 0915   TRIG 155 (H) 03/21/2016 0915   HDL 63 03/21/2016 0915   CHOLHDL 2.8 03/21/2016 0915   VLDL 31 (H) 03/21/2016 0915   LDLCALC 81 03/21/2016 0915    Physical Exam:    VS:  BP (!) 124/56   Pulse 65   Ht 5\' 7"  (1.702 m)   Wt 178 lb 3.2 oz (80.8 kg)   SpO2 99%   BMI 27.91 kg/m     Wt Readings from Last 3 Encounters:  04/09/20 178 lb 3.2 oz (80.8 kg)  02/12/20 172 lb 13.5 oz (78.4 kg)  01/29/20 171 lb (77.6 kg)     GEN:  Well nourished, well developed in no acute distress HEENT: Normal NECK: No JVD; No carotid bruits LYMPHATICS: No lymphadenopathy CARDIAC: RRR, no murmurs, rubs, gallops RESPIRATORY:  Clear to auscultation without rales, wheezing or rhonchi  ABDOMEN: Soft, non-tender, non-distended MUSCULOSKELETAL:  No edema; No deformity  SKIN: Warm and dry NEUROLOGIC:  Alert and oriented x 3 PSYCHIATRIC:  Normal affect   ASSESSMENT:    1. Chronic diastolic heart failure (HCC)   2. Persistent atrial fibrillation ([place])   3. Essential hypertension, benign   4. Edema of extremities   5. SOB (shortness of breath)   6. Stage 3a chronic kidney disease    PLAN:    In order of problems listed above:  1.  Chronic diastolic CHF -2D echo with normal LVF and diastolic dysfunction -now on Torsemide 10mg  daily  -has chronic LE edema secondary to chronic venous insuff and chronic dependent state -keep on Torsemide 10mg  daily - I do not want to  increase this further due to CKD and problems with low Bp on higher doses of  diuretics.  -creatinine stableat 1.38 and K+ 4.1 last month -continue <2gm Na diet  2.  Persistent atrial fibrillation -she is maintaining NSR on exam today -continue Atenolol 25mg  BID, Flecainide 100mg  BID and Apixaban 2.5mg  BID (dosed for age > 70 and Creatinine which is usually > 1.5) -I am concerned about her falling but she is now in Assisted Living -I asked her daughter to let me know if she has any further falls and we may need to reassess long term anticoagulation  3.  HTN -BP controlled on exam today -continue Atenolol 25mg  BID and Lisinopril 5mg  daily  4.  Chronic LE edema -exacerbated by dietary indiscretion with Na as well as chronic venous insuff and sitting all day with legs hanging down -appears to have worsened and now oozing -continue with compression hose and low dose diuretic -ok to wrap legs as needed  5.  Chronic DOE -2D echo 01/2019 showed normal LVF with increased stiffness of heart muscle, severe LAE -lexiscan myoview 12/2018 showed no ischemia -felt to be multifactorial from obesity, sedentary state with deconditioning -her SOB has not changed since last OV  6.  CKD stage 3a -creatinine stable at 1.34 -followed by PCP   Medication Adjustments/Labs and Tests Ordered: Current medicines are reviewed at length with the patient today.  Concerns regarding medicines are outlined above.  No orders of the defined types were placed in this encounter.  No orders of the defined types were placed in this encounter.   Signed, Fransico Him, MD  04/09/2020 9:53 AM    [place]

## 2020-04-13 DIAGNOSIS — S12190D Other displaced fracture of second cervical vertebra, subsequent encounter for fracture with routine healing: Secondary | ICD-10-CM | POA: Diagnosis not present

## 2020-04-13 DIAGNOSIS — S0232XD Fracture of orbital floor, left side, subsequent encounter for fracture with routine healing: Secondary | ICD-10-CM | POA: Diagnosis not present

## 2020-04-13 DIAGNOSIS — D320 Benign neoplasm of cerebral meninges: Secondary | ICD-10-CM | POA: Diagnosis not present

## 2020-04-13 DIAGNOSIS — L89622 Pressure ulcer of left heel, stage 2: Secondary | ICD-10-CM | POA: Diagnosis not present

## 2020-04-13 DIAGNOSIS — M4854XD Collapsed vertebra, not elsewhere classified, thoracic region, subsequent encounter for fracture with routine healing: Secondary | ICD-10-CM | POA: Diagnosis not present

## 2020-04-13 DIAGNOSIS — S022XXD Fracture of nasal bones, subsequent encounter for fracture with routine healing: Secondary | ICD-10-CM | POA: Diagnosis not present

## 2020-04-15 DIAGNOSIS — Z20828 Contact with and (suspected) exposure to other viral communicable diseases: Secondary | ICD-10-CM | POA: Diagnosis not present

## 2020-04-15 DIAGNOSIS — Z1159 Encounter for screening for other viral diseases: Secondary | ICD-10-CM | POA: Diagnosis not present

## 2020-04-16 DIAGNOSIS — S12190D Other displaced fracture of second cervical vertebra, subsequent encounter for fracture with routine healing: Secondary | ICD-10-CM | POA: Diagnosis not present

## 2020-04-16 DIAGNOSIS — L89622 Pressure ulcer of left heel, stage 2: Secondary | ICD-10-CM | POA: Diagnosis not present

## 2020-04-16 DIAGNOSIS — M4854XD Collapsed vertebra, not elsewhere classified, thoracic region, subsequent encounter for fracture with routine healing: Secondary | ICD-10-CM | POA: Diagnosis not present

## 2020-04-16 DIAGNOSIS — D320 Benign neoplasm of cerebral meninges: Secondary | ICD-10-CM | POA: Diagnosis not present

## 2020-04-16 DIAGNOSIS — S0232XD Fracture of orbital floor, left side, subsequent encounter for fracture with routine healing: Secondary | ICD-10-CM | POA: Diagnosis not present

## 2020-04-16 DIAGNOSIS — S022XXD Fracture of nasal bones, subsequent encounter for fracture with routine healing: Secondary | ICD-10-CM | POA: Diagnosis not present

## 2020-04-20 DIAGNOSIS — S12190D Other displaced fracture of second cervical vertebra, subsequent encounter for fracture with routine healing: Secondary | ICD-10-CM | POA: Diagnosis not present

## 2020-04-20 DIAGNOSIS — M4854XD Collapsed vertebra, not elsewhere classified, thoracic region, subsequent encounter for fracture with routine healing: Secondary | ICD-10-CM | POA: Diagnosis not present

## 2020-04-20 DIAGNOSIS — L89622 Pressure ulcer of left heel, stage 2: Secondary | ICD-10-CM | POA: Diagnosis not present

## 2020-04-20 DIAGNOSIS — S0232XD Fracture of orbital floor, left side, subsequent encounter for fracture with routine healing: Secondary | ICD-10-CM | POA: Diagnosis not present

## 2020-04-20 DIAGNOSIS — S022XXD Fracture of nasal bones, subsequent encounter for fracture with routine healing: Secondary | ICD-10-CM | POA: Diagnosis not present

## 2020-04-20 DIAGNOSIS — D320 Benign neoplasm of cerebral meninges: Secondary | ICD-10-CM | POA: Diagnosis not present

## 2020-04-21 DIAGNOSIS — L89622 Pressure ulcer of left heel, stage 2: Secondary | ICD-10-CM | POA: Diagnosis not present

## 2020-04-21 DIAGNOSIS — S022XXD Fracture of nasal bones, subsequent encounter for fracture with routine healing: Secondary | ICD-10-CM | POA: Diagnosis not present

## 2020-04-21 DIAGNOSIS — S0232XD Fracture of orbital floor, left side, subsequent encounter for fracture with routine healing: Secondary | ICD-10-CM | POA: Diagnosis not present

## 2020-04-22 DIAGNOSIS — S12190D Other displaced fracture of second cervical vertebra, subsequent encounter for fracture with routine healing: Secondary | ICD-10-CM | POA: Diagnosis not present

## 2020-04-22 DIAGNOSIS — S022XXD Fracture of nasal bones, subsequent encounter for fracture with routine healing: Secondary | ICD-10-CM | POA: Diagnosis not present

## 2020-04-22 DIAGNOSIS — I10 Essential (primary) hypertension: Secondary | ICD-10-CM | POA: Diagnosis not present

## 2020-04-22 DIAGNOSIS — D320 Benign neoplasm of cerebral meninges: Secondary | ICD-10-CM | POA: Diagnosis not present

## 2020-04-22 DIAGNOSIS — Z9989 Dependence on other enabling machines and devices: Secondary | ICD-10-CM | POA: Diagnosis not present

## 2020-04-22 DIAGNOSIS — Z1159 Encounter for screening for other viral diseases: Secondary | ICD-10-CM | POA: Diagnosis not present

## 2020-04-22 DIAGNOSIS — S0232XD Fracture of orbital floor, left side, subsequent encounter for fracture with routine healing: Secondary | ICD-10-CM | POA: Diagnosis not present

## 2020-04-22 DIAGNOSIS — L89622 Pressure ulcer of left heel, stage 2: Secondary | ICD-10-CM | POA: Diagnosis not present

## 2020-04-22 DIAGNOSIS — R269 Unspecified abnormalities of gait and mobility: Secondary | ICD-10-CM | POA: Diagnosis not present

## 2020-04-22 DIAGNOSIS — I890 Lymphedema, not elsewhere classified: Secondary | ICD-10-CM | POA: Diagnosis not present

## 2020-04-22 DIAGNOSIS — Z20828 Contact with and (suspected) exposure to other viral communicable diseases: Secondary | ICD-10-CM | POA: Diagnosis not present

## 2020-04-22 DIAGNOSIS — M4854XD Collapsed vertebra, not elsewhere classified, thoracic region, subsequent encounter for fracture with routine healing: Secondary | ICD-10-CM | POA: Diagnosis not present

## 2020-04-23 DIAGNOSIS — M4854XD Collapsed vertebra, not elsewhere classified, thoracic region, subsequent encounter for fracture with routine healing: Secondary | ICD-10-CM | POA: Diagnosis not present

## 2020-04-23 DIAGNOSIS — S0232XD Fracture of orbital floor, left side, subsequent encounter for fracture with routine healing: Secondary | ICD-10-CM | POA: Diagnosis not present

## 2020-04-23 DIAGNOSIS — S022XXD Fracture of nasal bones, subsequent encounter for fracture with routine healing: Secondary | ICD-10-CM | POA: Diagnosis not present

## 2020-04-23 DIAGNOSIS — S12190D Other displaced fracture of second cervical vertebra, subsequent encounter for fracture with routine healing: Secondary | ICD-10-CM | POA: Diagnosis not present

## 2020-04-23 DIAGNOSIS — L89622 Pressure ulcer of left heel, stage 2: Secondary | ICD-10-CM | POA: Diagnosis not present

## 2020-04-23 DIAGNOSIS — D320 Benign neoplasm of cerebral meninges: Secondary | ICD-10-CM | POA: Diagnosis not present

## 2020-04-27 DIAGNOSIS — S12190D Other displaced fracture of second cervical vertebra, subsequent encounter for fracture with routine healing: Secondary | ICD-10-CM | POA: Diagnosis not present

## 2020-04-27 DIAGNOSIS — S0232XD Fracture of orbital floor, left side, subsequent encounter for fracture with routine healing: Secondary | ICD-10-CM | POA: Diagnosis not present

## 2020-04-27 DIAGNOSIS — L89622 Pressure ulcer of left heel, stage 2: Secondary | ICD-10-CM | POA: Diagnosis not present

## 2020-04-27 DIAGNOSIS — M4854XD Collapsed vertebra, not elsewhere classified, thoracic region, subsequent encounter for fracture with routine healing: Secondary | ICD-10-CM | POA: Diagnosis not present

## 2020-04-27 DIAGNOSIS — S022XXD Fracture of nasal bones, subsequent encounter for fracture with routine healing: Secondary | ICD-10-CM | POA: Diagnosis not present

## 2020-04-27 DIAGNOSIS — D320 Benign neoplasm of cerebral meninges: Secondary | ICD-10-CM | POA: Diagnosis not present

## 2020-04-28 ENCOUNTER — Other Ambulatory Visit: Payer: Self-pay

## 2020-04-28 ENCOUNTER — Other Ambulatory Visit: Payer: BLUE CROSS/BLUE SHIELD

## 2020-04-28 ENCOUNTER — Ambulatory Visit
Admission: RE | Admit: 2020-04-28 | Discharge: 2020-04-28 | Disposition: A | Discharge status: Home / Self Care | Payer: Medicare Other | Source: Ambulatory Visit | Attending: Neurosurgery | Admitting: Neurosurgery

## 2020-04-28 DIAGNOSIS — S12112A Nondisplaced Type II dens fracture, initial encounter for closed fracture: Secondary | ICD-10-CM

## 2020-04-28 DIAGNOSIS — S12110K Anterior displaced Type II dens fracture, subsequent encounter for fracture with nonunion: Secondary | ICD-10-CM | POA: Diagnosis not present

## 2020-04-29 DIAGNOSIS — Z1159 Encounter for screening for other viral diseases: Secondary | ICD-10-CM | POA: Diagnosis not present

## 2020-04-29 DIAGNOSIS — Z20828 Contact with and (suspected) exposure to other viral communicable diseases: Secondary | ICD-10-CM | POA: Diagnosis not present

## 2020-04-30 DIAGNOSIS — S0232XD Fracture of orbital floor, left side, subsequent encounter for fracture with routine healing: Secondary | ICD-10-CM | POA: Diagnosis not present

## 2020-04-30 DIAGNOSIS — L89622 Pressure ulcer of left heel, stage 2: Secondary | ICD-10-CM | POA: Diagnosis not present

## 2020-04-30 DIAGNOSIS — S022XXD Fracture of nasal bones, subsequent encounter for fracture with routine healing: Secondary | ICD-10-CM | POA: Diagnosis not present

## 2020-04-30 DIAGNOSIS — D320 Benign neoplasm of cerebral meninges: Secondary | ICD-10-CM | POA: Diagnosis not present

## 2020-04-30 DIAGNOSIS — S12190D Other displaced fracture of second cervical vertebra, subsequent encounter for fracture with routine healing: Secondary | ICD-10-CM | POA: Diagnosis not present

## 2020-04-30 DIAGNOSIS — M4854XD Collapsed vertebra, not elsewhere classified, thoracic region, subsequent encounter for fracture with routine healing: Secondary | ICD-10-CM | POA: Diagnosis not present

## 2020-05-03 DIAGNOSIS — S022XXD Fracture of nasal bones, subsequent encounter for fracture with routine healing: Secondary | ICD-10-CM | POA: Diagnosis not present

## 2020-05-03 DIAGNOSIS — S0232XD Fracture of orbital floor, left side, subsequent encounter for fracture with routine healing: Secondary | ICD-10-CM | POA: Diagnosis not present

## 2020-05-03 DIAGNOSIS — L89622 Pressure ulcer of left heel, stage 2: Secondary | ICD-10-CM | POA: Diagnosis not present

## 2020-05-03 DIAGNOSIS — M4854XD Collapsed vertebra, not elsewhere classified, thoracic region, subsequent encounter for fracture with routine healing: Secondary | ICD-10-CM | POA: Diagnosis not present

## 2020-05-03 DIAGNOSIS — S12190D Other displaced fracture of second cervical vertebra, subsequent encounter for fracture with routine healing: Secondary | ICD-10-CM | POA: Diagnosis not present

## 2020-05-03 DIAGNOSIS — D320 Benign neoplasm of cerebral meninges: Secondary | ICD-10-CM | POA: Diagnosis not present

## 2020-05-04 DIAGNOSIS — Z79899 Other long term (current) drug therapy: Secondary | ICD-10-CM | POA: Diagnosis not present

## 2020-05-04 DIAGNOSIS — N390 Urinary tract infection, site not specified: Secondary | ICD-10-CM | POA: Diagnosis not present

## 2020-05-05 DIAGNOSIS — S12112A Nondisplaced Type II dens fracture, initial encounter for closed fracture: Secondary | ICD-10-CM | POA: Diagnosis not present

## 2020-05-05 DIAGNOSIS — D320 Benign neoplasm of cerebral meninges: Secondary | ICD-10-CM | POA: Diagnosis not present

## 2020-05-05 DIAGNOSIS — S12190D Other displaced fracture of second cervical vertebra, subsequent encounter for fracture with routine healing: Secondary | ICD-10-CM | POA: Diagnosis not present

## 2020-05-05 DIAGNOSIS — L89622 Pressure ulcer of left heel, stage 2: Secondary | ICD-10-CM | POA: Diagnosis not present

## 2020-05-05 DIAGNOSIS — S022XXD Fracture of nasal bones, subsequent encounter for fracture with routine healing: Secondary | ICD-10-CM | POA: Diagnosis not present

## 2020-05-05 DIAGNOSIS — S0232XD Fracture of orbital floor, left side, subsequent encounter for fracture with routine healing: Secondary | ICD-10-CM | POA: Diagnosis not present

## 2020-05-05 DIAGNOSIS — M4854XD Collapsed vertebra, not elsewhere classified, thoracic region, subsequent encounter for fracture with routine healing: Secondary | ICD-10-CM | POA: Diagnosis not present

## 2020-05-06 DIAGNOSIS — N390 Urinary tract infection, site not specified: Secondary | ICD-10-CM | POA: Diagnosis not present

## 2020-05-06 DIAGNOSIS — Z1159 Encounter for screening for other viral diseases: Secondary | ICD-10-CM | POA: Diagnosis not present

## 2020-05-06 DIAGNOSIS — Z20828 Contact with and (suspected) exposure to other viral communicable diseases: Secondary | ICD-10-CM | POA: Diagnosis not present

## 2020-05-07 DIAGNOSIS — E782 Mixed hyperlipidemia: Secondary | ICD-10-CM | POA: Diagnosis not present

## 2020-05-07 DIAGNOSIS — I350 Nonrheumatic aortic (valve) stenosis: Secondary | ICD-10-CM | POA: Diagnosis not present

## 2020-05-07 DIAGNOSIS — I890 Lymphedema, not elsewhere classified: Secondary | ICD-10-CM | POA: Diagnosis not present

## 2020-05-07 DIAGNOSIS — Z7901 Long term (current) use of anticoagulants: Secondary | ICD-10-CM | POA: Diagnosis not present

## 2020-05-07 DIAGNOSIS — E559 Vitamin D deficiency, unspecified: Secondary | ICD-10-CM | POA: Diagnosis not present

## 2020-05-07 DIAGNOSIS — I130 Hypertensive heart and chronic kidney disease with heart failure and stage 1 through stage 4 chronic kidney disease, or unspecified chronic kidney disease: Secondary | ICD-10-CM | POA: Diagnosis not present

## 2020-05-07 DIAGNOSIS — Z90710 Acquired absence of both cervix and uterus: Secondary | ICD-10-CM | POA: Diagnosis not present

## 2020-05-07 DIAGNOSIS — S12190D Other displaced fracture of second cervical vertebra, subsequent encounter for fracture with routine healing: Secondary | ICD-10-CM | POA: Diagnosis not present

## 2020-05-07 DIAGNOSIS — D631 Anemia in chronic kidney disease: Secondary | ICD-10-CM | POA: Diagnosis not present

## 2020-05-07 DIAGNOSIS — S0232XD Fracture of orbital floor, left side, subsequent encounter for fracture with routine healing: Secondary | ICD-10-CM | POA: Diagnosis not present

## 2020-05-07 DIAGNOSIS — M8580 Other specified disorders of bone density and structure, unspecified site: Secondary | ICD-10-CM | POA: Diagnosis not present

## 2020-05-07 DIAGNOSIS — S81812D Laceration without foreign body, left lower leg, subsequent encounter: Secondary | ICD-10-CM | POA: Diagnosis not present

## 2020-05-07 DIAGNOSIS — I4819 Other persistent atrial fibrillation: Secondary | ICD-10-CM | POA: Diagnosis not present

## 2020-05-07 DIAGNOSIS — I5032 Chronic diastolic (congestive) heart failure: Secondary | ICD-10-CM | POA: Diagnosis not present

## 2020-05-07 DIAGNOSIS — Z9181 History of falling: Secondary | ICD-10-CM | POA: Diagnosis not present

## 2020-05-07 DIAGNOSIS — D320 Benign neoplasm of cerebral meninges: Secondary | ICD-10-CM | POA: Diagnosis not present

## 2020-05-07 DIAGNOSIS — S022XXD Fracture of nasal bones, subsequent encounter for fracture with routine healing: Secondary | ICD-10-CM | POA: Diagnosis not present

## 2020-05-07 DIAGNOSIS — N1831 Chronic kidney disease, stage 3a: Secondary | ICD-10-CM | POA: Diagnosis not present

## 2020-05-07 DIAGNOSIS — Z8542 Personal history of malignant neoplasm of other parts of uterus: Secondary | ICD-10-CM | POA: Diagnosis not present

## 2020-05-07 DIAGNOSIS — M4854XD Collapsed vertebra, not elsewhere classified, thoracic region, subsequent encounter for fracture with routine healing: Secondary | ICD-10-CM | POA: Diagnosis not present

## 2020-05-07 DIAGNOSIS — E538 Deficiency of other specified B group vitamins: Secondary | ICD-10-CM | POA: Diagnosis not present

## 2020-05-07 DIAGNOSIS — J189 Pneumonia, unspecified organism: Secondary | ICD-10-CM | POA: Diagnosis not present

## 2020-05-07 DIAGNOSIS — Z85828 Personal history of other malignant neoplasm of skin: Secondary | ICD-10-CM | POA: Diagnosis not present

## 2020-05-07 DIAGNOSIS — E441 Mild protein-calorie malnutrition: Secondary | ICD-10-CM | POA: Diagnosis not present

## 2020-05-08 DIAGNOSIS — S0232XD Fracture of orbital floor, left side, subsequent encounter for fracture with routine healing: Secondary | ICD-10-CM | POA: Diagnosis not present

## 2020-05-08 DIAGNOSIS — D320 Benign neoplasm of cerebral meninges: Secondary | ICD-10-CM | POA: Diagnosis not present

## 2020-05-08 DIAGNOSIS — M4854XD Collapsed vertebra, not elsewhere classified, thoracic region, subsequent encounter for fracture with routine healing: Secondary | ICD-10-CM | POA: Diagnosis not present

## 2020-05-08 DIAGNOSIS — S022XXD Fracture of nasal bones, subsequent encounter for fracture with routine healing: Secondary | ICD-10-CM | POA: Diagnosis not present

## 2020-05-08 DIAGNOSIS — S81812D Laceration without foreign body, left lower leg, subsequent encounter: Secondary | ICD-10-CM | POA: Diagnosis not present

## 2020-05-08 DIAGNOSIS — S12190D Other displaced fracture of second cervical vertebra, subsequent encounter for fracture with routine healing: Secondary | ICD-10-CM | POA: Diagnosis not present

## 2020-05-12 DIAGNOSIS — S12190D Other displaced fracture of second cervical vertebra, subsequent encounter for fracture with routine healing: Secondary | ICD-10-CM | POA: Diagnosis not present

## 2020-05-12 DIAGNOSIS — D320 Benign neoplasm of cerebral meninges: Secondary | ICD-10-CM | POA: Diagnosis not present

## 2020-05-12 DIAGNOSIS — S022XXD Fracture of nasal bones, subsequent encounter for fracture with routine healing: Secondary | ICD-10-CM | POA: Diagnosis not present

## 2020-05-12 DIAGNOSIS — S81812D Laceration without foreign body, left lower leg, subsequent encounter: Secondary | ICD-10-CM | POA: Diagnosis not present

## 2020-05-12 DIAGNOSIS — S0232XD Fracture of orbital floor, left side, subsequent encounter for fracture with routine healing: Secondary | ICD-10-CM | POA: Diagnosis not present

## 2020-05-12 DIAGNOSIS — M4854XD Collapsed vertebra, not elsewhere classified, thoracic region, subsequent encounter for fracture with routine healing: Secondary | ICD-10-CM | POA: Diagnosis not present

## 2020-05-13 DIAGNOSIS — Z1159 Encounter for screening for other viral diseases: Secondary | ICD-10-CM | POA: Diagnosis not present

## 2020-05-13 DIAGNOSIS — Z20828 Contact with and (suspected) exposure to other viral communicable diseases: Secondary | ICD-10-CM | POA: Diagnosis not present

## 2020-05-15 DIAGNOSIS — M4854XD Collapsed vertebra, not elsewhere classified, thoracic region, subsequent encounter for fracture with routine healing: Secondary | ICD-10-CM | POA: Diagnosis not present

## 2020-05-15 DIAGNOSIS — S12190D Other displaced fracture of second cervical vertebra, subsequent encounter for fracture with routine healing: Secondary | ICD-10-CM | POA: Diagnosis not present

## 2020-05-15 DIAGNOSIS — D320 Benign neoplasm of cerebral meninges: Secondary | ICD-10-CM | POA: Diagnosis not present

## 2020-05-15 DIAGNOSIS — S81812D Laceration without foreign body, left lower leg, subsequent encounter: Secondary | ICD-10-CM | POA: Diagnosis not present

## 2020-05-15 DIAGNOSIS — S0232XD Fracture of orbital floor, left side, subsequent encounter for fracture with routine healing: Secondary | ICD-10-CM | POA: Diagnosis not present

## 2020-05-15 DIAGNOSIS — S022XXD Fracture of nasal bones, subsequent encounter for fracture with routine healing: Secondary | ICD-10-CM | POA: Diagnosis not present

## 2020-05-19 DIAGNOSIS — S12190D Other displaced fracture of second cervical vertebra, subsequent encounter for fracture with routine healing: Secondary | ICD-10-CM | POA: Diagnosis not present

## 2020-05-19 DIAGNOSIS — M4854XD Collapsed vertebra, not elsewhere classified, thoracic region, subsequent encounter for fracture with routine healing: Secondary | ICD-10-CM | POA: Diagnosis not present

## 2020-05-19 DIAGNOSIS — S022XXD Fracture of nasal bones, subsequent encounter for fracture with routine healing: Secondary | ICD-10-CM | POA: Diagnosis not present

## 2020-05-19 DIAGNOSIS — S0232XD Fracture of orbital floor, left side, subsequent encounter for fracture with routine healing: Secondary | ICD-10-CM | POA: Diagnosis not present

## 2020-05-19 DIAGNOSIS — D320 Benign neoplasm of cerebral meninges: Secondary | ICD-10-CM | POA: Diagnosis not present

## 2020-05-19 DIAGNOSIS — S81812D Laceration without foreign body, left lower leg, subsequent encounter: Secondary | ICD-10-CM | POA: Diagnosis not present

## 2020-05-20 DIAGNOSIS — S022XXD Fracture of nasal bones, subsequent encounter for fracture with routine healing: Secondary | ICD-10-CM | POA: Diagnosis not present

## 2020-05-20 DIAGNOSIS — I890 Lymphedema, not elsewhere classified: Secondary | ICD-10-CM | POA: Diagnosis not present

## 2020-05-20 DIAGNOSIS — S81812D Laceration without foreign body, left lower leg, subsequent encounter: Secondary | ICD-10-CM | POA: Diagnosis not present

## 2020-05-20 DIAGNOSIS — Z9989 Dependence on other enabling machines and devices: Secondary | ICD-10-CM | POA: Diagnosis not present

## 2020-05-20 DIAGNOSIS — S0232XD Fracture of orbital floor, left side, subsequent encounter for fracture with routine healing: Secondary | ICD-10-CM | POA: Diagnosis not present

## 2020-05-20 DIAGNOSIS — S12190D Other displaced fracture of second cervical vertebra, subsequent encounter for fracture with routine healing: Secondary | ICD-10-CM | POA: Diagnosis not present

## 2020-05-20 DIAGNOSIS — Z20828 Contact with and (suspected) exposure to other viral communicable diseases: Secondary | ICD-10-CM | POA: Diagnosis not present

## 2020-05-20 DIAGNOSIS — M4854XD Collapsed vertebra, not elsewhere classified, thoracic region, subsequent encounter for fracture with routine healing: Secondary | ICD-10-CM | POA: Diagnosis not present

## 2020-05-20 DIAGNOSIS — D320 Benign neoplasm of cerebral meninges: Secondary | ICD-10-CM | POA: Diagnosis not present

## 2020-05-20 DIAGNOSIS — Z1159 Encounter for screening for other viral diseases: Secondary | ICD-10-CM | POA: Diagnosis not present

## 2020-05-22 DIAGNOSIS — S12190D Other displaced fracture of second cervical vertebra, subsequent encounter for fracture with routine healing: Secondary | ICD-10-CM | POA: Diagnosis not present

## 2020-05-22 DIAGNOSIS — D320 Benign neoplasm of cerebral meninges: Secondary | ICD-10-CM | POA: Diagnosis not present

## 2020-05-22 DIAGNOSIS — S81812D Laceration without foreign body, left lower leg, subsequent encounter: Secondary | ICD-10-CM | POA: Diagnosis not present

## 2020-05-22 DIAGNOSIS — S0232XD Fracture of orbital floor, left side, subsequent encounter for fracture with routine healing: Secondary | ICD-10-CM | POA: Diagnosis not present

## 2020-05-22 DIAGNOSIS — M4854XD Collapsed vertebra, not elsewhere classified, thoracic region, subsequent encounter for fracture with routine healing: Secondary | ICD-10-CM | POA: Diagnosis not present

## 2020-05-22 DIAGNOSIS — S022XXD Fracture of nasal bones, subsequent encounter for fracture with routine healing: Secondary | ICD-10-CM | POA: Diagnosis not present

## 2020-05-26 DIAGNOSIS — D320 Benign neoplasm of cerebral meninges: Secondary | ICD-10-CM | POA: Diagnosis not present

## 2020-05-26 DIAGNOSIS — S022XXD Fracture of nasal bones, subsequent encounter for fracture with routine healing: Secondary | ICD-10-CM | POA: Diagnosis not present

## 2020-05-26 DIAGNOSIS — S81812D Laceration without foreign body, left lower leg, subsequent encounter: Secondary | ICD-10-CM | POA: Diagnosis not present

## 2020-05-26 DIAGNOSIS — S12190D Other displaced fracture of second cervical vertebra, subsequent encounter for fracture with routine healing: Secondary | ICD-10-CM | POA: Diagnosis not present

## 2020-05-26 DIAGNOSIS — M4854XD Collapsed vertebra, not elsewhere classified, thoracic region, subsequent encounter for fracture with routine healing: Secondary | ICD-10-CM | POA: Diagnosis not present

## 2020-05-26 DIAGNOSIS — S0232XD Fracture of orbital floor, left side, subsequent encounter for fracture with routine healing: Secondary | ICD-10-CM | POA: Diagnosis not present

## 2020-06-04 DIAGNOSIS — D320 Benign neoplasm of cerebral meninges: Secondary | ICD-10-CM | POA: Diagnosis not present

## 2020-06-04 DIAGNOSIS — M4854XD Collapsed vertebra, not elsewhere classified, thoracic region, subsequent encounter for fracture with routine healing: Secondary | ICD-10-CM | POA: Diagnosis not present

## 2020-06-04 DIAGNOSIS — S0232XD Fracture of orbital floor, left side, subsequent encounter for fracture with routine healing: Secondary | ICD-10-CM | POA: Diagnosis not present

## 2020-06-04 DIAGNOSIS — S022XXD Fracture of nasal bones, subsequent encounter for fracture with routine healing: Secondary | ICD-10-CM | POA: Diagnosis not present

## 2020-06-04 DIAGNOSIS — S12190D Other displaced fracture of second cervical vertebra, subsequent encounter for fracture with routine healing: Secondary | ICD-10-CM | POA: Diagnosis not present

## 2020-06-04 DIAGNOSIS — S81812D Laceration without foreign body, left lower leg, subsequent encounter: Secondary | ICD-10-CM | POA: Diagnosis not present

## 2020-06-06 DIAGNOSIS — S0232XD Fracture of orbital floor, left side, subsequent encounter for fracture with routine healing: Secondary | ICD-10-CM | POA: Diagnosis not present

## 2020-06-17 DIAGNOSIS — Z1159 Encounter for screening for other viral diseases: Secondary | ICD-10-CM | POA: Diagnosis not present

## 2020-06-17 DIAGNOSIS — Z20828 Contact with and (suspected) exposure to other viral communicable diseases: Secondary | ICD-10-CM | POA: Diagnosis not present

## 2020-06-24 DIAGNOSIS — R635 Abnormal weight gain: Secondary | ICD-10-CM | POA: Diagnosis not present

## 2020-06-24 DIAGNOSIS — E538 Deficiency of other specified B group vitamins: Secondary | ICD-10-CM | POA: Diagnosis not present

## 2020-06-24 DIAGNOSIS — D539 Nutritional anemia, unspecified: Secondary | ICD-10-CM | POA: Diagnosis not present

## 2020-06-24 DIAGNOSIS — E559 Vitamin D deficiency, unspecified: Secondary | ICD-10-CM | POA: Diagnosis not present

## 2020-06-24 DIAGNOSIS — M6281 Muscle weakness (generalized): Secondary | ICD-10-CM | POA: Diagnosis not present

## 2020-06-24 DIAGNOSIS — Z20828 Contact with and (suspected) exposure to other viral communicable diseases: Secondary | ICD-10-CM | POA: Diagnosis not present

## 2020-06-24 DIAGNOSIS — K5909 Other constipation: Secondary | ICD-10-CM | POA: Diagnosis not present

## 2020-06-24 DIAGNOSIS — Z1159 Encounter for screening for other viral diseases: Secondary | ICD-10-CM | POA: Diagnosis not present

## 2020-06-26 DIAGNOSIS — R278 Other lack of coordination: Secondary | ICD-10-CM | POA: Diagnosis not present

## 2020-06-26 DIAGNOSIS — M6281 Muscle weakness (generalized): Secondary | ICD-10-CM | POA: Diagnosis not present

## 2020-06-26 DIAGNOSIS — R2681 Unsteadiness on feet: Secondary | ICD-10-CM | POA: Diagnosis not present

## 2020-06-27 DIAGNOSIS — R278 Other lack of coordination: Secondary | ICD-10-CM | POA: Diagnosis not present

## 2020-06-27 DIAGNOSIS — M6281 Muscle weakness (generalized): Secondary | ICD-10-CM | POA: Diagnosis not present

## 2020-06-27 DIAGNOSIS — R2681 Unsteadiness on feet: Secondary | ICD-10-CM | POA: Diagnosis not present

## 2020-06-29 DIAGNOSIS — M6281 Muscle weakness (generalized): Secondary | ICD-10-CM | POA: Diagnosis not present

## 2020-06-29 DIAGNOSIS — R2681 Unsteadiness on feet: Secondary | ICD-10-CM | POA: Diagnosis not present

## 2020-06-29 DIAGNOSIS — R278 Other lack of coordination: Secondary | ICD-10-CM | POA: Diagnosis not present

## 2020-07-01 DIAGNOSIS — Z20828 Contact with and (suspected) exposure to other viral communicable diseases: Secondary | ICD-10-CM | POA: Diagnosis not present

## 2020-07-01 DIAGNOSIS — R278 Other lack of coordination: Secondary | ICD-10-CM | POA: Diagnosis not present

## 2020-07-01 DIAGNOSIS — M6281 Muscle weakness (generalized): Secondary | ICD-10-CM | POA: Diagnosis not present

## 2020-07-01 DIAGNOSIS — R2681 Unsteadiness on feet: Secondary | ICD-10-CM | POA: Diagnosis not present

## 2020-07-01 DIAGNOSIS — Z1159 Encounter for screening for other viral diseases: Secondary | ICD-10-CM | POA: Diagnosis not present

## 2020-07-02 DIAGNOSIS — R2681 Unsteadiness on feet: Secondary | ICD-10-CM | POA: Diagnosis not present

## 2020-07-02 DIAGNOSIS — R278 Other lack of coordination: Secondary | ICD-10-CM | POA: Diagnosis not present

## 2020-07-02 DIAGNOSIS — M6281 Muscle weakness (generalized): Secondary | ICD-10-CM | POA: Diagnosis not present

## 2020-07-03 DIAGNOSIS — R2681 Unsteadiness on feet: Secondary | ICD-10-CM | POA: Diagnosis not present

## 2020-07-03 DIAGNOSIS — R278 Other lack of coordination: Secondary | ICD-10-CM | POA: Diagnosis not present

## 2020-07-03 DIAGNOSIS — M6281 Muscle weakness (generalized): Secondary | ICD-10-CM | POA: Diagnosis not present

## 2020-07-06 DIAGNOSIS — R2681 Unsteadiness on feet: Secondary | ICD-10-CM | POA: Diagnosis not present

## 2020-07-06 DIAGNOSIS — M6281 Muscle weakness (generalized): Secondary | ICD-10-CM | POA: Diagnosis not present

## 2020-07-06 DIAGNOSIS — R278 Other lack of coordination: Secondary | ICD-10-CM | POA: Diagnosis not present

## 2020-07-07 DIAGNOSIS — H524 Presbyopia: Secondary | ICD-10-CM | POA: Diagnosis not present

## 2020-07-07 DIAGNOSIS — H2513 Age-related nuclear cataract, bilateral: Secondary | ICD-10-CM | POA: Diagnosis not present

## 2020-07-07 DIAGNOSIS — R2681 Unsteadiness on feet: Secondary | ICD-10-CM | POA: Diagnosis not present

## 2020-07-07 DIAGNOSIS — M6281 Muscle weakness (generalized): Secondary | ICD-10-CM | POA: Diagnosis not present

## 2020-07-07 DIAGNOSIS — R278 Other lack of coordination: Secondary | ICD-10-CM | POA: Diagnosis not present

## 2020-07-07 DIAGNOSIS — H52203 Unspecified astigmatism, bilateral: Secondary | ICD-10-CM | POA: Diagnosis not present

## 2020-07-07 DIAGNOSIS — H5203 Hypermetropia, bilateral: Secondary | ICD-10-CM | POA: Diagnosis not present

## 2020-07-08 DIAGNOSIS — Z20828 Contact with and (suspected) exposure to other viral communicable diseases: Secondary | ICD-10-CM | POA: Diagnosis not present

## 2020-07-08 DIAGNOSIS — R2681 Unsteadiness on feet: Secondary | ICD-10-CM | POA: Diagnosis not present

## 2020-07-08 DIAGNOSIS — M6281 Muscle weakness (generalized): Secondary | ICD-10-CM | POA: Diagnosis not present

## 2020-07-08 DIAGNOSIS — R278 Other lack of coordination: Secondary | ICD-10-CM | POA: Diagnosis not present

## 2020-07-08 DIAGNOSIS — Z1159 Encounter for screening for other viral diseases: Secondary | ICD-10-CM | POA: Diagnosis not present

## 2020-07-09 DIAGNOSIS — R278 Other lack of coordination: Secondary | ICD-10-CM | POA: Diagnosis not present

## 2020-07-09 DIAGNOSIS — R2681 Unsteadiness on feet: Secondary | ICD-10-CM | POA: Diagnosis not present

## 2020-07-09 DIAGNOSIS — M6281 Muscle weakness (generalized): Secondary | ICD-10-CM | POA: Diagnosis not present

## 2020-07-10 DIAGNOSIS — E559 Vitamin D deficiency, unspecified: Secondary | ICD-10-CM | POA: Diagnosis not present

## 2020-07-11 DIAGNOSIS — E559 Vitamin D deficiency, unspecified: Secondary | ICD-10-CM | POA: Diagnosis not present

## 2020-07-13 DIAGNOSIS — R278 Other lack of coordination: Secondary | ICD-10-CM | POA: Diagnosis not present

## 2020-07-13 DIAGNOSIS — M6281 Muscle weakness (generalized): Secondary | ICD-10-CM | POA: Diagnosis not present

## 2020-07-13 DIAGNOSIS — R2681 Unsteadiness on feet: Secondary | ICD-10-CM | POA: Diagnosis not present

## 2020-07-15 DIAGNOSIS — I509 Heart failure, unspecified: Secondary | ICD-10-CM | POA: Diagnosis not present

## 2020-07-15 DIAGNOSIS — R195 Other fecal abnormalities: Secondary | ICD-10-CM | POA: Diagnosis not present

## 2020-07-15 DIAGNOSIS — Z20828 Contact with and (suspected) exposure to other viral communicable diseases: Secondary | ICD-10-CM | POA: Diagnosis not present

## 2020-07-15 DIAGNOSIS — M6281 Muscle weakness (generalized): Secondary | ICD-10-CM | POA: Diagnosis not present

## 2020-07-15 DIAGNOSIS — Z1159 Encounter for screening for other viral diseases: Secondary | ICD-10-CM | POA: Diagnosis not present

## 2020-07-15 DIAGNOSIS — R600 Localized edema: Secondary | ICD-10-CM | POA: Diagnosis not present

## 2020-07-15 DIAGNOSIS — R635 Abnormal weight gain: Secondary | ICD-10-CM | POA: Diagnosis not present

## 2020-07-15 DIAGNOSIS — R278 Other lack of coordination: Secondary | ICD-10-CM | POA: Diagnosis not present

## 2020-07-15 DIAGNOSIS — Z79899 Other long term (current) drug therapy: Secondary | ICD-10-CM | POA: Diagnosis not present

## 2020-07-15 DIAGNOSIS — R2681 Unsteadiness on feet: Secondary | ICD-10-CM | POA: Diagnosis not present

## 2020-07-16 DIAGNOSIS — I82491 Acute embolism and thrombosis of other specified deep vein of right lower extremity: Secondary | ICD-10-CM | POA: Diagnosis not present

## 2020-07-17 DIAGNOSIS — R278 Other lack of coordination: Secondary | ICD-10-CM | POA: Diagnosis not present

## 2020-07-17 DIAGNOSIS — R2681 Unsteadiness on feet: Secondary | ICD-10-CM | POA: Diagnosis not present

## 2020-07-17 DIAGNOSIS — M6281 Muscle weakness (generalized): Secondary | ICD-10-CM | POA: Diagnosis not present

## 2020-07-20 DIAGNOSIS — R2681 Unsteadiness on feet: Secondary | ICD-10-CM | POA: Diagnosis not present

## 2020-07-20 DIAGNOSIS — M6281 Muscle weakness (generalized): Secondary | ICD-10-CM | POA: Diagnosis not present

## 2020-07-20 DIAGNOSIS — R278 Other lack of coordination: Secondary | ICD-10-CM | POA: Diagnosis not present

## 2020-07-21 DIAGNOSIS — I130 Hypertensive heart and chronic kidney disease with heart failure and stage 1 through stage 4 chronic kidney disease, or unspecified chronic kidney disease: Secondary | ICD-10-CM | POA: Diagnosis not present

## 2020-07-21 DIAGNOSIS — D539 Nutritional anemia, unspecified: Secondary | ICD-10-CM | POA: Diagnosis not present

## 2020-07-21 DIAGNOSIS — N1831 Chronic kidney disease, stage 3a: Secondary | ICD-10-CM | POA: Diagnosis not present

## 2020-07-21 DIAGNOSIS — I890 Lymphedema, not elsewhere classified: Secondary | ICD-10-CM | POA: Diagnosis not present

## 2020-07-21 DIAGNOSIS — S81811D Laceration without foreign body, right lower leg, subsequent encounter: Secondary | ICD-10-CM | POA: Diagnosis not present

## 2020-07-21 DIAGNOSIS — I10 Essential (primary) hypertension: Secondary | ICD-10-CM | POA: Diagnosis not present

## 2020-07-21 DIAGNOSIS — Z8542 Personal history of malignant neoplasm of other parts of uterus: Secondary | ICD-10-CM | POA: Diagnosis not present

## 2020-07-21 DIAGNOSIS — I480 Paroxysmal atrial fibrillation: Secondary | ICD-10-CM | POA: Diagnosis not present

## 2020-07-21 DIAGNOSIS — J984 Other disorders of lung: Secondary | ICD-10-CM | POA: Diagnosis not present

## 2020-07-21 DIAGNOSIS — Z8701 Personal history of pneumonia (recurrent): Secondary | ICD-10-CM | POA: Diagnosis not present

## 2020-07-21 DIAGNOSIS — I5032 Chronic diastolic (congestive) heart failure: Secondary | ICD-10-CM | POA: Diagnosis not present

## 2020-07-21 DIAGNOSIS — E782 Mixed hyperlipidemia: Secondary | ICD-10-CM | POA: Diagnosis not present

## 2020-07-21 DIAGNOSIS — E538 Deficiency of other specified B group vitamins: Secondary | ICD-10-CM | POA: Diagnosis not present

## 2020-07-21 DIAGNOSIS — E441 Mild protein-calorie malnutrition: Secondary | ICD-10-CM | POA: Diagnosis not present

## 2020-07-21 DIAGNOSIS — D631 Anemia in chronic kidney disease: Secondary | ICD-10-CM | POA: Diagnosis not present

## 2020-07-21 DIAGNOSIS — Z7901 Long term (current) use of anticoagulants: Secondary | ICD-10-CM | POA: Diagnosis not present

## 2020-07-22 DIAGNOSIS — Z20828 Contact with and (suspected) exposure to other viral communicable diseases: Secondary | ICD-10-CM | POA: Diagnosis not present

## 2020-07-22 DIAGNOSIS — Z1159 Encounter for screening for other viral diseases: Secondary | ICD-10-CM | POA: Diagnosis not present

## 2020-07-23 DIAGNOSIS — S81811D Laceration without foreign body, right lower leg, subsequent encounter: Secondary | ICD-10-CM | POA: Diagnosis not present

## 2020-07-23 DIAGNOSIS — I890 Lymphedema, not elsewhere classified: Secondary | ICD-10-CM | POA: Diagnosis not present

## 2020-07-23 DIAGNOSIS — N1831 Chronic kidney disease, stage 3a: Secondary | ICD-10-CM | POA: Diagnosis not present

## 2020-07-23 DIAGNOSIS — D631 Anemia in chronic kidney disease: Secondary | ICD-10-CM | POA: Diagnosis not present

## 2020-07-23 DIAGNOSIS — I130 Hypertensive heart and chronic kidney disease with heart failure and stage 1 through stage 4 chronic kidney disease, or unspecified chronic kidney disease: Secondary | ICD-10-CM | POA: Diagnosis not present

## 2020-07-23 DIAGNOSIS — I5032 Chronic diastolic (congestive) heart failure: Secondary | ICD-10-CM | POA: Diagnosis not present

## 2020-07-23 DIAGNOSIS — Z79899 Other long term (current) drug therapy: Secondary | ICD-10-CM | POA: Diagnosis not present

## 2020-07-24 DIAGNOSIS — S81811D Laceration without foreign body, right lower leg, subsequent encounter: Secondary | ICD-10-CM | POA: Diagnosis not present

## 2020-07-24 DIAGNOSIS — N1831 Chronic kidney disease, stage 3a: Secondary | ICD-10-CM | POA: Diagnosis not present

## 2020-07-24 DIAGNOSIS — I890 Lymphedema, not elsewhere classified: Secondary | ICD-10-CM | POA: Diagnosis not present

## 2020-07-24 DIAGNOSIS — D631 Anemia in chronic kidney disease: Secondary | ICD-10-CM | POA: Diagnosis not present

## 2020-07-24 DIAGNOSIS — I5032 Chronic diastolic (congestive) heart failure: Secondary | ICD-10-CM | POA: Diagnosis not present

## 2020-07-24 DIAGNOSIS — I130 Hypertensive heart and chronic kidney disease with heart failure and stage 1 through stage 4 chronic kidney disease, or unspecified chronic kidney disease: Secondary | ICD-10-CM | POA: Diagnosis not present

## 2020-07-27 DIAGNOSIS — I890 Lymphedema, not elsewhere classified: Secondary | ICD-10-CM | POA: Diagnosis not present

## 2020-07-27 DIAGNOSIS — I5032 Chronic diastolic (congestive) heart failure: Secondary | ICD-10-CM | POA: Diagnosis not present

## 2020-07-27 DIAGNOSIS — N1831 Chronic kidney disease, stage 3a: Secondary | ICD-10-CM | POA: Diagnosis not present

## 2020-07-27 DIAGNOSIS — S81811D Laceration without foreign body, right lower leg, subsequent encounter: Secondary | ICD-10-CM | POA: Diagnosis not present

## 2020-07-27 DIAGNOSIS — I130 Hypertensive heart and chronic kidney disease with heart failure and stage 1 through stage 4 chronic kidney disease, or unspecified chronic kidney disease: Secondary | ICD-10-CM | POA: Diagnosis not present

## 2020-07-27 DIAGNOSIS — D631 Anemia in chronic kidney disease: Secondary | ICD-10-CM | POA: Diagnosis not present

## 2020-07-28 DIAGNOSIS — I890 Lymphedema, not elsewhere classified: Secondary | ICD-10-CM | POA: Diagnosis not present

## 2020-07-28 DIAGNOSIS — I130 Hypertensive heart and chronic kidney disease with heart failure and stage 1 through stage 4 chronic kidney disease, or unspecified chronic kidney disease: Secondary | ICD-10-CM | POA: Diagnosis not present

## 2020-07-28 DIAGNOSIS — N1831 Chronic kidney disease, stage 3a: Secondary | ICD-10-CM | POA: Diagnosis not present

## 2020-07-28 DIAGNOSIS — I5032 Chronic diastolic (congestive) heart failure: Secondary | ICD-10-CM | POA: Diagnosis not present

## 2020-07-28 DIAGNOSIS — S81811D Laceration without foreign body, right lower leg, subsequent encounter: Secondary | ICD-10-CM | POA: Diagnosis not present

## 2020-07-28 DIAGNOSIS — D631 Anemia in chronic kidney disease: Secondary | ICD-10-CM | POA: Diagnosis not present

## 2020-07-29 DIAGNOSIS — I4891 Unspecified atrial fibrillation: Secondary | ICD-10-CM | POA: Diagnosis not present

## 2020-07-29 DIAGNOSIS — N179 Acute kidney failure, unspecified: Secondary | ICD-10-CM | POA: Diagnosis not present

## 2020-07-29 DIAGNOSIS — Z1159 Encounter for screening for other viral diseases: Secondary | ICD-10-CM | POA: Diagnosis not present

## 2020-07-29 DIAGNOSIS — R634 Abnormal weight loss: Secondary | ICD-10-CM | POA: Diagnosis not present

## 2020-07-29 DIAGNOSIS — E873 Alkalosis: Secondary | ICD-10-CM | POA: Diagnosis not present

## 2020-07-29 DIAGNOSIS — Z20828 Contact with and (suspected) exposure to other viral communicable diseases: Secondary | ICD-10-CM | POA: Diagnosis not present

## 2020-07-29 DIAGNOSIS — N1832 Chronic kidney disease, stage 3b: Secondary | ICD-10-CM | POA: Diagnosis not present

## 2020-07-29 DIAGNOSIS — R600 Localized edema: Secondary | ICD-10-CM | POA: Diagnosis not present

## 2020-07-31 DIAGNOSIS — I130 Hypertensive heart and chronic kidney disease with heart failure and stage 1 through stage 4 chronic kidney disease, or unspecified chronic kidney disease: Secondary | ICD-10-CM | POA: Diagnosis not present

## 2020-07-31 DIAGNOSIS — I890 Lymphedema, not elsewhere classified: Secondary | ICD-10-CM | POA: Diagnosis not present

## 2020-07-31 DIAGNOSIS — I5032 Chronic diastolic (congestive) heart failure: Secondary | ICD-10-CM | POA: Diagnosis not present

## 2020-07-31 DIAGNOSIS — N1831 Chronic kidney disease, stage 3a: Secondary | ICD-10-CM | POA: Diagnosis not present

## 2020-07-31 DIAGNOSIS — D631 Anemia in chronic kidney disease: Secondary | ICD-10-CM | POA: Diagnosis not present

## 2020-07-31 DIAGNOSIS — S81811D Laceration without foreign body, right lower leg, subsequent encounter: Secondary | ICD-10-CM | POA: Diagnosis not present

## 2020-08-04 DIAGNOSIS — I5032 Chronic diastolic (congestive) heart failure: Secondary | ICD-10-CM | POA: Diagnosis not present

## 2020-08-04 DIAGNOSIS — N1831 Chronic kidney disease, stage 3a: Secondary | ICD-10-CM | POA: Diagnosis not present

## 2020-08-04 DIAGNOSIS — I130 Hypertensive heart and chronic kidney disease with heart failure and stage 1 through stage 4 chronic kidney disease, or unspecified chronic kidney disease: Secondary | ICD-10-CM | POA: Diagnosis not present

## 2020-08-04 DIAGNOSIS — I890 Lymphedema, not elsewhere classified: Secondary | ICD-10-CM | POA: Diagnosis not present

## 2020-08-04 DIAGNOSIS — D631 Anemia in chronic kidney disease: Secondary | ICD-10-CM | POA: Diagnosis not present

## 2020-08-04 DIAGNOSIS — S81811D Laceration without foreign body, right lower leg, subsequent encounter: Secondary | ICD-10-CM | POA: Diagnosis not present

## 2020-08-05 DIAGNOSIS — Z1159 Encounter for screening for other viral diseases: Secondary | ICD-10-CM | POA: Diagnosis not present

## 2020-08-05 DIAGNOSIS — Z20828 Contact with and (suspected) exposure to other viral communicable diseases: Secondary | ICD-10-CM | POA: Diagnosis not present

## 2020-08-07 DIAGNOSIS — D631 Anemia in chronic kidney disease: Secondary | ICD-10-CM | POA: Diagnosis not present

## 2020-08-07 DIAGNOSIS — I130 Hypertensive heart and chronic kidney disease with heart failure and stage 1 through stage 4 chronic kidney disease, or unspecified chronic kidney disease: Secondary | ICD-10-CM | POA: Diagnosis not present

## 2020-08-07 DIAGNOSIS — I5032 Chronic diastolic (congestive) heart failure: Secondary | ICD-10-CM | POA: Diagnosis not present

## 2020-08-07 DIAGNOSIS — S81811D Laceration without foreign body, right lower leg, subsequent encounter: Secondary | ICD-10-CM | POA: Diagnosis not present

## 2020-08-07 DIAGNOSIS — I890 Lymphedema, not elsewhere classified: Secondary | ICD-10-CM | POA: Diagnosis not present

## 2020-08-07 DIAGNOSIS — N1831 Chronic kidney disease, stage 3a: Secondary | ICD-10-CM | POA: Diagnosis not present

## 2020-08-11 DIAGNOSIS — I890 Lymphedema, not elsewhere classified: Secondary | ICD-10-CM | POA: Diagnosis not present

## 2020-08-11 DIAGNOSIS — I5032 Chronic diastolic (congestive) heart failure: Secondary | ICD-10-CM | POA: Diagnosis not present

## 2020-08-11 DIAGNOSIS — N1831 Chronic kidney disease, stage 3a: Secondary | ICD-10-CM | POA: Diagnosis not present

## 2020-08-11 DIAGNOSIS — S81811D Laceration without foreign body, right lower leg, subsequent encounter: Secondary | ICD-10-CM | POA: Diagnosis not present

## 2020-08-11 DIAGNOSIS — D631 Anemia in chronic kidney disease: Secondary | ICD-10-CM | POA: Diagnosis not present

## 2020-08-11 DIAGNOSIS — I130 Hypertensive heart and chronic kidney disease with heart failure and stage 1 through stage 4 chronic kidney disease, or unspecified chronic kidney disease: Secondary | ICD-10-CM | POA: Diagnosis not present

## 2020-08-12 DIAGNOSIS — Z1159 Encounter for screening for other viral diseases: Secondary | ICD-10-CM | POA: Diagnosis not present

## 2020-08-12 DIAGNOSIS — Z20828 Contact with and (suspected) exposure to other viral communicable diseases: Secondary | ICD-10-CM | POA: Diagnosis not present

## 2020-08-19 DIAGNOSIS — D631 Anemia in chronic kidney disease: Secondary | ICD-10-CM | POA: Diagnosis not present

## 2020-08-19 DIAGNOSIS — S81811D Laceration without foreign body, right lower leg, subsequent encounter: Secondary | ICD-10-CM | POA: Diagnosis not present

## 2020-08-19 DIAGNOSIS — I10 Essential (primary) hypertension: Secondary | ICD-10-CM | POA: Diagnosis not present

## 2020-08-19 DIAGNOSIS — N179 Acute kidney failure, unspecified: Secondary | ICD-10-CM | POA: Diagnosis not present

## 2020-08-19 DIAGNOSIS — N1831 Chronic kidney disease, stage 3a: Secondary | ICD-10-CM | POA: Diagnosis not present

## 2020-08-19 DIAGNOSIS — I5032 Chronic diastolic (congestive) heart failure: Secondary | ICD-10-CM | POA: Diagnosis not present

## 2020-08-19 DIAGNOSIS — Z20828 Contact with and (suspected) exposure to other viral communicable diseases: Secondary | ICD-10-CM | POA: Diagnosis not present

## 2020-08-19 DIAGNOSIS — I890 Lymphedema, not elsewhere classified: Secondary | ICD-10-CM | POA: Diagnosis not present

## 2020-08-19 DIAGNOSIS — Z1159 Encounter for screening for other viral diseases: Secondary | ICD-10-CM | POA: Diagnosis not present

## 2020-08-19 DIAGNOSIS — I130 Hypertensive heart and chronic kidney disease with heart failure and stage 1 through stage 4 chronic kidney disease, or unspecified chronic kidney disease: Secondary | ICD-10-CM | POA: Diagnosis not present

## 2020-08-20 DIAGNOSIS — I130 Hypertensive heart and chronic kidney disease with heart failure and stage 1 through stage 4 chronic kidney disease, or unspecified chronic kidney disease: Secondary | ICD-10-CM | POA: Diagnosis not present

## 2020-08-20 DIAGNOSIS — Z8701 Personal history of pneumonia (recurrent): Secondary | ICD-10-CM | POA: Diagnosis not present

## 2020-08-20 DIAGNOSIS — Z8542 Personal history of malignant neoplasm of other parts of uterus: Secondary | ICD-10-CM | POA: Diagnosis not present

## 2020-08-20 DIAGNOSIS — I5032 Chronic diastolic (congestive) heart failure: Secondary | ICD-10-CM | POA: Diagnosis not present

## 2020-08-20 DIAGNOSIS — Z7901 Long term (current) use of anticoagulants: Secondary | ICD-10-CM | POA: Diagnosis not present

## 2020-08-20 DIAGNOSIS — J984 Other disorders of lung: Secondary | ICD-10-CM | POA: Diagnosis not present

## 2020-08-20 DIAGNOSIS — I10 Essential (primary) hypertension: Secondary | ICD-10-CM | POA: Diagnosis not present

## 2020-08-20 DIAGNOSIS — N1831 Chronic kidney disease, stage 3a: Secondary | ICD-10-CM | POA: Diagnosis not present

## 2020-08-20 DIAGNOSIS — I480 Paroxysmal atrial fibrillation: Secondary | ICD-10-CM | POA: Diagnosis not present

## 2020-08-20 DIAGNOSIS — E441 Mild protein-calorie malnutrition: Secondary | ICD-10-CM | POA: Diagnosis not present

## 2020-08-20 DIAGNOSIS — E782 Mixed hyperlipidemia: Secondary | ICD-10-CM | POA: Diagnosis not present

## 2020-08-20 DIAGNOSIS — E538 Deficiency of other specified B group vitamins: Secondary | ICD-10-CM | POA: Diagnosis not present

## 2020-08-20 DIAGNOSIS — S81811D Laceration without foreign body, right lower leg, subsequent encounter: Secondary | ICD-10-CM | POA: Diagnosis not present

## 2020-08-20 DIAGNOSIS — I890 Lymphedema, not elsewhere classified: Secondary | ICD-10-CM | POA: Diagnosis not present

## 2020-08-20 DIAGNOSIS — D539 Nutritional anemia, unspecified: Secondary | ICD-10-CM | POA: Diagnosis not present

## 2020-08-20 DIAGNOSIS — S12112A Nondisplaced Type II dens fracture, initial encounter for closed fracture: Secondary | ICD-10-CM | POA: Diagnosis not present

## 2020-08-20 DIAGNOSIS — D631 Anemia in chronic kidney disease: Secondary | ICD-10-CM | POA: Diagnosis not present

## 2020-08-26 DIAGNOSIS — M6281 Muscle weakness (generalized): Secondary | ICD-10-CM | POA: Diagnosis not present

## 2020-08-26 DIAGNOSIS — I130 Hypertensive heart and chronic kidney disease with heart failure and stage 1 through stage 4 chronic kidney disease, or unspecified chronic kidney disease: Secondary | ICD-10-CM | POA: Diagnosis not present

## 2020-08-26 DIAGNOSIS — Z23 Encounter for immunization: Secondary | ICD-10-CM | POA: Diagnosis not present

## 2020-08-26 DIAGNOSIS — I129 Hypertensive chronic kidney disease with stage 1 through stage 4 chronic kidney disease, or unspecified chronic kidney disease: Secondary | ICD-10-CM | POA: Diagnosis not present

## 2020-08-26 DIAGNOSIS — N184 Chronic kidney disease, stage 4 (severe): Secondary | ICD-10-CM | POA: Diagnosis not present

## 2020-08-26 DIAGNOSIS — I4891 Unspecified atrial fibrillation: Secondary | ICD-10-CM | POA: Diagnosis not present

## 2020-08-26 DIAGNOSIS — Z20828 Contact with and (suspected) exposure to other viral communicable diseases: Secondary | ICD-10-CM | POA: Diagnosis not present

## 2020-08-26 DIAGNOSIS — I5032 Chronic diastolic (congestive) heart failure: Secondary | ICD-10-CM | POA: Diagnosis not present

## 2020-08-26 DIAGNOSIS — N1831 Chronic kidney disease, stage 3a: Secondary | ICD-10-CM | POA: Diagnosis not present

## 2020-08-26 DIAGNOSIS — I890 Lymphedema, not elsewhere classified: Secondary | ICD-10-CM | POA: Diagnosis not present

## 2020-08-26 DIAGNOSIS — D631 Anemia in chronic kidney disease: Secondary | ICD-10-CM | POA: Diagnosis not present

## 2020-08-26 DIAGNOSIS — S81811D Laceration without foreign body, right lower leg, subsequent encounter: Secondary | ICD-10-CM | POA: Diagnosis not present

## 2020-08-26 DIAGNOSIS — Z1159 Encounter for screening for other viral diseases: Secondary | ICD-10-CM | POA: Diagnosis not present

## 2020-09-01 DIAGNOSIS — Z20828 Contact with and (suspected) exposure to other viral communicable diseases: Secondary | ICD-10-CM | POA: Diagnosis not present

## 2020-09-01 DIAGNOSIS — Z1159 Encounter for screening for other viral diseases: Secondary | ICD-10-CM | POA: Diagnosis not present

## 2020-09-02 DIAGNOSIS — Z9181 History of falling: Secondary | ICD-10-CM | POA: Diagnosis not present

## 2020-09-02 DIAGNOSIS — R2689 Other abnormalities of gait and mobility: Secondary | ICD-10-CM | POA: Diagnosis not present

## 2020-09-02 DIAGNOSIS — R278 Other lack of coordination: Secondary | ICD-10-CM | POA: Diagnosis not present

## 2020-09-02 DIAGNOSIS — R2681 Unsteadiness on feet: Secondary | ICD-10-CM | POA: Diagnosis not present

## 2020-09-02 DIAGNOSIS — M6281 Muscle weakness (generalized): Secondary | ICD-10-CM | POA: Diagnosis not present

## 2020-09-03 DIAGNOSIS — R2689 Other abnormalities of gait and mobility: Secondary | ICD-10-CM | POA: Diagnosis not present

## 2020-09-03 DIAGNOSIS — R278 Other lack of coordination: Secondary | ICD-10-CM | POA: Diagnosis not present

## 2020-09-03 DIAGNOSIS — M6281 Muscle weakness (generalized): Secondary | ICD-10-CM | POA: Diagnosis not present

## 2020-09-03 DIAGNOSIS — Z9181 History of falling: Secondary | ICD-10-CM | POA: Diagnosis not present

## 2020-09-03 DIAGNOSIS — R2681 Unsteadiness on feet: Secondary | ICD-10-CM | POA: Diagnosis not present

## 2020-09-04 DIAGNOSIS — R2689 Other abnormalities of gait and mobility: Secondary | ICD-10-CM | POA: Diagnosis not present

## 2020-09-04 DIAGNOSIS — Z1159 Encounter for screening for other viral diseases: Secondary | ICD-10-CM | POA: Diagnosis not present

## 2020-09-04 DIAGNOSIS — Z20828 Contact with and (suspected) exposure to other viral communicable diseases: Secondary | ICD-10-CM | POA: Diagnosis not present

## 2020-09-04 DIAGNOSIS — R2681 Unsteadiness on feet: Secondary | ICD-10-CM | POA: Diagnosis not present

## 2020-09-04 DIAGNOSIS — R278 Other lack of coordination: Secondary | ICD-10-CM | POA: Diagnosis not present

## 2020-09-04 DIAGNOSIS — M6281 Muscle weakness (generalized): Secondary | ICD-10-CM | POA: Diagnosis not present

## 2020-09-04 DIAGNOSIS — Z9181 History of falling: Secondary | ICD-10-CM | POA: Diagnosis not present

## 2020-09-07 DIAGNOSIS — R2681 Unsteadiness on feet: Secondary | ICD-10-CM | POA: Diagnosis not present

## 2020-09-07 DIAGNOSIS — M6281 Muscle weakness (generalized): Secondary | ICD-10-CM | POA: Diagnosis not present

## 2020-09-07 DIAGNOSIS — Z9181 History of falling: Secondary | ICD-10-CM | POA: Diagnosis not present

## 2020-09-07 DIAGNOSIS — R2689 Other abnormalities of gait and mobility: Secondary | ICD-10-CM | POA: Diagnosis not present

## 2020-09-07 DIAGNOSIS — R278 Other lack of coordination: Secondary | ICD-10-CM | POA: Diagnosis not present

## 2020-09-08 DIAGNOSIS — Z9181 History of falling: Secondary | ICD-10-CM | POA: Diagnosis not present

## 2020-09-08 DIAGNOSIS — R2689 Other abnormalities of gait and mobility: Secondary | ICD-10-CM | POA: Diagnosis not present

## 2020-09-08 DIAGNOSIS — Z20828 Contact with and (suspected) exposure to other viral communicable diseases: Secondary | ICD-10-CM | POA: Diagnosis not present

## 2020-09-08 DIAGNOSIS — Z1159 Encounter for screening for other viral diseases: Secondary | ICD-10-CM | POA: Diagnosis not present

## 2020-09-08 DIAGNOSIS — R2681 Unsteadiness on feet: Secondary | ICD-10-CM | POA: Diagnosis not present

## 2020-09-08 DIAGNOSIS — M6281 Muscle weakness (generalized): Secondary | ICD-10-CM | POA: Diagnosis not present

## 2020-09-08 DIAGNOSIS — R278 Other lack of coordination: Secondary | ICD-10-CM | POA: Diagnosis not present

## 2020-09-09 DIAGNOSIS — E559 Vitamin D deficiency, unspecified: Secondary | ICD-10-CM | POA: Diagnosis not present

## 2020-09-09 DIAGNOSIS — E538 Deficiency of other specified B group vitamins: Secondary | ICD-10-CM | POA: Diagnosis not present

## 2020-09-09 DIAGNOSIS — S81811A Laceration without foreign body, right lower leg, initial encounter: Secondary | ICD-10-CM | POA: Diagnosis not present

## 2020-09-09 DIAGNOSIS — Z79899 Other long term (current) drug therapy: Secondary | ICD-10-CM | POA: Diagnosis not present

## 2020-09-09 DIAGNOSIS — E782 Mixed hyperlipidemia: Secondary | ICD-10-CM | POA: Diagnosis not present

## 2020-09-10 DIAGNOSIS — R2681 Unsteadiness on feet: Secondary | ICD-10-CM | POA: Diagnosis not present

## 2020-09-10 DIAGNOSIS — R278 Other lack of coordination: Secondary | ICD-10-CM | POA: Diagnosis not present

## 2020-09-10 DIAGNOSIS — M6281 Muscle weakness (generalized): Secondary | ICD-10-CM | POA: Diagnosis not present

## 2020-09-10 DIAGNOSIS — Z9181 History of falling: Secondary | ICD-10-CM | POA: Diagnosis not present

## 2020-09-10 DIAGNOSIS — R2689 Other abnormalities of gait and mobility: Secondary | ICD-10-CM | POA: Diagnosis not present

## 2020-09-11 DIAGNOSIS — Z1159 Encounter for screening for other viral diseases: Secondary | ICD-10-CM | POA: Diagnosis not present

## 2020-09-11 DIAGNOSIS — R2689 Other abnormalities of gait and mobility: Secondary | ICD-10-CM | POA: Diagnosis not present

## 2020-09-11 DIAGNOSIS — Z20828 Contact with and (suspected) exposure to other viral communicable diseases: Secondary | ICD-10-CM | POA: Diagnosis not present

## 2020-09-11 DIAGNOSIS — R278 Other lack of coordination: Secondary | ICD-10-CM | POA: Diagnosis not present

## 2020-09-11 DIAGNOSIS — R2681 Unsteadiness on feet: Secondary | ICD-10-CM | POA: Diagnosis not present

## 2020-09-11 DIAGNOSIS — M6281 Muscle weakness (generalized): Secondary | ICD-10-CM | POA: Diagnosis not present

## 2020-09-11 DIAGNOSIS — Z9181 History of falling: Secondary | ICD-10-CM | POA: Diagnosis not present

## 2020-09-14 DIAGNOSIS — M6281 Muscle weakness (generalized): Secondary | ICD-10-CM | POA: Diagnosis not present

## 2020-09-14 DIAGNOSIS — R2689 Other abnormalities of gait and mobility: Secondary | ICD-10-CM | POA: Diagnosis not present

## 2020-09-14 DIAGNOSIS — R2681 Unsteadiness on feet: Secondary | ICD-10-CM | POA: Diagnosis not present

## 2020-09-14 DIAGNOSIS — Z9181 History of falling: Secondary | ICD-10-CM | POA: Diagnosis not present

## 2020-09-14 DIAGNOSIS — R278 Other lack of coordination: Secondary | ICD-10-CM | POA: Diagnosis not present

## 2020-09-15 DIAGNOSIS — R2689 Other abnormalities of gait and mobility: Secondary | ICD-10-CM | POA: Diagnosis not present

## 2020-09-15 DIAGNOSIS — Z9181 History of falling: Secondary | ICD-10-CM | POA: Diagnosis not present

## 2020-09-15 DIAGNOSIS — Z20828 Contact with and (suspected) exposure to other viral communicable diseases: Secondary | ICD-10-CM | POA: Diagnosis not present

## 2020-09-15 DIAGNOSIS — M6281 Muscle weakness (generalized): Secondary | ICD-10-CM | POA: Diagnosis not present

## 2020-09-15 DIAGNOSIS — Z1159 Encounter for screening for other viral diseases: Secondary | ICD-10-CM | POA: Diagnosis not present

## 2020-09-15 DIAGNOSIS — R278 Other lack of coordination: Secondary | ICD-10-CM | POA: Diagnosis not present

## 2020-09-15 DIAGNOSIS — R2681 Unsteadiness on feet: Secondary | ICD-10-CM | POA: Diagnosis not present

## 2020-09-17 DIAGNOSIS — Z9181 History of falling: Secondary | ICD-10-CM | POA: Diagnosis not present

## 2020-09-17 DIAGNOSIS — R278 Other lack of coordination: Secondary | ICD-10-CM | POA: Diagnosis not present

## 2020-09-17 DIAGNOSIS — R2689 Other abnormalities of gait and mobility: Secondary | ICD-10-CM | POA: Diagnosis not present

## 2020-09-17 DIAGNOSIS — R2681 Unsteadiness on feet: Secondary | ICD-10-CM | POA: Diagnosis not present

## 2020-09-17 DIAGNOSIS — M6281 Muscle weakness (generalized): Secondary | ICD-10-CM | POA: Diagnosis not present

## 2020-09-18 DIAGNOSIS — Z9181 History of falling: Secondary | ICD-10-CM | POA: Diagnosis not present

## 2020-09-18 DIAGNOSIS — Z1159 Encounter for screening for other viral diseases: Secondary | ICD-10-CM | POA: Diagnosis not present

## 2020-09-18 DIAGNOSIS — Z20828 Contact with and (suspected) exposure to other viral communicable diseases: Secondary | ICD-10-CM | POA: Diagnosis not present

## 2020-09-18 DIAGNOSIS — M6281 Muscle weakness (generalized): Secondary | ICD-10-CM | POA: Diagnosis not present

## 2020-09-18 DIAGNOSIS — R278 Other lack of coordination: Secondary | ICD-10-CM | POA: Diagnosis not present

## 2020-09-18 DIAGNOSIS — R2689 Other abnormalities of gait and mobility: Secondary | ICD-10-CM | POA: Diagnosis not present

## 2020-09-18 DIAGNOSIS — R2681 Unsteadiness on feet: Secondary | ICD-10-CM | POA: Diagnosis not present

## 2020-09-21 DIAGNOSIS — Z9181 History of falling: Secondary | ICD-10-CM | POA: Diagnosis not present

## 2020-09-21 DIAGNOSIS — R2681 Unsteadiness on feet: Secondary | ICD-10-CM | POA: Diagnosis not present

## 2020-09-21 DIAGNOSIS — R278 Other lack of coordination: Secondary | ICD-10-CM | POA: Diagnosis not present

## 2020-09-21 DIAGNOSIS — R2689 Other abnormalities of gait and mobility: Secondary | ICD-10-CM | POA: Diagnosis not present

## 2020-09-21 DIAGNOSIS — M6281 Muscle weakness (generalized): Secondary | ICD-10-CM | POA: Diagnosis not present

## 2020-09-22 DIAGNOSIS — Z9181 History of falling: Secondary | ICD-10-CM | POA: Diagnosis not present

## 2020-09-22 DIAGNOSIS — Z1159 Encounter for screening for other viral diseases: Secondary | ICD-10-CM | POA: Diagnosis not present

## 2020-09-22 DIAGNOSIS — Z20828 Contact with and (suspected) exposure to other viral communicable diseases: Secondary | ICD-10-CM | POA: Diagnosis not present

## 2020-09-22 DIAGNOSIS — R2689 Other abnormalities of gait and mobility: Secondary | ICD-10-CM | POA: Diagnosis not present

## 2020-09-22 DIAGNOSIS — R2681 Unsteadiness on feet: Secondary | ICD-10-CM | POA: Diagnosis not present

## 2020-09-22 DIAGNOSIS — M6281 Muscle weakness (generalized): Secondary | ICD-10-CM | POA: Diagnosis not present

## 2020-09-22 DIAGNOSIS — R278 Other lack of coordination: Secondary | ICD-10-CM | POA: Diagnosis not present

## 2020-09-23 DIAGNOSIS — M6281 Muscle weakness (generalized): Secondary | ICD-10-CM | POA: Diagnosis not present

## 2020-09-23 DIAGNOSIS — R2681 Unsteadiness on feet: Secondary | ICD-10-CM | POA: Diagnosis not present

## 2020-09-23 DIAGNOSIS — Z9181 History of falling: Secondary | ICD-10-CM | POA: Diagnosis not present

## 2020-09-23 DIAGNOSIS — R2689 Other abnormalities of gait and mobility: Secondary | ICD-10-CM | POA: Diagnosis not present

## 2020-09-23 DIAGNOSIS — R278 Other lack of coordination: Secondary | ICD-10-CM | POA: Diagnosis not present

## 2020-09-24 DIAGNOSIS — E782 Mixed hyperlipidemia: Secondary | ICD-10-CM | POA: Diagnosis not present

## 2020-09-24 DIAGNOSIS — M6281 Muscle weakness (generalized): Secondary | ICD-10-CM | POA: Diagnosis not present

## 2020-09-24 DIAGNOSIS — R2689 Other abnormalities of gait and mobility: Secondary | ICD-10-CM | POA: Diagnosis not present

## 2020-09-24 DIAGNOSIS — Z9989 Dependence on other enabling machines and devices: Secondary | ICD-10-CM | POA: Diagnosis not present

## 2020-09-24 DIAGNOSIS — R278 Other lack of coordination: Secondary | ICD-10-CM | POA: Diagnosis not present

## 2020-09-24 DIAGNOSIS — R2681 Unsteadiness on feet: Secondary | ICD-10-CM | POA: Diagnosis not present

## 2020-09-24 DIAGNOSIS — Z79899 Other long term (current) drug therapy: Secondary | ICD-10-CM | POA: Diagnosis not present

## 2020-09-24 DIAGNOSIS — Z9181 History of falling: Secondary | ICD-10-CM | POA: Diagnosis not present

## 2020-09-24 DIAGNOSIS — E538 Deficiency of other specified B group vitamins: Secondary | ICD-10-CM | POA: Diagnosis not present

## 2020-09-25 DIAGNOSIS — R278 Other lack of coordination: Secondary | ICD-10-CM | POA: Diagnosis not present

## 2020-09-25 DIAGNOSIS — Z9181 History of falling: Secondary | ICD-10-CM | POA: Diagnosis not present

## 2020-09-25 DIAGNOSIS — Z1159 Encounter for screening for other viral diseases: Secondary | ICD-10-CM | POA: Diagnosis not present

## 2020-09-25 DIAGNOSIS — M6281 Muscle weakness (generalized): Secondary | ICD-10-CM | POA: Diagnosis not present

## 2020-09-25 DIAGNOSIS — Z20828 Contact with and (suspected) exposure to other viral communicable diseases: Secondary | ICD-10-CM | POA: Diagnosis not present

## 2020-09-25 DIAGNOSIS — R2689 Other abnormalities of gait and mobility: Secondary | ICD-10-CM | POA: Diagnosis not present

## 2020-09-25 DIAGNOSIS — R2681 Unsteadiness on feet: Secondary | ICD-10-CM | POA: Diagnosis not present

## 2020-09-26 DIAGNOSIS — R2681 Unsteadiness on feet: Secondary | ICD-10-CM | POA: Diagnosis not present

## 2020-09-26 DIAGNOSIS — Z9181 History of falling: Secondary | ICD-10-CM | POA: Diagnosis not present

## 2020-09-26 DIAGNOSIS — R2689 Other abnormalities of gait and mobility: Secondary | ICD-10-CM | POA: Diagnosis not present

## 2020-09-26 DIAGNOSIS — R278 Other lack of coordination: Secondary | ICD-10-CM | POA: Diagnosis not present

## 2020-09-26 DIAGNOSIS — M6281 Muscle weakness (generalized): Secondary | ICD-10-CM | POA: Diagnosis not present

## 2020-09-29 DIAGNOSIS — R2681 Unsteadiness on feet: Secondary | ICD-10-CM | POA: Diagnosis not present

## 2020-09-29 DIAGNOSIS — Z9181 History of falling: Secondary | ICD-10-CM | POA: Diagnosis not present

## 2020-09-29 DIAGNOSIS — M6281 Muscle weakness (generalized): Secondary | ICD-10-CM | POA: Diagnosis not present

## 2020-09-29 DIAGNOSIS — Z20828 Contact with and (suspected) exposure to other viral communicable diseases: Secondary | ICD-10-CM | POA: Diagnosis not present

## 2020-09-29 DIAGNOSIS — R278 Other lack of coordination: Secondary | ICD-10-CM | POA: Diagnosis not present

## 2020-09-29 DIAGNOSIS — Z1159 Encounter for screening for other viral diseases: Secondary | ICD-10-CM | POA: Diagnosis not present

## 2020-09-29 DIAGNOSIS — R2689 Other abnormalities of gait and mobility: Secondary | ICD-10-CM | POA: Diagnosis not present

## 2020-09-30 DIAGNOSIS — R278 Other lack of coordination: Secondary | ICD-10-CM | POA: Diagnosis not present

## 2020-09-30 DIAGNOSIS — M6281 Muscle weakness (generalized): Secondary | ICD-10-CM | POA: Diagnosis not present

## 2020-09-30 DIAGNOSIS — R2689 Other abnormalities of gait and mobility: Secondary | ICD-10-CM | POA: Diagnosis not present

## 2020-09-30 DIAGNOSIS — R2681 Unsteadiness on feet: Secondary | ICD-10-CM | POA: Diagnosis not present

## 2020-09-30 DIAGNOSIS — Z9181 History of falling: Secondary | ICD-10-CM | POA: Diagnosis not present

## 2020-10-01 DIAGNOSIS — R278 Other lack of coordination: Secondary | ICD-10-CM | POA: Diagnosis not present

## 2020-10-01 DIAGNOSIS — Z9181 History of falling: Secondary | ICD-10-CM | POA: Diagnosis not present

## 2020-10-01 DIAGNOSIS — R2689 Other abnormalities of gait and mobility: Secondary | ICD-10-CM | POA: Diagnosis not present

## 2020-10-01 DIAGNOSIS — M6281 Muscle weakness (generalized): Secondary | ICD-10-CM | POA: Diagnosis not present

## 2020-10-01 DIAGNOSIS — R2681 Unsteadiness on feet: Secondary | ICD-10-CM | POA: Diagnosis not present

## 2020-10-02 DIAGNOSIS — R2681 Unsteadiness on feet: Secondary | ICD-10-CM | POA: Diagnosis not present

## 2020-10-02 DIAGNOSIS — R2689 Other abnormalities of gait and mobility: Secondary | ICD-10-CM | POA: Diagnosis not present

## 2020-10-02 DIAGNOSIS — Z20828 Contact with and (suspected) exposure to other viral communicable diseases: Secondary | ICD-10-CM | POA: Diagnosis not present

## 2020-10-02 DIAGNOSIS — Z1159 Encounter for screening for other viral diseases: Secondary | ICD-10-CM | POA: Diagnosis not present

## 2020-10-02 DIAGNOSIS — Z9181 History of falling: Secondary | ICD-10-CM | POA: Diagnosis not present

## 2020-10-02 DIAGNOSIS — M6281 Muscle weakness (generalized): Secondary | ICD-10-CM | POA: Diagnosis not present

## 2020-10-02 DIAGNOSIS — R278 Other lack of coordination: Secondary | ICD-10-CM | POA: Diagnosis not present

## 2020-10-06 DIAGNOSIS — M6281 Muscle weakness (generalized): Secondary | ICD-10-CM | POA: Diagnosis not present

## 2020-10-06 DIAGNOSIS — Z1159 Encounter for screening for other viral diseases: Secondary | ICD-10-CM | POA: Diagnosis not present

## 2020-10-06 DIAGNOSIS — Z20828 Contact with and (suspected) exposure to other viral communicable diseases: Secondary | ICD-10-CM | POA: Diagnosis not present

## 2020-10-06 DIAGNOSIS — R2689 Other abnormalities of gait and mobility: Secondary | ICD-10-CM | POA: Diagnosis not present

## 2020-10-06 DIAGNOSIS — R278 Other lack of coordination: Secondary | ICD-10-CM | POA: Diagnosis not present

## 2020-10-06 DIAGNOSIS — Z9181 History of falling: Secondary | ICD-10-CM | POA: Diagnosis not present

## 2020-10-06 DIAGNOSIS — R2681 Unsteadiness on feet: Secondary | ICD-10-CM | POA: Diagnosis not present

## 2020-10-07 DIAGNOSIS — R634 Abnormal weight loss: Secondary | ICD-10-CM | POA: Diagnosis not present

## 2020-10-07 DIAGNOSIS — D7589 Other specified diseases of blood and blood-forming organs: Secondary | ICD-10-CM | POA: Diagnosis not present

## 2020-10-07 DIAGNOSIS — D631 Anemia in chronic kidney disease: Secondary | ICD-10-CM | POA: Diagnosis not present

## 2020-10-07 DIAGNOSIS — R2689 Other abnormalities of gait and mobility: Secondary | ICD-10-CM | POA: Diagnosis not present

## 2020-10-07 DIAGNOSIS — E538 Deficiency of other specified B group vitamins: Secondary | ICD-10-CM | POA: Diagnosis not present

## 2020-10-07 DIAGNOSIS — R2681 Unsteadiness on feet: Secondary | ICD-10-CM | POA: Diagnosis not present

## 2020-10-07 DIAGNOSIS — Z9181 History of falling: Secondary | ICD-10-CM | POA: Diagnosis not present

## 2020-10-07 DIAGNOSIS — E7849 Other hyperlipidemia: Secondary | ICD-10-CM | POA: Diagnosis not present

## 2020-10-07 DIAGNOSIS — M6281 Muscle weakness (generalized): Secondary | ICD-10-CM | POA: Diagnosis not present

## 2020-10-07 DIAGNOSIS — R278 Other lack of coordination: Secondary | ICD-10-CM | POA: Diagnosis not present

## 2020-10-08 DIAGNOSIS — R2689 Other abnormalities of gait and mobility: Secondary | ICD-10-CM | POA: Diagnosis not present

## 2020-10-08 DIAGNOSIS — R2681 Unsteadiness on feet: Secondary | ICD-10-CM | POA: Diagnosis not present

## 2020-10-08 DIAGNOSIS — M6281 Muscle weakness (generalized): Secondary | ICD-10-CM | POA: Diagnosis not present

## 2020-10-08 DIAGNOSIS — R278 Other lack of coordination: Secondary | ICD-10-CM | POA: Diagnosis not present

## 2020-10-08 DIAGNOSIS — Z9181 History of falling: Secondary | ICD-10-CM | POA: Diagnosis not present

## 2020-10-09 DIAGNOSIS — Z1159 Encounter for screening for other viral diseases: Secondary | ICD-10-CM | POA: Diagnosis not present

## 2020-10-09 DIAGNOSIS — R2681 Unsteadiness on feet: Secondary | ICD-10-CM | POA: Diagnosis not present

## 2020-10-09 DIAGNOSIS — Z9181 History of falling: Secondary | ICD-10-CM | POA: Diagnosis not present

## 2020-10-09 DIAGNOSIS — M6281 Muscle weakness (generalized): Secondary | ICD-10-CM | POA: Diagnosis not present

## 2020-10-09 DIAGNOSIS — R2689 Other abnormalities of gait and mobility: Secondary | ICD-10-CM | POA: Diagnosis not present

## 2020-10-09 DIAGNOSIS — R278 Other lack of coordination: Secondary | ICD-10-CM | POA: Diagnosis not present

## 2020-10-09 DIAGNOSIS — Z20828 Contact with and (suspected) exposure to other viral communicable diseases: Secondary | ICD-10-CM | POA: Diagnosis not present

## 2020-10-11 IMAGING — CT CT CERVICAL SPINE W/O CM
2 series · 10 of 14 positions shown, 12 images · non-contrast
Comparison: 01/29/2020

CLINICAL DATA: C2 fracture, follow-up

EXAM:
CT CERVICAL SPINE WITHOUT CONTRAST
TECHNIQUE: Multidetector CT imaging of the cervical spine was performed without
intravenous contrast. Multiplanar CT image reconstructions were also
generated.

[Series 3: cspine soft · axial · 0.35mm/px · z∈[-192,-88]mm · 5 of 78 slices shown]
[im 13/78  soft-tissue]
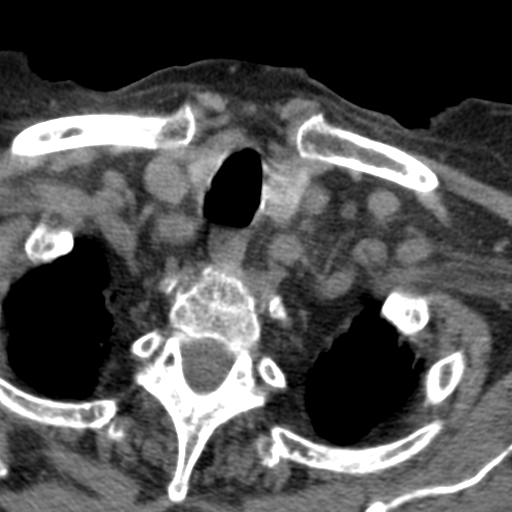
[im 26/78  soft-tissue]
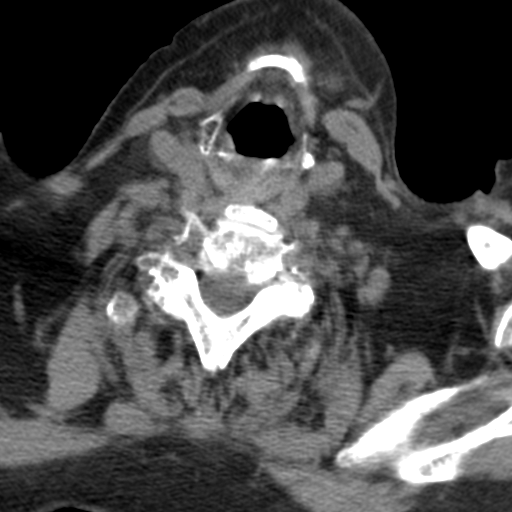
[im 39/78  soft-tissue]
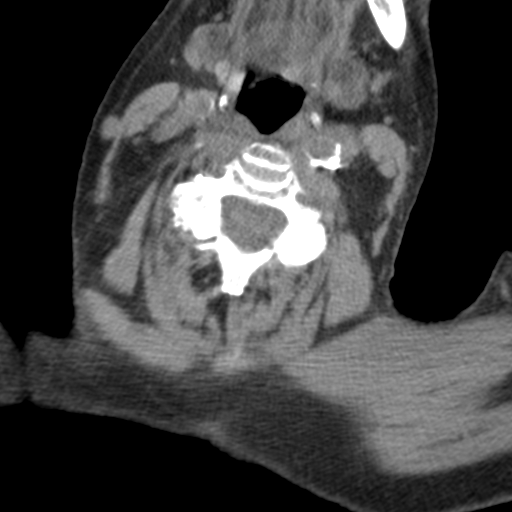
[im 52/78  soft-tissue]
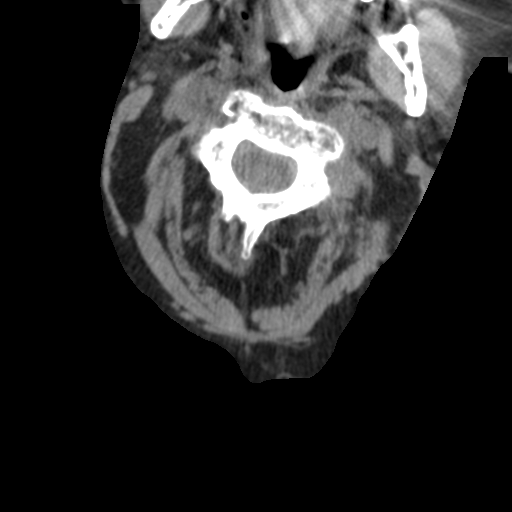
[im 65/78  soft-tissue]
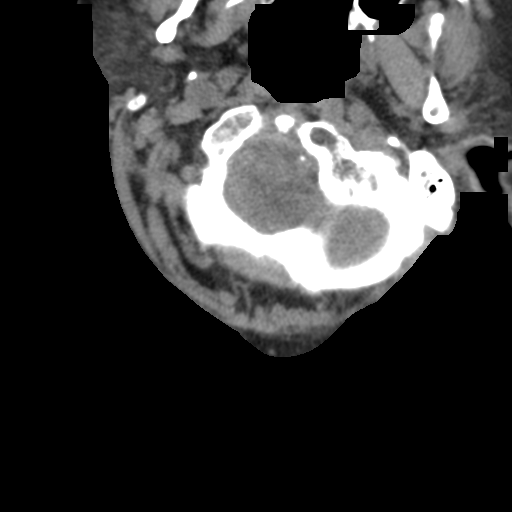

[Series 9: angled axial · axial · 0.24mm/px · z∈[-210,-118]mm · 5 of 78 slices shown, 7 images]
[im 13/78  soft-tissue]
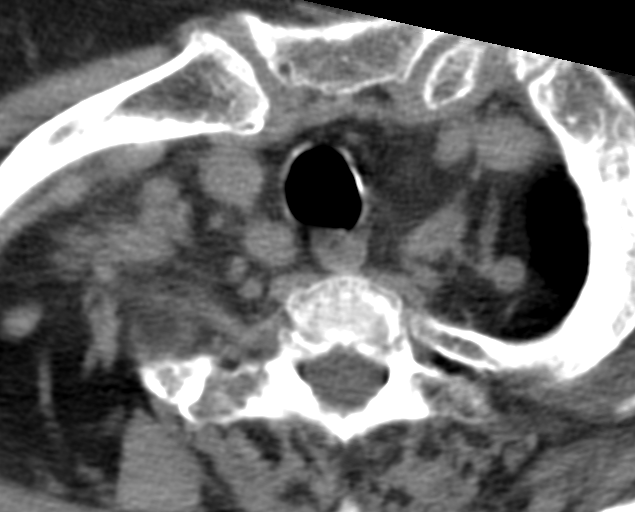
[im 13/78  bone]
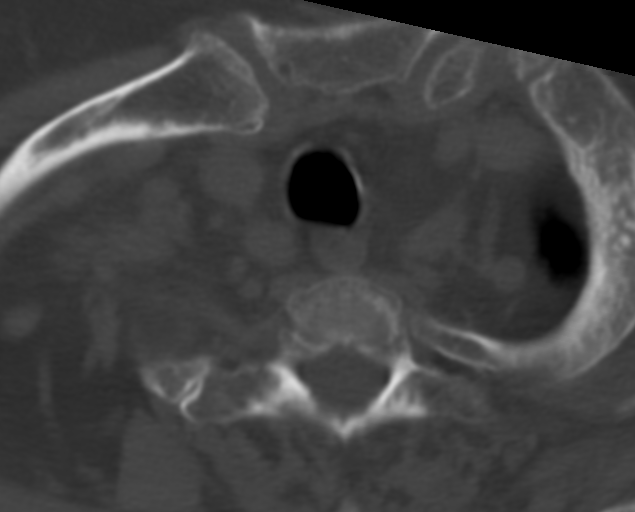
[im 26/78  bone]
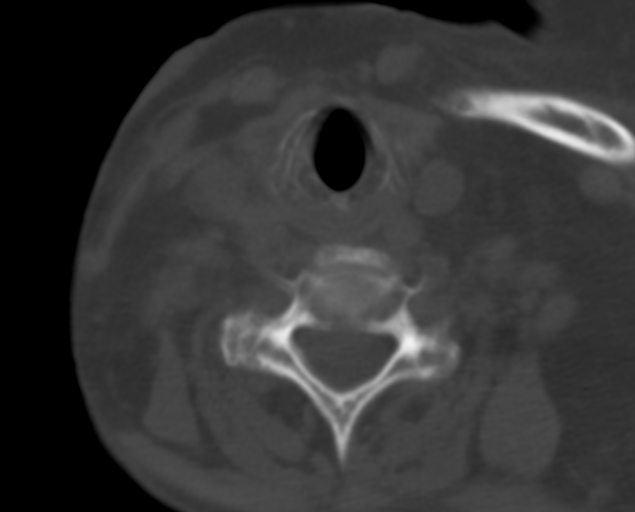
[im 39/78  bone]
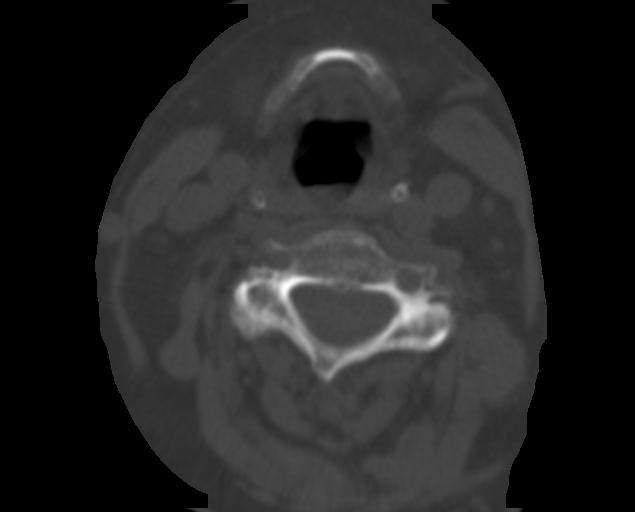
[im 52/78  bone]
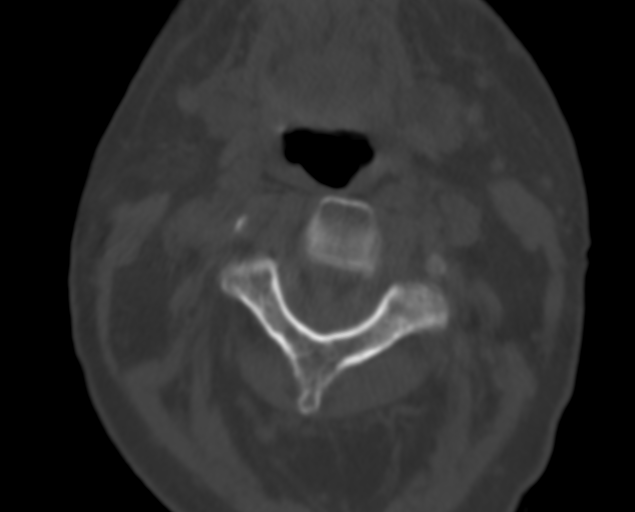
[im 65/78  soft-tissue]
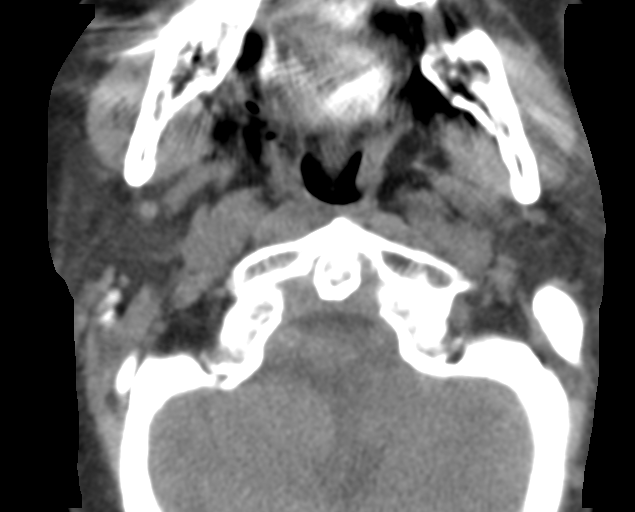
[im 65/78  bone]
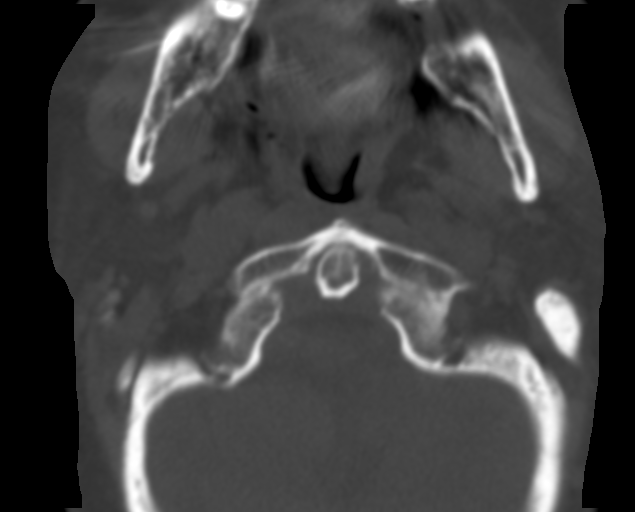

[10 of 14 positions shown; findings below may reference images not displayed]

FINDINGS: Alignment: Stable. There is no evidence of significantly increased
displacement or angulation of the dens.

Skull base and vertebrae: Fracture line through the base of the dens
remains present. Stable vertebral body heights.

Soft tissues and spinal canal: No prevertebral fluid or swelling. No
visible epidural collection.

Disc levels: Multilevel degenerative changes are stable compared to
the recent prior study.

Upper chest: Negative.

Other: None.
IMPRESSION: Nonunited dens fracture.  Alignment is stable.

## 2020-10-12 DIAGNOSIS — R278 Other lack of coordination: Secondary | ICD-10-CM | POA: Diagnosis not present

## 2020-10-12 DIAGNOSIS — R2681 Unsteadiness on feet: Secondary | ICD-10-CM | POA: Diagnosis not present

## 2020-10-12 DIAGNOSIS — Z9181 History of falling: Secondary | ICD-10-CM | POA: Diagnosis not present

## 2020-10-12 DIAGNOSIS — R2689 Other abnormalities of gait and mobility: Secondary | ICD-10-CM | POA: Diagnosis not present

## 2020-10-12 DIAGNOSIS — M6281 Muscle weakness (generalized): Secondary | ICD-10-CM | POA: Diagnosis not present

## 2020-10-13 ENCOUNTER — Ambulatory Visit: Payer: Medicare Other | Admitting: Cardiology

## 2020-10-13 ENCOUNTER — Ambulatory Visit (INDEPENDENT_AMBULATORY_CARE_PROVIDER_SITE_OTHER): Payer: Medicare Other | Admitting: Cardiology

## 2020-10-13 ENCOUNTER — Encounter: Payer: Self-pay | Admitting: Cardiology

## 2020-10-13 ENCOUNTER — Other Ambulatory Visit: Payer: Self-pay

## 2020-10-13 VITALS — BP 136/60 | HR 83 | Ht 66.0 in | Wt 160.0 lb

## 2020-10-13 DIAGNOSIS — I4819 Other persistent atrial fibrillation: Secondary | ICD-10-CM

## 2020-10-13 DIAGNOSIS — N1831 Chronic kidney disease, stage 3a: Secondary | ICD-10-CM

## 2020-10-13 DIAGNOSIS — I5032 Chronic diastolic (congestive) heart failure: Secondary | ICD-10-CM

## 2020-10-13 DIAGNOSIS — R600 Localized edema: Secondary | ICD-10-CM

## 2020-10-13 DIAGNOSIS — I10 Essential (primary) hypertension: Secondary | ICD-10-CM

## 2020-10-13 DIAGNOSIS — R278 Other lack of coordination: Secondary | ICD-10-CM | POA: Diagnosis not present

## 2020-10-13 DIAGNOSIS — R2689 Other abnormalities of gait and mobility: Secondary | ICD-10-CM | POA: Diagnosis not present

## 2020-10-13 DIAGNOSIS — M6281 Muscle weakness (generalized): Secondary | ICD-10-CM | POA: Diagnosis not present

## 2020-10-13 DIAGNOSIS — Z20828 Contact with and (suspected) exposure to other viral communicable diseases: Secondary | ICD-10-CM | POA: Diagnosis not present

## 2020-10-13 DIAGNOSIS — R2681 Unsteadiness on feet: Secondary | ICD-10-CM | POA: Diagnosis not present

## 2020-10-13 DIAGNOSIS — Z9181 History of falling: Secondary | ICD-10-CM | POA: Diagnosis not present

## 2020-10-13 DIAGNOSIS — Z1159 Encounter for screening for other viral diseases: Secondary | ICD-10-CM | POA: Diagnosis not present

## 2020-10-13 DIAGNOSIS — R0602 Shortness of breath: Secondary | ICD-10-CM | POA: Diagnosis not present

## 2020-10-13 NOTE — Progress Notes (Signed)
Cardiology Office Note:    Date:  10/13/2020   ID:  SHYNA Morgan, DOB Jan 23, 1933, MRN [id number]  PCP:  Orvis Brill, [facility]  Cardiologist:  Fransico Him, MD    Referring MD: No ref. provider found   Chief Complaint  Patient presents with  . Congestive Heart Failure  . Hypertension  . Atrial Fibrillation    History of Present Illness:    Christina Morgan is a 86 y.o. female with a hx of  paroxysmal atrial fibrillation on Eliquis and flecainide, hypertension, hyperlipidemia, and chronic lower extremity edema. Shealso has a history of chronic diastolic CHF withEchoshowingLVEF 55-60% with elevated ventricular filling pressures and atrial filling pressures, trivial pericardial effusionand moderate aortic insufficiency.She also haschronic lower extremity edema with chronic venous insufficiency exacerbated by higher doses of amlodipine and sitting all day.She has chronic DOE that is felt to be due to sedentary state and deconditioned.   She is here today for followup and is doing well.  She has chronic DOE that is felt to be multifactorial due to obesity and sedentary state and is very stable.  She has chronic LE edema which has been very stable recently except for this am after having therapy.  The compression hose have helped significantly.  She denies any chest pain or pressure, PND, orthopnea,  palpitations or syncope. No significant dizzy spells. She has not had any further falls since I saw her last.  She is compliant with her meds and is tolerating meds with no SE.    Past Medical History:  Diagnosis Date  . Cancer [facility])    endometrial ca  . Chronic diastolic CHF (congestive heart failure) ([place])   . Chronic edema   . CKD (chronic kidney disease), stage III ([place])   . Dyslipidemia   . Fracture of left humerus   . History of cardiovascular stress test    Lexiscan Myoview (06/2014): No ischemia or scar, not gated, low risk  . Hx of echocardiogram    Echo  (05/02/14): EF 60% to 65%. No regional wall motion abnormalities. Mild AI, mildly dilated aortic root (37 mm), trivial MR, trivial TR  . Hyperlipidemia   . Hypertension   . Lipoma of skin    multiple  . Osteopenia   . PAF (paroxysmal atrial fibrillation) ([place])    failed DCCV 05/2014 >> Flecainide started >> DCCV 7/15 sucessful;  f/u ETT neg for pro-arrhythmia >> recurrent AF/AFL >> Flecainide inc to 100 bid with repeat DCCV 9/15  . Vitamin D deficiency 09/25/2019    Past Surgical History:  Procedure Laterality Date  . CARDIOVERSION N/A 06/05/2014   Procedure: CARDIOVERSION;  Surgeon: Sueanne Margarita, MD;  Location: [place];  Service: Cardiovascular;  Laterality: N/A;  . CARDIOVERSION N/A 07/11/2014   Procedure: CARDIOVERSION;  Surgeon: Sueanne Margarita, MD;  Location: [facility] ENDOSCOPY;  Service: Cardiovascular;  Laterality: N/A;  . CARDIOVERSION N/A 09/04/2014   Procedure: CARDIOVERSION;  Surgeon: Sueanne Margarita, MD;  Location: [facility] ENDOSCOPY;  Service: Cardiovascular;  Laterality: N/A;  . COLONOSCOPY  2008  . COLONOSCOPY N/A 05/20/2015   Procedure: COLONOSCOPY;  Surgeon: Ronald Lobo, MD;  Location: [facility] ENDOSCOPY;  Service: Endoscopy;  Laterality: N/A;  . FEMUR IM NAIL Right 09/24/2019   Procedure: INTRAMEDULLARY (IM) NAIL FEMORAL;  Surgeon: Altamese [place], MD;  Location: [place];  Service: Orthopedics;  Laterality: Right;  . IR KYPHO EA ADDL LEVEL THORACIC OR LUMBAR  04/12/2019  . IR KYPHO THORACIC WITH BONE BIOPSY  04/12/2019  . ROBOTIC  ASSISTED SUPRACERVICAL HYSTERECTOMY WITH BILATERAL SALPINGO OOPHERECTOMY  11/06/2012   and bilateral pelvic lymph node dissection    Current Medications: Current Meds  Medication Sig  . acetaminophen (TYLENOL) 325 MG tablet Take 1-2 tablets (325-650 mg total) by mouth every 6 (six) hours as needed for mild pain (pain score 1-3 or temp > 100.5).  Marland Kitchen albuterol (VENTOLIN HFA) 108 (90 Base) MCG/ACT inhaler Inhale 2 puffs into the lungs every 4 (four) hours as needed  for wheezing or shortness of breath (please try to use ALbuterol inhaler PRIOR to [place]).  Marland Kitchen apixaban (ELIQUIS) 2.5 MG TABS tablet Take 2.5 mg by mouth 2 (two) times daily.  Marland Kitchen atenolol (TENORMIN) 25 MG tablet Take 1 tablet (25 mg total) by mouth 2 (two) times daily.  Marland Kitchen atorvastatin (LIPITOR) 10 MG tablet Take 1 tablet (10 mg total) by mouth at bedtime.  Marland Kitchen [place] Saline Nasal No-Drip GEL Place 1 application into both nostrils 2 (two) times daily.  Marland Kitchen docusate sodium (COLACE) 100 MG capsule Take 1 capsule (100 mg total) by mouth 2 (two) times daily. (Patient taking differently: Take 100 mg by mouth daily as needed for mild constipation. )  . flecainide (TAMBOCOR) 100 MG tablet Take 1 tablet (100 mg total) by mouth 2 (two) times daily.  Marland Kitchen lisinopril (ZESTRIL) 10 MG tablet Take 10 mg by mouth daily.  . Multiple Vitamin (DAILY VITE PO) Take 1 tablet by mouth as directed.  . NYSTATIN powder Apply 1 g topically See admin instructions. Spread topically to area under bilateral breasts & groin twice a day at 8 AM and 8 PM  . potassium chloride SA (K-DUR) 20 MEQ tablet Take 1 tablet (20 mEq total) by mouth 2 (two) times daily.  . tamoxifen (NOLVADEX) 20 MG tablet Take 20 mg by mouth daily.  Marland Kitchen torsemide (DEMADEX) 10 MG tablet Take 1 tablet (10 mg total) by mouth daily.  . vitamin B-12 (CYANOCOBALAMIN) 250 MCG tablet Take 250 mcg by mouth daily.  . vitamin C (VITAMIN C) 500 MG tablet Take 1 tablet (500 mg total) by mouth daily.  . Vitamin D, Ergocalciferol, (DRISDOL) 1.25 MG (50000 UT) CAPS capsule Take 1 capsule (50,000 Units total) by mouth every 7 (seven) days.     Allergies:   Penicillins   Social History   Socioeconomic History  . Marital status: Widowed    Spouse name: Not on file  . Number of children: Not on file  . Years of education: Not on file  . Highest education level: Not on file  Occupational History  . Not on file  Tobacco Use  . Smoking status: Never Smoker  . Smokeless tobacco: Never  Used  Vaping Use  . Vaping Use: Never used  Substance and Sexual Activity  . Alcohol use: No  . Drug use: No  . Sexual activity: Not on file  Other Topics Concern  . Not on file  Social History Narrative   ** Merged History Encounter **       Social Determinants of Health   Financial Resource Strain:   . Difficulty of Paying Living Expenses: Not on file  Food Insecurity:   . Worried About Charity fundraiser in the Last Year: Not on file  . Ran Out of Food in the Last Year: Not on file  Transportation Needs:   . Lack of Transportation (Medical): Not on file  . Lack of Transportation (Non-Medical): Not on file  Physical Activity:   . Days of Exercise per  Week: Not on file  . Minutes of Exercise per Session: Not on file  Stress:   . Feeling of Stress : Not on file  Social Connections:   . Frequency of Communication with Friends and Family: Not on file  . Frequency of Social Gatherings with Friends and Family: Not on file  . Attends Religious Services: Not on file  . Active Member of Clubs or Organizations: Not on file  . Attends Archivist Meetings: Not on file  . Marital Status: Not on file     Family History: The patient's family history includes Cancer (age of onset: 53) in her father; Coronary artery disease in her mother; Heart attack in her mother.  ROS:   Please see the history of present illness.    ROS  All other systems reviewed and negative.   EKGs/Labs/Other Studies Reviewed:    The following studies were reviewed today: nond  EKG:  EKG is not  ordered today.    Recent Labs: 02/11/2020: ALT 15; B Natriuretic Peptide 329.3 02/12/2020: TSH 2.620 02/16/2020: BUN 23; Creatinine, Ser 1.38; Hemoglobin 9.9; Platelets 292; Potassium 4.1; Sodium 141   Recent Lipid Panel    Component Value Date/Time   CHOL 175 03/21/2016 0915   TRIG 155 (H) 03/21/2016 0915   HDL 63 03/21/2016 0915   CHOLHDL 2.8 03/21/2016 0915   VLDL 31 (H) 03/21/2016 0915    LDLCALC 81 03/21/2016 0915    Physical Exam:    VS:  BP 136/60   Pulse 83   Ht 5\' 6"  (1.676 m)   Wt 160 lb (72.6 kg)   SpO2 99%   BMI 25.82 kg/m     Wt Readings from Last 3 Encounters:  10/13/20 160 lb (72.6 kg)  04/09/20 178 lb 3.2 oz (80.8 kg)  02/12/20 172 lb 13.5 oz (78.4 kg)     GEN: Well nourished, well developed in no acute distress HEENT: Normal NECK: No JVD; No carotid bruits LYMPHATICS: No lymphadenopathy CARDIAC:RRR, no murmurs, rubs, gallops RESPIRATORY:  Clear to auscultation without rales, wheezing or rhonchi  ABDOMEN: Soft, non-tender, non-distended MUSCULOSKELETAL: mild RLE edema; No deformity  SKIN: Warm and dry NEUROLOGIC:  Alert and oriented x 3 PSYCHIATRIC:  Normal affect    ASSESSMENT:    1. Chronic diastolic heart failure (HCC)   2. Persistent atrial fibrillation ([place])   3. Essential hypertension, benign   4. Lower extremity edema   5. SOB (shortness of breath)   6. Stage 3a chronic kidney disease ([place])    PLAN:    In order of problems listed above:  1.  Chronic diastolic CHF -2D echo with normal LVF and diastolic dysfunction -now on Torsemide 10mg  daily  -has chronic LE edema secondary to chronic venous insuff and chronic dependent state -she appears euvolemic on exam today  -no change in diuretic dose at this time -SCr 1.38 in March 2021 -repeat BMET  2.  Persistent atrial fibrillation -maintaining NSR on exam -continue Atenolol 25mg  BID, Flecainide 100mg  BID and Apixaban 2.5mg  BID (dosed for age > 80 and Creatinine which is usually > 1.5)  3.  HTN -BP controlled -continue Atenolol 25mg  BID and Lisinopril 5mg  daily  4.  Chronic LE edema -exacerbated by dietary indiscretion with Na as well as chronic venous insuff and sitting all day with legs hanging down -very stable on diuretics and thigh high compression hose -continue with compression hose and low dose diuretic  5.  Chronic DOE -2D echo 01/2019 showed normal LVF  with  increased stiffness of heart muscle, severe LAE -lexiscan myoview 12/2018 showed no ischemia -felt to be multifactorial from obesity, sedentary state with deconditioning -her SOB has not changed since last OV  6.  CKD stage 3a -creatinine stable at 1.38 in March -followed by PCP -check BMET   Medication Adjustments/Labs and Tests Ordered: Current medicines are reviewed at length with the patient today.  Concerns regarding medicines are outlined above.  No orders of the defined types were placed in this encounter.  No orders of the defined types were placed in this encounter.   Signed, Fransico Him, MD  10/13/2020 2:06 PM    [place]

## 2020-10-13 NOTE — Addendum Note (Signed)
Addended by: Antonieta Iba on: 10/13/2020 02:17 PM   Modules accepted: Orders

## 2020-10-13 NOTE — Patient Instructions (Addendum)
Medication Instructions:  Your physician recommends that you continue on your current medications as directed. Please refer to the Current Medication list given to you today.  *If you need a refill on your cardiac medications before your next appointment, please call your pharmacy*  Lab Work: TODAY: BMET If you have labs (blood work) drawn today and your tests are completely normal, you will receive your results only by: Marland Kitchen MyChart Message (if you have MyChart) OR . A paper copy in the mail If you have any lab test that is abnormal or we need to change your treatment, we will call you to review the results.  Follow-Up: At [facility], you and your health needs are our priority.  As part of our continuing mission to provide you with exceptional heart care, we have created designated Provider Care Teams.  These Care Teams include your primary Cardiologist (physician) and Advanced Practice Providers (APPs -  Physician Assistants and Nurse Practitioners) who all work together to provide you with the care you need, when you need it.  We recommend signing up for the patient portal called "MyChart".  Sign up information is provided on this After Visit Summary.  MyChart is used to connect with patients for Virtual Visits (Telemedicine).  Patients are able to view lab/test results, encounter notes, upcoming appointments, etc.  Non-urgent messages can be sent to your provider as well.   To learn more about what you can do with MyChart, go to [email or web address].    Your next appointment:   6 month(s)  The format for your next appointment:   In Person  Provider:   You may see Fransico Him, MD or one of the following Advanced Practice Providers on your designated Care Team:    Melina Copa, PA-C  Ermalinda Barrios, PA-C

## 2020-10-14 ENCOUNTER — Telehealth: Payer: Self-pay

## 2020-10-14 DIAGNOSIS — Z9181 History of falling: Secondary | ICD-10-CM | POA: Diagnosis not present

## 2020-10-14 DIAGNOSIS — R278 Other lack of coordination: Secondary | ICD-10-CM | POA: Diagnosis not present

## 2020-10-14 DIAGNOSIS — M6281 Muscle weakness (generalized): Secondary | ICD-10-CM | POA: Diagnosis not present

## 2020-10-14 DIAGNOSIS — I10 Essential (primary) hypertension: Secondary | ICD-10-CM

## 2020-10-14 DIAGNOSIS — R2689 Other abnormalities of gait and mobility: Secondary | ICD-10-CM | POA: Diagnosis not present

## 2020-10-14 DIAGNOSIS — R2681 Unsteadiness on feet: Secondary | ICD-10-CM | POA: Diagnosis not present

## 2020-10-14 LAB — BASIC METABOLIC PANEL
BUN/Creatinine Ratio: 20 (ref 12–28)
BUN: 35 mg/dL — ABNORMAL HIGH (ref 8–27)
CO2: 29 mmol/L (ref 20–29)
Calcium: 9 mg/dL (ref 8.7–10.3)
Chloride: 103 mmol/L (ref 96–106)
Creatinine, Ser: 1.73 mg/dL — ABNORMAL HIGH (ref 0.57–1.00)
GFR calc Af Amer: 31 mL/min/{1.73_m2} — ABNORMAL LOW (ref >59)
GFR calc non Af Amer: 27 mL/min/{1.73_m2} — ABNORMAL LOW (ref >59)
Glucose: 130 mg/dL — ABNORMAL HIGH (ref 65–99)
Potassium: 5.1 mmol/L (ref 3.5–5.2)
Sodium: 142 mmol/L (ref 134–144)

## 2020-10-14 MED ORDER — POTASSIUM CHLORIDE CRYS ER 20 MEQ PO TBCR: 20.0000 meq | EXTENDED_RELEASE_TABLET | Freq: Two times a day (BID) | ORAL | 3 refills | Status: DC

## 2020-10-14 MED ORDER — TORSEMIDE 10 MG PO TABS: 10.0000 mg | ORAL_TABLET | ORAL | 3 refills | Status: DC

## 2020-10-14 NOTE — Telephone Encounter (Signed)
Sueanne Margarita, MD  10/14/2020 8:50 AM EDT     Renal function bumped - please change Torsemide to 10mg  qod and repeat BMET in 1 week. Call if legs start to swell above baseline   Patient's daughter has been informed. They need prescriptions and lab orders faxed over to the patient's facility. Fax number is [phone].

## 2020-10-14 NOTE — Telephone Encounter (Signed)
-----   Message from Sueanne Margarita, MD sent at 10/14/2020  8:50 AM EDT ----- Also change Kdur to 38meq BID only on days she takes Torsemide

## 2020-10-15 DIAGNOSIS — R2689 Other abnormalities of gait and mobility: Secondary | ICD-10-CM | POA: Diagnosis not present

## 2020-10-15 DIAGNOSIS — R2681 Unsteadiness on feet: Secondary | ICD-10-CM | POA: Diagnosis not present

## 2020-10-15 DIAGNOSIS — R278 Other lack of coordination: Secondary | ICD-10-CM | POA: Diagnosis not present

## 2020-10-15 DIAGNOSIS — Z9181 History of falling: Secondary | ICD-10-CM | POA: Diagnosis not present

## 2020-10-15 DIAGNOSIS — C541 Malignant neoplasm of endometrium: Secondary | ICD-10-CM | POA: Diagnosis not present

## 2020-10-15 DIAGNOSIS — M6281 Muscle weakness (generalized): Secondary | ICD-10-CM | POA: Diagnosis not present

## 2020-10-15 NOTE — Telephone Encounter (Signed)
Orders signed by Dr. Radford Pax and faxed to facility.

## 2020-10-16 ENCOUNTER — Telehealth: Payer: Self-pay

## 2020-10-16 DIAGNOSIS — Z1159 Encounter for screening for other viral diseases: Secondary | ICD-10-CM | POA: Diagnosis not present

## 2020-10-16 DIAGNOSIS — Z20828 Contact with and (suspected) exposure to other viral communicable diseases: Secondary | ICD-10-CM | POA: Diagnosis not present

## 2020-10-16 NOTE — Telephone Encounter (Signed)
Spoke with the patients daughter that states the lab orders were not received at the patients facility yesterday.   Sueanne Margarita, MD  10/14/2020 8:50 AM EDT     Renal function bumped - please change Torsemide to 10mg  qod and repeat BMET in 1 week. Call if legs start to swell above baseline   Patient's daughter has been informed. They need prescriptions and lab orders faxed over to the patient's facility. Fax number is [phone].    Sent over lab orders-BMET repeat on 10/22/20-signed by Dr. Radford Pax and faxed to number provided [phone]) at 9:36 am.

## 2020-10-20 DIAGNOSIS — R278 Other lack of coordination: Secondary | ICD-10-CM | POA: Diagnosis not present

## 2020-10-20 DIAGNOSIS — R2681 Unsteadiness on feet: Secondary | ICD-10-CM | POA: Diagnosis not present

## 2020-10-20 DIAGNOSIS — Z1159 Encounter for screening for other viral diseases: Secondary | ICD-10-CM | POA: Diagnosis not present

## 2020-10-20 DIAGNOSIS — Z20828 Contact with and (suspected) exposure to other viral communicable diseases: Secondary | ICD-10-CM | POA: Diagnosis not present

## 2020-10-20 DIAGNOSIS — R2689 Other abnormalities of gait and mobility: Secondary | ICD-10-CM | POA: Diagnosis not present

## 2020-10-20 DIAGNOSIS — Z9181 History of falling: Secondary | ICD-10-CM | POA: Diagnosis not present

## 2020-10-20 DIAGNOSIS — M6281 Muscle weakness (generalized): Secondary | ICD-10-CM | POA: Diagnosis not present

## 2020-10-20 DIAGNOSIS — I10 Essential (primary) hypertension: Secondary | ICD-10-CM | POA: Diagnosis not present

## 2020-10-21 DIAGNOSIS — M6281 Muscle weakness (generalized): Secondary | ICD-10-CM | POA: Diagnosis not present

## 2020-10-21 DIAGNOSIS — R278 Other lack of coordination: Secondary | ICD-10-CM | POA: Diagnosis not present

## 2020-10-21 DIAGNOSIS — R2681 Unsteadiness on feet: Secondary | ICD-10-CM | POA: Diagnosis not present

## 2020-10-21 DIAGNOSIS — N1832 Chronic kidney disease, stage 3b: Secondary | ICD-10-CM | POA: Diagnosis not present

## 2020-10-21 DIAGNOSIS — R2689 Other abnormalities of gait and mobility: Secondary | ICD-10-CM | POA: Diagnosis not present

## 2020-10-21 DIAGNOSIS — I129 Hypertensive chronic kidney disease with stage 1 through stage 4 chronic kidney disease, or unspecified chronic kidney disease: Secondary | ICD-10-CM | POA: Diagnosis not present

## 2020-10-21 DIAGNOSIS — S335XXA Sprain of ligaments of lumbar spine, initial encounter: Secondary | ICD-10-CM | POA: Diagnosis not present

## 2020-10-21 DIAGNOSIS — Z9181 History of falling: Secondary | ICD-10-CM | POA: Diagnosis not present

## 2020-10-22 DIAGNOSIS — R278 Other lack of coordination: Secondary | ICD-10-CM | POA: Diagnosis not present

## 2020-10-22 DIAGNOSIS — R2689 Other abnormalities of gait and mobility: Secondary | ICD-10-CM | POA: Diagnosis not present

## 2020-10-22 DIAGNOSIS — M6281 Muscle weakness (generalized): Secondary | ICD-10-CM | POA: Diagnosis not present

## 2020-10-22 DIAGNOSIS — R2681 Unsteadiness on feet: Secondary | ICD-10-CM | POA: Diagnosis not present

## 2020-10-22 DIAGNOSIS — Z9181 History of falling: Secondary | ICD-10-CM | POA: Diagnosis not present

## 2020-10-23 DIAGNOSIS — Z1159 Encounter for screening for other viral diseases: Secondary | ICD-10-CM | POA: Diagnosis not present

## 2020-10-23 DIAGNOSIS — M6281 Muscle weakness (generalized): Secondary | ICD-10-CM | POA: Diagnosis not present

## 2020-10-23 DIAGNOSIS — R278 Other lack of coordination: Secondary | ICD-10-CM | POA: Diagnosis not present

## 2020-10-23 DIAGNOSIS — Z23 Encounter for immunization: Secondary | ICD-10-CM | POA: Diagnosis not present

## 2020-10-23 DIAGNOSIS — Z20828 Contact with and (suspected) exposure to other viral communicable diseases: Secondary | ICD-10-CM | POA: Diagnosis not present

## 2020-10-23 DIAGNOSIS — R2689 Other abnormalities of gait and mobility: Secondary | ICD-10-CM | POA: Diagnosis not present

## 2020-10-23 DIAGNOSIS — Z9181 History of falling: Secondary | ICD-10-CM | POA: Diagnosis not present

## 2020-10-23 DIAGNOSIS — R2681 Unsteadiness on feet: Secondary | ICD-10-CM | POA: Diagnosis not present

## 2020-10-26 DIAGNOSIS — R278 Other lack of coordination: Secondary | ICD-10-CM | POA: Diagnosis not present

## 2020-10-26 DIAGNOSIS — M6281 Muscle weakness (generalized): Secondary | ICD-10-CM | POA: Diagnosis not present

## 2020-10-26 DIAGNOSIS — Z9181 History of falling: Secondary | ICD-10-CM | POA: Diagnosis not present

## 2020-10-26 DIAGNOSIS — R2681 Unsteadiness on feet: Secondary | ICD-10-CM | POA: Diagnosis not present

## 2020-10-26 DIAGNOSIS — R2689 Other abnormalities of gait and mobility: Secondary | ICD-10-CM | POA: Diagnosis not present

## 2020-10-27 DIAGNOSIS — R278 Other lack of coordination: Secondary | ICD-10-CM | POA: Diagnosis not present

## 2020-10-27 DIAGNOSIS — R2681 Unsteadiness on feet: Secondary | ICD-10-CM | POA: Diagnosis not present

## 2020-10-27 DIAGNOSIS — M6281 Muscle weakness (generalized): Secondary | ICD-10-CM | POA: Diagnosis not present

## 2020-10-27 DIAGNOSIS — Z1159 Encounter for screening for other viral diseases: Secondary | ICD-10-CM | POA: Diagnosis not present

## 2020-10-27 DIAGNOSIS — R2689 Other abnormalities of gait and mobility: Secondary | ICD-10-CM | POA: Diagnosis not present

## 2020-10-27 DIAGNOSIS — Z20828 Contact with and (suspected) exposure to other viral communicable diseases: Secondary | ICD-10-CM | POA: Diagnosis not present

## 2020-10-27 DIAGNOSIS — Z9181 History of falling: Secondary | ICD-10-CM | POA: Diagnosis not present

## 2020-10-28 DIAGNOSIS — R2681 Unsteadiness on feet: Secondary | ICD-10-CM | POA: Diagnosis not present

## 2020-10-28 DIAGNOSIS — Z9181 History of falling: Secondary | ICD-10-CM | POA: Diagnosis not present

## 2020-10-28 DIAGNOSIS — R2689 Other abnormalities of gait and mobility: Secondary | ICD-10-CM | POA: Diagnosis not present

## 2020-10-28 DIAGNOSIS — R278 Other lack of coordination: Secondary | ICD-10-CM | POA: Diagnosis not present

## 2020-10-28 DIAGNOSIS — M6281 Muscle weakness (generalized): Secondary | ICD-10-CM | POA: Diagnosis not present

## 2020-10-29 DIAGNOSIS — R2681 Unsteadiness on feet: Secondary | ICD-10-CM | POA: Diagnosis not present

## 2020-10-29 DIAGNOSIS — Z9181 History of falling: Secondary | ICD-10-CM | POA: Diagnosis not present

## 2020-10-29 DIAGNOSIS — R278 Other lack of coordination: Secondary | ICD-10-CM | POA: Diagnosis not present

## 2020-10-29 DIAGNOSIS — R2689 Other abnormalities of gait and mobility: Secondary | ICD-10-CM | POA: Diagnosis not present

## 2020-10-29 DIAGNOSIS — M6281 Muscle weakness (generalized): Secondary | ICD-10-CM | POA: Diagnosis not present

## 2020-10-30 DIAGNOSIS — Z9181 History of falling: Secondary | ICD-10-CM | POA: Diagnosis not present

## 2020-10-30 DIAGNOSIS — R2689 Other abnormalities of gait and mobility: Secondary | ICD-10-CM | POA: Diagnosis not present

## 2020-10-30 DIAGNOSIS — I890 Lymphedema, not elsewhere classified: Secondary | ICD-10-CM | POA: Diagnosis not present

## 2020-10-30 DIAGNOSIS — S81811D Laceration without foreign body, right lower leg, subsequent encounter: Secondary | ICD-10-CM | POA: Diagnosis not present

## 2020-10-30 DIAGNOSIS — R278 Other lack of coordination: Secondary | ICD-10-CM | POA: Diagnosis not present

## 2020-10-30 DIAGNOSIS — Z1159 Encounter for screening for other viral diseases: Secondary | ICD-10-CM | POA: Diagnosis not present

## 2020-10-30 DIAGNOSIS — I130 Hypertensive heart and chronic kidney disease with heart failure and stage 1 through stage 4 chronic kidney disease, or unspecified chronic kidney disease: Secondary | ICD-10-CM | POA: Diagnosis not present

## 2020-10-30 DIAGNOSIS — M6281 Muscle weakness (generalized): Secondary | ICD-10-CM | POA: Diagnosis not present

## 2020-10-30 DIAGNOSIS — R2681 Unsteadiness on feet: Secondary | ICD-10-CM | POA: Diagnosis not present

## 2020-10-30 DIAGNOSIS — Z20828 Contact with and (suspected) exposure to other viral communicable diseases: Secondary | ICD-10-CM | POA: Diagnosis not present

## 2020-11-02 DIAGNOSIS — Z9181 History of falling: Secondary | ICD-10-CM | POA: Diagnosis not present

## 2020-11-02 DIAGNOSIS — R2689 Other abnormalities of gait and mobility: Secondary | ICD-10-CM | POA: Diagnosis not present

## 2020-11-02 DIAGNOSIS — R278 Other lack of coordination: Secondary | ICD-10-CM | POA: Diagnosis not present

## 2020-11-02 DIAGNOSIS — M6281 Muscle weakness (generalized): Secondary | ICD-10-CM | POA: Diagnosis not present

## 2020-11-02 DIAGNOSIS — R2681 Unsteadiness on feet: Secondary | ICD-10-CM | POA: Diagnosis not present

## 2020-11-03 DIAGNOSIS — Z20828 Contact with and (suspected) exposure to other viral communicable diseases: Secondary | ICD-10-CM | POA: Diagnosis not present

## 2020-11-03 DIAGNOSIS — Z1159 Encounter for screening for other viral diseases: Secondary | ICD-10-CM | POA: Diagnosis not present

## 2020-11-03 DIAGNOSIS — R2689 Other abnormalities of gait and mobility: Secondary | ICD-10-CM | POA: Diagnosis not present

## 2020-11-03 DIAGNOSIS — M6281 Muscle weakness (generalized): Secondary | ICD-10-CM | POA: Diagnosis not present

## 2020-11-03 DIAGNOSIS — R278 Other lack of coordination: Secondary | ICD-10-CM | POA: Diagnosis not present

## 2020-11-03 DIAGNOSIS — N189 Chronic kidney disease, unspecified: Secondary | ICD-10-CM | POA: Diagnosis not present

## 2020-11-03 DIAGNOSIS — B351 Tinea unguium: Secondary | ICD-10-CM | POA: Diagnosis not present

## 2020-11-03 DIAGNOSIS — M2041 Other hammer toe(s) (acquired), right foot: Secondary | ICD-10-CM | POA: Diagnosis not present

## 2020-11-03 DIAGNOSIS — R2681 Unsteadiness on feet: Secondary | ICD-10-CM | POA: Diagnosis not present

## 2020-11-03 DIAGNOSIS — Z9181 History of falling: Secondary | ICD-10-CM | POA: Diagnosis not present

## 2020-11-03 DIAGNOSIS — M79675 Pain in left toe(s): Secondary | ICD-10-CM | POA: Diagnosis not present

## 2020-11-06 DIAGNOSIS — M6281 Muscle weakness (generalized): Secondary | ICD-10-CM | POA: Diagnosis not present

## 2020-11-06 DIAGNOSIS — R278 Other lack of coordination: Secondary | ICD-10-CM | POA: Diagnosis not present

## 2020-11-06 DIAGNOSIS — Z9181 History of falling: Secondary | ICD-10-CM | POA: Diagnosis not present

## 2020-11-06 DIAGNOSIS — R2681 Unsteadiness on feet: Secondary | ICD-10-CM | POA: Diagnosis not present

## 2020-11-06 DIAGNOSIS — R2689 Other abnormalities of gait and mobility: Secondary | ICD-10-CM | POA: Diagnosis not present

## 2020-11-09 DIAGNOSIS — M6281 Muscle weakness (generalized): Secondary | ICD-10-CM | POA: Diagnosis not present

## 2020-11-09 DIAGNOSIS — R2689 Other abnormalities of gait and mobility: Secondary | ICD-10-CM | POA: Diagnosis not present

## 2020-11-09 DIAGNOSIS — R2681 Unsteadiness on feet: Secondary | ICD-10-CM | POA: Diagnosis not present

## 2020-11-09 DIAGNOSIS — Z9181 History of falling: Secondary | ICD-10-CM | POA: Diagnosis not present

## 2020-11-09 DIAGNOSIS — R278 Other lack of coordination: Secondary | ICD-10-CM | POA: Diagnosis not present

## 2020-11-10 DIAGNOSIS — R278 Other lack of coordination: Secondary | ICD-10-CM | POA: Diagnosis not present

## 2020-11-10 DIAGNOSIS — Z20828 Contact with and (suspected) exposure to other viral communicable diseases: Secondary | ICD-10-CM | POA: Diagnosis not present

## 2020-11-10 DIAGNOSIS — M6281 Muscle weakness (generalized): Secondary | ICD-10-CM | POA: Diagnosis not present

## 2020-11-10 DIAGNOSIS — R2689 Other abnormalities of gait and mobility: Secondary | ICD-10-CM | POA: Diagnosis not present

## 2020-11-10 DIAGNOSIS — Z1159 Encounter for screening for other viral diseases: Secondary | ICD-10-CM | POA: Diagnosis not present

## 2020-11-10 DIAGNOSIS — R2681 Unsteadiness on feet: Secondary | ICD-10-CM | POA: Diagnosis not present

## 2020-11-10 DIAGNOSIS — Z9181 History of falling: Secondary | ICD-10-CM | POA: Diagnosis not present

## 2020-11-11 DIAGNOSIS — R2689 Other abnormalities of gait and mobility: Secondary | ICD-10-CM | POA: Diagnosis not present

## 2020-11-11 DIAGNOSIS — Z9181 History of falling: Secondary | ICD-10-CM | POA: Diagnosis not present

## 2020-11-11 DIAGNOSIS — M6281 Muscle weakness (generalized): Secondary | ICD-10-CM | POA: Diagnosis not present

## 2020-11-11 DIAGNOSIS — R2681 Unsteadiness on feet: Secondary | ICD-10-CM | POA: Diagnosis not present

## 2020-11-13 DIAGNOSIS — M6281 Muscle weakness (generalized): Secondary | ICD-10-CM | POA: Diagnosis not present

## 2020-11-13 DIAGNOSIS — Z1159 Encounter for screening for other viral diseases: Secondary | ICD-10-CM | POA: Diagnosis not present

## 2020-11-13 DIAGNOSIS — Z20828 Contact with and (suspected) exposure to other viral communicable diseases: Secondary | ICD-10-CM | POA: Diagnosis not present

## 2020-11-13 DIAGNOSIS — R2689 Other abnormalities of gait and mobility: Secondary | ICD-10-CM | POA: Diagnosis not present

## 2020-11-13 DIAGNOSIS — Z9181 History of falling: Secondary | ICD-10-CM | POA: Diagnosis not present

## 2020-11-13 DIAGNOSIS — R2681 Unsteadiness on feet: Secondary | ICD-10-CM | POA: Diagnosis not present

## 2020-11-16 DIAGNOSIS — R2689 Other abnormalities of gait and mobility: Secondary | ICD-10-CM | POA: Diagnosis not present

## 2020-11-16 DIAGNOSIS — Z9181 History of falling: Secondary | ICD-10-CM | POA: Diagnosis not present

## 2020-11-16 DIAGNOSIS — R2681 Unsteadiness on feet: Secondary | ICD-10-CM | POA: Diagnosis not present

## 2020-11-16 DIAGNOSIS — M6281 Muscle weakness (generalized): Secondary | ICD-10-CM | POA: Diagnosis not present

## 2020-11-17 DIAGNOSIS — Z20828 Contact with and (suspected) exposure to other viral communicable diseases: Secondary | ICD-10-CM | POA: Diagnosis not present

## 2020-11-17 DIAGNOSIS — Z1159 Encounter for screening for other viral diseases: Secondary | ICD-10-CM | POA: Diagnosis not present

## 2020-11-18 DIAGNOSIS — I4891 Unspecified atrial fibrillation: Secondary | ICD-10-CM | POA: Diagnosis not present

## 2020-11-18 DIAGNOSIS — I509 Heart failure, unspecified: Secondary | ICD-10-CM | POA: Diagnosis not present

## 2020-11-18 DIAGNOSIS — I129 Hypertensive chronic kidney disease with stage 1 through stage 4 chronic kidney disease, or unspecified chronic kidney disease: Secondary | ICD-10-CM | POA: Diagnosis not present

## 2020-11-19 DIAGNOSIS — M6281 Muscle weakness (generalized): Secondary | ICD-10-CM | POA: Diagnosis not present

## 2020-11-19 DIAGNOSIS — R2681 Unsteadiness on feet: Secondary | ICD-10-CM | POA: Diagnosis not present

## 2020-11-19 DIAGNOSIS — R2689 Other abnormalities of gait and mobility: Secondary | ICD-10-CM | POA: Diagnosis not present

## 2020-11-19 DIAGNOSIS — Z9181 History of falling: Secondary | ICD-10-CM | POA: Diagnosis not present

## 2020-11-20 DIAGNOSIS — Z20828 Contact with and (suspected) exposure to other viral communicable diseases: Secondary | ICD-10-CM | POA: Diagnosis not present

## 2020-11-20 DIAGNOSIS — Z1159 Encounter for screening for other viral diseases: Secondary | ICD-10-CM | POA: Diagnosis not present

## 2020-11-24 DIAGNOSIS — R2681 Unsteadiness on feet: Secondary | ICD-10-CM | POA: Diagnosis not present

## 2020-11-24 DIAGNOSIS — Z9181 History of falling: Secondary | ICD-10-CM | POA: Diagnosis not present

## 2020-11-24 DIAGNOSIS — R2689 Other abnormalities of gait and mobility: Secondary | ICD-10-CM | POA: Diagnosis not present

## 2020-11-24 DIAGNOSIS — Z1159 Encounter for screening for other viral diseases: Secondary | ICD-10-CM | POA: Diagnosis not present

## 2020-11-24 DIAGNOSIS — Z20828 Contact with and (suspected) exposure to other viral communicable diseases: Secondary | ICD-10-CM | POA: Diagnosis not present

## 2020-11-24 DIAGNOSIS — M6281 Muscle weakness (generalized): Secondary | ICD-10-CM | POA: Diagnosis not present

## 2020-11-26 DIAGNOSIS — R2689 Other abnormalities of gait and mobility: Secondary | ICD-10-CM | POA: Diagnosis not present

## 2020-11-26 DIAGNOSIS — R2681 Unsteadiness on feet: Secondary | ICD-10-CM | POA: Diagnosis not present

## 2020-11-26 DIAGNOSIS — M6281 Muscle weakness (generalized): Secondary | ICD-10-CM | POA: Diagnosis not present

## 2020-11-26 DIAGNOSIS — Z9181 History of falling: Secondary | ICD-10-CM | POA: Diagnosis not present

## 2020-11-27 DIAGNOSIS — Z1159 Encounter for screening for other viral diseases: Secondary | ICD-10-CM | POA: Diagnosis not present

## 2020-11-27 DIAGNOSIS — Z20828 Contact with and (suspected) exposure to other viral communicable diseases: Secondary | ICD-10-CM | POA: Diagnosis not present

## 2020-11-28 DIAGNOSIS — Z9181 History of falling: Secondary | ICD-10-CM | POA: Diagnosis not present

## 2020-11-28 DIAGNOSIS — M6281 Muscle weakness (generalized): Secondary | ICD-10-CM | POA: Diagnosis not present

## 2020-11-28 DIAGNOSIS — R2681 Unsteadiness on feet: Secondary | ICD-10-CM | POA: Diagnosis not present

## 2020-11-28 DIAGNOSIS — R2689 Other abnormalities of gait and mobility: Secondary | ICD-10-CM | POA: Diagnosis not present

## 2020-12-01 DIAGNOSIS — R2681 Unsteadiness on feet: Secondary | ICD-10-CM | POA: Diagnosis not present

## 2020-12-01 DIAGNOSIS — Z9181 History of falling: Secondary | ICD-10-CM | POA: Diagnosis not present

## 2020-12-01 DIAGNOSIS — Z1159 Encounter for screening for other viral diseases: Secondary | ICD-10-CM | POA: Diagnosis not present

## 2020-12-01 DIAGNOSIS — R2689 Other abnormalities of gait and mobility: Secondary | ICD-10-CM | POA: Diagnosis not present

## 2020-12-01 DIAGNOSIS — Z20828 Contact with and (suspected) exposure to other viral communicable diseases: Secondary | ICD-10-CM | POA: Diagnosis not present

## 2020-12-01 DIAGNOSIS — M6281 Muscle weakness (generalized): Secondary | ICD-10-CM | POA: Diagnosis not present

## 2020-12-03 DIAGNOSIS — Z1159 Encounter for screening for other viral diseases: Secondary | ICD-10-CM | POA: Diagnosis not present

## 2020-12-03 DIAGNOSIS — Z20828 Contact with and (suspected) exposure to other viral communicable diseases: Secondary | ICD-10-CM | POA: Diagnosis not present

## 2020-12-08 DIAGNOSIS — Z1159 Encounter for screening for other viral diseases: Secondary | ICD-10-CM | POA: Diagnosis not present

## 2020-12-08 DIAGNOSIS — Z20828 Contact with and (suspected) exposure to other viral communicable diseases: Secondary | ICD-10-CM | POA: Diagnosis not present

## 2020-12-11 DIAGNOSIS — Z20828 Contact with and (suspected) exposure to other viral communicable diseases: Secondary | ICD-10-CM | POA: Diagnosis not present

## 2020-12-11 DIAGNOSIS — Z1159 Encounter for screening for other viral diseases: Secondary | ICD-10-CM | POA: Diagnosis not present

## 2021-03-16 ENCOUNTER — Telehealth: Payer: Self-pay | Admitting: Cardiology

## 2021-03-16 NOTE — Telephone Encounter (Signed)
Spoke with the patient's daughter who states that the provider at the assisted living facility has been adjusting the patient's medications. She knows that she is now taking atenolol 100 mg daily. She states that there was another medication adjustment but is not sure what it was. Recent blood pressure readings are reported below. The daughter is not currently with the patient but states that the patient told her she was feeling fine without any symptoms.   I called the med tech who reports that the patient is on atenolol 100 mg daily and lisinopril 10 mg daily. She states the atenolol was started on 03/31. She states blood pressure readings listed are prior to giving her medications.  Today the tech rechecked her BP about 2 hours after giving medications and blood pressure was 166/70. The tech is going to recheck again later today. She will keep up with readings and fax them over to [place].   The patient is scheduled for an appointment with Dr. Radford Pax on 4/7. Patient's daughter will bring list of patient's medications.

## 2021-03-16 NOTE — Telephone Encounter (Signed)
I think we should only be having one provider managing BP meds so would recommend that the facility provider manage HTN

## 2021-03-16 NOTE — Telephone Encounter (Signed)
Pt c/o BP issue: STAT if pt c/o blurred vision, one-sided weakness or slurred speech  1. What are your last 5 BP readings?  03/16/21 198/77 03/15/21 192/82 03/14/21 193/76 03/13/21 182/75 03/12/21 171/88 03/11/21 189/83   2. Are you having any other symptoms (ex. Dizziness, headache, blurred vision, passed out)? No (confirmed with pt and living facility this morning)  3. What is your BP issue? Doctor at living facility has been adjusting BP medication.   Pt c/o medication issue: Hypertension   1. Name of Medication: Atenolol 100 MG's   2. How are you currently taking this medication (dosage and times per day)? Started 03/11/21 taking 1 tablet by mouth daily   3. Are you having a reaction (difficulty breathing--STAT)? No   4. What is your medication issue? Doctor at living facility has been adjusting BP medication. If you would like to speak with living facility their med techs number is [phone]. Pt's daughter is wanting to schedule an appt for her to be seen in regards to this. Please advise.

## 2021-03-17 NOTE — Telephone Encounter (Signed)
Spoke with the patient's daughter who states that the patient is doing well today. Her blood pressure this morning was 180/?Marland Kitchen The provider through the facility ordered a PRN medication, she is unsure what it was. On recheck of her blood pressure later this morning it was 115/55.  Discussed the importance of having only one provider manage the patient's hypertension. The daughter verbalized understanding and since the facility provider is accessible to her and receives updated BP readings regularly they will continue to manage her blood pressure. Patient will continue to see Dr. Radford Pax for other cardiac issues. Patient currently is not having any complaints of chest pain or shortness of breath. She does have swelling that is controlled with use of compression hose and torsemide.  Appointment with Dr. Radford Pax has been cancelled for tomorrow. I have scheduled the patient an appointment for her usual 6 month FU appointment in May.

## 2021-03-18 ENCOUNTER — Ambulatory Visit: Payer: Medicare Other | Admitting: Cardiology

## 2021-04-26 ENCOUNTER — Encounter: Payer: Self-pay | Admitting: Cardiology

## 2021-04-26 ENCOUNTER — Other Ambulatory Visit: Payer: Self-pay

## 2021-04-26 ENCOUNTER — Ambulatory Visit (INDEPENDENT_AMBULATORY_CARE_PROVIDER_SITE_OTHER): Payer: Medicare Other | Admitting: Cardiology

## 2021-04-26 VITALS — BP 108/60 | HR 60 | Ht 64.5 in | Wt 167.6 lb

## 2021-04-26 DIAGNOSIS — I5032 Chronic diastolic (congestive) heart failure: Secondary | ICD-10-CM | POA: Diagnosis not present

## 2021-04-26 DIAGNOSIS — R600 Localized edema: Secondary | ICD-10-CM | POA: Diagnosis not present

## 2021-04-26 DIAGNOSIS — I480 Paroxysmal atrial fibrillation: Secondary | ICD-10-CM | POA: Diagnosis not present

## 2021-04-26 DIAGNOSIS — R0602 Shortness of breath: Secondary | ICD-10-CM

## 2021-04-26 DIAGNOSIS — N1831 Chronic kidney disease, stage 3a: Secondary | ICD-10-CM

## 2021-04-26 DIAGNOSIS — I10 Essential (primary) hypertension: Secondary | ICD-10-CM | POA: Diagnosis not present

## 2021-04-26 LAB — HEMOGLOBIN: Hemoglobin: 9.4 g/dL — ABNORMAL LOW (ref 11.1–15.9)

## 2021-04-26 MED ORDER — ATENOLOL 100 MG PO TABS: 100.0000 mg | ORAL_TABLET | Freq: Every day | ORAL | 3 refills | Status: DC

## 2021-04-26 MED ORDER — FLECAINIDE ACETATE 100 MG PO TABS: 100.0000 mg | ORAL_TABLET | Freq: Two times a day (BID) | ORAL | 3 refills | Status: DC

## 2021-04-26 MED ORDER — APIXABAN 2.5 MG PO TABS: 2.5000 mg | ORAL_TABLET | Freq: Two times a day (BID) | ORAL | 3 refills | Status: DC

## 2021-04-26 MED ORDER — ATORVASTATIN CALCIUM 10 MG PO TABS: 10.0000 mg | ORAL_TABLET | Freq: Every day | ORAL | 3 refills | Status: DC

## 2021-04-26 NOTE — Addendum Note (Signed)
Addended by: Antonieta Iba on: 04/26/2021 11:53 AM   Modules accepted: Orders

## 2021-04-26 NOTE — Progress Notes (Signed)
Cardiology Office Note:    Date:  04/26/2021   ID:  Christina Morgan, DOB Feb 13, 1933, MRN [id number]  PCP:  Orvis Brill, [facility]  Cardiologist:  Fransico Him, MD    Referring MD: Orvis Brill, [facility]*   Chief Complaint  Patient presents with  . Congestive Heart Failure  . Hypertension  . Atrial Fibrillation  . Hyperlipidemia  . Shortness of Breath    History of Present Illness:    Christina Morgan is a 87 y.o. female with a hx of  paroxysmal atrial fibrillation on Eliquis and flecainide, hypertension, hyperlipidemia, and chronic lower extremity edema. Shealso has a history of chronic diastolic CHF withEchoshowingLVEF 55-60% with elevated ventricular filling pressures and atrial filling pressures, trivial pericardial effusionand moderate aortic insufficiency.She also haschronic lower extremity edema with chronic venous insufficiency exacerbated by higher doses of amlodipine and sitting all day.She has chronic DOE that is felt to be due to sedentary state and deconditioned.   She is here today for followup of PAF, LE edema, CHF, HLD, HTN and is doing well.  She denies any chest pain or pressure, SOB, DOE, PND, orthopnea, dizziness, palpitations or syncope. She has chronic DOE and LE edema which have been very stable.  She wears compression hose during the day. She is compliant with her meds and is tolerating meds with no SE.    Past Medical History:  Diagnosis Date  . Cancer [facility])    endometrial ca  . Chronic diastolic CHF (congestive heart failure) ([place])   . Chronic edema   . CKD (chronic kidney disease), stage III ([place])   . Dyslipidemia   . Fracture of left humerus   . History of cardiovascular stress test    Lexiscan Myoview (06/2014): No ischemia or scar, not gated, low risk  . Hx of echocardiogram    Echo (05/02/14): EF 60% to 65%. No regional wall motion abnormalities. Mild AI, mildly dilated aortic root (37 mm), trivial MR, trivial TR  . Hyperlipidemia    . Hypertension   . Lipoma of skin    multiple  . Osteopenia   . PAF (paroxysmal atrial fibrillation) ([place])    failed DCCV 05/2014 >> Flecainide started >> DCCV 7/15 sucessful;  f/u ETT neg for pro-arrhythmia >> recurrent AF/AFL >> Flecainide inc to 100 bid with repeat DCCV 9/15  . Vitamin D deficiency 09/25/2019    Past Surgical History:  Procedure Laterality Date  . CARDIOVERSION N/A 06/05/2014   Procedure: CARDIOVERSION;  Surgeon: Sueanne Margarita, MD;  Location: [place];  Service: Cardiovascular;  Laterality: N/A;  . CARDIOVERSION N/A 07/11/2014   Procedure: CARDIOVERSION;  Surgeon: Sueanne Margarita, MD;  Location[facility] ENDOSCOPY;  Service: Cardiovascular;  Laterality: N/A;  . CARDIOVERSION N/A 09/04/2014   Procedure: CARDIOVERSION;  Surgeon: Sueanne Margarita, MD;  Location[facility] ENDOSCOPY;  Service: Cardiovascular;  Laterality: N/A;  . COLONOSCOPY  2008  . COLONOSCOPY N/A 05/20/2015   Procedure: COLONOSCOPY;  Surgeon: Ronald Lobo, MD;  Location: [facility] ENDOSCOPY;  Service: Endoscopy;  Laterality: N/A;  . FEMUR IM NAIL Right 09/24/2019   Procedure: INTRAMEDULLARY (IM) NAIL FEMORAL;  Surgeon: Altamese Nitro, MD;  Location: [place];  Service: Orthopedics;  Laterality: Right;  . IR KYPHO EA ADDL LEVEL THORACIC OR LUMBAR  04/12/2019  . IR KYPHO THORACIC WITH BONE BIOPSY  04/12/2019  . ROBOTIC ASSISTED SUPRACERVICAL HYSTERECTOMY WITH BILATERAL SALPINGO OOPHERECTOMY  11/06/2012   and bilateral pelvic lymph node dissection    Current Medications: Current Meds  Medication Sig  .  acetaminophen (TYLENOL) 325 MG tablet Take 1-2 tablets (325-650 mg total) by mouth every 6 (six) hours as needed for mild pain (pain score 1-3 or temp > 100.5).  Marland Kitchen albuterol (VENTOLIN HFA) 108 (90 Base) MCG/ACT inhaler Inhale 2 puffs into the lungs every 4 (four) hours as needed for wheezing or shortness of breath (please try to use ALbuterol inhaler PRIOR to [place]).  Marland Kitchen apixaban (ELIQUIS) 2.5 MG TABS tablet Take 2.5 mg by mouth 2  (two) times daily.  Marland Kitchen atenolol (TENORMIN) 100 MG tablet Take 100 mg by mouth daily.  Marland Kitchen atorvastatin (LIPITOR) 10 MG tablet Take 1 tablet (10 mg total) by mouth at bedtime.  Marland Kitchen [place] Saline Nasal No-Drip GEL Place 1 application into both nostrils 2 (two) times daily.  . bisacodyl (DULCOLAX) 5 MG EC tablet Take by mouth.  . cloNIDine (CATAPRES) 0.1 MG tablet Take 0.1 mg by mouth 2 (two) times daily.  Marland Kitchen docusate sodium (COLACE) 100 MG capsule Take 1 capsule (100 mg total) by mouth 2 (two) times daily. (Patient taking differently: Take 100 mg by mouth daily as needed for mild constipation.)  . flecainide (TAMBOCOR) 100 MG tablet Take 1 tablet (100 mg total) by mouth 2 (two) times daily.  Marland Kitchen letrozole (FEMARA) 2.5 MG tablet Take 2.5 mg by mouth daily.  Marland Kitchen lisinopril (ZESTRIL) 10 MG tablet Take 10 mg by mouth daily.  . Multiple Vitamin (DAILY VITE PO) Take 1 tablet by mouth as directed.  . NYSTATIN powder Apply 1 g topically See admin instructions. Spread topically to area under bilateral breasts & groin twice a day at 8 AM and 8 PM  . omeprazole (PRILOSEC) 20 MG capsule Take 1 capsule by mouth daily.  . potassium chloride SA (KLOR-CON) 20 MEQ tablet Take 1 tablet (20 mEq total) by mouth 2 (two) times daily. Take on same day as torsemide  . tamoxifen (NOLVADEX) 20 MG tablet Take 20 mg by mouth daily.  . vitamin B-12 (CYANOCOBALAMIN) 250 MCG tablet Take 250 mcg by mouth daily.  . vitamin C (VITAMIN C) 500 MG tablet Take 1 tablet (500 mg total) by mouth daily.  . Vitamin D, Ergocalciferol, (DRISDOL) 1.25 MG (50000 UT) CAPS capsule Take 1 capsule (50,000 Units total) by mouth every 7 (seven) days.     Allergies:   Penicillins   Social History   Socioeconomic History  . Marital status: Widowed    Spouse name: Not on file  . Number of children: Not on file  . Years of education: Not on file  . Highest education level: Not on file  Occupational History  . Not on file  Tobacco Use  . Smoking status:  Never Smoker  . Smokeless tobacco: Never Used  Vaping Use  . Vaping Use: Never used  Substance and Sexual Activity  . Alcohol use: No  . Drug use: No  . Sexual activity: Not on file  Other Topics Concern  . Not on file  Social History Narrative   ** Merged History Encounter **       Social Determinants of Health   Financial Resource Strain: Not on file  Food Insecurity: Not on file  Transportation Needs: Not on file  Physical Activity: Not on file  Stress: Not on file  Social Connections: Not on file     Family History: The patient's family history includes Cancer (age of onset: 5) in her father; Coronary artery disease in her mother; Heart attack in her mother.  ROS:   Please see  the history of present illness.    ROS  All other systems reviewed and negative.   EKGs/Labs/Other Studies Reviewed:    The following studies were reviewed today: nond  EKG:  EKG is ordered today and showed NSR at 60bpm.   Recent Labs: 10/13/2020: BUN 35; Creatinine, Ser 1.73; Potassium 5.1; Sodium 142   Recent Lipid Panel    Component Value Date/Time   CHOL 175 03/21/2016 0915   TRIG 155 (H) 03/21/2016 0915   HDL 63 03/21/2016 0915   CHOLHDL 2.8 03/21/2016 0915   VLDL 31 (H) 03/21/2016 0915   LDLCALC 81 03/21/2016 0915    Physical Exam:    VS:  BP 108/60   Pulse 60   Ht 5' 4.5" (1.638 m)   Wt 167 lb 9.6 oz (76 kg)   BMI 28.32 kg/m     Wt Readings from Last 3 Encounters:  04/26/21 167 lb 9.6 oz (76 kg)  10/13/20 160 lb (72.6 kg)  04/09/20 178 lb 3.2 oz (80.8 kg)     GEN: Well nourished, well developed in no acute distress HEENT: Normal NECK: No JVD; No carotid bruits LYMPHATICS: No lymphadenopathy CARDIAC:RRR, no murmurs, rubs, gallops RESPIRATORY:  Clear to auscultation without rales, wheezing or rhonchi  ABDOMEN: Soft, non-tender, non-distended MUSCULOSKELETAL: trace LLE edema; No deformity  SKIN: Warm and dry NEUROLOGIC:  Alert and oriented x 3 PSYCHIATRIC:   Normal affect    ASSESSMENT:    1. Chronic diastolic congestive heart failure ([place])   2. PAF (paroxysmal atrial fibrillation) ([place])   3. Essential hypertension, benign   4. Lower extremity edema   5. SOB (shortness of breath)   6. Stage 3a chronic kidney disease ([place])    PLAN:    In order of problems listed above:  1.  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  HTN -her BP is well controlled on exam today -continue prescription drug management with Lisinopril 10mg  daily and Atenolol 25mg  BID>>refilled Atenolol for 6 months -outside labs ordered by PCP were reviewed and interpreted by me showing slightly increased SCr of 1.73 and K+ 5.1 -she has been referred to nephrology  4.  Chronic LE edema -exacerbated by dietary indiscretion with Na as well as chronic venous insuff and sitting all day with legs hanging down -very stable on diuretics and thigh high compression hose -I have encouraged her to continue with her compression hose during the day -she will continue on Torsemide 10mg  QOD  5.  Chronic DOE -2D echo 01/2019 showed normal LVF with increased stiffness of heart muscle,  severe LAE -lexiscan myoview 12/2018 showed no ischemia -felt to be multifactorial from obesity, sedentary state with deconditioning -her  SOB is very stable  6.  CKD stage 3a -creatinine has bumped from  1.38 in March now up to 1.73 in NOv -she has been referred to see a nephrologist     Medication Adjustments/Labs and Tests Ordered: Current medicines are reviewed at length with the patient today.  Concerns regarding medicines are outlined above.  Orders Placed This Encounter  Procedures  . EKG 12-Lead   No orders of the defined types were placed in this encounter.   Signed, Fransico Him, MD  04/26/2021 11:38 AM    [facility]

## 2021-04-26 NOTE — Patient Instructions (Signed)
Medication Instructions:  Your physician recommends that you continue on your current medications as directed. Please refer to the Current Medication list given to you today.  *If you need a refill on your cardiac medications before your next appointment, please call your pharmacy*   Lab Work: TODAY: hemoglobin If you have labs (blood work) drawn today and your tests are completely normal, you will receive your results only by: Marland Kitchen MyChart Message (if you have MyChart) OR . A paper copy in the mail If you have any lab test that is abnormal or we need to change your treatment, we will call you to review the results.   Follow-Up: At [facility], you and your health needs are our priority.  As part of our continuing mission to provide you with exceptional heart care, we have created designated Provider Care Teams.  These Care Teams include your primary Cardiologist (physician) and Advanced Practice Providers (APPs -  Physician Assistants and Nurse Practitioners) who all work together to provide you with the care you need, when you need it.  We recommend signing up for the patient portal called "MyChart".  Sign up information is provided on this After Visit Summary.  MyChart is used to connect with patients for Virtual Visits (Telemedicine).  Patients are able to view lab/test results, encounter notes, upcoming appointments, etc.  Non-urgent messages can be sent to your provider as well.   To learn more about what you can do with MyChart, go to [email or web address].    Your next appointment:   1 year(s)  The format for your next appointment:   In Person  Provider:   You may see Fransico Him, MD or one of the following Advanced Practice Providers on your designated Care Team:    Melina Copa, PA-C  Ermalinda Barrios, PA-C

## 2021-06-03 ENCOUNTER — Ambulatory Visit: Payer: BLUE CROSS/BLUE SHIELD | Admitting: Orthopedic Surgery

## 2021-11-29 ENCOUNTER — Encounter (HOSPITAL_COMMUNITY): Payer: Self-pay | Admitting: Cardiology

## 2021-11-29 ENCOUNTER — Observation Stay (HOSPITAL_COMMUNITY)
Admission: EM | Admit: 2021-11-29 | Discharge: 2021-11-30 | Disposition: A | Discharge status: Home / Self Care | Payer: Medicare Other | Attending: Cardiology | Admitting: Cardiology

## 2021-11-29 ENCOUNTER — Other Ambulatory Visit: Payer: Self-pay

## 2021-11-29 ENCOUNTER — Emergency Department (HOSPITAL_COMMUNITY): Payer: Medicare Other

## 2021-11-29 DIAGNOSIS — R001 Bradycardia, unspecified: Secondary | ICD-10-CM | POA: Diagnosis not present

## 2021-11-29 DIAGNOSIS — Z79899 Other long term (current) drug therapy: Secondary | ICD-10-CM | POA: Diagnosis not present

## 2021-11-29 DIAGNOSIS — Z7901 Long term (current) use of anticoagulants: Secondary | ICD-10-CM | POA: Diagnosis not present

## 2021-11-29 DIAGNOSIS — Z8542 Personal history of malignant neoplasm of other parts of uterus: Secondary | ICD-10-CM | POA: Insufficient documentation

## 2021-11-29 DIAGNOSIS — N183 Chronic kidney disease, stage 3 unspecified: Secondary | ICD-10-CM | POA: Insufficient documentation

## 2021-11-29 DIAGNOSIS — R55 Syncope and collapse: Secondary | ICD-10-CM

## 2021-11-29 DIAGNOSIS — Z20822 Contact with and (suspected) exposure to covid-19: Secondary | ICD-10-CM | POA: Diagnosis not present

## 2021-11-29 DIAGNOSIS — I10 Essential (primary) hypertension: Secondary | ICD-10-CM

## 2021-11-29 DIAGNOSIS — I5032 Chronic diastolic (congestive) heart failure: Secondary | ICD-10-CM | POA: Diagnosis not present

## 2021-11-29 DIAGNOSIS — I130 Hypertensive heart and chronic kidney disease with heart failure and stage 1 through stage 4 chronic kidney disease, or unspecified chronic kidney disease: Secondary | ICD-10-CM | POA: Diagnosis not present

## 2021-11-29 LAB — URINALYSIS, ROUTINE W REFLEX MICROSCOPIC
Bilirubin Urine: NEGATIVE
Glucose, UA: NEGATIVE mg/dL
Ketones, ur: NEGATIVE mg/dL
Leukocytes,Ua: NEGATIVE
Nitrite: NEGATIVE
Protein, ur: NEGATIVE mg/dL
Specific Gravity, Urine: 1.01 (ref 1.005–1.030)
pH: 6 (ref 5.0–8.0)

## 2021-11-29 LAB — COMPREHENSIVE METABOLIC PANEL
ALT: 21 U/L (ref 0–44)
AST: 32 U/L (ref 15–41)
Albumin: 3.2 g/dL — ABNORMAL LOW (ref 3.5–5.0)
Alkaline Phosphatase: 67 U/L (ref 38–126)
Anion gap: 8 (ref 5–15)
BUN: 49 mg/dL — ABNORMAL HIGH (ref 8–23)
CO2: 26 mmol/L (ref 22–32)
Calcium: 9.4 mg/dL (ref 8.9–10.3)
Chloride: 102 mmol/L (ref 98–111)
Creatinine, Ser: 1.92 mg/dL — ABNORMAL HIGH (ref 0.44–1.00)
GFR, Estimated: 25 mL/min — ABNORMAL LOW (ref >60)
Glucose, Bld: 124 mg/dL — ABNORMAL HIGH (ref 70–99)
Potassium: 5.6 mmol/L — ABNORMAL HIGH (ref 3.5–5.1)
Sodium: 136 mmol/L (ref 135–145)
Total Bilirubin: 0.8 mg/dL (ref 0.3–1.2)
Total Protein: 6.1 g/dL — ABNORMAL LOW (ref 6.5–8.1)

## 2021-11-29 LAB — CBC WITH DIFFERENTIAL/PLATELET
Abs Immature Granulocytes: 0.02 10*3/uL (ref 0.00–0.07)
Basophils Absolute: 0 10*3/uL (ref 0.0–0.1)
Basophils Relative: 1 %
Eosinophils Absolute: 0.1 10*3/uL (ref 0.0–0.5)
Eosinophils Relative: 2 %
HCT: 38 % (ref 36.0–46.0)
Hemoglobin: 11.9 g/dL — ABNORMAL LOW (ref 12.0–15.0)
Immature Granulocytes: 0 %
Lymphocytes Relative: 20 %
Lymphs Abs: 1.1 10*3/uL (ref 0.7–4.0)
MCH: 31.2 pg (ref 26.0–34.0)
MCHC: 31.3 g/dL (ref 30.0–36.0)
MCV: 99.5 fL (ref 80.0–100.0)
Monocytes Absolute: 0.8 10*3/uL (ref 0.1–1.0)
Monocytes Relative: 13 %
Neutro Abs: 3.7 10*3/uL (ref 1.7–7.7)
Neutrophils Relative %: 64 %
Platelets: 203 10*3/uL (ref 150–400)
RBC: 3.82 MIL/uL — ABNORMAL LOW (ref 3.87–5.11)
RDW: 14.5 % (ref 11.5–15.5)
WBC: 5.7 10*3/uL (ref 4.0–10.5)
nRBC: 0 % (ref 0.0–0.2)

## 2021-11-29 LAB — TSH: TSH: 3.135 u[IU]/mL (ref 0.350–4.500)

## 2021-11-29 LAB — PROTIME-INR
INR: 1.2 (ref 0.8–1.2)
Prothrombin Time: 15 seconds (ref 11.4–15.2)

## 2021-11-29 LAB — URINALYSIS, MICROSCOPIC (REFLEX): Bacteria, UA: NONE SEEN

## 2021-11-29 LAB — TROPONIN I (HIGH SENSITIVITY)
Troponin I (High Sensitivity): 6 ng/L (ref <18)
Troponin I (High Sensitivity): 6 ng/L (ref <18)

## 2021-11-29 LAB — MAGNESIUM: Magnesium: 2.3 mg/dL (ref 1.7–2.4)

## 2021-11-29 MED ORDER — ATORVASTATIN CALCIUM 10 MG PO TABS
10.0000 mg | ORAL_TABLET | Freq: Every day | ORAL | Status: DC
  Administered 2021-11-29: 23:00:00 10 mg via ORAL
  Filled 2021-11-29: qty 1

## 2021-11-29 MED ORDER — APIXABAN 2.5 MG PO TABS
2.5000 mg | ORAL_TABLET | Freq: Two times a day (BID) | ORAL | Status: DC
  Administered 2021-11-29 – 2021-11-30 (×2): 2.5 mg via ORAL
  Filled 2021-11-29 (×2): qty 1

## 2021-11-29 MED ORDER — ACETAMINOPHEN 325 MG PO TABS: 650.0000 mg | ORAL_TABLET | ORAL | Status: DC | PRN

## 2021-11-29 MED ORDER — NITROGLYCERIN 0.4 MG SL SUBL: 0.4000 mg | SUBLINGUAL_TABLET | SUBLINGUAL | Status: DC | PRN

## 2021-11-29 MED ORDER — AMLODIPINE BESYLATE 5 MG PO TABS
5.0000 mg | ORAL_TABLET | Freq: Every day | ORAL | Status: DC
  Administered 2021-11-29 – 2021-11-30 (×2): 5 mg via ORAL
  Filled 2021-11-29 (×2): qty 1

## 2021-11-29 MED ORDER — VITAMIN B-12 250 MCG PO TABS: 250.0000 ug | ORAL_TABLET | ORAL | Status: DC

## 2021-11-29 MED ORDER — VITAMIN D 25 MCG (1000 UNIT) PO TABS: 125.0000 ug | ORAL_TABLET | Freq: Every day | ORAL | Status: DC

## 2021-11-29 MED ORDER — ALBUTEROL SULFATE (2.5 MG/3ML) 0.083% IN NEBU: 3.0000 mL | INHALATION_SOLUTION | RESPIRATORY_TRACT | Status: DC | PRN

## 2021-11-29 MED ORDER — PANTOPRAZOLE SODIUM 40 MG PO TBEC
40.0000 mg | DELAYED_RELEASE_TABLET | Freq: Every day | ORAL | Status: DC
  Administered 2021-11-30: 09:00:00 40 mg via ORAL
  Filled 2021-11-29: qty 1

## 2021-11-29 MED ORDER — ALPRAZOLAM 0.25 MG PO TABS: 0.2500 mg | ORAL_TABLET | Freq: Two times a day (BID) | ORAL | Status: DC | PRN

## 2021-11-29 MED ORDER — SODIUM CHLORIDE 0.9% FLUSH: 3.0000 mL | INTRAVENOUS | Status: DC | PRN

## 2021-11-29 MED ORDER — HYDRALAZINE HCL 50 MG PO TABS
50.0000 mg | ORAL_TABLET | Freq: Three times a day (TID) | ORAL | Status: DC
  Administered 2021-11-29 – 2021-11-30 (×3): 50 mg via ORAL
  Filled 2021-11-29 (×2): qty 1
  Filled 2021-11-29: qty 2

## 2021-11-29 MED ORDER — ONDANSETRON HCL 4 MG/2ML IJ SOLN: 4.0000 mg | Freq: Four times a day (QID) | INTRAMUSCULAR | Status: DC | PRN

## 2021-11-29 MED ORDER — SODIUM CHLORIDE 0.9 % IV SOLN: 250.0000 mL | INTRAVENOUS | Status: DC | PRN

## 2021-11-29 MED ORDER — ASCORBIC ACID 500 MG PO TABS
500.0000 mg | ORAL_TABLET | Freq: Every day | ORAL | Status: DC
  Administered 2021-11-30: 09:00:00 500 mg via ORAL
  Filled 2021-11-29: qty 1

## 2021-11-29 MED ORDER — TORSEMIDE 20 MG PO TABS: 10.0000 mg | ORAL_TABLET | ORAL | Status: DC

## 2021-11-29 MED ORDER — LETROZOLE 2.5 MG PO TABS: 2.5000 mg | ORAL_TABLET | Freq: Every day | ORAL | Status: DC

## 2021-11-29 MED ORDER — SODIUM CHLORIDE 0.9% FLUSH
3.0000 mL | Freq: Two times a day (BID) | INTRAVENOUS | Status: DC
  Administered 2021-11-29 – 2021-11-30 (×2): 3 mL via INTRAVENOUS

## 2021-11-29 MED ORDER — LETROZOLE 2.5 MG PO TABS
2.5000 mg | ORAL_TABLET | Freq: Every day | ORAL | Status: DC
  Administered 2021-11-30: 09:00:00 2.5 mg via ORAL
  Filled 2021-11-29: qty 1

## 2021-11-29 MED ORDER — ZOLPIDEM TARTRATE 5 MG PO TABS: 5.0000 mg | ORAL_TABLET | Freq: Every evening | ORAL | Status: DC | PRN

## 2021-11-29 MED ORDER — ADULT MULTIVITAMIN W/MINERALS CH
1.0000 | ORAL_TABLET | Freq: Every day | ORAL | Status: DC
  Administered 2021-11-30: 09:00:00 1 via ORAL
  Filled 2021-11-29: qty 1

## 2021-11-29 MED ORDER — CYANOCOBALAMIN 500 MCG PO TABS: 250.0000 ug | ORAL_TABLET | ORAL | Status: DC

## 2021-11-29 NOTE — H&P (Addendum)
Cardiology History and Physical:   Patient ID: [name] MRN: [id number]; DOB: 15-Jan-1933  Admit date: 11/29/2021 Date of Consult: 11/29/2021  PCP:  Orvis Brill, [facility] Cardiologist:  Fransico Him, MD        Patient Profile:   Christina Morgan is a 87 y.o. female with a hx of PAF on Eliquis and flecainide, HTN, HLD, LE edema, D-CHF w/ mod AI, nl MV 2015, osteopenia, CKD III, who is being seen 11/29/2021 for the evaluation of bradycardia at the request of Dr Doren Custard.  History of Present Illness:   Christina Morgan was last seen by Dr Radford Pax on 04/26/2021. Wt was 167 lbs, euvolemic, in SR, HR 60 on atenolol 100 mg qd.  Other blood pressure medications included lisinopril 10 mg daily, and clonidine 0.1 mg twice daily  She was referred to nephrology for decreased kidney function, and Dr. Radford Pax recommended that they manage her blood pressure.  According to her daughter, she has had several medication changes recently.  Most recent one was last week, but she is not sure what it is.    On admission today, her blood pressure medications include atenolol 100 mg daily, clonidine 0.2 mg twice daily, lisinopril 10 mg daily, Aldactone 12.5 mg daily, and torsemide 10 mg 3 times a week.  Over the last 3 days, she has had a distinct change in her condition.  On a daily basis, she takes her morning medications and then has Cheerios for breakfast.  3 days ago, she started having episodes of weakness and being a little lightheaded.  These would come on and last maybe 10 minutes.  She thinks she might have had her blood pressure taken during 1 of these episodes, but was not told that her heart rate was low.  Day before yesterday, she had multiple episodes of the same and then yesterday, she had many more episodes.  They all resolved spontaneously.  She did not lose consciousness or fall.  She had no chest pain during any of these episodes.  Today, she had a blood  pressure taken and they got a heart rate of 32.  This was repeated manually, and her heart rate was about the same.  Physical therapy noted that her heart rate was low as well.  The nurse was called and told that her heart rate was low and she checked it as well.  She then called 911 and she came to the hospital.  Her initial ECG is sinus bradycardia with a heart rate in the 30s.  Currently, her heart rate is in the 40s and she is asymptomatic.  Otherwise, she has been doing well.  Her lower extremity edema has improved.  She denies dyspnea on exertion, orthopnea or PND.  Her weight has been stable.  She has had no chest pain  Currently in the ER, she is resting comfortably.   Past Medical History:  Diagnosis Date   Cancer [facility])    endometrial ca   Chronic diastolic CHF (congestive heart failure) (HCC)    Chronic edema    CKD (chronic kidney disease), stage III (HCC)    Dyslipidemia    Fracture of left humerus    History of cardiovascular stress test    Lexiscan Myoview (06/2014): No ischemia or scar, not gated, low risk   Hx of echocardiogram    Echo (05/02/14): EF 60% to 65%. No regional wall motion abnormalities. Mild AI, mildly dilated aortic root (37 mm), trivial MR, trivial  TR   Hyperlipidemia    Hypertension    Lipoma of skin    multiple   Osteopenia    PAF (paroxysmal atrial fibrillation) ([place])    failed DCCV 05/2014 >> Flecainide started >> DCCV 7/15 sucessful;  f/u ETT neg for pro-arrhythmia >> recurrent AF/AFL >> Flecainide inc to 100 bid with repeat DCCV 9/15   Vitamin D deficiency 09/25/2019    Past Surgical History:  Procedure Laterality Date   CARDIOVERSION N/A 06/05/2014   Procedure: CARDIOVERSION;  Surgeon: Sueanne Margarita, MD;  Location: [place];  Service: Cardiovascular;  Laterality: N/A;   CARDIOVERSION N/A 07/11/2014   Procedure: CARDIOVERSION;  Surgeon: Sueanne Margarita, MD;  Location: [place] ENDOSCOPY;  Service: Cardiovascular;  Laterality: N/A;   CARDIOVERSION  N/A 09/04/2014   Procedure: CARDIOVERSION;  Surgeon: Sueanne Margarita, MD;  Location: [facility] ENDOSCOPY;  Service: Cardiovascular;  Laterality: N/A;   COLONOSCOPY  2008   COLONOSCOPY N/A 05/20/2015   Procedure: COLONOSCOPY;  Surgeon: Ronald Lobo, MD;  Location: [facility] ENDOSCOPY;  Service: Endoscopy;  Laterality: N/A;   FEMUR IM NAIL Right 09/24/2019   Procedure: INTRAMEDULLARY (IM) NAIL FEMORAL;  Surgeon: Altamese La Villita, MD;  Location: [place];  Service: Orthopedics;  Laterality: Right;   IR KYPHO EA ADDL LEVEL THORACIC OR LUMBAR  04/12/2019   IR KYPHO THORACIC WITH BONE BIOPSY  04/12/2019   ROBOTIC ASSISTED SUPRACERVICAL HYSTERECTOMY WITH BILATERAL SALPINGO OOPHERECTOMY  11/06/2012   and bilateral pelvic lymph node dissection     Home Medications:  Prior to Admission medications   Medication Sig Start Date End Date Taking? Authorizing Provider  acetaminophen (TYLENOL) 500 MG tablet Take 1,000 mg by mouth 3 (three) times daily as needed for moderate pain or mild pain.   Yes [provider]  albuterol (VENTOLIN HFA) 108 (90 Base) MCG/ACT inhaler Inhale 2 puffs into the lungs every 4 (four) hours as needed for wheezing or shortness of breath (please try to use ALbuterol inhaler PRIOR to Nebs). 10/11/19  Yes Angiulli, Lavon Paganini, PA-C  apixaban (ELIQUIS) 2.5 MG TABS tablet Take 1 tablet (2.5 mg total) by mouth 2 (two) times daily. 04/26/21  Yes Turner, Eber Hong, MD  atenolol (TENORMIN) 100 MG tablet Take 1 tablet (100 mg total) by mouth daily. 04/26/21  Yes Turner, Eber Hong, MD  atorvastatin (LIPITOR) 10 MG tablet Take 1 tablet (10 mg total) by mouth at bedtime. 04/26/21  Yes Turner, Eber Hong, MD  Cholecalciferol (VITAMIN D) 125 MCG (5000 UT) CAPS Take 125 mcg by mouth daily.   Yes [provider]  cloNIDine (CATAPRES) 0.2 MG tablet Take 0.2 mg by mouth 2 (two) times daily. 11/01/21  Yes [provider]  flecainide (TAMBOCOR) 100 MG tablet Take 1 tablet (100 mg total) by mouth 2 (two) times  daily. 04/26/21  Yes Turner, Eber Hong, MD  letrozole (FEMARA) 2.5 MG tablet Take 2.5 mg by mouth daily.   Yes [provider]  lisinopril (ZESTRIL) 10 MG tablet Take 10 mg by mouth in the morning and at bedtime. 10/03/20  Yes [provider]  Multiple Vitamin (DAILY VITE PO) Take 1 tablet by mouth daily.   Yes [provider]  NYSTATIN powder Apply 1 g topically See admin instructions. Spread topically to area under bilateral breasts & groin twice a day at 8 AM and 8 PM 01/29/20  Yes [provider]  omeprazole (PRILOSEC) 20 MG capsule Take 20 mg by mouth daily. 04/14/21  Yes [provider]  spironolactone (ALDACTONE)  25 MG tablet Take 12.5 mg by mouth daily. 11/23/21  Yes [provider]  torsemide (DEMADEX) 10 MG tablet Take 10 mg by mouth 3 (three) times a week. Mon. Wed. Fri. 11/15/21  Yes [provider]  vitamin B-12 (CYANOCOBALAMIN) 250 MCG tablet Take 250 mcg by mouth every other day.   Yes [provider]  vitamin C (VITAMIN C) 500 MG tablet Take 1 tablet (500 mg total) by mouth daily. 09/28/19  Yes Antonieta Pert, MD    Inpatient Medications: Scheduled Meds:  Continuous Infusions:  PRN Meds:   Allergies:    Allergies  Allergen Reactions   Penicillins Itching and Other (See Comments)    Dizziness to the patient of passing out- told by MD to "never take this again" Did it involve swelling of the face/tongue/throat, SOB, or low BP? No Did it involve sudden or severe rash/hives, skin peeling, or any reaction on the inside of your mouth or nose? No Did you need to seek medical attention at a hospital or doctor's office? No When did it last happen? "a long time ago"    If all above answers are "NO", may proceed with cephalosporin use.     Social History:   Social History   Socioeconomic History   Marital status: Widowed    Spouse name: Not on file   Number of children: Not on file   Years of education: Not on  file   Highest education level: Not on file  Occupational History   Not on file  Tobacco Use   Smoking status: Never   Smokeless tobacco: Never  Vaping Use   Vaping Use: Never used  Substance and Sexual Activity   Alcohol use: No   Drug use: No   Sexual activity: Not on file  Other Topics Concern   Not on file  Social History Narrative   ** Merged History Encounter **       Social Determinants of Health   Financial Resource Strain: Not on file  Food Insecurity: Not on file  Transportation Needs: Not on file  Physical Activity: Not on file  Stress: Not on file  Social Connections: Not on file  Intimate Partner Violence: Not on file    Family History:    Family History  Problem Relation Age of Onset   Cancer Father 11       metastatic oropharyngeal ca   Heart attack Mother    Coronary artery disease Mother      ROS:  Please see the history of present illness.  All other ROS reviewed and negative.     Physical Exam/Data:   Vitals:   11/29/21 1515 11/29/21 1545 11/29/21 1600 11/29/21 1630  BP: (!) 160/57 (!) 193/66 (!) 205/71 (!) 202/67  Pulse: (!) 45 76 (!) 50 (!) 53  Resp: 13 17 15 16   Temp:      TempSrc:      SpO2: 100% 100% 100% 100%  Weight:      Height:       No intake or output data in the 24 hours ending 11/29/21 1646 Last 3 Weights 11/29/2021 04/26/2021 10/13/2020  Weight (lbs) 174 lb 1.6 oz 167 lb 9.6 oz 160 lb  Weight (kg) 78.971 kg 76.023 kg 72.576 kg     Body mass index is 28.1 kg/m.  General:  Well nourished, well developed, in no acute distress HEENT: normal Neck: no JVD Vascular: No carotid bruits; Distal pulses 2+ bilaterally Cardiac:  normal S1, S2; RRR;  no murmur  Lungs:  clear to auscultation bilaterally, no wheezing, rhonchi or rales  Abd: soft, nontender, no hepatomegaly  Ext: no edema Musculoskeletal:  No deformities, BUE and BLE strength weak but equal Skin: warm and dry  Neuro:  CNs 2-12 intact, no focal abnormalities  noted Psych:  Normal affect   EKG:  The EKG was personally reviewed and demonstrates:  sinus brady w/ HR 39 Telemetry:  Telemetry was personally reviewed and demonstrates:  SR, Sinus brady  Relevant CV Studies:  ECHO: 02/12/2020  1. Left ventricular ejection fraction, by estimation, is 60 to 65%. The  left ventricle has normal function. The left ventricle has no regional  wall motion abnormalities. Left ventricular diastolic parameters are  consistent with Grade II diastolic dysfunction (pseudonormalization).   2. Left atrial size was mildly dilated.   3. The mitral valve is normal in structure and function. No evidence of mitral valve regurgitation. No evidence of mitral stenosis.   4. The aortic valve is tricuspid. Aortic valve regurgitation is mild.  Mild to moderate aortic valve sclerosis/calcification is present, without any evidence of aortic stenosis.   5. Tricuspid regurgitation signal is inadequate for assessing PA  pressure.   6. The inferior vena cava is normal in size with greater than 50%  respiratory variability, suggesting right atrial pressure of 3 mmHg.   7. Technically difficult study with poor acoustic windows.   MYOVIEW: 01/10/2019 Nuclear stress EF: 62%. The study is normal. This is a low risk study. There was no ST segment deviation noted during stress. No T wave inversion was noted during stress.   Low risk stress nuclear study with normal perfusion and normal left ventricular regional and global systolic function.  Laboratory Data:  High Sensitivity Troponin:   Recent Labs  Lab 11/29/21 1116 11/29/21 1338  TROPONINIHS 6 6      Chemistry Recent Labs  Lab 11/29/21 1116  NA 136  K 5.6*  CL 102  CO2 26  GLUCOSE 124*  BUN 49*  CREATININE 1.92*  CALCIUM 9.4  MG 2.3  GFRNONAA 25*  ANIONGAP 8     Recent Labs  Lab 11/29/21 1116  PROT 6.1*  ALBUMIN 3.2*  AST 32  ALT 21  ALKPHOS 67  BILITOT 0.8    Lipids No results for input(s): CHOL,  TRIG, HDL, LABVLDL, LDLCALC, CHOLHDL in the last 168 hours.  Hematology Recent Labs  Lab 11/29/21 1116  WBC 5.7  RBC 3.82*  HGB 11.9*  HCT 38.0  MCV 99.5  MCH 31.2  MCHC 31.3  RDW 14.5  PLT 203    Thyroid No results for input(s): TSH, FREET4 in the last 168 hours.  BNPNo results for input(s): BNP, PROBNP in the last 168 hours.  DDimer No results for input(s): DDIMER in the last 168 hours.   Radiology/Studies:  DG Chest Portable 1 View  Result Date: 11/29/2021 CLINICAL DATA:  Bradycardia EXAM: PORTABLE CHEST 1 VIEW COMPARISON:  02/11/2020 FINDINGS: Heart size is mildly enlarged. Aortic atherosclerosis. Slightly low lung volumes with probable left basilar atelectasis. No pleural effusion or pneumothorax. Chronic deformity of the left humeral neck. IMPRESSION: Mild cardiomegaly. Slightly low lung volumes with probable left basilar atelectasis. Electronically Signed   By: Davina Poke D.O.   On: 11/29/2021 11:42     Assessment and Plan:   Presyncope -This has been happening in the setting of she has taken her medications and then eaten breakfast.  The symptoms would start an hour or so after breakfast. -  While she is significantly bradycardic, some of the symptoms may be related to her blood pressure medications hitting her all at once. - Perhaps any once a day med could be taken at night.  2.  Bradycardia - She has been on atenolol 100 mg daily for a long time without significant bradycardia - Clonidine was previously 0.1 mg twice daily was increased to 0.2 mg twice daily - Discussed with MD, stop the clonidine and the atenolol - watch for rebound tachycardia, we may be able to restart a BB later, and at a low dose  3.  Hypertension: - She says that her blood pressure is chronically very hard to control. - SBP was running over 200 on a regular basis. - Although her blood pressure is improved, it is not yet at goal - with her BB/Clonidine being stopped >> amlodipine 5 mg  qd, watch for edema - will also have to stop the spironolactone and torsemide due to hyperkalemia and renal insufficiency >> hydralazine 50 mg tid  Risk Assessment/Risk Scores:     For questions or updates, please contact [place] Please consult [email or web address] for contact info under   Signed, Rosaria Ferries, PA-C  11/29/2021 4:46 PM As above, patient seen and examined.  Briefly she is an 87 year old female with past medical history of paroxysmal atrial fibrillation, hypertension, hyperlipidemia, chronic diastolic congestive heart failure, moderate aortic insufficiency, chronic stage III kidney disease for evaluation of bradycardia.  Over the past 3 days patient states she has had near "fainting" spells.  They are sudden in onset and lasts for 1 to 2 seconds and resolve.  She has not had frank syncope.  She otherwise denies dyspnea, chest pain, palpitations or worsening edema.  She was seen at her facility and her heart rate was checked and was noted to be in the 30s.  She was sent to the emergency room for further evaluation. Electrocardiogram shows marked sinus bradycardia with a heart rate of 39, first-degree AV block and prior anterior infarct cannot be excluded.  SEM 5.6, creatinine 1.92, troponin is normal, hemoglobin 11.9.  1 bradycardia-patient presents with near syncopal episodes and is noted to be bradycardic by ECG.  She was noted to have pauses on telemetry in the emergency room with 1 measuring 4.7 seconds.  Will discontinue atenolol and clonidine.  Follow on telemetry.  I think she will likely not require pacing once these medications washed out of her system.  2 hypertension-patient's blood pressure is elevated and I am also discontinuing atenolol and clonidine due to bradycardia.  Add hydralazine 50 mg 3 times daily and amlodipine 5 mg daily.  Follow blood pressure and advance medications as needed.  3 hyperkalemia-discontinue spironolactone.  Hold lisinopril and recheck  potassium tomorrow.  Will resume lisinopril if potassium improved.  4 chronic stage III kidney disease-follow renal function while in house.  5 chronic diastolic congestive heart failure-continue Demadex at present dose.  She does not appear to be volume overloaded on examination.  6 paroxysmal atrial fibrillation-we will hold flecainide for now and resume tomorrow morning.  Continue apixaban.  Kirk Ruths MD

## 2021-11-29 NOTE — ED Provider Notes (Signed)
[facility] EMERGENCY DEPARTMENT Provider Note   CSN: [id number] Arrival date & time: 11/29/21  1113     History Chief Complaint  Patient presents with   Bradycardia    Christina Morgan is a 87 y.o. female.  HPI Patient presents for bradycardia.  She reportedly typically has a heart rate in the 50s.  She is on atenolol.  This morning, she was working with her physical therapy and was noted to have a heart rate in the 30s.  She does endorse recent near syncopal symptoms while standing.  EMS was called.  EMS obtained 12 leads which show bradycardia without clear P waves.  They stated that on the monitor, she would occasionally have P waves.  History also notable for paroxysmal atrial fibrillation.  She is on Eliquis.  Patient typically takes her morning medicines before breakfast.  Over the past several days, she has noticed near syncopal symptoms that seem to occur after breakfast.  She did take her morning medicines today.  She is uncertain of which medications she does take in the morning versus in the evening.  Warning symptoms over the past several days have been transient.  She denies any substantial near syncopal symptoms throughout the rest of the day.  She did recently have a fall and suffered a neck fracture.  She has since been using a wheelchair.  She is able to walk with a walker.  She has recently preferred the wheelchair to avoid further falls.  Patient denies any current symptoms.    Past Medical History:  Diagnosis Date   Cancer [facility])    endometrial ca   Chronic diastolic CHF (congestive heart failure) (HCC)    Chronic edema    CKD (chronic kidney disease), stage III (HCC)    Dyslipidemia    Fracture of left humerus    History of cardiovascular stress test    Lexiscan Myoview (06/2014): No ischemia or scar, not gated, low risk   Hx of echocardiogram    Echo (05/02/14): EF 60% to 65%. No regional wall motion abnormalities. Mild AI, mildly dilated  aortic root (37 mm), trivial MR, trivial TR   Hyperlipidemia    Hypertension    Lipoma of skin    multiple   Osteopenia    PAF (paroxysmal atrial fibrillation) ([place])    failed DCCV 05/2014 >> Flecainide started >> DCCV 7/15 sucessful;  f/u ETT neg for pro-arrhythmia >> recurrent AF/AFL >> Flecainide inc to 100 bid with repeat DCCV 9/15   Vitamin D deficiency 09/25/2019    Patient Active Problem List   Diagnosis Date Noted   Bacteremia due to Gram-positive bacteria 02/13/2020   Acute encephalopathy 02/11/2020   Cervical spine fracture ([place]) 02/11/2020   CAP (community acquired pneumonia) 02/11/2020   Chronic diastolic congestive heart failure ([place])    Stage 3 chronic kidney disease ([place])    PAF (paroxysmal atrial fibrillation) ([place] )    Closed displaced intertrochanteric fracture of right femur ([place])    Fall 09/23/2019   SOB (shortness of breath) 01/04/2019   Bilateral lower leg cellulitis 03/30/2018   Chronic diastolic heart failure ([place]) 03/30/2018   Edema of extremities 08/19/2014   Essential hypertension, benign 05/15/2014   Mixed hyperlipidemia 05/15/2014   Persistent atrial fibrillation ([place]) 05/15/2014   Endometrial ca ([place]) 12/28/2012    Past Surgical History:  Procedure Laterality Date   CARDIOVERSION N/A 06/05/2014   Procedure: CARDIOVERSION;  Surgeon: Sueanne Margarita, MD;  Location: [place];  Service: Cardiovascular;  Laterality: N/A;   CARDIOVERSION N/A 07/11/2014   Procedure: CARDIOVERSION;  Surgeon: Sueanne Margarita, MD;  Location: [place];  Service: Cardiovascular;  Laterality: N/A;   CARDIOVERSION N/A 09/04/2014   Procedure: CARDIOVERSION;  Surgeon: Sueanne Margarita, MD;  Location: [facility] ENDOSCOPY;  Service: Cardiovascular;  Laterality: N/A;   COLONOSCOPY  2008   COLONOSCOPY N/A 05/20/2015   Procedure: COLONOSCOPY;  Surgeon: Ronald Lobo, MD;  Location: [facility] ENDOSCOPY;  Service: Endoscopy;  Laterality: N/A;   FEMUR IM NAIL Right 09/24/2019   Procedure:  INTRAMEDULLARY (IM) NAIL FEMORAL;  Surgeon: Altamese Eek, MD;  Location: [place];  Service: Orthopedics;  Laterality: Right;   IR KYPHO EA ADDL LEVEL THORACIC OR LUMBAR  04/12/2019   IR KYPHO THORACIC WITH BONE BIOPSY  04/12/2019   ROBOTIC ASSISTED SUPRACERVICAL HYSTERECTOMY WITH BILATERAL SALPINGO OOPHERECTOMY  11/06/2012   and bilateral pelvic lymph node dissection     OB History   No obstetric history on file.    Obstetric Comments  G2P2 with 2 vaginal deliveries. Reports menarche in her teens and menopause in her 73 s. Denies hx of abnormal pap smears. Patient reports last mammogram was 04/2012.         Family History  Problem Relation Age of Onset   Cancer Father 29       metastatic oropharyngeal ca   Heart attack Mother    Coronary artery disease Mother     Social History   Tobacco Use   Smoking status: Never   Smokeless tobacco: Never  Vaping Use   Vaping Use: Never used  Substance Use Topics   Alcohol use: No   Drug use: No    Home Medications Prior to Admission medications   Medication Sig Start Date End Date Taking? Authorizing Provider  acetaminophen (TYLENOL) 500 MG tablet Take 1,000 mg by mouth 3 (three) times daily as needed for moderate pain or mild pain.   Yes [provider]  albuterol (VENTOLIN HFA) 108 (90 Base) MCG/ACT inhaler Inhale 2 puffs into the lungs every 4 (four) hours as needed for wheezing or shortness of breath (please try to use ALbuterol inhaler PRIOR to Nebs). 10/11/19  Yes Angiulli, Lavon Paganini, PA-C  apixaban (ELIQUIS) 2.5 MG TABS tablet Take 1 tablet (2.5 mg total) by mouth 2 (two) times daily. 04/26/21  Yes Turner, Eber Hong, MD  atenolol (TENORMIN) 100 MG tablet Take 1 tablet (100 mg total) by mouth daily. 04/26/21  Yes Turner, Eber Hong, MD  atorvastatin (LIPITOR) 10 MG tablet Take 1 tablet (10 mg total) by mouth at bedtime. 04/26/21  Yes Turner, Eber Hong, MD  Cholecalciferol (VITAMIN D) 125 MCG (5000 UT) CAPS Take 125 mcg by mouth daily.    Yes [provider]  cloNIDine (CATAPRES) 0.2 MG tablet Take 0.2 mg by mouth 2 (two) times daily. 11/01/21  Yes [provider]  flecainide (TAMBOCOR) 100 MG tablet Take 1 tablet (100 mg total) by mouth 2 (two) times daily. 04/26/21  Yes Turner, Eber Hong, MD  letrozole (FEMARA) 2.5 MG tablet Take 2.5 mg by mouth daily.   Yes [provider]  lisinopril (ZESTRIL) 10 MG tablet Take 10 mg by mouth in the morning and at bedtime. 10/03/20  Yes [provider]  Multiple Vitamin (DAILY VITE PO) Take 1 tablet by mouth daily.   Yes [provider]  NYSTATIN powder Apply 1 g topically See admin instructions. Spread topically to area under bilateral breasts & groin twice a day at 8 AM  and 8 PM 01/29/20  Yes [provider]  omeprazole (PRILOSEC) 20 MG capsule Take 20 mg by mouth daily. 04/14/21  Yes [provider]  spironolactone (ALDACTONE) 25 MG tablet Take 12.5 mg by mouth daily. 11/23/21  Yes [provider]  torsemide (DEMADEX) 10 MG tablet Take 10 mg by mouth 3 (three) times a week. Mon. Wed. Fri. 11/15/21  Yes [provider]  vitamin B-12 (CYANOCOBALAMIN) 250 MCG tablet Take 250 mcg by mouth every other day.   Yes [provider]  vitamin C (VITAMIN C) 500 MG tablet Take 1 tablet (500 mg total) by mouth daily. 09/28/19  Yes Antonieta Pert, MD    Allergies    Penicillins  Review of Systems   Review of Systems  Constitutional:  Negative for activity change, appetite change, chills, fatigue and fever.  HENT:  Negative for ear pain and sore throat.   Eyes:  Negative for pain and visual disturbance.  Respiratory:  Negative for cough, chest tightness, shortness of breath and wheezing.   Cardiovascular:  Positive for leg swelling (Chronic). Negative for chest pain and palpitations.  Gastrointestinal:  Negative for abdominal pain, diarrhea, nausea and vomiting.  Genitourinary:  Negative for dysuria, flank pain, hematuria  and pelvic pain.  Musculoskeletal:  Negative for arthralgias, back pain, joint swelling and myalgias.  Skin:  Negative for color change and rash.  Neurological:  Positive for dizziness and light-headedness. Negative for seizures, syncope, weakness, numbness and headaches.  Hematological:  Bruises/bleeds easily (On Eliquis).  Psychiatric/Behavioral:  Negative for confusion and decreased concentration.   All other systems reviewed and are negative.  Physical Exam Updated Vital Signs BP (!) 179/64    Pulse 97    Temp (!) 96.6 F (35.9 C) (Oral)    Resp 13    Ht 5\' 6"  (1.676 m)    Wt 79 kg    SpO2 100%    BMI 28.10 kg/m   Physical Exam Vitals and nursing note reviewed.  Constitutional:      General: She is not in acute distress.    Appearance: Normal appearance. She is well-developed. She is not ill-appearing, toxic-appearing or diaphoretic.  HENT:     Head: Normocephalic and atraumatic.     Right Ear: External ear normal.     Left Ear: External ear normal.     Nose: Nose normal. No congestion.     Mouth/Throat:     Mouth: Mucous membranes are moist.     Pharynx: Oropharynx is clear.  Eyes:     General: No scleral icterus.    Extraocular Movements: Extraocular movements intact.     Conjunctiva/sclera: Conjunctivae normal.  Cardiovascular:     Rate and Rhythm: Regular rhythm. Bradycardia present.     Heart sounds: No murmur heard. Pulmonary:     Effort: Pulmonary effort is normal. No respiratory distress.     Breath sounds: Normal breath sounds. No wheezing or rales.  Chest:     Chest wall: No tenderness.  Abdominal:     Palpations: Abdomen is soft.     Tenderness: There is no abdominal tenderness.  Musculoskeletal:        General: No swelling.     Cervical back: Normal range of motion and neck supple.     Comments: Compression stockings in place  Skin:    General: Skin is warm and dry.     Capillary Refill: Capillary refill takes less than 2 seconds.     Coloration: Skin  is not  jaundiced or pale.  Neurological:     General: No focal deficit present.     Mental Status: She is alert and oriented to person, place, and time.     Cranial Nerves: No cranial nerve deficit.     Sensory: No sensory deficit.     Motor: No weakness.     Coordination: Coordination normal.  Psychiatric:        Mood and Affect: Mood normal.        Behavior: Behavior normal.        Thought Content: Thought content normal.        Judgment: Judgment normal.    ED Results / Procedures / Treatments   Labs (all labs ordered are listed, but only abnormal results are displayed) Labs Reviewed  COMPREHENSIVE METABOLIC PANEL - Abnormal; Notable for the following components:      Result Value   Potassium 5.6 (*)    Glucose, Bld 124 (*)    BUN 49 (*)    Creatinine, Ser 1.92 (*)    Total Protein 6.1 (*)    Albumin 3.2 (*)    GFR, Estimated 25 (*)    All other components within normal limits  CBC WITH DIFFERENTIAL/PLATELET - Abnormal; Notable for the following components:   RBC 3.82 (*)    Hemoglobin 11.9 (*)    All other components within normal limits  URINALYSIS, ROUTINE W REFLEX MICROSCOPIC - Abnormal; Notable for the following components:   Hgb urine dipstick SMALL (*)    All other components within normal limits  MAGNESIUM  PROTIME-INR  URINALYSIS, MICROSCOPIC (REFLEX)  TROPONIN I (HIGH SENSITIVITY)  TROPONIN I (HIGH SENSITIVITY)    EKG EKG Interpretation  Date/Time:  Monday November 29 2021 11:31:46 EST Ventricular Rate:  39 PR Interval:  321 QRS Duration: 128 QT Interval:  509 QTC Calculation: 410 R Axis:   62 Text Interpretation: Sinus or ectopic atrial bradycardia Atrial premature complex Prolonged PR interval Nonspecific intraventricular conduction delay Confirmed by Godfrey Pick [phone]) on 11/29/2021 2:05:13 PM  Radiology DG Chest Portable 1 View  Result Date: 11/29/2021 CLINICAL DATA:  Bradycardia EXAM: PORTABLE CHEST 1 VIEW COMPARISON:  02/11/2020 FINDINGS:  Heart size is mildly enlarged. Aortic atherosclerosis. Slightly low lung volumes with probable left basilar atelectasis. No pleural effusion or pneumothorax. Chronic deformity of the left humeral neck. IMPRESSION: Mild cardiomegaly. Slightly low lung volumes with probable left basilar atelectasis. Electronically Signed   By: Davina Poke D.O.   On: 11/29/2021 11:42    Procedures Procedures   Medications Ordered in ED Medications - No data to display  ED Course  I have reviewed the triage vital signs and the nursing notes.  Pertinent labs & imaging results that were available during my care of the patient were reviewed by me and considered in my medical decision making (see chart for details).    MDM Rules/Calculators/A&P                         87 year old female presents for bradycardia.  Heart rate was noted to be in the 30s prior to arrival.  Initial EKG shows irregularly irregular rhythm with heart rate of 42.  Subsequently, patient appears to be in sinus rhythm on monitor.  Repeat EKG shows sinus bradycardia.  Patient currently denies any symptoms.  She has had near syncopal symptoms that seem to occur in the mornings.  This is shortly after she takes her morning medications.  Her medications include and atenolol.  She is followed by [facility]).  Patient to be kept on bedside cardiac monitor and to undergo laboratory work-up to assess for electrolyte abnormalities.  Showed slightly elevated potassium.  Cardiology was consulted who will come see the patient.  Patient continued to deny any symptoms.  Heart rate, while in the ED, did improve to the range of 50.  Recommendations from cardiology are pending at time of signout.  Care of patient was signed out to oncoming ED provider.   Final Clinical Impression(s) / ED Diagnoses Final diagnoses:  Bradycardia    Rx / DC Orders ED Discharge Orders     None        Godfrey Pick, MD 11/29/21 [phone]

## 2021-11-29 NOTE — Consult Note (Addendum)
Cardiology Consultation:   Patient ID: Christina Morgan MRN: [id number]; DOB: 04-12-1933  Admit date: 11/29/2021 Date of Consult: 11/29/2021  PCP:  Orvis Brill, [facility] Cardiologist:  Fransico Him, MD        Patient Profile:   Christina Morgan is a 87 y.o. female with a hx of PAF on Eliquis and flecainide, HTN, HLD, LE edema, D-CHF w/ mod AI, nl MV 2015, osteopenia, CKD III, who is being seen 11/29/2021 for the evaluation of bradycardia at the request of Dr Doren Custard.  History of Present Illness:   Christina Morgan was last seen by Dr Radford Pax on 04/26/2021. Wt was 167 lbs, euvolemic, in SR, HR 60 on atenolol 100 mg qd.  Other blood pressure medications included lisinopril 10 mg daily, and clonidine 0.1 mg twice daily  She was referred to nephrology for decreased kidney function, and Dr. Radford Pax recommended that they manage her blood pressure.  According to her daughter, she has had several medication changes recently.  Most recent one was last week, but she is not sure what it is.    On admission today, her blood pressure medications include atenolol 100 mg daily, clonidine 0.2 mg twice daily, lisinopril 10 mg daily, Aldactone 12.5 mg daily, and torsemide 10 mg 3 times a week.  Over the last 3 days, she has had a distinct change in her condition.  On a daily basis, she takes her morning medications and then has Cheerios for breakfast.  3 days ago, she started having episodes of weakness and being a little lightheaded.  These would come on and last maybe 10 minutes.  She thinks she might have had her blood pressure taken during 1 of these episodes, but was not told that her heart rate was low.  Day before yesterday, she had multiple episodes of the same and then yesterday, she had many more episodes.  They all resolved spontaneously.  She did not lose consciousness or fall.  She had no chest pain during any of these episodes.  Today, she had a blood pressure  taken and they got a heart rate of 32.  This was repeated manually, and her heart rate was about the same.  Physical therapy noted that her heart rate was low as well.  The nurse was called and told that her heart rate was low and she checked it as well.  She then called 911 and she came to the hospital.  Her initial ECG is sinus bradycardia with a heart rate in the 30s.  Currently, her heart rate is in the 40s and she is asymptomatic.  Otherwise, she has been doing well.  Her lower extremity edema has improved.  She denies dyspnea on exertion, orthopnea or PND.  Her weight has been stable.  She has had no chest pain  Currently in the ER, she is resting comfortably.   Past Medical History:  Diagnosis Date   Cancer [facility])    endometrial ca   Chronic diastolic CHF (congestive heart failure) (HCC)    Chronic edema    CKD (chronic kidney disease), stage III (HCC)    Dyslipidemia    Fracture of left humerus    History of cardiovascular stress test    Lexiscan Myoview (06/2014): No ischemia or scar, not gated, low risk   Hx of echocardiogram    Echo (05/02/14): EF 60% to 65%. No regional wall motion abnormalities. Mild AI, mildly dilated aortic root (37 mm), trivial MR, trivial TR  Hyperlipidemia    Hypertension    Lipoma of skin    multiple   Osteopenia    PAF (paroxysmal atrial fibrillation) (HCC)    failed DCCV 05/2014 >> Flecainide started >> DCCV 7/15 sucessful;  f/u ETT neg for pro-arrhythmia >> recurrent AF/AFL >> Flecainide inc to 100 bid with repeat DCCV 9/15   Vitamin D deficiency 09/25/2019    Past Surgical History:  Procedure Laterality Date   CARDIOVERSION N/A 06/05/2014   Procedure: CARDIOVERSION;  Surgeon: Sueanne Margarita, MD;  Location: [place];  Service: Cardiovascular;  Laterality: N/A;   CARDIOVERSION N/A 07/11/2014   Procedure: CARDIOVERSION;  Surgeon: Sueanne Margarita, MD;  Location: [place] ENDOSCOPY;  Service: Cardiovascular;  Laterality: N/A;   CARDIOVERSION N/A  09/04/2014   Procedure: CARDIOVERSION;  Surgeon: Sueanne Margarita, MD;  Location: [facility] ENDOSCOPY;  Service: Cardiovascular;  Laterality: N/A;   COLONOSCOPY  2008   COLONOSCOPY N/A 05/20/2015   Procedure: COLONOSCOPY;  Surgeon: Ronald Lobo, MD;  Location: [facility] ENDOSCOPY;  Service: Endoscopy;  Laterality: N/A;   FEMUR IM NAIL Right 09/24/2019   Procedure: INTRAMEDULLARY (IM) NAIL FEMORAL;  Surgeon: Altamese [place], MD;  Location: [place];  Service: Orthopedics;  Laterality: Right;   IR KYPHO EA ADDL LEVEL THORACIC OR LUMBAR  04/12/2019   IR KYPHO THORACIC WITH BONE BIOPSY  04/12/2019   ROBOTIC ASSISTED SUPRACERVICAL HYSTERECTOMY WITH BILATERAL SALPINGO OOPHERECTOMY  11/06/2012   and bilateral pelvic lymph node dissection     Home Medications:  Prior to Admission medications   Medication Sig Start Date End Date Taking? Authorizing Provider  acetaminophen (TYLENOL) 500 MG tablet Take 1,000 mg by mouth 3 (three) times daily as needed for moderate pain or mild pain.   Yes [provider]  albuterol (VENTOLIN HFA) 108 (90 Base) MCG/ACT inhaler Inhale 2 puffs into the lungs every 4 (four) hours as needed for wheezing or shortness of breath (please try to use ALbuterol inhaler PRIOR to Nebs). 10/11/19  Yes Angiulli, Lavon Paganini, PA-C  apixaban (ELIQUIS) 2.5 MG TABS tablet Take 1 tablet (2.5 mg total) by mouth 2 (two) times daily. 04/26/21  Yes Turner, Eber Hong, MD  atenolol (TENORMIN) 100 MG tablet Take 1 tablet (100 mg total) by mouth daily. 04/26/21  Yes Turner, Eber Hong, MD  atorvastatin (LIPITOR) 10 MG tablet Take 1 tablet (10 mg total) by mouth at bedtime. 04/26/21  Yes Turner, Eber Hong, MD  Cholecalciferol (VITAMIN D) 125 MCG (5000 UT) CAPS Take 125 mcg by mouth daily.   Yes [provider]  cloNIDine (CATAPRES) 0.2 MG tablet Take 0.2 mg by mouth 2 (two) times daily. 11/01/21  Yes [provider]  flecainide (TAMBOCOR) 100 MG tablet Take 1 tablet (100 mg total) by mouth 2 (two) times daily.  04/26/21  Yes Turner, Eber Hong, MD  letrozole (FEMARA) 2.5 MG tablet Take 2.5 mg by mouth daily.   Yes [provider]  lisinopril (ZESTRIL) 10 MG tablet Take 10 mg by mouth in the morning and at bedtime. 10/03/20  Yes [provider]  Multiple Vitamin (DAILY VITE PO) Take 1 tablet by mouth daily.   Yes [provider]  NYSTATIN powder Apply 1 g topically See admin instructions. Spread topically to area under bilateral breasts & groin twice a day at 8 AM and 8 PM 01/29/20  Yes [provider]  omeprazole (PRILOSEC) 20 MG capsule Take 20 mg by mouth daily. 04/14/21  Yes [provider]  spironolactone (ALDACTONE) 25 MG tablet  Take 12.5 mg by mouth daily. 11/23/21  Yes [provider]  torsemide (DEMADEX) 10 MG tablet Take 10 mg by mouth 3 (three) times a week. Mon. Wed. Fri. 11/15/21  Yes [provider]  vitamin B-12 (CYANOCOBALAMIN) 250 MCG tablet Take 250 mcg by mouth every other day.   Yes [provider]  vitamin C (VITAMIN C) 500 MG tablet Take 1 tablet (500 mg total) by mouth daily. 09/28/19  Yes Antonieta Pert, MD    Inpatient Medications: Scheduled Meds:  Continuous Infusions:  PRN Meds:   Allergies:    Allergies  Allergen Reactions   Penicillins Itching and Other (See Comments)    Dizziness to the patient of passing out- told by MD to "never take this again" Did it involve swelling of the face/tongue/throat, SOB, or low BP? No Did it involve sudden or severe rash/hives, skin peeling, or any reaction on the inside of your mouth or nose? No Did you need to seek medical attention at a hospital or doctor's office? No When did it last happen? "a long time ago"    If all above answers are "NO", may proceed with cephalosporin use.     Social History:   Social History   Socioeconomic History   Marital status: Widowed    Spouse name: Not on file   Number of children: Not on file   Years of education: Not on file    Highest education level: Not on file  Occupational History   Not on file  Tobacco Use   Smoking status: Never   Smokeless tobacco: Never  Vaping Use   Vaping Use: Never used  Substance and Sexual Activity   Alcohol use: No   Drug use: No   Sexual activity: Not on file  Other Topics Concern   Not on file  Social History Narrative   ** Merged History Encounter **       Social Determinants of Health   Financial Resource Strain: Not on file  Food Insecurity: Not on file  Transportation Needs: Not on file  Physical Activity: Not on file  Stress: Not on file  Social Connections: Not on file  Intimate Partner Violence: Not on file    Family History:    Family History  Problem Relation Age of Onset   Cancer Father 34       metastatic oropharyngeal ca   Heart attack Mother    Coronary artery disease Mother      ROS:  Please see the history of present illness.  All other ROS reviewed and negative.     Physical Exam/Data:   Vitals:   11/29/21 1400 11/29/21 1415 11/29/21 1430 11/29/21 1445  BP: (!) 177/64 (!) 153/58 (!) 179/69 (!) 179/64  Pulse: (!) 45 (!) 47 (!) 47 97  Resp: 14 11 16 13   Temp:      TempSrc:      SpO2: 100% 100% 99% 100%  Weight:      Height:       No intake or output data in the 24 hours ending 11/29/21 1510 Last 3 Weights 11/29/2021 04/26/2021 10/13/2020  Weight (lbs) 174 lb 1.6 oz 167 lb 9.6 oz 160 lb  Weight (kg) 78.971 kg 76.023 kg 72.576 kg     Body mass index is 28.1 kg/m.  General:  Well nourished, well developed, in no acute distress HEENT: normal Neck: no JVD Vascular: No carotid bruits; Distal pulses 2+ bilaterally Cardiac:  normal S1, S2; RRR; no murmur  Lungs:  clear to auscultation bilaterally, no wheezing, rhonchi or rales  Abd: soft, nontender, no hepatomegaly  Ext: no edema Musculoskeletal:  No deformities, BUE and BLE strength weak but equal Skin: warm and dry  Neuro:  CNs 2-12 intact, no focal abnormalities noted Psych:   Normal affect   EKG:  The EKG was personally reviewed and demonstrates:  sinus brady w/ HR 39 Telemetry:  Telemetry was personally reviewed and demonstrates:  SR, Sinus brady  Relevant CV Studies:  ECHO: 02/12/2020  1. Left ventricular ejection fraction, by estimation, is 60 to 65%. The  left ventricle has normal function. The left ventricle has no regional  wall motion abnormalities. Left ventricular diastolic parameters are  consistent with Grade II diastolic dysfunction (pseudonormalization).   2. Left atrial size was mildly dilated.   3. The mitral valve is normal in structure and function. No evidence of mitral valve regurgitation. No evidence of mitral stenosis.   4. The aortic valve is tricuspid. Aortic valve regurgitation is mild.  Mild to moderate aortic valve sclerosis/calcification is present, without any evidence of aortic stenosis.   5. Tricuspid regurgitation signal is inadequate for assessing PA  pressure.   6. The inferior vena cava is normal in size with greater than 50%  respiratory variability, suggesting right atrial pressure of 3 mmHg.   7. Technically difficult study with poor acoustic windows.   MYOVIEW: 01/10/2019 Nuclear stress EF: 62%. The study is normal. This is a low risk study. There was no ST segment deviation noted during stress. No T wave inversion was noted during stress.   Low risk stress nuclear study with normal perfusion and normal left ventricular regional and global systolic function.  Laboratory Data:  High Sensitivity Troponin:   Recent Labs  Lab 11/29/21 1116 11/29/21 1338  TROPONINIHS 6 6     Chemistry Recent Labs  Lab 11/29/21 1116  NA 136  K 5.6*  CL 102  CO2 26  GLUCOSE 124*  BUN 49*  CREATININE 1.92*  CALCIUM 9.4  MG 2.3  GFRNONAA 25*  ANIONGAP 8    Recent Labs  Lab 11/29/21 1116  PROT 6.1*  ALBUMIN 3.2*  AST 32  ALT 21  ALKPHOS 67  BILITOT 0.8   Lipids No results for input(s): CHOL, TRIG, HDL, LABVLDL,  LDLCALC, CHOLHDL in the last 168 hours.  Hematology Recent Labs  Lab 11/29/21 1116  WBC 5.7  RBC 3.82*  HGB 11.9*  HCT 38.0  MCV 99.5  MCH 31.2  MCHC 31.3  RDW 14.5  PLT 203   Thyroid No results for input(s): TSH, FREET4 in the last 168 hours.  BNPNo results for input(s): BNP, PROBNP in the last 168 hours.  DDimer No results for input(s): DDIMER in the last 168 hours.   Radiology/Studies:  DG Chest Portable 1 View  Result Date: 11/29/2021 CLINICAL DATA:  Bradycardia EXAM: PORTABLE CHEST 1 VIEW COMPARISON:  02/11/2020 FINDINGS: Heart size is mildly enlarged. Aortic atherosclerosis. Slightly low lung volumes with probable left basilar atelectasis. No pleural effusion or pneumothorax. Chronic deformity of the left humeral neck. IMPRESSION: Mild cardiomegaly. Slightly low lung volumes with probable left basilar atelectasis. Electronically Signed   By: Davina Poke D.O.   On: 11/29/2021 11:42     Assessment and Plan:   Presyncope -This has been happening in the setting of she has taken her medications and then eaten breakfast.  The symptoms would start an hour or so after breakfast. -While she is significantly bradycardic, some of  the symptoms may be related to her blood pressure medications hitting her all at once. - Perhaps any once a day med could be taken at night.  2.  Bradycardia - She has been on atenolol 100 mg daily for a long time without significant bradycardia - Clonidine was previously 0.1 mg twice daily was increased to 0.2 mg twice daily - Discussed with MD, stop the clonidine and the atenolol - watch for rebound tachycardia, we may be able to restart a BB later, and at a low dose  3.  Hypertension: - She says that her blood pressure is chronically very hard to control. - SBP was running over 200 on a regular basis. - Although her blood pressure is improved, it is not yet at goal - with her BB/Clonidine being stopped >> amlodipine 5 mg qd, watch for edema -  will also have to stop the spironolactone and torsemide due to hyperkalemia and renal insufficiency >> hydralazine 50 mg tid  Risk Assessment/Risk Scores:     For questions or updates, please contact [place] Please consult [email or web address] for contact info under   Signed, Rosaria Ferries, PA-C  11/29/2021 3:10 PM As above, patient seen and examined.  Briefly she is an 87 year old female with past medical history of paroxysmal atrial fibrillation, hypertension, hyperlipidemia, chronic diastolic congestive heart failure, moderate aortic insufficiency, chronic stage III kidney disease for evaluation of bradycardia.  Over the past 3 days patient states she has had near "fainting" spells.  They are sudden in onset and lasts for 1 to 2 seconds and resolve.  She has not had frank syncope.  She otherwise denies dyspnea, chest pain, palpitations or worsening edema.  She was seen at her facility and her heart rate was checked and was noted to be in the 30s.  She was sent to the emergency room for further evaluation. Electrocardiogram shows marked sinus bradycardia with a heart rate of 39, first-degree AV block and prior anterior infarct cannot be excluded.  SEM 5.6, creatinine 1.92, troponin is normal, hemoglobin 11.9.  1 bradycardia-patient presents with near syncopal episodes and is noted to be bradycardic by ECG.  She was noted to have pauses on telemetry in the emergency room with 1 measuring 4.7 seconds.  Will discontinue atenolol and clonidine.  Follow on telemetry.  I think she will likely not require pacing once these medications washed out of her system.  2 hypertension-patient's blood pressure is elevated and I am also discontinuing atenolol and clonidine due to bradycardia.  Add hydralazine 50 mg 3 times daily and amlodipine 5 mg daily.  Follow blood pressure and advance medications as needed.  3 hyperkalemia-discontinue spironolactone.  Hold lisinopril and recheck potassium tomorrow.  Will  resume lisinopril if potassium improved.  4 chronic stage III kidney disease-follow renal function while in house.  5 chronic diastolic congestive heart failure-continue Demadex at present dose.  She does not appear to be volume overloaded on examination.  6 paroxysmal atrial fibrillation-we will hold flecainide for now and resume tomorrow morning.  Continue apixaban.  Kirk Ruths, MD

## 2021-11-29 NOTE — ED Notes (Signed)
Help get patient on the monitor undress into a gown did ekg shown to er provder

## 2021-11-29 NOTE — ED Triage Notes (Signed)
Pt arrived via [facility] with c/c of bradycardia from nursing home. Pt was working with PT when she become brady. Recent adjustment of BP meds. Complaints of dizziness when standing.  30-40HR,  190/100, 100% RA

## 2021-11-30 ENCOUNTER — Other Ambulatory Visit: Payer: Self-pay | Admitting: Physician Assistant

## 2021-11-30 ENCOUNTER — Other Ambulatory Visit (HOSPITAL_COMMUNITY): Payer: Self-pay

## 2021-11-30 DIAGNOSIS — R001 Bradycardia, unspecified: Secondary | ICD-10-CM | POA: Diagnosis not present

## 2021-11-30 DIAGNOSIS — I5032 Chronic diastolic (congestive) heart failure: Secondary | ICD-10-CM

## 2021-11-30 DIAGNOSIS — Z20822 Contact with and (suspected) exposure to covid-19: Secondary | ICD-10-CM | POA: Diagnosis not present

## 2021-11-30 DIAGNOSIS — N183 Chronic kidney disease, stage 3 unspecified: Secondary | ICD-10-CM | POA: Diagnosis not present

## 2021-11-30 LAB — COMPREHENSIVE METABOLIC PANEL
ALT: 22 U/L (ref 0–44)
AST: 26 U/L (ref 15–41)
Albumin: 3.2 g/dL — ABNORMAL LOW (ref 3.5–5.0)
Alkaline Phosphatase: 74 U/L (ref 38–126)
Anion gap: 10 (ref 5–15)
BUN: 43 mg/dL — ABNORMAL HIGH (ref 8–23)
CO2: 24 mmol/L (ref 22–32)
Calcium: 9.7 mg/dL (ref 8.9–10.3)
Chloride: 104 mmol/L (ref 98–111)
Creatinine, Ser: 1.92 mg/dL — ABNORMAL HIGH (ref 0.44–1.00)
GFR, Estimated: 25 mL/min — ABNORMAL LOW (ref >60)
Glucose, Bld: 86 mg/dL (ref 70–99)
Potassium: 5.3 mmol/L — ABNORMAL HIGH (ref 3.5–5.1)
Sodium: 138 mmol/L (ref 135–145)
Total Bilirubin: 1.1 mg/dL (ref 0.3–1.2)
Total Protein: 6.2 g/dL — ABNORMAL LOW (ref 6.5–8.1)

## 2021-11-30 LAB — MRSA NEXT GEN BY PCR, NASAL: MRSA by PCR Next Gen: NOT DETECTED

## 2021-11-30 LAB — SARS CORONAVIRUS 2 BY RT PCR (HOSPITAL ORDER, PERFORMED IN ~~LOC~~ HOSPITAL LAB): SARS Coronavirus 2: NEGATIVE

## 2021-11-30 MED ORDER — AMLODIPINE BESYLATE 5 MG PO TABS
5.0000 mg | ORAL_TABLET | Freq: Every day | ORAL | 6 refills | Status: DC
  Filled 2021-11-30: qty 30, 30d supply, fill #0

## 2021-11-30 MED ORDER — HYDRALAZINE HCL 50 MG PO TABS
50.0000 mg | ORAL_TABLET | Freq: Three times a day (TID) | ORAL | 6 refills | Status: DC
  Filled 2021-11-30: qty 90, 30d supply, fill #0

## 2021-11-30 NOTE — Discharge Instructions (Signed)
WEIGH DAILY, every am, wearing the same amount of clothing Record weights, and bring it to office appointments Contact Fransico Him, MD for weight gain of 3 lbs in a day or 5 lbs in a week Limit sodium to 500 mg per meal, total 2000 mg per day Limit all liquids to 1.5-2 liters/quarts per day

## 2021-11-30 NOTE — Progress Notes (Signed)
Progress Note  Patient Name: Christina Morgan Date of Encounter: 11/30/2021  [facility] Cardiologist: Fransico Him, MD   Subjective   No CP or dyspnea; no presyncope  Inpatient Medications    Scheduled Meds:  amLODipine  5 mg Oral Daily   apixaban  2.5 mg Oral BID   ascorbic acid  500 mg Oral Daily   atorvastatin  10 mg Oral QHS   cholecalciferol  125 mcg Oral Daily   hydrALAZINE  50 mg Oral Q8H   letrozole  2.5 mg Oral Daily   multivitamin with minerals  1 tablet Oral Daily   pantoprazole  40 mg Oral Daily   sodium chloride flush  3 mL Intravenous Q12H   [START ON 12/01/2021] torsemide  10 mg Oral Once per day on Mon Wed Fri   [START ON 12/01/2021] vitamin B-12  250 mcg Oral QODAY   Continuous Infusions:  sodium chloride     PRN Meds: sodium chloride, acetaminophen, albuterol, ALPRAZolam, nitroGLYCERIN, ondansetron (ZOFRAN) IV, sodium chloride flush, zolpidem   Vital Signs    Vitals:   11/29/21 2324 11/30/21 0419 11/30/21 0520 11/30/21 0627  BP: (!) 173/58 (!) 156/54 (!) 118/56 (!) 156/64  Pulse: (!) 57 (!) 56 (!) 55 (!) 58  Resp: 18 16 13 17   Temp: (!) 97.5 F (36.4 C) 97.6 F (36.4 C)    TempSrc: Oral Oral    SpO2: 97% 97% 97% 97%  Weight:  74 kg    Height:        Intake/Output Summary (Last 24 hours) at 11/30/2021 0739 Last data filed at 11/30/2021 0600 Gross per 24 hour  Intake 60 ml  Output 1200 ml  Net -1140 ml   Last 3 Weights 11/30/2021 11/29/2021 11/29/2021  Weight (lbs) 163 lb 2.3 oz 163 lb 174 lb 1.6 oz  Weight (kg) 74 kg 73.936 kg 78.971 kg      Telemetry    Sinus to sinus bradycardia; HR 60 this AM - Personally Reviewed   Physical Exam   GEN: No acute distress.   Neck: No JVD Cardiac: RRR Respiratory: Clear to auscultation bilaterally. GI: Soft, nontender, non-distended  MS: No edema Neuro:  Nonfocal  Psych: Normal affect   Labs    High Sensitivity Troponin:   Recent Labs  Lab 11/29/21 1116 11/29/21 1338   TROPONINIHS 6 6     Chemistry Recent Labs  Lab 11/29/21 1116 11/30/21 0227  NA 136 138  K 5.6* 5.3*  CL 102 104  CO2 26 24  GLUCOSE 124* 86  BUN 49* 43*  CREATININE 1.92* 1.92*  CALCIUM 9.4 9.7  MG 2.3  --   PROT 6.1* 6.2*  ALBUMIN 3.2* 3.2*  AST 32 26  ALT 21 22  ALKPHOS 67 74  BILITOT 0.8 1.1  GFRNONAA 25* 25*  ANIONGAP 8 10     Hematology Recent Labs  Lab 11/29/21 1116  WBC 5.7  RBC 3.82*  HGB 11.9*  HCT 38.0  MCV 99.5  MCH 31.2  MCHC 31.3  RDW 14.5  PLT 203   Thyroid  Recent Labs  Lab 11/29/21 1813  TSH 3.135      Radiology    DG Chest Portable 1 View  Result Date: 11/29/2021 CLINICAL DATA:  Bradycardia EXAM: PORTABLE CHEST 1 VIEW COMPARISON:  02/11/2020 FINDINGS: Heart size is mildly enlarged. Aortic atherosclerosis. Slightly low lung volumes with probable left basilar atelectasis. No pleural effusion or pneumothorax. Chronic deformity of the left humeral neck. IMPRESSION: Mild cardiomegaly.  Slightly low lung volumes with probable left basilar atelectasis. Electronically Signed   By: Davina Poke D.O.   On: 11/29/2021 11:42      Patient Profile     87 year old female with past medical history of paroxysmal atrial fibrillation, hypertension, hyperlipidemia, chronic diastolic congestive heart failure, moderate aortic insufficiency, chronic stage III kidney disease for evaluation of bradycardia.  Over the past 3 days patient states she has had near "fainting" spells.  She has not had frank syncope.  She was seen at her facility and her heart rate was checked and was noted to be in the 30s.  She was sent to the emergency room for further evaluation.   Assessment & Plan    1 bradycardia-heart rate has improved after discontinuing atenolol and clonidine.  She is in sinus rhythm with a heart rate of 60 this morning.  We will avoid AV nodal blocking agents as she did have symptomatic bradycardia at time of admission with documented pauses.     2  hypertension-spironolactone, atenolol and clonidine were discontinued due to bradycardia and hyperkalemia.  Hydralazine and amlodipine were added.  We will continue at present dose and follow blood pressure; advance as needed.  Watch for rebound hypertension given discontinuation of beta-blockade and clonidine.   3 hyperkalemia-spironolactone discontinued.  We will continue off of lisinopril for now but could be resumed as an outpatient.  Potassium with some improvement this morning.  We will recheck in 1 week.   4 chronic stage III kidney disease-recheck in 1 week.   5 chronic diastolic congestive heart failure-she remains euvolemic on examination.  Continue Demadex at present dose.  However we will continue off of spironolactone.   6 paroxysmal atrial fibrillation-continue apixaban.  Resume flecainide at preadmission dose at discharge.  Will watch heart rate and blood pressure this morning.  If stable discharge later today and follow-up with APP or Dr. Radford Pax in 1 week.  Medication adjustments as outlined above. Greater than 30 minutes PA and physician time. D2  For questions or updates, please contact [place] Please consult [email or web address] for contact info under        Signed, Kirk Ruths, MD  11/30/2021, 7:39 AM

## 2021-11-30 NOTE — Plan of Care (Signed)

## 2021-11-30 NOTE — Discharge Summary (Signed)
Discharge Summary    Patient ID: Christina Morgan MRN: [id number]; DOB: [birth year]  Admit date: 11/29/2021 Discharge date: 11/30/2021  PCP:  Orvis Brill, [place] Prov[facility] Cardiologist:  Fransico Him, MD        Discharge Diagnoses    Principal Problem:   Symptomatic bradycardia   Diagnostic Studies/Procedures    None this admission _____________   History of Present Illness     Christina Morgan is a 87 y.o. female with  a hx of PAF on Eliquis and flecainide, HTN, HLD, LE edema, D-CHF w/ mod AI, nl MV 2015, osteopenia, CKD III, who was admitted 11/29/2021 for symptomatic bradycardia.  Hospital Course     Consultants: None   Prior to admission, she had been on atenolol 100 mg a day and clonidine.  The clonidine had recently been increased from 0.1 up to 0.2 mg twice daily.  She has a history of poor blood pressure control.  Her heart rate was in the 30s at times, sinus bradycardia with a first-degree AV block.  Both clonidine and the atenolol were held.  Her heart rate gradually improved.  She has a history of PAF and was continued on flecainide and apixaban.  Because clonidine metoprolol were stopped, amlodipine was added for blood pressure control.  Her potassium was elevated, so the lisinopril 10 mg daily was held.  Her potassium improved slightly.  Her BUN/creatinine were on the high side of her normal, she does have a history of renal insufficiency.  The spironolactone and torsemide were held because of this and she was started on hydralazine, initially at 50 mg 3 times daily.  Her creatinine stayed the same, but her BUN improved a little off the spironolactone and torsemide.  Recheck in 1 week.  The torsemide will be resumed at discharge to manage her volume.  On 12/20, she was seen by Dr. Stanford Breed and all data were reviewed.  Final determination of her discharge meds was made.  Her heart rate was in the 60s, sinus rhythm and her SBP  was 130s-140s on the current regimen.  No further inpatient work-up is indicated and she is considered stable for discharge, to follow-up as an outpatient.   Discharge Vitals Blood pressure (!) 144/50, pulse 65, temperature 98 F (36.7 C), temperature source Oral, resp. rate 20, height 5\' 6"  (1.676 m), weight 74 kg, SpO2 97 %.  Filed Weights   11/29/21 1115 11/29/21 1825 11/30/21 0419  Weight: 79 kg 73.9 kg 74 kg    Labs & Radiologic Studies    CBC Recent Labs    11/29/21 1116  WBC 5.7  NEUTROABS 3.7  HGB 11.9*  HCT 38.0  MCV 99.5  PLT 409   Basic Metabolic Panel Recent Labs    11/29/21 1116 11/30/21 0227  NA 136 138  K 5.6* 5.3*  CL 102 104  CO2 26 24  GLUCOSE 124* 86  BUN 49* 43*  CREATININE 1.92* 1.92*  CALCIUM 9.4 9.7  MG 2.3  --    Liver Function Tests Recent Labs    11/29/21 1116 11/30/21 0227  AST 32 26  ALT 21 22  ALKPHOS 67 74  BILITOT 0.8 1.1  PROT 6.1* 6.2*  ALBUMIN 3.2* 3.2*   No results for input(s): LIPASE, AMYLASE in the last 72 hours. High Sensitivity Troponin:   Recent Labs  Lab 11/29/21 1116 11/29/21 1338  TROPONINIHS 6 6    BNP Invalid input(s): POCBNP D-Dimer No results for input(s): DDIMER in  the last 72 hours. Hemoglobin A1C No results for input(s): HGBA1C in the last 72 hours. Fasting Lipid Panel No results for input(s): CHOL, HDL, LDLCALC, TRIG, CHOLHDL, LDLDIRECT in the last 72 hours. Thyroid Function Tests Recent Labs    11/29/21 1813  TSH 3.135   _____________  DG Chest Portable 1 View  Result Date: 11/29/2021 CLINICAL DATA:  Bradycardia EXAM: PORTABLE CHEST 1 VIEW COMPARISON:  02/11/2020 FINDINGS: Heart size is mildly enlarged. Aortic atherosclerosis. Slightly low lung volumes with probable left basilar atelectasis. No pleural effusion or pneumothorax. Chronic deformity of the left humeral neck. IMPRESSION: Mild cardiomegaly. Slightly low lung volumes with probable left basilar atelectasis. Electronically  Signed   By: Davina Poke D.O.   On: 11/29/2021 11:42   Disposition   Pt is being discharged home today in good condition.  Follow-up Plans & Appointments     Follow-up Information     [facility] Follow up on 12/07/2021.   Specialty: Cardiology Why: Come by the office for lab work.  You do not need a specific appointment.  Okay to take all of your morning medications.  You do not have to be fasting. Contact information: [address] [place]        Loel Dubonnet, NP Follow up on 12/21/2021.   Specialty: Cardiology Why: Please arrive at 8:15 AM for an 8:25 AM appointment. This will be at the [facility] office. Contact information: [place] [address] [phone]                Discharge Instructions     (HEART FAILURE PATIENTS) Call MD:  Anytime you have any of the following symptoms: 1) 3 pound weight gain in 24 hours or 5 pounds in 1 week 2) shortness of breath, with or without a dry hacking cough 3) swelling in the hands, feet or stomach 4) if you have to sleep on extra pillows at night in order to breathe.   Complete by: As directed    Diet - low sodium heart healthy   Complete by: As directed    Increase activity slowly   Complete by: As directed        Discharge Medications   Allergies as of 11/30/2021       Reactions   Penicillins Itching, Other (See Comments)   Dizziness to the patient of passing out- told by MD to "never take this again" Did it involve swelling of the face/tongue/throat, SOB, or low BP? No Did it involve sudden or severe rash/hives, skin peeling, or any reaction on the inside of your mouth or nose? No Did you need to seek medical attention at a hospital or doctor's office? No When did it last happen? "a long time ago"    If all above answers are "NO", may proceed with cephalosporin use.        Medication List     STOP  taking these medications    atenolol 100 MG tablet Commonly known as: TENORMIN   cloNIDine 0.2 MG tablet Commonly known as: CATAPRES   lisinopril 10 MG tablet Commonly known as: ZESTRIL   spironolactone 25 MG tablet Commonly known as: ALDACTONE       TAKE these medications    acetaminophen 500 MG tablet Commonly known as: TYLENOL Take 1,000 mg by mouth 3 (three) times daily as needed for moderate pain or mild pain.   albuterol 108 (90 Base) MCG/ACT inhaler Commonly known  as: VENTOLIN HFA Inhale 2 puffs into the lungs every 4 (four) hours as needed for wheezing or shortness of breath (please try to use ALbuterol inhaler PRIOR to Nebs).   amLODipine 5 MG tablet Commonly known as: NORVASC Take 1 tablet (5 mg total) by mouth daily. Start taking on: December 01, 2021   apixaban 2.5 MG Tabs tablet Commonly known as: ELIQUIS Take 1 tablet (2.5 mg total) by mouth 2 (two) times daily.   ascorbic acid 500 MG tablet Commonly known as: VITAMIN C Take 1 tablet (500 mg total) by mouth daily.   atorvastatin 10 MG tablet Commonly known as: LIPITOR Take 1 tablet (10 mg total) by mouth at bedtime.   DAILY VITE PO Take 1 tablet by mouth daily.   flecainide 100 MG tablet Commonly known as: TAMBOCOR Take 1 tablet (100 mg total) by mouth 2 (two) times daily.   hydrALAZINE 50 MG tablet Commonly known as: APRESOLINE Take 1 tablet (50 mg total) by mouth every 8 (eight) hours.   letrozole 2.5 MG tablet Commonly known as: FEMARA Take 2.5 mg by mouth daily.   nystatin powder Generic drug: nystatin Apply 1 g topically See admin instructions. Spread topically to area under bilateral breasts & groin twice a day at 8 AM and 8 PM   omeprazole 20 MG capsule Commonly known as: PRILOSEC Take 20 mg by mouth daily.   torsemide 10 MG tablet Commonly known as: DEMADEX Take 10 mg by mouth 3 (three) times a week. Mon. Wed. Fri.   vitamin B-12 250 MCG tablet Commonly known as:  CYANOCOBALAMIN Take 250 mcg by mouth every other day.   Vitamin D 125 MCG (5000 UT) Caps Take 125 mcg by mouth daily.           Outstanding Labs/Studies   BMET in 1 week.   Duration of Discharge Encounter   Greater than 30 minutes including physician time.  Signed, Rosaria Ferries, PA-C 11/30/2021, 12:38 PM

## 2021-11-30 NOTE — TOC Transition Note (Signed)
Transition of Care [facility]) - CM/SW Discharge Note   Patient Details  Name: Christina Morgan MRN: [id number] Date of Birth: 1933/06/19  Transition of [facility]) CM/SW Contact:  Milas Gain, [place] Phone Number: 11/30/2021, 3:06 PM   Clinical Narrative:     CSW spoke with patient and patients daughter Pranathi at bedside. Patient agreeable to return to [place]. Patient confirmed her son Gwyndolyn Saxon will transport her back to ALF.  Patient [facility] to[facility]  Anticipated DC date: 11/30/2021  Family notified: Benjamine Mola and Gwyndolyn Saxon  Transport by: Patients son Gwyndolyn Saxon   ?  Per MD patient ready for DC to [facility] . RN, patient, patient's family, and facility notified of DC. Discharge [place], and Covid result sent to facility. RN given number for report tele# [phone] RM# [phone]. DC packet on chart. Ambulance transport requested for patient.  CSW signing off.   Final next level of care: Assisted Living [facility]) Barriers to Discharge: No Barriers Identified   Patient Goals and CMS Choice Patient states their goals for this hospitalization and ongoing recovery are:: ALF CMS Medicare.gov Compare Post Acute Care list provided to:: Patient Choice offered to / list presented to : Patient  Discharge Placement              Patient chooses bed at: [facility] (ALF) Patient to be transferred to facility by: Patients son Gwyndolyn Saxon will transport Name of family member notified: Benjamine Mola and Gwyndolyn Saxon Patient and family notified of of transfer: 11/30/21  Discharge Plan and Services In-house Referral: Clinical Social Work                                   Social Determinants of Health ([place]) Interventions     Readmission Risk Interventions No flowsheet data found.

## 2021-11-30 NOTE — Progress Notes (Signed)
Report given to [place].

## 2021-11-30 NOTE — NC FL2 (Addendum)
[place] LEVEL OF CARE SCREENING TOOL     IDENTIFICATION  Patient Name: Christina Morgan Birthdate: 1933/01/11 Sex: female Admission Date (Current Location): 11/29/2021  [facility] and [place] Number:  Herbalist and Address:  [facility] [place], [place] [address], [place], [place] [address]      Provider Number: [id number]  Attending Physician Name and Address:  Lelon Perla, MD  Relative Name and Phone Number:  [place] [phone]    Current Level of Care: Hospital Recommended Level of Care: [place] ([place] [facility]) Prior Approval Number:    Date Approved/Denied:   PASRR Number:    Discharge Plan: Other (Comment) (ALF [facility])    Current Diagnoses: Patient Active Problem List   Diagnosis Date Noted   Symptomatic bradycardia 11/29/2021   Bacteremia due to Gram-positive bacteria 02/13/2020   Acute encephalopathy 02/11/2020   Cervical spine fracture ([place]) 02/11/2020   CAP (community acquired pneumonia) 02/11/2020   Chronic diastolic congestive heart failure ([place])    Stage 3 chronic kidney disease ([place])    PAF (paroxysmal atrial fibrillation) ([place])    Closed displaced intertrochanteric fracture of right femur ([place])    Fall 09/23/2019   SOB (shortness of breath) 01/04/2019   Bilateral lower leg cellulitis 03/30/2018   Chronic diastolic heart failure ([place]) 03/30/2018   Edema of extremities 08/19/2014   Essential hypertension, benign 05/15/2014   Mixed hyperlipidemia 05/15/2014   Persistent atrial fibrillation ([place]) 05/15/2014   Endometrial ca ([place]) 12/28/2012    Orientation RESPIRATION BLADDER Height & Weight     Self, Time, Situation, Place  Normal Continent, External catheter (External Urinary Catheter) Weight: 163 lb 2.3 oz (74 kg) Height:  5\' 6"  (167.6 cm)  BEHAVIORAL SYMPTOMS/MOOD NEUROLOGICAL BOWEL NUTRITION STATUS      Continent (WDL) Diet (No added salt)  AMBULATORY STATUS  COMMUNICATION OF NEEDS Skin     Verbally Other (Comment) (Appropriate for ethnicity,dry,intact,ecchymosis,arm,leg,bilateral)                       Personal Care Assistance Level of Assistance  Bathing, Feeding, Dressing Bathing Assistance: Maximum assistance Feeding assistance: Limited assistance (Needs assist sitting up) Dressing Assistance: Maximum assistance     Functional Limitations Info  Sight, Hearing, Speech Sight Info: Impaired Hearing Info: Adequate Speech Info: Adequate    SPECIAL CARE FACTORS FREQUENCY  PT (By licensed PT), OT (By licensed OT)     PT Frequency: 3x min weekly OT Frequency: 3x min weekly            Contractures Contractures Info: Not present    Additional Factors Info  Code Status, Allergies Code Status Info: FULL Allergies Info: Penicillins           TAKE these medications     acetaminophen 500 MG tablet Commonly known as: TYLENOL Take 1,000 mg by mouth 3 (three) times daily as needed for moderate pain or mild pain.    albuterol 108 (90 Base) MCG/ACT inhaler Commonly known as: VENTOLIN HFA Inhale 2 puffs into the lungs every 4 (four) hours as needed for wheezing or shortness of breath (please try to use ALbuterol inhaler PRIOR to Nebs).    amLODipine 5 MG tablet Commonly known as: NORVASC Take 1 tablet (5 mg total) by mouth daily. Start taking on: December 01, 2021    apixaban 2.5 MG Tabs tablet Commonly known as: ELIQUIS Take 1 tablet (2.5 mg total) by mouth 2 (two) times daily.  ascorbic acid 500 MG tablet Commonly known as: VITAMIN C Take 1 tablet (500 mg total) by mouth daily.    atorvastatin 10 MG tablet Commonly known as: LIPITOR Take 1 tablet (10 mg total) by mouth at bedtime.    DAILY VITE PO Take 1 tablet by mouth daily.    flecainide 100 MG tablet Commonly known as: TAMBOCOR Take 1 tablet (100 mg total) by mouth 2 (two) times daily.    hydrALAZINE 50 MG tablet Commonly known as: APRESOLINE Take 1  tablet (50 mg total) by mouth every 8 (eight) hours.    letrozole 2.5 MG tablet Commonly known as: FEMARA Take 2.5 mg by mouth daily.    nystatin powder Generic drug: nystatin Apply 1 g topically See admin instructions. Spread topically to area under bilateral breasts & groin twice a day at 8 AM and 8 PM    omeprazole 20 MG capsule Commonly known as: PRILOSEC Take 20 mg by mouth daily.    torsemide 10 MG tablet Commonly known as: DEMADEX Take 10 mg by mouth 3 (three) times a week. Mon. Wed. Fri.    vitamin B-12 250 MCG tablet Commonly known as: CYANOCOBALAMIN Take 250 mcg by mouth every other day.    Vitamin D 125 MCG (5000 UT) Caps Take 125 mcg by mouth daily.            Relevant Imaging Results:  Relevant Lab Results:   Additional Information [id number]  Milas Gain, LCSWA

## 2021-12-07 ENCOUNTER — Other Ambulatory Visit: Payer: Self-pay

## 2021-12-07 ENCOUNTER — Other Ambulatory Visit: Payer: Medicare Other | Admitting: *Deleted

## 2021-12-07 DIAGNOSIS — I5032 Chronic diastolic (congestive) heart failure: Secondary | ICD-10-CM

## 2021-12-07 LAB — BASIC METABOLIC PANEL
BUN/Creatinine Ratio: 26 (ref 12–28)
BUN: 46 mg/dL — ABNORMAL HIGH (ref 8–27)
CO2: 24 mmol/L (ref 20–29)
Calcium: 9.9 mg/dL (ref 8.7–10.3)
Chloride: 102 mmol/L (ref 96–106)
Creatinine, Ser: 1.74 mg/dL — ABNORMAL HIGH (ref 0.57–1.00)
Glucose: 110 mg/dL — ABNORMAL HIGH (ref 70–99)
Potassium: 5.1 mmol/L (ref 3.5–5.2)
Sodium: 140 mmol/L (ref 134–144)
eGFR: 28 mL/min/{1.73_m2} — ABNORMAL LOW (ref >59)

## 2021-12-08 ENCOUNTER — Encounter: Payer: Self-pay | Admitting: Physician Assistant

## 2021-12-15 ENCOUNTER — Emergency Department (HOSPITAL_COMMUNITY)
Admission: EM | Admit: 2021-12-15 | Discharge: 2021-12-16 | Disposition: A | Discharge status: Skilled Nursing Facility | Payer: Medicare Other | Attending: Emergency Medicine | Admitting: Emergency Medicine

## 2021-12-15 ENCOUNTER — Other Ambulatory Visit: Payer: Self-pay

## 2021-12-15 ENCOUNTER — Emergency Department (HOSPITAL_COMMUNITY): Payer: Medicare Other

## 2021-12-15 DIAGNOSIS — Z79899 Other long term (current) drug therapy: Secondary | ICD-10-CM | POA: Insufficient documentation

## 2021-12-15 DIAGNOSIS — Z7901 Long term (current) use of anticoagulants: Secondary | ICD-10-CM | POA: Diagnosis not present

## 2021-12-15 DIAGNOSIS — I4891 Unspecified atrial fibrillation: Secondary | ICD-10-CM | POA: Insufficient documentation

## 2021-12-15 DIAGNOSIS — R000 Tachycardia, unspecified: Secondary | ICD-10-CM | POA: Diagnosis present

## 2021-12-15 DIAGNOSIS — I480 Paroxysmal atrial fibrillation: Secondary | ICD-10-CM | POA: Diagnosis not present

## 2021-12-15 LAB — CBC WITH DIFFERENTIAL/PLATELET
Abs Immature Granulocytes: 0.04 10*3/uL (ref 0.00–0.07)
Basophils Absolute: 0 10*3/uL (ref 0.0–0.1)
Basophils Relative: 0 %
Eosinophils Absolute: 0 10*3/uL (ref 0.0–0.5)
Eosinophils Relative: 0 %
HCT: 35.6 % — ABNORMAL LOW (ref 36.0–46.0)
Hemoglobin: 11.3 g/dL — ABNORMAL LOW (ref 12.0–15.0)
Immature Granulocytes: 1 %
Lymphocytes Relative: 14 %
Lymphs Abs: 0.7 10*3/uL (ref 0.7–4.0)
MCH: 31 pg (ref 26.0–34.0)
MCHC: 31.7 g/dL (ref 30.0–36.0)
MCV: 97.8 fL (ref 80.0–100.0)
Monocytes Absolute: 0.5 10*3/uL (ref 0.1–1.0)
Monocytes Relative: 11 %
Neutro Abs: 3.3 10*3/uL (ref 1.7–7.7)
Neutrophils Relative %: 74 %
Platelets: 277 10*3/uL (ref 150–400)
RBC: 3.64 MIL/uL — ABNORMAL LOW (ref 3.87–5.11)
RDW: 14.7 % (ref 11.5–15.5)
WBC: 4.5 10*3/uL (ref 4.0–10.5)
nRBC: 0 % (ref 0.0–0.2)

## 2021-12-15 LAB — COMPREHENSIVE METABOLIC PANEL
ALT: 25 U/L (ref 0–44)
AST: 26 U/L (ref 15–41)
Albumin: 3.1 g/dL — ABNORMAL LOW (ref 3.5–5.0)
Alkaline Phosphatase: 99 U/L (ref 38–126)
Anion gap: 11 (ref 5–15)
BUN: 37 mg/dL — ABNORMAL HIGH (ref 8–23)
CO2: 22 mmol/L (ref 22–32)
Calcium: 9.1 mg/dL (ref 8.9–10.3)
Chloride: 101 mmol/L (ref 98–111)
Creatinine, Ser: 1.89 mg/dL — ABNORMAL HIGH (ref 0.44–1.00)
GFR, Estimated: 26 mL/min — ABNORMAL LOW (ref >60)
Glucose, Bld: 113 mg/dL — ABNORMAL HIGH (ref 70–99)
Potassium: 4.3 mmol/L (ref 3.5–5.1)
Sodium: 134 mmol/L — ABNORMAL LOW (ref 135–145)
Total Bilirubin: 1 mg/dL (ref 0.3–1.2)
Total Protein: 6.1 g/dL — ABNORMAL LOW (ref 6.5–8.1)

## 2021-12-15 NOTE — ED Triage Notes (Signed)
Pt here w/ [facility] from [facility] for tachycardia at facility. They checked her pulse 3 times, all in 120s. Per EMS highest HR was 115. Pt tested positive for COVID 5 days ago, has had cough and runny nose. Pt has no complaints. 164/80, 115HR, 97% RA.

## 2021-12-15 NOTE — ED Provider Triage Note (Signed)
Emergency Medicine Provider Triage Evaluation Note  Christina Morgan , a 88 y.o. female  was evaluated in triage.  Patient from [facility] for tachycardia.  Recently tested for COVID-19 5 days ago.  Has been complaining of some cough and congestion since that time.  States that she has not had fevers.  Her heart rate was checked today and it was as high as 120s more than 3 times prompting EMS call.  With EMS it was in the 115's and here it is 108.  Patient denies any chest pain, shortness of breath, palpitations.  She denies any vomiting or diarrhea.  Otherwise has no complaints.   Review of Systems  Positive: + tachycardia, cough, congestion Negative: - chest pain, SOB, fevers  Physical Exam  BP 122/74 (BP Location: Left Arm)    Pulse (!) 108    Temp 97.6 F (36.4 C) (Oral)    Resp 19    SpO2 97%  Gen:   Awake, no distress   Resp:  Normal effort. LCTAB.  MSK:   Moves extremities without difficulty  Other:  Tachy with HR 108.   Medical Decision Making  Medically screening exam initiated at 10:16 PM.  Appropriate orders placed.  [place] PAONE was informed that the remainder of the evaluation will be completed by another provider, this initial triage assessment does not replace that evaluation, and the importance of remaining in the ED until their evaluation is complete.     [name], PA-C 12/15/21 Wallace, [place], DO 12/15/21 2243

## 2021-12-16 DIAGNOSIS — I480 Paroxysmal atrial fibrillation: Secondary | ICD-10-CM

## 2021-12-16 DIAGNOSIS — I4891 Unspecified atrial fibrillation: Secondary | ICD-10-CM | POA: Diagnosis not present

## 2021-12-16 LAB — URINALYSIS, ROUTINE W REFLEX MICROSCOPIC
Bilirubin Urine: NEGATIVE
Glucose, UA: NEGATIVE mg/dL
Ketones, ur: 5 mg/dL — AB
Nitrite: NEGATIVE
Protein, ur: 100 mg/dL — AB
RBC / HPF: >50 RBC/hpf — ABNORMAL HIGH (ref 0–5)
Specific Gravity, Urine: 1.016 (ref 1.005–1.030)
pH: 5 (ref 5.0–8.0)

## 2021-12-16 LAB — TROPONIN I (HIGH SENSITIVITY)
Troponin I (High Sensitivity): 11 ng/L
Troponin I (High Sensitivity): 12 ng/L

## 2021-12-16 MED ORDER — ETOMIDATE 2 MG/ML IV SOLN: 3.7000 mg | Freq: Once | INTRAVENOUS | Status: DC

## 2021-12-16 MED ORDER — CEFDINIR 300 MG PO CAPS: 300.0000 mg | ORAL_CAPSULE | Freq: Two times a day (BID) | ORAL | 0 refills | Status: DC

## 2021-12-16 MED ORDER — METOPROLOL TARTRATE 5 MG/5ML IV SOLN
2.5000 mg | Freq: Once | INTRAVENOUS | Status: AC
  Administered 2021-12-16: 2.5 mg via INTRAVENOUS
  Filled 2021-12-16: qty 5

## 2021-12-16 MED ORDER — CEFTRIAXONE SODIUM 1 G IJ SOLR
1.0000 g | Freq: Once | INTRAMUSCULAR | Status: AC
  Administered 2021-12-16: 1 g via INTRAVENOUS
  Filled 2021-12-16: qty 10

## 2021-12-16 MED ORDER — ETOMIDATE 2 MG/ML IV SOLN
7.4000 mg | Freq: Once | INTRAVENOUS | Status: AC
Start: 2021-12-16 — End: 2021-12-16
  Administered 2021-12-16: 7.4 mg via INTRAVENOUS
  Filled 2021-12-16: qty 10

## 2021-12-16 MED ORDER — METOPROLOL SUCCINATE ER 25 MG PO TB24: 25.0000 mg | ORAL_TABLET | Freq: Every day | ORAL | 0 refills | Status: DC

## 2021-12-16 MED ORDER — LEVOFLOXACIN 500 MG PO TABS: 500.0000 mg | ORAL_TABLET | Freq: Once | ORAL | Status: DC

## 2021-12-16 NOTE — Discharge Instructions (Signed)
You have an appointment set up with the [facility].  Multiple studies have shown that being followed by a dedicated atrial fibrillation clinic in addition to the standard care you receive from your other physicians improves health. We believe that enrollment in the atrial fibrillation clinic will allow [place] to better care for you.   The phone number to the [facility] is [phone]. The clinic is staffed Monday through Friday from 8:30am to 5pm.  Parking Directions: The clinic is located in the Heart and Vascular Building connected to [facility]. 1)From [address] turn on to [address] and go to the 3rd entrance  (Heart and Vascular entrance) on the right. 2)Look to the right for Heart &Vascular Parking Garage. 3)A code for the entrance is required, for January is 1202   4)Take the elevators to the 1st floor. Registration is in the room with the glass walls at the end of the hallway.  If you have any trouble parking or locating the clinic, please dont hesitate to call [phone].

## 2021-12-16 NOTE — Consult Note (Addendum)
Cardiology Consultation:   Patient ID: [name] MRN: [id number]; DOB: 21-Oct-1933  Admit date: 12/15/2021 Date of Consult: 12/16/2021  PCP:  Orvis Brill, [place] [facility] Cardiologist:  Fransico Him, MD      Patient Profile:   Christina Morgan is a 88 y.o. female with a hx of HTN, HLD, chronic CHF (diastolic), CKD (III), Uterine Cancer, and Afib who is being seen 12/16/2021 for the evaluation of Afib w/RVR at the request of Dr. Gilford Raid.  History of Present Illness:   Christina Morgan last saw Dr. Radford Pax May 2022, she was doing well, noted for chronic edema that was stable, wearing compression hose during the days, compliant with medicines.  She had been apparently referred to nephrology for rising Creat and K+  She was hospitalized here Dec 19-20/2022 for symptomatic bradycardia noting SR 30's with a few days of transient lightheaded spells some though lasting 10 minutes, no syncope. Her home atenolol 100mg  daily, clonidine 0.2mg  BID were held, her flecainide continued Her HR improved and om discharge  Atenolol and clonidine stopped As well as lisinopril and aldactone 2/2 hyperkalemia Amlodipine, hydralazine, torsemide  COVID + on 12/11/21  She is back today via EMS from her living facility where her HRs were noted when doing vitals to be elevated, though apparently without symptoms Found in AFib with rates 110's-120, stable BP  LABS K+ 4.3  37/1.89 HS Trop 11 WBC 4.5 H/H 11/35 Plts 277  UA abn c/w UTI  Daughter is at bedside The patient reports feeling "fine", denies any urinary symptoms.  Getting over Covid, her daughter reports today is her 6th day and off restrictions.  She hd some congestion and a slight cough, no fever and resolving nicely. She denies any cardiac awareness, no palpitations, no CP She has edema, this is only slightly worse then baseline, of late has not been as good as usual about wearing her compression  stockings   Past Medical History:  Diagnosis Date   Cancer ([place])    endometrial ca   Chronic diastolic CHF (congestive heart failure) (HCC)    Chronic edema    CKD (chronic kidney disease), stage III ([place])    Dyslipidemia    Fracture of left humerus    History of cardiovascular stress test    Lexiscan Myoview (06/2014): No ischemia or scar, not gated, low risk   Hx of echocardiogram    Echo (05/02/14): EF 60% to 65%. No regional wall motion abnormalities. Mild AI, mildly dilated aortic root (37 mm), trivial MR, trivial TR   Hyperlipidemia    Hypertension    Lipoma of skin    multiple   Osteopenia    PAF (paroxysmal atrial fibrillation) (HCC)    failed DCCV 05/2014 >> Flecainide started >> DCCV 7/15 sucessful;  f/u ETT neg for pro-arrhythmia >> recurrent AF/AFL >> Flecainide inc to 100 bid with repeat DCCV 9/15   Vitamin D deficiency 09/25/2019    Past Surgical History:  Procedure Laterality Date   CARDIOVERSION N/A 06/05/2014   Procedure: CARDIOVERSION;  Surgeon: Sueanne Margarita, MD;  Location: [facility];  Service: Cardiovascular;  Laterality: N/A;   CARDIOVERSION N/A 07/11/2014   Procedure: CARDIOVERSION;  Surgeon: Sueanne Margarita, MD;  Location: [facility];  Service: Cardiovascular;  Laterality: N/A;   CARDIOVERSION N/A 09/04/2014   Procedure: CARDIOVERSION;  Surgeon: Sueanne Margarita, MD;  Location: [facility];  Service: Cardiovascular;  Laterality: N/A;   COLONOSCOPY  2008   COLONOSCOPY N/A 05/20/2015  Procedure: COLONOSCOPY;  Surgeon: Ronald Lobo, MD;  Location: [facility];  Service: Endoscopy;  Laterality: N/A;   FEMUR IM NAIL Right 09/24/2019   Procedure: INTRAMEDULLARY (IM) NAIL FEMORAL;  Surgeon: Altamese Argonne, MD;  Location: [facility];  Service: Orthopedics;  Laterality: Right;   IR KYPHO EA ADDL LEVEL THORACIC OR LUMBAR  04/12/2019   IR KYPHO THORACIC WITH BONE BIOPSY  04/12/2019   ROBOTIC ASSISTED SUPRACERVICAL HYSTERECTOMY WITH BILATERAL SALPINGO OOPHERECTOMY   11/06/2012   and bilateral pelvic lymph node dissection     Home Medications:  Prior to Admission medications   Medication Sig Start Date End Date Taking? Authorizing Provider  acetaminophen (TYLENOL) 500 MG tablet Take 1,000 mg by mouth 3 (three) times daily as needed for moderate pain or mild pain.   Yes [provider]  albuterol (VENTOLIN HFA) 108 (90 Base) MCG/ACT inhaler Inhale 2 puffs into the lungs every 4 (four) hours as needed for wheezing or shortness of breath (please try to use ALbuterol inhaler PRIOR to Nebs). Patient taking differently: Inhale 2 puffs into the lungs every 4 (four) hours as needed for wheezing or shortness of breath. 10/11/19  Yes Angiulli, Lavon Paganini, PA-C  amLODipine (NORVASC) 5 MG tablet Take 1 tablet (5 mg total) by mouth daily. 12/01/21  Yes Barrett, Evelene Croon, PA-C  apixaban (ELIQUIS) 2.5 MG TABS tablet Take 1 tablet (2.5 mg total) by mouth 2 (two) times daily. 04/26/21  Yes Turner, Eber Hong, MD  atorvastatin (LIPITOR) 10 MG tablet Take 1 tablet (10 mg total) by mouth at bedtime. 04/26/21  Yes Turner, Eber Hong, MD  Cholecalciferol (VITAMIN D) 125 MCG (5000 UT) CAPS Take 125 mcg by mouth daily.   Yes [provider]  flecainide (TAMBOCOR) 100 MG tablet Take 1 tablet (100 mg total) by mouth 2 (two) times daily. 04/26/21  Yes Turner, Eber Hong, MD  hydrALAZINE (APRESOLINE) 50 MG tablet Take 1 tablet (50 mg total) by mouth every 8 (eight) hours. 11/30/21  Yes Barrett, Evelene Croon, PA-C  letrozole (FEMARA) 2.5 MG tablet Take 2.5 mg by mouth daily.   Yes [provider]  Multiple Vitamin (DAILY VITE PO) Take 1 tablet by mouth daily.   Yes [provider]  NYSTATIN powder Apply 1 g topically See admin instructions. Spread topically to area under bilateral breasts & groin twice a day at 8 AM and 8 PM 01/29/20  Yes [provider]  omeprazole (PRILOSEC) 20 MG capsule Take 20 mg by mouth daily. 04/14/21  Yes [provider]   torsemide (DEMADEX) 10 MG tablet Take 10 mg by mouth every Monday, Wednesday, and Friday. 11/15/21  Yes [provider]  vitamin B-12 (CYANOCOBALAMIN) 250 MCG tablet Take 250 mcg by mouth every other day.   Yes [provider]  vitamin C (VITAMIN C) 500 MG tablet Take 1 tablet (500 mg total) by mouth daily. 09/28/19  Yes Antonieta Pert, MD    Inpatient Medications: Scheduled Meds:  Continuous Infusions:  PRN Meds:   Allergies:    Allergies  Allergen Reactions   Penicillins Itching and Other (See Comments)    Dizziness to the patient of passing out- told by MD to "never take this again" Did it involve swelling of the face/tongue/throat, SOB, or low BP? No Did it involve sudden or severe rash/hives, skin peeling, or any reaction on the inside of your mouth or nose? No Did you need to seek medical attention at a hospital or doctor's office? No When did it  last happen? "a long time ago"    If all above answers are "NO", may proceed with cephalosporin use.     Social History:   Social History   Socioeconomic History   Marital status: Widowed    Spouse name: Not on file   Number of children: Not on file   Years of education: Not on file   Highest education level: Not on file  Occupational History   Not on file  Tobacco Use   Smoking status: Never   Smokeless tobacco: Never  Vaping Use   Vaping Use: Never used  Substance and Sexual Activity   Alcohol use: No   Drug use: No   Sexual activity: Not on file  Other Topics Concern   Not on file  Social History Narrative   ** Merged History Encounter **       Social Determinants of Health   Financial Resource Strain: Not on file  Food Insecurity: Not on file  Transportation Needs: Not on file  Physical Activity: Not on file  Stress: Not on file  Social Connections: Not on file  Intimate Partner Violence: Not on file    Family History:   Family History  Problem Relation Age of Onset   Cancer Father 81        metastatic oropharyngeal ca   Heart attack Mother    Coronary artery disease Mother      ROS:  Please see the history of present illness.  All other ROS reviewed and negative.     Physical Exam/Data:   Vitals:   12/16/21 0615 12/16/21 0853 12/16/21 1215 12/16/21 1245  BP: 102/69 133/86 (!) 160/86 138/88  Pulse: (!) 124 (!) 120 (!) 134 (!) 122  Resp: 18 16 20 17   Temp: 97.9 F (36.6 C) 98.2 F (36.8 C)    TempSrc: Oral Oral    SpO2: 96% 97% 96% 96%   No intake or output data in the 24 hours ending 12/16/21 1347 Last 3 Weights 11/30/2021 11/29/2021 11/29/2021  Weight (lbs) 163 lb 2.3 oz 163 lb 174 lb 1.6 oz  Weight (kg) 74 kg 73.936 kg 78.971 kg     There is no height or weight on file to calculate BMI.  General:  Well nourished, well developed, in no acute distress HEENT: normal Neck: no JVD Vascular: No carotid bruits Cardiac:  irreg-irreg: 1-2/6 SM, no gallops or rubs Lungs:  slightly diminished at the bases, no crackles, no wheezing, rhonchi or rales  Abd: soft, nontender, no hepatomegaly  Ext: 1-2+ edema R>L chronically Musculoskeletal:  No deformities, age appropriate atrophy Skin: warm and dry  Neuro:  no focal abnormalities noted Psych:  Normal affect   EKG:  The EKG was personally reviewed and demonstrates:    AFib 110bpm, IVC 149ms  OLD 11/30/21: SR 60bpm, QRS 116  Telemetry:  Telemetry was personally reviewed and demonstrates:   AFib 110's-120's  Relevant CV Studies:  02/12/2020: TTE IMPRESSIONS   1. Left ventricular ejection fraction, by estimation, is 60 to 65%. The  left ventricle has normal function. The left ventricle has no regional  wall motion abnormalities. Left ventricular diastolic parameters are  consistent with Grade II diastolic  dysfunction (pseudonormalization).   2. Left atrial size was mildly dilated.   3. The mitral valve is normal in structure and function. No evidence of  mitral valve regurgitation. No evidence of mitral  stenosis.   4. The aortic valve is tricuspid. Aortic valve regurgitation is mild.  Mild to  moderate aortic valve sclerosis/calcification is present, without  any evidence of aortic stenosis.   5. Tricuspid regurgitation signal is inadequate for assessing PA  pressure.   6. The inferior vena cava is normal in size with greater than 50%  respiratory variability, suggesting right atrial pressure of 3 mmHg.   7. Technically difficult study with poor acoustic windows.    01/10/2019: stress myoview Nuclear stress EF: 62%. The study is normal. This is a low risk study. There was no ST segment deviation noted during stress. No T wave inversion was noted during stress. Low risk stress nuclear study with normal perfusion and normal left ventricular regional and global systolic function.   Laboratory Data:  High Sensitivity Troponin:   Recent Labs  Lab 11/29/21 1116 11/29/21 1338 12/16/21 1245  TROPONINIHS 6 6 11      Chemistry Recent Labs  Lab 12/15/21 2223  NA 134*  K 4.3  CL 101  CO2 22  GLUCOSE 113*  BUN 37*  CREATININE 1.89*  CALCIUM 9.1  GFRNONAA 26*  ANIONGAP 11    Recent Labs  Lab 12/15/21 2223  PROT 6.1*  ALBUMIN 3.1*  AST 26  ALT 25  ALKPHOS 99  BILITOT 1.0   Lipids No results for input(s): CHOL, TRIG, HDL, LABVLDL, LDLCALC, CHOLHDL in the last 168 hours.  Hematology Recent Labs  Lab 12/15/21 2223  WBC 4.5  RBC 3.64*  HGB 11.3*  HCT 35.6*  MCV 97.8  MCH 31.0  MCHC 31.7  RDW 14.7  PLT 277   Thyroid No results for input(s): TSH, FREET4 in the last 168 hours.  BNPNo results for input(s): BNP, PROBNP in the last 168 hours.  DDimer No results for input(s): DDIMER in the last 168 hours.   Radiology/Studies:  DG Chest 2 View Result Date: 12/15/2021 CLINICAL DATA:  Cough. EXAM: CHEST - 2 VIEW COMPARISON:  Chest radiograph dated 11/29/2021. FINDINGS: No focal consolidation, pleural effusion, pneumothorax. Mild diffuse chronic interstitial coarsening.  Faint nodularity over the left lower lung field may be chronic. Atypical pneumonia is not excluded. Stable cardiomediastinal silhouette. Atherosclerotic calcification of the aorta. Osteopenia with degenerative changes of the spine and lower thoracic vertebroplasty. No acute osseous pathology. IMPRESSION: 1. No acute cardiopulmonary process. 2. Faint nodularity over the left lower lung field may be chronic. Atypical pneumonia is not excluded. Clinical correlation is recommended. Electronically Signed   By: Anner Crete M.D.   On: 12/15/2021 22:56     Assessment and Plan:   Paroxysmal Afib CHA2DS2Vasc is 4, on Eliquis appropriately low dose for age/Creat  Taken off atenolol (high dose) and clonidine recently for bradycardia I think she could tolerate single agent though would not use either of those in this elderly female  She is on flecainide without a nodal blocking agent, she has no hx of CAD and a neg stress test 3 years ago  Chronic CHF (diastolic) Recently meds adjusted 2/2 hyperkalemia She has edema, not far from her baseline of years  HTN Plenty of room for low dose BB   I Yeriel Mineo give her a small dose of IV lopressor to start  I don't think she Christina Morgan need to be admitted from cardiac perspective with no symptoms, and stable BP MD Floyce Bujak see for final recommendations  Dr. Curt Bears has seen and examined the patient The patient's medicines are given to her by ALF staff without missed doses including last evening prior to coming in Afib likely provoked by recent COVID illness and UTI Recommends DCCV in  the ER Home with Toprol XL 25mg  daily  F/u in the AFib clinic is arranged    Risk Assessment/Risk Scores:   For questions or updates, please contact Standard Please consult [email or web address] for contact info under    Signed, Baldwin Jamaica, PA-C  12/16/2021 1:47 PM  I have seen and examined this patient with Tommye Standard.  Agree with above, note added to reflect my  findings.  Patient presented to the hospital after being found to be in atrial fibrillation at her SNF.  Patient is minimally symptomatic.  She overall feels well.  GEN: Well nourished, well developed, in no acute distress  HEENT: normal  Neck: no JVD, carotid bruits, or masses Cardiac: Tachycardic, irregular; no murmurs, rubs, or gallops,no edema  Respiratory:  clear to auscultation bilaterally, normal work of breathing GI: soft, nontender, nondistended, + BS MS: no deformity or atrophy  Skin: warm and dry Neuro:  Strength and sensation are intact Psych: euthymic mood, full affect   Paroxysmal atrial fibrillation: Patient is feeling well but presented to the emergency room in atrial fibrillation.  She has no chest pain or shortness of breath.  We Adelynne Joerger plan for cardioversion in the emergency room.  We Jojo Pehl continue her current dose of flecainide.  She did have COVID and a UTI which could have caused her atrial fibrillation.  We Anh Bigos call her in Toprol-XL 25 mg.  She did have significant bradycardia on 100 mg of atenolol, but this low-dose of beta-blocker should be reasonable.  We Evanna Washinton have her follow-up in cardiology clinic.  Jeffie Widdowson M. Jacqueli Pangallo MD 12/16/2021 4:03 PM

## 2021-12-16 NOTE — ED Notes (Signed)
[facility] called to transport patient  

## 2021-12-16 NOTE — ED Provider Notes (Signed)
[facility] EMERGENCY DEPARTMENT Provider Note   CSN: [id number] Arrival date & time: 12/15/21  2208     History  Chief Complaint  Patient presents with   Tachycardia    Christina Morgan is a 88 y.o. female.  Pt presents to the ED today with elevated HR.  Pt has a hx of afib and is on Eliquis.  Pt was admitted from 12/19 to 12/20 for symptomatic bradycardia with HRs down to the 30s.  Pt's atenolol and clonidine were held and HR went back up to the 60s.  Pt has been compliant with her Eliquis.  She also takes flecainide for rate control and has been compliant with that.  Pt did test + for Covid on 12/31.  She has had a little cough, but otherwise feels ok.  Pt was not aware her HR was elevated.  It's been getting checked at [facility].      Home Medications Prior to Admission medications   Medication Sig Start Date End Date Taking? Authorizing Provider  acetaminophen (TYLENOL) 500 MG tablet Take 1,000 mg by mouth 3 (three) times daily as needed for moderate pain or mild pain.   Yes [provider]  albuterol (VENTOLIN HFA) 108 (90 Base) MCG/ACT inhaler Inhale 2 puffs into the lungs every 4 (four) hours as needed for wheezing or shortness of breath (please try to use ALbuterol inhaler PRIOR to Nebs). Patient taking differently: Inhale 2 puffs into the lungs every 4 (four) hours as needed for wheezing or shortness of breath. 10/11/19  Yes Angiulli, Lavon Paganini, PA-C  amLODipine (NORVASC) 5 MG tablet Take 1 tablet (5 mg total) by mouth daily. 12/01/21  Yes Barrett, Evelene Croon, PA-C  apixaban (ELIQUIS) 2.5 MG TABS tablet Take 1 tablet (2.5 mg total) by mouth 2 (two) times daily. 04/26/21  Yes Turner, Eber Hong, MD  atorvastatin (LIPITOR) 10 MG tablet Take 1 tablet (10 mg total) by mouth at bedtime. 04/26/21  Yes Turner, Eber Hong, MD  Cholecalciferol (VITAMIN D) 125 MCG (5000 UT) CAPS Take 125 mcg by mouth daily.   Yes [provider]  flecainide (TAMBOCOR)  100 MG tablet Take 1 tablet (100 mg total) by mouth 2 (two) times daily. 04/26/21  Yes Turner, Eber Hong, MD  hydrALAZINE (APRESOLINE) 50 MG tablet Take 1 tablet (50 mg total) by mouth every 8 (eight) hours. 11/30/21  Yes Barrett, Evelene Croon, PA-C  letrozole (FEMARA) 2.5 MG tablet Take 2.5 mg by mouth daily.   Yes [provider]  Multiple Vitamin (DAILY VITE PO) Take 1 tablet by mouth daily.   Yes [provider]  NYSTATIN powder Apply 1 g topically See admin instructions. Spread topically to area under bilateral breasts & groin twice a day at 8 AM and 8 PM 01/29/20  Yes [provider]  omeprazole (PRILOSEC) 20 MG capsule Take 20 mg by mouth daily. 04/14/21  Yes [provider]  torsemide (DEMADEX) 10 MG tablet Take 10 mg by mouth every Monday, Wednesday, and Friday. 11/15/21  Yes [provider]  vitamin B-12 (CYANOCOBALAMIN) 250 MCG tablet Take 250 mcg by mouth every other day.   Yes [provider]  vitamin C (VITAMIN C) 500 MG tablet Take 1 tablet (500 mg total) by mouth daily. 09/28/19  Yes Antonieta Pert, MD  cefdinir (OMNICEF) 300 MG capsule Take 1 capsule (300 mg total) by mouth 2 (two) times daily. 12/16/21  Yes Isla Pence, MD  metoprolol succinate (TOPROL-XL) 25 MG 24  hr tablet Take 1 tablet (25 mg total) by mouth daily. 12/16/21  Yes Isla Pence, MD      Allergies    Penicillins    Review of Systems   Review of Systems  All other systems reviewed and are negative.  Physical Exam Updated Vital Signs BP (!) 145/95    Pulse 94    Temp 98.2 F (36.8 C) (Oral)    Resp (!) 21    SpO2 94%  Physical Exam Vitals and nursing note reviewed.  Constitutional:      Appearance: Normal appearance.  HENT:     Head: Normocephalic and atraumatic.     Right Ear: External ear normal.     Left Ear: External ear normal.     Nose: Nose normal.     Mouth/Throat:     Mouth: Mucous membranes are dry.  Eyes:     Extraocular Movements: Extraocular  movements intact.     Conjunctiva/sclera: Conjunctivae normal.     Pupils: Pupils are equal, round, and reactive to light.  Cardiovascular:     Rate and Rhythm: Tachycardia present. Rhythm irregular.     Pulses: Normal pulses.     Heart sounds: Normal heart sounds.  Pulmonary:     Effort: Pulmonary effort is normal.     Breath sounds: Normal breath sounds.  Abdominal:     General: Abdomen is flat. Bowel sounds are normal.     Palpations: Abdomen is soft.  Musculoskeletal:        General: Normal range of motion.     Cervical back: Normal range of motion and neck supple.     Right lower leg: Edema present.     Left lower leg: Edema present.  Skin:    General: Skin is warm.     Capillary Refill: Capillary refill takes less than 2 seconds.  Neurological:     General: No focal deficit present.     Mental Status: She is alert and oriented to person, place, and time.  Psychiatric:        Mood and Affect: Mood normal.        Behavior: Behavior normal.    ED Results / Procedures / Treatments   Labs (all labs ordered are listed, but only abnormal results are displayed) Labs Reviewed  COMPREHENSIVE METABOLIC PANEL - Abnormal; Notable for the following components:      Result Value   Sodium 134 (*)    Glucose, Bld 113 (*)    BUN 37 (*)    Creatinine, Ser 1.89 (*)    Total Protein 6.1 (*)    Albumin 3.1 (*)    GFR, Estimated 26 (*)    All other components within normal limits  CBC WITH DIFFERENTIAL/PLATELET - Abnormal; Notable for the following components:   RBC 3.64 (*)    Hemoglobin 11.3 (*)    HCT 35.6 (*)    All other components within normal limits  URINALYSIS, ROUTINE W REFLEX MICROSCOPIC - Abnormal; Notable for the following components:   Color, Urine AMBER (*)    APPearance CLOUDY (*)    Hgb urine dipstick LARGE (*)    Ketones, ur 5 (*)    Protein, ur 100 (*)    Leukocytes,Ua SMALL (*)    RBC / HPF >50 (*)    Bacteria, UA FEW (*)    All other components within  normal limits  URINE CULTURE  TROPONIN I (HIGH SENSITIVITY)  TROPONIN I (HIGH SENSITIVITY)    EKG EKG Interpretation  Date/Time:  Wednesday December 15 2021 22:15:53 EST Ventricular Rate:  110 PR Interval:    QRS Duration: 120 QT Interval:  382 QTC Calculation: 516 R Axis:   129 Text Interpretation: Atrial fibrillation with rapid ventricular response Left posterior fascicular block Anterior infarct , age undetermined Abnormal ECG When compared with ECG of 30-Nov-2021 04:55, PREVIOUS ECG IS PRESENT new from prior, but hx afib Confirmed by Isla Pence [phone]) on 12/16/2021 12:29:18 PM  Radiology DG Chest 2 View  Result Date: 12/15/2021 CLINICAL DATA:  Cough. EXAM: CHEST - 2 VIEW COMPARISON:  Chest radiograph dated 11/29/2021. FINDINGS: No focal consolidation, pleural effusion, pneumothorax. Mild diffuse chronic interstitial coarsening. Faint nodularity over the left lower lung field may be chronic. Atypical pneumonia is not excluded. Stable cardiomediastinal silhouette. Atherosclerotic calcification of the aorta. Osteopenia with degenerative changes of the spine and lower thoracic vertebroplasty. No acute osseous pathology. IMPRESSION: 1. No acute cardiopulmonary process. 2. Faint nodularity over the left lower lung field may be chronic. Atypical pneumonia is not excluded. Clinical correlation is recommended. Electronically Signed   By: Anner Crete M.D.   On: 12/15/2021 22:56    Procedures Procedures    Medications Ordered in ED Medications  cefTRIAXone (ROCEPHIN) 1 g in sodium chloride 0.9 % 100 mL IVPB (0 g Intravenous Stopped 12/16/21 1507)  metoprolol tartrate (LOPRESSOR) injection 2.5 mg (2.5 mg Intravenous Given 12/16/21 1448)    ED Course/ Medical Decision Making/ A&P                           Medical Decision Making  Pt is in afib with RVR.  Due to her having symptomatic bradycardia with HR in the 30s, I spoke with cardiology.  They will come and evaluate and  advise.  CXR shows a possible LLL infiltrate.  This could be due to the recent Covid infection that she's had or it could be bacterial.  She is not hypoxic or sob.   Urine shows hematuria with + LE and 21-50 wbcs.  This could be due to a UTI.  She is started on rocephin which will cover both uti and pna.  Pt will be d/c with omnicef.  Urine will be sent for culture.  Pt d/w cardiology.  They recommend cardioversion.  Pt will be signed out to Dr. Pearline Cables at shift change and she will do the cardioversion.    Pt will be d/c with a printed prescription for toprol xl 25 mg daily (she was on 100) and omnicef.  She is to f/u with cards.  Return if worse.        Final Clinical Impression(s) / ED Diagnoses Final diagnoses:  Atrial fibrillation with rapid ventricular response ([place])    Rx / DC Orders ED Discharge Orders          Ordered    metoprolol succinate (TOPROL-XL) 25 MG 24 hr tablet  Daily        12/16/21 1610    cefdinir (OMNICEF) 300 MG capsule  2 times daily        12/16/21 1610              Isla Pence, MD 12/16/21 [phone]

## 2021-12-16 NOTE — ED Provider Notes (Signed)
4:56 PM 88 yo female signed out to me by previous physician. A. Fib with RVR. Seen by cardiology with recommendations to cardioversion and DC.    Physical Exam  BP 136/73    Pulse 83    Temp 98.2 F (36.8 C) (Oral)    Resp 20    SpO2 98%   Physical Exam Vitals and nursing note reviewed.  Constitutional:      Appearance: Normal appearance.  HENT:     Head: Normocephalic and atraumatic.     Nose: Nose normal.     Mouth/Throat:     Mouth: Mucous membranes are moist.     Pharynx: Oropharynx is clear.  Eyes:     Pupils: Pupils are equal, round, and reactive to light.  Cardiovascular:     Rate and Rhythm: Rhythm irregular.  Pulmonary:     Effort: Pulmonary effort is normal.  Skin:    General: Skin is warm.     Capillary Refill: Capillary refill takes less than 2 seconds.  Neurological:     Mental Status: She is alert and oriented to person, place, and time.    Procedures  .Cardioversion  Date/Time: 12/16/2021 4:55 PM Performed by: Lianne Cure, DO Authorized by: Lianne Cure, DO   Consent:    Consent obtained:  Verbal   Consent given by:  Patient   Alternatives discussed:  Anti-coagulation medication Pre-procedure details:    Cardioversion basis:  Elective   Rhythm:  Atrial fibrillation Attempt one:    Shock (Joules):  150   Shock outcome:  Conversion to normal sinus rhythm Post-procedure details:    Patient status:  Awake   Patient tolerance of procedure:  Tolerated well, no immediate complications  ED Course / MDM    Medical Decision Making  4:56 PM Patient on home Eliquis. Risks vs benefits of cardioversion discussed with patient in detail. Etomidate used for sedation. Patient cardioverted successfully without acute complications. 12 lead ECG confirms nsr. Patient alert and able to follow commands shortly after.  Cardiology recs Toprol XL 25 mg daily. Prescription provided.  Patient in no distress and overall condition improved here in the ED. Detailed  discussions were had with the patient regarding current findings, and need for close f/u with PCP or on call doctor. The patient has been instructed to return immediately if the symptoms worsen in any way for re-evaluation. Patient verbalized understanding and is in agreement with current care plan. All questions answered prior to discharge.        Campbell Stall P, DO [id number]

## 2021-12-16 NOTE — ED Notes (Signed)
Patient alert and oriented, daughter remains in lobby with patient.

## 2021-12-16 NOTE — Sedation Documentation (Signed)
Shock delivered by Dr. Pearline Cables with conversion to NSR

## 2021-12-17 LAB — URINE CULTURE: Culture: <10000 — AB

## 2021-12-21 ENCOUNTER — Ambulatory Visit (HOSPITAL_BASED_OUTPATIENT_CLINIC_OR_DEPARTMENT_OTHER): Payer: Medicare Other | Admitting: Family

## 2021-12-24 ENCOUNTER — Other Ambulatory Visit: Payer: Self-pay

## 2021-12-24 ENCOUNTER — Ambulatory Visit (INDEPENDENT_AMBULATORY_CARE_PROVIDER_SITE_OTHER): Payer: Medicare Other | Admitting: Family

## 2021-12-24 ENCOUNTER — Encounter (HOSPITAL_BASED_OUTPATIENT_CLINIC_OR_DEPARTMENT_OTHER): Payer: Self-pay | Admitting: Family

## 2021-12-24 VITALS — BP 140/54 | HR 61 | Ht 66.0 in | Wt 176.7 lb

## 2021-12-24 DIAGNOSIS — I5032 Chronic diastolic (congestive) heart failure: Secondary | ICD-10-CM

## 2021-12-24 DIAGNOSIS — I480 Paroxysmal atrial fibrillation: Secondary | ICD-10-CM | POA: Diagnosis not present

## 2021-12-24 DIAGNOSIS — D6859 Other primary thrombophilia: Secondary | ICD-10-CM | POA: Diagnosis not present

## 2021-12-24 DIAGNOSIS — R600 Localized edema: Secondary | ICD-10-CM

## 2021-12-24 NOTE — Progress Notes (Signed)
Office Visit    Patient Name: Christina Morgan Date of Encounter: 12/24/2021  PCP:  Orvis Brill, [facility]  Cardiologist:  Fransico Him, MD  Advanced Practice Provider:  No care team member to display Electrophysiologist:  None      Chief Complaint    Christina Morgan is a 88 y.o. female with a hx of paroxysmal atrial fibrillation on Eliquis and flecainide, hypertension, hyperlipidemia, lower extremity edema, diastolic heart failure with moderate aortic regurgitation, osteopenia, CKD 3, symptomatic bradycardia presents today for hospital follow-up  Past Medical History    Past Medical History:  Diagnosis Date   Cancer ([place])    endometrial ca   Chronic diastolic CHF (congestive heart failure) ([place])    Chronic edema    CKD (chronic kidney disease), stage III ([place])    Dyslipidemia    Fracture of left humerus    History of cardiovascular stress test    Lexiscan Myoview (06/2014): No ischemia or scar, not gated, low risk   Hx of echocardiogram    Echo (05/02/14): EF 60% to 65%. No regional wall motion abnormalities. Mild AI, mildly dilated aortic root (37 mm), trivial MR, trivial TR   Hyperlipidemia    Hypertension    Lipoma of skin    multiple   Osteopenia    PAF (paroxysmal atrial fibrillation) ([facility])    failed DCCV 05/2014 >> Flecainide started >> DCCV 7/15 sucessful;  f/u ETT neg for pro-arrhythmia >> recurrent AF/AFL >> Flecainide inc to 100 bid with repeat DCCV 9/15   Vitamin D deficiency 09/25/2019   Past Surgical History:  Procedure Laterality Date   CARDIOVERSION N/A 06/05/2014   Procedure: CARDIOVERSION;  Surgeon: Sueanne Margarita, MD;  Location: [facility];  Service: Cardiovascular;  Laterality: N/A;   CARDIOVERSION N/A 07/11/2014   Procedure: CARDIOVERSION;  Surgeon: Sueanne Margarita, MD;  Location: [facility];  Service: Cardiovascular;  Laterality: N/A;   CARDIOVERSION N/A 09/04/2014   Procedure: CARDIOVERSION;   Surgeon: Sueanne Margarita, MD;  Location: [facility];  Service: Cardiovascular;  Laterality: N/A;   COLONOSCOPY  2008   COLONOSCOPY N/A 05/20/2015   Procedure: COLONOSCOPY;  Surgeon: Ronald Lobo, MD;  Location: [facility];  Service: Endoscopy;  Laterality: N/A;   FEMUR IM NAIL Right 09/24/2019   Procedure: INTRAMEDULLARY (IM) NAIL FEMORAL;  Surgeon: Altamese Plainview, MD;  Location: [facility];  Service: Orthopedics;  Laterality: Right;   IR KYPHO EA ADDL LEVEL THORACIC OR LUMBAR  04/12/2019   IR KYPHO THORACIC WITH BONE BIOPSY  04/12/2019   ROBOTIC ASSISTED SUPRACERVICAL HYSTERECTOMY WITH BILATERAL SALPINGO OOPHERECTOMY  11/06/2012   and bilateral pelvic lymph node dissection    Allergies  Allergies  Allergen Reactions   Penicillins Itching and Other (See Comments)    Dizziness to the patient of passing out- told by MD to "never take this again" Did it involve swelling of the face/tongue/throat, SOB, or low BP? No Did it involve sudden or severe rash/hives, skin peeling, or any reaction on the inside of your mouth or nose? No Did you need to seek medical attention at a hospital or doctor's office? No When did it last happen? "a long time ago"    If all above answers are "NO", may proceed with cephalosporin use.     History of Present Illness    Christina Morgan is a 88 y.o. female with a hx of paroxysmal atrial fibrillation on Eliquis and flecainide, hypertension, hyperlipidemia, lower extremity edema,  diastolic heart failure with moderate aortic regurgitation, osteopenia, CKD 3, symptomatic bradycardia last seen while hospitalized.  She was admitted 1219/22 due to bradycardia.  Atenolol and clonidine were both discontinued.  She had heart rate in the 30s at times as well sinus bradycardia with first-degree AV block.  Amlodipine was added for blood pressure control. She tested positive for COVID-19 12/11/2021. She was seen in the ED 12/15/2021 and underwent cardioversion.  Today for  follow-up with her daughter.  Notes that she just finished antibiotics for suspected UTI and has had some diarrhea.  Encouraged probiotic use or yogurt.  Reports no chest pain, pressure, tightness.  Reports her exertional dyspnea is improving since discharge.  She does have a home health nurse who does not qualify for therapy at this time per her report.  She is hopeful to start doing a little bit of exercise.  She has not had any palpitations.  Heart rate when checked by nursing facility routinely in the 60s.  Blood pressure most often in the 130s over 80s.   EKGs/Labs/Other Studies Reviewed:   The following studies were reviewed today:  Echo 02/12/20   1. Left ventricular ejection fraction, by estimation, is 60 to 65%. The  left ventricle has normal function. The left ventricle has no regional  wall motion abnormalities. Left ventricular diastolic parameters are  consistent with Grade II diastolic  dysfunction (pseudonormalization).   2. Left atrial size was mildly dilated.   3. The mitral valve is normal in structure and function. No evidence of  mitral valve regurgitation. No evidence of mitral stenosis.   4. The aortic valve is tricuspid. Aortic valve regurgitation is mild.  Mild to moderate aortic valve sclerosis/calcification is present, without  any evidence of aortic stenosis.   5. Tricuspid regurgitation signal is inadequate for assessing PA  pressure.   6. The inferior vena cava is normal in size with greater than 50%  respiratory variability, suggesting right atrial pressure of 3 mmHg.   7. Technically difficult study with poor acoustic windows.   EKG:  EKG is ordered today.  The ekg ordered today demonstrates NSR 61 bpm with nonspecific IVCD and no acute ST/T wave changes.  Recent Labs: 11/29/2021: Magnesium 2.3; TSH 3.135 12/15/2021: ALT 25; BUN 37; Creatinine, Ser 1.89; Hemoglobin 11.3; Platelets 277; Potassium 4.3; Sodium 134  Recent Lipid Panel    Component Value  Date/Time   CHOL 175 03/21/2016 0915   TRIG 155 (H) 03/21/2016 0915   HDL 63 03/21/2016 0915   CHOLHDL 2.8 03/21/2016 0915   VLDL 31 (H) 03/21/2016 0915   LDLCALC 81 03/21/2016 0915    Risk Assessment/Calculations:   CHA2DS2-VASc Score = 5   This indicates a 7.2% annual risk of stroke. The patient's score is based upon: CHF History: 1 HTN History: 1 Diabetes History: 0 Stroke History: 0 Vascular Disease History: 0 Age Score: 2 Gender Score: 1   Home Medications   Current Meds  Medication Sig   acetaminophen (TYLENOL) 500 MG tablet Take 1,000 mg by mouth 3 (three) times daily as needed for moderate pain or mild pain.   albuterol (VENTOLIN HFA) 108 (90 Base) MCG/ACT inhaler Inhale 2 puffs into the lungs every 4 (four) hours as needed for wheezing or shortness of breath (please try to use ALbuterol inhaler PRIOR to [place]). (Patient taking differently: Inhale 2 puffs into the lungs every 4 (four) hours as needed for wheezing or shortness of breath.)   amLODipine (NORVASC) 5 MG tablet Take 1 tablet (  5 mg total) by mouth daily.   apixaban (ELIQUIS) 2.5 MG TABS tablet Take 1 tablet (2.5 mg total) by mouth 2 (two) times daily.   atorvastatin (LIPITOR) 10 MG tablet Take 1 tablet (10 mg total) by mouth at bedtime.   Cholecalciferol (VITAMIN D) 125 MCG (5000 UT) CAPS Take 125 mcg by mouth daily.   flecainide (TAMBOCOR) 100 MG tablet Take 1 tablet (100 mg total) by mouth 2 (two) times daily.   hydrALAZINE (APRESOLINE) 50 MG tablet Take 1 tablet (50 mg total) by mouth every 8 (eight) hours.   letrozole (FEMARA) 2.5 MG tablet Take 2.5 mg by mouth daily.   metoprolol succinate (TOPROL-XL) 25 MG 24 hr tablet Take 1 tablet (25 mg total) by mouth daily.   Multiple Vitamin (DAILY VITE PO) Take 1 tablet by mouth daily.   NYSTATIN powder Apply 1 g topically See admin instructions. Spread topically to area under bilateral breasts & groin twice a day at 8 AM and 8 PM   omeprazole (PRILOSEC) 20 MG  capsule Take 20 mg by mouth daily.   torsemide (DEMADEX) 10 MG tablet Take 10 mg by mouth every Monday, Wednesday, and Friday.   vitamin B-12 (CYANOCOBALAMIN) 250 MCG tablet Take 250 mcg by mouth every other day.   vitamin C (VITAMIN C) 500 MG tablet Take 1 tablet (500 mg total) by mouth daily.     Review of Systems      All other systems reviewed and are otherwise negative except as noted above.  Physical Exam    VS:  BP (!) 140/54    Pulse 61    Ht 5\' 6"  (1.676 m)    Wt 176 lb 11.2 oz (80.2 kg)    SpO2 95%    BMI 28.52 kg/m  , BMI Body mass index is 28.52 kg/m.  Wt Readings from Last 3 Encounters:  12/24/21 176 lb 11.2 oz (80.2 kg)  11/30/21 163 lb 2.3 oz (74 kg)  04/26/21 167 lb 9.6 oz (76 kg)     GEN: Well nourished, well developed, in no acute distress. HEENT: normal. Neck: Supple, no JVD, carotid bruits, or masses. Cardiac: RRR, no murmurs, rubs, or gallops. No clubbing, cyanosis, edema.  Radials/PT 2+ and equal bilaterally.  Respiratory:  Respirations regular and unlabored, clear to auscultation bilaterally. GI: Soft, nontender, nondistended. MS: No deformity or atrophy. Skin: Warm and dry, no rash. Neuro:  Strength and sensation are intact. Psych: Normal affect.  Assessment & Plan    PAF / Chronic anticoagulation -maintaining sinus rhythm by EKG dated.  Continue flecainide and metoprolol.  Continue Eliquis 2.5 mg twice daily.  Reduced dose due to age, renal function. CHA2DS2-VASc Score = 5 [CHF History: 1, HTN History: 1, Diabetes History: 0, Stroke History: 0, Vascular Disease History: 0, Age Score: 2, Gender Score: 1].  Therefore, the patient's annual risk of stroke is 7.2 %.      HLD - Continue Lipitor 10mg  QD.  Denies myalgias  Chronic diastolic heart failure - Euvolemic and well compensated on exam.  Continue torsemide 10 mg 3 times per week.  We will ask her facility to weigh once per week and report weight gain of 5 pounds in 1 week.  She may take additional 10  mg torsemide as needed for weight and 5 pounds in 1 week or lower extremity edema.  She presently has an Haematologist in place.  HTN - BP well controlled. Continue current antihypertensive regimen.     Disposition: Follow up  in 2 month(s) with Fransico Him, MD or APP.  Signed, Loel Dubonnet, NP 12/24/2021, 5:06 PM [place]

## 2021-12-24 NOTE — Patient Instructions (Signed)
Medication Instructions:  Continue your current medications.   *If you need a refill on your cardiac medications before your next appointment, please call your pharmacy*   Lab Work: Your physician recommends that you return for lab work today: BMET, mag, CBC  If you have labs (blood work) drawn today and your tests are completely normal, you will receive your results only by: MyChart Message (if you have MyChart) OR A paper copy in the mail If you have any lab test that is abnormal or we need to change your treatment, we will call you to review the results.   Testing/Procedures: Your EKG today showed normal sinus rhythm.    Follow-Up: At [facility], you and your health needs are our priority.  As part of our continuing mission to provide you with exceptional heart care, we have created designated Provider Care Teams.  These Care Teams include your primary Cardiologist (physician) and Advanced Practice Providers (APPs -  Physician Assistants and Nurse Practitioners) who all work together to provide you with the care you need, when you need it.  We recommend signing up for the patient portal called "MyChart".  Sign up information is provided on this After Visit Summary.  MyChart is used to connect with patients for Virtual Visits (Telemedicine).  Patients are able to view lab/test results, encounter notes, upcoming appointments, etc.  Non-urgent messages can be sent to your provider as well.   To learn more about what you can do with MyChart, go to [email or web address].    Your next appointment:   In March as scheduled with Dr. Radford Pax   Other Instructions  Please weigh weekly and report weight gain of 5 pounds over 1 week.    To prevent or reduce lower extremity swelling: Eat a low salt diet. Salt makes the body hold onto extra fluid which causes swelling. Sit with legs elevated. For example, in the recliner or on an [place].  Wear knee-high compression stockings during the  daytime. Ones labeled 15-20 mmHg provide good compression. You could also do 20-68mmHg Recommend trying compression stockings with a zipper  Weigh weekly

## 2021-12-25 LAB — BASIC METABOLIC PANEL
BUN/Creatinine Ratio: 19 (ref 12–28)
BUN: 31 mg/dL — ABNORMAL HIGH (ref 8–27)
CO2: 23 mmol/L (ref 20–29)
Calcium: 9.3 mg/dL (ref 8.7–10.3)
Chloride: 107 mmol/L — ABNORMAL HIGH (ref 96–106)
Creatinine, Ser: 1.67 mg/dL — ABNORMAL HIGH (ref 0.57–1.00)
Glucose: 109 mg/dL — ABNORMAL HIGH (ref 70–99)
Potassium: 5.1 mmol/L (ref 3.5–5.2)
Sodium: 142 mmol/L (ref 134–144)
eGFR: 30 mL/min/{1.73_m2} — ABNORMAL LOW (ref >59)

## 2021-12-25 LAB — CBC
Hematocrit: 31.4 % — ABNORMAL LOW (ref 34.0–46.6)
Hemoglobin: 10.5 g/dL — ABNORMAL LOW (ref 11.1–15.9)
MCH: 31.3 pg (ref 26.6–33.0)
MCHC: 33.4 g/dL (ref 31.5–35.7)
MCV: 94 fL (ref 79–97)
Platelets: 280 10*3/uL (ref 150–450)
RBC: 3.36 x10E6/uL — ABNORMAL LOW (ref 3.77–5.28)
RDW: 13.9 % (ref 11.7–15.4)
WBC: 3.9 10*3/uL (ref 3.4–10.8)

## 2021-12-25 LAB — MAGNESIUM: Magnesium: 2.2 mg/dL (ref 1.6–2.3)

## 2021-12-27 ENCOUNTER — Telehealth (HOSPITAL_BASED_OUTPATIENT_CLINIC_OR_DEPARTMENT_OTHER): Payer: Self-pay

## 2021-12-27 NOTE — Telephone Encounter (Addendum)
Called patient to discuss lab results. Left VM ok per DPR    ----- Message from Loel Dubonnet, NP sent at 12/27/2021  7:49 AM EST ----- Kidney function improving. Normal electrolytes. CBC shows stable mild anemia. Continue current medications.

## 2021-12-30 ENCOUNTER — Ambulatory Visit (HOSPITAL_COMMUNITY): Payer: Medicare Other | Admitting: Physician Assistant

## 2022-01-02 ENCOUNTER — Encounter (HOSPITAL_COMMUNITY): Payer: Self-pay

## 2022-01-02 ENCOUNTER — Encounter: Payer: Self-pay | Admitting: Physician Assistant

## 2022-01-02 ENCOUNTER — Other Ambulatory Visit (HOSPITAL_COMMUNITY): Payer: Medicare Other

## 2022-01-02 ENCOUNTER — Inpatient Hospital Stay (HOSPITAL_COMMUNITY)
Admission: EM | Admit: 2022-01-02 | Discharge: 2022-01-05 | DRG: 308 | Disposition: A | Discharge status: Nursing Home (Includes ICF) | Payer: Medicare Other | Attending: Internal Medicine | Admitting: Internal Medicine

## 2022-01-02 ENCOUNTER — Emergency Department (HOSPITAL_COMMUNITY): Payer: Medicare Other

## 2022-01-02 DIAGNOSIS — I495 Sick sinus syndrome: Secondary | ICD-10-CM | POA: Diagnosis present

## 2022-01-02 DIAGNOSIS — I10 Essential (primary) hypertension: Secondary | ICD-10-CM | POA: Diagnosis present

## 2022-01-02 DIAGNOSIS — I5033 Acute on chronic diastolic (congestive) heart failure: Secondary | ICD-10-CM | POA: Diagnosis present

## 2022-01-02 DIAGNOSIS — Z79899 Other long term (current) drug therapy: Secondary | ICD-10-CM | POA: Diagnosis not present

## 2022-01-02 DIAGNOSIS — T502X5A Adverse effect of carbonic-anhydrase inhibitors, benzothiadiazides and other diuretics, initial encounter: Secondary | ICD-10-CM | POA: Diagnosis not present

## 2022-01-02 DIAGNOSIS — Z8249 Family history of ischemic heart disease and other diseases of the circulatory system: Secondary | ICD-10-CM | POA: Diagnosis not present

## 2022-01-02 DIAGNOSIS — I480 Paroxysmal atrial fibrillation: Secondary | ICD-10-CM | POA: Diagnosis not present

## 2022-01-02 DIAGNOSIS — Z808 Family history of malignant neoplasm of other organs or systems: Secondary | ICD-10-CM | POA: Diagnosis not present

## 2022-01-02 DIAGNOSIS — I440 Atrioventricular block, first degree: Secondary | ICD-10-CM | POA: Diagnosis present

## 2022-01-02 DIAGNOSIS — R9431 Abnormal electrocardiogram [ECG] [EKG]: Secondary | ICD-10-CM | POA: Diagnosis not present

## 2022-01-02 DIAGNOSIS — I351 Nonrheumatic aortic (valve) insufficiency: Secondary | ICD-10-CM | POA: Diagnosis not present

## 2022-01-02 DIAGNOSIS — I130 Hypertensive heart and chronic kidney disease with heart failure and stage 1 through stage 4 chronic kidney disease, or unspecified chronic kidney disease: Secondary | ICD-10-CM | POA: Diagnosis not present

## 2022-01-02 DIAGNOSIS — I77810 Thoracic aortic ectasia: Secondary | ICD-10-CM | POA: Diagnosis present

## 2022-01-02 DIAGNOSIS — Z880 Allergy status to penicillin: Secondary | ICD-10-CM

## 2022-01-02 DIAGNOSIS — M7989 Other specified soft tissue disorders: Secondary | ICD-10-CM | POA: Diagnosis not present

## 2022-01-02 DIAGNOSIS — E785 Hyperlipidemia, unspecified: Secondary | ICD-10-CM | POA: Diagnosis present

## 2022-01-02 DIAGNOSIS — U071 COVID-19: Secondary | ICD-10-CM | POA: Diagnosis not present

## 2022-01-02 DIAGNOSIS — Z7901 Long term (current) use of anticoagulants: Secondary | ICD-10-CM

## 2022-01-02 DIAGNOSIS — Z8542 Personal history of malignant neoplasm of other parts of uterus: Secondary | ICD-10-CM | POA: Diagnosis not present

## 2022-01-02 DIAGNOSIS — Z90711 Acquired absence of uterus with remaining cervical stump: Secondary | ICD-10-CM | POA: Diagnosis not present

## 2022-01-02 DIAGNOSIS — E1122 Type 2 diabetes mellitus with diabetic chronic kidney disease: Secondary | ICD-10-CM | POA: Diagnosis present

## 2022-01-02 DIAGNOSIS — N179 Acute kidney failure, unspecified: Secondary | ICD-10-CM | POA: Diagnosis present

## 2022-01-02 DIAGNOSIS — L538 Other specified erythematous conditions: Secondary | ICD-10-CM | POA: Diagnosis not present

## 2022-01-02 DIAGNOSIS — R001 Bradycardia, unspecified: Secondary | ICD-10-CM | POA: Diagnosis present

## 2022-01-02 DIAGNOSIS — R531 Weakness: Secondary | ICD-10-CM | POA: Diagnosis present

## 2022-01-02 DIAGNOSIS — I4819 Other persistent atrial fibrillation: Secondary | ICD-10-CM | POA: Diagnosis not present

## 2022-01-02 DIAGNOSIS — E782 Mixed hyperlipidemia: Secondary | ICD-10-CM | POA: Diagnosis present

## 2022-01-02 DIAGNOSIS — M8580 Other specified disorders of bone density and structure, unspecified site: Secondary | ICD-10-CM | POA: Diagnosis present

## 2022-01-02 DIAGNOSIS — I959 Hypotension, unspecified: Secondary | ICD-10-CM | POA: Diagnosis not present

## 2022-01-02 DIAGNOSIS — Z8616 Personal history of COVID-19: Secondary | ICD-10-CM

## 2022-01-02 DIAGNOSIS — N1832 Chronic kidney disease, stage 3b: Secondary | ICD-10-CM | POA: Diagnosis present

## 2022-01-02 LAB — RESP PANEL BY RT-PCR (FLU A&B, COVID) ARPGX2
Influenza A by PCR: NEGATIVE
Influenza B by PCR: NEGATIVE
SARS Coronavirus 2 by RT PCR: POSITIVE — AB

## 2022-01-02 LAB — BASIC METABOLIC PANEL
Anion gap: 5 (ref 5–15)
BUN: 46 mg/dL — ABNORMAL HIGH (ref 8–23)
CO2: 25 mmol/L (ref 22–32)
Calcium: 9.1 mg/dL (ref 8.9–10.3)
Chloride: 106 mmol/L (ref 98–111)
Creatinine, Ser: 2.42 mg/dL — ABNORMAL HIGH (ref 0.44–1.00)
GFR, Estimated: 19 mL/min — ABNORMAL LOW (ref >60)
Glucose, Bld: 139 mg/dL — ABNORMAL HIGH (ref 70–99)
Potassium: 5 mmol/L (ref 3.5–5.1)
Sodium: 136 mmol/L (ref 135–145)

## 2022-01-02 LAB — CBC WITH DIFFERENTIAL/PLATELET
Abs Immature Granulocytes: 0.04 10*3/uL (ref 0.00–0.07)
Basophils Absolute: 0 10*3/uL (ref 0.0–0.1)
Basophils Relative: 0 %
Eosinophils Absolute: 0 10*3/uL (ref 0.0–0.5)
Eosinophils Relative: 0 %
HCT: 33.1 % — ABNORMAL LOW (ref 36.0–46.0)
Hemoglobin: 10.6 g/dL — ABNORMAL LOW (ref 12.0–15.0)
Immature Granulocytes: 1 %
Lymphocytes Relative: 9 %
Lymphs Abs: 0.6 10*3/uL — ABNORMAL LOW (ref 0.7–4.0)
MCH: 31.9 pg (ref 26.0–34.0)
MCHC: 32 g/dL (ref 30.0–36.0)
MCV: 99.7 fL (ref 80.0–100.0)
Monocytes Absolute: 0.6 10*3/uL (ref 0.1–1.0)
Monocytes Relative: 9 %
Neutro Abs: 5.7 10*3/uL (ref 1.7–7.7)
Neutrophils Relative %: 81 %
Platelets: 189 10*3/uL (ref 150–400)
RBC: 3.32 MIL/uL — ABNORMAL LOW (ref 3.87–5.11)
RDW: 16.1 % — ABNORMAL HIGH (ref 11.5–15.5)
WBC: 7 10*3/uL (ref 4.0–10.5)
nRBC: 0 % (ref 0.0–0.2)

## 2022-01-02 LAB — SURGICAL PCR SCREEN
MRSA, PCR: NEGATIVE
Staphylococcus aureus: NEGATIVE

## 2022-01-02 MED ORDER — TORSEMIDE 20 MG PO TABS
10.0000 mg | ORAL_TABLET | ORAL | Status: DC
  Administered 2022-01-03 – 2022-01-05 (×2): 10 mg via ORAL
  Filled 2022-01-02 (×2): qty 1

## 2022-01-02 MED ORDER — CHLORHEXIDINE GLUCONATE 4 % EX LIQD
60.0000 mL | Freq: Once | CUTANEOUS | Status: AC
  Administered 2022-01-03: 4 via TOPICAL
  Filled 2022-01-02: qty 60

## 2022-01-02 MED ORDER — APIXABAN 2.5 MG PO TABS: 2.5000 mg | ORAL_TABLET | Freq: Two times a day (BID) | ORAL | Status: DC

## 2022-01-02 MED ORDER — ATROPINE SULFATE 1 MG/ML IV SOLN
0.4000 mg | Freq: Once | INTRAVENOUS | Status: DC
  Filled 2022-01-02: qty 0.4

## 2022-01-02 MED ORDER — ORAL CARE MOUTH RINSE
15.0000 mL | Freq: Two times a day (BID) | OROMUCOSAL | Status: DC
  Administered 2022-01-02 – 2022-01-05 (×5): 15 mL via OROMUCOSAL

## 2022-01-02 MED ORDER — PANTOPRAZOLE SODIUM 40 MG PO TBEC
40.0000 mg | DELAYED_RELEASE_TABLET | Freq: Every day | ORAL | Status: DC
  Administered 2022-01-03 – 2022-01-05 (×3): 40 mg via ORAL
  Filled 2022-01-02 (×3): qty 1

## 2022-01-02 MED ORDER — ONDANSETRON HCL 4 MG/2ML IJ SOLN: 4.0000 mg | Freq: Four times a day (QID) | INTRAMUSCULAR | Status: DC | PRN

## 2022-01-02 MED ORDER — SODIUM CHLORIDE 0.9 % IV SOLN: INTRAVENOUS | Status: DC

## 2022-01-02 MED ORDER — ATROPINE SULFATE 1 MG/10ML IJ SOSY
0.5000 mg | PREFILLED_SYRINGE | Freq: Once | INTRAMUSCULAR | Status: AC
  Administered 2022-01-02: 0.5 mg via INTRAVENOUS

## 2022-01-02 MED ORDER — SODIUM CHLORIDE 0.9% FLUSH
3.0000 mL | Freq: Two times a day (BID) | INTRAVENOUS | Status: DC
  Administered 2022-01-02 – 2022-01-05 (×7): 3 mL via INTRAVENOUS

## 2022-01-02 MED ORDER — ALBUTEROL SULFATE (2.5 MG/3ML) 0.083% IN NEBU: 2.5000 mg | INHALATION_SOLUTION | RESPIRATORY_TRACT | Status: DC | PRN

## 2022-01-02 MED ORDER — VANCOMYCIN HCL IN DEXTROSE 1-5 GM/200ML-% IV SOLN: 1000.0000 mg | INTRAVENOUS | Status: AC

## 2022-01-02 MED ORDER — SODIUM CHLORIDE 0.9 % IV SOLN: 250.0000 mL | INTRAVENOUS | Status: DC | PRN

## 2022-01-02 MED ORDER — FUROSEMIDE 10 MG/ML IJ SOLN
20.0000 mg | Freq: Once | INTRAMUSCULAR | Status: AC
  Administered 2022-01-02: 20 mg via INTRAVENOUS
  Filled 2022-01-02: qty 2

## 2022-01-02 MED ORDER — CHLORHEXIDINE GLUCONATE 4 % EX LIQD
60.0000 mL | Freq: Once | CUTANEOUS | Status: AC
  Administered 2022-01-02: 4 via TOPICAL
  Filled 2022-01-02: qty 60

## 2022-01-02 MED ORDER — ATORVASTATIN CALCIUM 10 MG PO TABS
10.0000 mg | ORAL_TABLET | Freq: Every day | ORAL | Status: DC
  Administered 2022-01-02 – 2022-01-04 (×3): 10 mg via ORAL
  Filled 2022-01-02 (×3): qty 1

## 2022-01-02 MED ORDER — NYSTATIN 100000 UNIT/GM EX POWD
1.0000 g | Freq: Two times a day (BID) | CUTANEOUS | Status: DC
  Administered 2022-01-02 – 2022-01-05 (×6): 1 via TOPICAL
  Filled 2022-01-02: qty 15

## 2022-01-02 MED ORDER — LETROZOLE 2.5 MG PO TABS
2.5000 mg | ORAL_TABLET | Freq: Every day | ORAL | Status: DC
  Administered 2022-01-03 – 2022-01-05 (×3): 2.5 mg via ORAL
  Filled 2022-01-02 (×3): qty 1

## 2022-01-02 MED ORDER — ACETAMINOPHEN 325 MG PO TABS: 650.0000 mg | ORAL_TABLET | ORAL | Status: DC | PRN

## 2022-01-02 MED ORDER — SODIUM CHLORIDE 0.9 % IV SOLN
80.0000 mg | INTRAVENOUS | Status: AC
  Filled 2022-01-02 (×2): qty 2

## 2022-01-02 MED ORDER — SODIUM CHLORIDE 0.9% FLUSH: 3.0000 mL | INTRAVENOUS | Status: DC | PRN

## 2022-01-02 MED ORDER — ASPIRIN EC 81 MG PO TBEC: 81.0000 mg | DELAYED_RELEASE_TABLET | Freq: Every day | ORAL | Status: DC

## 2022-01-02 MED ORDER — CHLORHEXIDINE GLUCONATE CLOTH 2 % EX PADS
6.0000 | MEDICATED_PAD | Freq: Every day | CUTANEOUS | Status: DC
  Administered 2022-01-04 – 2022-01-05 (×2): 6 via TOPICAL

## 2022-01-02 MED ORDER — GUAIFENESIN 100 MG/5ML PO LIQD
10.0000 mL | Freq: Four times a day (QID) | ORAL | Status: DC | PRN
  Filled 2022-01-02: qty 10

## 2022-01-02 NOTE — ED Triage Notes (Signed)
Pt BIBA from [facility]. Pt was seen about 2 weeks ago for tachycardia and started on new medications. Pt is now bradycardic in the 20s-30s. Pt c/o SHOB and faintness.

## 2022-01-02 NOTE — Consult Note (Deleted)
Cardiology Consultation:   Patient ID: Christina Morgan MRN: [id number]; DOB: May 13, 1933  Admit date: 01/02/2022 Date of Consult: 01/02/2022  PCP:  Orvis Brill, [place] [facility] Cardiologist:  Fransico Him, MD        Patient Profile:   Christina Morgan is a 88 y.o. female with a hx of persistent atrial fibrillation (Apixaban, Flecainide), HFpEF, hypertension, hyperlipidemia, aortic insufficiency, chronic kidney disease, symptomatic bradycardia who is being seen 01/02/2022 for the evaluation of bradycardia at the request of Dr. Alvino Chapel.  History of Present Illness:   Christina Morgan was last seen in clinic by Laurann Montana, NP on 12/24/21 for post hospital follow up.  She was admitted in Dec 2022 with symptomatic bradycardia (HRs in the 30s) and clonidine and atenolol were both DC'd.  She developed COVID-19 after DC.  She then presented to the ED with AFib with RVR on 12/15/21 and underwent DCCV.  She was started back on beta blocker Rx with Metoprolol succinate 25 mg daily (she was previously on 100 mg daily).  When she saw Urban Gibson, she was in NSR with a HR of 61.  She presented the emergency room today via EMS from [facility] with shortness of breath and generalized weakness.  She was found to have a heart rate in the 20s.  She has been given atropine with some improvement in her heart rate.  Chest x-ray does demonstrate some edema.  Her creatinine is worse (2.42; up from 1.67 on 1/13).  She has recently been feeling weak and near syncopal.  She has not had syncope.  She feels more short of breath and her legs are more swollen.  She feels that her abdomen is also distended.  She has not had chest pain. She sleeps in a recliner and has done so for years.   Prior CV studies: Echocardiogram 02/12/2020 EF 60-65, no RWMA, GRII DD, mild LAE, mild aortic insufficiency, AV sclerosis without stenosis  Myoview 12/14/2018 Normal perfusion, EF 62; low risk   Data: K+ 5,  Creatinine 2.42, BUN 46, Hgb 10.6, MCV 99.7 SARS-CoV-2, Influenza A/B pending CXR: cardiomegaly with mild pulmonary vascular congestion and small bilateral pleural effusions EKG #1: Sinus bradycardia, HR 26, IVCD, QRS 119, QTc 383 EKG #2: Probable sinus bradycardia, HR 25, cannot rule out atrial fibrillation EKG #3: Sinus bradycardia with PACs versus atrial fibrillation, HR 55   Past Medical History:  Diagnosis Date   Cancer ([place])    endometrial ca   Chronic diastolic CHF (congestive heart failure) (HCC)    Chronic edema    CKD (chronic kidney disease), stage III (HCC)    Dyslipidemia    Fracture of left humerus    History of cardiovascular stress test    Lexiscan Myoview (06/2014): No ischemia or scar, not gated, low risk   Hx of echocardiogram    Echo (05/02/14): EF 60% to 65%. No regional wall motion abnormalities. Mild AI, mildly dilated aortic root (37 mm), trivial MR, trivial TR   Hyperlipidemia    Hypertension    Lipoma of skin    multiple   Osteopenia    PAF (paroxysmal atrial fibrillation) ([place])    failed DCCV 05/2014 >> Flecainide started >> DCCV 7/15 sucessful;  f/u ETT neg for pro-arrhythmia >> recurrent AF/AFL >> Flecainide inc to 100 bid with repeat DCCV 9/15   Vitamin D deficiency 09/25/2019    Past Surgical History:  Procedure Laterality Date   CARDIOVERSION N/A 06/05/2014   Procedure: CARDIOVERSION;  Surgeon:  Sueanne Margarita, MD;  Location: [place];  Service: Cardiovascular;  Laterality: N/A;   CARDIOVERSION N/A 07/11/2014   Procedure: CARDIOVERSION;  Surgeon: Sueanne Margarita, MD;  Location: [place];  Service: Cardiovascular;  Laterality: N/A;   CARDIOVERSION N/A 09/04/2014   Procedure: CARDIOVERSION;  Surgeon: Sueanne Margarita, MD;  Location: [facility] ENDOSCOPY;  Service: Cardiovascular;  Laterality: N/A;   COLONOSCOPY  2008   COLONOSCOPY N/A 05/20/2015   Procedure: COLONOSCOPY;  Surgeon: Ronald Lobo, MD;  Location: [facility] ENDOSCOPY;  Service: Endoscopy;  Laterality:  N/A;   FEMUR IM NAIL Right 09/24/2019   Procedure: INTRAMEDULLARY (IM) NAIL FEMORAL;  Surgeon: Altamese [place]iders[place], MD;  Location: [place];  Service: Orthopedics;  Laterality: Right;   IR KYPHO EA ADDL LEVEL THORACIC OR LUMBAR  04/12/2019   IR KYPHO THORACIC WITH BONE BIOPSY  04/12/2019   ROBOTIC ASSISTED SUPRACERVICAL HYSTERECTOMY WITH BILATERAL SALPINGO OOPHERECTOMY  11/06/2012   and bilateral pelvic lymph node dissection     Home Medications:  Prior to Admission medications   Medication Sig Start Date End Date Taking? Authorizing Provider  acetaminophen (TYLENOL) 500 MG tablet Take 1,000 mg by mouth 3 (three) times daily as needed for moderate pain or mild pain.    [provider]  albuterol (VENTOLIN HFA) 108 (90 Base) MCG/ACT inhaler Inhale 2 puffs into the lungs every 4 (four) hours as needed for wheezing or shortness of breath (please try to use ALbuterol inhaler PRIOR to Nebs). Patient taking differently: Inhale 2 puffs into the lungs every 4 (four) hours as needed for wheezing or shortness of breath. 10/11/19   Angiulli, Lavon Paganini, PA-C  amLODipine (NORVASC) 5 MG tablet Take 1 tablet (5 mg total) by mouth daily. 12/01/21   Barrett, Evelene Croon, PA-C  apixaban (ELIQUIS) 2.5 MG TABS tablet Take 1 tablet (2.5 mg total) by mouth 2 (two) times daily. 04/26/21   Sueanne Margarita, MD  atorvastatin (LIPITOR) 10 MG tablet Take 1 tablet (10 mg total) by mouth at bedtime. 04/26/21   Sueanne Margarita, MD  Cholecalciferol (VITAMIN D) 125 MCG (5000 UT) CAPS Take 125 mcg by mouth daily.    [provider]  flecainide (TAMBOCOR) 100 MG tablet Take 1 tablet (100 mg total) by mouth 2 (two) times daily. 04/26/21   Sueanne Margarita, MD  hydrALAZINE (APRESOLINE) 50 MG tablet Take 1 tablet (50 mg total) by mouth every 8 (eight) hours. 11/30/21   Barrett, Evelene Croon, PA-C  letrozole (FEMARA) 2.5 MG tablet Take 2.5 mg by mouth daily.    [provider]  metoprolol succinate (TOPROL-XL) 25 MG 24 hr tablet  Take 1 tablet (25 mg total) by mouth daily. 12/16/21   Isla Pence, MD  Multiple Vitamin (DAILY VITE PO) Take 1 tablet by mouth daily.    [provider]  NYSTATIN powder Apply 1 g topically See admin instructions. Spread topically to area under bilateral breasts & groin twice a day at 8 AM and 8 PM 01/29/20   [provider]  omeprazole (PRILOSEC) 20 MG capsule Take 20 mg by mouth daily. 04/14/21   [provider]  torsemide (DEMADEX) 10 MG tablet Take 10 mg by mouth every Monday, Wednesday, and Friday. 11/15/21   [provider]  vitamin B-12 (CYANOCOBALAMIN) 250 MCG tablet Take 250 mcg by mouth every other day.    [provider]  vitamin C (VITAMIN C) 500 MG tablet Take 1 tablet (500 mg total) by mouth daily. 09/28/19   Antonieta Pert,  MD    Inpatient Medications: Scheduled Meds:  Continuous Infusions:  PRN Meds:   Allergies:    Allergies  Allergen Reactions   Penicillins Itching and Other (See Comments)    Dizziness to the patient of passing out- told by MD to "never take this again" Did it involve swelling of the face/tongue/throat, SOB, or low BP? No Did it involve sudden or severe rash/hives, skin peeling, or any reaction on the inside of your mouth or nose? No Did you need to seek medical attention at a hospital or doctor's office? No When did it last happen? "a long time ago"    If all above answers are "NO", may proceed with cephalosporin use.     Social History:   Social History   Socioeconomic History   Marital status: Widowed    Spouse name: Not on file   Number of children: Not on file   Years of education: Not on file   Highest education level: Not on file  Occupational History   Not on file  Tobacco Use   Smoking status: Never   Smokeless tobacco: Never  Vaping Use   Vaping Use: Never used  Substance and Sexual Activity   Alcohol use: No   Drug use: No   Sexual activity: Not on file  Other Topics Concern   Not  on file  Social History Narrative   ** Merged History Encounter **       Social Determinants of Health   Financial Resource Strain: Not on file  Food Insecurity: Not on file  Transportation Needs: Not on file  Physical Activity: Not on file  Stress: Not on file  Social Connections: Not on file  Intimate Partner Violence: Not on file    Family History:    Family History  Problem Relation Age of Onset   Cancer Father 80       metastatic oropharyngeal ca   Heart attack Mother    Coronary artery disease Mother      ROS:  Please see the history of present illness.  She has not had fevers, cough, vomiting, melena, hematochezia.  She had some diarrhea a week ago but this resolved.   All other ROS reviewed and negative.     Physical Exam/Data:   Vitals:   01/02/22 1200 01/02/22 1215 01/02/22 1230 01/02/22 1245  BP: 101/67 110/82 (!) 113/55 (!) 116/54  Pulse: (!) 36 (!) 26 (!) 56 (!) 27  Resp: 14 17 18 16   Temp:      SpO2: 100% 98% 99% 100%  Weight:      Height:       No intake or output data in the 24 hours ending 01/02/22 1258 Last 3 Weights 01/02/2022 12/24/2021 11/30/2021  Weight (lbs) 176 lb 176 lb 11.2 oz 163 lb 2.3 oz  Weight (kg) 79.833 kg 80.151 kg 74 kg     Body mass index is 28.41 kg/m.  General:  Well nourished, well developed, in no acute distress  HEENT: normal Neck: + JVD to jaw Vascular: No carotid bruits  Cardiac:  bradycardic rhythm, irregular with no obvious murmur  Lungs:  bibasilar rales  Abd: distended  Ext: 2+ bilateral LE edema Musculoskeletal:  No deformities, BUE and BLE strength normal and equal Skin: warm and dry  Neuro:  CNs 2-12 intact, no focal abnormalities noted Psych:  Normal affect   EKG:  The EKG was personally reviewed and demonstrates:  see HPI Telemetry:  Telemetry was personally reviewed and demonstrates:  junctional brady vs complete heart block   Laboratory Data:  High Sensitivity Troponin:   Recent Labs  Lab  12/16/21 1245 12/16/21 1424  TROPONINIHS 11 12     Chemistry Recent Labs  Lab 01/02/22 1057  NA 136  K 5.0  CL 106  CO2 25  GLUCOSE 139*  BUN 46*  CREATININE 2.42*  CALCIUM 9.1  GFRNONAA 19*  ANIONGAP 5    No results for input(s): PROT, ALBUMIN, AST, ALT, ALKPHOS, BILITOT in the last 168 hours. Lipids No results for input(s): CHOL, TRIG, HDL, LABVLDL, LDLCALC, CHOLHDL in the last 168 hours.  Hematology Recent Labs  Lab 01/02/22 1057  WBC 7.0  RBC 3.32*  HGB 10.6*  HCT 33.1*  MCV 99.7  MCH 31.9  MCHC 32.0  RDW 16.1*  PLT 189    Radiology/Studies:  DG Chest Portable 1 View  Result Date: 01/02/2022 CLINICAL DATA:  Weakness EXAM: PORTABLE CHEST 1 VIEW COMPARISON:  Chest x-ray 12/15/2021 FINDINGS: Heart is enlarged. Mediastinum appears stable. Calcified plaques in the aortic arch. Mild pulmonary vascular/interstitial prominence similar to previous study. Small bilateral pleural effusions. No pneumothorax. IMPRESSION: Cardiomegaly with mild pulmonary vascular congestion and small bilateral pleural effusions. Electronically Signed   By: Ofilia Neas M.D.   On: 01/02/2022 11:37     Assessment and Plan:   1. Symptomatic bradycardia Her tele currently shows probably junctional bradycardia.  There are some p waves occasionally that to not seem to be conducted.  She was recently taken off of her beta-blocker b/c of bradycardia.  She then developed atrial fibrillation with RVR and was cardioverted. She was started back on Toprol XL 25 mg once daily.  She likely has tachy-brady syndrome.  She may need a pacemaker.  Consider EP referral.  Stop beta-blocker.  External pacer.  Admit to CCU.   2. Acute on chronic (HFpEF) heart failure with preserved ejection fraction She is volume overloaded.  She also has evidence of AKI on chronic kidney disease.  Creatinine is now 2.4.  Her congestive heart failure is probably worsened by her bradycardia.  Will gently diurese with IV Lasix.    3. Acute kidney injury on chronic kidney disease  Likely related to poor perfusion from bradycardia.  Will need to monitor renal function closely in light of need for diuresis.    4. Paroxysmal atrial fibrillation She remains on Flecainide and I do not think any of her EKGs have show atrial fibrillation here.  She has rapid rate when in atrial fibrillation.  Continue Apixaban 2.5 mg twice daily (age, creatinine).    5. Aortic insufficiency Mild on most recent echocardiogram in 02/2020.     Risk Assessment/Risk Scores:        New York Heart Association (NYHA) Functional Class NYHA Class III  CHA2DS2-VASc Score = 5   This indicates a 7.2% annual risk of stroke. The patient's score is based upon: CHF History: 1 HTN History: 1 Diabetes History: 0 Stroke History: 0 Vascular Disease History: 0 Age Score: 2 Gender Score: 1      For questions or updates, please contact [place] Please consult [email or web address] for contact info under   Severity of Illness: The appropriate patient status for this patient is INPATIENT. Inpatient status is judged to be reasonable and necessary in order to provide the required intensity of service to ensure the patient's safety. The patient's presenting symptoms, physical exam findings, and initial radiographic and laboratory data in the context of their chronic comorbidities is felt to  place them at high risk for further clinical deterioration. Furthermore, it is not anticipated that the patient will be medically stable for discharge from the hospital within 2 midnights of admission.   * I certify that at the point of admission it is my clinical judgment that the patient will require inpatient hospital care spanning beyond 2 midnights from the point of admission due to high intensity of service, high risk for further deterioration and high frequency of surveillance required.*   Signed, Richardson Dopp, PA-C  01/02/2022 12:58 PM

## 2022-01-02 NOTE — ED Provider Notes (Signed)
[facility] EMERGENCY DEPARTMENT Provider Note   CSN: [id number] Arrival date & time: 01/02/22  1042     History  Chief Complaint  Patient presents with   Bradycardia    Christina Morgan is a 88 y.o. female.  HPI Patient presents with shortness of breath and generalized weakness.  Began today.  Brought in by EMS and found to have heart rate in the 20s.  History of bradycardia and A. fib.  Recent admission for same.  Had been doing well up until today.  No chest pain.  Does have some trouble breathing but more fatigued.  States she is generally weak and would have trouble getting up and walking around.  No chest pain.  Did have a small breakfast this morning.    Home Medications Prior to Admission medications   Medication Sig Start Date End Date Taking? Authorizing Provider  acetaminophen (TYLENOL) 500 MG tablet Take 1,000 mg by mouth 3 (three) times daily as needed for moderate pain or mild pain.   Yes [provider]  albuterol (VENTOLIN HFA) 108 (90 Base) MCG/ACT inhaler Inhale 2 puffs into the lungs every 4 (four) hours as needed for wheezing or shortness of breath (please try to use ALbuterol inhaler PRIOR to Nebs). Patient taking differently: Inhale 2 puffs into the lungs every 4 (four) hours as needed for wheezing or shortness of breath. 10/11/19  Yes Angiulli, Lavon Paganini, PA-C  amLODipine (NORVASC) 5 MG tablet Take 1 tablet (5 mg total) by mouth daily. 12/01/21  Yes Barrett, Evelene Croon, PA-C  apixaban (ELIQUIS) 2.5 MG TABS tablet Take 1 tablet (2.5 mg total) by mouth 2 (two) times daily. 04/26/21  Yes Turner, Eber Hong, MD  atorvastatin (LIPITOR) 10 MG tablet Take 1 tablet (10 mg total) by mouth at bedtime. 04/26/21  Yes Turner, Eber Hong, MD  Cholecalciferol (VITAMIN D) 125 MCG (5000 UT) CAPS Take 125 mcg by mouth daily.   Yes [provider]  guaiFENesin (ROBITUSSIN) 100 MG/5ML liquid Take 10 mLs by mouth every 6 (six) hours as needed for cough or  to loosen phlegm.   Yes [provider]  hydrALAZINE (APRESOLINE) 50 MG tablet Take 1 tablet (50 mg total) by mouth every 8 (eight) hours. 11/30/21  Yes Barrett, Evelene Croon, PA-C  letrozole (FEMARA) 2.5 MG tablet Take 2.5 mg by mouth daily.   Yes [provider]  loperamide (IMODIUM A-D) 2 MG tablet Take 2 mg by mouth every 6 (six) hours as needed for diarrhea or loose stools. Do not exceed 8mg  in 24 hours.   Yes [provider]  Multiple Vitamin (DAILY VITE PO) Take 1 tablet by mouth daily.   Yes [provider]  NYSTATIN powder Apply 1 g topically See admin instructions. Spread topically to area under bilateral breasts & groin twice a day at 8 AM and 8 PM 01/29/20  Yes [provider]  omeprazole (PRILOSEC) 20 MG capsule Take 20 mg by mouth daily. 04/14/21  Yes [provider]  torsemide (DEMADEX) 10 MG tablet Take 10 mg by mouth every Monday, Wednesday, and Friday. 11/15/21  Yes [provider]  torsemide (DEMADEX) 20 MG tablet Take 10 mg by mouth daily as needed (weight gain or 5lbs in 1 week or swelling). This is in conjunction with the daily Torsemide patient takes.   Yes [provider]  vitamin B-12 (CYANOCOBALAMIN) 250 MCG tablet Take 250 mcg by mouth every other day.   Yes [provider]  vitamin C (VITAMIN C) 500 MG tablet Take 1 tablet (500 mg total) by mouth daily. 09/28/19  Yes Antonieta Pert, MD      Allergies    Penicillins    Review of Systems   Review of Systems  Constitutional:  Negative for appetite change.  Respiratory:  Positive for shortness of breath.   Cardiovascular:  Negative for chest pain.  Gastrointestinal:  Negative for abdominal pain.  Musculoskeletal:  Negative for back pain.  Neurological:  Positive for weakness.   Physical Exam Updated Vital Signs BP (!) 124/56    Pulse (!) 37    Temp 98.1 F (36.7 C)    Resp 15    Ht 5\' 6"  (1.676 m)    Wt 79.8 kg    SpO2 98%    BMI 28.41 kg/m   Physical Exam Vitals and nursing note reviewed.  Cardiovascular:     Rate and Rhythm: Regular rhythm. Bradycardia present.  Pulmonary:     Breath sounds: No wheezing.  Abdominal:     Tenderness: There is no abdominal tenderness.  Musculoskeletal:        General: No tenderness.  Neurological:     Mental Status: She is alert.    ED Results / Procedures / Treatments   Labs (all labs ordered are listed, but only abnormal results are displayed) Labs Reviewed  RESP PANEL BY RT-PCR (FLU A&B, COVID) ARPGX2 - Abnormal; Notable for the following components:      Result Value   SARS Coronavirus 2 by RT PCR POSITIVE (*)    All other components within normal limits  BASIC METABOLIC PANEL - Abnormal; Notable for the following components:   Glucose, Bld 139 (*)    BUN 46 (*)    Creatinine, Ser 2.42 (*)    GFR, Estimated 19 (*)    All other components within normal limits  CBC WITH DIFFERENTIAL/PLATELET - Abnormal; Notable for the following components:   RBC 3.32 (*)    Hemoglobin 10.6 (*)    HCT 33.1 (*)    RDW 16.1 (*)    Lymphs Abs 0.6 (*)    All other components within normal limits  SURGICAL PCR SCREEN  TSH  FERRITIN  C-REACTIVE PROTEIN    EKG EKG Interpretation  Date/Time:  Sunday January 02 2022 10:57:17 EST Ventricular Rate:  25 PR Interval:    QRS Duration: 112 QT Interval:  614 QTC Calculation: 396 R Axis:   117 Text Interpretation: Atrial fibrillation vs sinus. Borderline intraventricular conduction delay Nonspecific T abnrm, anterolateral leads Confirmed by Davonna Belling [phone]) on 01/02/2022 11:17:12 AM  Radiology DG Chest Portable 1 View  Result Date: 01/02/2022 CLINICAL DATA:  Weakness EXAM: PORTABLE CHEST 1 VIEW COMPARISON:  Chest x-ray 12/15/2021 FINDINGS: Heart is enlarged. Mediastinum appears stable. Calcified plaques in the aortic arch. Mild pulmonary vascular/interstitial prominence similar to previous study. Small bilateral pleural effusions. No  pneumothorax. IMPRESSION: Cardiomegaly with mild pulmonary vascular congestion and small bilateral pleural effusions. Electronically Signed   By: Ofilia Neas M.D.   On: 01/02/2022 11:37    Procedures Procedures    Medications Ordered in ED Medications  acetaminophen (TYLENOL) tablet 650 mg (has no administration in time range)  ondansetron (ZOFRAN) injection 4 mg (has no administration in time range)  sodium chloride flush (NS) 0.9 % injection 3 mL (3 mLs Intravenous Given 01/02/22 1340)  sodium chloride flush (NS) 0.9 % injection 3 mL (has no administration in time range)  0.9 %  sodium chloride infusion (has  no administration in time range)  letrozole [facility]) tablet 2.5 mg (has no administration in time range)  atorvastatin (LIPITOR) tablet 10 mg (has no administration in time range)  torsemide (DEMADEX) tablet 10 mg (has no administration in time range)  0.9 %  sodium chloride infusion (0 mLs Intravenous Hold 01/02/22 1354)  gentamicin (GARAMYCIN) 80 mg in sodium chloride 0.9 % 500 mL irrigation (has no administration in time range)  vancomycin (VANCOCIN) IVPB 1000 mg/200 mL premix (has no administration in time range)  chlorhexidine (HIBICLENS) 4 % liquid 4 application (has no administration in time range)  chlorhexidine (HIBICLENS) 4 % liquid 4 application (has no administration in time range)  pantoprazole (PROTONIX) EC tablet 40 mg (40 mg Oral Not Given 01/02/22 1353)  albuterol (PROVENTIL) (2.5 MG/3ML) 0.083% nebulizer solution 2.5 mg (has no administration in time range)  guaiFENesin (ROBITUSSIN) 100 MG/5ML liquid 10 mL (has no administration in time range)  nystatin (MYCOSTATIN/NYSTOP) topical powder 1 application (has no administration in time range)  atropine 1 MG/10ML injection 0.5 mg (0.5 mg Intravenous Given 01/02/22 1112)  furosemide (LASIX) injection 20 mg (20 mg Intravenous Given 01/02/22 1340)    ED Course/ Medical Decision Making/ A&P                            Medical Decision Making Problems Addressed: AKI (acute kidney injury) [facility]): acute illness or injury Symptomatic bradycardia: acute illness or injury that poses a threat to life or bodily functions  Amount and/or Complexity of Data Reviewed External Data Reviewed: ECG and notes. Labs: ordered. Radiology: ordered. Discussion of management or test interpretation with external provider(s): Dr Oval Linsey  Risk Prescription drug management. Decision regarding hospitalization.   Patient presents with bradycardia.  History of same.  Has been sinus bradycardia in the past although difficult to determine P waves on this.  It is a narrow bradycardia.  A. fib versus sinus arrest versus third-degree block.  Did have mild improvement with atropine.  Blood pressures been maintained.  COVID test interestingly came back positive.  Potentially could be a cause of the shortness of breath but not having fevers.  Seen by Dr. Oval Linsey from cardiology and also has been seen by electrophysiology.  Will admit for possible pacemaker.  Does have elevated creatinine compared to before.  Will require some diuresis.  Admit to cardiology. I independently reviewed and interpreted chest x-ray and EKGs.  CRITICAL CARE Performed by: Davonna Belling Total critical care time: 35 minutes Critical care time was exclusive of separately billable procedures and treating other patients. Critical care was necessary to treat or prevent imminent or life-threatening deterioration. Critical care was time spent personally by me on the following activities: development of treatment plan with patient and/or surrogate as well as nursing, discussions with consultants, evaluation of patient's response to treatment, examination of patient, obtaining history from patient or surrogate, ordering and performing treatments and interventions, ordering and review of laboratory studies, ordering and review of radiographic studies, pulse oximetry and  re-evaluation of patient's condition.         Final Clinical Impression(s) / ED Diagnoses Final diagnoses:  Symptomatic bradycardia  AKI (acute kidney injury) [facility])    Rx / [place] Orders ED Discharge Orders     None         Davonna Belling, MD 01/02/22 [id number]

## 2022-01-02 NOTE — H&P (Signed)
Cardiology Admission History and Physical:   Patient ID: Christina Morgan MRN: [id number]; DOB: May 30, 1933   Admission date: 01/02/2022  PCP:  Christina Morgan Providers Cardiologist:  Christina Him, MD        Chief Complaint:  weakness, dizziness  Patient Profile:   Christina Morgan is a 88 y.o. female with with a hx of persistent atrial fibrillation (Apixaban, Flecainide), HFpEF, hypertension, hyperlipidemia, aortic insufficiency, chronic kidney disease, symptomatic bradycardia who is being seen 01/02/2022 for the evaluation of bradycardia at the request of Christina Morgan.  History of Present Illness:   Christina Morgan was last seen in clinic by Christina Montana, NP on 12/24/21 for post hospital follow up.  She was admitted in Dec 2022 with symptomatic bradycardia (HRs in the 30s) and clonidine and atenolol were both DC'd.  She developed COVID-19 after DC.  She then presented to the ED with AFib with RVR on 12/15/21 and underwent DCCV.  She was started back on beta blocker Rx with Metoprolol succinate 25 mg daily (she was previously on 100 mg daily).  When she saw Christina Morgan, she was in NSR with a HR of 61.  She presented the emergency room today via EMS from [facility] with shortness of breath and generalized weakness.  She was found to have a heart rate in the 20s.  She has been given atropine with some improvement in her heart rate.  Chest x-ray does demonstrate some edema.  Her creatinine is worse (2.42; up from 1.67 on 1/13).  She has recently been feeling weak and near syncopal.  She has not had syncope.  She feels more short of breath and her legs are more swollen.  She feels that her abdomen is also distended.  She has not had chest pain. She sleeps in a recliner and has done so for years.   Prior CV studies: Echocardiogram 02/12/2020 EF 60-65, no RWMA, GRII DD, mild LAE, mild aortic insufficiency, AV sclerosis without stenosis  Myoview 12/14/2018 Normal  perfusion, EF 62; low risk   Data: K+ 5, Creatinine 2.42, BUN 46, Hgb 10.6, MCV 99.7 SARS-CoV-2, Influenza A/B pending CXR: cardiomegaly with mild pulmonary vascular congestion and small bilateral pleural effusions EKG #1: Sinus bradycardia, HR 26, IVCD, QRS 119, QTc 383 EKG #2: Probable sinus bradycardia, HR 25, cannot rule out atrial fibrillation EKG #3: Sinus bradycardia with PACs versus atrial fibrillation, HR 55   Past Medical History:  Diagnosis Date   Cancer ([place])    endometrial ca   Chronic diastolic CHF (congestive heart failure) (HCC)    Chronic edema    CKD (chronic kidney disease), stage III (HCC)    Dyslipidemia    Fracture of left humerus    History of cardiovascular stress test    Lexiscan Myoview (06/2014): No ischemia or scar, not gated, low risk   Hx of echocardiogram    Echo (05/02/14): EF 60% to 65%. No regional wall motion abnormalities. Mild AI, mildly dilated aortic root (37 mm), trivial MR, trivial TR   Hyperlipidemia    Hypertension    Lipoma of skin    multiple   Osteopenia    PAF (paroxysmal atrial fibrillation) ([place])    failed DCCV 05/2014 >> Flecainide started >> DCCV 7/15 sucessful;  f/u ETT neg for pro-arrhythmia >> recurrent AF/AFL >> Flecainide inc to 100 bid with repeat DCCV 9/15   Vitamin D deficiency 09/25/2019    Past Surgical History:  Procedure Laterality Date   CARDIOVERSION N/A  06/05/2014   Procedure: CARDIOVERSION;  Surgeon: Sueanne Margarita, MD;  Location: [place];  Service: Cardiovascular;  Laterality: N/A;   CARDIOVERSION N/A 07/11/2014   Procedure: CARDIOVERSION;  Surgeon: Sueanne Margarita, MD;  Location: [place] ENDOSCOPY;  Service: Cardiovascular;  Laterality: N/A;   CARDIOVERSION N/A 09/04/2014   Procedure: CARDIOVERSION;  Surgeon: Sueanne Margarita, MD;  Location: [facility] ENDOSCOPY;  Service: Cardiovascular;  Laterality: N/A;   COLONOSCOPY  2008   COLONOSCOPY N/A 05/20/2015   Procedure: COLONOSCOPY;  Surgeon: Ronald Lobo, MD;  Location: [facility]  ENDOSCOPY;  Service: Endoscopy;  Laterality: N/A;   FEMUR IM NAIL Right 09/24/2019   Procedure: INTRAMEDULLARY (IM) NAIL FEMORAL;  Surgeon: Altamese New Albin, MD;  Location: [place];  Service: Orthopedics;  Laterality: Right;   IR KYPHO EA ADDL LEVEL THORACIC OR LUMBAR  04/12/2019   IR KYPHO THORACIC WITH BONE BIOPSY  04/12/2019   ROBOTIC ASSISTED SUPRACERVICAL HYSTERECTOMY WITH BILATERAL SALPINGO OOPHERECTOMY  11/06/2012   and bilateral pelvic lymph node dissection     Medications Prior to Admission: Prior to Admission medications   Medication Sig Start Date End Date Taking? Authorizing Provider  acetaminophen (TYLENOL) 500 MG tablet Take 1,000 mg by mouth 3 (three) times daily as needed for moderate pain or mild pain.    [provider]  albuterol (VENTOLIN HFA) 108 (90 Base) MCG/ACT inhaler Inhale 2 puffs into the lungs every 4 (four) hours as needed for wheezing or shortness of breath (please try to use ALbuterol inhaler PRIOR to Nebs). Patient taking differently: Inhale 2 puffs into the lungs every 4 (four) hours as needed for wheezing or shortness of breath. 10/11/19   Angiulli, Lavon Paganini, PA-C  amLODipine (NORVASC) 5 MG tablet Take 1 tablet (5 mg total) by mouth daily. 12/01/21   Barrett, Evelene Croon, PA-C  apixaban (ELIQUIS) 2.5 MG TABS tablet Take 1 tablet (2.5 mg total) by mouth 2 (two) times daily. 04/26/21   Sueanne Margarita, MD  atorvastatin (LIPITOR) 10 MG tablet Take 1 tablet (10 mg total) by mouth at bedtime. 04/26/21   Sueanne Margarita, MD  Cholecalciferol (VITAMIN D) 125 MCG (5000 UT) CAPS Take 125 mcg by mouth daily.    [provider]  flecainide (TAMBOCOR) 100 MG tablet Take 1 tablet (100 mg total) by mouth 2 (two) times daily. 04/26/21   Sueanne Margarita, MD  hydrALAZINE (APRESOLINE) 50 MG tablet Take 1 tablet (50 mg total) by mouth every 8 (eight) hours. 11/30/21   Barrett, Evelene Croon, PA-C  letrozole (FEMARA) 2.5 MG tablet Take 2.5 mg by mouth daily.    [provider]  metoprolol succinate (TOPROL-XL) 25 MG 24 hr tablet Take 1 tablet (25 mg total) by mouth daily. 12/16/21   Christina Pence, MD  Multiple Vitamin (DAILY VITE PO) Take 1 tablet by mouth daily.    [provider]  NYSTATIN powder Apply 1 g topically See admin instructions. Spread topically to area under bilateral breasts & groin twice a day at 8 AM and 8 PM 01/29/20   [provider]  omeprazole (PRILOSEC) 20 MG capsule Take 20 mg by mouth daily. 04/14/21   [provider]  torsemide (DEMADEX) 10 MG tablet Take 10 mg by mouth every Monday, Wednesday, and Friday. 11/15/21   [provider]  vitamin B-12 (CYANOCOBALAMIN) 250 MCG tablet Take 250 mcg by mouth every other day.    [provider]  vitamin C (VITAMIN C) 500 MG tablet Take 1 tablet (500 mg total)  by mouth daily. 09/28/19   Antonieta Pert, MD     Allergies:    Allergies  Allergen Reactions   Penicillins Itching and Other (See Comments)    Dizziness to the patient of passing out- told by MD to "never take this again" Did it involve swelling of the face/tongue/throat, SOB, or low BP? No Did it involve sudden or severe rash/hives, skin peeling, or any reaction on the inside of your mouth or nose? No Did you need to seek medical attention at a hospital or doctor's office? No When did it last happen? "a long time ago"    If all above answers are "NO", may proceed with cephalosporin use.     Social History:   Social History   Socioeconomic History   Marital status: Widowed    Spouse name: Not on file   Number of children: Not on file   Years of education: Not on file   Highest education level: Not on file  Occupational History   Not on file  Tobacco Use   Smoking status: Never   Smokeless tobacco: Never  Vaping Use   Vaping Use: Never used  Substance and Sexual Activity   Alcohol use: No   Drug use: No   Sexual activity: Not on file  Other Topics Concern   Not on file  Social History  Narrative   ** Merged History Encounter **       Social Determinants of Health   Financial Resource Strain: Not on file  Food Insecurity: Not on file  Transportation Needs: Not on file  Physical Activity: Not on file  Stress: Not on file  Social Connections: Not on file  Intimate Partner Violence: Not on file    Family History:    The patient's family history includes Cancer (age of onset: 40) in her father; Coronary artery disease in her mother; Heart attack in her mother.    ROS:  Please see the history of present illness.  She has not had fevers, cough, vomiting, melena, hematochezia.  She had some diarrhea a week ago but this resolved.   All other ROS reviewed and negative.   Physical Exam/Data:   Vitals:   01/02/22 1200 01/02/22 1215 01/02/22 1230 01/02/22 1245  BP: 101/67 110/82 (!) 113/55 (!) 116/54  Pulse: (!) 36 (!) 26 (!) 56 (!) 27  Resp: 14 17 18 16   Temp:      SpO2: 100% 98% 99% 100%  Weight:      Height:       No intake or output data in the 24 hours ending 01/02/22 1302 Last 3 Weights 01/02/2022 12/24/2021 11/30/2021  Weight (lbs) 176 lb 176 lb 11.2 oz 163 lb 2.3 oz  Weight (kg) 79.833 kg 80.151 kg 74 kg     Body mass index is 28.41 kg/m.  General:  Well nourished, well developed, in no acute distress  HEENT: normal Neck: + JVD to jaw Vascular: No carotid bruits  Cardiac:  bradycardic rhythm, irregular with no obvious murmur  Lungs:  bibasilar rales  Abd: distended  Ext: 2+ bilateral LE edema Musculoskeletal:  No deformities, BUE and BLE strength normal and equal Skin: warm and dry  Neuro:  CNs 2-12 intact, no focal abnormalities noted Psych:  Normal affect   EKG:  The EKG was personally reviewed and demonstrates:  see HPI Telemetry:  Telemetry was personally reviewed and demonstrates:  junctional brady vs complete heart block   Laboratory Data:  High Sensitivity Troponin:  Recent Labs  Lab 12/16/21 1245 12/16/21 1424  TROPONINIHS 11 12       Chemistry Recent Labs  Lab 01/02/22 1057  NA 136  K 5.0  CL 106  CO2 25  GLUCOSE 139*  BUN 46*  CREATININE 2.42*  CALCIUM 9.1  GFRNONAA 19*  ANIONGAP 5    No results for input(s): PROT, ALBUMIN, AST, ALT, ALKPHOS, BILITOT in the last 168 hours. Lipids No results for input(s): CHOL, TRIG, HDL, LABVLDL, LDLCALC, CHOLHDL in the last 168 hours. Hematology Recent Labs  Lab 01/02/22 1057  WBC 7.0  RBC 3.32*  HGB 10.6*  HCT 33.1*  MCV 99.7  MCH 31.9  MCHC 32.0  RDW 16.1*  PLT 189   Thyroid No results for input(s): TSH, FREET4 in the last 168 hours. BNPNo results for input(s): BNP, PROBNP in the last 168 hours.  DDimer No results for input(s): DDIMER in the last 168 hours.   Radiology/Studies:  DG Chest Portable 1 View  Result Date: 01/02/2022 CLINICAL DATA:  Weakness EXAM: PORTABLE CHEST 1 VIEW COMPARISON:  Chest x-ray 12/15/2021 FINDINGS: Heart is enlarged. Mediastinum appears stable. Calcified plaques in the aortic arch. Mild pulmonary vascular/interstitial prominence similar to previous study. Small bilateral pleural effusions. No pneumothorax. IMPRESSION: Cardiomegaly with mild pulmonary vascular congestion and small bilateral pleural effusions. Electronically Signed   By: Ofilia Neas M.D.   On: 01/02/2022 11:37     Assessment and Plan:   1. Symptomatic bradycardia Her tele currently shows probably junctional bradycardia.  There are some Morgan waves occasionally that to not seem to be conducted.  She was recently taken off of her beta-blocker b/c of bradycardia.  She then developed atrial fibrillation with RVR and was cardioverted. She was started back on Toprol XL 25 mg once daily.  She likely has tachy-brady syndrome.  She may need a pacemaker.  Consider EP referral.  Stop beta-blocker.  External pacer.  Admit to CCU.   2. Acute on chronic (HFpEF) heart failure with preserved ejection fraction She is volume overloaded.  She also has evidence of AKI on chronic  kidney disease.  Creatinine is now 2.4.  Her congestive heart failure is probably worsened by her bradycardia.  Will gently diurese with IV Lasix.   3. Acute kidney injury on chronic kidney disease  Likely related to poor perfusion from bradycardia.  Will need to monitor renal function closely in light of need for diuresis.    4. Paroxysmal atrial fibrillation She remains on Flecainide and I do not think any of her EKGs have show atrial fibrillation here.  She has rapid rate when in atrial fibrillation.  Continue Apixaban 2.5 mg twice daily (age, creatinine).    5. Aortic insufficiency Mild on most recent echocardiogram in 02/2020.     Risk Assessment/Risk Scores:       New York Heart Association (NYHA) Functional Class NYHA Class III  CHA2DS2-VASc Score = 5   This indicates a 7.2% annual risk of stroke. The patient's score is based upon: CHF History: 1 HTN History: 1 Diabetes History: 0 Stroke History: 0 Vascular Disease History: 0 Age Score: 2 Gender Score: 1      Severity of Illness: The appropriate patient status for this patient is INPATIENT. Inpatient status is judged to be reasonable and necessary in order to provide the required intensity of service to ensure the patient's safety. The patient's presenting symptoms, physical exam findings, and initial radiographic and laboratory data in the context of their chronic comorbidities is  felt to place them at high risk for further clinical deterioration. Furthermore, it is not anticipated that the patient will be medically stable for discharge from the hospital within 2 midnights of admission.   * I certify that at the point of admission it is my clinical judgment that the patient will require inpatient hospital care spanning beyond 2 midnights from the point of admission due to high intensity of service, high risk for further deterioration and high frequency of surveillance required.*   For questions or updates, please contact  [place] Please consult [email or web address] for contact info under     Signed, Richardson Dopp, PA-C  01/02/2022 1:02 PM

## 2022-01-02 NOTE — Consult Note (Addendum)
Cardiology Consultation:   Patient ID: Christina Morgan MRN: [id number]; DOB: 02-12-1933  Admit date: 01/02/2022 Date of Consult: 01/02/2022  PCP:  Orvis Brill, [place] Providers Cardiologist:  Fransico Him, MD   {   Patient Profile:   Christina Morgan is a 88 y.o. female with a hx of persistent atrial fibrillation s/p DCCV 12/16/2021 on flecainide and apixaban, chronic diastolic heart failure and htn who is being seen 01/02/2022 for the evaluation of tachy-brady syndrome at the request of Dr Oval Linsey.  History of Present Illness:   Christina Morgan presented to the ED today with dizziness and fatigue. No loss of consciousness. In the ER, HR in the 20s-30s. Recent admissions for the same where her medications were adjusted.  Her daughter is with her today and provides much of the history.   Past Medical History:  Diagnosis Date   Cancer [facility])    endometrial ca   Chronic diastolic CHF (congestive heart failure) (HCC)    Chronic edema    CKD (chronic kidney disease), stage III (HCC)    Dyslipidemia    Fracture of left humerus    History of cardiovascular stress test    Lexiscan Myoview (06/2014): No ischemia or scar, not gated, low risk   Hx of echocardiogram    Echo (05/02/14): EF 60% to 65%. No regional wall motion abnormalities. Mild AI, mildly dilated aortic root (37 mm), trivial MR, trivial TR   Hyperlipidemia    Hypertension    Lipoma of skin    multiple   Osteopenia    PAF (paroxysmal atrial fibrillation) (HCC)    failed DCCV 05/2014 >> Flecainide started >> DCCV 7/15 sucessful;  f/u ETT neg for pro-arrhythmia >> recurrent AF/AFL >> Flecainide inc to 100 bid with repeat DCCV 9/15   Vitamin D deficiency 09/25/2019    Past Surgical History:  Procedure Laterality Date   CARDIOVERSION N/A 06/05/2014   Procedure: CARDIOVERSION;  Surgeon: Sueanne Margarita, MD;  Location: [place];  Service: Cardiovascular;  Laterality: N/A;   CARDIOVERSION N/A  07/11/2014   Procedure: CARDIOVERSION;  Surgeon: Sueanne Margarita, MD;  Location: [place];  Service: Cardiovascular;  Laterality: N/A;   CARDIOVERSION N/A 09/04/2014   Procedure: CARDIOVERSION;  Surgeon: Sueanne Margarita, MD;  Location: [place] ENDOSCOPY;  Service: Cardiovascular;  Laterality: N/A;   COLONOSCOPY  2008   COLONOSCOPY N/A 05/20/2015   Procedure: COLONOSCOPY;  Surgeon: Ronald Lobo, MD;  Location: [facility] ENDOSCOPY;  Service: Endoscopy;  Laterality: N/A;   FEMUR IM NAIL Right 09/24/2019   Procedure: INTRAMEDULLARY (IM) NAIL FEMORAL;  Surgeon: Altamese Boling, MD;  Location: [place];  Service: Orthopedics;  Laterality: Right;   IR KYPHO EA ADDL LEVEL THORACIC OR LUMBAR  04/12/2019   IR KYPHO THORACIC WITH BONE BIOPSY  04/12/2019   ROBOTIC ASSISTED SUPRACERVICAL HYSTERECTOMY WITH BILATERAL SALPINGO OOPHERECTOMY  11/06/2012   and bilateral pelvic lymph node dissection       Inpatient Medications: Scheduled Meds:  sodium chloride flush  3 mL Intravenous Q12H   Continuous Infusions:  sodium chloride     PRN Meds: sodium chloride, acetaminophen, ondansetron (ZOFRAN) IV, sodium chloride flush  Allergies:    Allergies  Allergen Reactions   Penicillins Itching and Other (See Comments)    Dizziness to the patient of passing out- told by MD to "never take this again" Did it involve swelling of the face/tongue/throat, SOB, or low BP? No Did it involve sudden or severe rash/hives, skin peeling, or any reaction  on the inside of your mouth or nose? No Did you need to seek medical attention at a hospital or doctor's office? No When did it last happen? "a long time ago"    If all above answers are "NO", may proceed with cephalosporin use.     Social History:   Social History   Socioeconomic History   Marital status: Widowed    Spouse name: Not on file   Number of children: Not on file   Years of education: Not on file   Highest education level: Not on file  Occupational History   Not on  file  Tobacco Use   Smoking status: Never   Smokeless tobacco: Never  Vaping Use   Vaping Use: Never used  Substance and Sexual Activity   Alcohol use: No   Drug use: No   Sexual activity: Not on file  Other Topics Concern   Not on file  Social History Narrative   ** Merged History Encounter **       Social Determinants of Health   Financial Resource Strain: Not on file  Food Insecurity: Not on file  Transportation Needs: Not on file  Physical Activity: Not on file  Stress: Not on file  Social Connections: Not on file  Intimate Partner Violence: Not on file    Family History:    Family History  Problem Relation Age of Onset   Cancer Father 76       metastatic oropharyngeal ca   Heart attack Mother    Coronary artery disease Mother      ROS:  Please see the history of present illness.   All other ROS reviewed and negative.     Physical Exam/Data:   Vitals:   01/02/22 1215 01/02/22 1230 01/02/22 1245 01/02/22 1300  BP: 110/82 (!) 113/55 (!) 116/54 (!) 125/56  Pulse: (!) 26 (!) 56 (!) 27 (!) 34  Resp: 17 18 16 16   Temp:      SpO2: 98% 99% 100% 99%  Weight:      Height:       No intake or output data in the 24 hours ending 01/02/22 1343 Last 3 Weights 01/02/2022 12/24/2021 11/30/2021  Weight (lbs) 176 lb 176 lb 11.2 oz 163 lb 2.3 oz  Weight (kg) 79.833 kg 80.151 kg 74 kg     Body mass index is 28.41 kg/m.   General:  Well nourished, well developed, in no acute distress. In bed at 45 degrees HEENT: normal Neck: JVD elevated to jaw Vascular: No carotid bruits; Distal pulses 2+ bilaterally Cardiac:  normal S1, S2; bradycardic; no murmur Lungs:  clear to auscultation bilaterally, no wheezing, rhonchi or rales  Abd: soft, nontender Ext: 2+ pitting bilateral LE edema Musculoskeletal:  No deformities, BUE and BLE strength normal and equal Skin: warm and dry  Neuro:  CNs 2-12 intact, no focal abnormalities noted Psych:  Normal affect   EKG:  The EKG was  personally reviewed and demonstrates:  sinus rhythm, junctional escape, bradycardia in the 20s.  Telemetry:  Telemetry was personally reviewed and demonstrates:  sinus rhythm at times. Other times appear to be sinus arreest with junctional escape in the 20s-30s.  Relevant CV Studies:  02/12/2020 echo EF60%  Laboratory Data:  High Sensitivity Troponin:   Recent Labs  Lab 12/16/21 1245 12/16/21 1424  TROPONINIHS 11 12     Chemistry Recent Labs  Lab 01/02/22 1057  NA 136  K 5.0  CL 106  CO2 25  GLUCOSE  139*  BUN 46*  CREATININE 2.42*  CALCIUM 9.1  GFRNONAA 19*  ANIONGAP 5    No results for input(s): PROT, ALBUMIN, AST, ALT, ALKPHOS, BILITOT in the last 168 hours. Lipids No results for input(s): CHOL, TRIG, HDL, LABVLDL, LDLCALC, CHOLHDL in the last 168 hours.  Hematology Recent Labs  Lab 01/02/22 1057  WBC 7.0  RBC 3.32*  HGB 10.6*  HCT 33.1*  MCV 99.7  MCH 31.9  MCHC 32.0  RDW 16.1*  PLT 189   Thyroid No results for input(s): TSH, FREET4 in the last 168 hours.  BNPNo results for input(s): BNP, PROBNP in the last 168 hours.  DDimer No results for input(s): DDIMER in the last 168 hours.   Radiology/Studies:  DG Chest Portable 1 View  Result Date: 01/02/2022 CLINICAL DATA:  Weakness EXAM: PORTABLE CHEST 1 VIEW COMPARISON:  Chest x-ray 12/15/2021 FINDINGS: Heart is enlarged. Mediastinum appears stable. Calcified plaques in the aortic arch. Mild pulmonary vascular/interstitial prominence similar to previous study. Small bilateral pleural effusions. No pneumothorax. IMPRESSION: Cardiomegaly with mild pulmonary vascular congestion and small bilateral pleural effusions. Electronically Signed   By: Ofilia Neas M.D.   On: 01/02/2022 11:37     Assessment and Plan:   Tachycardia-Bradycardia Syndrome Atrial fibrillation  Patient with multiple admissions for atrial fibrillation with RVR requiring DCCV and bradycardia. She will require a permanent pacemaker  placement to help regulate her heart rates. She will need to be more euvolemic before this can be accomplished.   Plan to diurese her now and will reassess her volume status tomorrow. If euvolemic tomorrow, we will plan to implant a PPM tomorrow. If she is still significantly volume overloaded we will wait to implant a pacemaker until later in the week. Will make a final decision in the morning. Hold tonight's dose of eliquis in case she is ready tomorrow for implant.  Echo to confirm normal EF which will guide type of PPM implant.  Stop flecainide and metoprolol.  EP to follow.   For questions or updates, please contact [place] Please consult [email or web address] for contact info under    Signed, Vickie Epley, MD  01/02/2022 1:43 PM

## 2022-01-02 NOTE — Progress Notes (Signed)
Electrophysiology Rounding Note  Patient Name: Christina Morgan Date of Encounter: 01/03/22   Primary Cardiologist: Fransico Him, MD Electrophysiologist: Sharene Skeans have both seen; f/u pending disposition.   Subjective   The patient is doing well today.  At this time, the patient denies chest pain, shortness of breath, or any new concerns.    She can only lie about 30-45 degrees due to h/o neck fracture. Sleeps in a neck brace for comfort.   Inpatient Medications    Scheduled Meds:  atorvastatin  10 mg Oral QHS   chlorhexidine  60 mL Topical Once   [START ON 01/03/2022] chlorhexidine  60 mL Topical Once   [START ON 01/03/2022] gentamicin irrigation  80 mg Irrigation On Call   [START ON 01/03/2022] letrozole  2.5 mg Oral Daily   mouth rinse  15 mL Mouth Rinse BID   nystatin  1 g Topical BID   pantoprazole  40 mg Oral Daily   sodium chloride flush  3 mL Intravenous Q12H   [START ON 01/03/2022] torsemide  10 mg Oral Q M,W,F   Continuous Infusions:  sodium chloride     sodium chloride Stopped (01/02/22 1354)   [START ON 01/03/2022] vancomycin     PRN Meds: sodium chloride, acetaminophen, albuterol, guaiFENesin, ondansetron (ZOFRAN) IV, sodium chloride flush   Vital Signs    Vitals:   01/02/22 1900 01/02/22 2000 01/02/22 2030 01/02/22 2100  BP: 117/70 (!) 130/58  (!) 124/52  Pulse: (!) 51 (!) 50  (!) 53  Resp: 14 12  15   Temp:   (!) 94.7 F (34.8 C)   TempSrc:   Rectal   SpO2: 96% 95%  97%  Weight:      Height:        Intake/Output Summary (Last 24 hours) at 01/02/2022 2134 Last data filed at 01/02/2022 2000 Gross per 24 hour  Intake 30 ml  Output 300 ml  Net -270 ml   Filed Weights   01/02/22 1047 01/02/22 1630  Weight: 79.8 kg 78.5 kg    Physical Exam    GEN- The patient is well appearing, alert and oriented x 3 today.   Head- normocephalic, atraumatic Eyes-  Sclera clear, conjunctiva pink Ears- hearing intact Oropharynx- clear Neck-  supple Lungs- Clear to ausculation bilaterally, normal work of breathing Heart- regular rate and rhythm, no murmurs, rubs or gallops GI- soft, NT, ND, + BS Extremities- no clubbing or cyanosis. No edema Skin- no rash or lesion Psych- euthymic mood, full affect Neuro- strength and sensation are intact  Labs    CBC Recent Labs    01/02/22 1057  WBC 7.0  NEUTROABS 5.7  HGB 10.6*  HCT 33.1*  MCV 99.7  PLT 297   Basic Metabolic Panel Recent Labs    01/02/22 1057  NA 136  K 5.0  CL 106  CO2 25  GLUCOSE 139*  BUN 46*  CREATININE 2.42*  CALCIUM 9.1   Liver Function Tests No results for input(s): AST, ALT, ALKPHOS, BILITOT, PROT, ALBUMIN in the last 72 hours. No results for input(s): LIPASE, AMYLASE in the last 72 hours. Cardiac Enzymes No results for input(s): CKTOTAL, CKMB, CKMBINDEX, TROPONINI in the last 72 hours.   Telemetry    Sinus bradycardia 50s this am (personally reviewed)  Radiology    DG Chest Portable 1 View  Result Date: 01/02/2022 CLINICAL DATA:  Weakness EXAM: PORTABLE CHEST 1 VIEW COMPARISON:  Chest x-ray 12/15/2021 FINDINGS: Heart is enlarged. Mediastinum appears stable. Calcified plaques  in the aortic arch. Mild pulmonary vascular/interstitial prominence similar to previous study. Small bilateral pleural effusions. No pneumothorax. IMPRESSION: Cardiomegaly with mild pulmonary vascular congestion and small bilateral pleural effusions. Electronically Signed   By: Ofilia Neas M.D.   On: 01/02/2022 11:37    Patient Profile     Christina Morgan is a 88 y.o. female with a hx of persistent atrial fibrillation s/p DCCV 12/16/2021 on flecainide and apixaban, chronic diastolic heart failure and htn who is being seen 01/02/2022 for the evaluation of tachy-brady syndrome at the request of Dr Oval Linsey.  Assessment & Plan    Tachycardia-Bradycardia Syndrome Atrial fibrillation   Patient with multiple admissions for atrial fibrillation with RVR requiring  DCCV and bradycardia.   Will require PPM for rate control pending euvolemia.   Volume status today remains elevated. Repeat 20 mg lasix this am and follow response.   Echo also pending  Off flecainide/metoprol.    3. AKI Cr 2.42 -> 2.36 Baseline appears 1.7 - 1.9   For questions or updates, please contact [place] Please consult [email or web address] for contact info under Cardiology/STEMI.  Signed, Shirley Friar, PA-C  01/02/2022, 9:34 PM

## 2022-01-02 NOTE — Plan of Care (Signed)
  Problem: Education: Goal: Knowledge of General Education information will improve Description: Including pain rating scale, medication(s)/side effects and non-pharmacologic comfort measures Outcome: Progressing   Problem: Nutrition: Goal: Adequate nutrition will be maintained Outcome: Progressing   

## 2022-01-03 ENCOUNTER — Other Ambulatory Visit: Payer: Self-pay

## 2022-01-03 ENCOUNTER — Inpatient Hospital Stay (HOSPITAL_COMMUNITY): Payer: Medicare Other

## 2022-01-03 ENCOUNTER — Other Ambulatory Visit: Payer: Self-pay | Admitting: Student

## 2022-01-03 DIAGNOSIS — U071 COVID-19: Secondary | ICD-10-CM

## 2022-01-03 DIAGNOSIS — I495 Sick sinus syndrome: Secondary | ICD-10-CM | POA: Diagnosis not present

## 2022-01-03 DIAGNOSIS — I480 Paroxysmal atrial fibrillation: Secondary | ICD-10-CM | POA: Diagnosis not present

## 2022-01-03 DIAGNOSIS — I5033 Acute on chronic diastolic (congestive) heart failure: Secondary | ICD-10-CM | POA: Diagnosis not present

## 2022-01-03 DIAGNOSIS — R001 Bradycardia, unspecified: Secondary | ICD-10-CM | POA: Diagnosis not present

## 2022-01-03 DIAGNOSIS — R9431 Abnormal electrocardiogram [ECG] [EKG]: Secondary | ICD-10-CM

## 2022-01-03 LAB — BASIC METABOLIC PANEL
Anion gap: 7 (ref 5–15)
BUN: 51 mg/dL — ABNORMAL HIGH (ref 8–23)
CO2: 23 mmol/L (ref 22–32)
Calcium: 9.2 mg/dL (ref 8.9–10.3)
Chloride: 107 mmol/L (ref 98–111)
Creatinine, Ser: 2.36 mg/dL — ABNORMAL HIGH (ref 0.44–1.00)
GFR, Estimated: 20 mL/min — ABNORMAL LOW (ref >60)
Glucose, Bld: 84 mg/dL (ref 70–99)
Potassium: 4.8 mmol/L (ref 3.5–5.1)
Sodium: 137 mmol/L (ref 135–145)

## 2022-01-03 LAB — ECHOCARDIOGRAM COMPLETE
AR max vel: 1.85 cm2
AV Area VTI: 1.74 cm2
AV Area mean vel: 1.66 cm2
AV Mean grad: 6 mmHg
AV Peak grad: 12.4 mmHg
Ao pk vel: 1.76 m/s
Area-P 1/2: 4.8 cm2
Calc EF: 65.7 %
Height: 66 in
P 1/2 time: 426 msec
S' Lateral: 3 cm
Single Plane A2C EF: 64.6 %
Single Plane A4C EF: 66.9 %
Weight: 2814.83 oz

## 2022-01-03 LAB — CBC
HCT: 30.2 % — ABNORMAL LOW (ref 36.0–46.0)
Hemoglobin: 9.5 g/dL — ABNORMAL LOW (ref 12.0–15.0)
MCH: 30.8 pg (ref 26.0–34.0)
MCHC: 31.5 g/dL (ref 30.0–36.0)
MCV: 98.1 fL (ref 80.0–100.0)
Platelets: 173 10*3/uL (ref 150–400)
RBC: 3.08 MIL/uL — ABNORMAL LOW (ref 3.87–5.11)
RDW: 15.9 % — ABNORMAL HIGH (ref 11.5–15.5)
WBC: 5.2 10*3/uL (ref 4.0–10.5)
nRBC: 0 % (ref 0.0–0.2)

## 2022-01-03 LAB — BRAIN NATRIURETIC PEPTIDE: B Natriuretic Peptide: 544.3 pg/mL — ABNORMAL HIGH (ref 0.0–100.0)

## 2022-01-03 LAB — C-REACTIVE PROTEIN: CRP: 1.6 mg/dL — ABNORMAL HIGH (ref <1.0)

## 2022-01-03 LAB — MAGNESIUM: Magnesium: 2.4 mg/dL (ref 1.7–2.4)

## 2022-01-03 LAB — FERRITIN: Ferritin: 133 ng/mL (ref 11–307)

## 2022-01-03 LAB — PHOSPHORUS: Phosphorus: 4.8 mg/dL — ABNORMAL HIGH (ref 2.5–4.6)

## 2022-01-03 LAB — TSH: TSH: 5.147 u[IU]/mL — ABNORMAL HIGH (ref 0.350–4.500)

## 2022-01-03 MED ORDER — PERFLUTREN LIPID MICROSPHERE
1.0000 mL | INTRAVENOUS | Status: AC | PRN
  Administered 2022-01-03: 2 mL via INTRAVENOUS
  Filled 2022-01-03: qty 10

## 2022-01-03 MED ORDER — FUROSEMIDE 10 MG/ML IJ SOLN
40.0000 mg | Freq: Once | INTRAMUSCULAR | Status: AC
  Administered 2022-01-03: 40 mg via INTRAVENOUS
  Filled 2022-01-03: qty 4

## 2022-01-03 MED ORDER — FUROSEMIDE 10 MG/ML IJ SOLN
20.0000 mg | Freq: Once | INTRAMUSCULAR | Status: AC
  Administered 2022-01-03: 20 mg via INTRAVENOUS
  Filled 2022-01-03: qty 2

## 2022-01-03 MED ORDER — FUROSEMIDE 10 MG/ML IJ SOLN: 40.0000 mg | Freq: Once | INTRAMUSCULAR | Status: DC

## 2022-01-03 NOTE — Progress Notes (Signed)
Progress Note  Patient Name: Christina Morgan Date of Encounter: 01/03/2022  [facility] Cardiologist: Fransico Him, MD   Subjective   She feels well with 2L O2. No chest pain. She cannot lie flat comfortable 2/2 neck pain  Inpatient Medications    Scheduled Meds:  atorvastatin  10 mg Oral QHS   Chlorhexidine Gluconate Cloth  6 each Topical Daily   gentamicin irrigation  80 mg Irrigation On Call   letrozole  2.5 mg Oral Daily   mouth rinse  15 mL Mouth Rinse BID   nystatin  1 g Topical BID   pantoprazole  40 mg Oral Daily   sodium chloride flush  3 mL Intravenous Q12H   torsemide  10 mg Oral Q M,W,F   Continuous Infusions:  sodium chloride     sodium chloride Stopped (01/02/22 1354)   vancomycin     PRN Meds: sodium chloride, acetaminophen, albuterol, guaiFENesin, ondansetron (ZOFRAN) IV, sodium chloride flush   Vital Signs    Vitals:   01/03/22 0800 01/03/22 0900 01/03/22 1000 01/03/22 1100  BP: 124/63 126/61 (!) 129/58 130/78  Pulse: (!) 56 (!) 56 (!) 58 (!) 59  Resp: 12 15 16 17   Temp: 97.6 F (36.4 C)   97.6 F (36.4 C)  TempSrc: Oral   Oral  SpO2: 97% 97% 96% 97%  Weight:      Height:        Intake/Output Summary (Last 24 hours) at 01/03/2022 1213 Last data filed at 01/03/2022 0359 Gross per 24 hour  Intake 150 ml  Output 550 ml  Net -400 ml   Last 3 Weights 01/03/2022 01/02/2022 01/02/2022  Weight (lbs) 175 lb 14.8 oz 173 lb 1 oz 176 lb  Weight (kg) 79.8 kg 78.5 kg 79.833 kg      Telemetry    Sinus bradycardia hr 59 bpm . Significant 1st degree AV block PR 272 ms, IVCD LBBB morphology- Personally Reviewed  ECG    No new - Personally Reviewed   Physical Exam   Vitals:   01/03/22 1000 01/03/22 1100  BP: (!) 129/58 130/78  Pulse: (!) 58 (!) 59  Resp: 16 17  Temp:  97.6 F (36.4 C)  SpO2: 96% 97%     GEN: No acute distress.  On 2L o2 Neck: No JVD Cardiac: RRR, no murmurs, rubs, or gallops.  Respiratory: nl wob, mild decrease  in the bases GI: non-distended  MS: trace to 1+ BL LE edema Neuro:  Nonfocal  Psych: Normal affect   Labs    High Sensitivity Troponin:   Recent Labs  Lab 12/16/21 1245 12/16/21 1424  TROPONINIHS 11 12     Chemistry Recent Labs  Lab 01/02/22 1057 01/03/22 0308  NA 136 137  K 5.0 4.8  CL 106 107  CO2 25 23  GLUCOSE 139* 84  BUN 46* 51*  CREATININE 2.42* 2.36*  CALCIUM 9.1 9.2  MG  --  2.4  GFRNONAA 19* 20*  ANIONGAP 5 7    Lipids No results for input(s): CHOL, TRIG, HDL, LABVLDL, LDLCALC, CHOLHDL in the last 168 hours.  Hematology Recent Labs  Lab 01/02/22 1057 01/03/22 0308  WBC 7.0 5.2  RBC 3.32* 3.08*  HGB 10.6* 9.5*  HCT 33.1* 30.2*  MCV 99.7 98.1  MCH 31.9 30.8  MCHC 32.0 31.5  RDW 16.1* 15.9*  PLT 189 173   Thyroid  Recent Labs  Lab 01/03/22 0308  TSH 5.147*    BNPNo results for input(s): BNP,  PROBNP in the last 168 hours.  DDimer No results for input(s): DDIMER in the last 168 hours.   Radiology    DG Chest Portable 1 View  Result Date: 01/02/2022 CLINICAL DATA:  Weakness EXAM: PORTABLE CHEST 1 VIEW COMPARISON:  Chest x-ray 12/15/2021 FINDINGS: Heart is enlarged. Mediastinum appears stable. Calcified plaques in the aortic arch. Mild pulmonary vascular/interstitial prominence similar to previous study. Small bilateral pleural effusions. No pneumothorax. IMPRESSION: Cardiomegaly with mild pulmonary vascular congestion and small bilateral pleural effusions. Electronically Signed   By: Ofilia Neas M.D.   On: 01/02/2022 11:37    Cardiac Studies   Pending echo  Patient Profile     Christina Morgan is a 88 y.o. female with with a hx of persistent atrial fibrillation (Apixaban, Flecainide), HFpEF, hypertension, hyperlipidemia, aortic insufficiency, chronic kidney disease, symptomatic bradycardia who is being seen 01/02/2022 for the evaluation of bradycardia at the request of Dr. Alvino Chapel, planned for Ppm after diuresis  Assessment & Plan     #SSS/Tachybrady:   # PAF:  It seems that she has sick sinus syndrome with episodes of atrial fibrillation with RVR and bradycardia when in sinus rhythm.  She was previously on atenolol, which was switched to metoprolol due to bradycardia.  The dose was reduced to 25mg .  She presented to the hospital via EMS with heart rates in the 20s.  She was short of breath and weak.  She was given atropine with improvement in her heart rate.  She was in the 30s when evaluated and stable. She's pending echo.  - sinus rhythm, profound 1st degree AV block  - can keep pacer pads on for now - see EP note for Ppm planning    # COVID-19:  She was incidentally noted to have COVID-19 on admission.  She denies any respiratory symptoms. Is on 2L O2 satting in the high 90s. No sig symptoms. PCR + 01/02/22. Cxray showed mild interstitial edema, small pleural effusions  # Acute on chronic diastolic heart failure: Gentle diuresis given her hypotension and bradycardia.  She is volume overloaded on exam still on O2 -Diuresing pretty well, redosed lasix IV 40 for this afternoon -K 4.8 - If weaned off O2, can aim to transition to oral soon (Crt 1.6-1.9) 2.42->2.36  For questions or updates, please contact [place] Please consult [email or web address] for contact info under        Signed, Janina Mayo, MD  01/03/2022, 12:13 PM

## 2022-01-03 NOTE — Progress Notes (Signed)
Orthopedic Tech Progress Note Patient Details:  Christina Morgan 03/25/1933 [id number]  Ortho Devices Type of Ortho Device: Soft collar Ortho Device/Splint Interventions: Ordered  Delivered collar to RN    Karolee Stamps 01/03/2022, 4:19 AM

## 2022-01-03 NOTE — Progress Notes (Signed)
Received copy via fax machine of patient's positive COVID test at her nursing facility. The test was positive on 12/10/2021. Per protocol, airborne and droplet precautions have been discontinued since patient is without any symptoms.

## 2022-01-03 NOTE — Plan of Care (Signed)
  Problem: Education: Goal: Knowledge of General Education information will improve Description: Including pain rating scale, medication(s)/side effects and non-pharmacologic comfort measures Outcome: Progressing   Problem: Health Behavior/Discharge Planning: Goal: Ability to manage health-related needs will improve Outcome: Progressing   Problem: Clinical Measurements: Goal: Ability to maintain clinical measurements within normal limits will improve Outcome: Progressing Goal: Will remain free from infection Outcome: Progressing Goal: Respiratory complications will improve Outcome: Progressing Goal: Cardiovascular complication will be avoided Outcome: Progressing   Problem: Activity: Goal: Risk for activity intolerance will decrease Outcome: Progressing   Problem: Nutrition: Goal: Adequate nutrition will be maintained Outcome: Progressing   Problem: Coping: Goal: Level of anxiety will decrease Outcome: Progressing   

## 2022-01-03 NOTE — Progress Notes (Addendum)
Electrophysiology Rounding Note  Patient Name: Christina Morgan Date of Encounter: 01/04/22   Primary Cardiologist: Fransico Him, MD Electrophysiologist: Dr. Curt Bears   Subjective   The patient is doing well today. Breathing is much better. Able to lie more flat, neck pain notwithstanding.   Inpatient Medications    Scheduled Meds:  atorvastatin  10 mg Oral QHS   chlorhexidine  60 mL Topical Once   Chlorhexidine Gluconate Cloth  6 each Topical Daily   gentamicin irrigation  80 mg Irrigation On Call   letrozole  2.5 mg Oral Daily   mouth rinse  15 mL Mouth Rinse BID   nystatin  1 g Topical BID   pantoprazole  40 mg Oral Daily   sodium chloride flush  3 mL Intravenous Q12H   torsemide  10 mg Oral Q M,W,F   Continuous Infusions:  sodium chloride     sodium chloride Stopped (01/02/22 1354)   sodium chloride     sodium chloride     vancomycin     PRN Meds: sodium chloride, acetaminophen, albuterol, guaiFENesin, ondansetron (ZOFRAN) IV, sodium chloride flush   Vital Signs    Vitals:   01/04/22 0600 01/04/22 0700 01/04/22 0751 01/04/22 0800  BP: 126/62 (!) 154/71  (!) 145/97  Pulse: 67 60 66 64  Resp: 13 12 17 13   Temp:   (!) 97.5 F (36.4 C)   TempSrc:   Oral   SpO2: 93% 98% 98% 98%  Weight:      Height:        Intake/Output Summary (Last 24 hours) at 01/04/2022 0906 Last data filed at 01/04/2022 0353 Gross per 24 hour  Intake --  Output 2000 ml  Net -2000 ml   Filed Weights   01/02/22 1630 01/03/22 0346 01/04/22 0353  Weight: 78.5 kg 79.8 kg 78.5 kg    Physical Exam    GEN- The patient is well appearing, alert and oriented x 3 today.   Head- normocephalic, atraumatic Eyes-  Sclera clear, conjunctiva pink Ears- hearing intact Oropharynx- clear Neck- supple Lungs- Clear to ausculation bilaterally, normal work of breathing Heart- Regular rate and rhythm, no murmurs, rubs or gallops GI- soft, NT, ND, + BS Extremities- no clubbing or cyanosis. No  edema Skin- no rash or lesion Psych- euthymic mood, full affect Neuro- strength and sensation are intact  Labs    CBC Recent Labs    01/02/22 1057 01/03/22 0308  WBC 7.0 5.2  NEUTROABS 5.7  --   HGB 10.6* 9.5*  HCT 33.1* 30.2*  MCV 99.7 98.1  PLT 189 245   Basic Metabolic Panel Recent Labs    01/03/22 0308 01/04/22 0121  NA 137 141  K 4.8 4.2  CL 107 105  CO2 23 26  GLUCOSE 84 81  BUN 51* 46*  CREATININE 2.36* 2.45*  CALCIUM 9.2 9.3  MG 2.4  --   PHOS 4.8*  --    Liver Function Tests No results for input(s): AST, ALT, ALKPHOS, BILITOT, PROT, ALBUMIN in the last 72 hours. No results for input(s): LIPASE, AMYLASE in the last 72 hours. Cardiac Enzymes No results for input(s): CKTOTAL, CKMB, CKMBINDEX, TROPONINI in the last 72 hours.   Telemetry    NSR 60s (personally reviewed)  Radiology    DG Chest Portable 1 View  Result Date: 01/02/2022 CLINICAL DATA:  Weakness EXAM: PORTABLE CHEST 1 VIEW COMPARISON:  Chest x-ray 12/15/2021 FINDINGS: Heart is enlarged. Mediastinum appears stable. Calcified plaques in the aortic arch. Mild  pulmonary vascular/interstitial prominence similar to previous study. Small bilateral pleural effusions. No pneumothorax. IMPRESSION: Cardiomegaly with mild pulmonary vascular congestion and small bilateral pleural effusions. Electronically Signed   By: Ofilia Neas M.D.   On: 01/02/2022 11:37   ECHOCARDIOGRAM COMPLETE  Result Date: 01/03/2022    ECHOCARDIOGRAM REPORT   Patient Name:   Christina Morgan Date of Exam: 01/03/2022 Medical Rec #:  [id number]           Height:       66.0 in Accession #:    [id number]          Weight:       175.9 lb Date of Birth:  1933-04-04            BSA:          1.894 m Patient Age:    88 years            BP:           128/57 mmHg Patient Gender: F                   HR:           61 bpm. Exam Location:  Inpatient Procedure: 2D Echo, Cardiac Doppler, Color Doppler and Intracardiac            Opacification  Agent Indications:    [id number] Abnormal EKG  History:        Patient has prior history of Echocardiogram examinations, most                 recent 02/12/2020. CHF, Abnormal ECG, Arrythmias:Atrial                 Fibrillation and Bradycardia, Signs/Symptoms:Shortness of                 Breath, Dyspnea and Bacteremia; Risk Factors:Hypertension and                 Dyslipidemia. Cancer.  Sonographer:    Roseanna Rainbow RDCS Referring Phys: [id number] Hilton [facility]  Sonographer Comments: Technically difficult study due to poor echo windows. Image acquisition challenging due to patient body habitus. IMPRESSIONS  1. Abnormal septal motion. Left ventricular ejection fraction, by estimation, is 55 to 60%. The left ventricle has normal function. The left ventricle has no regional wall motion abnormalities. There is mild left ventricular hypertrophy. Left ventricular diastolic parameters are indeterminate.  2. Right ventricular systolic function is moderately reduced. The right ventricular size is moderately enlarged. There is moderately elevated pulmonary artery systolic pressure.  3. The mitral valve is degenerative. Trivial mitral valve regurgitation. No evidence of mitral stenosis.  4. AV severely thickened and calcified now with mild stenosis compared to TTE done 02/12/20 There contiues to also be some shadowing artifact in LVOT doubt vegetation and was present on TTE 2021 . The aortic valve is tricuspid. There is severe calcifcation of the aortic valve. There is severe thickening of the aortic valve. Aortic valve regurgitation is mild to moderate. Mild aortic valve stenosis.  5. Aortic dilatation noted. There is mild dilatation of the ascending aorta, measuring 39 mm.  6. The inferior vena cava is normal in size with greater than 50% respiratory variability, suggesting right atrial pressure of 3 mmHg. FINDINGS  Left Ventricle: Abnormal septal motion. Left ventricular ejection fraction, by estimation, is 55 to 60%. The left  ventricle has normal function. The left ventricle has no regional wall motion abnormalities. Definity contrast agent was  given IV to delineate the left ventricular endocardial borders. The left ventricular internal cavity size was normal in size. There is mild left ventricular hypertrophy. Left ventricular diastolic parameters are indeterminate. Right Ventricle: The right ventricular size is moderately enlarged. Right vetricular wall thickness was not assessed. Right ventricular systolic function is moderately reduced. There is moderately elevated pulmonary artery systolic pressure. The tricuspid regurgitant velocity is 3.32 m/s, and with an assumed right atrial pressure of 15 mmHg, the estimated right ventricular systolic pressure is 57.3 mmHg. Left Atrium: Left atrial size was normal in size. Right Atrium: Right atrial size was normal in size. Pericardium: There is no evidence of pericardial effusion. Mitral Valve: The mitral valve is degenerative in appearance. There is moderate thickening of the mitral valve leaflet(s). There is moderate calcification of the mitral valve leaflet(s). Trivial mitral valve regurgitation. No evidence of mitral valve stenosis. Tricuspid Valve: The tricuspid valve is normal in structure. Tricuspid valve regurgitation is mild . No evidence of tricuspid stenosis. Aortic Valve: AV severely thickened and calcified now with mild stenosis compared to TTE done 02/12/20 There contiues to also be some shadowing artifact in LVOT doubt vegetation and was present on TTE 2021. The aortic valve is tricuspid. There is severe calcifcation of the aortic valve. There is severe thickening of the aortic valve. Aortic valve regurgitation is mild to moderate. Aortic regurgitation PHT measures 426 msec. Mild aortic stenosis is present. Aortic valve mean gradient measures 6.0 mmHg. Aortic valve peak gradient measures 12.4 mmHg. Aortic valve area, by VTI measures 1.74 cm. Pulmonic Valve: The pulmonic valve was  normal in structure. Pulmonic valve regurgitation is mild. No evidence of pulmonic stenosis. Aorta: Aortic dilatation noted. There is mild dilatation of the ascending aorta, measuring 39 mm. Venous: The inferior vena cava is normal in size with greater than 50% respiratory variability, suggesting right atrial pressure of 3 mmHg. IAS/Shunts: No atrial level shunt detected by color flow Doppler.  LEFT VENTRICLE PLAX 2D LVIDd:         4.60 cm      Diastology LVIDs:         3.00 cm      LV e' medial:    7.01 cm/s LV PW:         1.10 cm      LV E/e' medial:  19.0 LV IVS:        1.20 cm      LV e' lateral:   7.76 cm/s LVOT diam:     1.80 cm      LV E/e' lateral: 17.1 LV SV:         69 LV SV Index:   36 LVOT Area:     2.54 cm  LV Volumes (MOD) LV vol d, MOD A2C: 113.0 ml LV vol d, MOD A4C: 103.6 ml LV vol s, MOD A2C: 40.0 ml LV vol s, MOD A4C: 34.3 ml LV SV MOD A2C:     73.0 ml LV SV MOD A4C:     103.6 ml LV SV MOD BP:      68.8 ml RIGHT VENTRICLE             IVC RV S prime:     17.70 cm/s  IVC diam: 2.00 cm TAPSE (M-mode): 2.8 cm LEFT ATRIUM             Index        RIGHT ATRIUM           Index LA diam:  2.90 cm 1.53 cm/m   RA Area:     16.90 cm LA Vol (A2C):   47.7 ml 25.19 ml/m  RA Volume:   42.50 ml  22.44 ml/m LA Vol (A4C):   70.1 ml 37.02 ml/m LA Biplane Vol: 61.0 ml 32.21 ml/m  AORTIC VALVE                     PULMONIC VALVE AV Area (Vmax):    1.85 cm      PR End Diast Vel: 1.75 msec AV Area (Vmean):   1.66 cm AV Area (VTI):     1.74 cm AV Vmax:           176.00 cm/s AV Vmean:          115.000 cm/s AV VTI:            0.396 m AV Peak Grad:      12.4 mmHg AV Mean Grad:      6.0 mmHg LVOT Vmax:         128.00 cm/s LVOT Vmean:        75.100 cm/s LVOT VTI:          0.271 m LVOT/AV VTI ratio: 0.68 AI PHT:            426 msec  AORTA Ao Root diam: 3.60 cm Ao Asc diam:  3.90 cm MITRAL VALVE                TRICUSPID VALVE MV Area (PHT): 4.80 cm     TR Peak grad:   44.1 mmHg MV Decel Time: 158 msec     TR  Vmax:        332.00 cm/s MV E velocity: 133.00 cm/s MV A velocity: 71.00 cm/s   SHUNTS MV E/A ratio:  1.87         Systemic VTI:  0.27 m                             Systemic Diam: 1.80 cm Jenkins Rouge MD Electronically signed by Jenkins Rouge MD Signature Date/Time: 01/03/2022/3:31:04 PM    Final     Patient Profile     MERCADEZ HEITMAN is a 88 y.o. female with a hx of persistent atrial fibrillation s/p DCCV 12/16/2021 on flecainide and apixaban, chronic diastolic heart failure and htn who is being seen 01/02/2022 for the evaluation of tachy-brady syndrome at the request of Dr Oval Linsey.  Assessment & Plan    Tachycardia-Bradycardia Syndrome Atrial fibrillation   Patient with multiple admissions for atrial fibrillation with RVR requiring DCCV and bradycardia.    Devoiry Corriher require PPM for rate control pending euvolemia.   Echo 01/03/22 EF 55-60%  Volume status much improved.    Off flecainide/metoprol.    3. AKI Cr 2.42 -> 2.36 -> 2.45 Baseline appears 1.7 - 1.9 Hydrate gently for pacemaker.     4. COVID-19:  Residual positive.    5. Acute on chronic diastolic heart failure: Volume status improved with Cr back up slightly.  Hydrate gently for PPM.    For questions or updates, please contact [place] Please consult [email or web address] for contact info under Cardiology/STEMI.  Signed, Shirley Friar, PA-C  01/04/2022, 9:06 AM    I have seen and examined this patient with Oda Kilts.  Agree with above, note added to reflect my findings.  Patient with tachybradycardia syndrome.  She presented with significant bradycardia on metoprolol  and flecainide.  She is also had multiple tachycardic events in atrial fibrillation.  Feeling well today without complaint.  GEN: Well nourished, well developed, in no acute distress  HEENT: normal  Neck: no JVD, carotid bruits, or masses Cardiac: RRR; no murmurs, rubs, or gallops,no edema  Respiratory:  clear to auscultation bilaterally,  normal work of breathing GI: soft, nontender, nondistended, + BS MS: no deformity or atrophy  Skin: warm and dry Neuro:  Strength and sensation are intact Psych: euthymic mood, full affect   Tachybradycardia syndrome with atrial fibrillation: Patient is in sinus rhythm today.  She does have a history of significant bradycardia on beta-blockers.  She has some lower extremity redness and swelling.  I discussed with patient and family options including pacemaker implant versus amiodarone.  They like to avoid invasive procedures for now.  We Arionna Hoggard plan for amiodarone 200 mg twice daily for 1 month followed by 200 mg daily.  We Santoria Chason arrange for follow-up in EP clinic.  Delwyn Scoggin M. Yisell Sprunger MD 01/04/2022 3:03 PM

## 2022-01-04 ENCOUNTER — Encounter (HOSPITAL_COMMUNITY): Payer: Medicare Other

## 2022-01-04 ENCOUNTER — Encounter (HOSPITAL_COMMUNITY)
Admission: EM | Disposition: A | Discharge status: Nursing Home (Includes ICF) | Payer: Self-pay | Source: Home / Self Care | Attending: Cardiovascular Disease

## 2022-01-04 DIAGNOSIS — I480 Paroxysmal atrial fibrillation: Secondary | ICD-10-CM | POA: Diagnosis not present

## 2022-01-04 DIAGNOSIS — I495 Sick sinus syndrome: Secondary | ICD-10-CM | POA: Diagnosis not present

## 2022-01-04 DIAGNOSIS — I5033 Acute on chronic diastolic (congestive) heart failure: Secondary | ICD-10-CM | POA: Diagnosis not present

## 2022-01-04 DIAGNOSIS — U071 COVID-19: Secondary | ICD-10-CM | POA: Diagnosis not present

## 2022-01-04 LAB — BASIC METABOLIC PANEL
Anion gap: 10 (ref 5–15)
BUN: 46 mg/dL — ABNORMAL HIGH (ref 8–23)
CO2: 26 mmol/L (ref 22–32)
Calcium: 9.3 mg/dL (ref 8.9–10.3)
Chloride: 105 mmol/L (ref 98–111)
Creatinine, Ser: 2.45 mg/dL — ABNORMAL HIGH (ref 0.44–1.00)
GFR, Estimated: 19 mL/min — ABNORMAL LOW (ref >60)
Glucose, Bld: 81 mg/dL (ref 70–99)
Potassium: 4.2 mmol/L (ref 3.5–5.1)
Sodium: 141 mmol/L (ref 135–145)

## 2022-01-04 LAB — T4, FREE: Free T4: 0.93 ng/dL (ref 0.61–1.12)

## 2022-01-04 SURGERY — PACEMAKER IMPLANT

## 2022-01-04 MED ORDER — SODIUM CHLORIDE 0.9 % IV SOLN: INTRAVENOUS | Status: DC

## 2022-01-04 MED ORDER — SODIUM CHLORIDE 0.9 % IV SOLN
80.0000 mg | INTRAVENOUS | Status: AC
  Filled 2022-01-04: qty 2

## 2022-01-04 MED ORDER — CEPHALEXIN 500 MG PO CAPS: 500.0000 mg | ORAL_CAPSULE | Freq: Two times a day (BID) | ORAL | Status: DC

## 2022-01-04 MED ORDER — CEPHALEXIN 500 MG PO CAPS
500.0000 mg | ORAL_CAPSULE | Freq: Two times a day (BID) | ORAL | Status: DC
  Administered 2022-01-04 – 2022-01-05 (×3): 500 mg via ORAL
  Filled 2022-01-04 (×4): qty 1

## 2022-01-04 MED ORDER — CHLORHEXIDINE GLUCONATE 4 % EX LIQD
60.0000 mL | Freq: Once | CUTANEOUS | Status: DC
  Filled 2022-01-04: qty 60

## 2022-01-04 MED ORDER — VANCOMYCIN HCL IN DEXTROSE 1-5 GM/200ML-% IV SOLN
1000.0000 mg | INTRAVENOUS | Status: AC
  Filled 2022-01-04: qty 200

## 2022-01-04 MED ORDER — AMIODARONE HCL 200 MG PO TABS
200.0000 mg | ORAL_TABLET | Freq: Two times a day (BID) | ORAL | Status: DC
  Administered 2022-01-04 – 2022-01-05 (×3): 200 mg via ORAL
  Filled 2022-01-04 (×3): qty 1

## 2022-01-04 MED ORDER — CHLORHEXIDINE GLUCONATE 4 % EX LIQD
60.0000 mL | Freq: Once | CUTANEOUS | Status: AC
  Filled 2022-01-04: qty 60

## 2022-01-04 NOTE — Progress Notes (Signed)
Progress Note  Patient Name: Christina Morgan Date of Encounter: 01/04/2022  [facility] Cardiologist: Fransico Him, MD   Subjective   She feels well today. No shortness of breath. She notes her right leg has been swollen since radiation in the past, her daughter says it looks more swollen.    Inpatient Medications    Scheduled Meds:  atorvastatin  10 mg Oral QHS   chlorhexidine  60 mL Topical Once   Chlorhexidine Gluconate Cloth  6 each Topical Daily   gentamicin irrigation  80 mg Irrigation On Call   letrozole  2.5 mg Oral Daily   mouth rinse  15 mL Mouth Rinse BID   nystatin  1 g Topical BID   pantoprazole  40 mg Oral Daily   sodium chloride flush  3 mL Intravenous Q12H   torsemide  10 mg Oral Q M,W,F   Continuous Infusions:  sodium chloride     sodium chloride Stopped (01/02/22 1354)   sodium chloride     sodium chloride     vancomycin     PRN Meds: sodium chloride, acetaminophen, albuterol, guaiFENesin, ondansetron (ZOFRAN) IV, sodium chloride flush   Vital Signs    Vitals:   01/04/22 0700 01/04/22 0751 01/04/22 0800 01/04/22 0900  BP: (!) 154/71  (!) 145/97 139/63  Pulse: 60 66 64 65  Resp: 12 17 13  (!) 21  Temp:  (!) 97.5 F (36.4 C)    TempSrc:  Oral    SpO2: 98% 98% 98% 98%  Weight:      Height:        Intake/Output Summary (Last 24 hours) at 01/04/2022 1013 Last data filed at 01/04/2022 0353 Gross per 24 hour  Intake --  Output 2000 ml  Net -2000 ml   Last 3 Weights 01/04/2022 01/03/2022 01/02/2022  Weight (lbs) 173 lb 1 oz 175 lb 14.8 oz 173 lb 1 oz  Weight (kg) 78.5 kg 79.8 kg 78.5 kg      Telemetry    Sinus rhythm Personally Reviewed  ECG    Sinus bradycardia hr 68 bpm . Significant 1st degree AV block PR 246 ms, IVCD LBBB morphology- - Personally Reviewed   Physical Exam   Vitals:   01/04/22 0800 01/04/22 0900  BP: (!) 145/97 139/63  Pulse: 64 65  Resp: 13 (!) 21  Temp:    SpO2: 98% 98%     GEN: No acute distress.   On 2L o2 satting in the high 90s Neck: No JVD. Has neck brace Cardiac: RRR, no murmurs, rubs, or gallops.  Respiratory: nl wob, mild decrease in the bases GI: non-distended  MS: R>L swelling, erythema and warmth; no sig pitting edema Neuro:  Nonfocal  Psych: Normal affect   Labs    High Sensitivity Troponin:   Recent Labs  Lab 12/16/21 1245 12/16/21 1424  TROPONINIHS 11 12     Chemistry Recent Labs  Lab 01/02/22 1057 01/03/22 0308 01/04/22 0121  NA 136 137 141  K 5.0 4.8 4.2  CL 106 107 105  CO2 25 23 26   GLUCOSE 139* 84 81  BUN 46* 51* 46*  CREATININE 2.42* 2.36* 2.45*  CALCIUM 9.1 9.2 9.3  MG  --  2.4  --   GFRNONAA 19* 20* 19*  ANIONGAP 5 7 10     Lipids No results for input(s): CHOL, TRIG, HDL, LABVLDL, LDLCALC, CHOLHDL in the last 168 hours.  Hematology Recent Labs  Lab 01/02/22 1057 01/03/22 0308  WBC 7.0 5.2  RBC 3.32* 3.08*  HGB 10.6* 9.5*  HCT 33.1* 30.2*  MCV 99.7 98.1  MCH 31.9 30.8  MCHC 32.0 31.5  RDW 16.1* 15.9*  PLT 189 173   Thyroid  Recent Labs  Lab 01/03/22 0308 01/04/22 0121  TSH 5.147*  --   FREET4  --  0.93    BNP Recent Labs  Lab 01/03/22 0308  BNP 544.3*    DDimer No results for input(s): DDIMER in the last 168 hours.   Radiology    DG Chest Portable 1 View  Result Date: 01/02/2022 CLINICAL DATA:  Weakness EXAM: PORTABLE CHEST 1 VIEW COMPARISON:  Chest x-ray 12/15/2021 FINDINGS: Heart is enlarged. Mediastinum appears stable. Calcified plaques in the aortic arch. Mild pulmonary vascular/interstitial prominence similar to previous study. Small bilateral pleural effusions. No pneumothorax. IMPRESSION: Cardiomegaly with mild pulmonary vascular congestion and small bilateral pleural effusions. Electronically Signed   By: Ofilia Neas M.D.   On: 01/02/2022 11:37   ECHOCARDIOGRAM COMPLETE  Result Date: 01/03/2022    ECHOCARDIOGRAM REPORT   Patient Name:   Christina Morgan Date of Exam: 01/03/2022 Medical Rec #:   [id number]           Height:       66.0 in Accession #:    [id number]          Weight:       175.9 lb Date of Birth:  March 13, 1933            BSA:          1.894 m Patient Age:    88 years            BP:           128/57 mmHg Patient Gender: F                   HR:           61 bpm. Exam Location:  Inpatient Procedure: 2D Echo, Cardiac Doppler, Color Doppler and Intracardiac            Opacification Agent Indications:    R94.31 Abnormal EKG  History:        Patient has prior history of Echocardiogram examinations, most                 recent 02/12/2020. CHF, Abnormal ECG, Arrythmias:Atrial                 Fibrillation and Bradycardia, Signs/Symptoms:Shortness of                 Breath, Dyspnea and Bacteremia; Risk Factors:Hypertension and                 Dyslipidemia. Cancer.  Sonographer:    Roseanna Rainbow RDCS Referring Phys: [id number] [facility]  Sonographer Comments: Technically difficult study due to poor echo windows. Image acquisition challenging due to patient body habitus. IMPRESSIONS  1. Abnormal septal motion. Left ventricular ejection fraction, by estimation, is 55 to 60%. The left ventricle has normal function. The left ventricle has no regional wall motion abnormalities. There is mild left ventricular hypertrophy. Left ventricular diastolic parameters are indeterminate.  2. Right ventricular systolic function is moderately reduced. The right ventricular size is moderately enlarged. There is moderately elevated pulmonary artery systolic pressure.  3. The mitral valve is degenerative. Trivial mitral valve regurgitation. No evidence of mitral stenosis.  4. AV severely thickened and calcified now with mild stenosis compared to TTE done 02/12/20 There contiues  to also be some shadowing artifact in LVOT doubt vegetation and was present on TTE 2021 . The aortic valve is tricuspid. There is severe calcifcation of the aortic valve. There is severe thickening of the aortic valve. Aortic valve regurgitation is mild to  moderate. Mild aortic valve stenosis.  5. Aortic dilatation noted. There is mild dilatation of the ascending aorta, measuring 39 mm.  6. The inferior vena cava is normal in size with greater than 50% respiratory variability, suggesting right atrial pressure of 3 mmHg. FINDINGS  Left Ventricle: Abnormal septal motion. Left ventricular ejection fraction, by estimation, is 55 to 60%. The left ventricle has normal function. The left ventricle has no regional wall motion abnormalities. Definity contrast agent was given IV to delineate the left ventricular endocardial borders. The left ventricular internal cavity size was normal in size. There is mild left ventricular hypertrophy. Left ventricular diastolic parameters are indeterminate. Right Ventricle: The right ventricular size is moderately enlarged. Right vetricular wall thickness was not assessed. Right ventricular systolic function is moderately reduced. There is moderately elevated pulmonary artery systolic pressure. The tricuspid regurgitant velocity is 3.32 m/s, and with an assumed right atrial pressure of 15 mmHg, the estimated right ventricular systolic pressure is 18.2 mmHg. Left Atrium: Left atrial size was normal in size. Right Atrium: Right atrial size was normal in size. Pericardium: There is no evidence of pericardial effusion. Mitral Valve: The mitral valve is degenerative in appearance. There is moderate thickening of the mitral valve leaflet(s). There is moderate calcification of the mitral valve leaflet(s). Trivial mitral valve regurgitation. No evidence of mitral valve stenosis. Tricuspid Valve: The tricuspid valve is normal in structure. Tricuspid valve regurgitation is mild . No evidence of tricuspid stenosis. Aortic Valve: AV severely thickened and calcified now with mild stenosis compared to TTE done 02/12/20 There contiues to also be some shadowing artifact in LVOT doubt vegetation and was present on TTE 2021. The aortic valve is tricuspid. There  is severe calcifcation of the aortic valve. There is severe thickening of the aortic valve. Aortic valve regurgitation is mild to moderate. Aortic regurgitation PHT measures 426 msec. Mild aortic stenosis is present. Aortic valve mean gradient measures 6.0 mmHg. Aortic valve peak gradient measures 12.4 mmHg. Aortic valve area, by VTI measures 1.74 cm. Pulmonic Valve: The pulmonic valve was normal in structure. Pulmonic valve regurgitation is mild. No evidence of pulmonic stenosis. Aorta: Aortic dilatation noted. There is mild dilatation of the ascending aorta, measuring 39 mm. Venous: The inferior vena cava is normal in size with greater than 50% respiratory variability, suggesting right atrial pressure of 3 mmHg. IAS/Shunts: No atrial level shunt detected by color flow Doppler.  LEFT VENTRICLE PLAX 2D LVIDd:         4.60 cm      Diastology LVIDs:         3.00 cm      LV e' medial:    7.01 cm/s LV PW:         1.10 cm      LV E/e' medial:  19.0 LV IVS:        1.20 cm      LV e' lateral:   7.76 cm/s LVOT diam:     1.80 cm      LV E/e' lateral: 17.1 LV SV:         69 LV SV Index:   36 LVOT Area:     2.54 cm  LV Volumes (MOD) LV vol d, MOD A2C:  113.0 ml LV vol d, MOD A4C: 103.6 ml LV vol s, MOD A2C: 40.0 ml LV vol s, MOD A4C: 34.3 ml LV SV MOD A2C:     73.0 ml LV SV MOD A4C:     103.6 ml LV SV MOD BP:      68.8 ml RIGHT VENTRICLE             IVC RV S prime:     17.70 cm/s  IVC diam: 2.00 cm TAPSE (M-mode): 2.8 cm LEFT ATRIUM             Index        RIGHT ATRIUM           Index LA diam:        2.90 cm 1.53 cm/m   RA Area:     16.90 cm LA Vol (A2C):   47.7 ml 25.19 ml/m  RA Volume:   42.50 ml  22.44 ml/m LA Vol (A4C):   70.1 ml 37.02 ml/m LA Biplane Vol: 61.0 ml 32.21 ml/m  AORTIC VALVE                     PULMONIC VALVE AV Area (Vmax):    1.85 cm      PR End Diast Vel: 1.75 msec AV Area (Vmean):   1.66 cm AV Area (VTI):     1.74 cm AV Vmax:           176.00 cm/s AV Vmean:          115.000 cm/s AV VTI:             0.396 m AV Peak Grad:      12.4 mmHg AV Mean Grad:      6.0 mmHg LVOT Vmax:         128.00 cm/s LVOT Vmean:        75.100 cm/s LVOT VTI:          0.271 m LVOT/AV VTI ratio: 0.68 AI PHT:            426 msec  AORTA Ao Root diam: 3.60 cm Ao Asc diam:  3.90 cm MITRAL VALVE                TRICUSPID VALVE MV Area (PHT): 4.80 cm     TR Peak grad:   44.1 mmHg MV Decel Time: 158 msec     TR Vmax:        332.00 cm/s MV E velocity: 133.00 cm/s MV A velocity: 71.00 cm/s   SHUNTS MV E/A ratio:  1.87         Systemic VTI:  0.27 m                             Systemic Diam: 1.80 cm Jenkins Rouge MD Electronically signed by Jenkins Rouge MD Signature Date/Time: 01/03/2022/3:31:04 PM    Final     Cardiac Studies   Pending echo  Patient Profile     Christina Morgan is a 88 y.o. female with with a hx of persistent atrial fibrillation (Apixaban, Flecainide), HFpEF EF 50-60 ,hypertension, hyperlipidemia,  chronic kidney disease, symptomatic bradycardia who is being seen 01/02/2022 for the evaluation of bradycardia at the request of Dr. Alvino Chapel, planned pacemaker  Assessment & Plan    #SSS/Tachybrady:   # PAF:  It seems that she has sick sinus syndrome with episodes of atrial fibrillation with RVR and bradycardia  when in sinus rhythm.  She was previously on atenolol, which was switched to metoprolol due to bradycardia.  The dose was reduced to 25mg .  She presented to the hospital via EMS with heart rates in the 20s.  She was short of breath and weak.  She was given atropine with improvement in her heart rate.  She was in the 30s when evaluated and stable. - echo showes EF 55-60, aortic valve calcified with mild AS and mild-moderate AI, RV is enlarged with reduced systolic function. Small ascending aneurysm 39 mm. RA pressure 3 - remains in sinus rhythm - planned for PPM placement today  # COVID-19:  She was incidentally noted to have COVID-19 on admission.  She denies any respiratory symptoms. Is on 2L O2 satting  in the high 90s. No sig symptoms. PCR + 01/02/22. Cxray showed mild interstitial edema, small pleural effusions  # Acute on chronic diastolic heart failure: She's had gentle diureses with 40mg  IV lasix -Diuresed well, redosed lasix IV 40 1/23 in the afternoon after 20 mg IV. Net negative 2L (Crt 1.6-1.9) 2.42->2.36->2.45 -can wean O2, satting in the high 90s, continue her home torsemide regimen (takes MWF, and extra 10 as needed)  #Right lower ext swelling and erythema: Mrs Henthorn notes that since radiation her right leg has been swollen, daughter endorses that it looks worse. She also has erythema and warmth - pending vancomycin today prior to procedure - scheduled keflex 500 mg BID for 7 days starting 01/05/2022 - RLE duplex tomorrow  #HTN- well controlled, Home hydralazine held, can restart post-op  #HLD- continue atorvastatin 10 mg daily  #Nonunited dens fracture- alignment is stable. She has a neck brace  As long as she tolerates her procedure likely DC Wednesday or Thursday  For questions or updates, please contact [facility] Please consult [email or web address] for contact info under        Signed, Janina Mayo, MD  01/04/2022, 10:13 AM

## 2022-01-04 NOTE — Plan of Care (Signed)
With right leg more swollen and erythematous. Will plan to treat cellulitis as to avoid risk of lead infection. Spoke with Dr. Mardene Speak team and plan is to cancel pacemaker placement today. Will continue to avoid AV nodal blocking agents. Will plan for close outpatient follow-up. Continue to manage inpatient today and re-assess in the AM. Will also plan for LE duplex of the right leg.

## 2022-01-05 ENCOUNTER — Inpatient Hospital Stay (HOSPITAL_COMMUNITY): Payer: Medicare Other

## 2022-01-05 ENCOUNTER — Other Ambulatory Visit (HOSPITAL_COMMUNITY): Payer: Self-pay

## 2022-01-05 DIAGNOSIS — L538 Other specified erythematous conditions: Secondary | ICD-10-CM

## 2022-01-05 DIAGNOSIS — M7989 Other specified soft tissue disorders: Secondary | ICD-10-CM

## 2022-01-05 DIAGNOSIS — I495 Sick sinus syndrome: Secondary | ICD-10-CM | POA: Diagnosis not present

## 2022-01-05 DIAGNOSIS — I5033 Acute on chronic diastolic (congestive) heart failure: Secondary | ICD-10-CM | POA: Diagnosis not present

## 2022-01-05 DIAGNOSIS — U071 COVID-19: Secondary | ICD-10-CM | POA: Diagnosis not present

## 2022-01-05 DIAGNOSIS — I480 Paroxysmal atrial fibrillation: Secondary | ICD-10-CM | POA: Diagnosis not present

## 2022-01-05 LAB — BASIC METABOLIC PANEL
Anion gap: 9 (ref 5–15)
BUN: 41 mg/dL — ABNORMAL HIGH (ref 8–23)
CO2: 27 mmol/L (ref 22–32)
Calcium: 9.1 mg/dL (ref 8.9–10.3)
Chloride: 105 mmol/L (ref 98–111)
Creatinine, Ser: 2.01 mg/dL — ABNORMAL HIGH (ref 0.44–1.00)
GFR, Estimated: 24 mL/min — ABNORMAL LOW (ref >60)
Glucose, Bld: 77 mg/dL (ref 70–99)
Potassium: 4.2 mmol/L (ref 3.5–5.1)
Sodium: 141 mmol/L (ref 135–145)

## 2022-01-05 MED ORDER — APIXABAN 2.5 MG PO TABS
2.5000 mg | ORAL_TABLET | Freq: Two times a day (BID) | ORAL | Status: DC
  Administered 2022-01-05: 11:00:00 2.5 mg via ORAL
  Filled 2022-01-05: qty 1

## 2022-01-05 MED ORDER — AMIODARONE HCL 200 MG PO TABS
200.0000 mg | ORAL_TABLET | Freq: Two times a day (BID) | ORAL | 0 refills | Status: DC
  Filled 2022-01-05 (×2): qty 60, 30d supply, fill #0

## 2022-01-05 MED ORDER — CEPHALEXIN 500 MG PO CAPS
500.0000 mg | ORAL_CAPSULE | Freq: Two times a day (BID) | ORAL | 0 refills | Status: DC
  Filled 2022-01-05 (×2): qty 12, 6d supply, fill #0

## 2022-01-05 NOTE — NC FL2 (Addendum)
[place] LEVEL OF CARE SCREENING TOOL     IDENTIFICATION  Patient Name: Christina Morgan Birthdate: [birth year] Sex: female Admission Date (Current Location): 01/02/2022  [facility] and [place] Number:  Herbalist and Address:  [facility] [place], [place] [address] [place], [place] [address]      Provider Number: [id number]  Attending Physician Name and Address:  Janina Mayo, MD  Relative Name and Phone Number:  Ayako Tapanes [phone]    Current Level of Care: Hospital Recommended Level of Care: [place] ([facility]) Prior Approval Number:    Date Approved/Denied:   PASRR Number:    Discharge Plan: Other (Comment) ([facility])    Current Diagnoses: Patient Active Problem List   Diagnosis Date Noted   AKI (acute kidney injury) ([place]) 01/02/2022   Symptomatic bradycardia 11/29/2021   Bacteremia due to Gram-positive bacteria 02/13/2020   Acute encephalopathy 02/11/2020   Cervical spine fracture ([place]) 02/11/2020   CAP (community acquired pneumonia) 02/11/2020   Chronic diastolic congestive heart failure ([place])    Stage 3 chronic kidney disease ([place])    PAF (paroxysmal atrial fibrillation) ([place])    Closed displaced intertrochanteric fracture of right femur ([place])    Fall 09/23/2019   SOB (shortness of breath) 01/04/2019   Bilateral lower leg cellulitis 03/30/2018   Acute on chronic diastolic CHF (congestive heart failure) ([place]) 03/30/2018   Edema of extremities 08/19/2014   Essential hypertension, benign 05/15/2014   Mixed hyperlipidemia 05/15/2014   Persistent atrial fibrillation ([place]) 05/15/2014   Endometrial ca ([place]) 12/28/2012    Orientation RESPIRATION BLADDER Height & Weight     Self, Time, Situation, Place  Normal Continent, External catheter (External Urinary Catheter) Weight: 167 lb 12.3 oz (76.1 kg) Height:  5\' 6"  (167.6 cm)  BEHAVIORAL SYMPTOMS/MOOD NEUROLOGICAL BOWEL  NUTRITION STATUS      Continent Diet (No added salt)  AMBULATORY STATUS COMMUNICATION OF NEEDS Skin     Verbally Other (Comment) (PI ankle,L,posterior,R,stage 1,foam lift dressing,clean,dry,intact,every 3 days)                       Personal Care Assistance Level of Assistance  Bathing, Feeding, Dressing Bathing Assistance: Limited assistance Feeding assistance: Independent (Able to feed self) Dressing Assistance: Limited assistance     Functional Limitations Info  Sight, Hearing, Speech Sight Info: Impaired   Speech Info: Adequate    SPECIAL CARE FACTORS FREQUENCY  PT (By licensed PT), OT (By licensed OT)     PT Frequency: 3x min weekly OT Frequency: 3x min weekly            Contractures Contractures Info: Not present    Additional Factors Info  Code Status, Allergies Code Status Info: FULL Allergies Info: Penicillins           TAKE these medications     acetaminophen 500 MG tablet Commonly known as: TYLENOL Take 1,000 mg by mouth 3 (three) times daily as needed for moderate pain or mild pain.    albuterol 108 (90 Base) MCG/ACT inhaler Commonly known as: VENTOLIN HFA Inhale 2 puffs into the lungs every 4 (four) hours as needed for wheezing or shortness of breath (please try to use ALbuterol inhaler PRIOR to Nebs). What changed: reasons to take this    amiodarone 200 MG tablet Commonly known as: PACERONE Take 1 tablet (200 mg total) by mouth 2 (two) times daily.    amLODipine 5 MG tablet  Commonly known as: NORVASC Take 1 tablet (5 mg total) by mouth daily.    apixaban 2.5 MG Tabs tablet Commonly known as: ELIQUIS Take 1 tablet (2.5 mg total) by mouth 2 (two) times daily.    ascorbic acid 500 MG tablet Commonly known as: VITAMIN C Take 1 tablet (500 mg total) by mouth daily.    atorvastatin 10 MG tablet Commonly known as: LIPITOR Take 1 tablet (10 mg total) by mouth at bedtime.    cephALEXin 500 MG capsule Commonly known as: KEFLEX Take 1  capsule (500 mg total) by mouth every 12 (twelve) hours.    DAILY VITE PO Take 1 tablet by mouth daily.    guaiFENesin 100 MG/5ML liquid Commonly known as: ROBITUSSIN Take 10 mLs by mouth every 6 (six) hours as needed for cough or to loosen phlegm.    hydrALAZINE 50 MG tablet Commonly known as: APRESOLINE Take 1 tablet (50 mg total) by mouth every 8 (eight) hours.    letrozole 2.5 MG tablet Commonly known as: FEMARA Take 2.5 mg by mouth daily.    loperamide 2 MG tablet Commonly known as: IMODIUM A-D Take 2 mg by mouth every 6 (six) hours as needed for diarrhea or loose stools. Do not exceed 8mg  in 24 hours.    nystatin powder Generic drug: nystatin Apply 1 g topically See admin instructions. Spread topically to area under bilateral breasts & groin twice a day at 8 AM and 8 PM    omeprazole 20 MG capsule Commonly known as: PRILOSEC Take 20 mg by mouth daily.    torsemide 20 MG tablet Commonly known as: DEMADEX Take 10 mg by mouth daily as needed (weight gain or 5lbs in 1 week or swelling). This is in conjunction with the daily Torsemide patient takes.    torsemide 10 MG tablet Commonly known as: DEMADEX Take 10 mg by mouth every Monday, Wednesday, and Friday.    vitamin B-12 250 MCG tablet Commonly known as: CYANOCOBALAMIN Take 250 mcg by mouth every other day.    Vitamin D 125 MCG (5000 UT) Caps Take 125 mcg by mouth daily.   Relevant Imaging Results:  Relevant Lab Results:   Additional Information [phone]  Milas Gain, LCSWA

## 2022-01-05 NOTE — Progress Notes (Signed)
Lower extremity venous RT study completed.  Preliminary results relayed to Harl Bowie, MD via secure chat.  See CV Proc for preliminary results report.   Darlin Coco, RDMS, RVT

## 2022-01-05 NOTE — Progress Notes (Signed)
ANTICOAGULATION CONSULT NOTE - Initial Consult  Pharmacy Consult for Apixaban  Indication: atrial fibrillation  Allergies  Allergen Reactions   Penicillins Itching and Other (See Comments)    Dizziness to the patient of passing out- told by MD to "never take this again" Did it involve swelling of the face/tongue/throat, SOB, or low BP? No Did it involve sudden or severe rash/hives, skin peeling, or any reaction on the inside of your mouth or nose? No Did you need to seek medical attention at a hospital or doctor's office? No When did it last happen? "a long time ago"    If all above answers are "NO", may proceed with cephalosporin use.     Patient Measurements: Height: 5\' 6"  (167.6 cm) Weight: 76.1 kg (167 lb 12.3 oz) IBW/kg (Calculated) : 59.3   Vital Signs: Temp: 97.5 F (36.4 C) (01/25 0432) Temp Source: Oral (01/25 0432) BP: 137/56 (01/25 0800) Pulse Rate: 63 (01/25 0800)  Labs: Recent Labs    01/02/22 1057 01/03/22 0308 01/04/22 0121 01/05/22 0053  HGB 10.6* 9.5*  --   --   HCT 33.1* 30.2*  --   --   PLT 189 173  --   --   CREATININE 2.42* 2.36* 2.45* 2.01*    Estimated Creatinine Clearance: 20.9 mL/min (A) (by C-G formula based on SCr of 2.01 mg/dL (H)).   Medical History: Past Medical History:  Diagnosis Date   Cancer [facility])    endometrial ca   Chronic diastolic CHF (congestive heart failure) (H[facility])    Chronic edema    CKD (chronic kidney disease), stage III (HCC)    Dyslipidemia    Fracture of left humerus    History of cardiovascular stress test    Lexiscan Myoview (06/2014): No ischemia or scar, not gated, low risk   Hx of echocardiogram    Echo (05/02/14): EF 60% to 65%. No regional wall motion abnormalities. Mild AI, mildly dilated aortic root (37 mm), trivial MR, trivial TR   Hyperlipidemia    Hypertension    Lipoma of skin    multiple   Osteopenia    PAF (paroxysmal atrial fibrillation) ([place])    failed DCCV 05/2014 >> Flecainide started >>  DCCV 7/15 sucessful;  f/u ETT neg for pro-arrhythmia >> recurrent AF/AFL >> Flecainide inc to 100 bid with repeat DCCV 9/15   Vitamin D deficiency 09/25/2019      Assessment: 88yo with Afib on apixaban pta  - held on admit for planned PPM - now on hold with new LE cellulitis - treated with keflex.  Doppler negative for DVT H/h stable   Goal of Therapy:  Monitor platelets by anticoagulation protocol: Yes   Plan:  Apixaban 2.5mg  BID  Monitor s/s bleeding   Bonnita Nasuti Pharm.D. CPP, BCPS Clinical Pharmacist [phone] 01/05/2022 10:09 AM ,c

## 2022-01-05 NOTE — Progress Notes (Addendum)
Electrophysiology Rounding Note  Patient Name: Christina Morgan Date of Encounter: 01/05/2022  Primary Cardiologist: Fransico Him, MD Electrophysiologist: Dr. Curt Bears   Subjective   The patient is doing well today.  At this time, the patient denies chest pain, shortness of breath, or any new concerns.  Inpatient Medications    Scheduled Meds:  amiodarone  200 mg Oral BID   atorvastatin  10 mg Oral QHS   cephALEXin  500 mg Oral Q12H   chlorhexidine  60 mL Topical Once   Chlorhexidine Gluconate Cloth  6 each Topical Daily   letrozole  2.5 mg Oral Daily   mouth rinse  15 mL Mouth Rinse BID   nystatin  1 g Topical BID   pantoprazole  40 mg Oral Daily   sodium chloride flush  3 mL Intravenous Q12H   torsemide  10 mg Oral Q M,W,F   Continuous Infusions:  sodium chloride     sodium chloride Stopped (01/02/22 1354)   sodium chloride Stopped (01/04/22 1351)   sodium chloride Stopped (01/04/22 1351)   PRN Meds: sodium chloride, acetaminophen, albuterol, guaiFENesin, ondansetron (ZOFRAN) IV, sodium chloride flush   Vital Signs    Vitals:   01/05/22 0500 01/05/22 0600 01/05/22 0700 01/05/22 0800  BP: (!) 124/50 (!) 130/51 (!) 142/85 (!) 137/56  Pulse: (!) 58 (!) 54 68 63  Resp: 16 (!) 9 18 11   Temp:      TempSrc:      SpO2: 100% 100% 100% 95%  Weight:      Height:        Intake/Output Summary (Last 24 hours) at 01/05/2022 0910 Last data filed at 01/05/2022 0800 Gross per 24 hour  Intake 788.02 ml  Output 850 ml  Net -61.98 ml   Filed Weights   01/03/22 0346 01/04/22 0353 01/05/22 0432  Weight: 79.8 kg 78.5 kg 76.1 kg    Physical Exam    GEN- The patient is well appearing, alert and oriented x 3 today.   Head- normocephalic, atraumatic Eyes-  Sclera clear, conjunctiva pink Ears- hearing intact Oropharynx- clear Neck- supple Lungs- Clear to ausculation bilaterally, normal work of breathing Heart- Regular rate and rhythm, no murmurs, rubs or gallops GI-  soft, NT, ND, + BS Extremities- no clubbing or cyanosis. No edema Skin- no rash or lesion Psych- euthymic mood, full affect Neuro- strength and sensation are intact  Labs    CBC Recent Labs    01/02/22 1057 01/03/22 0308  WBC 7.0 5.2  NEUTROABS 5.7  --   HGB 10.6* 9.5*  HCT 33.1* 30.2*  MCV 99.7 98.1  PLT 189 270   Basic Metabolic Panel Recent Labs    01/03/22 0308 01/04/22 0121 01/05/22 0053  NA 137 141 141  K 4.8 4.2 4.2  CL 107 105 105  CO2 23 26 27   GLUCOSE 84 81 77  BUN 51* 46* 41*  CREATININE 2.36* 2.45* 2.01*  CALCIUM 9.2 9.3 9.1  MG 2.4  --   --   PHOS 4.8*  --   --    Liver Function Tests No results for input(s): AST, ALT, ALKPHOS, BILITOT, PROT, ALBUMIN in the last 72 hours. No results for input(s): LIPASE, AMYLASE in the last 72 hours. Cardiac Enzymes No results for input(s): CKTOTAL, CKMB, CKMBINDEX, TROPONINI in the last 72 hours.   Telemetry    SB/NSR 50-60s (personally reviewed)  Radiology    ECHOCARDIOGRAM COMPLETE  Result Date: 01/03/2022    ECHOCARDIOGRAM REPORT   Patient  Name:   Christina Morgan Date of Exam: 01/03/2022 Medical Rec #:  [id number]           Height:       66.0 in Accession #:    [id number]          Weight:       175.9 lb Date of Birth:  [birth year]            BSA:          1.894 m Patient Age:    44 years            BP:           128/57 mmHg Patient Gender: F                   HR:           61 bpm. Exam Location:  Inpatient Procedure: 2D Echo, Cardiac Doppler, Color Doppler and Intracardiac            Opacification Agent Indications:    R94.31 Abnormal EKG  History:        Patient has prior history of Echocardiogram examinations, most                 recent 02/12/2020. CHF, Abnormal ECG, Arrythmias:Atrial                 Fibrillation and Bradycardia, Signs/Symptoms:Shortness of                 Breath, Dyspnea and Bacteremia; Risk Factors:Hypertension and                 Dyslipidemia. Cancer.  Sonographer:    Roseanna Rainbow RDCS Referring  Phys: [id number] Hilton [facility]  Sonographer Comments: Technically difficult study due to poor echo windows. Image acquisition challenging due to patient body habitus. IMPRESSIONS  1. Abnormal septal motion. Left ventricular ejection fraction, by estimation, is 55 to 60%. The left ventricle has normal function. The left ventricle has no regional wall motion abnormalities. There is mild left ventricular hypertrophy. Left ventricular diastolic parameters are indeterminate.  2. Right ventricular systolic function is moderately reduced. The right ventricular size is moderately enlarged. There is moderately elevated pulmonary artery systolic pressure.  3. The mitral valve is degenerative. Trivial mitral valve regurgitation. No evidence of mitral stenosis.  4. AV severely thickened and calcified now with mild stenosis compared to TTE done 02/12/20 There contiues to also be some shadowing artifact in LVOT doubt vegetation and was present on TTE 2021 . The aortic valve is tricuspid. There is severe calcifcation of the aortic valve. There is severe thickening of the aortic valve. Aortic valve regurgitation is mild to moderate. Mild aortic valve stenosis.  5. Aortic dilatation noted. There is mild dilatation of the ascending aorta, measuring 39 mm.  6. The inferior vena cava is normal in size with greater than 50% respiratory variability, suggesting right atrial pressure of 3 mmHg. FINDINGS  Left Ventricle: Abnormal septal motion. Left ventricular ejection fraction, by estimation, is 55 to 60%. The left ventricle has normal function. The left ventricle has no regional wall motion abnormalities. Definity contrast agent was given IV to delineate the left ventricular endocardial borders. The left ventricular internal cavity size was normal in size. There is mild left ventricular hypertrophy. Left ventricular diastolic parameters are indeterminate. Right Ventricle: The right ventricular size is moderately enlarged. Right vetricular  wall thickness was not assessed. Right ventricular systolic function is moderately reduced. There  is moderately elevated pulmonary artery systolic pressure. The tricuspid regurgitant velocity is 3.32 m/s, and with an assumed right atrial pressure of 15 mmHg, the estimated right ventricular systolic pressure is 32.9 mmHg. Left Atrium: Left atrial size was normal in size. Right Atrium: Right atrial size was normal in size. Pericardium: There is no evidence of pericardial effusion. Mitral Valve: The mitral valve is degenerative in appearance. There is moderate thickening of the mitral valve leaflet(s). There is moderate calcification of the mitral valve leaflet(s). Trivial mitral valve regurgitation. No evidence of mitral valve stenosis. Tricuspid Valve: The tricuspid valve is normal in structure. Tricuspid valve regurgitation is mild . No evidence of tricuspid stenosis. Aortic Valve: AV severely thickened and calcified now with mild stenosis compared to TTE done 02/12/20 There contiues to also be some shadowing artifact in LVOT doubt vegetation and was present on TTE 2021. The aortic valve is tricuspid. There is severe calcifcation of the aortic valve. There is severe thickening of the aortic valve. Aortic valve regurgitation is mild to moderate. Aortic regurgitation PHT measures 426 msec. Mild aortic stenosis is present. Aortic valve mean gradient measures 6.0 mmHg. Aortic valve peak gradient measures 12.4 mmHg. Aortic valve area, by VTI measures 1.74 cm. Pulmonic Valve: The pulmonic valve was normal in structure. Pulmonic valve regurgitation is mild. No evidence of pulmonic stenosis. Aorta: Aortic dilatation noted. There is mild dilatation of the ascending aorta, measuring 39 mm. Venous: The inferior vena cava is normal in size with greater than 50% respiratory variability, suggesting right atrial pressure of 3 mmHg. IAS/Shunts: No atrial level shunt detected by color flow Doppler.  LEFT VENTRICLE PLAX 2D LVIDd:          4.60 cm      Diastology LVIDs:         3.00 cm      LV e' medial:    7.01 cm/s LV PW:         1.10 cm      LV E/e' medial:  19.0 LV IVS:        1.20 cm      LV e' lateral:   7.76 cm/s LVOT diam:     1.80 cm      LV E/e' lateral: 17.1 LV SV:         69 LV SV Index:   36 LVOT Area:     2.54 cm  LV Volumes (MOD) LV vol d, MOD A2C: 113.0 ml LV vol d, MOD A4C: 103.6 ml LV vol s, MOD A2C: 40.0 ml LV vol s, MOD A4C: 34.3 ml LV SV MOD A2C:     73.0 ml LV SV MOD A4C:     103.6 ml LV SV MOD BP:      68.8 ml RIGHT VENTRICLE             IVC RV S prime:     17.70 cm/s  IVC diam: 2.00 cm TAPSE (M-mode): 2.8 cm LEFT ATRIUM             Index        RIGHT ATRIUM           Index LA diam:        2.90 cm 1.53 cm/m   RA Area:     16.90 cm LA Vol (A2C):   47.7 ml 25.19 ml/m  RA Volume:   42.50 ml  22.44 ml/m LA Vol (A4C):   70.1 ml 37.02 ml/m LA Biplane Vol: 61.0 ml 32.21 ml/m  AORTIC VALVE                     PULMONIC VALVE AV Area (Vmax):    1.85 cm      PR End Diast Vel: 1.75 msec AV Area (Vmean):   1.66 cm AV Area (VTI):     1.74 cm AV Vmax:           176.00 cm/s AV Vmean:          115.000 cm/s AV VTI:            0.396 m AV Peak Grad:      12.4 mmHg AV Mean Grad:      6.0 mmHg LVOT Vmax:         128.00 cm/s LVOT Vmean:        75.100 cm/s LVOT VTI:          0.271 m LVOT/AV VTI ratio: 0.68 AI PHT:            426 msec  AORTA Ao Root diam: 3.60 cm Ao Asc diam:  3.90 cm MITRAL VALVE                TRICUSPID VALVE MV Area (PHT): 4.80 cm     TR Peak grad:   44.1 mmHg MV Decel Time: 158 msec     TR Vmax:        332.00 cm/s MV E velocity: 133.00 cm/s MV A velocity: 71.00 cm/s   SHUNTS MV E/A ratio:  1.87         Systemic VTI:  0.27 m                             Systemic Diam: 1.80 cm Jenkins Rouge MD Electronically signed by Jenkins Rouge MD Signature Date/Time: 01/03/2022/3:31:04 PM    Final     Patient Profile     Christina Morgan is a 88 y.o. female with a hx of persistent atrial fibrillation s/p DCCV 12/16/2021 on  flecainide and apixaban, chronic diastolic heart failure and htn who is being seen 01/02/2022 for the evaluation of tachy-brady syndrome at the request of Dr Oval Linsey.  Assessment & Plan    Tachycardia-Bradycardia Syndrome Atrial fibrillation Patient with multiple admissions for atrial fibrillation with RVR requiring DCCV and bradycardia.  May ultimately require PPM unless we can adequately control her on amiodarone Continue amiodarone 200 mg BID x 1 month, then 200 mg daily.  Echo 01/03/22 EF 55-60% No real utility for monitor; if she has further symptoms of heart block or SND, would proceed with pacing.  Resume [place].    3. AKI Cr 2.42 -> 2.36 -> 2.45 -> 2.0 Now back near baseline.     4. COVID-19:  Residual positive.    5. Acute on chronic diastolic heart failure: Volume status improved with Cr back up slightly.   Outpatient follow up made. EP to see as needed while here.   For questions or updates, please contact [place] Please consult [email or web address] for contact info under Cardiology/STEMI.  Signed, Shirley Friar, PA-C  01/05/2022, 9:10 AM   I have seen and examined this patient with Oda Kilts.  Agree with above, note added to reflect my findings.  Feeling well without complaint.  Content with not having pacemaker implanted and amiodarone for atrial fibrillation.  GEN: Well nourished, well developed, in no acute distress  HEENT: normal  Neck: no JVD, carotid bruits, or  masses Cardiac: RRR; no murmurs, rubs, or gallops,no edema  Respiratory:  clear to auscultation bilaterally, normal work of breathing GI: soft, nontender, nondistended, + BS MS: no deformity or atrophy  Skin: warm and dry\ Neuro:  Strength and sensation are intact Psych: euthymic mood, full affect   Tachybradycardia syndrome/atrial fibrillation: Patient wishes to hold off on pacemaker implant.  Due to that, we Tysheka Fanguy give her medications to keep her in sinus rhythm that Shantara Goosby not affect her  heart rate.  We Brieanna Nau plan for amiodarone 200 mg twice daily for 1 month followed by 200 mg daily.  Lealon Vanputten M. Tanija Germani MD 01/06/2022 4:03 PM

## 2022-01-05 NOTE — Plan of Care (Signed)

## 2022-01-05 NOTE — Discharge Summary (Signed)
Discharge Summary    Patient ID: Christina Morgan MRN: [id number]; DOB: [birth year]  Admit date: 01/02/2022 Discharge date: 01/05/2022  PCP:  [facility] Cardiologist:  Fransico Him, MD      Discharge Diagnoses    Principal Problem:   Symptomatic bradycardia Active Problems:   Essential hypertension, benign   Mixed hyperlipidemia   Acute on chronic diastolic CHF (congestive heart failure) (HCC)   PAF (paroxysmal atrial fibrillation) (HCC)   AKI (acute kidney injury) ([place])    Diagnostic Studies/Procedures    Echo 01/03/2022   1. Abnormal septal motion. Left ventricular ejection fraction, by  estimation, is 55 to 60%. The left ventricle has normal function. The left  ventricle has no regional wall motion abnormalities. There is mild left  ventricular hypertrophy. Left  ventricular diastolic parameters are indeterminate.   2. Right ventricular systolic function is moderately reduced. The right  ventricular size is moderately enlarged. There is moderately elevated  pulmonary artery systolic pressure.   3. The mitral valve is degenerative. Trivial mitral valve regurgitation.  No evidence of mitral stenosis.   4. AV severely thickened and calcified now with mild stenosis compared to  TTE done 02/12/20 There contiues to also be some shadowing artifact in LVOT  doubt vegetation and was present on TTE 2021 . The aortic valve is  tricuspid. There is severe  calcifcation of the aortic valve. There is severe thickening of the aortic  valve. Aortic valve regurgitation is mild to moderate. Mild aortic valve  stenosis.   5. Aortic dilatation noted. There is mild dilatation of the ascending  aorta, measuring 39 mm.   6. The inferior vena cava is normal in size with greater than 50%  respiratory variability, suggesting right atrial pressure of 3 mmHg.  _____________   History of Present Illness     Christina Morgan is a 88 y.o. female  with a hx of persistent atrial fibrillation (Apixaban, Flecainide), HFpEF EF 50-60 ,hypertension, hyperlipidemia,  chronic kidney disease, symptomatic bradycardia who is being seen 01/02/2022 for the evaluation of bradycardia at the request of Dr. Alvino Chapel, planned pacemaker but delayed due to cellulitis.   Christina Morgan was last seen in clinic by Laurann Montana, NP on 12/24/21 for post hospital follow up.  She was admitted in Dec 2022 with symptomatic bradycardia (HRs in the 30s) and clonidine and atenolol were both DC'd.  She developed COVID-19 after DC.  She then presented to the ED with AFib with RVR on 12/15/21 and underwent DCCV.  She was started back on beta blocker Rx with Metoprolol succinate 25 mg daily (she was previously on 100 mg daily).  When she saw Urban Gibson, she was in NSR with a HR of 61.  She presented the emergency room on 1/22 via EMS from [facility] with shortness of breath and generalized weakness.  She was found to have a heart rate in the 20s.  She was given atropine with some improvement in her heart rate.  Chest x-ray did demonstrate some edema.  Her creatinine was worse (2.42; up from 1.67 on 1/13).   She had recently been feeling weak and near syncopal.  She denied syncope.  She felt more short of breath and her legs were more swollen.  She felt that her abdomen was distended.  She denied had chest pain. She sleeps in a recliner and has done so for years.   Hospital Course     Consultants: Electrophysiology    #SSS/Tachybrady:   #  PAF:  It seems that she has sick sinus syndrome with episodes of atrial fibrillation with RVR and bradycardia when in sinus rhythm.  She was previously on atenolol, which was switched to metoprolol due to bradycardia.  The dose was reduced to 25mg .  She presented to the hospital via EMS with heart rates in the 20s.  She was short of breath and weak.  She was given atropine with improvement in her heart rate.  She was in the 30s when evaluated and  stable. - echo this admission showed EF 55-60, aortic valve calcified with mild AS and mild-moderate AI, RV is enlarged with reduced systolic function. Small ascending aneurysm 39 mm. RA pressure 3 - remains in sinus rhythm - Was set to undergo PPM placement on 1/24, however the patient developed worsening right leg swelling and erythema concerning for cellulitis. PPM placement was canceled due to risk of infection. Will be followed outpatient by EP  - Will be sent home on amiodarone 200 mg BID for afib rate/rhythm control. Off flecainide/metoprolol  - Will restart home eliquis, EP can discontinue at follow up as needed for PPM implant    # COVID-19:  She was incidentally noted to have COVID-19 on admission.  She denies any respiratory symptoms. Is on 2L O2 satting in the high 90s. No sig symptoms. PCR + 01/02/22. Cxray showed mild interstitial edema, small pleural effusions  # Acute on chronic diastolic heart failure: She had gentle diureses with 40mg  IV lasix -Diuresed well, redosed lasix IV 40 1/23 in the afternoon after 20 mg IV. Net negative 2L (Crt 1.6-1.9) 2.42->2.36->2.45 -can wean O2, satting in the high 90s, continue her home torsemide regimen (takes MWF, and extra 10 as needed)   #Right lower ext swelling and erythema: Christina Morgan notes that since radiation her right leg has been swollen, daughter endorses that it looks worse. She also has erythema and warmth - scheduled keflex 500 mg BID for 7 days starting 01/05/2022 - RLE duplex today was negative for DVT    #HTN- well controlled, Home hydralazine and amlodipine    #HLD- continue atorvastatin 10 mg daily   #Nonunited dens fracture- alignment is stable. She has a neck brace   Patient was seen and evaluated by Dr. Harl Bowie and deemed stable for discharge. Follow up appointments have been arranged   Did the patient have an acute coronary syndrome (MI, NSTEMI, STEMI, etc) this admission?:  No                               Did the  patient have a percutaneous coronary intervention (stent / angioplasty)?:  No.        The patient has been scheduled for a TOC follow up appointment in 3 weeks with EP.   Discharge Vitals Blood pressure (!) 145/60, pulse (!) 58, temperature (!) 97.5 F (36.4 C), temperature source Oral, resp. rate 16, height 5\' 6"  (1.676 m), weight 76.1 kg, SpO2 97 %.  Filed Weights   01/03/22 0346 01/04/22 0353 01/05/22 0432  Weight: 79.8 kg 78.5 kg 76.1 kg    Labs & Radiologic Studies    CBC Recent Labs    01/03/22 0308  WBC 5.2  HGB 9.5*  HCT 30.2*  MCV 98.1  PLT 347   Basic Metabolic Panel Recent Labs    01/03/22 0308 01/04/22 0121 01/05/22 0053  NA 137 141 141  K 4.8 4.2 4.2  CL 107 105  105  CO2 23 26 27   GLUCOSE 84 81 77  BUN 51* 46* 41*  CREATININE 2.36* 2.45* 2.01*  CALCIUM 9.2 9.3 9.1  MG 2.4  --   --   PHOS 4.8*  --   --    Liver Function Tests No results for input(s): AST, ALT, ALKPHOS, BILITOT, PROT, ALBUMIN in the last 72 hours. No results for input(s): LIPASE, AMYLASE in the last 72 hours. High Sensitivity Troponin:   Recent Labs  Lab 12/16/21 1245 12/16/21 1424  TROPONINIHS 11 12    BNP Invalid input(s): POCBNP D-Dimer No results for input(s): DDIMER in the last 72 hours. Hemoglobin A1C No results for input(s): HGBA1C in the last 72 hours. Fasting Lipid Panel No results for input(s): CHOL, HDL, LDLCALC, TRIG, CHOLHDL, LDLDIRECT in the last 72 hours. Thyroid Function Tests Recent Labs    01/03/22 0308  TSH 5.147*   _____________  DG Chest 2 View  Result Date: 12/15/2021 CLINICAL DATA:  Cough. EXAM: CHEST - 2 VIEW COMPARISON:  Chest radiograph dated 11/29/2021. FINDINGS: No focal consolidation, pleural effusion, pneumothorax. Mild diffuse chronic interstitial coarsening. Faint nodularity over the left lower lung field may be chronic. Atypical pneumonia is not excluded. Stable cardiomediastinal silhouette. Atherosclerotic calcification of the aorta.  Osteopenia with degenerative changes of the spine and lower thoracic vertebroplasty. No acute osseous pathology. IMPRESSION: 1. No acute cardiopulmonary process. 2. Faint nodularity over the left lower lung field may be chronic. Atypical pneumonia is not excluded. Clinical correlation is recommended. Electronically Signed   By: Anner Crete M.D.   On: 12/15/2021 22:56   DG Chest Portable 1 View  Result Date: 01/02/2022 CLINICAL DATA:  Weakness EXAM: PORTABLE CHEST 1 VIEW COMPARISON:  Chest x-ray 12/15/2021 FINDINGS: Heart is enlarged. Mediastinum appears stable. Calcified plaques in the aortic arch. Mild pulmonary vascular/interstitial prominence similar to previous study. Small bilateral pleural effusions. No pneumothorax. IMPRESSION: Cardiomegaly with mild pulmonary vascular congestion and small bilateral pleural effusions. Electronically Signed   By: Ofilia Neas M.D.   On: 01/02/2022 11:37   ECHOCARDIOGRAM COMPLETE  Result Date: 01/03/2022    ECHOCARDIOGRAM REPORT   Patient Name:   Christina Morgan Date of Exam: 01/03/2022 Medical Rec #:  [id number]           Height:       66.0 in Accession #:    [id number]          Weight:       175.9 lb Date of Birth:  [birth year]            BSA:          1.894 m Patient Age:    15 years            BP:           128/57 mmHg Patient Gender: F                   HR:           61 bpm. Exam Location:  Inpatient Procedure: 2D Echo, Cardiac Doppler, Color Doppler and Intracardiac            Opacification Agent Indications:    R94.31 Abnormal EKG  History:        Patient has prior history of Echocardiogram examinations, most                 recent 02/12/2020. CHF, Abnormal ECG, Arrythmias:Atrial  Fibrillation and Bradycardia, Signs/Symptoms:Shortness of                 Breath, Dyspnea and Bacteremia; Risk Factors:Hypertension and                 Dyslipidemia. Cancer.  Sonographer:    Roseanna Rainbow RDCS Referring Phys: [id number] [facility]  Sonographer  Comments: Technically difficult study due to poor echo windows. Image acquisition challenging due to patient body habitus. IMPRESSIONS  1. Abnormal septal motion. Left ventricular ejection fraction, by estimation, is 55 to 60%. The left ventricle has normal function. The left ventricle has no regional wall motion abnormalities. There is mild left ventricular hypertrophy. Left ventricular diastolic parameters are indeterminate.  2. Right ventricular systolic function is moderately reduced. The right ventricular size is moderately enlarged. There is moderately elevated pulmonary artery systolic pressure.  3. The mitral valve is degenerative. Trivial mitral valve regurgitation. No evidence of mitral stenosis.  4. AV severely thickened and calcified now with mild stenosis compared to TTE done 02/12/20 There contiues to also be some shadowing artifact in LVOT doubt vegetation and was present on TTE 2021 . The aortic valve is tricuspid. There is severe calcifcation of the aortic valve. There is severe thickening of the aortic valve. Aortic valve regurgitation is mild to moderate. Mild aortic valve stenosis.  5. Aortic dilatation noted. There is mild dilatation of the ascending aorta, measuring 39 mm.  6. The inferior vena cava is normal in size with greater than 50% respiratory variability, suggesting right atrial pressure of 3 mmHg. FINDINGS  Left Ventricle: Abnormal septal motion. Left ventricular ejection fraction, by estimation, is 55 to 60%. The left ventricle has normal function. The left ventricle has no regional wall motion abnormalities. Definity contrast agent was given IV to delineate the left ventricular endocardial borders. The left ventricular internal cavity size was normal in size. There is mild left ventricular hypertrophy. Left ventricular diastolic parameters are indeterminate. Right Ventricle: The right ventricular size is moderately enlarged. Right vetricular wall thickness was not assessed. Right  ventricular systolic function is moderately reduced. There is moderately elevated pulmonary artery systolic pressure. The tricuspid regurgitant velocity is 3.32 m/s, and with an assumed right atrial pressure of 15 mmHg, the estimated right ventricular systolic pressure is 09.3 mmHg. Left Atrium: Left atrial size was normal in size. Right Atrium: Right atrial size was normal in size. Pericardium: There is no evidence of pericardial effusion. Mitral Valve: The mitral valve is degenerative in appearance. There is moderate thickening of the mitral valve leaflet(s). There is moderate calcification of the mitral valve leaflet(s). Trivial mitral valve regurgitation. No evidence of mitral valve stenosis. Tricuspid Valve: The tricuspid valve is normal in structure. Tricuspid valve regurgitation is mild . No evidence of tricuspid stenosis. Aortic Valve: AV severely thickened and calcified now with mild stenosis compared to TTE done 02/12/20 There contiues to also be some shadowing artifact in LVOT doubt vegetation and was present on TTE 2021. The aortic valve is tricuspid. There is severe calcifcation of the aortic valve. There is severe thickening of the aortic valve. Aortic valve regurgitation is mild to moderate. Aortic regurgitation PHT measures 426 msec. Mild aortic stenosis is present. Aortic valve mean gradient measures 6.0 mmHg. Aortic valve peak gradient measures 12.4 mmHg. Aortic valve area, by VTI measures 1.74 cm. Pulmonic Valve: The pulmonic valve was normal in structure. Pulmonic valve regurgitation is mild. No evidence of pulmonic stenosis. Aorta: Aortic dilatation noted. There is mild dilatation of  the ascending aorta, measuring 39 mm. Venous: The inferior vena cava is normal in size with greater than 50% respiratory variability, suggesting right atrial pressure of 3 mmHg. IAS/Shunts: No atrial level shunt detected by color flow Doppler.  LEFT VENTRICLE PLAX 2D LVIDd:         4.60 cm      Diastology LVIDs:          3.00 cm      LV e' medial:    7.01 cm/s LV PW:         1.10 cm      LV E/e' medial:  19.0 LV IVS:        1.20 cm      LV e' lateral:   7.76 cm/s LVOT diam:     1.80 cm      LV E/e' lateral: 17.1 LV SV:         69 LV SV Index:   36 LVOT Area:     2.54 cm  LV Volumes (MOD) LV vol d, MOD A2C: 113.0 ml LV vol d, MOD A4C: 103.6 ml LV vol s, MOD A2C: 40.0 ml LV vol s, MOD A4C: 34.3 ml LV SV MOD A2C:     73.0 ml LV SV MOD A4C:     103.6 ml LV SV MOD BP:      68.8 ml RIGHT VENTRICLE             IVC RV S prime:     17.70 cm/s  IVC diam: 2.00 cm TAPSE (M-mode): 2.8 cm LEFT ATRIUM             Index        RIGHT ATRIUM           Index LA diam:        2.90 cm 1.53 cm/m   RA Area:     16.90 cm LA Vol (A2C):   47.7 ml 25.19 ml/m  RA Volume:   42.50 ml  22.44 ml/m LA Vol (A4C):   70.1 ml 37.02 ml/m LA Biplane Vol: 61.0 ml 32.21 ml/m  AORTIC VALVE                     PULMONIC VALVE AV Area (Vmax):    1.85 cm      PR End Diast Vel: 1.75 msec AV Area (Vmean):   1.66 cm AV Area (VTI):     1.74 cm AV Vmax:           176.00 cm/s AV Vmean:          115.000 cm/s AV VTI:            0.396 m AV Peak Grad:      12.4 mmHg AV Mean Grad:      6.0 mmHg LVOT Vmax:         128.00 cm/s LVOT Vmean:        75.100 cm/s LVOT VTI:          0.271 m LVOT/AV VTI ratio: 0.68 AI PHT:            426 msec  AORTA Ao Root diam: 3.60 cm Ao Asc diam:  3.90 cm MITRAL VALVE                TRICUSPID VALVE MV Area (PHT): 4.80 cm     TR Peak grad:   44.1 mmHg MV Decel Time: 158 msec     TR Vmax:  332.00 cm/s MV E velocity: 133.00 cm/s MV A velocity: 71.00 cm/s   SHUNTS MV E/A ratio:  1.87         Systemic VTI:  0.27 m                             Systemic Diam: 1.80 cm Jenkins Rouge MD Electronically signed by Jenkins Rouge MD Signature Date/Time: 01/03/2022/3:31:04 PM    Final    VAS [place] LOWER EXTREMITY VENOUS (DVT)  Result Date: 01/05/2022  Lower Venous DVT Study Patient Name:  Christina Morgan  Date of Exam:   01/05/2022 Medical Rec #: [id number]             Accession #:    [id number] Date of Birth: November 20, 1933             Patient Gender: F Patient Age:   88 years Exam Location:  [facility] Procedure:      VAS [place] LOWER EXTREMITY VENOUS (DVT) Referring Phys: [facility] --------------------------------------------------------------------------------  Indications: Swelling, erythema of right leg. Other Indications: Recent Covid-19, pre-op Pacemaker. Limitations: Body habitus and poor ultrasound/tissue interface. Comparison       09/29/2019 Bilateral lower extremity venous was negative for Study:           DVT. Performing Technologist: Darlin Coco RDMS, RVT  Examination Guidelines: A complete evaluation includes B-mode imaging, spectral Doppler, color Doppler, and power Doppler as needed of all accessible portions of each vessel. Bilateral testing is considered an integral part of a complete examination. Limited examinations for reoccurring indications may be performed as noted. The reflux portion of the exam is performed with the patient in reverse Trendelenburg.  +---------+---------------+---------+-----------+----------+-------------------+  RIGHT     Compressibility Phasicity Spontaneity Properties Thrombus Aging       +---------+---------------+---------+-----------+----------+-------------------+  CFV       Full            Yes       Yes                                         +---------+---------------+---------+-----------+----------+-------------------+  SFJ       Full                                                                  +---------+---------------+---------+-----------+----------+-------------------+  FV Prox   Full                                                                  +---------+---------------+---------+-----------+----------+-------------------+  FV Mid    Full                                                                  +---------+---------------+---------+-----------+----------+-------------------+  FV Distal Full                                                                   +---------+---------------+---------+-----------+----------+-------------------+  PFV       Full                                                                  +---------+---------------+---------+-----------+----------+-------------------+  POP       Full            Yes       Yes                                         +---------+---------------+---------+-----------+----------+-------------------+  PTV       Full                                             Some segments not                                                                well visualized      +---------+---------------+---------+-----------+----------+-------------------+  PERO      Full                                             Some segments not                                                                well visualized      +---------+---------------+---------+-----------+----------+-------------------+   +----+---------------+---------+-----------+----------+--------------+  LEFT Compressibility Phasicity Spontaneity Properties Thrombus Aging  +----+---------------+---------+-----------+----------+--------------+  CFV  Full            Yes       Yes                                    +----+---------------+---------+-----------+----------+--------------+    Summary: RIGHT: - There is no evidence of deep vein thrombosis in the lower extremity. However, portions of this examination were limited- see technologist comments above.  - No cystic structure found in the popliteal fossa.  LEFT: - No evidence of common femoral vein obstruction.  *See table(s) above for measurements and observations.    Preliminary  Disposition   Pt is being discharged home today in good condition.  Follow-up Plans & Appointments     Follow-up Information     Shirley Friar, PA-C Follow up on 01/26/2022.   Specialty: Physician Assistant Why: 9:20 AM Contact information: [place] [address] [phone]         Sueanne Margarita, MD Follow up on 02/18/2022.   Specialty: Cardiology Why: 1PM Contact information: [address] [place] [address] [phone]                  Discharge Medications   Allergies as of 01/05/2022       Reactions   Penicillins Itching, Other (See Comments)   Dizziness to the patient of passing out- told by MD to "never take this again" Did it involve swelling of the face/tongue/throat, SOB, or low BP? No Did it involve sudden or severe rash/hives, skin peeling, or any reaction on the inside of your mouth or nose? No Did you need to seek medical attention at a hospital or doctor's office? No When did it last happen? "a long time ago"    If all above answers are "NO", may proceed with cephalosporin use.        Medication List     TAKE these medications    acetaminophen 500 MG tablet Commonly known as: TYLENOL Take 1,000 mg by mouth 3 (three) times daily as needed for moderate pain or mild pain.   albuterol 108 (90 Base) MCG/ACT inhaler Commonly known as: VENTOLIN HFA Inhale 2 puffs into the lungs every 4 (four) hours as needed for wheezing or shortness of breath (please try to use ALbuterol inhaler PRIOR to Nebs). What changed: reasons to take this   amiodarone 200 MG tablet Commonly known as: PACERONE Take 1 tablet (200 mg total) by mouth 2 (two) times daily.   amLODipine 5 MG tablet Commonly known as: NORVASC Take 1 tablet (5 mg total) by mouth daily.   apixaban 2.5 MG Tabs tablet Commonly known as: ELIQUIS Take 1 tablet (2.5 mg total) by mouth 2 (two) times daily.   ascorbic acid 500 MG tablet Commonly known as: VITAMIN C Take 1 tablet (500 mg total) by mouth daily.   atorvastatin 10 MG tablet Commonly known as: LIPITOR Take 1 tablet (10 mg total) by mouth at bedtime.   cephALEXin 500 MG capsule Commonly known as: KEFLEX Take 1 capsule (500 mg total) by  mouth every 12 (twelve) hours.   DAILY VITE PO Take 1 tablet by mouth daily.   guaiFENesin 100 MG/5ML liquid Commonly known as: ROBITUSSIN Take 10 mLs by mouth every 6 (six) hours as needed for cough or to loosen phlegm.   hydrALAZINE 50 MG tablet Commonly known as: APRESOLINE Take 1 tablet (50 mg total) by mouth every 8 (eight) hours.   letrozole 2.5 MG tablet Commonly known as: FEMARA Take 2.5 mg by mouth daily.   loperamide 2 MG tablet Commonly known as: IMODIUM A-D Take 2 mg by mouth every 6 (six) hours as needed for diarrhea or loose stools. Do not exceed 8mg  in 24 hours.   nystatin powder Generic drug: nystatin Apply 1 g topically See admin instructions. Spread topically to area under bilateral breasts & groin twice a day at 8 AM and 8 PM   omeprazole 20 MG capsule Commonly known as: PRILOSEC Take 20 mg by mouth daily.   torsemide 20 MG tablet Commonly known  as: DEMADEX Take 10 mg by mouth daily as needed (weight gain or 5lbs in 1 week or swelling). This is in conjunction with the daily Torsemide patient takes.   torsemide 10 MG tablet Commonly known as: DEMADEX Take 10 mg by mouth every Monday, Wednesday, and Friday.   vitamin B-12 250 MCG tablet Commonly known as: CYANOCOBALAMIN Take 250 mcg by mouth every other day.   Vitamin D 125 MCG (5000 UT) Caps Take 125 mcg by mouth daily.           Outstanding Labs/Studies     Duration of Discharge Encounter   Greater than 30 minutes including physician time.  Signed, Margie Billet, PA-C 01/05/2022, 11:18 AM

## 2022-01-05 NOTE — TOC Transition Note (Signed)
Transition of Care [facility]) - CM/SW Discharge Note   Patient Details  Name: [name] MRN: [id number] Date of Birth: 1933-07-27  Transition of Care [facility]) CM/SW Contact:  Milas Gain, Beckham Phone Number: 01/05/2022, 2:44 PM   Clinical Narrative:     CSW spoke with patient and patients daughter Jeyda. Plan is for patient to return to [facility].  Patient will DC to: [facility]  Anticipated DC date: 01/05/2022  Family notified: Benjamine Mola  Transport by: Daughter Benjamine Mola  ?  Per MD patient ready for DC to [facility] . RN, patient, patient's family, and facility Spoke with [employer] and [facility], notified of DC. Discharge Summary and FL2 sent to facility. RN given number for report tele# [phone]. Patients daughter Duru to transport patient back to [facility].  CSW signing off.   Final next level of care: Assisted Living [facility]) Barriers to Discharge: No Barriers Identified   Patient Goals and CMS Choice Patient states their goals for this hospitalization and ongoing recovery are:: ALF CMS Medicare.gov Compare Post Acute Care list provided to:: Patient Choice offered to / list presented to : Patient  Discharge Placement              Patient chooses bed at: [facility] ([place]) Patient to be transferred to facility by: daughter Quana Name of family member notified: Benjamine Mola Patient and family notified of of transfer: 01/05/22  Discharge Plan and Services                                     Social Determinants of Health (SDOH) Interventions     Readmission Risk Interventions No flowsheet data found.

## 2022-01-12 ENCOUNTER — Telehealth: Payer: Self-pay | Admitting: Cardiology

## 2022-01-12 NOTE — Telephone Encounter (Signed)
Spoke with the patient's daughter who reports that the patient is no longer seeing nephrology. She reports that the nephrologist had put the patient on multiple medications and when she was hospitalized the cardiologist took her off of all of the medications that nephrology had prescribed. She states that it was getting too much with too many doctors managing the patient's medications. Advised the daughter on the importance of the patient following with nephrology. She verbalized understanding.

## 2022-01-12 NOTE — Telephone Encounter (Signed)
Pt c/o swelling: STAT is pt has developed SOB within 24 hours  If swelling, where is the swelling located? legs  How much weight have you gained and in what time span? 4 lbs in 5 days  Have you gained 3 pounds in a day or 5 pounds in a week? Yes 3 lbs in a day  Do you have a log of your daily weights (if so, list)? 1/27 170.3 lbs, 1/29 174.2 lbs, 1/30 170.8 lbs, today 174 lbs  Are you currently taking a fluid pill? Yes, torsemide  Are you currently SOB? no  Have you traveled recently? no   Patient's daughter states the patient was just discharged from the hospital Wednesday of last week. She says they were told to call if she gains 3-4 pounds in a couple days or 2 pounds over night. She says the patient is in an assisted living facility and any changes in her medications need to faxed there as an order. Fax: [phone]

## 2022-01-12 NOTE — Telephone Encounter (Signed)
Left message for patient's daughter to call back. 

## 2022-01-12 NOTE — Telephone Encounter (Signed)
Spoke with the patient's daughter who reports that the patient has swelling in her legs and has gained 4 lbs in a couple of days. She is taking torsemide 10 mg every MWF. She has not been taking her extra torsemide 10 mg as needed for swelling. Advised to have the patient take 10 daily for a few days and let us know if she does not have any improvement. She is aware the patient needs to elevate her legs and avoid sodium. She request that I fax orders for PRN torsemide to patient's assisted living.

## 2022-01-16 ENCOUNTER — Other Ambulatory Visit: Payer: Self-pay | Admitting: Physician Assistant

## 2022-01-19 NOTE — Progress Notes (Signed)
PCP:  [facility] Primary Cardiologist: Fransico Him, MD Electrophysiologist: Dr. Margaretha Sheffield Christina Morgan is a 88 y.o. female seen today for Dr. Curt Bears for post hospital follow up.    Admitted 1/22 - 1/25 with AF RVR, having converted to sinus bradycardia as low as in the 20s on flecainide and BB. Noted to have AKI and cellulitis as well.  She improved significantly off BB, in shared decision making chose to attempt to control her AF without AV blockers using amiodarone alone, with pt preference to avoid pacemaker if possible.   Echo that admission with LVEF 55-60%. Treated with IV lasix for A/C diastolic CHF as well. Given Keflex for her RLE cellulitis.   Since discharge from hospital the patient reports doing well. She has mild SOB with changing clothes or bathing. Has not been very mobile for >12 months per daughter. She has peripheral edema up into her thighs. Facility has been wary of adjusting torsemide due to kidney function. Wearing [place] boots and slippers, because she can't get on shoes or socks. Denies SOB at rest. No syncope or lightheadedness.  Past Medical History:  Diagnosis Date   Cancer [facility])    endometrial ca   Chronic diastolic CHF (congestive heart failure) (HCC)    Chronic edema    CKD (chronic kidney disease), stage III (HCC)    Dyslipidemia    Fracture of left humerus    History of cardiovascular stress test    Lexiscan Myoview (06/2014): No ischemia or scar, not gated, low risk   Hx of echocardiogram    Echo (05/02/14): EF 60% to 65%. No regional wall motion abnormalities. Mild AI, mildly dilated aortic root (37 mm), trivial MR, trivial TR   Hyperlipidemia    Hypertension    Lipoma of skin    multiple   Osteopenia    PAF (paroxysmal atrial fibrillation) (HCC)    failed DCCV 05/2014 >> Flecainide started >> DCCV 7/15 sucessful;  f/u ETT neg for pro-arrhythmia >> recurrent AF/AFL >> Flecainide inc to 100 bid with repeat DCCV 9/15   Vitamin D  deficiency 09/25/2019   Past Surgical History:  Procedure Laterality Date   CARDIOVERSION N/A 06/05/2014   Procedure: CARDIOVERSION;  Surgeon: Sueanne Margarita, MD;  Location: [place];  Service: Cardiovascular;  Laterality: N/A;   CARDIOVERSION N/A 07/11/2014   Procedure: CARDIOVERSION;  Surgeon: Sueanne Margarita, MD;  Location: [place];  Service: Cardiovascular;  Laterality: N/A;   CARDIOVERSION N/A 09/04/2014   Procedure: CARDIOVERSION;  Surgeon: Sueanne Margarita, MD;  Location: [facility];  Service: Cardiovascular;  Laterality: N/A;   COLONOSCOPY  2008   COLONOSCOPY N/A 05/20/2015   Procedure: COLONOSCOPY;  Surgeon: Ronald Lobo, MD;  Location: [facility];  Service: Endoscopy;  Laterality: N/A;   FEMUR IM NAIL Right 09/24/2019   Procedure: INTRAMEDULLARY (IM) NAIL FEMORAL;  Surgeon: Altamese [place], MD;  Location: [place];  Service: Orthopedics;  Laterality: Right;   IR KYPHO EA ADDL LEVEL THORACIC OR LUMBAR  04/12/2019   IR KYPHO THORACIC WITH BONE BIOPSY  04/12/2019   ROBOTIC ASSISTED SUPRACERVICAL HYSTERECTOMY WITH BILATERAL SALPINGO OOPHERECTOMY  11/06/2012   and bilateral pelvic lymph node dissection    Current Outpatient Medications  Medication Sig Dispense Refill   acetaminophen (TYLENOL) 500 MG tablet Take 1,000 mg by mouth 3 (three) times daily as needed for moderate pain or mild pain.     albuterol (VENTOLIN HFA) 108 (90 Base) MCG/ACT inhaler Inhale 2 puffs into the lungs every  4 (four) hours as needed for wheezing or shortness of breath (please try to use ALbuterol inhaler PRIOR to [place]). (Patient taking differently: Inhale 2 puffs into the lungs every 4 (four) hours as needed for wheezing or shortness of breath.) 90 g 0   amiodarone (PACERONE) 200 MG tablet Take 1 tablet (200 mg total) by mouth 2 (two) times daily. 60 tablet 0   amLODipine (NORVASC) 5 MG tablet Take 1 tablet (5 mg total) by mouth daily. 30 tablet 6   apixaban (ELIQUIS) 2.5 MG TABS tablet Take 1 tablet (2.5 mg  total) by mouth 2 (two) times daily. 180 tablet 3   atorvastatin (LIPITOR) 10 MG tablet Take 1 tablet (10 mg total) by mouth at bedtime. 90 tablet 3   Cholecalciferol (VITAMIN D) 125 MCG (5000 UT) CAPS Take 125 mcg by mouth daily.     DOCUSATE SODIUM PO Take 1 capsule by mouth daily.     guaiFENesin (ROBITUSSIN) 100 MG/5ML liquid Take 10 mLs by mouth every 6 (six) hours as needed for cough or to loosen phlegm.     hydrALAZINE (APRESOLINE) 50 MG tablet Take 1 tablet (50 mg total) by mouth every 8 (eight) hours. 270 tablet 3   letrozole (FEMARA) 2.5 MG tablet Take 2.5 mg by mouth daily.     levofloxacin (LEVAQUIN) 500 MG tablet Take 500 mg by mouth daily.     loperamide (IMODIUM A-D) 2 MG tablet Take 2 mg by mouth every 6 (six) hours as needed for diarrhea or loose stools. Do not exceed 8mg  in 24 hours.     Multiple Vitamin (DAILY VITE PO) Take 1 tablet by mouth daily.     NYSTATIN powder Apply 1 g topically See admin instructions. Spread topically to area under bilateral breasts & groin twice a day at 8 AM and 8 PM     omeprazole (PRILOSEC) 20 MG capsule Take 20 mg by mouth daily.     torsemide (DEMADEX) 10 MG tablet Take 10 mg by mouth every Monday, Wednesday, and Friday.     torsemide (DEMADEX) 20 MG tablet Take 10 mg by mouth daily as needed (weight gain or 5lbs in 1 week or swelling). This is in conjunction with the daily Torsemide patient takes.     vitamin B-12 (CYANOCOBALAMIN) 250 MCG tablet Take 250 mcg by mouth every other day.     vitamin C (VITAMIN C) 500 MG tablet Take 1 tablet (500 mg total) by mouth daily.     cephALEXin (KEFLEX) 500 MG capsule Take 1 capsule (500 mg total) by mouth every 12 (twelve) hours. (Patient not taking: Reported on 01/26/2022) 12 capsule 0   No current facility-administered medications for this visit.    Allergies  Allergen Reactions   Penicillins Itching and Other (See Comments)    Dizziness to the patient of passing out- told by MD to "never take this  again" Did it involve swelling of the face/tongue/throat, SOB, or low BP? No Did it involve sudden or severe rash/hives, skin peeling, or any reaction on the inside of your mouth or nose? No Did you need to seek medical attention at a hospital or doctor's office? No When did it last happen? "a long time ago"    If all above answers are "NO", may proceed with cephalosporin use.     Social History   Socioeconomic History   Marital status: Widowed    Spouse name: Not on file   Number of children: Not on file   Years  of education: Not on file   Highest education level: Not on file  Occupational History   Not on file  Tobacco Use   Smoking status: Never    Passive exposure: Past   Smokeless tobacco: Never  Vaping Use   Vaping Use: Never used  Substance and Sexual Activity   Alcohol use: No   Drug use: No   Sexual activity: Not on file  Other Topics Concern   Not on file  Social History Narrative   ** Merged History Encounter **       Social Determinants of Health   Financial Resource Strain: Not on file  Food Insecurity: Not on file  Transportation Needs: Not on file  Physical Activity: Not on file  Stress: Not on file  Social Connections: Not on file  Intimate Partner Violence: Not on file     Review of Systems: All other systems reviewed and are otherwise negative except as noted above.  Physical Exam: Vitals:   01/26/22 0902  BP: (!) 136/50  Pulse: 74  SpO2: 94%  Weight: 176 lb 3.2 oz (79.9 kg)  Height: 5\' 6"  (1.676 m)    GEN- The patient is elderly appearing, alert and oriented x 3 today.   HEENT: normocephalic, atraumatic; sclera clear, conjunctiva pink; hearing intact; oropharynx clear; neck supple, no JVP Lymph- no cervical lymphadenopathy Lungs- Clear to ausculation bilaterally, normal work of breathing.  No wheezes, rales, rhonchi Heart- Regular rate and rhythm, no murmurs, rubs or gallops, PMI not laterally displaced GI- soft, non-tender,  non-distended, bowel sounds present, no hepatosplenomegaly Extremities- no clubbing or cyanosis; DP/PT/radial pulses 2+ bilaterally. MS- no significant deformity or atrophy Skin- warm and dry, no rash or lesion Psych- euthymic mood, full affect Neuro- strength and sensation are intact  EKG is ordered. Personal review of EKG from today shows regular mostly, but P waves difficult to discern. Reviewed with Dr. Quentin Ore via phone who agrees most likely NSR.   Additional studies reviewed include: Previous EP notes.   Assessment and Plan:  1. Tachybradycardia syndrome EKG today shows NSR Continue amiodarone 200 mg daily Echo 01/03/22 LVEF 55-60%  2. Paroxysmal atrial fibrillation Appears NSR today. If can control adequately with amiodarone, patient hopes to avoid pacer.   3. CKD III Cr baseline appears 1.7 - 2.0  4. Acute on chronic diastolic CHF BMET today Consider adjusting torsemide based on results.   Follow up with Dr. Curt Bears in  4-6 weeks  . Sooner if symptoms concerning for worsening conduction.   Shirley Friar, PA-C  01/26/22 9:30 AM

## 2022-01-26 ENCOUNTER — Ambulatory Visit (INDEPENDENT_AMBULATORY_CARE_PROVIDER_SITE_OTHER): Payer: Medicare Other | Admitting: Student

## 2022-01-26 ENCOUNTER — Encounter: Payer: Self-pay | Admitting: Student

## 2022-01-26 ENCOUNTER — Other Ambulatory Visit: Payer: Self-pay

## 2022-01-26 VITALS — BP 136/50 | HR 74 | Ht 66.0 in | Wt 176.2 lb

## 2022-01-26 DIAGNOSIS — N183 Chronic kidney disease, stage 3 unspecified: Secondary | ICD-10-CM | POA: Diagnosis not present

## 2022-01-26 DIAGNOSIS — I495 Sick sinus syndrome: Secondary | ICD-10-CM | POA: Diagnosis not present

## 2022-01-26 DIAGNOSIS — I480 Paroxysmal atrial fibrillation: Secondary | ICD-10-CM

## 2022-01-26 LAB — CBC
Hematocrit: 29.7 % — ABNORMAL LOW (ref 34.0–46.6)
Hemoglobin: 9.9 g/dL — ABNORMAL LOW (ref 11.1–15.9)
MCH: 31.1 pg (ref 26.6–33.0)
MCHC: 33.3 g/dL (ref 31.5–35.7)
MCV: 93 fL (ref 79–97)
Platelets: 173 10*3/uL (ref 150–450)
RBC: 3.18 x10E6/uL — ABNORMAL LOW (ref 3.77–5.28)
RDW: 15.3 % (ref 11.7–15.4)
WBC: 3.1 10*3/uL — ABNORMAL LOW (ref 3.4–10.8)

## 2022-01-26 LAB — BASIC METABOLIC PANEL
BUN/Creatinine Ratio: 10 — ABNORMAL LOW (ref 12–28)
BUN: 18 mg/dL (ref 8–27)
CO2: 26 mmol/L (ref 20–29)
Calcium: 9.7 mg/dL (ref 8.7–10.3)
Chloride: 104 mmol/L (ref 96–106)
Creatinine, Ser: 1.79 mg/dL — ABNORMAL HIGH (ref 0.57–1.00)
Glucose: 83 mg/dL (ref 70–99)
Potassium: 3.7 mmol/L (ref 3.5–5.2)
Sodium: 143 mmol/L (ref 134–144)
eGFR: 27 mL/min/{1.73_m2} — ABNORMAL LOW (ref >59)

## 2022-01-26 NOTE — Patient Instructions (Signed)
Medication Instructions:  Your physician recommends that you continue on your current medications as directed. Please refer to the Current Medication list given to you today.  *If you need a refill on your cardiac medications before your next appointment, please call your pharmacy*   Lab Work: TODAY: BMET, CBC  If you have labs (blood work) drawn today and your tests are completely normal, you will receive your results only by: MyChart Message (if you have MyChart) OR A paper copy in the mail If you have any lab test that is abnormal or we need to change your treatment, we will call you to review the results.   Follow-Up: At [facility], you and your health needs are our priority.  As part of our continuing mission to provide you with exceptional heart care, we have created designated Provider Care Teams.  These Care Teams include your primary Cardiologist (physician) and Advanced Practice Providers (APPs -  Physician Assistants and Nurse Practitioners) who all work together to provide you with the care you need, when you need it.  We recommend signing up for the patient portal called "MyChart".  Sign up information is provided on this After Visit Summary.  MyChart is used to connect with patients for Virtual Visits (Telemedicine).  Patients are able to view lab/test results, encounter notes, upcoming appointments, etc.  Non-urgent messages can be sent to your provider as well.   To learn more about what you can do with MyChart, go to [email or web address].    Your next appointment:   As scheduled 

## 2022-02-03 ENCOUNTER — Other Ambulatory Visit: Payer: Self-pay

## 2022-02-03 MED ORDER — AMLODIPINE BESYLATE 5 MG PO TABS: 5.0000 mg | ORAL_TABLET | Freq: Every day | ORAL | 6 refills | Status: DC

## 2022-02-18 ENCOUNTER — Other Ambulatory Visit: Payer: Self-pay

## 2022-02-18 ENCOUNTER — Encounter: Payer: Self-pay | Admitting: Cardiology

## 2022-02-18 ENCOUNTER — Ambulatory Visit (INDEPENDENT_AMBULATORY_CARE_PROVIDER_SITE_OTHER): Payer: Medicare Other | Admitting: Cardiology

## 2022-02-18 VITALS — BP 134/65 | HR 70 | Ht 63.0 in | Wt 174.0 lb

## 2022-02-18 DIAGNOSIS — I5032 Chronic diastolic (congestive) heart failure: Secondary | ICD-10-CM

## 2022-02-18 DIAGNOSIS — R600 Localized edema: Secondary | ICD-10-CM | POA: Diagnosis not present

## 2022-02-18 DIAGNOSIS — N1831 Chronic kidney disease, stage 3a: Secondary | ICD-10-CM

## 2022-02-18 DIAGNOSIS — I480 Paroxysmal atrial fibrillation: Secondary | ICD-10-CM | POA: Diagnosis not present

## 2022-02-18 DIAGNOSIS — I10 Essential (primary) hypertension: Secondary | ICD-10-CM

## 2022-02-18 MED ORDER — AMIODARONE HCL 200 MG PO TABS: 200.0000 mg | ORAL_TABLET | Freq: Every day | ORAL | 3 refills | Status: DC

## 2022-02-18 NOTE — Progress Notes (Signed)
Cardiology Office Note:    Date:  02/18/2022   ID:  Christina, Morgan 06/04/1933, MRN [id number]  PCP:  Orvis Brill, [facility]  Cardiologist:  Fransico Him, MD    Referring MD: Orvis Brill, [facility]*   Chief Complaint  Patient presents with   Congestive Heart Failure   Atrial Fibrillation   Hypertension   Shortness of Breath    History of Present Illness:    Christina Morgan is a 88 y.o. female with a hx of  paroxysmal atrial fibrillation on Eliquis and flecainide, hypertension, hyperlipidemia, and chronic lower extremity edema.  She also has a history of chronic diastolic CHF with Echo showing LVEF 55-60% with elevated ventricular filling pressures and atrial filling pressures, trivial pericardial effusion and moderate aortic insufficiency. She also has chronic lower extremity edema with chronic venous insufficiency exacerbated by higher doses of amlodipine and sitting all day. She has chronic DOE that is felt to be due to sedentary state and deconditioned.    She is here today for followup and is doing well.  Since I saw her last she was hospitalized 3 times with tachybrady syndrome and atrial fibrillation with RVR and underwent DCCV x 1. She was evaluated for PPM due to HRs in the 30's but she had leg cellulitis and plan was to treat infection first.  She was started on Amio '200mg'$  BID to maintain NSR.  She was seen by EP PA a few weeks ago and was maintaining NSR and Amio was decreased to '200mg'$  daily. Hope was to avoid PPM if she can maintain NSR on amio.  Her bradycardia was during atrial fibrillation.  She sees Dr. Lennie Odor in another month.   She tells me that her chronic DOE is unchanged. She has chronic LE edema that seems to be worse recently and now has it in her left arm and hand which is new.  She has been following with [facility] and was recently started on spironolactone by her renal MD and this was stopped in the hospita due to worsening renal function from  bradydardia.  She denies any chest pain or pressure, PND, orthopnea, dizziness, palpitations or syncope. She is compliant with her meds and is tolerating meds with no SE.     Past Medical History:  Diagnosis Date   Cancer [facility])    endometrial ca   Chronic diastolic CHF (congestive heart failure) (HCC)    Chronic edema    CKD (chronic kidney disease), stage III (HCC)    Dyslipidemia    Fracture of left humerus    History of cardiovascular stress test    Lexiscan Myoview (06/2014): No ischemia or scar, not gated, low risk   Hx of echocardiogram    Echo (05/02/14): EF 60% to 65%. No regional wall motion abnormalities. Mild AI, mildly dilated aortic root (37 mm), trivial MR, trivial TR   Hyperlipidemia    Hypertension    Lipoma of skin    multiple   Osteopenia    PAF (paroxysmal atrial fibrillation) (HCC)    failed DCCV 05/2014 >> Flecainide started >> DCCV 7/15 sucessful;  f/u ETT neg for pro-arrhythmia >> recurrent AF/AFL >> Flecainide inc to 100 bid with repeat DCCV 9/15   Vitamin D deficiency 09/25/2019    Past Surgical History:  Procedure Laterality Date   CARDIOVERSION N/A 06/05/2014   Procedure: CARDIOVERSION;  Surgeon: Sueanne Margarita, MD;  Location: [place];  Service: Cardiovascular;  Laterality: N/A;   CARDIOVERSION N/A 07/11/2014   Procedure:  CARDIOVERSION;  Surgeon: Sueanne Margarita, MD;  Location: [facility] ENDOSCOPY;  Service: Cardiovascular;  Laterality: N/A;   CARDIOVERSION N/A 09/04/2014   Procedure: CARDIOVERSION;  Surgeon: Sueanne Margarita, MD;  Location: [facility] ENDOSCOPY;  Service: Cardiovascular;  Laterality: N/A;   COLONOSCOPY  2008   COLONOSCOPY N/A 05/20/2015   Procedure: COLONOSCOPY;  Surgeon: Ronald Lobo, MD;  Location: [facility] ENDOSCOPY;  Service: Endoscopy;  Laterality: N/A;   FEMUR IM NAIL Right 09/24/2019   Procedure: INTRAMEDULLARY (IM) NAIL FEMORAL;  Surgeon: Altamese [place], MD;  Location: [place];  Service: Orthopedics;  Laterality: Right;   IR KYPHO EA ADDL LEVEL THORACIC  OR LUMBAR  04/12/2019   IR KYPHO THORACIC WITH BONE BIOPSY  04/12/2019   ROBOTIC ASSISTED SUPRACERVICAL HYSTERECTOMY WITH BILATERAL SALPINGO OOPHERECTOMY  11/06/2012   and bilateral pelvic lymph node dissection    Current Medications: Current Meds  Medication Sig   acetaminophen (TYLENOL) 500 MG tablet Take 1,000 mg by mouth 3 (three) times daily as needed for moderate pain or mild pain.   albuterol (VENTOLIN HFA) 108 (90 Base) MCG/ACT inhaler Inhale 2 puffs into the lungs every 4 (four) hours as needed for wheezing or shortness of breath (please try to use ALbuterol inhaler PRIOR to Nebs).   amiodarone (PACERONE) 200 MG tablet Take 1 tablet (200 mg total) by mouth 2 (two) times daily.   amLODipine (NORVASC) 5 MG tablet Take 1 tablet (5 mg total) by mouth daily.   apixaban (ELIQUIS) 2.5 MG TABS tablet Take 1 tablet (2.5 mg total) by mouth 2 (two) times daily.   atorvastatin (LIPITOR) 10 MG tablet Take 1 tablet (10 mg total) by mouth at bedtime.   Cholecalciferol (VITAMIN D) 125 MCG (5000 UT) CAPS Take 125 mcg by mouth daily.   DOCUSATE SODIUM PO Take 1 capsule by mouth as needed.   hydrALAZINE (APRESOLINE) 50 MG tablet Take 1 tablet (50 mg total) by mouth every 8 (eight) hours.   letrozole (FEMARA) 2.5 MG tablet Take 2.5 mg by mouth daily.   loperamide (IMODIUM A-D) 2 MG tablet Take 2 mg by mouth every 6 (six) hours as needed for diarrhea or loose stools. Do not exceed '8mg'$  in 24 hours.   Multiple Vitamin (DAILY VITE PO) Take 1 tablet by mouth daily.   NYSTATIN powder Apply 1 g topically See admin instructions. Spread topically to area under bilateral breasts & groin twice a day at 8 AM and 8 PM   omeprazole (PRILOSEC) 20 MG capsule Take 20 mg by mouth daily.   torsemide (DEMADEX) 10 MG tablet Take 10 mg by mouth every Monday, Wednesday, and Friday.   torsemide (DEMADEX) 20 MG tablet Take 10 mg by mouth daily as needed (weight gain or 5lbs in 1 week or swelling). This is in conjunction with the  daily Torsemide patient takes.   vitamin B-12 (CYANOCOBALAMIN) 250 MCG tablet Take 250 mcg by mouth every other day.   vitamin C (VITAMIN C) 500 MG tablet Take 1 tablet (500 mg total) by mouth daily.     Allergies:   Penicillins   Social History   Socioeconomic History   Marital status: Widowed    Spouse name: Not on file   Number of children: Not on file   Years of education: Not on file   Highest education level: Not on file  Occupational History   Not on file  Tobacco Use   Smoking status: Never    Passive exposure: Past   Smokeless tobacco: Never  Vaping  Use   Vaping Use: Never used  Substance and Sexual Activity   Alcohol use: No   Drug use: No   Sexual activity: Not on file  Other Topics Concern   Not on file  Social History Narrative   ** Merged History Encounter **       Social Determinants of Health   Financial Resource Strain: Not on file  Food Insecurity: Not on file  Transportation Needs: Not on file  Physical Activity: Not on file  Stress: Not on file  Social Connections: Not on file     Family History: The patient's family history includes Cancer (age of onset: 46) in her father; Coronary artery disease in her mother; Heart attack in her mother.  ROS:   Please see the history of present illness.    ROS  All other systems reviewed and negative.   EKGs/Labs/Other Studies Reviewed:    The following studies were reviewed today: nond  EKG:  EKG is not ordered today  Recent Labs: 12/15/2021: ALT 25 01/03/2022: B Natriuretic Peptide 544.3; Magnesium 2.4; TSH 5.147 01/26/2022: BUN 18; Creatinine, Ser 1.79; Hemoglobin 9.9; Platelets 173; Potassium 3.7; Sodium 143   Recent Lipid Panel    Component Value Date/Time   CHOL 175 03/21/2016 0915   TRIG 155 (H) 03/21/2016 0915   HDL 63 03/21/2016 0915   CHOLHDL 2.8 03/21/2016 0915   VLDL 31 (H) 03/21/2016 0915   LDLCALC 81 03/21/2016 0915    Physical Exam:    VS:  BP 134/65    Pulse 70    Ht '5\' 3"'$   (1.6 m)    Wt 174 lb (78.9 kg)    SpO2 95%    BMI 30.82 kg/m     Wt Readings from Last 3 Encounters:  02/18/22 174 lb (78.9 kg)  01/26/22 176 lb 3.2 oz (79.9 kg)  01/05/22 167 lb 12.3 oz (76.1 kg)  GEN: Well nourished, well developed in no acute distress HEENT: Normal NECK: No JVD; No carotid bruits LYMPHATICS: No lymphadenopathy CARDIAC:RRR, no murmurs, rubs, gallops RESPIRATORY:  Clear to auscultation without rales, wheezing or rhonchi  ABDOMEN: Soft, non-tender, non-distended MUSCULOSKELETAL:  left upper extremity edema; No deformity.  Legs are wrapped SKIN: Warm and dry NEUROLOGIC:  Alert and oriented x 3 PSYCHIATRIC:  Normal affect   ASSESSMENT:    1. Chronic diastolic congestive heart failure ([place])   2. PAF (paroxysmal atrial fibrillation) ([place])   3. Essential hypertension, benign   4. Lower extremity edema   5. Stage 3a chronic kidney disease (HCC)    PLAN:    In order of problems listed above:  Chronic diastolic CHF -2D echo with normal LVF and diastolic dysfunction -has chronic LE edema secondary to chronic venous insuff and chronic dependent state and this remains stable -She has swelling in her left upper arm in the posterior aspect and ? Whether this is dependent edema from keeping her arm hanging down vs. 3rd spacing from low albumin -I will check a BNP, albumin, TSH, BMET -Continue prescription drug management with torsemide 10 mg every Monday Wednesday and Friday and then 10 mg as needed for volume overload -I have personally reviewed and interpreted outside labs performed by patient's PCP which showed serum creatinine 1.79 and potassium 3.7 on 01/26/2022 -I do not feel comfortable with titrating her diuretics given her underlying CKD and will get her back in to nephrology ASAP  Paroxysmal atrial fibrillation -She continues to maintain normal sinus rhythm on exam and denies any palpitations -  I have personally reviewed and interpreted outside labs performed by  patient's PCP which showed hemoglobin 9.9 on 01/26/2022.  TSH was 5.147 on 01/03/2022 and ALT 25 on 12/15/2021 -She gets yearly eye exams -Check PFTs with [facility] for Amio monitoring -Continue prescription drug management with apixaban 2.5 mg twice daily (dosed for age > 80 and SCr > 1.5) with as needed refills -decrease Amio to '200mg'$  daily   HTN -BP is adequately controlled on exam today -New prescription drug management hydralazine 50 mg every 8 hours, amlodipine 5 mg daily with as needed refills -outside labs ordered by PCP were reviewed and interpreted by me showing slightly increased SCr of 1.73 and K+ 5.1 -she is seeing nephrology   Chronic LE edema -exacerbated by dietary indiscretion with Na as well as chronic venous insuff and sitting all day with legs hanging down -continue diuretics and thigh high compression hose -I have encouraged her to continue with her compression hose during the day -she will continue on Torsemide '10mg'$  every Monday Wednesday and Friday   Chronic DOE -2D echo 01/2019 showed normal LVF with increased stiffness of heart muscle, severe LAE -lexiscan myoview 12/2018 showed no ischemia -felt to be multifactorial from obesity, sedentary state with deconditioning -her SOB is very stable   CKD stage 3a -creatinine baseline is now 1.7 -She is followed by nephrology     Medication Adjustments/Labs and Tests Ordered: Current medicines are reviewed at length with the patient today.  Concerns regarding medicines are outlined above.  No orders of the defined types were placed in this encounter.  No orders of the defined types were placed in this encounter.   Signed, Fransico Him, MD  02/18/2022 1:27 PM    [facility]

## 2022-02-18 NOTE — Patient Instructions (Addendum)
Medication Instructions:  ?DECREASE amiodarone to 200 mg by mouth daily. ?*If you need a refill on your cardiac medications before your next appointment, please call your pharmacy* ? ? ?Lab Work: ?BMET, BNP, TSH, ALBUMIN ?If you have labs (blood work) drawn today and your tests are completely normal, you will receive your results only by: ?MyChart Message (if you have MyChart) OR ?A paper copy in the mail ?If you have any lab test that is abnormal or we need to change your treatment, we will call you to review the results. ? ? ?Testing/Procedures: ?Your physician has requested that you have a upper extremity venous duplex. This test is an ultrasound of the veins in the arms. It looks at venous blood flow that carries blood from the heart to the arms. Allow thirty minutes for an Upper Venous exam. There are no restrictions or special instructions. ? ?Your physician has recommended that you have a pulmonary function test. Pulmonary Function Tests are a group of tests that measure how well air moves in and out of your lungs.  ? ? ? ? ?Follow-Up: ?At [facility], you and your health needs are our priority.  As part of our continuing mission to provide you with exceptional heart care, we have created designated Provider Care Teams.  These Care Teams include your primary Cardiologist (physician) and Advanced Practice Providers (APPs -  Physician Assistants and Nurse Practitioners) who all work together to provide you with the care you need, when you need it. ? ?We recommend signing up for the patient portal called "MyChart".  Sign up information is provided on this After Visit Summary.  MyChart is used to connect with patients for Virtual Visits (Telemedicine).  Patients are able to view lab/test results, encounter notes, upcoming appointments, etc.  Non-urgent messages can be sent to your provider as well.   ?To learn more about what you can do with MyChart, go to [email or web address].   ? ?Your next appointment:    ?3 month(s) ? ?The format for your next appointment:   ?In Person ? ?Provider:   ?Fransico Him, MD   ? ? ?Other Instructions ?  ?

## 2022-02-18 NOTE — Addendum Note (Signed)
Addended by: Gar Ponto on: 02/18/2022 01:39 PM ? ? Modules accepted: Orders ? ?

## 2022-02-19 LAB — ALBUMIN: Albumin: 3.6 g/dL (ref 3.6–4.6)

## 2022-02-19 LAB — BASIC METABOLIC PANEL
BUN/Creatinine Ratio: 12 (ref 12–28)
BUN: 19 mg/dL (ref 8–27)
CO2: 26 mmol/L (ref 20–29)
Calcium: 9.3 mg/dL (ref 8.7–10.3)
Chloride: 108 mmol/L — ABNORMAL HIGH (ref 96–106)
Creatinine, Ser: 1.61 mg/dL — ABNORMAL HIGH (ref 0.57–1.00)
Glucose: 120 mg/dL — ABNORMAL HIGH (ref 70–99)
Potassium: 3.7 mmol/L (ref 3.5–5.2)
Sodium: 148 mmol/L — ABNORMAL HIGH (ref 134–144)
eGFR: 31 mL/min/{1.73_m2} — ABNORMAL LOW (ref >59)

## 2022-02-19 LAB — PRO B NATRIURETIC PEPTIDE: NT-Pro BNP: 264 pg/mL (ref 0–738)

## 2022-02-19 LAB — TSH: TSH: 10.4 u[IU]/mL — ABNORMAL HIGH (ref 0.450–4.500)

## 2022-02-22 ENCOUNTER — Encounter (HOSPITAL_COMMUNITY): Payer: Medicare Other

## 2022-02-23 ENCOUNTER — Telehealth: Payer: Self-pay | Admitting: Cardiology

## 2022-02-23 NOTE — Telephone Encounter (Signed)
Attempted phone call to pt's daughter,DPR and advised per her request will send lab results to [place] and if she has further questions to please contact office at [phone]. ?

## 2022-02-23 NOTE — Telephone Encounter (Signed)
Patient's daughter is returning call to discuss lab results. She states she recently set up [place] and a message on MyChart would be fine if she is unavailable when her call is returned. ?

## 2022-02-25 ENCOUNTER — Encounter (HOSPITAL_COMMUNITY): Payer: Medicare Other

## 2022-03-06 ENCOUNTER — Emergency Department (HOSPITAL_BASED_OUTPATIENT_CLINIC_OR_DEPARTMENT_OTHER): Payer: Medicare Other

## 2022-03-06 ENCOUNTER — Other Ambulatory Visit: Payer: Self-pay

## 2022-03-06 ENCOUNTER — Inpatient Hospital Stay (HOSPITAL_BASED_OUTPATIENT_CLINIC_OR_DEPARTMENT_OTHER)
Admission: EM | Admit: 2022-03-06 | Discharge: 2022-03-11 | DRG: 291 | Disposition: A | Discharge status: Home Health | Payer: Medicare Other | Attending: Family Medicine | Admitting: Family Medicine

## 2022-03-06 ENCOUNTER — Encounter (HOSPITAL_BASED_OUTPATIENT_CLINIC_OR_DEPARTMENT_OTHER): Payer: Self-pay

## 2022-03-06 DIAGNOSIS — K5900 Constipation, unspecified: Secondary | ICD-10-CM

## 2022-03-06 DIAGNOSIS — Z862 Personal history of diseases of the blood and blood-forming organs and certain disorders involving the immune mechanism: Secondary | ICD-10-CM | POA: Diagnosis not present

## 2022-03-06 DIAGNOSIS — D631 Anemia in chronic kidney disease: Secondary | ICD-10-CM | POA: Diagnosis present

## 2022-03-06 DIAGNOSIS — E860 Dehydration: Secondary | ICD-10-CM | POA: Diagnosis present

## 2022-03-06 DIAGNOSIS — N1832 Chronic kidney disease, stage 3b: Secondary | ICD-10-CM | POA: Diagnosis present

## 2022-03-06 DIAGNOSIS — Z7901 Long term (current) use of anticoagulants: Secondary | ICD-10-CM

## 2022-03-06 DIAGNOSIS — Z8616 Personal history of COVID-19: Secondary | ICD-10-CM | POA: Diagnosis not present

## 2022-03-06 DIAGNOSIS — D696 Thrombocytopenia, unspecified: Secondary | ICD-10-CM | POA: Diagnosis not present

## 2022-03-06 DIAGNOSIS — R7401 Elevation of levels of liver transaminase levels: Secondary | ICD-10-CM

## 2022-03-06 DIAGNOSIS — I480 Paroxysmal atrial fibrillation: Secondary | ICD-10-CM | POA: Diagnosis present

## 2022-03-06 DIAGNOSIS — Z880 Allergy status to penicillin: Secondary | ICD-10-CM | POA: Diagnosis not present

## 2022-03-06 DIAGNOSIS — L89626 Pressure-induced deep tissue damage of left heel: Secondary | ICD-10-CM | POA: Diagnosis not present

## 2022-03-06 DIAGNOSIS — Z79899 Other long term (current) drug therapy: Secondary | ICD-10-CM | POA: Diagnosis not present

## 2022-03-06 DIAGNOSIS — E785 Hyperlipidemia, unspecified: Secondary | ICD-10-CM | POA: Diagnosis present

## 2022-03-06 DIAGNOSIS — E8770 Fluid overload, unspecified: Secondary | ICD-10-CM | POA: Insufficient documentation

## 2022-03-06 DIAGNOSIS — K219 Gastro-esophageal reflux disease without esophagitis: Secondary | ICD-10-CM | POA: Diagnosis not present

## 2022-03-06 DIAGNOSIS — N179 Acute kidney failure, unspecified: Secondary | ICD-10-CM | POA: Diagnosis not present

## 2022-03-06 DIAGNOSIS — Z6829 Body mass index (BMI) 29.0-29.9, adult: Secondary | ICD-10-CM

## 2022-03-06 DIAGNOSIS — I130 Hypertensive heart and chronic kidney disease with heart failure and stage 1 through stage 4 chronic kidney disease, or unspecified chronic kidney disease: Secondary | ICD-10-CM | POA: Diagnosis present

## 2022-03-06 DIAGNOSIS — Z8249 Family history of ischemic heart disease and other diseases of the circulatory system: Secondary | ICD-10-CM

## 2022-03-06 DIAGNOSIS — I5033 Acute on chronic diastolic (congestive) heart failure: Secondary | ICD-10-CM | POA: Diagnosis not present

## 2022-03-06 DIAGNOSIS — J9811 Atelectasis: Secondary | ICD-10-CM | POA: Diagnosis present

## 2022-03-06 DIAGNOSIS — R630 Anorexia: Secondary | ICD-10-CM | POA: Diagnosis present

## 2022-03-06 DIAGNOSIS — N390 Urinary tract infection, site not specified: Secondary | ICD-10-CM | POA: Diagnosis not present

## 2022-03-06 DIAGNOSIS — Z808 Family history of malignant neoplasm of other organs or systems: Secondary | ICD-10-CM

## 2022-03-06 DIAGNOSIS — I440 Atrioventricular block, first degree: Secondary | ICD-10-CM | POA: Diagnosis present

## 2022-03-06 DIAGNOSIS — G9341 Metabolic encephalopathy: Secondary | ICD-10-CM | POA: Diagnosis not present

## 2022-03-06 DIAGNOSIS — N189 Chronic kidney disease, unspecified: Secondary | ICD-10-CM | POA: Diagnosis not present

## 2022-03-06 DIAGNOSIS — R112 Nausea with vomiting, unspecified: Secondary | ICD-10-CM | POA: Diagnosis not present

## 2022-03-06 DIAGNOSIS — J90 Pleural effusion, not elsewhere classified: Secondary | ICD-10-CM

## 2022-03-06 DIAGNOSIS — D72825 Bandemia: Secondary | ICD-10-CM | POA: Diagnosis not present

## 2022-03-06 DIAGNOSIS — D72829 Elevated white blood cell count, unspecified: Secondary | ICD-10-CM | POA: Diagnosis not present

## 2022-03-06 LAB — URINALYSIS, ROUTINE W REFLEX MICROSCOPIC
Bilirubin Urine: NEGATIVE
Glucose, UA: NEGATIVE mg/dL
Ketones, ur: 15 mg/dL — AB
Nitrite: NEGATIVE
Protein, ur: 30 mg/dL — AB
Specific Gravity, Urine: 1.025 (ref 1.005–1.030)
pH: 5 (ref 5.0–8.0)

## 2022-03-06 LAB — URINALYSIS, MICROSCOPIC (REFLEX)

## 2022-03-06 LAB — COMPREHENSIVE METABOLIC PANEL
ALT: 24 U/L (ref 0–44)
AST: 47 U/L — ABNORMAL HIGH (ref 15–41)
Albumin: 3.2 g/dL — ABNORMAL LOW (ref 3.5–5.0)
Alkaline Phosphatase: 85 U/L (ref 38–126)
Anion gap: 10 (ref 5–15)
BUN: 29 mg/dL — ABNORMAL HIGH (ref 8–23)
CO2: 26 mmol/L (ref 22–32)
Calcium: 9.1 mg/dL (ref 8.9–10.3)
Chloride: 106 mmol/L (ref 98–111)
Creatinine, Ser: 2.12 mg/dL — ABNORMAL HIGH (ref 0.44–1.00)
GFR, Estimated: 22 mL/min — ABNORMAL LOW (ref >60)
Glucose, Bld: 108 mg/dL — ABNORMAL HIGH (ref 70–99)
Potassium: 4 mmol/L (ref 3.5–5.1)
Sodium: 142 mmol/L (ref 135–145)
Total Bilirubin: 1 mg/dL (ref 0.3–1.2)
Total Protein: 6.3 g/dL — ABNORMAL LOW (ref 6.5–8.1)

## 2022-03-06 LAB — LIPASE, BLOOD: Lipase: 60 U/L — ABNORMAL HIGH (ref 11–51)

## 2022-03-06 LAB — BRAIN NATRIURETIC PEPTIDE: B Natriuretic Peptide: 62.2 pg/mL (ref 0.0–100.0)

## 2022-03-06 LAB — TROPONIN I (HIGH SENSITIVITY): Troponin I (High Sensitivity): 12 ng/L (ref <18)

## 2022-03-06 MED ORDER — FUROSEMIDE 10 MG/ML IJ SOLN
40.0000 mg | Freq: Once | INTRAMUSCULAR | Status: AC
  Administered 2022-03-06: 40 mg via INTRAVENOUS
  Filled 2022-03-06: qty 4

## 2022-03-06 MED ORDER — ATORVASTATIN CALCIUM 10 MG PO TABS
10.0000 mg | ORAL_TABLET | Freq: Every day | ORAL | Status: DC
  Administered 2022-03-07 – 2022-03-10 (×5): 10 mg via ORAL
  Filled 2022-03-06 (×5): qty 1

## 2022-03-06 MED ORDER — ONDANSETRON HCL 4 MG/2ML IJ SOLN
4.0000 mg | Freq: Four times a day (QID) | INTRAMUSCULAR | Status: DC | PRN
Start: 2022-03-06 — End: 2022-03-11

## 2022-03-06 MED ORDER — AMIODARONE HCL 200 MG PO TABS
200.0000 mg | ORAL_TABLET | Freq: Every day | ORAL | Status: DC
  Administered 2022-03-07 – 2022-03-11 (×5): 200 mg via ORAL
  Filled 2022-03-06 (×5): qty 1

## 2022-03-06 MED ORDER — ACETAMINOPHEN 325 MG PO TABS: 650.0000 mg | ORAL_TABLET | Freq: Four times a day (QID) | ORAL | Status: DC | PRN

## 2022-03-06 MED ORDER — FUROSEMIDE 10 MG/ML IJ SOLN
40.0000 mg | Freq: Two times a day (BID) | INTRAMUSCULAR | Status: DC
  Administered 2022-03-07 – 2022-03-09 (×5): 40 mg via INTRAVENOUS
  Filled 2022-03-06 (×5): qty 4

## 2022-03-06 MED ORDER — HYDRALAZINE HCL 50 MG PO TABS
50.0000 mg | ORAL_TABLET | Freq: Three times a day (TID) | ORAL | Status: DC
  Administered 2022-03-07 – 2022-03-11 (×12): 50 mg via ORAL
  Filled 2022-03-06 (×13): qty 1

## 2022-03-06 MED ORDER — DOCUSATE SODIUM 100 MG PO CAPS
100.0000 mg | ORAL_CAPSULE | Freq: Two times a day (BID) | ORAL | Status: DC
  Administered 2022-03-07 – 2022-03-08 (×3): 100 mg via ORAL
  Filled 2022-03-06 (×3): qty 1

## 2022-03-06 MED ORDER — ACETAMINOPHEN 650 MG RE SUPP: 650.0000 mg | Freq: Four times a day (QID) | RECTAL | Status: DC | PRN

## 2022-03-06 MED ORDER — APIXABAN 2.5 MG PO TABS
2.5000 mg | ORAL_TABLET | Freq: Two times a day (BID) | ORAL | Status: DC
  Administered 2022-03-07 – 2022-03-11 (×10): 2.5 mg via ORAL
  Filled 2022-03-06 (×10): qty 1

## 2022-03-06 MED ORDER — PANTOPRAZOLE SODIUM 40 MG PO TBEC
40.0000 mg | DELAYED_RELEASE_TABLET | Freq: Every day | ORAL | Status: DC
Start: 2022-03-07 — End: 2022-03-11
  Administered 2022-03-07 – 2022-03-11 (×5): 40 mg via ORAL
  Filled 2022-03-06 (×5): qty 1

## 2022-03-06 NOTE — ED Notes (Signed)
Unable to obtain oral temperature, thermometer not picking up temp. Tympanic thermometer used ?

## 2022-03-06 NOTE — ED Notes (Signed)
[facility] to bedside to assume care of patient. ?

## 2022-03-06 NOTE — ED Notes (Signed)
Report given to [facility].  ETA 25-30 min.  Report attempted to floor.  Nurse unavailable.  Left message.  Nurse to call this ED back for report.  ?

## 2022-03-06 NOTE — ED Provider Notes (Signed)
?[place] EMERGENCY DEPARTMENT ?Provider Note ? ? ?CSN: [id number] ?Arrival date & time: 03/06/22  1537 ? ?  ? ?History ? ?Chief Complaint  ?Patient presents with  ? Vomiting  ? Constipation  ? ? ?Christina Morgan is a 88 y.o. female.  Presents to the emergency department with concern for vomiting, constipation as well as shortness of breath and increasing leg swelling.  Patient states that currently she does not feel short of breath but the daughter states that she has been complaining of intermittent episodes of shortness of breath, had a bad episode last night.  Daughter reports that she has had increasing leg swelling, extending up to her thighs.  Have been doing wraps at her facility.  Has been taking the torsemide as prescribed.  States that her cardiologist and nephrologist have been following her closely.  Patient reports that she has been constipated for about a week, no BMs that were significant for the past week.  Today had an episode of vomiting and nausea.  No current nausea or ongoing vomiting at present.  No associated abdominal pain. ? ?HPI ? ?  ? ?Home Medications ?Prior to Admission medications   ?Medication Sig Start Date End Date Taking? Authorizing Provider  ?acetaminophen (TYLENOL) 500 MG tablet Take 1,000 mg by mouth 3 (three) times daily as needed for moderate pain or mild pain.    [provider]  ?albuterol (VENTOLIN HFA) 108 (90 Base) MCG/ACT inhaler Inhale 2 puffs into the lungs every 4 (four) hours as needed for wheezing or shortness of breath (please try to use ALbuterol inhaler PRIOR to Nebs). 10/11/19   Angiulli, Lavon Paganini, PA-C  ?amiodarone (PACERONE) 200 MG tablet Take 1 tablet (200 mg total) by mouth daily. 02/18/22   Sueanne Margarita, MD  ?amLODipine (NORVASC) 5 MG tablet Take 1 tablet (5 mg total) by mouth daily. 02/03/22   Sueanne Margarita, MD  ?apixaban (ELIQUIS) 2.5 MG TABS tablet Take 1 tablet (2.5 mg total) by mouth 2 (two) times daily. 04/26/21   Sueanne Margarita, MD  ?atorvastatin (LIPITOR) 10 MG tablet Take 1 tablet (10 mg total) by mouth at bedtime. 04/26/21   Sueanne Margarita, MD  ?Cholecalciferol (VITAMIN D) 125 MCG (5000 UT) CAPS Take 125 mcg by mouth daily.    [provider]  ?DOCUSATE SODIUM PO Take 1 capsule by mouth as needed.    [provider]  ?hydrALAZINE (APRESOLINE) 50 MG tablet Take 1 tablet (50 mg total) by mouth every 8 (eight) hours. 01/17/22   Sueanne Margarita, MD  ?letrozole [facility]) 2.5 MG tablet Take 2.5 mg by mouth daily.    [provider]  ?loperamide (IMODIUM A-D) 2 MG tablet Take 2 mg by mouth every 6 (six) hours as needed for diarrhea or loose stools. Do not exceed '8mg'$  in 24 hours.    [provider]  ?Multiple Vitamin (DAILY VITE PO) Take 1 tablet by mouth daily.    [provider]  ?NYSTATIN powder Apply 1 g topically See admin instructions. Spread topically to area under bilateral breasts & groin twice a day at 8 AM and 8 PM 01/29/20   [provider]  ?omeprazole (PRILOSEC) 20 MG capsule Take 20 mg by mouth daily. 04/14/21   [provider]  ?torsemide (DEMADEX) 10 MG tablet Take 10 mg by mouth every Monday, Wednesday, and Friday. 11/15/21   [provider]  ?torsemide (DEMADEX) 20 MG tablet Take 10 mg by mouth daily as  needed (weight gain or 5lbs in 1 week or swelling). This is in conjunction with the daily Torsemide patient takes.    [provider]  ?vitamin B-12 (CYANOCOBALAMIN) 250 MCG tablet Take 250 mcg by mouth every other day.    [provider]  ?vitamin C (VITAMIN C) 500 MG tablet Take 1 tablet (500 mg total) by mouth daily. 09/28/19   Antonieta Pert, MD  ?   ? ?Allergies    ?Penicillins   ? ?Review of Systems   ?Review of Systems  ?Constitutional:  Negative for chills and fever.  ?HENT:  Negative for ear pain and sore throat.   ?Eyes:  Negative for pain and visual disturbance.  ?Respiratory:  Negative for cough and shortness of breath.    ?Cardiovascular:  Negative for chest pain and palpitations.  ?Gastrointestinal:  Positive for nausea and vomiting. Negative for abdominal pain.  ?Genitourinary:  Negative for dysuria and hematuria.  ?Musculoskeletal:  Negative for arthralgias and back pain.  ?Skin:  Negative for color change and rash.  ?Neurological:  Negative for seizures and syncope.  ?All other systems reviewed and are negative. ? ?Physical Exam ?Updated Vital Signs ?BP 139/61   Pulse 69   Temp (!) 96.8 ?F (36 ?C) (Tympanic)   Resp 20   Ht '5\' 3"'$  (1.6 m)   Wt 74.8 kg   SpO2 93%   BMI 29.23 kg/m?  ?Physical Exam ?Vitals and nursing note reviewed.  ?Constitutional:   ?   General: She is not in acute distress. ?   Appearance: She is well-developed.  ?HENT:  ?   Head: Normocephalic and atraumatic.  ?Eyes:  ?   Conjunctiva/sclera: Conjunctivae normal.  ?Cardiovascular:  ?   Rate and Rhythm: Normal rate and regular rhythm.  ?   Heart sounds: No murmur heard. ?Pulmonary:  ?   Effort: Pulmonary effort is normal. No respiratory distress.  ?   Breath sounds: Normal breath sounds.  ?Abdominal:  ?   Palpations: Abdomen is soft.  ?   Tenderness: There is no abdominal tenderness.  ?Musculoskeletal:  ?   Cervical back: Neck supple.  ?   Comments: Pitting edema extending up to level of thighs bilaterally  ?Skin: ?   General: Skin is warm and dry.  ?   Capillary Refill: Capillary refill takes less than 2 seconds.  ?Neurological:  ?   Mental Status: She is alert.  ?Psychiatric:     ?   Mood and Affect: Mood normal.  ? ? ?ED Results / Procedures / Treatments   ?Labs ?(all labs ordered are listed, but only abnormal results are displayed) ?Labs Reviewed  ?CBC WITH DIFFERENTIAL/PLATELET - Abnormal; Notable for the following components:  ?    Result Value  ? WBC 11.9 (*)   ? RBC 3.43 (*)   ? Hemoglobin 11.0 (*)   ? HCT 32.9 (*)   ? RDW 17.3 (*)   ? Platelets 147 (*)   ? Neutro Abs 10.7 (*)   ? Lymphs Abs 0.2 (*)   ? Abs Immature Granulocytes 0.09 (*)   ? All  other components within normal limits  ?COMPREHENSIVE METABOLIC PANEL - Abnormal; Notable for the following components:  ? Glucose, Bld 108 (*)   ? BUN 29 (*)   ? Creatinine, Ser 2.12 (*)   ? Total Protein 6.3 (*)   ? Albumin 3.2 (*)   ? AST 47 (*)   ? GFR, Estimated 22 (*)   ? All other components within normal  limits  ?LIPASE, BLOOD - Abnormal; Notable for the following components:  ? Lipase 60 (*)   ? All other components within normal limits  ?BRAIN NATRIURETIC PEPTIDE  ?URINALYSIS, ROUTINE W REFLEX MICROSCOPIC  ?TROPONIN I (HIGH SENSITIVITY)  ? ? ?EKG ?EKG Interpretation ? ?Date/Time:  Sunday March 06 2022 15:53:41 EDT ?Ventricular Rate:  72 ?PR Interval:  212 ?QRS Duration: 90 ?QT Interval:  432 ?QTC Calculation: 473 ?R Axis:   81 ?Text Interpretation: Sinus rhythm with 1st degree A-V block with Premature supraventricular complexes Otherwise normal ECG When compared with ECG of 04-Jan-2022 06:45, PREVIOUS ECG IS PRESENT Confirmed by Madalyn Rob [phone]) on 03/06/2022 4:32:30 PM ? ?Radiology ?CT ABDOMEN PELVIS WO CONTRAST ? ?Result Date: 03/06/2022 ?CLINICAL DATA:  Abdominal pain, vomiting, shortness of breath EXAM: CT ABDOMEN AND PELVIS WITHOUT CONTRAST TECHNIQUE: Multidetector CT imaging of the abdomen and pelvis was performed following the standard protocol without IV contrast. Unenhanced CT was performed per clinician order. Lack of IV contrast limits sensitivity and specificity, especially for evaluation of abdominal/pelvic solid viscera. RADIATION DOSE REDUCTION: This exam was performed according to the departmental dose-optimization program which includes automated exposure control, adjustment of the mA and/or kV according to patient size and/or use of iterative reconstruction technique. COMPARISON:  06/19/2014 FINDINGS: Lower chest: Small bilateral pleural effusions, left greater than right. Compressive atelectasis within the bilateral lower lobes. Mild cardiomegaly without pericardial effusion.  Hepatobiliary: High attenuation material within the gallbladder compatible with sludge. No evidence of calcified gallstones or cholecystitis. Unremarkable unenhanced appearance of the liver. Pancreas: Unremarkable. No pancrea

## 2022-03-06 NOTE — H&P (Signed)
?History and Physical  ? ? ?PLEASE NOTE THAT [employer] SOFTWARE WAS USED IN THE CONSTRUCTION OF THIS NOTE. ? ? ?Christina Morgan AYO:[id number] DOB: 08-26-1933 DOA: 03/06/2022 ? ?PCP: [facility]  ?Patient coming from: home  ? ?I have personally briefly reviewed patient's old medical records in [place] ? ?Chief Complaint:.  Shortness of breath ? ?HPI: Christina Morgan is a 88 y.o. female with medical history significant for chronic diastolic heart failure, chronic right ventricular systolic heart failure, paroxysmal atrial fibrillation chronically anticoagulated on Eliquis, hypertension, hyperlipidemia, stage IIIb chronic kidney disease cc with baseline creatinine 1.6-1.9, chronic anemia with baseline hemoglobin 9-11, who is admitted to [facility] on 03/06/2022 by way of transfer from [place] emergency department with acute on chronic diastolic heart failure after presenting from home to to the latter facility complaining of shortness of breath. ? ?The patient reports 1 week of progressive shortness of breath associated with orthopnea and interval worsening of her chronic edema in the bilateral lower extremities.  She notes associated new onset nonproductive cough.  Denies any associated chest pain, palpitations, diaphoresis, dizziness, presyncope, or syncope.   ? ?She does note intermittent nausea over the last 3 to 4 days, resulting in 2-3 episodes of nonbloody, nonbilious emesis, with most recent such episode occurring earlier today.  Denies any associated diarrhea or any recent melena or hematochezia.  Not associate with any abdominal discomfort.  Denies recent trauma or travel.  She also denies any recent dysuria or gross hematuria.  Not associate any urinary urgency, or change in urinary frequency.  ? ?No signs of any subjective fever, chills, rigors, or generalized myalgias.  No headache, neck stiffness.  ? ?She has a documented history of both chronic  diastolic heart failure as well as chronic right ventricular systolic heart failure.  Most recent echocardiogram occurred on 01/03/2022 was notable for LVEF 55 to 60%, no for wall motion rallies, mild LVH, indeterminate diastolic parameters, moderately reduced right ventricular systolic function, moderately enlarged right ventricle, trivial mitral gravitation, mild to moderate aortic regurgitation, mild aortic stenosis.  Preceding echocardiogram occurred in March 2021, and showed grade 2 diastolic dysfunction.  She reports good compliance with her outpatient torsemide 10 mg p.o. daily.  She also notes taking an extra dose of her torsemide twice in the last week in the setting of the above symptoms and worsening of edema in the bilateral lower extremities, but noted no significant improvement in the above symptoms with this measure. ? ?She was admitted into the [facility] health system in January 2023 to the cardiology service for acute on chronic diastolic heart failure, at which time she also had COVID-19.  She has subsequently been following with her outpatient cardiologist, reportedly placed referral to outpatient nephrology in the setting of the patient's history of stage IIIb chronic kidney disease associated with baseline creatinine range of 1.6-1.9.  The patient conveys that she has not yet had her establishment of care appointment via this outpatient nephrology referral.  Most recent prior serum creatinine data point noted to be 1.61 on 02/18/2022. ? ?She also confirms history of paroxysmal atrial fibrillation for which she reports good compliance with her chronic anticoagulation on Eliquis.  ? ? ? ? ?[facility] ED Course:  ?Vital signs in the ED were notable for the following: Afebrile; heart rate 66-68; blood pressure 127/63 - 144/61; respiratory rate 18-21, oxygen saturation 92 to 95% on room air. ? ?Labs were notable for the  following: CMP was notable for the following: Bicarbonate 26, BUN 29,  creatinine 2.12, AST 47, otherwise liver enzymes within normal limits.  BNP 62 compared to 544 in January 23; high-sensitivity troponin I x1 value noted to be 12.  CBC notable for the following: Lipid cell count 11,900 with 90% neutrophils, compared to most recent prior value of 3.1 on 01/26/2022, hemoglobin 11 compared to 9.9 on 01/26/2022.  Urinalysis notable for 11-20 white blood cells, rare bacteria, nitrite negative, trace hemoglobin with 6-10 red blood cells, 30 protein. ? ?Imaging and additional notable ED work-up: EKG showed sinus rhythm with first-degree AV block, PACs, heart rate 72, no evidence of T wave or ST changes, including no evidence of ST elevation. ? ?2 view chest x-ray, relative to most recent prior chest x-ray taken on 01/02/2022 at time of admission for acute on chronic diastolic heart failure, shows cardiomegaly with increase in interstitial opacities concerning for interstitial edema, while showing left basilar atelectasis, small bilateral pleural effusions, but no evidence of overt infiltrate, or evidence of pneumothorax. ? ?CT abdomen/pelvis showed evidence of bilateral pleural effusions, bilateral lower lobe atelectasis, left greater than right, diverticulosis without evidence of diverticulitis; no evidence of constipation or obstruction; no evidence of gallbladder wall thickening, pericholecystic fluid, gallbladder distention, common bile duct dilation, or choledocholithiasis.  CT abdomen/pelvis also showed a normal-appearing pancreas. ? ?ED PA [facility] discussed the patient's case with the on-call nephrologist, Dr. Jonnie Finner, Who recommended hospitalization for IV diuresis, making specific recommendation for Lasix 40 to 60 mg IV twice daily. ? ?While in the ED, the following were administered: Lasix 40 mg IV x1. ? ?Subsequently, the patient was transferred to [facility] for inpatient admission to the med telemetry for further evaluation and management of acute on  chronic diastolic heart failure associated with acute kidney injury superimposed on stage III CKD, with presenting labs also notable for mild leukocytosis.  ? ? ? ? ?Review of Systems: As per HPI otherwise 10 point review of systems negative.  ? ?Past Medical History:  ?Diagnosis Date  ? Cancer [facility])   ? endometrial ca  ? Chronic diastolic CHF (congestive heart failure) ([place])   ? Chronic edema   ? CKD (chronic kidney disease), stage III ([place])   ? Dyslipidemia   ? Fracture of left humerus   ? History of cardiovascular stress test   ? Lexiscan Myoview (06/2014): No ischemia or scar, not gated, low risk  ? Hx of echocardiogram   ? Echo (05/02/14): EF 60% to 65%. No regional wall motion abnormalities. Mild AI, mildly dilated aortic root (37 mm), trivial MR, trivial TR  ? Hyperlipidemia   ? Hypertension   ? Lipoma of skin   ? multiple  ? Osteopenia   ? PAF (paroxysmal atrial fibrillation) ([place])   ? failed DCCV 05/2014 >> Flecainide started >> DCCV 7/15 sucessful;  f/u ETT neg for pro-arrhythmia >> recurrent AF/AFL >> Flecainide inc to 100 bid with repeat DCCV 9/15  ? Vitamin D deficiency 09/25/2019  ? ? ?Past Surgical History:  ?Procedure Laterality Date  ? CARDIOVERSION N/A 06/05/2014  ? Procedure: CARDIOVERSION;  Surgeon: Sueanne Margarita, MD;  Location: [place];  Service: Cardiovascular;  Laterality: N/A;  ? CARDIOVERSION N/A 07/11/2014  ? Procedure: CARDIOVERSION;  Surgeon: Sueanne Margarita, MD;  Location: [place];  Service: Cardiovascular;  Laterality: N/A;  ? CARDIOVERSION N/A 09/04/2014  ? Procedure: CARDIOVERSION;  Surgeon: Sueanne Margarita, MD;  Location: [place];  Service: Cardiovascular;  Laterality: N/A;  ? COLONOSCOPY  2008  ? COLONOSCOPY N/A 05/20/2015  ? Procedure: COLONOSCOPY;  Surgeon: Ronald Lobo, MD;  Location: [facility];  Service: Endoscopy;  Laterality: N/A;  ? FEMUR IM NAIL Right 09/24/2019  ? Procedure: INTRAMEDULLARY (IM) NAIL FEMORAL;  Surgeon: Altamese Martinsburg, MD;  Location: [place];   Service: Orthopedics;  Laterality: Right;  ? IR KYPHO EA ADDL LEVEL THORACIC OR LUMBAR  04/12/2019  ? IR KYPHO THORACIC WITH BONE BIOPSY  04/12/2019  ? ROBOTIC ASSISTED SUPRACERVICAL HYSTERECTOMY WITH BILATERAL SALPINGO OOPH

## 2022-03-06 NOTE — ED Notes (Signed)
Report called to floor nurse Jodi Marble, RN. ?

## 2022-03-06 NOTE — ED Triage Notes (Signed)
Lives at assisted living, no BM for the past week. Been taking miralax the past 3 days without relief. Vomiting today. Having shortness of breath the past few weeks. ?

## 2022-03-07 DIAGNOSIS — R112 Nausea with vomiting, unspecified: Secondary | ICD-10-CM | POA: Diagnosis present

## 2022-03-07 DIAGNOSIS — K219 Gastro-esophageal reflux disease without esophagitis: Secondary | ICD-10-CM | POA: Diagnosis not present

## 2022-03-07 DIAGNOSIS — D72825 Bandemia: Secondary | ICD-10-CM

## 2022-03-07 DIAGNOSIS — N179 Acute kidney failure, unspecified: Secondary | ICD-10-CM | POA: Diagnosis not present

## 2022-03-07 DIAGNOSIS — I5033 Acute on chronic diastolic (congestive) heart failure: Secondary | ICD-10-CM | POA: Diagnosis not present

## 2022-03-07 DIAGNOSIS — E785 Hyperlipidemia, unspecified: Secondary | ICD-10-CM | POA: Diagnosis present

## 2022-03-07 DIAGNOSIS — D72829 Elevated white blood cell count, unspecified: Secondary | ICD-10-CM | POA: Diagnosis present

## 2022-03-07 DIAGNOSIS — Z862 Personal history of diseases of the blood and blood-forming organs and certain disorders involving the immune mechanism: Secondary | ICD-10-CM

## 2022-03-07 LAB — CBC WITH DIFFERENTIAL/PLATELET
Abs Immature Granulocytes: 0.09 10*3/uL — ABNORMAL HIGH (ref 0.00–0.07)
Abs Immature Granulocytes: 0.04 10*3/uL (ref 0.00–0.07)
Basophils Absolute: 0 10*3/uL (ref 0.0–0.1)
Basophils Absolute: 0 10*3/uL (ref 0.0–0.1)
Basophils Relative: 0 %
Basophils Relative: 0 %
Eosinophils Absolute: 0 10*3/uL (ref 0.0–0.5)
Eosinophils Absolute: 0 10*3/uL (ref 0.0–0.5)
Eosinophils Relative: 0 %
Eosinophils Relative: 0 %
HCT: 32.9 % — ABNORMAL LOW (ref 36.0–46.0)
HCT: 30.8 % — ABNORMAL LOW (ref 36.0–46.0)
Hemoglobin: 11 g/dL — ABNORMAL LOW (ref 12.0–15.0)
Hemoglobin: 9.9 g/dL — ABNORMAL LOW (ref 12.0–15.0)
Immature Granulocytes: 1 %
Immature Granulocytes: 1 %
Lymphocytes Relative: 1 %
Lymphocytes Relative: 3 %
Lymphs Abs: 0.2 10*3/uL — ABNORMAL LOW (ref 0.7–4.0)
Lymphs Abs: 0.2 10*3/uL — ABNORMAL LOW (ref 0.7–4.0)
MCH: 32.1 pg (ref 26.0–34.0)
MCH: 31.2 pg (ref 26.0–34.0)
MCHC: 33.4 g/dL (ref 30.0–36.0)
MCHC: 32.1 g/dL (ref 30.0–36.0)
MCV: 95.9 fL (ref 80.0–100.0)
MCV: 97.2 fL (ref 80.0–100.0)
Monocytes Absolute: 1 10*3/uL (ref 0.1–1.0)
Monocytes Absolute: 0.6 10*3/uL (ref 0.1–1.0)
Monocytes Relative: 8 %
Monocytes Relative: 7 %
Neutro Abs: 10.7 10*3/uL — ABNORMAL HIGH (ref 1.7–7.7)
Neutro Abs: 7.4 10*3/uL (ref 1.7–7.7)
Neutrophils Relative %: 90 %
Neutrophils Relative %: 89 %
Platelets: 147 10*3/uL — ABNORMAL LOW (ref 150–400)
Platelets: 113 10*3/uL — ABNORMAL LOW (ref 150–400)
RBC: 3.43 MIL/uL — ABNORMAL LOW (ref 3.87–5.11)
RBC: 3.17 MIL/uL — ABNORMAL LOW (ref 3.87–5.11)
RDW: 17.3 % — ABNORMAL HIGH (ref 11.5–15.5)
RDW: 17 % — ABNORMAL HIGH (ref 11.5–15.5)
WBC: 11.9 10*3/uL — ABNORMAL HIGH (ref 4.0–10.5)
WBC: 8.3 10*3/uL (ref 4.0–10.5)
nRBC: 0 % (ref 0.0–0.2)
nRBC: 0 % (ref 0.0–0.2)

## 2022-03-07 LAB — COMPREHENSIVE METABOLIC PANEL
ALT: 22 U/L (ref 0–44)
AST: 33 U/L (ref 15–41)
Albumin: 2.7 g/dL — ABNORMAL LOW (ref 3.5–5.0)
Alkaline Phosphatase: 73 U/L (ref 38–126)
Anion gap: 11 (ref 5–15)
BUN: 26 mg/dL — ABNORMAL HIGH (ref 8–23)
CO2: 28 mmol/L (ref 22–32)
Calcium: 9.1 mg/dL (ref 8.9–10.3)
Chloride: 105 mmol/L (ref 98–111)
Creatinine, Ser: 2.2 mg/dL — ABNORMAL HIGH (ref 0.44–1.00)
GFR, Estimated: 21 mL/min — ABNORMAL LOW (ref >60)
Glucose, Bld: 89 mg/dL (ref 70–99)
Potassium: 3.5 mmol/L (ref 3.5–5.1)
Sodium: 144 mmol/L (ref 135–145)
Total Bilirubin: 1.1 mg/dL (ref 0.3–1.2)
Total Protein: 5.1 g/dL — ABNORMAL LOW (ref 6.5–8.1)

## 2022-03-07 LAB — MAGNESIUM: Magnesium: 1.8 mg/dL (ref 1.7–2.4)

## 2022-03-07 LAB — PROCALCITONIN: Procalcitonin: 14.38 ng/mL

## 2022-03-07 LAB — PROTIME-INR
INR: 1.5 — ABNORMAL HIGH (ref 0.8–1.2)
Prothrombin Time: 17.7 seconds — ABNORMAL HIGH (ref 11.4–15.2)

## 2022-03-07 LAB — OCCULT BLOOD X 1 CARD TO LAB, STOOL: Fecal Occult Bld: NEGATIVE

## 2022-03-07 MED ORDER — SODIUM CHLORIDE 0.9 % IV SOLN
1.0000 g | INTRAVENOUS | Status: AC
  Administered 2022-03-07 – 2022-03-09 (×3): 1 g via INTRAVENOUS
  Filled 2022-03-07 (×3): qty 10

## 2022-03-07 NOTE — Progress Notes (Signed)
?PROGRESS NOTE ? ? ? ?[name]  UTM:[id number] DOB: October 24, 1933 DOA: 03/06/2022 ?PCP: [facility] ? ? ?Brief Narrative:  ?Christina Morgan is a 88 y.o. female with medical history significant for chronic diastolic heart failure, chronic right ventricular systolic heart failure, paroxysmal atrial fibrillation chronically anticoagulated on Eliquis, hypertension, hyperlipidemia, stage IIIB chronic kidney disease, chronic anemia with baseline hemoglobin 9-11, who is admitted to [facility] on 03/06/2022 with acute on chronic diastolic heart failure after presenting from home to to the latter facility complaining of shortness of breath orthopnea and lower extremity edema. ? ?Assessment & Plan: ?  ?Active Problems: ?  Acute on chronic diastolic CHF (congestive heart failure) ([place]) ?  PAF (paroxysmal atrial fibrillation) ([place]) ?  Acute renal failure superimposed on stage 3b chronic kidney disease ([place]) ?  Leukocytosis ?  Nausea & vomiting ?  Dyslipidemia ?  History of anemia due to chronic kidney disease ?  GERD (gastroesophageal reflux disease) ? ?Acute HF exacerbation -  ?Continue diuresis, furosemide 40 IV twice daily ?Follow I's and O's, diuresing well ?Without hypoxia ? ?AKI on CKD3B ?In the setting of heart failure exacerbation and diuretics ?Continue to monitor closely ? ?Acute metabolic encephalopathy, POA  ?Rule out sundowning versus infectious process ?Patient alert and oriented x3 but continues to ask inappropriate questions such as "when he will be going to finish painting the bedroom" and points at the hospital wall ?-Daughter at bedside confirms this is not her baseline, notes she had previous encephalopathy with UTI, will follow for infection as below ? ?Reactive leukocytosis, resolved ?Rule out acute infection ?Rule out UTI ?-Leukocytosis elevated at intake, within normal limits today ?-Procalcitonin elevated (14), although no overt complaints from patient to indicate any  ongoing infectious process ?-Confusion as above concerning for UTI per daughter -UA abnormal, will follow urine culture -add ceftriaxone x3 days to cover any potential brewing UTI given symptoms ? ?Paroxysmal afib -rate controlled  ?-Continue amiodarone, Eliquis 2.5, hydralazine ? ?HLD continue atorvastatin 10 ?GERD-continue Protonix 40 ?Chronic anemia of chronic disease (CKD3B) -follow repeat labs, currently at baseline ? ?DVT prophylaxis: eliquis ?Code Status: Full ?Family Communication: Daughter at bedside ? ?Status is: Inpatient ? ?Dispo: The patient is from: Assisted living ?             Anticipated d/c is to: Assisted living ?             Anticipated d/c date is: 48 to 72 hours ?             Patient currently not medically stable for discharge ? ?Consultants:  ?None ? ?Procedures:  ?None ? ?Antimicrobials:  ?Ceftriaxone x3 days ? ?Subjective: ?No acute issues or events overnight, patient somewhat confused this morning, ANO x4 but appears to be disoriented on occasion questionable hallucinations.  Patient denies overt nausea vomiting diarrhea constipation headache fevers chills chest pain urinary symptoms. ? ?Objective: ?Vitals:  ? 03/06/22 2000 03/06/22 2158 03/07/22 0000 03/07/22 0536  ?BP: 129/60 (!) 137/57 (!) 143/70 (!) 130/53  ?Pulse: 65 68    ?Resp: 12 15    ?Temp:   98.5 ?F (36.9 ?C)   ?TempSrc:   Axillary   ?SpO2: 94% 94%    ?Weight:      ?Height:      ? ? ?Intake/Output Summary (Last 24 hours) at 03/07/2022 0737 ?Last data filed at 03/07/2022 0500 ?Gross per 24 hour  ?Intake 120 ml  ?Output 1000 ml  ?Net -880 ml  ? ?Filed  Weights  ? 03/06/22 1548  ?Weight: 74.8 kg  ? ? ?Examination: ? ?General:  Pleasantly resting in bed, No acute distress. ?HEENT:  Normocephalic atraumatic.  Sclerae nonicteric, noninjected.  Extraocular movements intact bilaterally. ?Neck:  Without mass or deformity.  Trachea is midline. ?Lungs: Scant bibasilar rales, diminished breath sounds bilaterally. ?Heart: Irregularly  irregular. ?Abdomen:  Soft, nontender, nondistended.  Without guarding or rebound. ?Extremities: Bilateral lower extremity 1+ pitting edema, tightly wrapped with compression bandages, bandage clean dry intact ? ?Data Reviewed: I have personally reviewed following labs and imaging studies ? ?CBC: ?Recent Labs  ?Lab 03/06/22 ?1621 03/07/22 ?0127  ?WBC 11.9* 8.3  ?NEUTROABS 10.7* 7.4  ?HGB 11.0* 9.9*  ?HCT 32.9* 30.8*  ?MCV 95.9 97.2  ?PLT 147* 113*  ? ?Basic Metabolic Panel: ?Recent Labs  ?Lab 03/06/22 ?1621 03/07/22 ?0127  ?NA 142 144  ?K 4.0 3.5  ?CL 106 105  ?CO2 26 28  ?GLUCOSE 108* 89  ?BUN 29* 26*  ?CREATININE 2.12* 2.20*  ?CALCIUM 9.1 9.1  ?MG  --  1.8  ? ?GFR: ?Estimated Creatinine Clearance: 17.8 mL/min (A) (by C-G formula based on SCr of 2.2 mg/dL (H)). ?Liver Function Tests: ?Recent Labs  ?Lab 03/06/22 ?1621 03/07/22 ?0127  ?AST 47* 33  ?ALT 24 22  ?ALKPHOS 85 73  ?BILITOT 1.0 1.1  ?PROT 6.3* 5.1*  ?ALBUMIN 3.2* 2.7*  ? ?Recent Labs  ?Lab 03/06/22 ?1621  ?LIPASE 60*  ? ?No results for input(s): AMMONIA in the last 168 hours. ?Coagulation Profile: ?Recent Labs  ?Lab 03/07/22 ?0127  ?INR 1.5*  ? ?Cardiac Enzymes: ?No results for input(s): CKTOTAL, CKMB, CKMBINDEX, TROPONINI in the last 168 hours. ?BNP (last 3 results) ?Recent Labs  ?  02/18/22 ?1356  ?PROBNP 264  ? ?HbA1C: ?No results for input(s): HGBA1C in the last 72 hours. ?CBG: ?No results for input(s): GLUCAP in the last 168 hours. ?Lipid Profile: ?No results for input(s): CHOL, HDL, LDLCALC, TRIG, CHOLHDL, LDLDIRECT in the last 72 hours. ?Thyroid Function Tests: ?No results for input(s): TSH, T4TOTAL, FREET4, T3FREE, THYROIDAB in the last 72 hours. ?Anemia Panel: ?No results for input(s): VITAMINB12, FOLATE, FERRITIN, TIBC, IRON, RETICCTPCT in the last 72 hours. ?Sepsis Labs: ?Recent Labs  ?Lab 03/07/22 ?0127  ?PROCALCITON 14.38  ? ? ?No results found for this or any previous visit (from the past 240 hour(s)).  ? ? ? ? ? ?Radiology Studies: ?CT ABDOMEN  PELVIS WO CONTRAST ? ?Result Date: 03/06/2022 ?CLINICAL DATA:  Abdominal pain, vomiting, shortness of breath EXAM: CT ABDOMEN AND PELVIS WITHOUT CONTRAST TECHNIQUE: Multidetector CT imaging of the abdomen and pelvis was performed following the standard protocol without IV contrast. Unenhanced CT was performed per clinician order. Lack of IV contrast limits sensitivity and specificity, especially for evaluation of abdominal/pelvic solid viscera. RADIATION DOSE REDUCTION: This exam was performed according to the departmental dose-optimization program which includes automated exposure control, adjustment of the mA and/or kV according to patient size and/or use of iterative reconstruction technique. COMPARISON:  06/19/2014 FINDINGS: Lower chest: Small bilateral pleural effusions, left greater than right. Compressive atelectasis within the bilateral lower lobes. Mild cardiomegaly without pericardial effusion. Hepatobiliary: High attenuation material within the gallbladder compatible with sludge. No evidence of calcified gallstones or cholecystitis. Unremarkable unenhanced appearance of the liver. Pancreas: Unremarkable. No pancreatic ductal dilatation or surrounding inflammatory changes. Spleen: Unremarkable unenhanced appearance. Adrenals/Urinary Tract: No urinary tract calculi or obstructive uropathy within either kidney. Unremarkable unenhanced appearance of the adrenals, kidneys, and bladder. Stomach/Bowel: No bowel obstruction or  ileus. Minimal retained stool within the rectal vault. There is diffuse diverticulosis of the sigmoid colon, with no evidence of acute diverticulitis. Normal appendix right lower quadrant. No bowel wall thickening or inflammatory change. Vascular/Lymphatic: Aortic atherosclerosis. No enlarged abdominal or pelvic lymph nodes. Reproductive: Status post hysterectomy. No adnexal masses. Other: No free intraperitoneal fluid or free gas. No abdominal wall hernia. Musculoskeletal: Postsurgical  changes are seen from ORIF of a subacute intertrochanteric right hip fracture. Fracture line is not well visualized, with moderate callus formation suggesting significant healing. Overlying subcutaneous fat stranding

## 2022-03-07 NOTE — Assessment & Plan Note (Addendum)
--   Stable.  Continue amiodarone, apixaban  ?

## 2022-03-07 NOTE — Assessment & Plan Note (Deleted)
?  ?#)   Leukocytosis: Mildly elevated blood cell count of 11,900, without additional SIRS criteria.  Without overt evidence of underlying infectious process.  Overall, treated for sepsis not currently met.  In the setting of the patient's presenting shortness of breath, new onset nonproductive cough, bilateral interstitial opacities in the aforementioned leukocytosis, will further evaluate for bacterial pneumonia by checking procalcitonin.  If the patient had as a positive COVID-19 finding from 2 months ago, repeat COVID-19 PCR has not been checked.  CT abdomen/pelvis showed no evidence of acute intra-abdominal/intrapelvic process, including no evidence of acute cholecystitis.  Of note, 11-20 white blood cells on urinalysis does not meet quantitative threshold for significance in a female.  In tandem with no report of acute urinary symptoms, will not consider this finding to represent a UTI.  Noninfectious differential for her presenting leukocytosis, includes inflammatory response to recent nausea/vomiting. ?  ?Plan: Add on procalcitonin.  Repeat CMP and CBC with differential in the morning.  Monitor strict I's and O's and daily weights. ?  ?

## 2022-03-07 NOTE — Assessment & Plan Note (Addendum)
--   Normal LVEF by echocardiogram January 2023, at which time diastolic function could not be characterized ?-- Appears euvolemic. Lasix stopped 3/29 afternoon given increasing creatinine. Encourage fluid intake. Oral intake poor. ?-- Continue atorvastatin, does not appear she is on any beta-blocker or calcium channel blocker  ?

## 2022-03-07 NOTE — Discharge Instructions (Signed)

## 2022-03-07 NOTE — Assessment & Plan Note (Deleted)
?  ?  ?#)   Anemia of chronic kidney disease: Associated baseline hemoglobin range of 9-11, present hemoglobin consistent with this range, in the absence of any evidence of active bleed. ?  ?Plan: Repeat CBC in the morning. ?  ?

## 2022-03-07 NOTE — Assessment & Plan Note (Deleted)
?  ?#)   Nausea/vomiting: Intermittent nausea resulting in nonbloody, nonbilious emesis over the last few days, with most recent such episode occurring earlier today.  CT abdomen/pelvis showed no evidence of acute process, including no evidence of acute cholecystitis nor any evidence of acute pancreatitis.  Differential includes potential contribution from congestive gastropathy particularly in the setting of acute on chronic right ventricular systolic heart failure, which correlates with aforementioned AST elevation.  No evidence of ACS. ?  ?Plan: Further evaluation management acute on chronic heart failure, as above including aforementioned IV diuresis efforts.  Prn Zofran.  Add on serum magnesium level.  Repeat CMP in the morning. ?  ?  ?  ?  ?

## 2022-03-07 NOTE — Assessment & Plan Note (Addendum)
statin

## 2022-03-07 NOTE — Assessment & Plan Note (Addendum)
--   Slightly worse today, in the setting of diuresis stopped yesterday afternoon. Poor intake. Encourage fluids. BMP in AM. ?

## 2022-03-07 NOTE — Assessment & Plan Note (Addendum)
continue PPI

## 2022-03-07 NOTE — Consult Note (Addendum)
WOC Nurse Consult Note: ?Reason for Consult: Consult requested for bilat legs.  Pt has very thin fragile skin with multiple bruises to left anterior calf. There is a an area of full thickness abrasion; .2X.2X.2cm, dark red, no drainage or fluctuance. Generalized edema to bilat legs. ?Left heel with dark purple deep tissue pressure injury; .2X.2cm ?Right leg and heel with intact skin, no bruises or open areas.  ?Pressure Injury POA: Yes ?Dressing procedure/placement/frequency: Topical treatment orders provided for bedside nurses to reduce edema and protect from further injuries: Float heels to reduce pressure. Foam dressing to bilat heels.  ?Change wraps to bilat legs Q M/W/F as follows: foam dressing to left calf.  Apply kerlex in a spiral fashion, beginning just behind toes, to below knees, then ace wrap in the same manner.  ?Please re-consult if further assistance is needed.  Thank-you,  ?Julien Girt MSN, RN, [place], [place], CNS ?[phone]  ?  ?

## 2022-03-08 DIAGNOSIS — N179 Acute kidney failure, unspecified: Secondary | ICD-10-CM | POA: Diagnosis not present

## 2022-03-08 DIAGNOSIS — I5033 Acute on chronic diastolic (congestive) heart failure: Secondary | ICD-10-CM | POA: Diagnosis not present

## 2022-03-08 DIAGNOSIS — K219 Gastro-esophageal reflux disease without esophagitis: Secondary | ICD-10-CM | POA: Diagnosis not present

## 2022-03-08 DIAGNOSIS — E785 Hyperlipidemia, unspecified: Secondary | ICD-10-CM | POA: Diagnosis not present

## 2022-03-08 LAB — CBC
HCT: 32.1 % — ABNORMAL LOW (ref 36.0–46.0)
Hemoglobin: 10.7 g/dL — ABNORMAL LOW (ref 12.0–15.0)
MCH: 31.8 pg (ref 26.0–34.0)
MCHC: 33.3 g/dL (ref 30.0–36.0)
MCV: 95.3 fL (ref 80.0–100.0)
Platelets: 120 10*3/uL — ABNORMAL LOW (ref 150–400)
RBC: 3.37 MIL/uL — ABNORMAL LOW (ref 3.87–5.11)
RDW: 16.9 % — ABNORMAL HIGH (ref 11.5–15.5)
WBC: 5.8 10*3/uL (ref 4.0–10.5)
nRBC: 0 % (ref 0.0–0.2)

## 2022-03-08 LAB — COMPREHENSIVE METABOLIC PANEL
ALT: 30 U/L (ref 0–44)
AST: 49 U/L — ABNORMAL HIGH (ref 15–41)
Albumin: 2.8 g/dL — ABNORMAL LOW (ref 3.5–5.0)
Alkaline Phosphatase: 80 U/L (ref 38–126)
Anion gap: 12 (ref 5–15)
BUN: 23 mg/dL (ref 8–23)
CO2: 31 mmol/L (ref 22–32)
Calcium: 9.1 mg/dL (ref 8.9–10.3)
Chloride: 102 mmol/L (ref 98–111)
Creatinine, Ser: 2.34 mg/dL — ABNORMAL HIGH (ref 0.44–1.00)
GFR, Estimated: 20 mL/min — ABNORMAL LOW (ref >60)
Glucose, Bld: 87 mg/dL (ref 70–99)
Potassium: 3.4 mmol/L — ABNORMAL LOW (ref 3.5–5.1)
Sodium: 145 mmol/L (ref 135–145)
Total Bilirubin: 0.9 mg/dL (ref 0.3–1.2)
Total Protein: 5.5 g/dL — ABNORMAL LOW (ref 6.5–8.1)

## 2022-03-08 MED ORDER — DOCUSATE SODIUM 100 MG PO CAPS
100.0000 mg | ORAL_CAPSULE | Freq: Every day | ORAL | Status: DC
  Administered 2022-03-09 – 2022-03-11 (×3): 100 mg via ORAL
  Filled 2022-03-08 (×3): qty 1

## 2022-03-08 MED ORDER — POLYETHYLENE GLYCOL 3350 17 G PO PACK: 17.0000 g | PACK | Freq: Every day | ORAL | Status: DC | PRN

## 2022-03-08 NOTE — Progress Notes (Signed)
?PROGRESS NOTE ? ? ? ?Christina Morgan  WVP:[id number] DOB: 08-19-1933 DOA: 03/06/2022 ?PCP: [facility] ? ? ?Brief Narrative:  ?Christina Morgan is a 88 y.o. female with medical history significant for chronic diastolic heart failure, chronic right ventricular systolic heart failure, paroxysmal atrial fibrillation chronically anticoagulated on Eliquis, hypertension, hyperlipidemia, stage IIIB chronic kidney disease, chronic anemia with baseline hemoglobin 9-11, who is admitted to [facility] on 03/06/2022 with acute on chronic diastolic heart failure after presenting from home to to the latter facility complaining of shortness of breath orthopnea and lower extremity edema. ? ?Assessment & Plan: ?  ?Active Problems: ?  Acute on chronic diastolic CHF (congestive heart failure) ([place]) ?  PAF (paroxysmal atrial fibrillation) ([place]) ?  Acute renal failure superimposed on stage 3b chronic kidney disease ([place]) ?  Leukocytosis ?  Nausea & vomiting ?  Dyslipidemia ?  History of anemia due to chronic kidney disease ?  GERD (gastroesophageal reflux disease) ? ?Acute HF exacerbation - resolving ?Continue diuresis, furosemide 40 IV twice daily ?Follow I's and O's, diuresing well - negative 3L ?Without hypoxia ? ?Acute metabolic encephalopathy, POA  ?Rule out sundowning versus infectious process ?Patient alert and oriented x3 but continues to ask inappropriate questions ?-Daughter at bedside confirms this is not her baseline, notes she had previous encephalopathy with UTI, will follow for infection as below ? ?Presumed UTI, does not meet sepsis criteria. POA ?-Leukocytosis resolved ?-Procalcitonin elevated (14), although no overt complaints from patient to indicate any ongoing infectious process ?-Confusion as above concerning for UTI per daughter -UA abnormal, cultures pending ? ?AKI on CKD3B ?In the setting of heart failure exacerbation and diuretics ?Continue to monitor closely ? ?Paroxysmal afib - rate  controlled  ?-Continue amiodarone, Eliquis 2.5, hydralazine ? ?HLD continue atorvastatin 10 ?GERD-continue Protonix 40 ?Chronic anemia of chronic disease (CKD3B) -follow repeat labs, currently at baseline ? ?DVT prophylaxis: eliquis ?Code Status: Full ?Family Communication: Daughter at bedside ? ?Status is: Inpatient ? ?Dispo: The patient is from: Assisted living ?             Anticipated d/c is to: Assisted living ?             Anticipated d/c date is: 48 to 72 hours ?             Patient currently not medically stable for discharge ? ?Consultants:  ?None ? ?Procedures:  ?None ? ?Antimicrobials:  ?Ceftriaxone x3 days ? ?Subjective: ?No acute issues or events overnight, patient remains confused this morning, indicates we are at home, her house and her furniture is missing.  Reoriented to hospital situation and current events. ? ?Objective: ?Vitals:  ? 03/07/22 2324 03/08/22 5462 03/08/22 0411 03/08/22 7035  ?BP: (!) 133/54 (!) 138/56  (!) 131/53  ?Pulse: 74 78  74  ?Resp: '13 13  13  '$ ?Temp: (!) 97.5 ?F (36.4 ?C) 97.6 ?F (36.4 ?C)  97.6 ?F (36.4 ?C)  ?TempSrc: Oral Oral  Oral  ?SpO2: 96% 97%  96%  ?Weight:   74.2 kg   ?Height:      ? ? ?Intake/Output Summary (Last 24 hours) at 03/08/2022 0753 ?Last data filed at 03/08/2022 0093 ?Gross per 24 hour  ?Intake 240 ml  ?Output 1900 ml  ?Net -1660 ml  ? ? ?Filed Weights  ? 03/06/22 1548 03/08/22 0411  ?Weight: 74.8 kg 74.2 kg  ? ? ?Examination: ? ?General:  Pleasantly resting in bed, No acute distress.  Alert to person and general  situation only ?HEENT:  Normocephalic atraumatic.  Sclerae nonicteric, noninjected.  Extraocular movements intact bilaterally. ?Neck:  Without mass or deformity.  Trachea is midline. ?Lungs: Scant bibasilar rales, diminished breath sounds bilaterally. ?Heart: Irregularly irregular. ?Abdomen:  Soft, nontender, nondistended.  Without guarding or rebound. ?Extremities: Bilateral lower extremity 1+ pitting edema, tightly wrapped with compression  bandages, bandage clean dry intact ? ?Data Reviewed: I have personally reviewed following labs and imaging studies ? ?CBC: ?Recent Labs  ?Lab 03/06/22 ?1621 03/07/22 ?0127 03/08/22 ?0205  ?WBC 11.9* 8.3 5.8  ?NEUTROABS 10.7* 7.4  --   ?HGB 11.0* 9.9* 10.7*  ?HCT 32.9* 30.8* 32.1*  ?MCV 95.9 97.2 95.3  ?PLT 147* 113* 120*  ? ? ?Basic Metabolic Panel: ?Recent Labs  ?Lab 03/06/22 ?1621 03/07/22 ?0127 03/08/22 ?0205  ?NA 142 144 145  ?K 4.0 3.5 3.4*  ?CL 106 105 102  ?CO2 '26 28 31  '$ ?GLUCOSE 108* 89 87  ?BUN 29* 26* 23  ?CREATININE 2.12* 2.20* 2.34*  ?CALCIUM 9.1 9.1 9.1  ?MG  --  1.8  --   ? ? ?GFR: ?Estimated Creatinine Clearance: 16.6 mL/min (A) (by C-G formula based on SCr of 2.34 mg/dL (H)). ?Liver Function Tests: ?Recent Labs  ?Lab 03/06/22 ?1621 03/07/22 ?0127 03/08/22 ?0205  ?AST 47* 33 49*  ?ALT '24 22 30  '$ ?ALKPHOS 85 73 80  ?BILITOT 1.0 1.1 0.9  ?PROT 6.3* 5.1* 5.5*  ?ALBUMIN 3.2* 2.7* 2.8*  ? ? ?Recent Labs  ?Lab 03/06/22 ?1621  ?LIPASE 60*  ? ? ?No results for input(s): AMMONIA in the last 168 hours. ?Coagulation Profile: ?Recent Labs  ?Lab 03/07/22 ?0127  ?INR 1.5*  ? ? ?Cardiac Enzymes: ?No results for input(s): CKTOTAL, CKMB, CKMBINDEX, TROPONINI in the last 168 hours. ?BNP (last 3 results) ?Recent Labs  ?  02/18/22 ?1356  ?PROBNP 264  ? ? ?HbA1C: ?No results for input(s): HGBA1C in the last 72 hours. ?CBG: ?No results for input(s): GLUCAP in the last 168 hours. ?Lipid Profile: ?No results for input(s): CHOL, HDL, LDLCALC, TRIG, CHOLHDL, LDLDIRECT in the last 72 hours. ?Thyroid Function Tests: ?No results for input(s): TSH, T4TOTAL, FREET4, T3FREE, THYROIDAB in the last 72 hours. ?Anemia Panel: ?No results for input(s): VITAMINB12, FOLATE, FERRITIN, TIBC, IRON, RETICCTPCT in the last 72 hours. ?Sepsis Labs: ?Recent Labs  ?Lab 03/07/22 ?0127  ?PROCALCITON 14.38  ? ? ? ?No results found for this or any previous visit (from the past 240 hour(s)).  ? ? ? ? ? ?Radiology Studies: ?CT ABDOMEN PELVIS WO  CONTRAST ? ?Result Date: 03/06/2022 ?CLINICAL DATA:  Abdominal pain, vomiting, shortness of breath EXAM: CT ABDOMEN AND PELVIS WITHOUT CONTRAST TECHNIQUE: Multidetector CT imaging of the abdomen and pelvis was performed following the standard protocol without IV contrast. Unenhanced CT was performed per clinician order. Lack of IV contrast limits sensitivity and specificity, especially for evaluation of abdominal/pelvic solid viscera. RADIATION DOSE REDUCTION: This exam was performed according to the departmental dose-optimization program which includes automated exposure control, adjustment of the mA and/or kV according to patient size and/or use of iterative reconstruction technique. COMPARISON:  06/19/2014 FINDINGS: Lower chest: Small bilateral pleural effusions, left greater than right. Compressive atelectasis within the bilateral lower lobes. Mild cardiomegaly without pericardial effusion. Hepatobiliary: High attenuation material within the gallbladder compatible with sludge. No evidence of calcified gallstones or cholecystitis. Unremarkable unenhanced appearance of the liver. Pancreas: Unremarkable. No pancreatic ductal dilatation or surrounding inflammatory changes. Spleen: Unremarkable unenhanced appearance. Adrenals/Urinary Tract: No urinary tract calculi or obstructive uropathy  within either kidney. Unremarkable unenhanced appearance of the adrenals, kidneys, and bladder. Stomach/Bowel: No bowel obstruction or ileus. Minimal retained stool within the rectal vault. There is diffuse diverticulosis of the sigmoid colon, with no evidence of acute diverticulitis. Normal appendix right lower quadrant. No bowel wall thickening or inflammatory change. Vascular/Lymphatic: Aortic atherosclerosis. No enlarged abdominal or pelvic lymph nodes. Reproductive: Status post hysterectomy. No adnexal masses. Other: No free intraperitoneal fluid or free gas. No abdominal wall hernia. Musculoskeletal: Postsurgical changes are  seen from ORIF of a subacute intertrochanteric right hip fracture. Fracture line is not well visualized, with moderate callus formation suggesting significant healing. Overlying subcutaneous fat stranding and skin thicke

## 2022-03-09 DIAGNOSIS — G9341 Metabolic encephalopathy: Secondary | ICD-10-CM | POA: Diagnosis not present

## 2022-03-09 DIAGNOSIS — N390 Urinary tract infection, site not specified: Secondary | ICD-10-CM | POA: Diagnosis not present

## 2022-03-09 DIAGNOSIS — R7401 Elevation of levels of liver transaminase levels: Secondary | ICD-10-CM

## 2022-03-09 DIAGNOSIS — I5033 Acute on chronic diastolic (congestive) heart failure: Secondary | ICD-10-CM | POA: Diagnosis not present

## 2022-03-09 DIAGNOSIS — N179 Acute kidney failure, unspecified: Secondary | ICD-10-CM | POA: Diagnosis not present

## 2022-03-09 DIAGNOSIS — D696 Thrombocytopenia, unspecified: Secondary | ICD-10-CM

## 2022-03-09 LAB — CBC
HCT: 34.5 % — ABNORMAL LOW (ref 36.0–46.0)
Hemoglobin: 11.5 g/dL — ABNORMAL LOW (ref 12.0–15.0)
MCH: 31.9 pg (ref 26.0–34.0)
MCHC: 33.3 g/dL (ref 30.0–36.0)
MCV: 95.6 fL (ref 80.0–100.0)
Platelets: 125 10*3/uL — ABNORMAL LOW (ref 150–400)
RBC: 3.61 MIL/uL — ABNORMAL LOW (ref 3.87–5.11)
RDW: 16.9 % — ABNORMAL HIGH (ref 11.5–15.5)
WBC: 4.2 10*3/uL (ref 4.0–10.5)
nRBC: 0 % (ref 0.0–0.2)

## 2022-03-09 LAB — COMPREHENSIVE METABOLIC PANEL
ALT: 36 U/L (ref 0–44)
AST: 58 U/L — ABNORMAL HIGH (ref 15–41)
Albumin: 3 g/dL — ABNORMAL LOW (ref 3.5–5.0)
Alkaline Phosphatase: 87 U/L (ref 38–126)
Anion gap: 15 (ref 5–15)
BUN: 22 mg/dL (ref 8–23)
CO2: 30 mmol/L (ref 22–32)
Calcium: 9.1 mg/dL (ref 8.9–10.3)
Chloride: 99 mmol/L (ref 98–111)
Creatinine, Ser: 2.43 mg/dL — ABNORMAL HIGH (ref 0.44–1.00)
GFR, Estimated: 19 mL/min — ABNORMAL LOW (ref >60)
Glucose, Bld: 73 mg/dL (ref 70–99)
Potassium: 3.6 mmol/L (ref 3.5–5.1)
Sodium: 144 mmol/L (ref 135–145)
Total Bilirubin: 0.9 mg/dL (ref 0.3–1.2)
Total Protein: 5.8 g/dL — ABNORMAL LOW (ref 6.5–8.1)

## 2022-03-09 NOTE — Evaluation (Signed)
Occupational Therapy Evaluation ?Patient Details ?Name: Christina Morgan ?MRN: [id number] ?DOB: [birth year] ?Today's Date: 03/09/2022 ? ? ?History of Present Illness Patient is an 88 yo female presenting to the ED on 03/06/22 with SOB and BLE edema. Admitted with acute on chronic diastolic heart failure. PMH includes: chronic diastolic heart failure, chronic right ventricular systolic heart failure, paroxysmal atrial fibrillation chronically anticoagulated on Eliquis, hypertension, hyperlipidemia, stage IIIB chronic kidney disease,  and chronic anemia with baseline hemoglobin 9-11  ? ?Clinical Impression ?  ?Prior to this admission, patient was living at an assisted living facility where she received assistance with all ADLs. Patient could transfer to a w/c, take a few steps, as well as transfer on and off a shower seat with assistance from staff. Currently, patient demonstrating minimal confusion, and needing max A x2 in order to attempt transfers. Patient requiring multi-modal cues to motor plan sitting EOB, unable to move BLEs without external assist from OT, max assist provided at trunk and hip to come into sitting, able to hold balance to complete grooming tasks with min A for throughness, however requiring max A in order to return to bed at trunk and BLEs. Patient is also currently requiring max-total A for ADLs. Patient declining transfer attempts, and requiring increased encouragement to participate in evaluation to date. OT recommending short term stint at SNF level rehab at ALF prior to returning to ALF. OT will continue to follow acutely in order to address functional deficits outlined below.  ?   ? ?Recommendations for follow up therapy are one component of a multi-disciplinary discharge planning process, led by the attending physician.  Recommendations may be updated based on patient status, additional functional criteria and insurance authorization.  ? ?Follow Up Recommendations ? Skilled nursing-short  term rehab (<3 hours/day) (At ALF facility)  ?  ?Assistance Recommended at Discharge Frequent or constant Supervision/Assistance  ?Patient can return home with the following Two people to help with walking and/or transfers;Two people to help with bathing/dressing/bathroom;Assistance with cooking/housework;Direct supervision/assist for medications management;Assist for transportation;Direct supervision/assist for financial management;Help with stairs or ramp for entrance ? ?  ?Functional Status Assessment ? Patient has had a recent decline in their functional status and demonstrates the ability to make significant improvements in function in a reasonable and predictable amount of time.  ?Equipment Recommendations ? Other (comment) (Defer to next venue)  ?  ?Recommendations for Other Services   ? ? ?  ?Precautions / Restrictions Precautions ?Precautions: Fall ?Restrictions ?Weight Bearing Restrictions: No  ? ?  ? ?Mobility Bed Mobility ?Overal bed mobility: Needs Assistance ?Bed Mobility: Sit to Supine, Supine to Sit ?  ?  ?Supine to sit: Max assist ?Sit to supine: Max assist ?  ?General bed mobility comments: Patient requiring multi-modal cues to motor plan sitting EOB, unable to move BLEs without external assist from OT, max assist provided at trunk and hip to come into sitting, able to hold balance to complete grooming tasks with min A for throughness, however requiring max A in order to return to bed at trunk and BLEs ?  ? ?Transfers ?  ?  ?  ?  ?  ?  ?  ?  ?  ?General transfer comment: Patient declining, needing max encourgagement to sit EOB, per patient can typically take a few steps but transfers to a w/c at baseline ?  ? ?  ?Balance Overall balance assessment: Needs assistance ?Sitting-balance support: Bilateral upper extremity supported, Single extremity supported, Feet supported ?Sitting balance-Leahy Scale: Poor ?  ?  Postural control: Right lateral lean ?  ?  ?  ?  ?  ?  ?  ?  ?  ?  ?  ?  ?  ?  ?   ? ?ADL  either performed or assessed with clinical judgement  ? ?ADL Overall ADL's : Needs assistance/impaired ?Eating/Feeding: Bed level;Minimal assistance ?  ?Grooming: Sitting;Wash/dry hands;Wash/dry face;Minimal assistance ?  ?Upper Body Bathing: Minimal assistance;Sitting ?  ?Lower Body Bathing: Maximal assistance;Sitting/lateral leans ?  ?Upper Body Dressing : Minimal assistance;Sitting ?  ?Lower Body Dressing: Total assistance;Maximal assistance;Sitting/lateral leans ?  ?Toilet Transfer: +2 for physical assistance;+2 for safety/equipment;Maximal assistance;Stand-pivot ?  ?Toileting- Clothing Manipulation and Hygiene: Maximal assistance;+2 for physical assistance;+2 for safety/equipment;Total assistance;Bed level ?  ?  ?  ?Functional mobility during ADLs: Maximal assistance;+2 for physical assistance;+2 for safety/equipment;Cueing for safety;Cueing for sequencing ?General ADL Comments: Patient presenting with decreased activity tolerance, confusion, and need for increased assist for all ADLs  ? ? ? ?Vision Baseline Vision/History: 1 Wears glasses ?Ability to See in Adequate Light: 0 Adequate ?Patient Visual Report: Blurring of vision ?Additional Comments: Stating her vision was blurry in her L eye, but after further questioning this has been occurring over the last month, will continue to assess in functional context  ?   ?Perception   ?  ?Praxis   ?  ? ?Pertinent Vitals/Pain Pain Assessment ?Pain Assessment: Faces ?Faces Pain Scale: Hurts a little bit ?Pain Location: Bilateral feet ?Pain Descriptors / Indicators: Aching, Discomfort, Grimacing ?Pain Intervention(s): Limited activity within patient's tolerance, Monitored during session, Repositioned  ? ? ? ?Hand Dominance   ?  ?Extremity/Trunk Assessment Upper Extremity Assessment ?Upper Extremity Assessment: Generalized weakness ?  ?Lower Extremity Assessment ?Lower Extremity Assessment: Defer to PT evaluation ?  ?Cervical / Trunk Assessment ?Cervical / Trunk  Assessment: Kyphotic ?  ?Communication Communication ?Communication: No difficulties ?  ?Cognition Arousal/Alertness: Lethargic ?Behavior During Therapy: Flat affect ?Overall Cognitive Status: Impaired/Different from baseline ?Area of Impairment: Orientation, Attention, Memory, Following commands, Problem solving ?  ?  ?  ?  ?  ?  ?  ?  ?Orientation Level: Situation ?Current Attention Level: Selective ?Memory: Decreased short-term memory ?Following Commands: Follows one step commands with increased time, Follows multi-step commands inconsistently ?  ?  ?Problem Solving: Decreased initiation, Requires verbal cues, Requires tactile cues, Slow processing ?General Comments: Patient oriented with exception to situation, then stating as OT was assisting to EOB "I was just on my way to get ice cream, but I dont want any anymore" family not present for evaluation, but when looking through the chart the patient typically does not have this level of confusion ?  ?  ?General Comments  VSS on RA ? ?  ?Exercises   ?  ?Shoulder Instructions    ? ? ?Home Living Family/patient expects to be discharged to:: Assisted living ?  ?  ?  ?  ?  ?  ?  ?  ?  ?  ?  ?  ?  ?  ?Home Equipment: Conservation officer, nature (2 wheels);Wheelchair - Education administrator (comment) (life chair) ?  ?Additional Comments: [facility] ?  ? ?  ?Prior Functioning/Environment Prior Level of Function : Needs assist ?  ?  ?  ?  ?  ?  ?Mobility Comments: typically modI for transfers with w/c, can propel w/c with BUE's to and from bathroom with supervision ?ADLs Comments: assist for ADL's, can transfer to shower seat and complete, but staff is always  present ?  ? ?  ?  ?OT Problem List: Decreased strength;Decreased range of motion;Decreased activity tolerance;Impaired balance (sitting and/or standing);Decreased coordination;Decreased cognition;Impaired vision/perception;Decreased safety awareness;Decreased knowledge of use of DME or AE;Decreased knowledge  of precautions;Obesity;Pain;Increased edema ?  ?   ?OT Treatment/Interventions: Self-care/ADL training;Therapeutic exercise;Energy conservation;DME and/or AE instruction;Therapeutic activities;Visual/perceptual re

## 2022-03-09 NOTE — Assessment & Plan Note (Addendum)
--   Stable, probably secondary to acute infection, follow-up as an outpatient ?

## 2022-03-09 NOTE — Assessment & Plan Note (Signed)
--  empiric tx w/ abx, no culture sent. ?

## 2022-03-09 NOTE — Hospital Course (Addendum)
88 year old woman presented with acute on chronic diastolic CHF and shortness of breath.  Other issues have included acute metabolic encephalopathy, UTI, AKI. UTI treated, mild AKI improved, stable, likely return to baseline as outpatient, check BMP in a week. Mentation improving, consistent with delirium. Seen by PT with recommendation for [facility].  ?

## 2022-03-09 NOTE — Progress Notes (Signed)
?  Progress Note ? ? ?Patient: Christina Morgan OJJ:[id number] DOB: [birth year] DOA: 03/06/2022     3 ?DOS: the patient was seen and examined on 03/09/2022 ?  ?Brief hospital course: ?88 year old woman presented with acute on chronic diastolic CHF and shortness of breath.  Other issues have included acute metabolic encephalopathy, possible UTI, AKI. ? ?Assessment and Plan: ?Acute on chronic diastolic CHF (congestive heart failure) (HCC) ?-- Normal LVEF by echocardiogram January 2023, which time diastolic function could not be characterized ?-- Appears euvolemic at this time, stop Lasix giving increasing creatinine. ?-- Continue atorvastatin, does not appear she is on any beta-blocker or calcium channel blocker, investigate further ? ?AKI superimposed on stage 3b chronic kidney disease ([place]) ?-- Slightly worse today, in the setting of diuresis, as she appears euvolemic, stop diuretics and monitor.  BMP in AM. ? ?PAF (paroxysmal atrial fibrillation) (HCC) ?-- Stable.  Continue amiodarone, apixaban, not on any rate control agent. ? ?Acute metabolic encephalopathy ?-- Reported to be his confusion and hallucinations, however on my examination the patient is alert and oriented and appropriate.  Appears resolved at this point.  May be secondary to acute illness and UTI. ? ?Elevated transaminase level ?--CMP in AM ? ?Thrombocytopenia (HCC) ?--CBC in AM ? ?Acute lower UTI ?--empiric tx w/ abx, no culture sent. ? ?GERD (gastroesophageal reflux disease) ?--continue PPI ? ?Dyslipidemia ?--statin ? ? ?  ? ?Subjective:  ?Feels ok ?Some nausea earlier ?Ate about 1/2 spaghetti ?No pain ?Breathing fine ? ?Physical Exam: ?Vitals:  ? 03/09/22 0429 03/09/22 0857 03/09/22 1349 03/09/22 1720  ?BP:  (!) 134/52 (!) 144/55 (!) 133/50  ?Pulse:  70 62 69  ?Resp:  '16 10 18  '$ ?Temp:  97.7 ?F (36.5 ?C) (!) 97.4 ?F (36.3 ?C) (!) 96.4 ?F (35.8 ?C)  ?TempSrc:  Oral Oral Axillary  ?SpO2:  95% 96% 95%  ?Weight: 70.8 kg     ?Height:      ? ?Physical  Exam ?Vitals reviewed.  ?Constitutional:   ?   General: She is not in acute distress. ?   Appearance: She is not ill-appearing or toxic-appearing.  ?Cardiovascular:  ?   Rate and Rhythm: Normal rate and regular rhythm.  ?   Heart sounds: No murmur heard. ?   Comments: Telemetry SR ?Pulmonary:  ?   Effort: Pulmonary effort is normal. No respiratory distress.  ?   Breath sounds: No wheezing, rhonchi or rales.  ?Neurological:  ?   General: No focal deficit present.  ?   Mental Status: She is alert and oriented to person, place, and time.  ?   Cranial Nerves: No cranial nerve deficit.  ?   Motor: No weakness.  ?Psychiatric:     ?   Mood and Affect: Mood normal.     ?   Behavior: Behavior normal.  ? ? ?Data Reviewed: ? ?Creatinine up to 2.43, BUN down to 22 ?AST 58 ?Plts stable 125 ? ?Family Communication: daughter by telephone ? ?Disposition: ?Status is: Inpatient ?Remains inpatient appropriate because: AKI ? Planned Discharge Destination: Home ? ? ? ?Time spent: 45 minutes ? ?Author: ?Murray Hodgkins, MD ?03/09/2022 7:05 PM ? ?For on call review [email or web address].  ?

## 2022-03-09 NOTE — Assessment & Plan Note (Addendum)
--   Etiology unclear but total bilirubin up consistent with dehydration.  Follow-up as an outpatient. ?

## 2022-03-09 NOTE — Assessment & Plan Note (Addendum)
--   Consistent with delirium with intermittent confusion, inappropriate speech and hallucinations.  Again today alert and oriented with appropriate speech on my examination.  No worrisome signs, suspect will clear over the next week spontaneously. ?

## 2022-03-09 NOTE — Evaluation (Signed)
Physical Therapy Evaluation ?Patient Details ?Name: Christina Morgan ?MRN: [id number] ?DOB: 1933/02/15 ?Today's Date: 03/09/2022 ? ?History of Present Illness ? Patient is an 88 yo female presenting to the ED on 03/06/22 with SOB and BLE edema. Admitted with acute on chronic diastolic heart failure. PMH includes: chronic diastolic heart failure, chronic right ventricular systolic heart failure, paroxysmal atrial fibrillation chronically anticoagulated on Eliquis, hypertension, hyperlipidemia, stage IIIB chronic kidney disease,  and chronic anemia with baseline hemoglobin 9-11  ?Clinical Impression ? PTA, pt lives at [facility], is modI for transfers and supervision for w/c mobility, and requires assist for ADL's. Pt daughter reporting pt with acute confusion and hallucinatory. Pt reports seeing family members in room that are not present. Pt requiring maximal assist for bed mobility and minimal assist for transfers from bed to chair using walker. Pt is pleasant and able to follow 1-2 step commands. Suspect pt will progress pending improvement in cognition. Will follow acutely. ?   ? ?Recommendations for follow up therapy are one component of a multi-disciplinary discharge planning process, led by the attending physician.  Recommendations may be updated based on patient status, additional functional criteria and insurance authorization. ? ?Follow Up Recommendations Home health PT (at [facility] pending progress and improvement in cognition) ? ?  ?Assistance Recommended at Discharge Frequent or constant Supervision/Assistance  ?Patient can return home with the following ? A little help with walking and/or transfers;A little help with bathing/dressing/bathroom ? ?  ?Equipment Recommendations None recommended by PT  ?Recommendations for Other Services ?    ?  ?Functional Status Assessment Patient has had a recent decline in their functional status and demonstrates the ability to make significant  improvements in function in a reasonable and predictable amount of time.  ? ?  ?Precautions / Restrictions Precautions ?Precautions: Fall ?Restrictions ?Weight Bearing Restrictions: No  ? ?  ? ?Mobility ? Bed Mobility ?Overal bed mobility: Needs Assistance ?Bed Mobility: Supine to Sit ?  ?  ?Supine to sit: Max assist ?  ?  ?General bed mobility comments: Pt initiating somewhat bringing BLE's to edge of bed, assist to execute, scoot hips with bed pad, and assist trunk to upright ?  ? ?Transfers ?Overall transfer level: Needs assistance ?Equipment used: Rolling walker (2 wheels) ?Transfers: Sit to/from Stand, Bed to chair/wheelchair/BSC ?Sit to Stand: Min assist ?  ?Step pivot transfers: Min assist ?  ?  ?  ?General transfer comment: MinA to rise from edge of bed, taking pivotal steps to chair. Decreased clearance of RLE ?  ? ?Ambulation/Gait ?  ?  ?  ?  ?  ?  ?  ?General Gait Details: not typically ambulatory at baseline ? ?Stairs ?  ?  ?  ?  ?  ? ?Wheelchair Mobility ?  ? ?Modified Rankin (Stroke Patients Only) ?  ? ?  ? ?Balance Overall balance assessment: Needs assistance ?Sitting-balance support: Feet supported ?Sitting balance-Leahy Scale: Good ?  ?  ?Standing balance support: Bilateral upper extremity supported ?Standing balance-Leahy Scale: Poor ?Standing balance comment: reliant on RW ?  ?  ?  ?  ?  ?  ?  ?  ?  ?  ?  ?   ? ? ? ?Pertinent Vitals/Pain Pain Assessment ?Pain Assessment: Faces ?Faces Pain Scale: No hurt  ? ? ?Home Living Family/patient expects to be discharged to:: Assisted living ?  ?  ?  ?  ?  ?  ?  ?  ?Home Equipment: Conservation officer, nature (2 wheels);Wheelchair -  manual;Other (comment) (life chair) ?Additional Comments: [facility]  ?  ?Prior Function Prior Level of Function : Needs assist ?  ?  ?  ?  ?  ?  ?Mobility Comments: typically modI for transfers with w/c, can propel w/c with BUE's to and from bathroom with supervision ?ADLs Comments: assist for ADL's, can transfer  to shower seat and complete, but staff is always present ?  ? ? ?Hand Dominance  ?   ? ?  ?Extremity/Trunk Assessment  ? Upper Extremity Assessment ?Upper Extremity Assessment: Generalized weakness ?  ? ?Lower Extremity Assessment ?Lower Extremity Assessment: Generalized weakness ?  ? ?Cervical / Trunk Assessment ?Cervical / Trunk Assessment: Kyphotic  ?Communication  ? Communication: No difficulties  ?Cognition Arousal/Alertness: Awake/alert ?Behavior During Therapy: [facility] for tasks assessed/performed ?Overall Cognitive Status: Impaired/Different from baseline ?Area of Impairment: Memory, Following commands, Problem solving ?  ?  ?  ?  ?  ?  ?  ?  ?  ?  ?Memory: Decreased short-term memory ?Following Commands: Follows one step commands with increased time, Follows multi-step commands inconsistently ?  ?  ?Problem Solving: Decreased initiation, Requires verbal cues, Requires tactile cues, Slow processing ?General Comments: Pt daughter reports acute confusion. Pt hallucinating, seeing pt family members in the room that are not there. Overall pleasant and able to follow 1 and 2 step commands ?  ?  ? ?  ?General Comments General comments (skin integrity, edema, etc.): VSS on RA ? ?  ?Exercises General Exercises - Lower Extremity ?Long Arc Quad: Both, 10 reps, Seated ?Hip Flexion/Marching: Both, 10 reps, Seated  ? ?Assessment/Plan  ?  ?PT Assessment Patient needs continued PT services  ?PT Problem List Decreased strength;Decreased activity tolerance;Decreased balance;Decreased mobility;Decreased cognition;Decreased safety awareness ? ?   ?  ?PT Treatment Interventions DME instruction;Functional mobility training;Therapeutic activities;Therapeutic exercise;Balance training;Patient/family education   ? ?PT Goals (Current goals can be found in the Care Plan section)  ?Acute Rehab PT Goals ?Patient Stated Goal: pt daughter goal is for pt cognition to improve ?PT Goal Formulation: With patient/family ?Time For Goal  Achievement: 03/23/22 ?Potential to Achieve Goals: Fair ? ?  ?Frequency Min 3X/week ?  ? ? ?Co-evaluation   ?  ?  ?  ?  ? ? ?  ?AM-PAC PT "6 Clicks" Mobility  ?Outcome Measure Help needed turning from your back to your side while in a flat bed without using bedrails?: A Little ?Help needed moving from lying on your back to sitting on the side of a flat bed without using bedrails?: A Lot ?Help needed moving to and from a bed to a chair (including a wheelchair)?: A Little ?Help needed standing up from a chair using your arms (e.g., wheelchair or bedside chair)?: A Little ?Help needed to walk in hospital room?: Total ?Help needed climbing 3-5 steps with a railing? : Total ?6 Click Score: 13 ? ?  ?End of Session Equipment Utilized During Treatment: Gait belt ?Activity Tolerance: Patient tolerated treatment well ?Patient left: in chair;with call bell/phone within reach;with chair alarm set ?Nurse Communication: Mobility status ?PT Visit Diagnosis: Unsteadiness on feet (R26.81);Muscle weakness (generalized) (M62.81);Difficulty in walking, not elsewhere classified (R26.2) ?  ? ?Time: 1045-1110 ?PT Time Calculation (min) (ACUTE ONLY): 25 min ? ? ?Charges:   PT Evaluation ?$PT Eval Moderate Complexity: 1 Mod ?PT Treatments ?$Therapeutic Activity: 8-22 mins ?  ?   ? ? ?Wyona Almas, PT, DPT ?Acute Rehabilitation Services ?Pager [phone] ?Office [phone] ? ? ?Car[place]ine  Margo Aye ?03/09/2022, 12:18 PM ? ?

## 2022-03-10 DIAGNOSIS — N179 Acute kidney failure, unspecified: Secondary | ICD-10-CM | POA: Diagnosis not present

## 2022-03-10 DIAGNOSIS — G9341 Metabolic encephalopathy: Secondary | ICD-10-CM | POA: Diagnosis not present

## 2022-03-10 DIAGNOSIS — I5033 Acute on chronic diastolic (congestive) heart failure: Secondary | ICD-10-CM | POA: Diagnosis not present

## 2022-03-10 DIAGNOSIS — N390 Urinary tract infection, site not specified: Secondary | ICD-10-CM | POA: Diagnosis not present

## 2022-03-10 LAB — CBC
HCT: 33.8 % — ABNORMAL LOW (ref 36.0–46.0)
Hemoglobin: 11 g/dL — ABNORMAL LOW (ref 12.0–15.0)
MCH: 31.5 pg (ref 26.0–34.0)
MCHC: 32.5 g/dL (ref 30.0–36.0)
MCV: 96.8 fL (ref 80.0–100.0)
Platelets: 130 10*3/uL — ABNORMAL LOW (ref 150–400)
RBC: 3.49 MIL/uL — ABNORMAL LOW (ref 3.87–5.11)
RDW: 16.6 % — ABNORMAL HIGH (ref 11.5–15.5)
WBC: 3.5 10*3/uL — ABNORMAL LOW (ref 4.0–10.5)
nRBC: 0 % (ref 0.0–0.2)

## 2022-03-10 LAB — COMPREHENSIVE METABOLIC PANEL
ALT: 32 U/L (ref 0–44)
AST: 60 U/L — ABNORMAL HIGH (ref 15–41)
Albumin: 2.8 g/dL — ABNORMAL LOW (ref 3.5–5.0)
Alkaline Phosphatase: 80 U/L (ref 38–126)
Anion gap: 17 — ABNORMAL HIGH (ref 5–15)
BUN: 24 mg/dL — ABNORMAL HIGH (ref 8–23)
CO2: 30 mmol/L (ref 22–32)
Calcium: 9.1 mg/dL (ref 8.9–10.3)
Chloride: 99 mmol/L (ref 98–111)
Creatinine, Ser: 2.47 mg/dL — ABNORMAL HIGH (ref 0.44–1.00)
GFR, Estimated: 19 mL/min — ABNORMAL LOW (ref >60)
Glucose, Bld: 63 mg/dL — ABNORMAL LOW (ref 70–99)
Potassium: 4.4 mmol/L (ref 3.5–5.1)
Sodium: 146 mmol/L — ABNORMAL HIGH (ref 135–145)
Total Bilirubin: 1.6 mg/dL — ABNORMAL HIGH (ref 0.3–1.2)
Total Protein: 5.4 g/dL — ABNORMAL LOW (ref 6.5–8.1)

## 2022-03-10 NOTE — Progress Notes (Signed)
Mobility Specialist Progress Note ? ? 03/10/22 1445  ?Mobility  ?Activity Transferred from bed to chair;Turned to right side;Turned to left side;Turned to back - supine  ?Level of Assistance Minimal assist, patient does 75% or more  ?Assistive Device Front wheel walker  ?Activity Response Tolerated well  ?$Mobility charge 1 Mobility  ? ?Received pt in bed having no complaints and agreeable. Pt requiring minA to get LE to EOB and trunk upright d/t weakness. Upon sitting up pt had soiled themselves and required pericare w/ the assistance of a NT. Once cleaned and back to EOB. Pt requiring only minG to SPT to chair. No complaints throughout, left call in reach and daughter in room. ? ?Holland Falling ?Mobility Specialist ?Phone Number [phone] ? ?

## 2022-03-10 NOTE — Progress Notes (Addendum)
[facility]) Hospital Liaison note: ? ?This is a pending outpatient-based Palliative Care patient. Will continue to follow for disposition. ? ?Please call with any outpatient palliative questions or concerns. ? ?Thank you, ?Lorelee Market, LPN ?[facility] Hospital Liaison ?[phone] ?

## 2022-03-10 NOTE — Progress Notes (Signed)
?  Progress Note ? ? ?Patient: Christina Morgan DXA:[id number] DOB: August 07, 1933 DOA: 03/06/2022     4 ?DOS: the patient was seen and examined on 03/10/2022 ?  ?Brief hospital course: ?88 year old woman presented with acute on chronic diastolic CHF and shortness of breath.  Other issues have included acute metabolic encephalopathy, UTI, AKI.  Gradually improving.  Now a little dry from diuresis and poor oral intake.  Repeat chemistry panel in the morning, if stable anticipate return to ALF with slow clearing of delirium as an outpatient. ? ?Assessment and Plan: ?Acute on chronic diastolic CHF (congestive heart failure) (HCC) ?-- Normal LVEF by echocardiogram January 2023, at which time diastolic function could not be characterized ?-- Appears euvolemic. Lasix stopped 3/29 afternoon given increasing creatinine. Encourage fluid intake. Oral intake poor. ?-- Continue atorvastatin, does not appear she is on any beta-blocker or calcium channel blocker  ? ?AKI superimposed on stage 3b chronic kidney disease ([place]) ?-- Slightly worse today, in the setting of diuresis stopped yesterday afternoon. Poor intake. Encourage fluids. BMP in AM. ? ?PAF (paroxysmal atrial fibrillation) (HCC) ?-- Stable.  Continue amiodarone, apixaban  ? ?Acute metabolic encephalopathy ?-- Consistent with delirium with intermittent confusion, inappropriate speech and hallucinations.  Again today alert and oriented with appropriate speech on my examination.  No worrisome signs, suspect will clear over the next week spontaneously. ? ?Elevated transaminase level ?-- Etiology unclear but total bilirubin up consistent with dehydration.  Follow-up as an outpatient. ? ?Thrombocytopenia ([place]) ?-- Stable, probably secondary to acute infection, follow-up as an outpatient ? ?Acute lower UTI ?--empiric tx w/ abx, no culture sent. ? ?GERD (gastroesophageal reflux disease) ?--continue PPI ? ?Dyslipidemia ?--statin ? ? ? ? ?  ? ?Subjective:  ?Feels ok, no  complaints ?Poor appetite ?Aware of hallucinations ?No vomiting ?Bedside RN and daughter report confusions and hallucinations at times. ? ?Physical Exam: ?Vitals:  ? 03/10/22 1028 03/10/22 1153 03/10/22 1341 03/10/22 1546  ?BP: (!) 143/85 (!) 131/48 (!) 143/56 118/63  ?Pulse:  65  65  ?Resp:  14  15  ?Temp:  98.2 ?F (36.8 ?C)  (!) 97.4 ?F (36.3 ?C)  ?TempSrc:  Oral  Oral  ?SpO2:  93%  97%  ?Weight:      ?Height:      ? ?Physical Exam ?Vitals reviewed.  ?Constitutional:   ?   General: She is not in acute distress. ?   Appearance: She is not ill-appearing or toxic-appearing.  ?Cardiovascular:  ?   Rate and Rhythm: Normal rate and regular rhythm.  ?   Heart sounds: No murmur heard. ?Pulmonary:  ?   Effort: Pulmonary effort is normal. No respiratory distress.  ?   Breath sounds: No wheezing, rhonchi or rales.  ?Neurological:  ?   Mental Status: She is alert and oriented to person, place, and time.  ?Psychiatric:     ?   Mood and Affect: Mood normal.     ?   Behavior: Behavior normal.     ?   Thought Content: Thought content normal.  ? ? ?Data Reviewed: ? ?Creatinine stable 2.47 ?Plts up to 130 ? ?Family Communication: daughter at bedside ? ?Disposition: ?Status is: Inpatient ?Remains inpatient appropriate because: delirium, dehydration, poor oral intake ? Planned Discharge Destination:  return to ALF ? ? ? ?Time spent: 25 minutes ? ?Author: ?Murray Hodgkins, MD ?03/10/2022 4:07 PM ? ?For on call review [email or web address].  ?

## 2022-03-10 NOTE — Progress Notes (Signed)
Mobility Specialist Progress Note ? ? 03/10/22 1700  ?Mobility  ?Activity Transferred from chair to bed  ?Level of Assistance Contact guard assist, steadying assist  ?Assistive Device Front wheel walker  ?Activity Response Tolerated well  ?$Mobility charge 1 Mobility  ? ?Pt requesting to get from chair to bed d/t discomfort. Required minG throughout with use of RW for transfer. Pt left in bed with all needs met, and call bell in reach. ? ?Holland Falling ?Mobility Specialist ?Phone Number [phone] ? ?

## 2022-03-10 NOTE — Care Management Important Message (Signed)
Important Message ? ?Patient Details  ?Name: Christina Morgan ?MRN: [id number] ?Date of Birth: [birth year] ? ? ?Medicare Important Message Given:  Yes ? ? ? ? ?Shelda Altes ?03/10/2022, 10:28 AM ?

## 2022-03-11 DIAGNOSIS — G9341 Metabolic encephalopathy: Secondary | ICD-10-CM | POA: Diagnosis not present

## 2022-03-11 DIAGNOSIS — L89626 Pressure-induced deep tissue damage of left heel: Secondary | ICD-10-CM

## 2022-03-11 DIAGNOSIS — I5033 Acute on chronic diastolic (congestive) heart failure: Secondary | ICD-10-CM | POA: Diagnosis not present

## 2022-03-11 DIAGNOSIS — N390 Urinary tract infection, site not specified: Secondary | ICD-10-CM | POA: Diagnosis not present

## 2022-03-11 LAB — COMPREHENSIVE METABOLIC PANEL
ALT: 31 U/L (ref 0–44)
AST: 43 U/L — ABNORMAL HIGH (ref 15–41)
Albumin: 2.6 g/dL — ABNORMAL LOW (ref 3.5–5.0)
Alkaline Phosphatase: 78 U/L (ref 38–126)
Anion gap: 13 (ref 5–15)
BUN: 26 mg/dL — ABNORMAL HIGH (ref 8–23)
CO2: 32 mmol/L (ref 22–32)
Calcium: 8.7 mg/dL — ABNORMAL LOW (ref 8.9–10.3)
Chloride: 97 mmol/L — ABNORMAL LOW (ref 98–111)
Creatinine, Ser: 2.33 mg/dL — ABNORMAL HIGH (ref 0.44–1.00)
GFR, Estimated: 20 mL/min — ABNORMAL LOW (ref >60)
Glucose, Bld: 72 mg/dL (ref 70–99)
Potassium: 3.2 mmol/L — ABNORMAL LOW (ref 3.5–5.1)
Sodium: 142 mmol/L (ref 135–145)
Total Bilirubin: 1 mg/dL (ref 0.3–1.2)
Total Protein: 5.1 g/dL — ABNORMAL LOW (ref 6.5–8.1)

## 2022-03-11 MED ORDER — POTASSIUM CHLORIDE CRYS ER 20 MEQ PO TBCR
40.0000 meq | EXTENDED_RELEASE_TABLET | Freq: Once | ORAL | Status: AC
  Administered 2022-03-11: 40 meq via ORAL
  Filled 2022-03-11: qty 2

## 2022-03-11 NOTE — Assessment & Plan Note (Signed)
--  Pressure Injury 03/06/22 Heel Left Deep Tissue Pressure Injury - Purple or maroon localized area of discolored intact skin or blood-filled blister due to damage of underlying soft tissue from pressure and/or shear. ?--Foam dressings to heels, change every 3 days or PRN soiling ?

## 2022-03-11 NOTE — Progress Notes (Signed)
Physical Therapy Treatment ?Patient Details ?Name: Christina Morgan ?MRN: [id number] ?DOB: [birth year] ?Today's Date: 03/11/2022 ? ? ?History of Present Illness Patient is an 88 yo female presenting to the ED on 03/06/22 with SOB and BLE edema. Admitted with acute on chronic diastolic heart failure. PMH includes: chronic diastolic heart failure, chronic right ventricular systolic heart failure, paroxysmal atrial fibrillation chronically anticoagulated on Eliquis, hypertension, hyperlipidemia, stage IIIB chronic kidney disease,  and chronic anemia with baseline hemoglobin 9-11. ? ?  ?PT Comments  ? ? Pt received in supine, agreeable to therapy session with emphasis on UE/LE therapeutic exercises, seated balance tasks and transfer training. Pt needing up to minA for seated balance with reaching/unsupported sitting and for sit<>stand transfers, defer OOB due to pt bowel incontinence noted upon standing and pt needing bed bath/linen change, RN/NT made aware. Pt given HEP handout for LE exercises due to pt decreased short term memory, pt/family appreciative. Pt continues to benefit from PT services to progress toward functional mobility goals.    ?Recommendations for follow up therapy are one component of a multi-disciplinary discharge planning process, led by the attending physician.  Recommendations may be updated based on patient status, additional functional criteria and insurance authorization. ? ?Follow Up Recommendations ? Home health PT (at [facility] pending progress and improvement in cognition) ?  ?  ?Assistance Recommended at Discharge Frequent or constant Supervision/Assistance  ?Patient can return home with the following A little help with walking and/or transfers;A little help with bathing/dressing/bathroom ?  ?Equipment Recommendations ? None recommended by PT  ?  ?Recommendations for Other Services   ? ? ?  ?Precautions / Restrictions Precautions ?Precautions: Fall ?Precaution Comments: B/B  incontinence ?Restrictions ?Weight Bearing Restrictions: No  ?  ? ?Mobility ? Bed Mobility ?Overal bed mobility: Needs Assistance ?Bed Mobility: Supine to Sit ?  ?  ?Supine to sit: Mod assist, HOB elevated ?Sit to supine: Mod assist, HOB elevated ?  ?General bed mobility comments: cues for cross-body reaching to opposite bed rail and for improved body mechanics, modA for trunk rise and anterior scooting to foot flat. ?  ? ?Transfers ?Overall transfer level: Needs assistance ?Equipment used: Rolling walker (2 wheels) ?Transfers: Sit to/from Stand ?Sit to Stand: Min assist ?  ?  ?  ?  ?  ?General transfer comment: MinA to rise from edge of bed, pt noted to be incontinent with soiled gown/sheets upon standing so defer pivot to recliner, RN notified pt needs a bath; pt lateral seated scooting toward HOB with min guard ?  ? ?Ambulation/Gait ?  ?   ?General Gait Details: not typically ambulatory at baseline ? ? ? ?  ?Balance Overall balance assessment: Needs assistance ?Sitting-balance support: Feet supported, Bilateral upper extremity supported ?Sitting balance-Leahy Scale: Fair ?Sitting balance - Comments: after assist and cues for improved body mechanics, pt able to sit upright with BUE support but noted posterior bias and multiple LOB with dynamic seated tasks needing min guard to minA to correct ?  ?Standing balance support: Bilateral upper extremity supported ?Standing balance-Leahy Scale: Poor ?Standing balance comment: reliant on RW, posterior bias ?  ?  ?   ?Cognition Arousal/Alertness: Awake/alert ?Behavior During Therapy: [facility] for tasks assessed/performed ?Overall Cognitive Status: Impaired/Different from baseline ?Area of Impairment: Memory ?  ?   ?Memory: Decreased short-term memory ?Following Commands: Follows one step commands consistently ?  ?  ?Problem Solving: Requires verbal cues, Slow processing ?General Comments: Overall pleasant and able to follow 1 and 2 step  commands. Pt with bowel/bladder  incontinence noticed upon standing at EOB, pt did not report to PTA prior and states she was unaware, RN notified pt needing full bath and new bed sheets. ?  ?  ? ?  ?Exercises General Exercises - Lower Extremity ?Ankle Circles/Pumps: AROM, Both, 10 reps, Supine ?Long Arc Quad: Both, 10 reps, Seated, AROM ?Heel Slides: AROM, Both, 10 reps, Supine ?Hip ABduction/ADduction: AROM, Both, 10 reps, Supine ?Hip Flexion/Marching: Both, 10 reps, Seated ? ?  ?General Comments General comments (skin integrity, edema, etc.): Spo2 96% on RA ?  ?  ? ?Pertinent Vitals/Pain Pain Assessment ?Pain Assessment: No/denies pain ?Faces Pain Scale: No hurt ?Pain Intervention(s): Monitored during session, Repositioned  ? ? ? ?PT Goals (current goals can now be found in the care plan section) Acute Rehab PT Goals ?Patient Stated Goal: pt daughter goal is for pt cognition to improve ?PT Goal Formulation: With patient/family ?Time For Goal Achievement: 03/23/22 ?Progress towards PT goals: Progressing toward goals ? ?  ?Frequency ? ? ? Min 3X/week ? ? ? ?  ?PT Plan Current plan remains appropriate  ? ? ?   ?AM-PAC PT "6 Clicks" Mobility   ?Outcome Measure ? Help needed turning from your back to your side while in a flat bed without using bedrails?: A Little ?Help needed moving from lying on your back to sitting on the side of a flat bed without using bedrails?: A Lot ?Help needed moving to and from a bed to a chair (including a wheelchair)?: A Little ?Help needed standing up from a chair using your arms (e.g., wheelchair or bedside chair)?: A Little ?Help needed to walk in hospital room?: Total ?Help needed climbing 3-5 steps with a railing? : Total ?6 Click Score: 13 ? ?  ?End of Session Equipment Utilized During Treatment: Gait belt ?Activity Tolerance: Patient tolerated treatment well;Other (comment) (bowel incontinence limiting mobility progression) ?Patient left: with call bell/phone within reach;in bed;with bed alarm set ?Nurse  Communication: Mobility status ?PT Visit Diagnosis: Unsteadiness on feet (R26.81);Muscle weakness (generalized) (M62.81);Difficulty in walking, not elsewhere classified (R26.2) ?  ? ? ?Time: 8786-7672 ?PT Time Calculation (min) (ACUTE ONLY): 16 min ? ?Charges:  $Therapeutic Exercise: 8-22 mins          ?          ? ?Sevastian Witczak P., PTA ?Acute Rehabilitation Services ?Secure Chat Preferred 9a-5:30pm ?Office: [phone]  ? ? ?Kara Pacer Tamel Abel ?03/11/2022, 12:30 PM ? ?

## 2022-03-11 NOTE — TOC Transition Note (Addendum)
Transition of Care (TOC) - CM/SW Discharge Note ? ? ?Patient Details  ?Name: Christina Morgan ?MRN: [id number] ?Date of Birth: 1933-06-11 ? ?Transition of Care (TOC) CM/SW Contact:  ?Vinie Sill, LCSW ?Phone Number: ?03/11/2022, 10:26 AM ? ? ?Clinical Narrative:    ? ?CSW spoke with patient's daughter,Breshay. Amily, confirmed patient is from [facility] and is agreeable to her returning.  ? ?CSW informed [facility] patient will be returning today. ? ?Patient will Discharge to: [facility] ?Discharge Date: 03/11/2022 ?Family Notified: daughter ?Transport [id number] ? ?Per MD patient is ready for discharge. RN, patient, and facility notified of discharge. Discharge Summary sent to facility. RN given number for [phone]. Patient will be transported by her daughter Alizay. ? ? ?Clinical Social Worker signing off. ? ?Thurmond Butts, MSW, LCSW ?Clinical Social Worker ? ? ? ? ?Final next level of care: Assisted Living ?Barriers to Discharge: Barriers Resolved ? ? ?Patient Goals and CMS Choice ?  ?  ?  ? ?Discharge Placement ?  ?           ?Patient chooses bed at:  [facility]) ?Patient to be transferred to facility by: family member ?Name of family member notified: daugter ?Patient and family notified of of transfer: 03/11/22 ? ?Discharge Plan and Services ?In-house Referral: Clinical Social Work ?Discharge Planning Services: CM Consult ?Post Acute Care Choice: Home Health, Resumption of Svcs/PTA Provider          ?  ?  ?  ?  ?  ?HH Arranged: RN, PT ?[place] Agency: [facility] ?Date HH Agency Contacted: 03/08/22 ?  ?Representative spoke with at [place]: Marjory Lies ? ?Social Determinants of Health (SDOH) Interventions ?  ? ? ?Readmission Risk Interventions ?   ? View : No data to display.  ?  ?  ?  ? ? ? ? ? ?

## 2022-03-11 NOTE — Discharge Summary (Addendum)
?Physician Discharge Summary ?  ?Patient: Christina Morgan MRN: [id number] DOB: Aug 31, 1933  ?Admit date:     03/06/2022  ?Discharge date: 03/11/22  ?Discharge Physician: Murray Hodgkins  ? ?PCP: [facility]  ? ?Recommendations at discharge:  ? ?Ongoing treatment for diastolic CHF ?Monitor CKD IIIb with mild AKI, suggest serial BMP, first 3-7 days ?Resolution of delirium ?HH PT, OT, RN ?Resolution of mild thrombocytopenia and elevated AST expected, suggest outpatient follow-up lab ? ?Discharge Diagnoses: ?Principal Problem: ?  Acute on chronic diastolic CHF (congestive heart failure) ([place]) ?Active Problems: ?  PAF (paroxysmal atrial fibrillation) ([place]) ?  AKI superimposed on stage 3b chronic kidney disease ([place]) ?  Acute metabolic encephalopathy ?  Acute lower UTI ?  Thrombocytopenia ([place]) ?  Elevated transaminase level ?  GERD (gastroesophageal reflux disease) ?  Pressure injury of deep tissue of left heel ? ?Resolved Problems: ?  Nausea & vomiting ?  Leukocytosis ? ?Hospital Course: ?88 year old woman presented with acute on chronic diastolic CHF and shortness of breath.  Other issues have included acute metabolic encephalopathy, UTI, AKI. UTI treated, mild AKI improved, stable, likely return to baseline as outpatient, check BMP in a week. Mentation improving, consistent with delirium. Seen by PT with recommendation for [facility].  ? ?Acute on chronic diastolic CHF (congestive heart failure) (HCC) ?-- Normal LVEF by echocardiogram January 2023, at which time diastolic function could not be characterized ?-- Appears euvolemic. Lasix stopped 3/29 afternoon given increasing creatinine. Encourage fluid intake. Oral intake poor. ?-- Continue atorvastatin, does not appear she is on any beta-blocker or calcium channel blocker  ?-- stable ? ?AKI superimposed on stage 3b chronic kidney disease ([place]) ?-- better today, in the setting of diuresis stopped yesterday afternoon. Poor intake.  ?-- encourage oral intake ?--  expect slow improvement, follow BMP as outpatient ? ?PAF (paroxysmal atrial fibrillation) (HCC) ?-- Stable.  Continue amiodarone, apixaban  ? ?Acute metabolic encephalopathy ?-- Consistent with delirium with intermittent confusion, inappropriate speech and hallucinations.  Slowly improving, alert and oriented with appropriate speech on examination.  No worrisome signs, suspect will clear over the next week spontaneously. ? ?Elevated transaminase level ?-- Etiology unclear but trending down and tbili now normal, clinical significance unclear, follow as outpatient ? ?Thrombocytopenia ([place]) ?-- Stable, probably secondary to acute infection, follow-up as an outpatient ? ?Acute lower UTI ?--empiric tx w/ abx, no culture sent. ? ?GERD (gastroesophageal reflux disease) ?--continue PPI ? ?Dyslipidemia ?--statin ? ? ? ? ?  ? ?Consultants: none ?Procedures performed: none  ?Disposition:  ALF w/ HH PT, OT, RN ?Diet recommendation:  ?Discharge Diet Orders (From admission, onward)  ? ?  Start     Ordered  ? 03/11/22 0000  Diet - low sodium heart healthy       ? 03/11/22 0956  ? ?  ?  ? ?  ? ?DISCHARGE MEDICATION: ?Allergies as of 03/11/2022   ? ?   Reactions  ? Penicillins Itching, Other (See Comments)  ? Dizziness to the patient of passing out- told by MD to "never take this again" ?Did it involve swelling of the face/tongue/throat, SOB, or low BP? No ?Did it involve sudden or severe rash/hives, skin peeling, or any reaction on the inside of your mouth or nose? No ?Did you need to seek medical attention at a hospital or doctor's office? No ?When did it last happen? "a long time ago"    ?If all above answers are "NO", may proceed with cephalosporin use.  ? ?  ? ?  ?  Medication List  ?  ? ?TAKE these medications   ? ?acetaminophen 500 MG tablet ?Commonly known as: TYLENOL ?Take 1,000 mg by mouth 3 (three) times daily as needed for moderate pain or mild pain. ?  ?albuterol 108 (90 Base) MCG/ACT inhaler ?Commonly known as: VENTOLIN  HFA ?Inhale 2 puffs into the lungs every 4 (four) hours as needed for wheezing or shortness of breath (please try to use ALbuterol inhaler PRIOR to Nebs). ?  ?amiodarone 200 MG tablet ?Commonly known as: PACERONE ?Take 1 tablet (200 mg total) by mouth daily. ?  ?amLODipine 5 MG tablet ?Commonly known as: NORVASC ?Take 1 tablet (5 mg total) by mouth daily. ?  ?apixaban 2.5 MG Tabs tablet ?Commonly known as: ELIQUIS ?Take 1 tablet (2.5 mg total) by mouth 2 (two) times daily. ?  ?ascorbic acid 500 MG tablet ?Commonly known as: VITAMIN C ?Take 1 tablet (500 mg total) by mouth daily. ?  ?atorvastatin 10 MG tablet ?Commonly known as: LIPITOR ?Take 1 tablet (10 mg total) by mouth at bedtime. ?  ?DAILY VITE PO ?Take 1 tablet by mouth daily. ?  ?docusate sodium 100 MG capsule ?Commonly known as: COLACE ?Take 100 mg by mouth daily. ?  ?hydrALAZINE 50 MG tablet ?Commonly known as: APRESOLINE ?Take 1 tablet (50 mg total) by mouth every 8 (eight) hours. ?  ?letrozole 2.5 MG tablet ?Commonly known as: [place] ?Take 2.5 mg by mouth daily. ?  ?loperamide 2 MG tablet ?Commonly known as: IMODIUM A-D ?Take 2 mg by mouth every 6 (six) hours as needed for diarrhea or loose stools. Do not exceed '8mg'$  in 24 hours. ?  ?nystatin powder ?Generic drug: nystatin ?Apply 1 application. topically See admin instructions. Spread topically to area under bilateral breasts & groin twice a day at 8 AM and 8 PM ?  ?omeprazole 20 MG capsule ?Commonly known as: PRILOSEC ?Take 20 mg by mouth daily. ?  ?polyethylene glycol 17 g packet ?Commonly known as: MIRALAX / GLYCOLAX ?Take 17 g by mouth daily as needed (constipation). ?  ?torsemide 20 MG tablet ?Commonly known as: DEMADEX ?Take 10 mg by mouth daily as needed (weight gain or 5lbs in 1 week or swelling). This is in conjunction with the daily Torsemide patient takes. ?  ?torsemide 10 MG tablet ?Commonly known as: DEMADEX ?Take 10 mg by mouth every Monday, Wednesday, and Friday. ?  ?vitamin B-12 250 MCG  tablet ?Commonly known as: CYANOCOBALAMIN ?Take 250 mcg by mouth every other day. ?  ?Vitamin D 125 MCG (5000 UT) Caps ?Take 125 mcg by mouth daily. ?  ? ?  ? ?  ?  ? ? ?  ?Discharge Care Instructions  ?(From admission, onward)  ?  ? ? ?  ? ?  Start     Ordered  ? 03/11/22 0000  Discharge wound care:       ?Comments: Foam dressing  Until discontinued      ?Comments: Foam dressings to heels, change Q 3 days or PRN soiling ? 03/07/22 1035   ?  03/07/22 1035    ?Wound care  Until discontinued      ?Change wraps to bilat legs every M/W/F as follows: foam dressing to left calf.  Apply kerlex in a spiral fashion, beginning just behind toes, to below knees, then ace wrap in the same manner.  ? 03/11/22 0956  ? ?  ?  ? ?  ? ? Follow-up Information   ? ? [facility] Schedule an appointment  as soon as possible for a visit in 1 week(s).   ?Specialty: Geriatric Medicine ?Contact information: ?[address] ?[address] ?[place] [address] ?[phone] ? ? ?  ?  ? ? Sueanne Margarita, MD .   ?Specialty: Cardiology ?Contact information: ?[address] ?[address] ?[place] [address] ?[phone] ? ? ?  ?  ? ? Camnitz, Ocie Doyne, MD .   ?Specialty: Cardiology ?Contact information: ?[address] ?[address] ?[place] [address] ?[phone] ? ? ?  ?  ? ?  ?  ? ?  ? ?Feels ok ?Ate better today ?Wants me to call her daughter ?Discharge Exam: ?Filed Weights  ? 03/08/22 0411 03/09/22 0429 03/10/22 0441  ?Weight: 74.2 kg 70.8 kg 68.7 kg  ? ?Physical Exam ?Vitals reviewed.  ?Constitutional:   ?   General: She is not in acute distress. ?   Appearance: She is not ill-appearing or toxic-appearing.  ?Cardiovascular:  ?   Rate and Rhythm: Normal rate and regular rhythm.  ?   Heart sounds: No murmur heard. ?   Comments: Telemetry SR ?Pulmonary:  ?   Effort: Pulmonary effort is normal. No respiratory distress.  ?   Breath sounds: No wheezing, rhonchi or rales.  ?Abdominal:  ?   Palpations: Abdomen is soft.   ?Neurological:  ?   Mental Status: She is alert and oriented to person, place, and time.  ?   Comments: Oriented to self, location, month, year  ?Psychiatric:     ?   Mood and Affect: Mood normal.     ?   Behavior

## 2022-03-11 NOTE — [place] FL2 (Addendum)
?[place] MEDICAID FL2 LEVEL OF CARE SCREENING TOOL  ?  ? ?IDENTIFICATION  ?Patient Name: ?Christina Morgan Birthdate: September 18, 1933 Sex: female Admission Date (Current Location): ?03/06/2022  ?[place] and [place] Number: ? [place] ?  Facility and Address:  ?[facility], [place] [address], [place], [place] [address] ?     Provider Number: ?[id number]  ?Attending Physician Name and Address:  ?No att. providers found ? Relative Name and Phone Number:  ?  ?   ?Current Level of Care: ?SNF Recommended Level of Care: ?Assisted Living Facility Prior Approval Number: ?  ? ?Date Approved/Denied: ?  PASRR Number: ?  ? ?Discharge Plan: ?Other (Comment) (ALF) ?  ? ?Current Diagnoses: ?Patient Active Problem List  ? Diagnosis Date Noted  ? Pressure injury of deep tissue of left heel 03/11/2022  ? Acute metabolic encephalopathy 11/94/1740  ? Acute lower UTI 03/09/2022  ? Thrombocytopenia ([place]) 03/09/2022  ? Elevated transaminase level 03/09/2022  ? Dyslipidemia   ? History of anemia due to chronic kidney disease   ? GERD (gastroesophageal reflux disease)   ? Fluid overload 03/06/2022  ? AKI superimposed on stage 3b chronic kidney disease ([place]) 01/02/2022  ? Symptomatic bradycardia 11/29/2021  ? Bacteremia due to Gram-positive bacteria 02/13/2020  ? Acute encephalopathy 02/11/2020  ? Cervical spine fracture ([place]) 02/11/2020  ? CAP (community acquired pneumonia) 02/11/2020  ? Chronic diastolic congestive heart failure ([place])   ? Stage 3 chronic kidney disease ([place])   ? PAF (paroxysmal atrial fibrillation) ([place])   ? Closed displaced intertrochanteric fracture of right femur ([place])   ? Fall 09/23/2019  ? SOB (shortness of breath) 01/04/2019  ? Bilateral lower leg cellulitis 03/30/2018  ? Acute on chronic diastolic CHF (congestive heart failure) ([place]) 03/30/2018  ? Edema of extremities 08/19/2014  ? Essential hypertension, benign 05/15/2014  ? Mixed hyperlipidemia 05/15/2014  ? Persistent atrial fibrillation ([place])  05/15/2014  ? Endometrial ca [facility]) 12/28/2012  ? ? ?Orientation RESPIRATION BLADDER Height & Weight   ?  ?Self, Time, Situation, Place ? Normal Continent Weight: 151 lb 7.3 oz (68.7 kg) ?Height:  '5\' 3"'$  (160 cm)  ?BEHAVIORAL SYMPTOMS/MOOD NEUROLOGICAL BOWEL NUTRITION STATUS  ?    Continent Diet (no added salt)  ?AMBULATORY STATUS COMMUNICATION OF NEEDS Skin   ?Limited Assist Verbally  (pressure injury:heel left deep tissue pressure injury, wound/incision nopressure wound pretibial left) ?  ?  ?  ?    ?     ?     ? ? ?Personal Care Assistance Level of Assistance  ?Bathing, Dressing, Feeding Bathing Assistance: Independent ?Feeding assistance: Limited assistance ?Dressing Assistance: Limited assistance ?   ? ?Functional Limitations Info  ?Sight, Hearing, Speech Sight Info: Impaired ?Hearing Info: Adequate ?Speech Info: Adequate  ? ? ?SPECIAL CARE FACTORS FREQUENCY  ?PT (By licensed PT), OT (By licensed OT)   ?  ?PT Frequency: 5x per week ?OT Frequency: 5x per week ?  ?  ?  ?   ? ? ?Contractures Contractures Info: Not present  ? ? ?Additional Factors Info  ?Code Status, Allergies Code Status Info: Full ?Allergies Info: Penicillins ?  ?  ?  ?   ? ?Current Medications (03/11/2022):  This is the current hospital active medication list ?Current Facility-Administered Medications  ?Medication Dose Route Frequency Provider Last Rate Last Admin  ? acetaminophen (TYLENOL) tablet 650 mg  650 mg Oral Q6H PRN Howerter, Justin B, DO      ? Or  ? acetaminophen (TYLENOL) suppository  650 mg  650 mg Rectal Q6H PRN Howerter, Justin B, DO      ? amiodarone (PACERONE) tablet 200 mg  200 mg Oral Daily Howerter, Justin B, DO   200 mg at 03/11/22 0851  ? apixaban (ELIQUIS) tablet 2.5 mg  2.5 mg Oral BID Howerter, Justin B, DO   2.5 mg at 03/11/22 0851  ? atorvastatin (LIPITOR) tablet 10 mg  10 mg Oral QHS Howerter, Justin B, DO   10 mg at 03/10/22 2228  ? docusate sodium (COLACE) capsule 100 mg  100 mg Oral Daily Little Ishikawa, MD   100  mg at 03/11/22 2979  ? hydrALAZINE (APRESOLINE) tablet 50 mg  50 mg Oral Q8H Howerter, Justin B, DO   50 mg at 03/11/22 8921  ? ondansetron (ZOFRAN) injection 4 mg  4 mg Intravenous Q6H PRN Howerter, Justin B, DO      ? pantoprazole (PROTONIX) EC tablet 40 mg  40 mg Oral Daily Howerter, Justin B, DO   40 mg at 03/11/22 0851  ? polyethylene glycol (MIRALAX / GLYCOLAX) packet 17 g  17 g Oral Daily PRN Little Ishikawa, MD      ? ?Current Outpatient Medications  ?Medication Sig Dispense Refill  ? acetaminophen (TYLENOL) 500 MG tablet Take 1,000 mg by mouth 3 (three) times daily as needed for moderate pain or mild pain.    ? albuterol (VENTOLIN HFA) 108 (90 Base) MCG/ACT inhaler Inhale 2 puffs into the lungs every 4 (four) hours as needed for wheezing or shortness of breath (please try to use ALbuterol inhaler PRIOR to Nebs). 90 g 0  ? amiodarone (PACERONE) 200 MG tablet Take 1 tablet (200 mg total) by mouth daily. 90 tablet 3  ? amLODipine (NORVASC) 5 MG tablet Take 1 tablet (5 mg total) by mouth daily. 30 tablet 6  ? apixaban (ELIQUIS) 2.5 MG TABS tablet Take 1 tablet (2.5 mg total) by mouth 2 (two) times daily. 180 tablet 3  ? atorvastatin (LIPITOR) 10 MG tablet Take 1 tablet (10 mg total) by mouth at bedtime. 90 tablet 3  ? Cholecalciferol (VITAMIN D) 125 MCG (5000 UT) CAPS Take 125 mcg by mouth daily.    ? docusate sodium (COLACE) 100 MG capsule Take 100 mg by mouth daily.    ? hydrALAZINE (APRESOLINE) 50 MG tablet Take 1 tablet (50 mg total) by mouth every 8 (eight) hours. 270 tablet 3  ? letrozole (FEMARA) 2.5 MG tablet Take 2.5 mg by mouth daily.    ? loperamide (IMODIUM A-D) 2 MG tablet Take 2 mg by mouth every 6 (six) hours as needed for diarrhea or loose stools. Do not exceed '8mg'$  in 24 hours.    ? Multiple Vitamin (DAILY VITE PO) Take 1 tablet by mouth daily.    ? NYSTATIN powder Apply 1 application. topically See admin instructions. Spread topically to area under bilateral breasts & groin twice a day at  8 AM and 8 PM    ? omeprazole (PRILOSEC) 20 MG capsule Take 20 mg by mouth daily.    ? polyethylene glycol (MIRALAX / GLYCOLAX) 17 g packet Take 17 g by mouth daily as needed (constipation).    ? torsemide (DEMADEX) 10 MG tablet Take 10 mg by mouth every Monday, Wednesday, and Friday.    ? torsemide (DEMADEX) 20 MG tablet Take 10 mg by mouth daily as needed (weight gain or 5lbs in 1 week or swelling). This is in conjunction with the daily Torsemide patient takes.    ?  vitamin B-12 (CYANOCOBALAMIN) 250 MCG tablet Take 250 mcg by mouth every other day.    ? vitamin C (VITAMIN C) 500 MG tablet Take 1 tablet (500 mg total) by mouth daily.    ? ? ? ?Discharge Medications: ?acetaminophen 500 MG tablet ?Commonly known as: TYLENOL ?Take 1,000 mg by mouth 3 (three) times daily as needed for moderate pain or mild pain. ?   ?albuterol 108 (90 Base) MCG/ACT inhaler ?Commonly known as: VENTOLIN HFA ?Inhale 2 puffs into the lungs every 4 (four) hours as needed for wheezing or shortness of breath (please try to use ALbuterol inhaler PRIOR to Nebs). ?   ?amiodarone 200 MG tablet ?Commonly known as: PACERONE ?Take 1 tablet (200 mg total) by mouth daily. ?   ?amLODipine 5 MG tablet ?Commonly known as: NORVASC ?Take 1 tablet (5 mg total) by mouth daily. ?   ?apixaban 2.5 MG Tabs tablet ?Commonly known as: ELIQUIS ?Take 1 tablet (2.5 mg total) by mouth 2 (two) times daily. ?   ?ascorbic acid 500 MG tablet ?Commonly known as: VITAMIN C ?Take 1 tablet (500 mg total) by mouth daily. ?   ?atorvastatin 10 MG tablet ?Commonly known as: LIPITOR ?Take 1 tablet (10 mg total) by mouth at bedtime. ?   ?DAILY VITE PO ?Take 1 tablet by mouth daily. ?   ?docusate sodium 100 MG capsule ?Commonly known as: COLACE ?Take 100 mg by mouth daily. ?   ?hydrALAZINE 50 MG tablet ?Commonly known as: APRESOLINE ?Take 1 tablet (50 mg total) by mouth every 8 (eight) hours. ?   ?letrozole 2.5 MG tablet ?Commonly known as: [place] ?Take 2.5 mg by mouth daily. ?    ?loperamide 2 MG tablet ?Commonly known as: IMODIUM A-D ?Take 2 mg by mouth every 6 (six) hours as needed for diarrhea or loose stools. Do not exceed '8mg'$  in 24 hours. ?   ?nystatin powder ?Generic drug:

## 2022-04-14 ENCOUNTER — Ambulatory Visit: Payer: Medicare Other | Admitting: Cardiology

## 2022-04-14 ENCOUNTER — Encounter: Payer: Self-pay | Admitting: Cardiology

## 2022-04-14 ENCOUNTER — Encounter: Payer: Self-pay | Admitting: *Deleted

## 2022-04-14 ENCOUNTER — Ambulatory Visit (INDEPENDENT_AMBULATORY_CARE_PROVIDER_SITE_OTHER): Payer: Medicare Other | Admitting: Cardiology

## 2022-04-14 VITALS — BP 128/56 | HR 82 | Ht 64.0 in | Wt 164.0 lb

## 2022-04-14 DIAGNOSIS — I4819 Other persistent atrial fibrillation: Secondary | ICD-10-CM

## 2022-04-14 DIAGNOSIS — D6869 Other thrombophilia: Secondary | ICD-10-CM

## 2022-04-14 NOTE — Progress Notes (Signed)
? ?Electrophysiology Office Note ? ? ?Date:  04/14/2022  ? ?ID:  Christina Morgan, DOB 07-Nov-1933, MRN [id number] ? ?PCP:  [facility]  ?Cardiologist:  Radford Pax ?Primary Electrophysiologist:  Travas Schexnayder Meredith Leeds, MD   ? ?Chief Complaint: AF ?  ?History of Present Illness: ?Christina Morgan is a 88 y.o. female who is being seen today for the evaluation of AF at the request of [facility] Presenting today for electrophysiology evaluation. ? ?She has a history seen for hypertension, hyperlipidemia, diastolic heart failure, CKD stage III, uterine cancer, atrial fibrillation.  She presented to the hospital January 2023 with atrial fibrillation.  She has a significant issue with bradycardia and thus she was switched from flecainide to amiodarone. ? ?Today, she denies symptoms of palpitations, chest pain, shortness of breath, orthopnea, PND, claudication, dizziness, presyncope, syncope, bleeding, or neurologic sequela. The patient is tolerating medications without difficulties.  He is unfortunately in atrial fibrillation today.  She does not have any complaints of weakness or fatigue.  She is unaware of her atrial fibrillation.  She does have lower extremity edema. ? ? ?Past Medical History:  ?Diagnosis Date  ? [facility])   ? endometrial ca  ? Chronic diastolic CHF (congestive heart failure) ([place])   ? Chronic edema   ? CKD (chronic kidney disease), stage III ([place])   ? Dyslipidemia   ? Fracture of left humerus   ? History of cardiovascular stress test   ? Lexiscan Myoview (06/2014): No ischemia or scar, not gated, low risk  ? Hx of echocardiogram   ? Echo (05/02/14): EF 60% to 65%. No regional wall motion abnormalities. Mild AI, mildly dilated aortic root (37 mm), trivial MR, trivial TR  ? Hyperlipidemia   ? Hypertension   ? Lipoma of skin   ? multiple  ? Osteopenia   ? PAF (paroxysmal atrial fibrillation) ([place])   ? failed DCCV 05/2014 >> Flecainide started >> DCCV 7/15 sucessful;  f/u ETT neg  for pro-arrhythmia >> recurrent AF/AFL >> Flecainide inc to 100 bid with repeat DCCV 9/15  ? Vitamin D deficiency 09/25/2019  ? ?Past Surgical History:  ?Procedure Laterality Date  ? CARDIOVERSION N/A 06/05/2014  ? Procedure: CARDIOVERSION;  Surgeon: Sueanne Margarita, MD;  Location: [place];  Service: Cardiovascular;  Laterality: N/A;  ? CARDIOVERSION N/A 07/11/2014  ? Procedure: CARDIOVERSION;  Surgeon: Sueanne Margarita, MD;  Location: [place];  Service: Cardiovascular;  Laterality: N/A;  ? CARDIOVERSION N/A 09/04/2014  ? Procedure: CARDIOVERSION;  Surgeon: Sueanne Margarita, MD;  Location: [facility] ENDOSCOPY;  Service: Cardiovascular;  Laterality: N/A;  ? COLONOSCOPY  2008  ? COLONOSCOPY N/A 05/20/2015  ? Procedure: COLONOSCOPY;  Surgeon: Ronald Lobo, MD;  Location: [facility] ENDOSCOPY;  Service: Endoscopy;  Laterality: N/A;  ? FEMUR IM NAIL Right 09/24/2019  ? Procedure: INTRAMEDULLARY (IM) NAIL FEMORAL;  Surgeon: Altamese Prince George, MD;  Location: [place];  Service: Orthopedics;  Laterality: Right;  ? IR KYPHO EA ADDL LEVEL THORACIC OR LUMBAR  04/12/2019  ? IR KYPHO THORACIC WITH BONE BIOPSY  04/12/2019  ? ROBOTIC ASSISTED SUPRACERVICAL HYSTERECTOMY WITH BILATERAL SALPINGO OOPHERECTOMY  11/06/2012  ? and bilateral pelvic lymph node dissection  ? ? ? ?Current Outpatient Medications  ?Medication Sig Dispense Refill  ? acetaminophen (TYLENOL) 500 MG tablet Take 1,000 mg by mouth 3 (three) times daily as needed for moderate pain or mild pain.    ? albuterol (VENTOLIN HFA) 108 (90 Base) MCG/ACT inhaler Inhale 2 puffs into the lungs every  4 (four) hours as needed for wheezing or shortness of breath (please try to use ALbuterol inhaler PRIOR to Nebs). 90 g 0  ? amiodarone (PACERONE) 200 MG tablet Take 1 tablet (200 mg total) by mouth daily. 90 tablet 3  ? amLODipine (NORVASC) 5 MG tablet Take 1 tablet (5 mg total) by mouth daily. 30 tablet 6  ? apixaban (ELIQUIS) 2.5 MG TABS tablet Take 1 tablet (2.5 mg total) by mouth 2 (two) times daily.  180 tablet 3  ? atorvastatin (LIPITOR) 10 MG tablet Take 1 tablet (10 mg total) by mouth at bedtime. 90 tablet 3  ? Cholecalciferol (VITAMIN D) 125 MCG (5000 UT) CAPS Take 125 mcg by mouth daily.    ? docusate sodium (COLACE) 100 MG capsule Take 100 mg by mouth daily.    ? hydrALAZINE (APRESOLINE) 50 MG tablet Take 1 tablet (50 mg total) by mouth every 8 (eight) hours. 270 tablet 3  ? letrozole (FEMARA) 2.5 MG tablet Take 2.5 mg by mouth daily.    ? loperamide (IMODIUM A-D) 2 MG tablet Take 2 mg by mouth every 6 (six) hours as needed for diarrhea or loose stools. Do not exceed '8mg'$  in 24 hours.    ? Multiple Vitamin (DAILY VITE PO) Take 1 tablet by mouth daily.    ? NYSTATIN powder Apply 1 application. topically See admin instructions. Spread topically to area under bilateral breasts & groin twice a day at 8 AM and 8 PM    ? omeprazole (PRILOSEC) 20 MG capsule Take 20 mg by mouth daily.    ? polyethylene glycol (MIRALAX / GLYCOLAX) 17 g packet Take 17 g by mouth daily as needed (constipation).    ? torsemide (DEMADEX) 10 MG tablet Take 10 mg by mouth every Monday, Wednesday, and Friday.    ? torsemide (DEMADEX) 20 MG tablet Take 10 mg by mouth daily as needed (weight gain or 5lbs in 1 week or swelling). This is in conjunction with the MWF Torsemide patient takes.    ? vitamin B-12 (CYANOCOBALAMIN) 250 MCG tablet Take 250 mcg by mouth every other day.    ? vitamin C (VITAMIN C) 500 MG tablet Take 1 tablet (500 mg total) by mouth daily.    ? ?No current facility-administered medications for this visit.  ? ? ?Allergies:   Penicillins  ? ?Social History:  The patient  reports that she has never smoked. She has been exposed to tobacco smoke. She has never used smokeless tobacco. She reports that she does not drink alcohol and does not use drugs.  ? ?Family History:  The patient's family history includes Cancer (age of onset: 50) in her father; Coronary artery disease in her mother; Heart attack in her mother.  ? ? ?ROS:   Please see the history of present illness.   Otherwise, review of systems is positive for none.   All other systems are reviewed and negative.  ? ? ?PHYSICAL EXAM: ?VS:  BP (!) 128/56   Pulse 82   Ht '5\' 4"'$  (1.626 m)   Wt 164 lb (74.4 kg)   SpO2 95%   BMI 28.15 kg/m?  , BMI Body mass index is 28.15 kg/m?. ?GEN: Well nourished, well developed, in no acute distress  ?HEENT: normal  ?Neck: no JVD, carotid bruits, or masses ?Cardiac: irregular; no murmurs, rubs, or gallops, 2+ edema  ?Respiratory:  clear to auscultation bilaterally, normal work of breathing ?GI: soft, nontender, nondistended, + BS ?MS: no deformity or atrophy  ?Skin:  warm and dry ?Neuro:  Strength and sensation are intact ?Psych: euthymic mood, full affect ? ?EKG:  EKG is ordered today. ?Personal review of the ekg ordered shows trill fibrillation, rate 82 ? ?Recent Labs: ?02/18/2022: NT-Pro BNP 264; TSH 10.400 ?03/06/2022: B Natriuretic Peptide 62.2 ?03/07/2022: Magnesium 1.8 ?03/10/2022: Hemoglobin 11.0; Platelets 130 ?03/11/2022: ALT 31; BUN 26; Creatinine, Ser 2.33; Potassium 3.2; Sodium 142  ? ? ?Lipid Panel  ?   ?Component Value Date/Time  ? CHOL 175 03/21/2016 0915  ? TRIG 155 (H) 03/21/2016 0915  ? HDL 63 03/21/2016 0915  ? CHOLHDL 2.8 03/21/2016 0915  ? VLDL 31 (H) 03/21/2016 0915  ? Leonore 81 03/21/2016 0915  ? ? ? ?Wt Readings from Last 3 Encounters:  ?04/14/22 164 lb (74.4 kg)  ?03/10/22 151 lb 7.3 oz (68.7 kg)  ?02/18/22 174 lb (78.9 kg)  ?  ? ? ?Other studies Reviewed: ?Additional studies/ records that were reviewed today include: TTE 01/03/22  ?Review of the above records today demonstrates:  ? 1. Abnormal septal motion. Left ventricular ejection fraction, by  ?estimation, is 55 to 60%. The left ventricle has normal function. The left  ?ventricle has no regional wall motion abnormalities. There is mild left  ?ventricular hypertrophy. Left  ?ventricular diastolic parameters are indeterminate.  ? 2. Right ventricular systolic function is  moderately reduced. The right  ?ventricular size is moderately enlarged. There is moderately elevated  ?pulmonary artery systolic pressure.  ? 3. The mitral valve is degenerative. Trivial mitral valve regurgita

## 2022-04-14 NOTE — Patient Instructions (Signed)
Medication Instructions:  ?Your physician recommends that you continue on your current medications as directed. Please refer to the Current Medication list given to you today. ? ?*If you need a refill on your cardiac medications before your next appointment, please call your pharmacy* ? ? ?Lab Work: ?None ordered ?If you have labs (blood work) drawn today and your tests are completely normal, you will receive your results only by: ?MyChart Message (if you have MyChart) OR ?A paper copy in the mail ?If you have any lab test that is abnormal or we need to change your treatment, we will call you to review the results. ? ? ?Testing/Procedures: ?Your physician has recommended that you have a Cardioversion (DCCV). Electrical Cardioversion uses a jolt of electricity to your heart either through paddles or wired patches attached to your chest. This is a controlled, usually prescheduled, procedure. Defibrillation is done under light anesthesia in the hospital, and you usually go home the day of the procedure. This is done to get your heart back into a normal rhythm. You are not awake for the procedure. Please see the instruction sheet given to you today. ? ? ?Follow-Up: ?At [facility], you and your health needs are our priority.  As part of our continuing mission to provide you with exceptional heart care, we have created designated Provider Care Teams.  These Care Teams include your primary Cardiologist (physician) and Advanced Practice Providers (APPs -  Physician Assistants and Nurse Practitioners) who all work together to provide you with the care you need, when you need it.  ? ?Your next appointment:   ?1 month(s) ? ?The format for your next appointment:   ?In Person ? ?Provider:   ?You will follow up in the [facility] located at [facility]. ?Your provider will be: ?Roderic Palau, NP or Clint R. Fenton, PA-C  ? ? ?Thank you for choosing [facility]!! ? ? ?[name], RN ?[phone] ? ?Other Instructions ? ?Important Information About Sugar ? ? ? ? ? ?Electrical Cardioversion ?Electrical cardioversion is the delivery of a jolt of electricity to restore a normal rhythm to the heart. A rhythm that is too fast or is not regular keeps the heart from pumping well. In this procedure, sticky patches or metal paddles are placed on the chest to deliver electricity to the heart from a device. ?This procedure may be done in an emergency if: ?There is low or no blood pressure as a result of the heart rhythm. ?Normal rhythm must be restored as fast as possible to protect the brain and heart from further damage. ?It may save a life. ?This may also be a scheduled procedure for irregular or fast heart rhythms that are not immediately life-threatening. ?Tell a health care provider about: ?Any allergies you have. ?All medicines you are taking, including vitamins, herbs, eye drops, creams, and over-the-counter medicines. ?Any problems you or family members have had with anesthetic medicines. ?Any blood disorders you have. ?Any surgeries you have had. ?Any medical conditions you have. ?Whether you are pregnant or may be pregnant. ?What are the risks? ?Generally, this is a safe procedure. However, problems may occur, including: ?Allergic reactions to medicines. ?A blood clot that breaks free and travels to other parts of your body. ?The possible return of an abnormal heart rhythm within hours or days after the procedure. ?Your heart stopping (cardiac arrest). This is rare. ?What happens before the procedure? ?Medicines ?Your health care provider may have you start taking: ?Blood-thinning medicines (  anticoagulants) so your blood does not clot as easily. ?Medicines to help stabilize your heart rate and rhythm. ?Ask your health care provider about: ?Changing or stopping your regular medicines. This is especially important if you are taking diabetes medicines or blood thinners. ?Taking medicines such as  aspirin and ibuprofen. These medicines can thin your blood. Do not take these medicines unless your health care provider tells you to take them. ?Taking over-the-counter medicines, vitamins, herbs, and supplements. ?General instructions ?Follow instructions from your health care provider about eating or drinking restrictions. ?Plan to have someone take you home from the hospital or clinic. ?If you will be going home right after the procedure, plan to have someone with you for 24 hours. ?Ask your health care provider what steps will be taken to help prevent infection. These may include washing your skin with a germ-killing soap. ?What happens during the procedure? ? ?An IV will be inserted into one of your veins. ?Sticky patches (electrodes) or metal paddles may be placed on your chest. ?You will be given a medicine to help you relax (sedative). ?An electrical shock will be delivered. ?The procedure may vary among health care providers and hospitals. ?What can I expect after the procedure? ?Your blood pressure, heart rate, breathing rate, and blood oxygen level will be monitored until you leave the hospital or clinic. ?Your heart rhythm will be watched to make sure it does not change. ?You may have some redness on the skin where the shocks were given. ?Follow these instructions at home: ?Do not drive for 24 hours if you were given a sedative during your procedure. ?Take over-the-counter and prescription medicines only as told by your health care provider. ?Ask your health care provider how to check your pulse. Check it often. ?Rest for 48 hours after the procedure or as told by your health care provider. ?Avoid or limit your caffeine use as told by your health care provider. ?Keep all follow-up visits as told by your health care provider. This is important. ?Contact a health care provider if: ?You feel like your heart is beating too quickly or your pulse is not regular. ?You have a serious muscle cramp that does not  go away. ?Get help right away if: ?You have discomfort in your chest. ?You are dizzy or you feel faint. ?You have trouble breathing or you are short of breath. ?Your speech is slurred. ?You have trouble moving an arm or leg on one side of your body. ?Your fingers or toes turn cold or blue. ?Summary ?Electrical cardioversion is the delivery of a jolt of electricity to restore a normal rhythm to the heart. ?This procedure may be done right away in an emergency or may be a scheduled procedure if the condition is not an emergency. ?Generally, this is a safe procedure. ?After the procedure, check your pulse often as told by your health care provider. ?This information is not intended to replace advice given to you by your health care provider. Make sure you discuss any questions you have with your health care provider. ?Document Revised: 10/28/2021 Document Reviewed: 07/01/2019 ?Elsevier Patient Education ? [place]. ? ? ? ?  ?

## 2022-04-14 NOTE — H&P (View-Only) (Signed)
Electrophysiology Office Note   Date:  04/14/2022   ID:  Bellamie, Turney [birth year], MRN [id number]  PCP:  Orvis Brill, [facility]  Cardiologist:  Radford Pax Primary Electrophysiologist:  Cleophus Mendonsa Meredith Leeds, MD    Chief Complaint: AF   History of Present Illness: Christina Morgan is a 88 y.o. female who is being seen today for the evaluation of AF at the request of [facility], [facility] Presenting today for electrophysiology evaluation.  She has a history seen for hypertension, hyperlipidemia, diastolic heart failure, CKD stage III, uterine cancer, atrial fibrillation.  She presented to the hospital January 2023 with atrial fibrillation.  She has a significant issue with bradycardia and thus she was switched from flecainide to amiodarone.  Today, she denies symptoms of palpitations, chest pain, shortness of breath, orthopnea, PND, claudication, dizziness, presyncope, syncope, bleeding, or neurologic sequela. The patient is tolerating medications without difficulties.  He is unfortunately in atrial fibrillation today.  She does not have any complaints of weakness or fatigue.  She is unaware of her atrial fibrillation.  She does have lower extremity edema.   Past Medical History:  Diagnosis Date   Cancer [facility])    endometrial ca   Chronic diastolic CHF (congestive heart failure) (HCC)    Chronic edema    CKD (chronic kidney disease), stage III (HCC)    Dyslipidemia    Fracture of left humerus    History of cardiovascular stress test    Lexiscan Myoview (06/2014): No ischemia or scar, not gated, low risk   Hx of echocardiogram    Echo (05/02/14): EF 60% to 65%. No regional wall motion abnormalities. Mild AI, mildly dilated aortic root (37 mm), trivial MR, trivial TR   Hyperlipidemia    Hypertension    Lipoma of skin    multiple   Osteopenia    PAF (paroxysmal atrial fibrillation) (HCC)    failed DCCV 05/2014 >> Flecainide started >> DCCV 7/15 sucessful;  f/u ETT neg  for pro-arrhythmia >> recurrent AF/AFL >> Flecainide inc to 100 bid with repeat DCCV 9/15   Vitamin D deficiency 09/25/2019   Past Surgical History:  Procedure Laterality Date   CARDIOVERSION N/A 06/05/2014   Procedure: CARDIOVERSION;  Surgeon: Sueanne Margarita, MD;  Location: [place];  Service: Cardiovascular;  Laterality: N/A;   CARDIOVERSION N/A 07/11/2014   Procedure: CARDIOVERSION;  Surgeon: Sueanne Margarita, MD;  Location: [place];  Service: Cardiovascular;  Laterality: N/A;   CARDIOVERSION N/A 09/04/2014   Procedure: CARDIOVERSION;  Surgeon: Sueanne Margarita, MD;  Location: [place] [facility];  Service: Cardiovascular;  Laterality: N/A;   COLONOSCOPY  2008   COLONOSCOPY N/A 05/20/2015   Procedure: COLONOSCOPY;  Surgeon: Ronald Lobo, MD;  Location: [facility];  Service: Endoscopy;  Laterality: N/A;   FEMUR IM NAIL Right 09/24/2019   Procedure: INTRAMEDULLARY (IM) NAIL FEMORAL;  Surgeon: Altamese Lakeview, MD;  Location: [place];  Service: Orthopedics;  Laterality: Right;   IR KYPHO EA ADDL LEVEL THORACIC OR LUMBAR  04/12/2019   IR KYPHO THORACIC WITH BONE BIOPSY  04/12/2019   ROBOTIC ASSISTED SUPRACERVICAL HYSTERECTOMY WITH BILATERAL SALPINGO OOPHERECTOMY  11/06/2012   and bilateral pelvic lymph node dissection     Current Outpatient Medications  Medication Sig Dispense Refill   acetaminophen (TYLENOL) 500 MG tablet Take 1,000 mg by mouth 3 (three) times daily as needed for moderate pain or mild pain.     albuterol (VENTOLIN HFA) 108 (90 Base) MCG/ACT inhaler Inhale 2 puffs into the lungs every  4 (four) hours as needed for wheezing or shortness of breath (please try to use ALbuterol inhaler PRIOR to Nebs). 90 g 0   amiodarone (PACERONE) 200 MG tablet Take 1 tablet (200 mg total) by mouth daily. 90 tablet 3   amLODipine (NORVASC) 5 MG tablet Take 1 tablet (5 mg total) by mouth daily. 30 tablet 6   apixaban (ELIQUIS) 2.5 MG TABS tablet Take 1 tablet (2.5 mg total) by mouth 2 (two) times daily.  180 tablet 3   atorvastatin (LIPITOR) 10 MG tablet Take 1 tablet (10 mg total) by mouth at bedtime. 90 tablet 3   Cholecalciferol (VITAMIN D) 125 MCG (5000 UT) CAPS Take 125 mcg by mouth daily.     docusate sodium (COLACE) 100 MG capsule Take 100 mg by mouth daily.     hydrALAZINE (APRESOLINE) 50 MG tablet Take 1 tablet (50 mg total) by mouth every 8 (eight) hours. 270 tablet 3   letrozole (FEMARA) 2.5 MG tablet Take 2.5 mg by mouth daily.     loperamide (IMODIUM A-D) 2 MG tablet Take 2 mg by mouth every 6 (six) hours as needed for diarrhea or loose stools. Do not exceed '8mg'$  in 24 hours.     Multiple Vitamin (DAILY VITE PO) Take 1 tablet by mouth daily.     NYSTATIN powder Apply 1 application. topically See admin instructions. Spread topically to area under bilateral breasts & groin twice a day at 8 AM and 8 PM     omeprazole (PRILOSEC) 20 MG capsule Take 20 mg by mouth daily.     polyethylene glycol (MIRALAX / GLYCOLAX) 17 g packet Take 17 g by mouth daily as needed (constipation).     torsemide (DEMADEX) 10 MG tablet Take 10 mg by mouth every Monday, Wednesday, and Friday.     torsemide (DEMADEX) 20 MG tablet Take 10 mg by mouth daily as needed (weight gain or 5lbs in 1 week or swelling). This is in conjunction with the MWF Torsemide patient takes.     vitamin B-12 (CYANOCOBALAMIN) 250 MCG tablet Take 250 mcg by mouth every other day.     vitamin C (VITAMIN C) 500 MG tablet Take 1 tablet (500 mg total) by mouth daily.     No current facility-administered medications for this visit.    Allergies:   Penicillins   Social History:  The patient  reports that she has never smoked. She has been exposed to tobacco smoke. She has never used smokeless tobacco. She reports that she does not drink alcohol and does not use drugs.   Family History:  The patient's family history includes Cancer (age of onset: 70) in her father; Coronary artery disease in her mother; Heart attack in her mother.    ROS:   Please see the history of present illness.   Otherwise, review of systems is positive for none.   All other systems are reviewed and negative.    PHYSICAL EXAM: VS:  BP (!) 128/56   Pulse 82   Ht '5\' 4"'$  (1.626 m)   Wt 164 lb (74.4 kg)   SpO2 95%   BMI 28.15 kg/m  , BMI Body mass index is 28.15 kg/m. GEN: Well nourished, well developed, in no acute distress  HEENT: normal  Neck: no JVD, carotid bruits, or masses Cardiac: irregular; no murmurs, rubs, or gallops, 2+ edema  Respiratory:  clear to auscultation bilaterally, normal work of breathing GI: soft, nontender, nondistended, + BS MS: no deformity or atrophy  Skin:  warm and dry Neuro:  Strength and sensation are intact Psych: euthymic mood, full affect  EKG:  EKG is ordered today. Personal review of the ekg ordered shows trill fibrillation, rate 82  Recent Labs: 02/18/2022: NT-Pro BNP 264; TSH 10.400 03/06/2022: B Natriuretic Peptide 62.2 03/07/2022: Magnesium 1.8 03/10/2022: Hemoglobin 11.0; Platelets 130 03/11/2022: ALT 31; BUN 26; Creatinine, Ser 2.33; Potassium 3.2; Sodium 142    Lipid Panel     Component Value Date/Time   CHOL 175 03/21/2016 0915   TRIG 155 (H) 03/21/2016 0915   HDL 63 03/21/2016 0915   CHOLHDL 2.8 03/21/2016 0915   VLDL 31 (H) 03/21/2016 0915   LDLCALC 81 03/21/2016 0915     Wt Readings from Last 3 Encounters:  04/14/22 164 lb (74.4 kg)  03/10/22 151 lb 7.3 oz (68.7 kg)  02/18/22 174 lb (78.9 kg)      Other studies Reviewed: Additional studies/ records that were reviewed today include: TTE 01/03/22  Review of the above records today demonstrates:   1. Abnormal septal motion. Left ventricular ejection fraction, by  estimation, is 55 to 60%. The left ventricle has normal function. The left  ventricle has no regional wall motion abnormalities. There is mild left  ventricular hypertrophy. Left  ventricular diastolic parameters are indeterminate.   2. Right ventricular systolic function is  moderately reduced. The right  ventricular size is moderately enlarged. There is moderately elevated  pulmonary artery systolic pressure.   3. The mitral valve is degenerative. Trivial mitral valve regurgitation.  No evidence of mitral stenosis.   4. AV severely thickened and calcified now with mild stenosis compared to  TTE done 02/12/20 There contiues to also be some shadowing artifact in LVOT  doubt vegetation and was present on TTE 2021 . The aortic valve is  tricuspid. There is severe  calcifcation of the aortic valve. There is severe thickening of the aortic  valve. Aortic valve regurgitation is mild to moderate. Mild aortic valve  stenosis.   5. Aortic dilatation noted. There is mild dilatation of the ascending  aorta, measuring 39 mm.   6. The inferior vena cava is normal in size with greater than 50%  respiratory variability, suggesting right atrial pressure of 3 mmHg.    ASSESSMENT AND PLAN:  1.  Persistent atrial fibrillation: Currently on amiodarone 200 mg daily, Eliquis 2.5 mg twice daily.  Occasional monitoring for amiodarone.  CHA2DS2-VASc of at least 4.  Unfortunately she is in atrial fibrillation today.  She does have lower extremity edema that could be exacerbated by her atrial fibrillation.  Due to that, we Billal Rollo plan for cardioversion.  If she goes back into atrial fibrillation quickly, would stop amiodarone and plan for a rate control strategy.  2.  Chronic diastolic heart failure: Has chronic lower extremity edema.  Volume management per primary cardiology.  3.  Hypertension: Currently well controlled  4.  Secondary hypercoagulable state: Currently on Eliquis for atrial fibrillation as above.  Current medicines are reviewed at length with the patient today.   The patient does not have concerns regarding her medicines.  The following changes were made today:  none  Labs/ tests ordered today include:  Orders Placed This Encounter  Procedures   EKG 12-Lead      Disposition:   [facility] atrial fibrillation clinic 1 month  Signed, Burgundy Matuszak Meredith Leeds, MD  04/14/2022 4:51 PM     [place] [address] [place] [address] [phone] (office) [phone] (fax)

## 2022-04-26 ENCOUNTER — Telehealth: Payer: Self-pay | Admitting: Cardiology

## 2022-04-26 NOTE — Telephone Encounter (Signed)
Spoke to [employer] [facility] about upcoming DCCV instructions. ?Informed that we did receive the fax yesterday. ?Updated paperwork as requested, specific instructions added as requested. ?Faxed back to [facility] ? ?

## 2022-04-26 NOTE — Telephone Encounter (Signed)
?  Pt c/o medication issue: ? ?1. Name of Medication: apixaban (ELIQUIS) 2.5 MG TABS tablet ? ?2. How are you currently taking this medication (dosage and times per day)? Take 1 tablet (2.5 mg total) by mouth 2 (two) times daily. ? ?3. Are you having a reaction (difficulty breathing--STAT)?  ? ?4. What is your medication issue?  ? ?Christina Morgan is they wanted to get clarification on how long pt needs to held meds prior her cardioversion. She said she faxed a paperwork last 05/10 and today if someone cn confirm that Dr. Curt Bears got it ?

## 2022-04-27 ENCOUNTER — Encounter (HOSPITAL_COMMUNITY): Payer: Self-pay | Admitting: Cardiology

## 2022-05-04 ENCOUNTER — Encounter (HOSPITAL_COMMUNITY): Payer: Self-pay | Admitting: Cardiology

## 2022-05-04 ENCOUNTER — Ambulatory Visit (HOSPITAL_BASED_OUTPATIENT_CLINIC_OR_DEPARTMENT_OTHER): Payer: Medicare Other | Admitting: Anesthesiology

## 2022-05-04 ENCOUNTER — Ambulatory Visit (HOSPITAL_COMMUNITY)
Admission: RE | Admit: 2022-05-04 | Discharge: 2022-05-04 | Disposition: A | Discharge status: Skilled Nursing Facility | Payer: Medicare Other | Attending: Cardiology | Admitting: Cardiology

## 2022-05-04 ENCOUNTER — Encounter (HOSPITAL_COMMUNITY)
Admission: RE | Disposition: A | Discharge status: Skilled Nursing Facility | Payer: Self-pay | Source: Home / Self Care | Attending: Cardiology

## 2022-05-04 ENCOUNTER — Other Ambulatory Visit: Payer: Self-pay

## 2022-05-04 ENCOUNTER — Ambulatory Visit (HOSPITAL_COMMUNITY): Payer: Medicare Other | Admitting: Anesthesiology

## 2022-05-04 DIAGNOSIS — I4819 Other persistent atrial fibrillation: Secondary | ICD-10-CM | POA: Diagnosis present

## 2022-05-04 DIAGNOSIS — I130 Hypertensive heart and chronic kidney disease with heart failure and stage 1 through stage 4 chronic kidney disease, or unspecified chronic kidney disease: Secondary | ICD-10-CM | POA: Diagnosis not present

## 2022-05-04 DIAGNOSIS — I5032 Chronic diastolic (congestive) heart failure: Secondary | ICD-10-CM | POA: Diagnosis not present

## 2022-05-04 DIAGNOSIS — I4891 Unspecified atrial fibrillation: Secondary | ICD-10-CM | POA: Diagnosis not present

## 2022-05-04 DIAGNOSIS — Z8542 Personal history of malignant neoplasm of other parts of uterus: Secondary | ICD-10-CM | POA: Diagnosis not present

## 2022-05-04 DIAGNOSIS — J449 Chronic obstructive pulmonary disease, unspecified: Secondary | ICD-10-CM

## 2022-05-04 DIAGNOSIS — I10 Essential (primary) hypertension: Secondary | ICD-10-CM

## 2022-05-04 DIAGNOSIS — Z79899 Other long term (current) drug therapy: Secondary | ICD-10-CM | POA: Insufficient documentation

## 2022-05-04 DIAGNOSIS — Z7901 Long term (current) use of anticoagulants: Secondary | ICD-10-CM | POA: Diagnosis not present

## 2022-05-04 DIAGNOSIS — N183 Chronic kidney disease, stage 3 unspecified: Secondary | ICD-10-CM | POA: Insufficient documentation

## 2022-05-04 DIAGNOSIS — D6869 Other thrombophilia: Secondary | ICD-10-CM | POA: Diagnosis not present

## 2022-05-04 DIAGNOSIS — I480 Paroxysmal atrial fibrillation: Secondary | ICD-10-CM

## 2022-05-04 DIAGNOSIS — E785 Hyperlipidemia, unspecified: Secondary | ICD-10-CM | POA: Diagnosis not present

## 2022-05-04 DIAGNOSIS — N289 Disorder of kidney and ureter, unspecified: Secondary | ICD-10-CM

## 2022-05-04 HISTORY — PX: CARDIOVERSION: SHX1299

## 2022-05-04 LAB — POCT I-STAT, CHEM 8
BUN: 22 mg/dL (ref 8–23)
Calcium, Ion: 1.21 mmol/L (ref 1.15–1.40)
Chloride: 106 mmol/L (ref 98–111)
Creatinine, Ser: 1.8 mg/dL — ABNORMAL HIGH (ref 0.44–1.00)
Glucose, Bld: 88 mg/dL (ref 70–99)
HCT: 32 % — ABNORMAL LOW (ref 36.0–46.0)
Hemoglobin: 10.9 g/dL — ABNORMAL LOW (ref 12.0–15.0)
Potassium: 3.7 mmol/L (ref 3.5–5.1)
Sodium: 143 mmol/L (ref 135–145)
TCO2: 28 mmol/L (ref 22–32)

## 2022-05-04 SURGERY — CARDIOVERSION
Anesthesia: General

## 2022-05-04 MED ORDER — LIDOCAINE 2% (20 MG/ML) 5 ML SYRINGE
INTRAMUSCULAR | Status: DC | PRN
  Administered 2022-05-04: 40 mg via INTRAVENOUS

## 2022-05-04 MED ORDER — SODIUM CHLORIDE 0.9 % IV SOLN: INTRAVENOUS | Status: DC

## 2022-05-04 MED ORDER — PROPOFOL 10 MG/ML IV BOLUS
INTRAVENOUS | Status: DC | PRN
  Administered 2022-05-04: 60 mg via INTRAVENOUS

## 2022-05-04 NOTE — Anesthesia Postprocedure Evaluation (Signed)
Anesthesia Post Note  Patient: Christina Morgan  Procedure(s) Performed: CARDIOVERSION     Patient location during evaluation: Endoscopy Anesthesia Type: General Level of consciousness: awake and alert, patient cooperative and oriented Pain management: pain level controlled Vital Signs Assessment: post-procedure vital signs reviewed and stable Respiratory status: nonlabored ventilation, spontaneous breathing and respiratory function stable Cardiovascular status: blood pressure returned to baseline and stable Postop Assessment: no apparent nausea or vomiting Anesthetic complications: no   No notable events documented.  Last Vitals:  Vitals:   05/04/22 1048 05/04/22 1058  BP: (!) 130/50 (!) 126/46  Pulse: 66 66  Resp: 12 13  Temp: (!) 36.3 C   SpO2: 96% 96%    Last Pain:  Vitals:   05/04/22 1058  TempSrc:   PainSc: 0-No pain                 [name],E. Grier Czerwinski

## 2022-05-04 NOTE — CV Procedure (Signed)
Procedure:   DCCV  Indication:  Symptomatic atrial fibrillation  Procedure Note:  The patient signed informed consent.  They have had had therapeutic anticoagulation with Eliquis greater than 3 weeks.  Anesthesia was administered by Dr. Glennon Mac and Laban Emperor, CRNA.  Adequate airway was maintained throughout and vital followed per protocol.  They were cardioverted x 1 with 200J of biphasic synchronized energy.  They converted to NSR with PACs, rate 60s.  There were no apparent complications.  The patient had normal neuro status and respiratory status post procedure with vitals stable as recorded elsewhere.    Follow up:  They will continue on current medical therapy and follow up with cardiology as scheduled.  Oswaldo Milian, MD 05/04/2022 10:45 AM

## 2022-05-04 NOTE — Anesthesia Preprocedure Evaluation (Addendum)
Anesthesia Evaluation  Patient identified by MRN, date of birth, ID band Patient awake    Reviewed: Allergy & Precautions, NPO status , Patient's Chart, lab work & pertinent test results  History of Anesthesia Complications Negative for: history of anesthetic complications  Airway Mallampati: II  TM Distance: >3 FB Neck ROM: Full    Dental  (+) Dental Advisory Given   Pulmonary COPD,  COPD inhaler,    breath sounds clear to auscultation       Cardiovascular hypertension, Pt. on medications + dysrhythmias Atrial Fibrillation  Rhythm:Irregular Rate:Normal  12/2021 ECHO: EF 55 to 60%. LV has normal function, no regional wall motion abnormalities. There is mild LVH. RV function is moderately reduced, RV size is moderately enlarged. There is moderately elevated pulmonary artery systolic pressure. MV is degenerative. Trivial MR.  No evidence of mitral stenosis. AV severely thickened and calcified now with mild AS    Neuro/Psych negative neurological ROS  negative psych ROS   GI/Hepatic Neg liver ROS, GERD  Controlled and Medicated,  Endo/Other  negative endocrine ROS  Renal/GU Renal InsufficiencyRenal disease     Musculoskeletal   Abdominal   Peds  Hematology eliquis   Anesthesia Other Findings   Reproductive/Obstetrics                            Anesthesia Physical Anesthesia Plan  ASA: 3  Anesthesia Plan: General   Post-op Pain Management: Minimal or no pain anticipated   Induction:   PONV Risk Score and Plan: 3 and Treatment may vary due to age or medical condition  Airway Management Planned: Natural Airway and Mask  Additional Equipment: None  Intra-op Plan:   Post-operative Plan:   Informed Consent: I have reviewed the patients History and Physical, chart, labs and discussed the procedure including the risks, benefits and alternatives for the proposed anesthesia with the patient  or authorized representative who has indicated his/her understanding and acceptance.     Dental advisory given  Plan Discussed with: CRNA and Surgeon  Anesthesia Plan Comments:         Anesthesia Quick Evaluation

## 2022-05-04 NOTE — Interval H&P Note (Signed)
History and Physical Interval Note:  05/04/2022 10:23 AM  Christina Morgan  has presented today for surgery, with the diagnosis of AFIB.  The various methods of treatment have been discussed with the patient and family. After consideration of risks, benefits and other options for treatment, the patient has consented to  Procedure(s): CARDIOVERSION (N/A) as a surgical intervention.  The patient's history has been reviewed, patient examined, no change in status, stable for surgery.  I have reviewed the patient's chart and labs.  Questions were answered to the patient's satisfaction.     Donato Heinz

## 2022-05-04 NOTE — Transfer of Care (Signed)
Immediate Anesthesia Transfer of Care Note  Patient: Christina Morgan  Procedure(s) Performed: CARDIOVERSION  Patient Location: Endoscopy Unit  Anesthesia Type:General  Level of Consciousness: awake and drowsy  Airway & Oxygen Therapy: Patient Spontanous Breathing  Post-op Assessment: Report given to RN and Post -op Vital signs reviewed and stable  Post vital signs: Reviewed and stable  Last Vitals:  Vitals Value Taken Time  BP 130/50 (78)   Temp    Pulse 72   Resp 14   SpO2 97     Last Pain:  Vitals:   05/04/22 0949  TempSrc: Temporal  PainSc: 0-No pain         Complications: No notable events documented.

## 2022-05-04 NOTE — Anesthesia Procedure Notes (Signed)
Procedure Name: General with mask airway Date/Time: 05/04/2022 10:28 AM Performed by: Dorann Lodge, CRNA Pre-anesthesia Checklist: Patient identified, Emergency Drugs available, Suction available and Patient being monitored Patient Re-evaluated:Patient Re-evaluated prior to induction Oxygen Delivery Method: Ambu bag Preoxygenation: Pre-oxygenation with 100% oxygen Induction Type: IV induction

## 2022-05-04 NOTE — Discharge Instructions (Signed)

## 2022-05-05 ENCOUNTER — Encounter (HOSPITAL_COMMUNITY): Payer: Self-pay | Admitting: Cardiology

## 2022-05-23 ENCOUNTER — Ambulatory Visit: Payer: Medicare Other | Admitting: Cardiology

## 2022-05-26 ENCOUNTER — Ambulatory Visit (HOSPITAL_COMMUNITY)
Admission: RE | Admit: 2022-05-26 | Discharge: 2022-05-26 | Disposition: A | Discharge status: Home / Self Care | Payer: Medicare Other | Source: Ambulatory Visit | Attending: Nurse Practitioner | Admitting: Nurse Practitioner

## 2022-05-26 VITALS — BP 170/76 | HR 85 | Ht 64.0 in | Wt 165.0 lb

## 2022-05-26 DIAGNOSIS — I480 Paroxysmal atrial fibrillation: Secondary | ICD-10-CM

## 2022-05-26 DIAGNOSIS — I4891 Unspecified atrial fibrillation: Secondary | ICD-10-CM | POA: Diagnosis not present

## 2022-05-26 DIAGNOSIS — D6869 Other thrombophilia: Secondary | ICD-10-CM

## 2022-05-26 DIAGNOSIS — R609 Edema, unspecified: Secondary | ICD-10-CM | POA: Insufficient documentation

## 2022-05-26 DIAGNOSIS — I4892 Unspecified atrial flutter: Secondary | ICD-10-CM | POA: Insufficient documentation

## 2022-05-26 DIAGNOSIS — Z7901 Long term (current) use of anticoagulants: Secondary | ICD-10-CM | POA: Diagnosis not present

## 2022-05-26 DIAGNOSIS — I110 Hypertensive heart disease with heart failure: Secondary | ICD-10-CM | POA: Diagnosis not present

## 2022-05-26 DIAGNOSIS — I5030 Unspecified diastolic (congestive) heart failure: Secondary | ICD-10-CM | POA: Diagnosis not present

## 2022-05-26 NOTE — Progress Notes (Addendum)
Primary Care Physician: [facility] Referring Physician: Dr. Margaretha Sheffield Christina Morgan is a 88 y.o. female with a h/o  CHF, afib on amiodarone that is in the afib clinic to f/u cardioversion. She was started on amiodarone earlier in the year after  being in the hospital afib /volume overload. She  also has a h/o of bradycardia with atenolol and clonidine being stopped in the past.   She was seen by Dr. Curt Bears in May for f/u and was back in afib. He arranged for cardioversion which was successful 5/24, but unfortunately now in afib clinic f/u cardioversion, she is in rate controlled atrial flutter. Pt does not feel any different in afib than in SR and could not appreciate feeling any better in SR right after procedure.   Per Dr. Curt Bears note, if CV was not successful to maintain SR, then rate control would be her afib strategy  going forward and amiodarone would be stopped.   Today, she denies symptoms of palpitations, chest pain, shortness of breath, orthopnea, PND, lower extremity edema, dizziness, presyncope, syncope, or neurologic sequela. The patient is tolerating medications without difficulties and is otherwise without complaint today.   Past Medical History:  Diagnosis Date   Cancer [facility])    endometrial ca   Chronic diastolic CHF (congestive heart failure) (HCC)    Chronic edema    CKD (chronic kidney disease), stage III (HCC)    Dyslipidemia    Fracture of left humerus    History of cardiovascular stress test    Lexiscan Myoview (06/2014): No ischemia or scar, not gated, low risk   Hx of echocardiogram    Echo (05/02/14): EF 60% to 65%. No regional wall motion abnormalities. Mild AI, mildly dilated aortic root (37 mm), trivial MR, trivial TR   Hyperlipidemia    Hypertension    Lipoma of skin    multiple   Osteopenia    PAF (paroxysmal atrial fibrillation) (HCC)    failed DCCV 05/2014 >> Flecainide started >> DCCV 7/15 sucessful;  f/u ETT neg for  pro-arrhythmia >> recurrent AF/AFL >> Flecainide inc to 100 bid with repeat DCCV 9/15   Vitamin D deficiency 09/25/2019   Past Surgical History:  Procedure Laterality Date   CARDIOVERSION N/A 06/05/2014   Procedure: CARDIOVERSION;  Surgeon: Sueanne Margarita, MD;  Location: [place];  Service: Cardiovascular;  Laterality: N/A;   CARDIOVERSION N/A 07/11/2014   Procedure: CARDIOVERSION;  Surgeon: Sueanne Margarita, MD;  Location: [place];  Service: Cardiovascular;  Laterality: N/A;   CARDIOVERSION N/A 09/04/2014   Procedure: CARDIOVERSION;  Surgeon: Sueanne Margarita, MD;  Location: [place] ENDOSCOPY;  Service: Cardiovascular;  Laterality: N/A;   CARDIOVERSION N/A 05/04/2022   Procedure: CARDIOVERSION;  Surgeon: Donato Heinz, MD;  Location: [facility] ENDOSCOPY;  Service: Cardiovascular;  Laterality: N/A;   COLONOSCOPY  2008   COLONOSCOPY N/A 05/20/2015   Procedure: COLONOSCOPY;  Surgeon: Ronald Lobo, MD;  Location: [facility] ENDOSCOPY;  Service: Endoscopy;  Laterality: N/A;   FEMUR IM NAIL Right 09/24/2019   Procedure: INTRAMEDULLARY (IM) NAIL FEMORAL;  Surgeon: Altamese [place], MD;  Location: [place];  Service: Orthopedics;  Laterality: Right;   IR KYPHO EA ADDL LEVEL THORACIC OR LUMBAR  04/12/2019   IR KYPHO THORACIC WITH BONE BIOPSY  04/12/2019   ROBOTIC ASSISTED SUPRACERVICAL HYSTERECTOMY WITH BILATERAL SALPINGO OOPHERECTOMY  11/06/2012   and bilateral pelvic lymph node dissection    Current Outpatient Medications  Medication Sig Dispense Refill   acetaminophen (TYLENOL) 500  MG tablet Take 1,000 mg by mouth 3 (three) times daily as needed for moderate pain or mild pain.     albuterol (VENTOLIN HFA) 108 (90 Base) MCG/ACT inhaler Inhale 2 puffs into the lungs every 4 (four) hours as needed for wheezing or shortness of breath (please try to use ALbuterol inhaler PRIOR to Nebs). 90 g 0   amiodarone (PACERONE) 200 MG tablet Take 1 tablet (200 mg total) by mouth daily. 90 tablet 3   amLODipine (NORVASC) 5 MG  tablet Take 1 tablet (5 mg total) by mouth daily. 30 tablet 6   apixaban (ELIQUIS) 2.5 MG TABS tablet Take 1 tablet (2.5 mg total) by mouth 2 (two) times daily. 180 tablet 3   atorvastatin (LIPITOR) 10 MG tablet Take 1 tablet (10 mg total) by mouth at bedtime. 90 tablet 3   Cholecalciferol (VITAMIN D) 125 MCG (5000 UT) CAPS Take 5,000 Units by mouth daily.     docusate sodium (COLACE) 100 MG capsule Take 100 mg by mouth daily.     hydrALAZINE (APRESOLINE) 50 MG tablet Take 1 tablet (50 mg total) by mouth every 8 (eight) hours. 270 tablet 3   letrozole (FEMARA) 2.5 MG tablet Take 2.5 mg by mouth daily.     loperamide (IMODIUM A-D) 2 MG tablet Take 2 mg by mouth every 6 (six) hours as needed for diarrhea or loose stools. Do not exceed '8mg'$  in 24 hours.     Multiple Vitamin (DAILY VITE PO) Take 1 tablet by mouth daily.     nystatin (MYCOSTATIN/NYSTOP) powder Apply 1 application. topically See admin instructions. Spread topically to area under bilateral breasts & groin twice a day at 8 AM and 8 PM     omeprazole (PRILOSEC) 20 MG capsule Take 20 mg by mouth daily.     polyethylene glycol (MIRALAX / GLYCOLAX) 17 g packet Take 17 g by mouth daily as needed (constipation).     Skin Protectants, Misc. (CALAZIME SKIN PROTECTANT) PSTE Apply 1 application. topically in the morning and at bedtime. Apply to sacrum     torsemide (DEMADEX) 10 MG tablet Take 10 mg by mouth every Monday, Wednesday, and Friday.     torsemide (DEMADEX) 20 MG tablet Take 10 mg by mouth daily as needed (weight gain or 5lbs in 1 week or swelling). This is in conjunction with the MWF Torsemide patient takes.     vitamin B-12 (CYANOCOBALAMIN) 250 MCG tablet Take 250 mcg by mouth every other day.     vitamin C (VITAMIN C) 500 MG tablet Take 1 tablet (500 mg total) by mouth daily.     No current facility-administered medications for this encounter.    Allergies  Allergen Reactions   Penicillins Itching and Other (See Comments)     Dizziness to the patient of passing out- told by MD to "never take this again" Did it involve swelling of the face/tongue/throat, SOB, or low BP? No Did it involve sudden or severe rash/hives, skin peeling, or any reaction on the inside of your mouth or nose? No Did you need to seek medical attention at a hospital or doctor's office? No When did it last happen? "a long time ago"    If all above answers are "NO", may proceed with cephalosporin use.     Social History   Socioeconomic History   Marital status: Widowed    Spouse name: Not on file   Number of children: Not on file   Years of education: Not on file  Highest education level: Not on file  Occupational History   Not on file  Tobacco Use   Smoking status: Never    Passive exposure: Past   Smokeless tobacco: Never  Vaping Use   Vaping Use: Never used  Substance and Sexual Activity   Alcohol use: No   Drug use: No   Sexual activity: Not on file  Other Topics Concern   Not on file  Social History Narrative   ** Merged History Encounter **       Social Determinants of Health   Financial Resource Strain: Low Risk  (09/27/2019)   Overall Financial Resource Strain (CARDIA)    Difficulty of Paying Living Expenses: Not hard at all  Food Insecurity: Unknown (09/27/2019)   Hunger Vital Sign    Worried About Running Out of Food in the Last Year: Patient refused    [place] in the Last Year: Patient refused  Transportation Needs: Unknown (09/27/2019)   PRAPARE - Transportation    Lack of Transportation (Medical): Patient refused    Lack of Transportation (Non-Medical): Patient refused  Physical Activity: Inactive (09/27/2019)   Exercise Vital Sign    Days of Exercise per Week: 0 days    Minutes of Exercise per Session: 0 min  Stress: No Stress Concern Present (09/27/2019)   [employer] [place]    Feeling of Stress : Only a little  Social Connections:  Moderately Integrated (09/27/2019)   Social Connection and Isolation Panel [NHANES]    Frequency of Communication with Friends and Family: More than three times a week    Frequency of Social Gatherings with Friends and Family: More than three times a week    Attends Religious Services: More than 4 times per year    Active Member of [employer] or Organizations: Yes    Attends Archivist Meetings: 1 to 4 times per year    Marital Status: Widowed  Intimate Partner Violence: Unknown (09/27/2019)   Humiliation, Afraid, Rape, and Kick questionnaire    Fear of Current or Ex-Partner: Patient refused    Emotionally Abused: Patient refused    Physically Abused: Patient refused    Sexually Abused: Patient refused    Family History  Problem Relation Age of Onset   Cancer Father 57       metastatic oropharyngeal ca   Heart attack Mother    Coronary artery disease Mother     ROS- All systems are reviewed and negative except as per the HPI above  Physical Exam: Vitals:   05/26/22 1107  Height: '5\' 4"'$  (1.626 m)   Wt Readings from Last 3 Encounters:  05/04/22 73.9 kg  04/14/22 74.4 kg  03/10/22 68.7 kg    Labs: Lab Results  Component Value Date   NA 143 05/04/2022   K 3.7 05/04/2022   CL 106 05/04/2022   CO2 32 03/11/2022   GLUCOSE 88 05/04/2022   BUN 22 05/04/2022   CREATININE 1.80 (H) 05/04/2022   CALCIUM 8.7 (L) 03/11/2022   PHOS 4.8 (H) 01/03/2022   MG 1.8 03/07/2022   Lab Results  Component Value Date   INR 1.5 (H) 03/07/2022   Lab Results  Component Value Date   CHOL 175 03/21/2016   HDL 63 03/21/2016   LDLCALC 81 03/21/2016   TRIG 155 (H) 03/21/2016     GEN- The patient is well appearing, alert and oriented x 3 today.   Head- normocephalic, atraumatic Eyes-  Sclera clear,  conjunctiva pink Ears- hearing intact Oropharynx- clear Neck- supple, no JVP Lymph- no cervical lymphadenopathy Lungs- Clear to ausculation bilaterally, normal work of  breathing Heart- Regular rate and rhythm, no murmurs, rubs or gallops, PMI not laterally displaced GI- soft, NT, ND, + BS Extremities- no clubbing, cyanosis, or edema MS- no significant deformity or atrophy Skin- no rash or lesion Psych- euthymic mood, full affect Neuro- strength and sensation are intact  EKG- atrial flutter at 85 bpm, qrs int 96 ms, qtc 480 ms     Assessment and Plan:  1.Afib/flutter  Unfortunately, SR at time of cardioversion was short lived and she is out of rhythm today She is not symptomatic with afib LLE may be exacerbated by afib Dr. Macky Lower plan was to stop amiodarone and continue with rate control going forward if CV did not restore/maintain SR  Nursing facility faxed stop amiodarone order She has had significant bradycardia with rate control meds on board in the past, so will defer  for now and  v rates  will need further assessment once amiodarone has washed out  Continue eliquis 2.5 mg bid  for a CHA2DS2VASc  score of at least 4  2. Diastolic HF/LE edema Wt is stable and LEE is at her baseline Elevate legs when sitting  Per general cardiology  Torsemide as ordered   F/u with cardiology 6/19  I will request f/u with Dr. Curt Bears in 3 months   Geroge Baseman. Sherard Sutch, [facility] [address] [place], [place] [address] [phone]

## 2022-05-30 ENCOUNTER — Ambulatory Visit (INDEPENDENT_AMBULATORY_CARE_PROVIDER_SITE_OTHER): Payer: Medicare Other | Admitting: Family

## 2022-05-30 ENCOUNTER — Encounter (HOSPITAL_BASED_OUTPATIENT_CLINIC_OR_DEPARTMENT_OTHER): Payer: Self-pay | Admitting: Family

## 2022-05-30 VITALS — BP 130/60 | HR 65 | Ht 64.0 in | Wt 166.0 lb

## 2022-05-30 DIAGNOSIS — I5032 Chronic diastolic (congestive) heart failure: Secondary | ICD-10-CM | POA: Diagnosis not present

## 2022-05-30 DIAGNOSIS — I480 Paroxysmal atrial fibrillation: Secondary | ICD-10-CM

## 2022-05-30 DIAGNOSIS — E782 Mixed hyperlipidemia: Secondary | ICD-10-CM | POA: Diagnosis not present

## 2022-05-30 DIAGNOSIS — D6859 Other primary thrombophilia: Secondary | ICD-10-CM | POA: Diagnosis not present

## 2022-05-30 DIAGNOSIS — I10 Essential (primary) hypertension: Secondary | ICD-10-CM

## 2022-05-30 NOTE — Patient Instructions (Signed)
Medication Instructions:  No Changes *If you need a refill on your cardiac medications before your next appointment, please call your pharmacy*   Lab Work: None If you have labs (blood work) drawn today and your tests are completely normal, you will receive your results only by: [place] (if you have MyChart) OR A paper copy in the mail If you have any lab test that is abnormal or we need to change your treatment, we will call you to review the results.   Testing/Procedures: None   Follow-Up: At [facility], you and your health needs are our priority.  As part of our continuing mission to provide you with exceptional heart care, we have created designated Provider Care Teams.  These Care Teams include your primary Cardiologist (physician) and Advanced Practice Providers (APPs -  Physician Assistants and Nurse Practitioners) who all work together to provide you with the care you need, when you need it.  We recommend signing up for the patient portal called "MyChart".  Sign up information is provided on this After Visit Summary.  MyChart is used to connect with patients for Virtual Visits (Telemedicine).  Patients are able to view lab/test results, encounter notes, upcoming appointments, etc.  Non-urgent messages can be sent to your provider as well.   To learn more about what you can do with MyChart, go to [email or web address].    Your next appointment:   6 month(s)  The format for your next appointment:   In Person  Provider:   Fransico Him, MD  or Laurann Montana, NP      Important Information About Sugar

## 2022-05-30 NOTE — Progress Notes (Signed)
Office Visit    Patient Name: Christina Morgan Date of Encounter: 05/30/2022  PCP:  Orvis Brill, [facility]  Cardiologist:  Fransico Him, MD  Advanced Practice Provider:  No care team member to display Electrophysiologist:  Will Meredith Leeds, MD      Chief Complaint    Christina Morgan is a 88 y.o. female with a hx of paroxysmal atrial fibrillation on Eliquis and flecainide, hypertension, hyperlipidemia, lower extremity edema, diastolic heart failure with moderate aortic regurgitation, osteopenia, CKD 3, symptomatic bradycardia presents today for heart failure follow-up  Past Medical History    Past Medical History:  Diagnosis Date   Cancer ([place])    endometrial ca   Chronic diastolic CHF (congestive heart failure) ([place])    Chronic edema    CKD (chronic kidney disease), stage III ([place])    Dyslipidemia    Fracture of left humerus    History of cardiovascular stress test    Lexiscan Myoview (06/2014): No ischemia or scar, not gated, low risk   Hx of echocardiogram    Echo (05/02/14): EF 60% to 65%. No regional wall motion abnormalities. Mild AI, mildly dilated aortic root (37 mm), trivial MR, trivial TR   Hyperlipidemia    Hypertension    Lipoma of skin    multiple   Osteopenia    PAF (paroxysmal atrial fibrillation) (HCC)    failed DCCV 05/2014 >> Flecainide started >> DCCV 7/15 sucessful;  f/u ETT neg for pro-arrhythmia >> recurrent AF/AFL >> Flecainide inc to 100 bid with repeat DCCV 9/15   Vitamin D deficiency 09/25/2019   Past Surgical History:  Procedure Laterality Date   CARDIOVERSION N/A 06/05/2014   Procedure: CARDIOVERSION;  Surgeon: Sueanne Margarita, MD;  Location: [place];  Service: Cardiovascular;  Laterality: N/A;   CARDIOVERSION N/A 07/11/2014   Procedure: CARDIOVERSION;  Surgeon: Sueanne Margarita, MD;  Location: [place];  Service: Cardiovascular;  Laterality: N/A;   CARDIOVERSION N/A 09/04/2014    Procedure: CARDIOVERSION;  Surgeon: Sueanne Margarita, MD;  Location: [place] ENDOSCOPY;  Service: Cardiovascular;  Laterality: N/A;   CARDIOVERSION N/A 05/04/2022   Procedure: CARDIOVERSION;  Surgeon: Donato Heinz, MD;  Location: [facility] ENDOSCOPY;  Service: Cardiovascular;  Laterality: N/A;   COLONOSCOPY  2008   COLONOSCOPY N/A 05/20/2015   Procedure: COLONOSCOPY;  Surgeon: Ronald Lobo, MD;  Location: [facility] ENDOSCOPY;  Service: Endoscopy;  Laterality: N/A;   FEMUR IM NAIL Right 09/24/2019   Procedure: INTRAMEDULLARY (IM) NAIL FEMORAL;  Surgeon: Altamese [place], MD;  Location: [place];  Service: Orthopedics;  Laterality: Right;   IR KYPHO EA ADDL LEVEL THORACIC OR LUMBAR  04/12/2019   IR KYPHO THORACIC WITH BONE BIOPSY  04/12/2019   ROBOTIC ASSISTED SUPRACERVICAL HYSTERECTOMY WITH BILATERAL SALPINGO OOPHERECTOMY  11/06/2012   and bilateral pelvic lymph node dissection    Allergies  Allergies  Allergen Reactions   Penicillins Itching and Other (See Comments)    Dizziness to the patient of passing out- told by MD to "never take this again" Did it involve swelling of the face/tongue/throat, SOB, or low BP? No Did it involve sudden or severe rash/hives, skin peeling, or any reaction on the inside of your mouth or nose? No Did you need to seek medical attention at a hospital or doctor's office? No When did it last happen? "a long time ago"    If all above answers are "NO", may proceed with cephalosporin use.     History of  Present Illness    Christina Morgan is a 88 y.o. female with a hx of paroxysmal atrial fibrillation on Eliquis and flecainide, hypertension, hyperlipidemia, lower extremity edema, diastolic heart failure with moderate aortic regurgitation, osteopenia, CKD 3, symptomatic bradycardia last seen by A-fib clinic 05/26/2022  She was admitted 1219/22 due to bradycardia.  Atenolol and clonidine were both discontinued.  She had heart rate in the 30s at times as well sinus bradycardia with  first-degree AV block.  Amlodipine was added for blood pressure control. She tested positive for COVID-19 12/11/2021. She was seen in the ED 12/15/2021 and underwent cardioversion.  Seen in follow-up 12/24/2021 doing overall well from a cardiac perspective and maintaining sinus rhythm.  Admitted 01/02/2022 with A-fib with RVR converted to sinus bradycardia as low as the 20s beta-blocker discontinued and flecainide was transition to amiodarone.  Echo with EF 55 to 60%.  Admitted 03/06/2022 with acute on chronic diastolic heart failure.  Underwent cardioversion 05/04/2022.  At follow-up with A-fib clinic 05/26/2022 she was in recurrent atrial fibrillation.  Plan for rate control strategy.  Today for follow-up with her daughter from her SNF.  Her weight is down 10 pounds over the last 5 months in our office.  Notes her weight has been steadily decreasing with torsemide 3 times per week.  Continues to have Unna boots to bilateral lower extremities.  No new or worsening dyspnea.  No chest pain.  She is overall unaware of her atrial fibrillation.    EKGs/Labs/Other Studies Reviewed:   The following studies were reviewed today:  Echo 02/12/20   1. Left ventricular ejection fraction, by estimation, is 60 to 65%. The  left ventricle has normal function. The left ventricle has no regional  wall motion abnormalities. Left ventricular diastolic parameters are  consistent with Grade II diastolic  dysfunction (pseudonormalization).   2. Left atrial size was mildly dilated.   3. The mitral valve is normal in structure and function. No evidence of  mitral valve regurgitation. No evidence of mitral stenosis.   4. The aortic valve is tricuspid. Aortic valve regurgitation is mild.  Mild to moderate aortic valve sclerosis/calcification is present, without  any evidence of aortic stenosis.   5. Tricuspid regurgitation signal is inadequate for assessing PA  pressure.   6. The inferior vena cava is normal in size with  greater than 50%  respiratory variability, suggesting right atrial pressure of 3 mmHg.   7. Technically difficult study with poor acoustic windows.   EKG: No EKG today  Recent Labs: 02/18/2022: NT-Pro BNP 264; TSH 10.400 03/06/2022: B Natriuretic Peptide 62.2 03/07/2022: Magnesium 1.8 03/10/2022: Platelets 130 03/11/2022: ALT 31 05/04/2022: BUN 22; Creatinine, Ser 1.80; Hemoglobin 10.9; Potassium 3.7; Sodium 143  Recent Lipid Panel    Component Value Date/Time   CHOL 175 03/21/2016 0915   TRIG 155 (H) 03/21/2016 0915   HDL 63 03/21/2016 0915   CHOLHDL 2.8 03/21/2016 0915   VLDL 31 (H) 03/21/2016 0915   LDLCALC 81 03/21/2016 0915    Risk Assessment/Calculations:   CHA2DS2-VASc Score = 5   This indicates a 7.2% annual risk of stroke. The patient's score is based upon: CHF History: 1 HTN History: 1 Diabetes History: 0 Stroke History: 0 Vascular Disease History: 0 Age Score: 2 Gender Score: 1   Home Medications   Current Meds  Medication Sig   acetaminophen (TYLENOL) 500 MG tablet Take 1,000 mg by mouth 3 (three) times daily as needed for moderate pain or mild pain.  albuterol (VENTOLIN HFA) 108 (90 Base) MCG/ACT inhaler Inhale 2 puffs into the lungs every 4 (four) hours as needed for wheezing or shortness of breath (please try to use ALbuterol inhaler PRIOR to Nebs).   amLODipine (NORVASC) 5 MG tablet Take 1 tablet (5 mg total) by mouth daily.   apixaban (ELIQUIS) 2.5 MG TABS tablet Take 1 tablet (2.5 mg total) by mouth 2 (two) times daily.   atorvastatin (LIPITOR) 10 MG tablet Take 1 tablet (10 mg total) by mouth at bedtime.   Cholecalciferol (VITAMIN D) 125 MCG (5000 UT) CAPS Take 5,000 Units by mouth daily.   docusate sodium (COLACE) 100 MG capsule Take 100 mg by mouth daily.   hydrALAZINE (APRESOLINE) 50 MG tablet Take 1 tablet (50 mg total) by mouth every 8 (eight) hours.   letrozole (FEMARA) 2.5 MG tablet Take 2.5 mg by mouth daily.   loperamide (IMODIUM A-D) 2 MG  tablet Take 2 mg by mouth every 6 (six) hours as needed for diarrhea or loose stools. Do not exceed '8mg'$  in 24 hours.   Multiple Vitamin (DAILY VITE PO) Take 1 tablet by mouth daily.   nystatin (MYCOSTATIN/NYSTOP) powder Apply 1 application. topically See admin instructions. Spread topically to area under bilateral breasts & groin twice a day at 8 AM and 8 PM   omeprazole (PRILOSEC) 20 MG capsule Take 20 mg by mouth daily.   polyethylene glycol (MIRALAX / GLYCOLAX) 17 g packet Take 17 g by mouth daily as needed (constipation).   Skin Protectants, Misc. (CALAZIME SKIN PROTECTANT) PSTE Apply 1 application. topically in the morning and at bedtime. Apply to sacrum   torsemide (DEMADEX) 10 MG tablet Take 10 mg by mouth every Monday, Wednesday, and Friday.   torsemide (DEMADEX) 20 MG tablet Take 10 mg by mouth daily as needed (weight gain or 5lbs in 1 week or swelling). This is in conjunction with the MWF Torsemide patient takes.   vitamin B-12 (CYANOCOBALAMIN) 250 MCG tablet Take 250 mcg by mouth every other day.   vitamin C (VITAMIN C) 500 MG tablet Take 1 tablet (500 mg total) by mouth daily.     Review of Systems      All other systems reviewed and are otherwise negative except as noted above.  Physical Exam    VS:  BP 130/60 (BP Location: Left Arm, Patient Position: Sitting, Cuff Size: Normal)   Pulse 65   Ht '5\' 4"'$  (1.626 m)   Wt 166 lb (75.3 kg)   SpO2 97%   BMI 28.49 kg/m  , BMI Body mass index is 28.49 kg/m.  Wt Readings from Last 3 Encounters:  05/30/22 166 lb (75.3 kg)  05/26/22 165 lb (74.8 kg)  05/04/22 163 lb (73.9 kg)     GEN: Well nourished, well developed, in no acute distress. HEENT: normal. Neck: Supple, no JVD, carotid bruits, or masses. Cardiac: RRR, no murmurs, rubs, or gallops. No clubbing, cyanosis, edema.  Radials/PT 2+ and equal bilaterally.  Respiratory:  Respirations regular and unlabored, clear to auscultation bilaterally. GI: Soft, nontender,  nondistended. MS: No deformity or atrophy. Skin: Warm and dry, no rash. Neuro:  Strength and sensation are intact. Psych: Normal affect.  Assessment & Plan    PAF / Chronic anticoagulation -plan for rate control with amiodarone alone.  Beta-blocker previously discontinued due to bradycardia.  Continue Eliquis 2.5 mg twice daily.  Reduced dose due to age, renal function. CHA2DS2-VASc Score = 5 [CHF History: 1, HTN History: 1, Diabetes History: 0, Stroke  History: 0, Vascular Disease History: 0, Age Score: 2, Gender Score: 1].  Therefore, the patient's annual risk of stroke is 7.2 %.    Denies bleeding complications.  HLD - Continue Lipitor '10mg'$  QD.  Denies myalgias  Chronic diastolic heart failure - Euvolemic and well compensated on exam.  Continue torsemide 10 mg 3 times per week.  We will ask her facility to weigh once per week and report weight gain of 5 pounds in 1 week.  She may take additional 10 mg torsemide as needed for weight and 5 pounds in 1 week or lower extremity edema.  She presently has an Haematologist in place.   HTN - BP well controlled. Continue current antihypertensive regimen.     Disposition: Follow up in 6 month(s) with Fransico Him, MD or APP.  Signed, Loel Dubonnet, NP 05/30/2022, 3:41 PM [place]

## 2022-08-29 ENCOUNTER — Ambulatory Visit (INDEPENDENT_AMBULATORY_CARE_PROVIDER_SITE_OTHER): Payer: Medicare Other

## 2022-08-29 ENCOUNTER — Encounter: Payer: Self-pay | Admitting: Cardiology

## 2022-08-29 ENCOUNTER — Ambulatory Visit: Payer: Medicare Other | Attending: Cardiology | Admitting: Cardiology

## 2022-08-29 VITALS — BP 138/74 | HR 114 | Ht 64.0 in | Wt 160.0 lb

## 2022-08-29 DIAGNOSIS — I4819 Other persistent atrial fibrillation: Secondary | ICD-10-CM | POA: Diagnosis present

## 2022-08-29 NOTE — Progress Notes (Signed)
Electrophysiology Office Note   Date:  08/29/2022   ID:  Christina Morgan, Christina Morgan 03-Sep-1933, MRN [id number]  PCP:  Orvis Brill, [facility]  Cardiologist:  Radford Pax Primary Electrophysiologist:  Alana Dayton Meredith Leeds, MD    Chief Complaint: AF   History of Present Illness: Christina Morgan is a 88 y.o. female who is being seen today for the evaluation of AF at the request of [facility] Presenting today for electrophysiology evaluation.  She has a history significant for hypertension, hyperlipidemia, diastolic heart failure, CKD stage III, uterine cancer, atrial fibrillation.  She presented to hospital January 2023 with atrial fibrillation.  She has had significant issues with bradycardia and thus was switched from flecainide to amiodarone.  Today, denies symptoms of palpitations, chest pain, shortness of breath, orthopnea, PND, lower extremity edema, claudication, dizziness, presyncope, syncope, bleeding, or neurologic sequela. The patient is tolerating medications without difficulties.  Currently feels well.  She has no chest pain or shortness of breath.  She remains in atrial fibrillation that she is asymptomatic.  She is able to all of her daily activities.    Past Medical History:  Diagnosis Date   Cancer [facility])    endometrial ca   Chronic diastolic CHF (congestive heart failure) (HCC)    Chronic edema    CKD (chronic kidney disease), stage III (HCC)    Dyslipidemia    Fracture of left humerus    History of cardiovascular stress test    Lexiscan Myoview (06/2014): No ischemia or scar, not gated, low risk   Hx of echocardiogram    Echo (05/02/14): EF 60% to 65%. No regional wall motion abnormalities. Mild AI, mildly dilated aortic root (37 mm), trivial MR, trivial TR   Hyperlipidemia    Hypertension    Lipoma of skin    multiple   Osteopenia    PAF (paroxysmal atrial fibrillation) (HCC)    failed DCCV 05/2014 >> Flecainide started >> DCCV 7/15 sucessful;  f/u  ETT neg for pro-arrhythmia >> recurrent AF/AFL >> Flecainide inc to 100 bid with repeat DCCV 9/15   Vitamin D deficiency 09/25/2019   Past Surgical History:  Procedure Laterality Date   CARDIOVERSION N/A 06/05/2014   Procedure: CARDIOVERSION;  Surgeon: Sueanne Margarita, MD;  Location: [place];  Service: Cardiovascular;  Laterality: N/A;   CARDIOVERSION N/A 07/11/2014   Procedure: CARDIOVERSION;  Surgeon: Sueanne Margarita, MD;  Location: [place];  Service: Cardiovascular;  Laterality: N/A;   CARDIOVERSION N/A 09/04/2014   Procedure: CARDIOVERSION;  Surgeon: Sueanne Margarita, MD;  Location: [place] ENDOSCOPY;  Service: Cardiovascular;  Laterality: N/A;   CARDIOVERSION N/A 05/04/2022   Procedure: CARDIOVERSION;  Surgeon: Donato Heinz, MD;  Location: [facility] ENDOSCOPY;  Service: Cardiovascular;  Laterality: N/A;   COLONOSCOPY  2008   COLONOSCOPY N/A 05/20/2015   Procedure: COLONOSCOPY;  Surgeon: Ronald Lobo, MD;  Location: [facility] ENDOSCOPY;  Service: Endoscopy;  Laterality: N/A;   FEMUR IM NAIL Right 09/24/2019   Procedure: INTRAMEDULLARY (IM) NAIL FEMORAL;  Surgeon: Altamese Holden, MD;  Location: [place];  Service: Orthopedics;  Laterality: Right;   IR KYPHO EA ADDL LEVEL THORACIC OR LUMBAR  04/12/2019   IR KYPHO THORACIC WITH BONE BIOPSY  04/12/2019   ROBOTIC ASSISTED SUPRACERVICAL HYSTERECTOMY WITH BILATERAL SALPINGO OOPHERECTOMY  11/06/2012   and bilateral pelvic lymph node dissection     Current Outpatient Medications  Medication Sig Dispense Refill   acetaminophen (TYLENOL) 500 MG tablet Take 1,000 mg by mouth 3 (three) times daily as  needed for moderate pain or mild pain.     albuterol (VENTOLIN HFA) 108 (90 Base) MCG/ACT inhaler Inhale 2 puffs into the lungs every 4 (four) hours as needed for wheezing or shortness of breath (please try to use ALbuterol inhaler PRIOR to Nebs). 90 g 0   amLODipine (NORVASC) 5 MG tablet Take 1 tablet (5 mg total) by mouth daily. 30 tablet 6   apixaban  (ELIQUIS) 2.5 MG TABS tablet Take 1 tablet (2.5 mg total) by mouth 2 (two) times daily. 180 tablet 3   atorvastatin (LIPITOR) 10 MG tablet Take 1 tablet (10 mg total) by mouth at bedtime. 90 tablet 3   Cholecalciferol (VITAMIN D) 125 MCG (5000 UT) CAPS Take 5,000 Units by mouth daily.     clindamycin (CLEOCIN) 150 MG capsule Take by mouth.     docusate sodium (COLACE) 100 MG capsule Take 100 mg by mouth daily.     hydrALAZINE (APRESOLINE) 50 MG tablet Take 1 tablet (50 mg total) by mouth every 8 (eight) hours. 270 tablet 3   letrozole (FEMARA) 2.5 MG tablet Take 2.5 mg by mouth daily.     loperamide (IMODIUM A-D) 2 MG tablet Take 2 mg by mouth every 6 (six) hours as needed for diarrhea or loose stools. Do not exceed '8mg'$  in 24 hours.     Multiple Vitamin (DAILY VITE PO) Take 1 tablet by mouth daily.     nystatin (MYCOSTATIN/NYSTOP) powder Apply 1 application. topically See admin instructions. Spread topically to area under bilateral breasts & groin twice a day at 8 AM and 8 PM     omeprazole (PRILOSEC) 20 MG capsule Take 20 mg by mouth daily.     polyethylene glycol (MIRALAX / GLYCOLAX) 17 g packet Take 17 g by mouth daily as needed (constipation).     Skin Protectants, Misc. (CALAZIME SKIN PROTECTANT) PSTE Apply 1 application. topically in the morning and at bedtime. Apply to sacrum     torsemide (DEMADEX) 10 MG tablet Take 10 mg by mouth every Monday, Wednesday, and Friday.     torsemide (DEMADEX) 20 MG tablet Take 10 mg by mouth daily as needed (weight gain or 5lbs in 1 week or swelling). This is in conjunction with the MWF Torsemide patient takes.     triamcinolone cream (KENALOG) 0.1 % Apply topically.     vitamin B-12 (CYANOCOBALAMIN) 250 MCG tablet Take 250 mcg by mouth every other day.     vitamin C (VITAMIN C) 500 MG tablet Take 1 tablet (500 mg total) by mouth daily.     No current facility-administered medications for this visit.    Allergies:   Penicillins   Social History:  The  patient  reports that she has never smoked. She has been exposed to tobacco smoke. She has never used smokeless tobacco. She reports that she does not drink alcohol and does not use drugs.   Family History:  The patient's family history includes Cancer (age of onset: 52) in her father; Coronary artery disease in her mother; Heart attack in her mother.   ROS:  Please see the history of present illness.   Otherwise, review of systems is positive for none.   All other systems are reviewed and negative.   PHYSICAL EXAM: VS:  BP 138/74   Pulse (!) 114   Ht '5\' 4"'$  (1.626 m)   Wt 160 lb (72.6 kg)   SpO2 97%   BMI 27.46 kg/m  , BMI Body mass index is 27.46 kg/m.  GEN: Well nourished, well developed, in no acute distress  HEENT: normal  Neck: no JVD, carotid bruits, or masses Cardiac: irregular; no murmurs, rubs, or gallops,no edema  Respiratory:  clear to auscultation bilaterally, normal work of breathing GI: soft, nontender, nondistended, + BS MS: no deformity or atrophy  Skin: warm and dry Neuro:  Strength and sensation are intact Psych: euthymic mood, full affect  EKG:  EKG is ordered today. Personal review of the ekg ordered shows atrial fibrillation, rate 114  Recent Labs: 02/18/2022: NT-Pro BNP 264; TSH 10.400 03/06/2022: B Natriuretic Peptide 62.2 03/07/2022: Magnesium 1.8 03/10/2022: Platelets 130 03/11/2022: ALT 31 05/04/2022: BUN 22; Creatinine, Ser 1.80; Hemoglobin 10.9; Potassium 3.7; Sodium 143    Lipid Panel     Component Value Date/Time   CHOL 175 03/21/2016 0915   TRIG 155 (H) 03/21/2016 0915   HDL 63 03/21/2016 0915   CHOLHDL 2.8 03/21/2016 0915   VLDL 31 (H) 03/21/2016 0915   LDLCALC 81 03/21/2016 0915     Wt Readings from Last 3 Encounters:  08/29/22 160 lb (72.6 kg)  05/30/22 166 lb (75.3 kg)  05/26/22 165 lb (74.8 kg)      Other studies Reviewed: Additional studies/ records that were reviewed today include: TTE 01/03/22  Review of the above records today  demonstrates:   1. Abnormal septal motion. Left ventricular ejection fraction, by  estimation, is 55 to 60%. The left ventricle has normal function. The left  ventricle has no regional wall motion abnormalities. There is mild left  ventricular hypertrophy. Left  ventricular diastolic parameters are indeterminate.   2. Right ventricular systolic function is moderately reduced. The right  ventricular size is moderately enlarged. There is moderately elevated  pulmonary artery systolic pressure.   3. The mitral valve is degenerative. Trivial mitral valve regurgitation.  No evidence of mitral stenosis.   4. AV severely thickened and calcified now with mild stenosis compared to  TTE done 02/12/20 There contiues to also be some shadowing artifact in LVOT  doubt vegetation and was present on TTE 2021 . The aortic valve is  tricuspid. There is severe  calcifcation of the aortic valve. There is severe thickening of the aortic  valve. Aortic valve regurgitation is mild to moderate. Mild aortic valve  stenosis.   5. Aortic dilatation noted. There is mild dilatation of the ascending  aorta, measuring 39 mm.   6. The inferior vena cava is normal in size with greater than 50%  respiratory variability, suggesting right atrial pressure of 3 mmHg.    ASSESSMENT AND PLAN:  1.  Persistent atrial fibrillation: On Eliquis 2.5 mg twice daily.  CHA2DS2-VASc of 4.  Was previously on amiodarone for a rhythm control strategy but she continued to have episodes of atrial fibrillation.  She feels well in atrial fibrillation.  Her heart rate is fast today.  We Trichelle Lehan have her wear a 3-day monitor for heart rate.  2.  Chronic diastolic heart failure: Has chronic lower extremity edema.  Volume management per primary cardiology  3.  Hypertension: Currently well controlled  4.  Secondary hypercoagulable state: Currently on Eliquis for atrial fibrillation as above  We Purnell Daigle follow her back as needed.  Patient wishes to see  1 cardiologist as we are no longer planning a rhythm control strategy.  Current medicines are reviewed at length with the patient today.   The patient does not have concerns regarding her medicines.  The following changes were made today: None  Labs/ tests ordered  today include:  Orders Placed This Encounter  Procedures   LONG TERM MONITOR (3-14 DAYS)     Disposition:   FU as needed  Signed, Christina Mccutchan Meredith Leeds, MD  08/29/2022 2:30 PM     [place] [address] [place] [address] [phone] (office) [phone] (fax)

## 2022-08-29 NOTE — Progress Notes (Unsigned)
Enrolled for [employer] to mail a ZIO XT long term holter monitor to the patients address on file.  

## 2022-08-29 NOTE — Patient Instructions (Addendum)
Medication Instructions:  Your physician recommends that you continue on your current medications as directed. Please refer to the Current Medication list given to you today.  *If you need a refill on your cardiac medications before your next appointment, please call your pharmacy*   Lab Work: None ordered   Testing/Procedures:                            Your physician has recommended that you wear a 3 day(s)/ week(s) heart monitor (ZIO patch) - This will be mailed directly to your home address/ placed in office today - You may receive a call directly from the company for Christina Morgan, which is [employer] within the next few day- if you see an 800# or 224# calling, please answer - Once the monitor is applied and activated it will record your heart rate/ rhythm for the duration that you are wearing it - If you are having any symptoms (dizziness/ lightheadedness/ palpitations/ racing heart/ anything that just doesn't feel right) then you will push the button in the center of the monitor to mark you were having a symptom - Please DO NOT shower for 24 hours after the monitor is placed - No tub baths/ swimming pools/ hot tubs while wearing the monitor - Do not put any lotions, oils, or ointments around the monitor - If you have any issues with the monitor itself, then please call the 800 # for the company - After 3 day(s)/ week(s) please take the monitor off at home and mail it back ([place] mail) in the box provided by [employer], postage is already paid.    Follow-Up: At [facility], you and your health needs are our priority.  As part of our continuing mission to provide you with exceptional heart care, we have created designated Provider Care Teams.  These Care Teams include your primary Cardiologist (physician) and Advanced Practice Providers (APPs -  Physician Assistants and Nurse Practitioners) who all work together to provide you with the care you need, when you need it.  Your next appointment:    Keep your   appointment in December with Christina Montana, NP  The format for your next appointment:   In Person   Your physician recommends that you schedule a follow-up appointment as needed with Dr. Curt Morgan.  Thank you for choosing [facility]!!   [name], RN [phone]  Other Instructions    Important Information About Sugar

## 2022-08-30 NOTE — Addendum Note (Signed)
Addended by: Michelle Nasuti on: 08/30/2022 10:14 AM   Modules accepted: Orders

## 2022-09-07 DIAGNOSIS — I4819 Other persistent atrial fibrillation: Secondary | ICD-10-CM | POA: Diagnosis not present

## 2022-11-25 ENCOUNTER — Ambulatory Visit (INDEPENDENT_AMBULATORY_CARE_PROVIDER_SITE_OTHER): Payer: Medicare Other | Admitting: Family

## 2022-11-25 ENCOUNTER — Encounter (HOSPITAL_BASED_OUTPATIENT_CLINIC_OR_DEPARTMENT_OTHER): Payer: Self-pay | Admitting: Family

## 2022-11-25 VITALS — BP 142/82 | HR 70 | Ht 64.0 in | Wt 159.0 lb

## 2022-11-25 DIAGNOSIS — D6859 Other primary thrombophilia: Secondary | ICD-10-CM | POA: Diagnosis not present

## 2022-11-25 DIAGNOSIS — I10 Essential (primary) hypertension: Secondary | ICD-10-CM | POA: Diagnosis not present

## 2022-11-25 DIAGNOSIS — I4819 Other persistent atrial fibrillation: Secondary | ICD-10-CM | POA: Diagnosis not present

## 2022-11-25 NOTE — Progress Notes (Unsigned)
Office Visit    Patient Name: Christina Morgan Date of Encounter: 11/25/2022  PCP:  Orvis Brill, [facility]  Cardiologist:  Fransico Him, MD  Advanced Practice Provider:  No care team member to display Electrophysiologist:  Will Meredith Leeds, MD      Chief Complaint    Christina Morgan is a 88 y.o. female with a hx of paroxysmal atrial fibrillation on Eliquis and flecainide, hypertension, hyperlipidemia, lower extremity edema, diastolic heart failure with moderate aortic regurgitation, osteopenia, CKD 3, symptomatic bradycardia presents today for heart failure follow-up  Past Medical History    Past Medical History:  Diagnosis Date   Cancer ([place])    endometrial ca   Chronic diastolic CHF (congestive heart failure) ([place])    Chronic edema    CKD (chronic kidney disease), stage III ([place])    Dyslipidemia    Fracture of left humerus    History of cardiovascular stress test    Lexiscan Myoview (06/2014): No ischemia or scar, not gated, low risk   Hx of echocardiogram    Echo (05/02/14): EF 60% to 65%. No regional wall motion abnormalities. Mild AI, mildly dilated aortic root (37 mm), trivial MR, trivial TR   Hyperlipidemia    Hypertension    Lipoma of skin    multiple   Osteopenia    PAF (paroxysmal atrial fibrillation) (HCC)    failed DCCV 05/2014 >> Flecainide started >> DCCV 7/15 sucessful;  f/u ETT neg for pro-arrhythmia >> recurrent AF/AFL >> Flecainide inc to 100 bid with repeat DCCV 9/15   Vitamin D deficiency 09/25/2019   Past Surgical History:  Procedure Laterality Date   CARDIOVERSION N/A 06/05/2014   Procedure: CARDIOVERSION;  Surgeon: Sueanne Margarita, MD;  Location: [place];  Service: Cardiovascular;  Laterality: N/A;   CARDIOVERSION N/A 07/11/2014   Procedure: CARDIOVERSION;  Surgeon: Sueanne Margarita, MD;  Location: [place];  Service: Cardiovascular;  Laterality: N/A;   CARDIOVERSION N/A 09/04/2014    Procedure: CARDIOVERSION;  Surgeon: Sueanne Margarita, MD;  Location: [place] ENDOSCOPY;  Service: Cardiovascular;  Laterality: N/A;   CARDIOVERSION N/A 05/04/2022   Procedure: CARDIOVERSION;  Surgeon: Donato Heinz, MD;  Location: [facility] ENDOSCOPY;  Service: Cardiovascular;  Laterality: N/A;   COLONOSCOPY  2008   COLONOSCOPY N/A 05/20/2015   Procedure: COLONOSCOPY;  Surgeon: Ronald Lobo, MD;  Location: [facility] ENDOSCOPY;  Service: Endoscopy;  Laterality: N/A;   FEMUR IM NAIL Right 09/24/2019   Procedure: INTRAMEDULLARY (IM) NAIL FEMORAL;  Surgeon: Altamese Belvidere, MD;  Location: [place];  Service: Orthopedics;  Laterality: Right;   IR KYPHO EA ADDL LEVEL THORACIC OR LUMBAR  04/12/2019   IR KYPHO THORACIC WITH BONE BIOPSY  04/12/2019   ROBOTIC ASSISTED SUPRACERVICAL HYSTERECTOMY WITH BILATERAL SALPINGO OOPHERECTOMY  11/06/2012   and bilateral pelvic lymph node dissection    Allergies  Allergies  Allergen Reactions   Penicillins Itching and Other (See Comments)    Dizziness to the patient of passing out- told by MD to "never take this again" Did it involve swelling of the face/tongue/throat, SOB, or low BP? No Did it involve sudden or severe rash/hives, skin peeling, or any reaction on the inside of your mouth or nose? No Did you need to seek medical attention at a hospital or doctor's office? No When did it last happen? "a long time ago"    If all above answers are "NO", may proceed with cephalosporin use.     History of  Present Illness    Christina Morgan is a 88 y.o. female with a hx of paroxysmal atrial fibrillation on Eliquis and flecainide, hypertension, hyperlipidemia, lower extremity edema, diastolic heart failure with moderate aortic regurgitation, osteopenia, CKD 3, symptomatic bradycardia last seen by Dr. Curt Bears 08/29/2022  Admitted 11/30/2021 with bradycardia.  Atenolol clonidine discontinued.  Amlodipine added for blood pressure control. She tested positive for COVID-19 12/11/2021.  She was seen in the ED 12/15/2021 and underwent cardioversion. Seen in follow-up 12/24/2021  maintaining sinus rhythm.  Admitted 01/02/2022 with A-fib with RVR converted to sinus bradycardia as low as the 20s beta-blocker discontinued and flecainide was transition to amiodarone.  Echo with EF 55 to 60%.  Admitted 03/06/2022 with acute on chronic diastolic heart failure.  Underwent cardioversion 05/04/2022.  At follow-up with A-fib clinic 05/26/2022 she was in recurrent atrial fibrillation.  Plan for rate control strategy and as such amiodarone discontinued.  3-day monitor 08/29/2022 with average heart rate 95 bpm in atrial fibrillation.  Today for follow-up with her daughter from her SNF.  Feeling overall well since last seen.  Enjoys going to activities at her SNF such as [employer] for the Christmas season.  SNF checks her blood pressure intermittently with blood pressure routinely 140s over 80s.  Notes her lower extremity edema is much improved since last visit.  No chest pain, stable mild exertional dyspnea.   EKGs/Labs/Other Studies Reviewed:   The following studies were reviewed today:  Echo 02/12/20   1. Left ventricular ejection fraction, by estimation, is 60 to 65%. The  left ventricle has normal function. The left ventricle has no regional  wall motion abnormalities. Left ventricular diastolic parameters are  consistent with Grade II diastolic  dysfunction (pseudonormalization).   2. Left atrial size was mildly dilated.   3. The mitral valve is normal in structure and function. No evidence of  mitral valve regurgitation. No evidence of mitral stenosis.   4. The aortic valve is tricuspid. Aortic valve regurgitation is mild.  Mild to moderate aortic valve sclerosis/calcification is present, without  any evidence of aortic stenosis.   5. Tricuspid regurgitation signal is inadequate for assessing PA  pressure.   6. The inferior vena cava is normal in size with greater than 50%  respiratory  variability, suggesting right atrial pressure of 3 mmHg.   7. Technically difficult study with poor acoustic windows.   EKG: No EKG today  Recent Labs: 02/18/2022: NT-Pro BNP 264; TSH 10.400 03/06/2022: B Natriuretic Peptide 62.2 03/07/2022: Magnesium 1.8 03/10/2022: Platelets 130 03/11/2022: ALT 31 05/04/2022: BUN 22; Creatinine, Ser 1.80; Hemoglobin 10.9; Potassium 3.7; Sodium 143  Recent Lipid Panel    Component Value Date/Time   CHOL 175 03/21/2016 0915   TRIG 155 (H) 03/21/2016 0915   HDL 63 03/21/2016 0915   CHOLHDL 2.8 03/21/2016 0915   VLDL 31 (H) 03/21/2016 0915   LDLCALC 81 03/21/2016 0915    Risk Assessment/Calculations:   CHA2DS2-VASc Score = 5   This indicates a 7.2% annual risk of stroke. The patient's score is based upon: CHF History: 1 HTN History: 1 Diabetes History: 0 Stroke History: 0 Vascular Disease History: 0 Age Score: 2 Gender Score: 1   Home Medications   Current Meds  Medication Sig   acetaminophen (TYLENOL) 500 MG tablet Take 1,000 mg by mouth 3 (three) times daily as needed for moderate pain or mild pain.   amLODipine (NORVASC) 5 MG tablet Take 1 tablet (5 mg total) by mouth daily.   apixaban (  ELIQUIS) 2.5 MG TABS tablet Take 1 tablet (2.5 mg total) by mouth 2 (two) times daily.   atorvastatin (LIPITOR) 10 MG tablet Take 1 tablet (10 mg total) by mouth at bedtime.   Cholecalciferol (VITAMIN D) 125 MCG (5000 UT) CAPS Take 5,000 Units by mouth daily.   clindamycin (CLEOCIN) 150 MG capsule Take by mouth.   docusate sodium (COLACE) 100 MG capsule Take 100 mg by mouth daily.   hydrALAZINE (APRESOLINE) 50 MG tablet Take 1 tablet (50 mg total) by mouth every 8 (eight) hours.   letrozole (FEMARA) 2.5 MG tablet Take 2.5 mg by mouth daily.   loperamide (IMODIUM A-D) 2 MG tablet Take 2 mg by mouth every 6 (six) hours as needed for diarrhea or loose stools. Do not exceed '8mg'$  in 24 hours.   Multiple Vitamin (DAILY VITE PO) Take 1 tablet by mouth daily.    nystatin (MYCOSTATIN/NYSTOP) powder Apply 1 application. topically See admin instructions. Spread topically to area under bilateral breasts & groin twice a day at 8 AM and 8 PM   omeprazole (PRILOSEC) 20 MG capsule Take 20 mg by mouth daily.   polyethylene glycol (MIRALAX / GLYCOLAX) 17 g packet Take 17 g by mouth daily as needed (constipation).   Skin Protectants, Misc. (CALAZIME SKIN PROTECTANT) PSTE Apply 1 application. topically in the morning and at bedtime. Apply to sacrum   torsemide (DEMADEX) 10 MG tablet Take 10 mg by mouth every Monday, Wednesday, and Friday.   torsemide (DEMADEX) 10 MG tablet Take 10 mg by mouth daily as needed (weight gain of 5lbs in one week or 3lbs overnight.). Take in conjunction with MWF dose   triamcinolone cream (KENALOG) 0.1 % Apply topically.   vitamin B-12 (CYANOCOBALAMIN) 250 MCG tablet Take 250 mcg by mouth every other day.   vitamin C (VITAMIN C) 500 MG tablet Take 1 tablet (500 mg total) by mouth daily.     Review of Systems      All other systems reviewed and are otherwise negative except as noted above.  Physical Exam    VS:  BP (!) 148/92   Pulse 70   Ht '5\' 4"'$  (1.626 m)   Wt 159 lb (72.1 kg)   BMI 27.29 kg/m  , BMI Body mass index is 27.29 kg/m.  Wt Readings from Last 3 Encounters:  11/25/22 159 lb (72.1 kg)  08/29/22 160 lb (72.6 kg)  05/30/22 166 lb (75.3 kg)     GEN: Well nourished, well developed, in no acute distress. HEENT: normal. Neck: Supple, no JVD, carotid bruits, or masses. Cardiac: RRR, no murmurs, rubs, or gallops. No clubbing, cyanosis, edema.  Radials/PT 2+ and equal bilaterally.  Respiratory:  Respirations regular and unlabored, clear to auscultation bilaterally. GI: Soft, nontender, nondistended. MS: No deformity or atrophy. Skin: Warm and dry, no rash. Neuro:  Strength and sensation are intact. Psych: Normal affect.  Assessment & Plan    PAF / Chronic anticoagulation -Continue rate control as she is  asymptomatic.  Beta-blocker previously discontinued due to bradycardia.  Continue Eliquis 2.5 mg twice daily.  Reduced dose due to age, renal function. CHA2DS2-VASc Score = 5 [CHF History: 1, HTN History: 1, Diabetes History: 0, Stroke History: 0, Vascular Disease History: 0, Age Score: 2, Gender Score: 1].  Therefore, the patient's annual risk of stroke is 7.2 %.    Denies bleeding complications.  HLD - Continue Lipitor '10mg'$  QD.  Denies myalgias  Chronic diastolic heart failure - Euvolemic and well compensated on exam.  Continue torsemide 10 mg 3 times per week.  We will ask her facility to weigh once per week and report weight gain of 5 pounds in 1 week.  She may take additional 10 mg torsemide as needed for weight and 5 pounds in 1 week or lower extremity edema.    HTN - BP not at goal <130/80. She will ask her SNF to check. If BP not at goal <130/80, plan to increase Amlodipine. Continue Hydralazine.    Disposition: Follow up in 4 month(s) with Fransico Him, MD or APP.  Signed, Loel Dubonnet, NP 11/25/2022, 3:26 PM [facility]

## 2022-11-25 NOTE — Patient Instructions (Signed)
Medication Instructions:  Your Physician recommend you continue on your current medication as directed.    *If you need a refill on your cardiac medications before your next appointment, please call your pharmacy*  Follow-Up: At [facility], you and your health needs are our priority.  As part of our continuing mission to provide you with exceptional heart care, we have created designated Provider Care Teams.  These Care Teams include your primary Cardiologist (physician) and Advanced Practice Providers (APPs -  Physician Assistants and Nurse Practitioners) who all work together to provide you with the care you need, when you need it.  We recommend signing up for the patient portal called "MyChart".  Sign up information is provided on this After Visit Summary.  MyChart is used to connect with patients for Virtual Visits (Telemedicine).  Patients are able to view lab/test results, encounter notes, upcoming appointments, etc.  Non-urgent messages can be sent to your provider as well.   To learn more about what you can do with MyChart, go to [email or web address].    Your next appointment:   4 month(s)  The format for your next appointment:   In Person  Provider:   Dr. Radford Pax or APP

## 2022-11-28 ENCOUNTER — Telehealth: Payer: Self-pay | Admitting: Family

## 2022-11-28 ENCOUNTER — Encounter (HOSPITAL_BASED_OUTPATIENT_CLINIC_OR_DEPARTMENT_OTHER): Payer: Self-pay | Admitting: Family

## 2022-11-28 MED ORDER — AMLODIPINE BESYLATE 10 MG PO TABS: 10.0000 mg | ORAL_TABLET | Freq: Every day | ORAL | 3 refills | Status: DC

## 2022-11-28 NOTE — Telephone Encounter (Signed)
Returned call to patient's daughter, patient was seen Friday. Increasing blood pressure medication that day or taking blood pressure 3x per week for a couple weeks. The facility is going to charge her to check BP extra. Requesting that new prescription be faxed to the following number.   Fax new prescription to [phone]  Consulted with Laurann Montana, NP- increase amlodipine to '10mg'$  daily and ok to fax prescription.   Daughter notified.

## 2022-11-28 NOTE — Telephone Encounter (Signed)
Pt daughter calling because she would like to speak to someone about increasing the dosage on medication (did not remember the name but states they discussed at her last OV).

## 2022-12-21 NOTE — Telephone Encounter (Signed)
BP log received:  12/1 149/87 12/2 147/73 12/8 158/92 12/15 170/90 12/20 175/83 12/29 157/92  Markedly higher than what obtained in clinic. Uncertain whether checking prior or post medications or if resting prior to checking. To avoid hypotension and falls, continue same regimen and follow up as scheduled.   Loel Dubonnet, NP

## 2023-01-19 ENCOUNTER — Encounter (HOSPITAL_COMMUNITY): Payer: Self-pay | Admitting: *Deleted

## 2023-03-16 ENCOUNTER — Ambulatory Visit (INDEPENDENT_AMBULATORY_CARE_PROVIDER_SITE_OTHER): Payer: Medicare Other | Admitting: Family

## 2023-03-16 ENCOUNTER — Encounter (HOSPITAL_BASED_OUTPATIENT_CLINIC_OR_DEPARTMENT_OTHER): Payer: Self-pay | Admitting: Family

## 2023-03-16 VITALS — BP 138/64 | HR 97 | Ht 64.0 in | Wt 165.8 lb

## 2023-03-16 DIAGNOSIS — D6859 Other primary thrombophilia: Secondary | ICD-10-CM

## 2023-03-16 DIAGNOSIS — I5032 Chronic diastolic (congestive) heart failure: Secondary | ICD-10-CM | POA: Diagnosis not present

## 2023-03-16 DIAGNOSIS — I10 Essential (primary) hypertension: Secondary | ICD-10-CM | POA: Diagnosis not present

## 2023-03-16 DIAGNOSIS — I4819 Other persistent atrial fibrillation: Secondary | ICD-10-CM | POA: Diagnosis not present

## 2023-03-16 NOTE — Patient Instructions (Signed)
Medication Instructions:  Your physician recommends that you continue on your current medications as directed. Please refer to the Current Medication list given to you today.  *If you need a refill on your cardiac medications before your next appointment, please call your pharmacy*   Follow-Up: At [facility], you and your health needs are our priority.  As part of our continuing mission to provide you with exceptional heart care, we have created designated Provider Care Teams.  These Care Teams include your primary Cardiologist (physician) and Advanced Practice Providers (APPs -  Physician Assistants and Nurse Practitioners) who all work together to provide you with the care you need, when you need it.  We recommend signing up for the patient portal called "MyChart".  Sign up information is provided on this After Visit Summary.  MyChart is used to connect with patients for Virtual Visits (Telemedicine).  Patients are able to view lab/test results, encounter notes, upcoming appointments, etc.  Non-urgent messages can be sent to your provider as well.   To learn more about what you can do with MyChart, go to [email or web address].    Your next appointment:   6 month(s)  Provider:   Fransico Him, MD     Other Instructions Consider getting a pulse ox and keeping an eye on HR a couple times a week. If it is consistently staying over 100bpm, let us know!

## 2023-03-16 NOTE — Progress Notes (Signed)
Office Visit    Patient Name: Christina Morgan Date of Encounter: 03/19/2023  PCP:  [facility]   [facility]  Cardiologist:  Armanda Magicraci Turner, MD  Advanced Practice Provider:  No care team member to display Electrophysiologist:  Will Jorja LoaMartin Camnitz, MD      Chief Complaint    Christina Morgan is a 89 y.o. female with a hx of paroxysmal atrial fibrillation on Eliquis and flecainide, hypertension, hyperlipidemia, lower extremity edema, diastolic heart failure with moderate aortic regurgitation, osteopenia, CKD 3, symptomatic bradycardia presents today for heart failure follow-up  Past Medical History    Past Medical History:  Diagnosis Date   Cancer    endometrial ca   Chronic diastolic CHF (congestive heart failure)    Chronic edema    CKD (chronic kidney disease), stage III    Dyslipidemia    Fracture of left humerus    History of cardiovascular stress test    Lexiscan Myoview (06/2014): No ischemia or scar, not gated, low risk   Hx of echocardiogram    Echo (05/02/14): EF 60% to 65%. No regional wall motion abnormalities. Mild AI, mildly dilated aortic root (37 mm), trivial MR, trivial TR   Hyperlipidemia    Hypertension    Lipoma of skin    multiple   Osteopenia    PAF (paroxysmal atrial fibrillation)    failed DCCV 05/2014 >> Flecainide started >> DCCV 7/15 sucessful;  f/u ETT neg for pro-arrhythmia >> recurrent AF/AFL >> Flecainide inc to 100 bid with repeat DCCV 9/15   Vitamin D deficiency 09/25/2019   Past Surgical History:  Procedure Laterality Date   CARDIOVERSION N/A 06/05/2014   Procedure: CARDIOVERSION;  Surgeon: Quintella Reichertraci R Turner, MD;  Location: [facility] ENDOSCOPY;  Service: Cardiovascular;  Laterality: N/A;   CARDIOVERSION N/A 07/11/2014   Procedure: CARDIOVERSION;  Surgeon: Quintella Reichertraci R Turner, MD;  Location: [facility] ENDOSCOPY;  Service: Cardiovascular;  Laterality: N/A;   CARDIOVERSION N/A 09/04/2014   Procedure: CARDIOVERSION;   Surgeon: Quintella Reichertraci R Turner, MD;  Location: [facility] ENDOSCOPY;  Service: Cardiovascular;  Laterality: N/A;   CARDIOVERSION N/A 05/04/2022   Procedure: CARDIOVERSION;  Surgeon: Little IshikawaSchumann, Christopher L, MD;  [facility];  Service: Cardiovascular;  Laterality: N/A;   COLONOSCOPY  2008   COLONOSCOPY N/A 05/20/2015   Procedure: COLONOSCOPY;  Surgeon: Bernette Redbirdobert Buccini, MD;  Location: [facility] ENDOSCOPY;  Service: Endoscopy;  Laterality: N/A;   FEMUR IM NAIL Right 09/24/2019   Procedure: INTRAMEDULLARY (IM) NAIL FEMORAL;  Surgeon: Myrene GalasHandy, Michael, MD;  Location: [facility] OR;  Service: Orthopedics;  Laterality: Right;   IR KYPHO EA ADDL LEVEL THORACIC OR LUMBAR  04/12/2019   IR KYPHO THORACIC WITH BONE BIOPSY  04/12/2019   ROBOTIC ASSISTED SUPRACERVICAL HYSTERECTOMY WITH BILATERAL SALPINGO OOPHERECTOMY  11/06/2012   and bilateral pelvic lymph node dissection    Allergies  Allergies  Allergen Reactions   Penicillins Itching and Other (See Comments)    Dizziness to the patient of passing out- told by MD to "never take this again" Did it involve swelling of the face/tongue/throat, SOB, or low BP? No Did it involve sudden or severe rash/hives, skin peeling, or any reaction on the inside of your mouth or nose? No Did you need to seek medical attention at a hospital or doctor's office? No When did it last happen? "a long time ago"    If all above answers are "NO", may proceed with cephalosporin use.     History of Present Illness  Christina Morgan is a 89 y.o. female with a hx of paroxysmal atrial fibrillation on Eliquis and flecainide, hypertension, hyperlipidemia, lower extremity edema, diastolic heart failure with moderate aortic regurgitation, osteopenia, CKD 3, symptomatic bradycardia last seen 11/25/2022  Admitted 11/30/2021 with bradycardia.  Atenolol clonidine discontinued.  Amlodipine added for blood pressure control. She tested positive for COVID-19 12/11/2021. She was seen in the ED 12/15/2021 and  underwent cardioversion. Seen in follow-up 12/24/2021  maintaining sinus rhythm.  Admitted 01/02/2022 with A-fib with RVR converted to sinus bradycardia as low as the 20s beta-blocker discontinued and flecainide was transition to amiodarone.  Echo with EF 55 to 60%.  Admitted 03/06/2022 with acute on chronic diastolic heart failure.  Underwent cardioversion 05/04/2022.  At follow-up with A-fib clinic 05/26/2022 she was in recurrent atrial fibrillation.  Plan for rate control strategy and as such amiodarone discontinued.  3-day monitor 08/29/2022 with average heart rate 95 bpm in atrial fibrillation.  Last seen 11/25/2022 doing well and was euvolemic on torsemide 10 mg 3 times per week  Today for follow-up with her daughter from her SNF.  Feeling overall well since last seen.  Enjoys going to activities at her SNF.  Notes her lower extremity edema has continued to improve since last visit.  SNF staff assist her with compression socks and wraps.  No chest pain, stable mild exertional dyspnea.  EKGs/Labs/Other Studies Reviewed:   The following studies were reviewed today:  Echo 02/12/20   1. Left ventricular ejection fraction, by estimation, is 60 to 65%. The  left ventricle has normal function. The left ventricle has no regional  wall motion abnormalities. Left ventricular diastolic parameters are  consistent with Grade II diastolic  dysfunction (pseudonormalization).   2. Left atrial size was mildly dilated.   3. The mitral valve is normal in structure and function. No evidence of  mitral valve regurgitation. No evidence of mitral stenosis.   4. The aortic valve is tricuspid. Aortic valve regurgitation is mild.  Mild to moderate aortic valve sclerosis/calcification is present, without  any evidence of aortic stenosis.   5. Tricuspid regurgitation signal is inadequate for assessing PA  pressure.   6. The inferior vena cava is normal in size with greater than 50%  respiratory variability, suggesting  right atrial pressure of 3 mmHg.   7. Technically difficult study with poor acoustic windows.    CHA2DS2-VASc Score = 5   This indicates a 7.2% annual risk of stroke. The patient's score is based upon: CHF History: 1 HTN History: 1 Diabetes History: 0 Stroke History: 0 Vascular Disease History: 0 Age Score: 2 Gender Score: 1     EKG: EKG ordered today. EKG performed today demonstrates atrial fibrillation 124 bpm with no acute ST/T wave changes.  Recent Labs: 05/04/2022: BUN 22; Creatinine, Ser 1.80; Hemoglobin 10.9; Potassium 3.7; Sodium 143  Recent Lipid Panel    Component Value Date/Time   CHOL 175 03/21/2016 0915   TRIG 155 (H) 03/21/2016 0915   HDL 63 03/21/2016 0915   CHOLHDL 2.8 03/21/2016 0915   VLDL 31 (H) 03/21/2016 0915   LDLCALC 81 03/21/2016 0915    Risk Assessment/Calculations:   CHA2DS2-VASc Score = 5   This indicates a 7.2% annual risk of stroke. The patient's score is based upon: CHF History: 1 HTN History: 1 Diabetes History: 0 Stroke History: 0 Vascular Disease History: 0 Age Score: 2 Gender Score: 1    Home Medications   Current Meds  Medication Sig   amLODipine (NORVASC)  10 MG tablet Take 1 tablet (10 mg total) by mouth daily.   apixaban (ELIQUIS) 2.5 MG TABS tablet Take 1 tablet (2.5 mg total) by mouth 2 (two) times daily.   atorvastatin (LIPITOR) 10 MG tablet Take 1 tablet (10 mg total) by mouth at bedtime.   Cholecalciferol (VITAMIN D) 125 MCG (5000 UT) CAPS Take 5,000 Units by mouth daily.   hydrALAZINE (APRESOLINE) 50 MG tablet Take 1 tablet (50 mg total) by mouth every 8 (eight) hours.   letrozole (FEMARA) 2.5 MG tablet Take 2.5 mg by mouth daily.   Multiple Vitamin (DAILY VITE PO) Take 1 tablet by mouth daily.   omeprazole (PRILOSEC) 20 MG capsule Take 20 mg by mouth daily.   polyethylene glycol (MIRALAX / GLYCOLAX) 17 g packet Take 17 g by mouth daily as needed (constipation).   Skin Protectants, Misc. (CALAZIME SKIN PROTECTANT)  PSTE Apply 1 application. topically in the morning and at bedtime. Apply to sacrum   torsemide (DEMADEX) 10 MG tablet Take 10 mg by mouth every Monday, Wednesday, and Friday.   torsemide (DEMADEX) 10 MG tablet Take 10 mg by mouth daily as needed (weight gain of 5lbs in one week or 3lbs overnight.). Take in conjunction with MWF dose   vitamin B-12 (CYANOCOBALAMIN) 250 MCG tablet Take 250 mcg by mouth every other day.   vitamin C (VITAMIN C) 500 MG tablet Take 1 tablet (500 mg total) by mouth daily.     Review of Systems      All other systems reviewed and are otherwise negative except as noted above.  Physical Exam    VS:  BP 138/64   Pulse 97   Ht 5\' 4"  (1.626 m)   Wt 165 lb 12.8 oz (75.2 kg)   BMI 28.46 kg/m  , BMI Body mass index is 28.46 kg/m.  Wt Readings from Last 3 Encounters:  03/16/23 165 lb 12.8 oz (75.2 kg)  11/25/22 159 lb (72.1 kg)  08/29/22 160 lb (72.6 kg)     GEN: Well nourished, well developed, in no acute distress. HEENT: normal. Neck: Supple, no JVD, carotid bruits, or masses. Cardiac: RRR, no murmurs, rubs, or gallops. No clubbing, cyanosis, edema.  Radials/PT 2+ and equal bilaterally.  Respiratory:  Respirations regular and unlabored, clear to auscultation bilaterally. GI: Soft, nontender, nondistended. MS: No deformity or atrophy. Skin: Warm and dry, no rash. Neuro:  Strength and sensation are intact. Psych: Normal affect.  Assessment & Plan    PAF / Chronic anticoagulation -Continue rate control as she is asymptomatic. Initial HR 124 bpm but improved to 97 bpm with rest. Suggested getting pulse oximeter for home monitoring. Beta-blocker previously discontinued due to bradycardia.  Continue Eliquis 2.5 mg twice daily.  Reduced dose due to age, renal function. CHA2DS2-VASc Score = 5 [CHF History: 1, HTN History: 1, Diabetes History: 0, Stroke History: 0, Vascular Disease History: 0, Age Score: 2, Gender Score: 1].  Therefore, the patient's annual risk of  stroke is 7.2 %.    Denies bleeding complications.  HLD - Continue Lipitor 10mg  QD.  Denies myalgias  Chronic diastolic heart failure - Euvolemic and well compensated on exam.  Continue torsemide 10 mg 3 times per week.  Will request SNF update her compression wraps as they are reducing swelling but velcro appears to be wearing down.   HTN - BP reasonably well controlled. Continue current antihypertensive regimen.    Disposition: Follow up in 6 month(s) with Armanda Magic, MD or APP.  Signed, Luther Parody  Tilman NeatS Cletus Paris, NP 03/19/2023, 4:47 PM [facility]

## 2023-03-19 ENCOUNTER — Encounter (HOSPITAL_BASED_OUTPATIENT_CLINIC_OR_DEPARTMENT_OTHER): Payer: Self-pay | Admitting: Family

## 2023-03-30 ENCOUNTER — Telehealth (HOSPITAL_BASED_OUTPATIENT_CLINIC_OR_DEPARTMENT_OTHER): Payer: Self-pay | Admitting: Family

## 2023-03-30 DIAGNOSIS — I10 Essential (primary) hypertension: Secondary | ICD-10-CM

## 2023-03-30 NOTE — Telephone Encounter (Signed)
Labs dated 03/16/2023 received after her clinic visit as follows:   WBC 3.9, hemoglobin 12.1, hematocrit 36, platelet 187 Total cholesterol 155, triglycerides 84, LDL 58, HDL 80 A1c 5.6 Vitamin D 79.4 TSH 4.13 Potassium 3.8, creatinine 1.3, GFR 39.6, ALT 11, AST 17  Her creatinine is now <1.5. As such, no longer qualifies for reduced dose Eliquis. However, this is unusually low for her creatinine.  Discussed with pharmacy team.   Will as SNF to collect repeat BMP around 04/15/23 to reassess kidney function prior to changing Eliquis dose.   Alver Sorrow, NP

## 2023-03-31 ENCOUNTER — Encounter (HOSPITAL_BASED_OUTPATIENT_CLINIC_OR_DEPARTMENT_OTHER): Payer: Self-pay

## 2023-03-31 NOTE — Addendum Note (Signed)
Addended by: Marlene Lard on: 03/31/2023 08:39 AM   Modules accepted: Orders

## 2023-03-31 NOTE — Telephone Encounter (Signed)
Called [facility] at the request of NP, their fax number is [phone] Jenna Luo), will fax repeat BMP order with our fax number to send results.

## 2023-04-13 LAB — BASIC METABOLIC PANEL
BUN/Creatinine Ratio: 16 (ref 12–28)
BUN: 23 mg/dL (ref 8–27)
CO2: 22 mmol/L (ref 20–29)
Calcium: 9.1 mg/dL (ref 8.7–10.3)
Chloride: 105 mmol/L (ref 96–106)
Creatinine, Ser: 1.48 mg/dL — ABNORMAL HIGH (ref 0.57–1.00)
Glucose: 131 mg/dL — ABNORMAL HIGH (ref 70–99)
Potassium: 4.2 mmol/L (ref 3.5–5.2)
Sodium: 143 mmol/L (ref 134–144)
eGFR: 34 mL/min/{1.73_m2} — ABNORMAL LOW (ref >59)

## 2023-09-19 ENCOUNTER — Encounter (HOSPITAL_BASED_OUTPATIENT_CLINIC_OR_DEPARTMENT_OTHER): Payer: Self-pay | Admitting: Family

## 2023-09-19 ENCOUNTER — Ambulatory Visit (HOSPITAL_BASED_OUTPATIENT_CLINIC_OR_DEPARTMENT_OTHER): Payer: Medicare Other | Admitting: Family

## 2023-09-19 VITALS — BP 138/76 | HR 103 | Ht 64.0 in | Wt 169.6 lb

## 2023-09-19 DIAGNOSIS — D6859 Other primary thrombophilia: Secondary | ICD-10-CM

## 2023-09-19 DIAGNOSIS — I4819 Other persistent atrial fibrillation: Secondary | ICD-10-CM | POA: Diagnosis not present

## 2023-09-19 DIAGNOSIS — I10 Essential (primary) hypertension: Secondary | ICD-10-CM

## 2023-09-19 DIAGNOSIS — E782 Mixed hyperlipidemia: Secondary | ICD-10-CM

## 2023-09-19 DIAGNOSIS — I5032 Chronic diastolic (congestive) heart failure: Secondary | ICD-10-CM

## 2023-09-19 NOTE — Progress Notes (Signed)
Cardiology Office Note:  .   Date:  09/19/2023  ID:  Christina Morgan, DOB 10-Oct-1933, MRN [id number] PCP: Almetta Lovely, [facility] Provider Cardiologist:  Armanda Magic, MD Cardiology APP:  Alver Sorrow, NP  Electrophysiologist:  Regan Lemming, MD    History of Present Illness: .   Christina Morgan is a 89 y.o. female with a hx of paroxysmal atrial fibrillation on Eliquis and flecainide, hypertension, hyperlipidemia, lower extremity edema, diastolic heart failure with moderate aortic regurgitation, osteopenia, CKD 3, symptomatic bradycardia.   Admitted 11/30/2021 with bradycardia.  Atenolol clonidine discontinued.  Amlodipine added for blood pressure control. She tested positive for COVID-19 12/11/2021. She was seen in the ED 12/15/2021 and underwent cardioversion. Seen in follow-up 12/24/2021  maintaining sinus rhythm.  Admitted 01/02/2022 with A-fib with RVR converted to sinus bradycardia as low as the 20s beta-blocker discontinued and flecainide was transition to amiodarone.  Echo with EF 55 to 60%.  Admitted 03/06/2022 with acute on chronic diastolic heart failure.  Underwent cardioversion 05/04/2022.  At follow-up with A-fib clinic 05/26/2022 she was in recurrent atrial fibrillation.  Plan for rate control strategy and as such amiodarone discontinued.  3-day monitor 08/29/2022 with average heart rate 95 bpm in atrial fibrillation.  Seen 11/25/2022 and 03/2023 doing well and was euvolemic on torsemide 10 mg 3 times per week   Today for follow-up with her daughter from her SNF.  Feeling overall well since last seen.  Enjoys going to activities at her SNF such as "noodle ball". LE edema well controlled with compression stockings and wraps. No chest pain, stable mild exertional dyspnea.  ROS: Please see the history of present illness.    All other systems reviewed and are negative.   Studies Reviewed: .        Cardiac Studies & Procedures     STRESS  TESTS  MYOCARDIAL PERFUSION IMAGING 01/10/2019  Narrative  Nuclear stress EF: 62%.  The study is normal.  This is a low risk study.  There was no ST segment deviation noted during stress.  No T wave inversion was noted during stress.  Low risk stress nuclear study with normal perfusion and normal left ventricular regional and global systolic function.   ECHOCARDIOGRAM  ECHOCARDIOGRAM COMPLETE 01/03/2022  Narrative ECHOCARDIOGRAM REPORT    Patient Name:   Christina Morgan Date of Exam: 01/03/2022 Medical Rec #:  [id number]           Height:       66.0 in Accession #:    [id number]          Weight:       175.9 lb Date of Birth:  1933/02/23            BSA:          1.894 m Patient Age:    88 years            BP:           128/57 mmHg Patient Gender: F                   HR:           61 bpm. Exam Location:  Inpatient  Procedure: 2D Echo, Cardiac Doppler, Color Doppler and Intracardiac Opacification Agent  Indications:    R94.31 Abnormal EKG  History:        Patient has prior history of Echocardiogram examinations, most recent 02/12/2020. CHF, Abnormal ECG, Arrythmias:Atrial Fibrillation and Bradycardia,  Signs/Symptoms:Shortness of Breath, Dyspnea and Bacteremia; Risk Factors:Hypertension and Dyslipidemia. Cancer.  Sonographer:    Sheralyn Boatman RDCS Referring Phys: [id number] [facility] [place] [facility]   Sonographer Comments: Technically difficult study due to poor echo windows. Image acquisition challenging due to patient body habitus. IMPRESSIONS   1. Abnormal septal motion. Left ventricular ejection fraction, by estimation, is 55 to 60%. The left ventricle has normal function. The left ventricle has no regional wall motion abnormalities. There is mild left ventricular hypertrophy. Left ventricular diastolic parameters are indeterminate. 2. Right ventricular systolic function is moderately reduced. The right ventricular size is moderately enlarged. There is moderately elevated  pulmonary artery systolic pressure. 3. The mitral valve is degenerative. Trivial mitral valve regurgitation. No evidence of mitral stenosis. 4. AV severely thickened and calcified now with mild stenosis compared to TTE done 02/12/20 There contiues to also be some shadowing artifact in LVOT doubt vegetation and was present on TTE 2021 . The aortic valve is tricuspid. There is severe calcifcation of the aortic valve. There is severe thickening of the aortic valve. Aortic valve regurgitation is mild to moderate. Mild aortic valve stenosis. 5. Aortic dilatation noted. There is mild dilatation of the ascending aorta, measuring 39 mm. 6. The inferior vena cava is normal in size with greater than 50% respiratory variability, suggesting right atrial pressure of 3 mmHg.  FINDINGS Left Ventricle: Abnormal septal motion. Left ventricular ejection fraction, by estimation, is 55 to 60%. The left ventricle has normal function. The left ventricle has no regional wall motion abnormalities. Definity contrast agent was given IV to delineate the left ventricular endocardial borders. The left ventricular internal cavity size was normal in size. There is mild left ventricular hypertrophy. Left ventricular diastolic parameters are indeterminate.  Right Ventricle: The right ventricular size is moderately enlarged. Right vetricular wall thickness was not assessed. Right ventricular systolic function is moderately reduced. There is moderately elevated pulmonary artery systolic pressure. The tricuspid regurgitant velocity is 3.32 m/s, and with an assumed right atrial pressure of 15 mmHg, the estimated right ventricular systolic pressure is 59.1 mmHg.  Left Atrium: Left atrial size was normal in size.  Right Atrium: Right atrial size was normal in size.  Pericardium: There is no evidence of pericardial effusion.  Mitral Valve: The mitral valve is degenerative in appearance. There is moderate thickening of the mitral valve  leaflet(s). There is moderate calcification of the mitral valve leaflet(s). Trivial mitral valve regurgitation. No evidence of mitral valve stenosis.  Tricuspid Valve: The tricuspid valve is normal in structure. Tricuspid valve regurgitation is mild . No evidence of tricuspid stenosis.  Aortic Valve: AV severely thickened and calcified now with mild stenosis compared to TTE done 02/12/20 There contiues to also be some shadowing artifact in LVOT doubt vegetation and was present on TTE 2021. The aortic valve is tricuspid. There is severe calcifcation of the aortic valve. There is severe thickening of the aortic valve. Aortic valve regurgitation is mild to moderate. Aortic regurgitation PHT measures 426 msec. Mild aortic stenosis is present. Aortic valve mean gradient measures 6.0 mmHg. Aortic valve peak gradient measures 12.4 mmHg. Aortic valve area, by VTI measures 1.74 cm.  Pulmonic Valve: The pulmonic valve was normal in structure. Pulmonic valve regurgitation is mild. No evidence of pulmonic stenosis.  Aorta: Aortic dilatation noted. There is mild dilatation of the ascending aorta, measuring 39 mm.  Venous: The inferior vena cava is normal in size with greater than 50% respiratory variability, suggesting right atrial pressure of 3 mmHg.  IAS/Shunts: No atrial level shunt detected by color flow Doppler.   LEFT VENTRICLE PLAX 2D LVIDd:         4.60 cm      Diastology LVIDs:         3.00 cm      LV e' medial:    7.01 cm/s LV PW:         1.10 cm      LV E/e' medial:  19.0 LV IVS:        1.20 cm      LV e' lateral:   7.76 cm/s LVOT diam:     1.80 cm      LV E/e' lateral: 17.1 LV SV:         69 LV SV Index:   36 LVOT Area:     2.54 cm  LV Volumes (MOD) LV vol d, MOD A2C: 113.0 ml LV vol d, MOD A4C: 103.6 ml LV vol s, MOD A2C: 40.0 ml LV vol s, MOD A4C: 34.3 ml LV SV MOD A2C:     73.0 ml LV SV MOD A4C:     103.6 ml LV SV MOD BP:      68.8 ml  RIGHT VENTRICLE             IVC RV S  prime:     17.70 cm/s  IVC diam: 2.00 cm TAPSE (M-mode): 2.8 cm  LEFT ATRIUM             Index        RIGHT ATRIUM           Index LA diam:        2.90 cm 1.53 cm/m   RA Area:     16.90 cm LA Vol (A2C):   47.7 ml 25.19 ml/m  RA Volume:   42.50 ml  22.44 ml/m LA Vol (A4C):   70.1 ml 37.02 ml/m LA Biplane Vol: 61.0 ml 32.21 ml/m AORTIC VALVE                     PULMONIC VALVE AV Area (Vmax):    1.85 cm      PR End Diast Vel: 1.75 msec AV Area (Vmean):   1.66 cm AV Area (VTI):     1.74 cm AV Vmax:           176.00 cm/s AV Vmean:          115.000 cm/s AV VTI:            0.396 m AV Peak Grad:      12.4 mmHg AV Mean Grad:      6.0 mmHg LVOT Vmax:         128.00 cm/s LVOT Vmean:        75.100 cm/s LVOT VTI:          0.271 m LVOT/AV VTI ratio: 0.68 AI PHT:            426 msec  AORTA Ao Root diam: 3.60 cm Ao Asc diam:  3.90 cm  MITRAL VALVE                TRICUSPID VALVE MV Area (PHT): 4.80 cm     TR Peak grad:   44.1 mmHg MV Decel Time: 158 msec     TR Vmax:        332.00 cm/s MV E velocity: 133.00 cm/s MV A velocity: 71.00 cm/s   SHUNTS MV E/A ratio:  1.87  Systemic VTI:  0.27 m Systemic Diam: 1.80 cm  Charlton Haws MD Electronically signed by Charlton Haws MD Signature Date/Time: 01/03/2022/3:31:04 PM    Final    MONITORS  LONG TERM MONITOR (3-14 DAYS) 09/15/2022  Narrative Patch Wear Time:  3 days and 0 hours  Max 203 bpm 04:31pm, 09/27 Min 59 bpm 03:35am, 09/29 Avg 95 bpm 100% atrial fibrillation burden Less than 1% ventricular ectopy No triggered episodes reported  Will Camnitz, MD           Risk Assessment/Calculations:    CHA2DS2-VASc Score = 5   This indicates a 7.2% annual risk of stroke. The patient's score is based upon: CHF History: 1 HTN History: 1 Diabetes History: 0 Stroke History: 0 Vascular Disease History: 0 Age Score: 2 Gender Score: 1            Physical Exam:   VS:  BP 138/76   Pulse (!) 103   Ht 5\' 4"  (1.626 m)    Wt 169 lb 9.6 oz (76.9 kg)   SpO2 94%   BMI 29.11 kg/m    Wt Readings from Last 3 Encounters:  09/19/23 169 lb 9.6 oz (76.9 kg)  03/16/23 165 lb 12.8 oz (75.2 kg)  11/25/22 159 lb (72.1 kg)    GEN: Well nourished, well developed in no acute distress NECK: No JVD; No carotid bruits CARDIAC: IRIR, no murmurs, rubs, gallops RESPIRATORY:  Clear to auscultation without rales, wheezing or rhonchi  ABDOMEN: Soft, non-tender, non-distended EXTREMITIES:  No edema; No deformity   ASSESSMENT AND PLAN: .   PAF / Chronic anticoagulation -Continue rate control as she is asymptomatic.  Her a minimally elevated 103 bpm.  Reports readings at SNF has not been as elevated.  Will ask her SNF to contact us if heart rate routinely greater than 100 bpm at which time beta-blocker would be indicated. CHA2DS2-VASc Score = 5 [CHF History: 1, HTN History: 1, Diabetes History: 0, Stroke History: 0, Vascular Disease History: 0, Age Score: 2, Gender Score: 1].  Therefore, the patient's annual risk of stroke is 7.2 %.    Continue Eliquis 2.5 mg twice daily.  Reduced dose due to age, renal function.  Update CBC, BMP.   HLD - Continue Lipitor 10mg  QD.  Denies myalgias   Chronic diastolic heart failure - Euvolemic and well compensated on exam.  Continue torsemide 10 mg 3 times per week.Continue compression stockings and wraps.   HTN - BP reasonably well controlled. BP goal <140/90 as felt poorly with lower pressures previously. Continue current antihypertensive regimen.         Dispo: follow up in 6 months  Signed, Alver Sorrow, NP

## 2023-09-19 NOTE — Patient Instructions (Addendum)
Medication Instructions:   Continue your current medications.   *If you need a refill on your cardiac medications before your next appointment, please call your pharmacy*   Lab Work: Your physician recommends that you return for lab work today: CBC, BMP  If you have labs (blood work) drawn today and your tests are completely normal, you will receive your results only by: MyChart Message (if you have MyChart) OR A paper copy in the mail If you have any lab test that is abnormal or we need to change your treatment, we will call you to review the results.  Follow-Up: At [facility], you and your health needs are our priority.  As part of our continuing mission to provide you with exceptional heart care, we have created designated Provider Care Teams.  These Care Teams include your primary Cardiologist (physician) and Advanced Practice Providers (APPs -  Physician Assistants and Nurse Practitioners) who all work together to provide you with the care you need, when you need it.  We recommend signing up for the patient portal called "MyChart".  Sign up information is provided on this After Visit Summary.  MyChart is used to connect with patients for Virtual Visits (Telemedicine).  Patients are able to view lab/test results, encounter notes, upcoming appointments, etc.  Non-urgent messages can be sent to your provider as well.   To learn more about what you can do with MyChart, go to [email or web address].    Your next appointment:   6 month(s)  Provider:   Gillian Shields, NP    Other Instructions  Please contact cardiology (phone [phone] or fax [phone]) if heart rate consistently >100 bpm or blood pressure consistently >140/90

## 2023-09-20 LAB — CBC
Hematocrit: 36.9 % (ref 34.0–46.6)
Hemoglobin: 11.6 g/dL (ref 11.1–15.9)
MCH: 27.9 pg (ref 26.6–33.0)
MCHC: 31.4 g/dL — ABNORMAL LOW (ref 31.5–35.7)
MCV: 89 fL (ref 79–97)
Platelets: 293 10*3/uL (ref 150–450)
RBC: 4.16 x10E6/uL (ref 3.77–5.28)
RDW: 14.4 % (ref 11.7–15.4)
WBC: 5 10*3/uL (ref 3.4–10.8)

## 2023-09-20 LAB — BASIC METABOLIC PANEL
BUN/Creatinine Ratio: 17 (ref 12–28)
BUN: 22 mg/dL (ref 8–27)
CO2: 23 mmol/L (ref 20–29)
Calcium: 9.3 mg/dL (ref 8.7–10.3)
Chloride: 104 mmol/L (ref 96–106)
Creatinine, Ser: 1.29 mg/dL — ABNORMAL HIGH (ref 0.57–1.00)
Glucose: 95 mg/dL (ref 70–99)
Potassium: 3.9 mmol/L (ref 3.5–5.2)
Sodium: 144 mmol/L (ref 134–144)
eGFR: 40 mL/min/{1.73_m2} — ABNORMAL LOW (ref >59)

## 2023-09-21 ENCOUNTER — Telehealth: Payer: Self-pay | Admitting: Family

## 2023-09-21 DIAGNOSIS — D6859 Other primary thrombophilia: Secondary | ICD-10-CM

## 2023-09-21 DIAGNOSIS — I4819 Other persistent atrial fibrillation: Secondary | ICD-10-CM

## 2023-09-21 MED ORDER — APIXABAN 5 MG PO TABS: 5.0000 mg | ORAL_TABLET | Freq: Two times a day (BID) | ORAL | 3 refills | Status: DC

## 2023-09-21 NOTE — Telephone Encounter (Signed)
Left message for patient to call back     "Kidney function continues to improve. CBC with no evidence of anemia nor infection.     Given present kidney function, change Eliquis to 5mg  BID. As kidney function improved, the higher dose is needed to adequately protect her from stroke from her atrial fibrillation. Recommend repeat BMP in 3 months for monitoring.   Please fax results and recommendations to [facility] Attn: Ilsa Iha, FNP."

## 2023-09-21 NOTE — Telephone Encounter (Signed)
Pt daughter returning call for lab results

## 2023-09-21 NOTE — Addendum Note (Signed)
Addended by: Marlene Lard on: 09/21/2023 08:59 AM   Modules accepted: Orders

## 2023-09-21 NOTE — Telephone Encounter (Signed)
Rx & labs printed to be faxed to facility, per Regis Bill, call transferred from call center and daughter given results and verbalizes understanding.

## 2023-11-08 ENCOUNTER — Telehealth: Payer: Self-pay | Admitting: Family

## 2023-11-08 ENCOUNTER — Emergency Department (HOSPITAL_BASED_OUTPATIENT_CLINIC_OR_DEPARTMENT_OTHER): Payer: Medicare Other

## 2023-11-08 ENCOUNTER — Other Ambulatory Visit: Payer: Self-pay

## 2023-11-08 ENCOUNTER — Encounter (HOSPITAL_BASED_OUTPATIENT_CLINIC_OR_DEPARTMENT_OTHER): Payer: Self-pay

## 2023-11-08 ENCOUNTER — Inpatient Hospital Stay (HOSPITAL_BASED_OUTPATIENT_CLINIC_OR_DEPARTMENT_OTHER)
Admission: EM | Admit: 2023-11-08 | Discharge: 2023-11-22 | DRG: 291 | Disposition: A | Discharge status: Hospice | Payer: Medicare Other | Source: Skilled Nursing Facility | Attending: Internal Medicine | Admitting: Internal Medicine

## 2023-11-08 DIAGNOSIS — E440 Moderate protein-calorie malnutrition: Secondary | ICD-10-CM | POA: Diagnosis present

## 2023-11-08 DIAGNOSIS — Z8542 Personal history of malignant neoplasm of other parts of uterus: Secondary | ICD-10-CM | POA: Diagnosis not present

## 2023-11-08 DIAGNOSIS — I272 Pulmonary hypertension, unspecified: Secondary | ICD-10-CM | POA: Diagnosis present

## 2023-11-08 DIAGNOSIS — J9601 Acute respiratory failure with hypoxia: Secondary | ICD-10-CM | POA: Diagnosis present

## 2023-11-08 DIAGNOSIS — I130 Hypertensive heart and chronic kidney disease with heart failure and stage 1 through stage 4 chronic kidney disease, or unspecified chronic kidney disease: Secondary | ICD-10-CM | POA: Diagnosis present

## 2023-11-08 DIAGNOSIS — I4891 Unspecified atrial fibrillation: Secondary | ICD-10-CM

## 2023-11-08 DIAGNOSIS — Z7901 Long term (current) use of anticoagulants: Secondary | ICD-10-CM

## 2023-11-08 DIAGNOSIS — Z515 Encounter for palliative care: Secondary | ICD-10-CM | POA: Diagnosis not present

## 2023-11-08 DIAGNOSIS — I10 Essential (primary) hypertension: Secondary | ICD-10-CM | POA: Diagnosis not present

## 2023-11-08 DIAGNOSIS — Z8249 Family history of ischemic heart disease and other diseases of the circulatory system: Secondary | ICD-10-CM | POA: Diagnosis not present

## 2023-11-08 DIAGNOSIS — Z7189 Other specified counseling: Secondary | ICD-10-CM | POA: Diagnosis not present

## 2023-11-08 DIAGNOSIS — N136 Pyonephrosis: Secondary | ICD-10-CM | POA: Diagnosis present

## 2023-11-08 DIAGNOSIS — I4819 Other persistent atrial fibrillation: Secondary | ICD-10-CM | POA: Diagnosis present

## 2023-11-08 DIAGNOSIS — R680 Hypothermia, not associated with low environmental temperature: Secondary | ICD-10-CM | POA: Diagnosis not present

## 2023-11-08 DIAGNOSIS — Z79899 Other long term (current) drug therapy: Secondary | ICD-10-CM | POA: Diagnosis not present

## 2023-11-08 DIAGNOSIS — I5033 Acute on chronic diastolic (congestive) heart failure: Secondary | ICD-10-CM | POA: Diagnosis not present

## 2023-11-08 DIAGNOSIS — I480 Paroxysmal atrial fibrillation: Secondary | ICD-10-CM | POA: Diagnosis not present

## 2023-11-08 DIAGNOSIS — J810 Acute pulmonary edema: Secondary | ICD-10-CM | POA: Diagnosis present

## 2023-11-08 DIAGNOSIS — Z1152 Encounter for screening for COVID-19: Secondary | ICD-10-CM | POA: Diagnosis not present

## 2023-11-08 DIAGNOSIS — I351 Nonrheumatic aortic (valve) insufficiency: Secondary | ICD-10-CM | POA: Diagnosis present

## 2023-11-08 DIAGNOSIS — I493 Ventricular premature depolarization: Secondary | ICD-10-CM | POA: Diagnosis present

## 2023-11-08 DIAGNOSIS — N179 Acute kidney failure, unspecified: Secondary | ICD-10-CM | POA: Diagnosis present

## 2023-11-08 DIAGNOSIS — Z880 Allergy status to penicillin: Secondary | ICD-10-CM

## 2023-11-08 DIAGNOSIS — E785 Hyperlipidemia, unspecified: Secondary | ICD-10-CM | POA: Diagnosis present

## 2023-11-08 DIAGNOSIS — I509 Heart failure, unspecified: Secondary | ICD-10-CM | POA: Diagnosis not present

## 2023-11-08 DIAGNOSIS — N1832 Chronic kidney disease, stage 3b: Secondary | ICD-10-CM | POA: Diagnosis present

## 2023-11-08 DIAGNOSIS — Z79811 Long term (current) use of aromatase inhibitors: Secondary | ICD-10-CM

## 2023-11-08 DIAGNOSIS — G9341 Metabolic encephalopathy: Secondary | ICD-10-CM | POA: Diagnosis not present

## 2023-11-08 DIAGNOSIS — R627 Adult failure to thrive: Secondary | ICD-10-CM | POA: Diagnosis present

## 2023-11-08 DIAGNOSIS — I4892 Unspecified atrial flutter: Secondary | ICD-10-CM | POA: Diagnosis present

## 2023-11-08 DIAGNOSIS — E871 Hypo-osmolality and hyponatremia: Secondary | ICD-10-CM | POA: Diagnosis present

## 2023-11-08 DIAGNOSIS — Z66 Do not resuscitate: Secondary | ICD-10-CM | POA: Diagnosis present

## 2023-11-08 DIAGNOSIS — Z90711 Acquired absence of uterus with remaining cervical stump: Secondary | ICD-10-CM

## 2023-11-08 DIAGNOSIS — I5031 Acute diastolic (congestive) heart failure: Secondary | ICD-10-CM | POA: Diagnosis not present

## 2023-11-08 DIAGNOSIS — Z808 Family history of malignant neoplasm of other organs or systems: Secondary | ICD-10-CM

## 2023-11-08 DIAGNOSIS — K219 Gastro-esophageal reflux disease without esophagitis: Secondary | ICD-10-CM | POA: Diagnosis present

## 2023-11-08 LAB — RESP PANEL BY RT-PCR (RSV, FLU A&B, COVID)  RVPGX2
Influenza A by PCR: NEGATIVE
Influenza B by PCR: NEGATIVE
Resp Syncytial Virus by PCR: NEGATIVE
SARS Coronavirus 2 by RT PCR: NEGATIVE

## 2023-11-08 LAB — BRAIN NATRIURETIC PEPTIDE: B Natriuretic Peptide: 871.9 pg/mL — ABNORMAL HIGH (ref 0.0–100.0)

## 2023-11-08 LAB — CBC
HCT: 33.8 % — ABNORMAL LOW (ref 36.0–46.0)
Hemoglobin: 10.8 g/dL — ABNORMAL LOW (ref 12.0–15.0)
MCH: 26.5 pg (ref 26.0–34.0)
MCHC: 32 g/dL (ref 30.0–36.0)
MCV: 83 fL (ref 80.0–100.0)
Platelets: 345 10*3/uL (ref 150–400)
RBC: 4.07 MIL/uL (ref 3.87–5.11)
RDW: 16.3 % — ABNORMAL HIGH (ref 11.5–15.5)
WBC: 8.3 10*3/uL (ref 4.0–10.5)
nRBC: 0 % (ref 0.0–0.2)

## 2023-11-08 LAB — COMPREHENSIVE METABOLIC PANEL
ALT: 13 U/L (ref 0–44)
AST: 17 U/L (ref 15–41)
Albumin: 4.1 g/dL (ref 3.5–5.0)
Alkaline Phosphatase: 102 U/L (ref 38–126)
Anion gap: 14 (ref 5–15)
BUN: 21 mg/dL (ref 8–23)
CO2: 26 mmol/L (ref 22–32)
Calcium: 10 mg/dL (ref 8.9–10.3)
Chloride: 104 mmol/L (ref 98–111)
Creatinine, Ser: 1.29 mg/dL — ABNORMAL HIGH (ref 0.44–1.00)
GFR, Estimated: 40 mL/min — ABNORMAL LOW (ref >60)
Glucose, Bld: 124 mg/dL — ABNORMAL HIGH (ref 70–99)
Potassium: 3.6 mmol/L (ref 3.5–5.1)
Sodium: 144 mmol/L (ref 135–145)
Total Bilirubin: 0.7 mg/dL (ref <1.2)
Total Protein: 7 g/dL (ref 6.5–8.1)

## 2023-11-08 LAB — TSH: TSH: 2.585 u[IU]/mL (ref 0.350–4.500)

## 2023-11-08 LAB — TROPONIN I (HIGH SENSITIVITY)
Troponin I (High Sensitivity): 9 ng/L (ref <18)
Troponin I (High Sensitivity): 9 ng/L (ref <18)

## 2023-11-08 LAB — MAGNESIUM: Magnesium: 1.8 mg/dL (ref 1.7–2.4)

## 2023-11-08 MED ORDER — FUROSEMIDE 10 MG/ML IJ SOLN
40.0000 mg | Freq: Once | INTRAMUSCULAR | Status: AC
  Administered 2023-11-08: 40 mg via INTRAVENOUS
  Filled 2023-11-08: qty 4

## 2023-11-08 MED ORDER — SODIUM CHLORIDE 0.9% FLUSH
3.0000 mL | Freq: Two times a day (BID) | INTRAVENOUS | Status: DC
  Administered 2023-11-08 – 2023-11-21 (×26): 3 mL via INTRAVENOUS

## 2023-11-08 MED ORDER — DILTIAZEM LOAD VIA INFUSION
10.0000 mg | Freq: Once | INTRAVENOUS | Status: AC
  Administered 2023-11-08: 10 mg via INTRAVENOUS
  Filled 2023-11-08: qty 10

## 2023-11-08 MED ORDER — ACETAMINOPHEN 650 MG RE SUPP: 650.0000 mg | Freq: Four times a day (QID) | RECTAL | Status: DC | PRN

## 2023-11-08 MED ORDER — POTASSIUM CHLORIDE CRYS ER 20 MEQ PO TBCR
40.0000 meq | EXTENDED_RELEASE_TABLET | ORAL | Status: AC
  Administered 2023-11-08: 40 meq via ORAL
  Filled 2023-11-08: qty 2

## 2023-11-08 MED ORDER — DILTIAZEM HCL-DEXTROSE 125-5 MG/125ML-% IV SOLN (PREMIX)
5.0000 mg/h | INTRAVENOUS | Status: DC
  Administered 2023-11-08: 15 mg/h via INTRAVENOUS
  Administered 2023-11-08: 5 mg/h via INTRAVENOUS
  Administered 2023-11-09 – 2023-11-10 (×4): 15 mg/h via INTRAVENOUS
  Filled 2023-11-08 (×7): qty 125

## 2023-11-08 MED ORDER — ACETAMINOPHEN 325 MG PO TABS
650.0000 mg | ORAL_TABLET | Freq: Four times a day (QID) | ORAL | Status: DC | PRN
  Administered 2023-11-12: 650 mg via ORAL
  Filled 2023-11-08: qty 2

## 2023-11-08 MED ORDER — ALBUTEROL SULFATE (2.5 MG/3ML) 0.083% IN NEBU
2.5000 mg | INHALATION_SOLUTION | Freq: Four times a day (QID) | RESPIRATORY_TRACT | Status: DC | PRN
  Administered 2023-11-09 – 2023-11-10 (×3): 2.5 mg via RESPIRATORY_TRACT
  Filled 2023-11-08 (×4): qty 3

## 2023-11-08 MED ORDER — APIXABAN 5 MG PO TABS
5.0000 mg | ORAL_TABLET | Freq: Two times a day (BID) | ORAL | Status: DC
  Administered 2023-11-08 – 2023-11-11 (×6): 5 mg via ORAL
  Filled 2023-11-08 (×6): qty 1

## 2023-11-08 MED ORDER — FUROSEMIDE 10 MG/ML IJ SOLN
40.0000 mg | Freq: Two times a day (BID) | INTRAMUSCULAR | Status: DC
  Administered 2023-11-09: 40 mg via INTRAVENOUS
  Filled 2023-11-08: qty 4

## 2023-11-08 NOTE — Plan of Care (Signed)
Problem: Nutrition: Goal: Adequate nutrition will be maintained Outcome: Completed/Met   Problem: Nutrition: Goal: Adequate nutrition will be maintained Outcome: Completed/Met

## 2023-11-08 NOTE — Telephone Encounter (Signed)
Patient primary cardiologist is Dr Mayford Knife and Ronn Melena NP out of th office until 12/5 Will forward to [facility] triage

## 2023-11-08 NOTE — ED Triage Notes (Signed)
Pt presents from assisted living facility, BIB daughter, where she was seen by NP at facility yest., who increased her Torsemide to 20mg  for the next 5 days. Pt c/o SHOB, was eval at facility, pulse found to be 153.   Alos c/o increased R leg swelling and R foot pain. Hx A-fib, CHF

## 2023-11-08 NOTE — H&P (Signed)
History and Physical    Patient: Christina Morgan DGU:[id number] DOB: 02-22-1933 DOA: 11/08/2023 DOS: the patient was seen and examined on 11/08/2023 PCP: [facility]  Patient coming from: [facility]  Chief Complaint:  Chief Complaint  Patient presents with   Shortness of Breath   Tachycardia   HPI: Christina Morgan is a 89 y.o. female with medical history significant of hypertension, hyperlipidemia, paroxysmal atrial fibrillation, chronic diastolic CHF, CKD, who presents with complaints of shortness of breath  History is obtained mostly from her daughter who is present at bedside.  At baseline patient lives at [facility] assisted living facility ambulates with the use of a wheelchair.  She noted that her legs have been swelling in the right leg seem to have doubled in size over two days. Last week, she experienced swelling and foot pain, limiting weight-bearing. An x-ray at that time was unremarkable. Yesterday, she developed dyspnea on exertion, unable to propel her wheelchair without becoming short of breath. Her heart rate was noted to be 153, prompting a visit to [facility] ER. Her torsemide dose was increasedfrom 10 mg to 20 yesterday, with the first dose taken this morning. She was to take the doubled dose for the next 5 days. A Doppler ultrasound of the lower extremities showed no signs of deep vein thrombosis. The patient denies chest pain.  The patient typically sleeps in a recliner and is unable to lay flat. She has a history of cervical fracture two years ago and usually wears a cervical collar at night to sleep. The patient is not on any regular rate control medications but has a history of severe bradycardia with amiodarone use, resulting in heart rates in the 20s and 30s. In the emergency department patient was noted to be afebrile with pulse elevated up to 169 in atrial fibrillation, respirations 18-29, and all other vital signs relatively  maintained.  Labs significant for hemoglobin 10.8, BUN 21, creatinine 1.29, BNP 871.9.  Chest x-ray noted similar findings of cardiomegaly with pulmonary edema and small layering bilateral effusions.  Doppler ultrasound of bilateral lower extremities have been obtained and negative for any signs of DVT.  Review of Systems: {ROS_Text:26778} Past Medical History:  Diagnosis Date   Cancer (HCC)    endometrial ca   Chronic diastolic CHF (congestive heart failure) (HCC)    Chronic edema    CKD (chronic kidney disease), stage III (HCC)    Dyslipidemia    Fracture of left humerus    History of cardiovascular stress test    Lexiscan Myoview (06/2014): No ischemia or scar, not gated, low risk   Hx of echocardiogram    Echo (05/02/14): EF 60% to 65%. No regional wall motion abnormalities. Mild AI, mildly dilated aortic root (37 mm), trivial MR, trivial TR   Hyperlipidemia    Hypertension    Lipoma of skin    multiple   Osteopenia    PAF (paroxysmal atrial fibrillation) (HCC)    failed DCCV 05/2014 >> Flecainide started >> DCCV 7/15 sucessful;  f/u ETT neg for pro-arrhythmia >> recurrent AF/AFL >> Flecainide inc to 100 bid with repeat DCCV 9/15   Vitamin D deficiency 09/25/2019   Past Surgical History:  Procedure Laterality Date   CARDIOVERSION N/A 06/05/2014   Procedure: CARDIOVERSION;  Surgeon: Quintella Reichert, MD;  Location: [facility] ENDOSCOPY;  Service: Cardiovascular;  Laterality: N/A;   CARDIOVERSION N/A 07/11/2014   Procedure: CARDIOVERSION;  Surgeon: Quintella Reichert, MD;  Location: [facility] ENDOSCOPY;  Service:  Cardiovascular;  Laterality: N/A;   CARDIOVERSION N/A 09/04/2014   Procedure: CARDIOVERSION;  Surgeon: Quintella Reichert, MD;  Location: [facility] ENDOSCOPY;  Service: Cardiovascular;  Laterality: N/A;   CARDIOVERSION N/A 05/04/2022   Procedure: CARDIOVERSION;  Surgeon: Little Ishikawa, MD;  Location[facility] ENDOSCOPY;  Service: Cardiovascular;  Laterality: N/A;   COLONOSCOPY  2008   COLONOSCOPY N/A  05/20/2015   Procedure: COLONOSCOPY;  Surgeon: Bernette Redbird, MD;  Location: [facility] ENDOSCOPY;  Service: Endoscopy;  Laterality: N/A;   FEMUR IM NAIL Right 09/24/2019   Procedure: INTRAMEDULLARY (IM) NAIL FEMORAL;  Surgeon: Myrene Galas, MD;  Location: [facility] OR;  Service: Orthopedics;  Laterality: Right;   IR KYPHO EA ADDL LEVEL THORACIC OR LUMBAR  04/12/2019   IR KYPHO THORACIC WITH BONE BIOPSY  04/12/2019   ROBOTIC ASSISTED SUPRACERVICAL HYSTERECTOMY WITH BILATERAL SALPINGO OOPHERECTOMY  11/06/2012   and bilateral pelvic lymph node dissection   Social History:  reports that she has never smoked. She has been exposed to tobacco smoke. She has never used smokeless tobacco. She reports that she does not drink alcohol and does not use drugs.  Allergies  Allergen Reactions   Penicillins Itching and Other (See Comments)    Dizziness to the patient of passing out- told by MD to "never take this again" Did it involve swelling of the face/tongue/throat, SOB, or low BP? No Did it involve sudden or severe rash/hives, skin peeling, or any reaction on the inside of your mouth or nose? No Did you need to seek medical attention at a hospital or doctor's office? No When did it last happen? "a long time ago"    If all above answers are "NO", may proceed with cephalosporin use.     Family History  Problem Relation Age of Onset   Cancer Father 56       metastatic oropharyngeal ca   Heart attack Mother    Coronary artery disease Mother     Prior to Admission medications   Medication Sig Start Date End Date Taking? Authorizing Provider  diclofenac Sodium (VOLTAREN) 1 % GEL Apply 2 g topically 3 (three) times daily as needed. 10/27/23  Yes [provider]  acetaminophen (TYLENOL) 500 MG tablet Take 1,000 mg by mouth 3 (three) times daily as needed for moderate pain or mild pain.    [provider]  amLODipine (NORVASC) 10 MG tablet Take 1 tablet (10 mg total) by mouth daily. 11/28/22   Alver Sorrow, NP  apixaban (ELIQUIS) 5 MG TABS tablet Take 1 tablet (5 mg total) by mouth 2 (two) times daily. 09/21/23   Alver Sorrow, NP  atorvastatin (LIPITOR) 10 MG tablet Take 1 tablet (10 mg total) by mouth at bedtime. 04/26/21   Quintella Reichert, MD  Cholecalciferol (VITAMIN D) 125 MCG (5000 UT) CAPS Take 5,000 Units by mouth daily.    [provider]  docusate sodium (COLACE) 100 MG capsule Take 100 mg by mouth daily.    [provider]  hydrALAZINE (APRESOLINE) 50 MG tablet Take 1 tablet (50 mg total) by mouth every 8 (eight) hours. 01/17/22   Quintella Reichert, MD  letrozole [facility]) 2.5 MG tablet Take 2.5 mg by mouth daily.    [provider]  loperamide (IMODIUM A-D) 2 MG tablet Take 2 mg by mouth every 6 (six) hours as needed for diarrhea or loose stools. Do not exceed 8mg  in 24 hours.    [provider]  Multiple Vitamin (DAILY VITE PO) Take  1 tablet by mouth daily.    [provider]  nystatin (MYCOSTATIN/NYSTOP) powder Apply 1 application. topically See admin instructions. Spread topically to area under bilateral breasts & groin twice a day at 8 AM and 8 PM 01/29/20   [provider]  omeprazole (PRILOSEC) 20 MG capsule Take 20 mg by mouth daily. 04/14/21   [provider]  polyethylene glycol (MIRALAX / GLYCOLAX) 17 g packet Take 17 g by mouth daily as needed (constipation).    [provider]  Skin Protectants, Misc. (CALAZIME SKIN PROTECTANT) PSTE Apply 1 application. topically in the morning and at bedtime. Apply to sacrum    [provider]  torsemide (DEMADEX) 10 MG tablet Take 10 mg by mouth every Monday, Wednesday, and Friday. 11/15/21   [provider]  torsemide (DEMADEX) 10 MG tablet Take 10 mg by mouth daily as needed (weight gain of 5lbs in one week or 3lbs overnight.). Take in conjunction with MWF dose    [provider]  triamcinolone cream (KENALOG) 0.1 % Apply topically. 08/25/22    [provider]  vitamin B-12 (CYANOCOBALAMIN) 250 MCG tablet Take 250 mcg by mouth every other day.    [provider]  vitamin C (VITAMIN C) 500 MG tablet Take 1 tablet (500 mg total) by mouth daily. 09/28/19   Lanae Boast, MD    Physical Exam: Vitals:   11/08/23 1415 11/08/23 1500 11/08/23 1530 11/08/23 1624  BP: 121/88 (!) 141/100 (!) 136/97 111/84  Pulse: (!) 159 (!) 150 (!) 148 (!) 131  Resp: (!) 23 (!) 21 (!) 29 20  Temp:    97.6 F (36.4 C)  TempSrc:    Oral  SpO2: 97% 98% 99% 97%  Weight:      Height:       *** Data Reviewed: {Tip this will not be part of the note when signed- Document your independent interpretation of telemetry tracing, EKG, lab, Radiology test or any other diagnostic tests. Add any new diagnostic test ordered today. (Optional):26781} {Results:26384} reviewed labs, imaging, and pertinent records as documented  Assessment and Plan:  Heart failure with preserved EF Last echocardiogram noted EF to be 55 to 60% with indeterminate diastolic parameters when checked last on 01/03/2022. -Admit to a cardiac telemetry bed -Strict I&Os and daily weights -Lasix 40 mg IV twice daily  Atrial fibrillation with RVR -Goal potassium at least 4 and magnesium at least 2. Replace electrolytes as needed to goal -Check TSH -  DVT prophylaxis: Eliquis  Advance Care Planning:   Code Status: Prior ***  Consults: ***  Family Communication: ***  Severity of Illness: {Observation/Inpatient:21159}  Author: Clydie Braun, MD 11/08/2023 4:47 PM  For on call review [email or web address].

## 2023-11-08 NOTE — ED Provider Notes (Signed)
[place] EMERGENCY DEPARTMENT AT [facility] Provider Note   CSN: [id number] Arrival date & time: 11/08/23  1158     History  Chief Complaint  Patient presents with   Shortness of Breath   Tachycardia    Christina Morgan is a 89 y.o. female.  HPI   88 year old female with medical history significant for HLD, HTN, atrial fibrillation on Eliquis, diastolic CHF, CKD presenting to the emergency department with shortness of breath.  The patient presents from her [facility] with her daughter where she was seen by the NP at the facility yesterday who increased her torsemide to 20 mg for the next 5 days.  She endorses worsening shortness of breath at her facility her heart rate was found to be 153.  Additionally, she has had increasing bilateral lower extremity swelling however the right lower extremity is increasingly swollen compared to the left and is significantly asymmetric.  She denies any crampy discomfort in the leg, no redness or warmth.  She has been compliant with her medications at her facility and has not missed any doses of her Eliquis.  Home Medications Prior to Admission medications   Medication Sig Start Date End Date Taking? Authorizing Provider  diclofenac Sodium (VOLTAREN) 1 % GEL Apply 2 g topically 3 (three) times daily as needed. 10/27/23  Yes [provider]  acetaminophen (TYLENOL) 500 MG tablet Take 1,000 mg by mouth 3 (three) times daily as needed for moderate pain or mild pain.    [provider]  amLODipine (NORVASC) 10 MG tablet Take 1 tablet (10 mg total) by mouth daily. 11/28/22   Alver Sorrow, NP  apixaban (ELIQUIS) 5 MG TABS tablet Take 1 tablet (5 mg total) by mouth 2 (two) times daily. 09/21/23   Alver Sorrow, NP  atorvastatin (LIPITOR) 10 MG tablet Take 1 tablet (10 mg total) by mouth at bedtime. 04/26/21   Quintella Reichert, MD  Cholecalciferol (VITAMIN D) 125 MCG (5000 UT) CAPS Take 5,000 Units  by mouth daily.    [provider]  docusate sodium (COLACE) 100 MG capsule Take 100 mg by mouth daily.    [provider]  hydrALAZINE (APRESOLINE) 50 MG tablet Take 1 tablet (50 mg total) by mouth every 8 (eight) hours. 01/17/22   Quintella Reichert, MD  letrozole [facility]) 2.5 MG tablet Take 2.5 mg by mouth daily.    [provider]  loperamide (IMODIUM A-D) 2 MG tablet Take 2 mg by mouth every 6 (six) hours as needed for diarrhea or loose stools. Do not exceed 8mg  in 24 hours.    [provider]  Multiple Vitamin (DAILY VITE PO) Take 1 tablet by mouth daily.    [provider]  nystatin (MYCOSTATIN/NYSTOP) powder Apply 1 application. topically See admin instructions. Spread topically to area under bilateral breasts & groin twice a day at 8 AM and 8 PM 01/29/20   [provider]  omeprazole (PRILOSEC) 20 MG capsule Take 20 mg by mouth daily. 04/14/21   [provider]  polyethylene glycol (MIRALAX / GLYCOLAX) 17 g packet Take 17 g by mouth daily as needed (constipation).    [provider]  Skin Protectants, Misc. (CALAZIME SKIN PROTECTANT) PSTE Apply 1 application. topically in the morning and at bedtime. Apply to sacrum    [provider]  torsemide (DEMADEX) 10 MG tablet Take 10 mg by mouth every Monday, Wednesday, and Friday. 11/15/21   [provider]  torsemide (  DEMADEX) 10 MG tablet Take 10 mg by mouth daily as needed (weight gain of 5lbs in one week or 3lbs overnight.). Take in conjunction with MWF dose    [provider]  triamcinolone cream (KENALOG) 0.1 % Apply topically. 08/25/22   [provider]  vitamin B-12 (CYANOCOBALAMIN) 250 MCG tablet Take 250 mcg by mouth every other day.    [provider]  vitamin C (VITAMIN C) 500 MG tablet Take 1 tablet (500 mg total) by mouth daily. 09/28/19   Lanae Boast, MD      Allergies    Penicillins    Review of Systems   Review of Systems   All other systems reviewed and are negative.   Physical Exam Updated Vital Signs BP (!) 128/104   Pulse (!) 148   Temp 98.2 F (36.8 C)   Resp (!) 26   Ht 5\' 4"  (1.626 m)   Wt 75.3 kg   SpO2 100%   BMI 28.49 kg/m  Physical Exam Vitals and nursing note reviewed.  Constitutional:      General: She is not in acute distress.    Appearance: She is well-developed.  HENT:     Head: Normocephalic and atraumatic.  Eyes:     Conjunctiva/sclera: Conjunctivae normal.  Cardiovascular:     Rate and Rhythm: Tachycardia present. Rhythm irregular.     Pulses: Normal pulses.  Pulmonary:     Effort: Pulmonary effort is normal. No respiratory distress.     Breath sounds: Normal breath sounds.  Abdominal:     Palpations: Abdomen is soft.     Tenderness: There is no abdominal tenderness.  Musculoskeletal:     Cervical back: Neck supple.     Right lower leg: Edema present.     Left lower leg: Edema present.     Comments: Bilateral lower extremity edema, 1+ edema in the left lower extremity, 2+ edema in the right lower extremity  Skin:    General: Skin is warm and dry.     Capillary Refill: Capillary refill takes less than 2 seconds.  Neurological:     Mental Status: She is alert.  Psychiatric:        Mood and Affect: Mood normal.     ED Results / Procedures / Treatments   Labs (all labs ordered are listed, but only abnormal results are displayed) Labs Reviewed  COMPREHENSIVE METABOLIC PANEL - Abnormal; Notable for the following components:      Result Value   Glucose, Bld 124 (*)    Creatinine, Ser 1.29 (*)    GFR, Estimated 40 (*)    All other components within normal limits  CBC - Abnormal; Notable for the following components:   Hemoglobin 10.8 (*)    HCT 33.8 (*)    RDW 16.3 (*)    All other components within normal limits  BRAIN NATRIURETIC PEPTIDE - Abnormal; Notable for the following components:   B Natriuretic Peptide 871.9 (*)    All other components within normal  limits  RESP PANEL BY RT-PCR (RSV, FLU A&B, COVID)  RVPGX2  TROPONIN I (HIGH SENSITIVITY)  TROPONIN I (HIGH SENSITIVITY)    EKG EKG Interpretation Date/Time:  Wednesday November 08 2023 12:21:04 EST Ventricular Rate:  155 PR Interval:    QRS Duration:  81 QT Interval:  339 QTC Calculation: 545 R Axis:   94  Text Interpretation: Atrial fibrillation with rapid V-rate Right axis deviation Low voltage, precordial leads Borderline T abnormalities, diffuse leads Confirmed by Karene Fry,  Fayrene Fearing ([id number]) on 11/08/2023 12:30:15 PM  Radiology DG Chest Port 1 View  Result Date: 11/08/2023 CLINICAL DATA:  Shortness of breath.  History of CHF. EXAM: PORTABLE CHEST 1 VIEW COMPARISON:  03/06/2022 FINDINGS: Unchanged enlarged cardiac silhouette and mediastinal contours with atherosclerotic plaque within the thoracic aorta. There is persistent rightward deviation of the tracheal air column at the level of the aortic arch. Unchanged small layering bilateral effusions and associated bibasilar opacities. Pulmonary vasculature indistinct with cephalization flow. No pneumothorax. No acute osseous abnormalities. IMPRESSION: Similar findings of cardiomegaly, pulmonary edema and small layering bilateral effusions. Electronically Signed   By: Simonne Come M.D.   On: 11/08/2023 13:35   US Venous Img Lower Bilateral  Result Date: 11/08/2023 CLINICAL DATA:  Bilateral lower extremity edema, right-greater-than-left. Evaluate for DVT. EXAM: BILATERAL LOWER EXTREMITY VENOUS DOPPLER ULTRASOUND TECHNIQUE: Gray-scale sonography with graded compression, as well as color Doppler and duplex ultrasound were performed to evaluate the lower extremity deep venous systems from the level of the common femoral vein and including the common femoral, femoral, profunda femoral, popliteal and calf veins including the posterior tibial, peroneal and gastrocnemius veins when visible. The superficial great saphenous vein was also interrogated.  Spectral Doppler was utilized to evaluate flow at rest and with distal augmentation maneuvers in the common femoral, femoral and popliteal veins. COMPARISON:  None Available. FINDINGS: RIGHT LOWER EXTREMITY Common Femoral Vein: No evidence of thrombus. Normal compressibility, respiratory phasicity and response to augmentation. Saphenofemoral Junction: No evidence of thrombus. Normal compressibility and flow on color Doppler imaging. Profunda Femoral Vein: No evidence of thrombus. Normal compressibility and flow on color Doppler imaging. Femoral Vein: No evidence of thrombus. Normal compressibility, respiratory phasicity and response to augmentation. Popliteal Vein: No evidence of thrombus. Normal compressibility, respiratory phasicity and response to augmentation. Calf Veins: No evidence of thrombus. Normal compressibility and flow on color Doppler imaging. Superficial Great Saphenous Vein: No evidence of thrombus. Normal compressibility. Other Findings: There is a moderate amount of subcutaneous edema at the level of the right lower leg and calf. LEFT LOWER EXTREMITY Common Femoral Vein: No evidence of thrombus. Normal compressibility, respiratory phasicity and response to augmentation. Saphenofemoral Junction: No evidence of thrombus. Normal compressibility and flow on color Doppler imaging. Profunda Femoral Vein: No evidence of thrombus. Normal compressibility and flow on color Doppler imaging. Femoral Vein: No evidence of thrombus. Normal compressibility, respiratory phasicity and response to augmentation. Popliteal Vein: No evidence of thrombus. Normal compressibility, respiratory phasicity and response to augmentation. Calf Veins: No evidence of thrombus. Normal compressibility and flow on color Doppler imaging. Superficial Great Saphenous Vein: No evidence of thrombus. Normal compressibility. Other Findings: There is a moderate amount of subcutaneous edema at the level of the left lower leg and calf.  IMPRESSION: No evidence of DVT within either lower extremity. Electronically Signed   By: Simonne Come M.D.   On: 11/08/2023 13:34    Procedures .Critical Care  Performed by: Ernie Avena, MD Authorized by: Ernie Avena, MD   Critical care provider statement:    Critical care time (minutes):  30   Critical care was time spent personally by me on the following activities:  Development of treatment plan with patient or surrogate, discussions with consultants, evaluation of patient's response to treatment, examination of patient, ordering and review of laboratory studies, ordering and review of radiographic studies, ordering and performing treatments and interventions, pulse oximetry, re-evaluation of patient's condition and review of old charts   Care discussed with: admitting provider  Medications Ordered in ED Medications  diltiazem (CARDIZEM) 1 mg/mL load via infusion 10 mg (10 mg Intravenous Bolus from Bag 11/08/23 1241)    And  diltiazem (CARDIZEM) 125 mg in dextrose 5% 125 mL (1 mg/mL) infusion (15 mg/hr Intravenous Rate/Dose Change 11/08/23 1347)  furosemide (LASIX) injection 40 mg (has no administration in time range)    ED Course/ Medical Decision Making/ A&P                                 Medical Decision Making Amount and/or Complexity of Data Reviewed Labs: ordered. Radiology: ordered.  Risk Prescription drug management. Decision regarding hospitalization.    89 year old female with medical history significant for HLD, HTN, atrial fibrillation on Eliquis, diastolic CHF, CKD presenting to the emergency department with shortness of breath.  The patient presents from her [facility] with her daughter where she was seen by the NP at the facility yesterday who increased her torsemide to 20 mg for the next 5 days.  She endorses worsening shortness of breath at her facility her heart rate was found to be 153.  Additionally, she has had increasing  bilateral lower extremity swelling however the right lower extremity is increasingly swollen compared to the left and is significantly asymmetric.  She denies any crampy discomfort in the leg, no redness or warmth.  She has been compliant with her medications at her facility and has not missed any doses of her Eliquis.  On arrival, the patient was afebrile, tachycardic heart rate 159, atrial fibrillation noted on cardiac telemetry with rapid ventricular response, mildly tachypneic RR 27 saturating 94% on room air, BP 133/95.  Atrial fibrillation with RVR was noted on EKG.  Patient with lower extremity edema, concern for CHF exacerbation and atrial fibrillation with RVR.  The patient is not on any rate control medicine that I can see on her home medication list.  She is anticoagulated and has not missed any doses.  She denies any chest pain, endorses dyspnea.  Lower concern for PE.  Given the asymmetry of the patient's lower extremity swelling, will obtain DVT ultrasound.  IV access was obtained and the patient was administered IV diltiazem bolus and started on diltiazem infusion.  EKG: Atrial fibrillation with rapid ventricular response, ventricular rate 155, QTc 545, right axis deviation, borderline T wave changes, no STEMI.  Labs: BNP elevated to 872, cardiac troponin normal at 9, COVID and influenza PCR testing negative, CBC without a leukocytosis, mild anemia to 0.8, CMP with serum creatinine mildly elevated 1.29, downtrending from previous measurements, no acute electrolyte abnormality.  CXR:  IMPRESSION:  Similar findings of cardiomegaly, pulmonary edema and small layering  bilateral effusions.    DVT US: Negative  The patient was continued on diltiazem gtt.  She maintained oxygen saturations on room air, and in atrial fibrillation with rapid ventricular response, tachypneic in the 20s, normotensive.  She appears volume overloaded with pulmonary edema, IV Lasix 40 mg was administered.   Hospitalist medicine was consulted for admission to progressive given the patient's atrial fibrillation with RVR, requiring diltiazem infusion, CHF exacerbation.  Family updated regarding the plan of care and the patient was admitted in stable condition.   Final Clinical Impression(s) / ED Diagnoses Final diagnoses:  Acute on chronic congestive heart failure, unspecified heart failure type (HCC)  Atrial fibrillation with rapid ventricular response (HCC)  Acute pulmonary edema (HCC)    Rx / DC  Orders ED Discharge Orders     None         Ernie Avena, MD 11/08/23 1355

## 2023-11-08 NOTE — Telephone Encounter (Signed)
Pt c/o Shortness Of Breath: STAT if SOB developed within the last 24 hours or pt is noticeably SOB on the phone  1. Are you currently SOB (can you hear that pt is SOB on the phone)? Yes   2. How long have you been experiencing SOB? Last night   3. Are you SOB when sitting or when up moving around? Patient is wheelchair bound  4. Are you currently experiencing any other symptoms? Right leg is really swollen

## 2023-11-08 NOTE — Progress Notes (Signed)
   11/08/23 1624  Assess: MEWS Score  Temp 97.6 F (36.4 C)  BP 111/84  Pulse Rate (!) 131 (here with AFib RVR)  Resp 20  SpO2 97 %  O2 Device Room Air  Assess: MEWS Score  MEWS Temp 0  MEWS Systolic 0  MEWS Pulse 3  MEWS RR 0  MEWS LOC 0  MEWS Score 3  MEWS Score Color Yellow  Assess: if the MEWS score is Yellow or Red  Were vital signs accurate and taken at a resting state? No, vital signs rechecked (pt here with Afib RVR on a Cardizem drip, just turned pt in bed and moved her up)  Does the patient meet 2 or more of the SIRS criteria? No  Assess: SIRS CRITERIA  SIRS Temperature  0  SIRS Pulse 1  SIRS Respirations  0  SIRS WBC 0  SIRS Score Sum  1

## 2023-11-08 NOTE — Telephone Encounter (Signed)
Pt daughter called in reports for the last week pt has had leg swelling and foot pain.  Pt lives in a facility, NP at facility ordered a stat xray which was negative.  NP also increased Torsemide to 20 mg PO BID x 5 days.  Pt reports has urinated 4 times today.  Last night and this morning pt has c/o extreme SOB with slight movement.  Pt is normally able to move around in [facility] without difficulty.   Med tech checked BP 139/90-153.  No daily wt to report.  Pt has not missed any doses of Eliquis.  Has no PRN meds for HR.  Advised ED visit for evaluation.  Daughter is agreeable to plan.

## 2023-11-08 NOTE — Progress Notes (Signed)
Orthopedic Tech Progress Note Patient Details:  Christina Morgan 1933/10/08 [id number] Applied cervical soft collar  Ortho Devices Type of Ortho Device: Soft collar Ortho Device/Splint Interventions: Ordered, Application, Adjustment   Post Interventions Patient Tolerated: Well Instructions Provided: Adjustment of device, Care of device  Diannia Ruder 11/08/2023, 5:48 PM

## 2023-11-08 NOTE — ED Notes (Signed)
Pt able to void on bedpan

## 2023-11-09 ENCOUNTER — Inpatient Hospital Stay (HOSPITAL_COMMUNITY): Payer: Medicare Other

## 2023-11-09 DIAGNOSIS — N1832 Chronic kidney disease, stage 3b: Secondary | ICD-10-CM | POA: Diagnosis not present

## 2023-11-09 DIAGNOSIS — E785 Hyperlipidemia, unspecified: Secondary | ICD-10-CM

## 2023-11-09 DIAGNOSIS — I5033 Acute on chronic diastolic (congestive) heart failure: Secondary | ICD-10-CM | POA: Diagnosis not present

## 2023-11-09 DIAGNOSIS — I4891 Unspecified atrial fibrillation: Secondary | ICD-10-CM

## 2023-11-09 DIAGNOSIS — I5031 Acute diastolic (congestive) heart failure: Secondary | ICD-10-CM

## 2023-11-09 DIAGNOSIS — K219 Gastro-esophageal reflux disease without esophagitis: Secondary | ICD-10-CM

## 2023-11-09 DIAGNOSIS — I351 Nonrheumatic aortic (valve) insufficiency: Secondary | ICD-10-CM | POA: Diagnosis not present

## 2023-11-09 DIAGNOSIS — I480 Paroxysmal atrial fibrillation: Secondary | ICD-10-CM | POA: Diagnosis not present

## 2023-11-09 DIAGNOSIS — I10 Essential (primary) hypertension: Secondary | ICD-10-CM | POA: Diagnosis not present

## 2023-11-09 LAB — ECHOCARDIOGRAM COMPLETE
AR max vel: 1.46 cm2
AV Area VTI: 1.42 cm2
AV Area mean vel: 1.38 cm2
AV Mean grad: 5.8 mm[Hg]
AV Peak grad: 9.7 mm[Hg]
Ao pk vel: 1.56 m/s
Area-P 1/2: 5.93 cm2
Height: 64 in
P 1/2 time: 677 ms
S' Lateral: 3.5 cm
Weight: 2663.16 [oz_av]

## 2023-11-09 LAB — BASIC METABOLIC PANEL
Anion gap: 8 (ref 5–15)
BUN: 16 mg/dL (ref 8–23)
CO2: 27 mmol/L (ref 22–32)
Calcium: 9.1 mg/dL (ref 8.9–10.3)
Chloride: 107 mmol/L (ref 98–111)
Creatinine, Ser: 1.42 mg/dL — ABNORMAL HIGH (ref 0.44–1.00)
GFR, Estimated: 36 mL/min — ABNORMAL LOW (ref >60)
Glucose, Bld: 111 mg/dL — ABNORMAL HIGH (ref 70–99)
Potassium: 3.6 mmol/L (ref 3.5–5.1)
Sodium: 142 mmol/L (ref 135–145)

## 2023-11-09 LAB — CBC
HCT: 31.9 % — ABNORMAL LOW (ref 36.0–46.0)
Hemoglobin: 10 g/dL — ABNORMAL LOW (ref 12.0–15.0)
MCH: 25.8 pg — ABNORMAL LOW (ref 26.0–34.0)
MCHC: 31.3 g/dL (ref 30.0–36.0)
MCV: 82.4 fL (ref 80.0–100.0)
Platelets: 310 10*3/uL (ref 150–400)
RBC: 3.87 MIL/uL (ref 3.87–5.11)
RDW: 16.1 % — ABNORMAL HIGH (ref 11.5–15.5)
WBC: 5.5 10*3/uL (ref 4.0–10.5)
nRBC: 0 % (ref 0.0–0.2)

## 2023-11-09 MED ORDER — MAGNESIUM SULFATE 2 GM/50ML IV SOLN
2.0000 g | Freq: Once | INTRAVENOUS | Status: AC
  Administered 2023-11-09: 2 g via INTRAVENOUS
  Filled 2023-11-09: qty 50

## 2023-11-09 MED ORDER — FUROSEMIDE 10 MG/ML IJ SOLN: 80.0000 mg | Freq: Two times a day (BID) | INTRAMUSCULAR | Status: DC

## 2023-11-09 MED ORDER — FUROSEMIDE 10 MG/ML IJ SOLN
40.0000 mg | Freq: Once | INTRAMUSCULAR | Status: AC
  Administered 2023-11-09: 40 mg via INTRAVENOUS
  Filled 2023-11-09: qty 4

## 2023-11-09 MED ORDER — PERFLUTREN LIPID MICROSPHERE
1.0000 mL | INTRAVENOUS | Status: AC | PRN
  Administered 2023-11-09: 2 mL via INTRAVENOUS

## 2023-11-09 MED ORDER — FUROSEMIDE 10 MG/ML IJ SOLN
15.0000 mg/h | INTRAVENOUS | Status: DC
  Administered 2023-11-09 (×2): 10 mg/h via INTRAVENOUS
  Administered 2023-11-10: 15 mg/h via INTRAVENOUS
  Filled 2023-11-09 (×5): qty 20

## 2023-11-09 MED ORDER — METOPROLOL TARTRATE 25 MG PO TABS
25.0000 mg | ORAL_TABLET | Freq: Three times a day (TID) | ORAL | Status: DC
  Administered 2023-11-09 – 2023-11-10 (×5): 25 mg via ORAL
  Filled 2023-11-09 (×4): qty 1
  Filled 2023-11-09: qty 2

## 2023-11-09 MED ORDER — ADULT MULTIVITAMIN W/MINERALS CH
1.0000 | ORAL_TABLET | Freq: Every day | ORAL | Status: DC
  Administered 2023-11-09 – 2023-11-22 (×13): 1 via ORAL
  Filled 2023-11-09 (×14): qty 1

## 2023-11-09 MED ORDER — POTASSIUM CHLORIDE CRYS ER 20 MEQ PO TBCR
40.0000 meq | EXTENDED_RELEASE_TABLET | Freq: Once | ORAL | Status: AC
  Administered 2023-11-09: 40 meq via ORAL
  Filled 2023-11-09: qty 2

## 2023-11-09 MED ORDER — ATORVASTATIN CALCIUM 10 MG PO TABS
10.0000 mg | ORAL_TABLET | Freq: Every day | ORAL | Status: DC
  Administered 2023-11-09 – 2023-11-21 (×13): 10 mg via ORAL
  Filled 2023-11-09 (×13): qty 1

## 2023-11-09 MED ORDER — DIGOXIN 0.25 MG/ML IJ SOLN: 0.5000 mg | Freq: Once | INTRAMUSCULAR | Status: DC

## 2023-11-09 MED ORDER — PANTOPRAZOLE SODIUM 40 MG PO TBEC
40.0000 mg | DELAYED_RELEASE_TABLET | Freq: Every day | ORAL | Status: DC
  Administered 2023-11-09 – 2023-11-22 (×13): 40 mg via ORAL
  Filled 2023-11-09 (×14): qty 1

## 2023-11-09 NOTE — Assessment & Plan Note (Signed)
Continue pantoprazole. °

## 2023-11-09 NOTE — Assessment & Plan Note (Addendum)
Echocardiogram with preserved LV systolic function with EF 55 to 60%, RV with preserved systolic function, RVSP 44,6 mmHg, LA with moderate dilatation, mild to moderate aortic valve regurgitation.   Acute on chronic core pulmonale.  Documented urine output is 550 cc Systolic blood  pressure 120's  mmHg.  11/29 follow up chest radiograph with persistent signs of pulmonary edema.   Patient has not responded to diuretic therapy as expected.   Plan to continue metoprolol and rate control atrial fibrillation with diltiazem.  Poor prognosis, if continue to deteriorate will need further comfort care measures.   Acute hypoxemic respiratory failure due to acute cardiogenic pulmonary edema.  Worsening oxygenation and increases 02 demands. Today she is on heated high flow nasal cannula 25 L/min with Fi02 70%, with 02 saturation 99%.   Acute metabolic encephalopathy, she has become less responsive today.  Poor prognosis.

## 2023-11-09 NOTE — Consult Note (Addendum)
CARDIOLOGY CONSULT NOTE    Patient ID: Christina Morgan; [id number]; Dec 22, 1932   Admit date: 11/08/2023 Date of Consult: 11/09/2023  Primary Care Provider: [facility] Primary Cardiologist:  Primary Electrophysiologist:    History of Present Illness:   [name] is a 89 year old F known to have chronic diastolic heart failure, paroxysmal A-fib on AC, HTN, HLD, moderate AI presented to the ER with DOE, orthopnea and bilateral lower extremity swelling x few days prior to presentation.  No angina, dizziness or syncope.  EKG shows A-fib with RVR, HR 140-150s.  BNP elevated, 871.9.  Serum creatinine mildly increased today, 1.42 compared to 1.29 yesterday.  Received IV Lasix 40 mg 2 doses this morning.  Echo performed today showed normal LVEF, normal RV function, mild MR, mild to moderate AI, CVP 15 mmHg.  Past Medical History:  Diagnosis Date   Cancer [facility])    endometrial ca   Chronic diastolic CHF (congestive heart failure) (HCC)    Chronic edema    CKD (chronic kidney disease), stage III (HCC)    Dyslipidemia    Fracture of left humerus    History of cardiovascular stress test    Lexiscan Myoview (06/2014): No ischemia or scar, not gated, low risk   Hx of echocardiogram    Echo (05/02/14): EF 60% to 65%. No regional wall motion abnormalities. Mild AI, mildly dilated aortic root (37 mm), trivial MR, trivial TR   Hyperlipidemia    Hypertension    Lipoma of skin    multiple   Osteopenia    PAF (paroxysmal atrial fibrillation) (HCC)    failed DCCV 05/2014 >> Flecainide started >> DCCV 7/15 sucessful;  f/u ETT neg for pro-arrhythmia >> recurrent AF/AFL >> Flecainide inc to 100 bid with repeat DCCV 9/15   Vitamin D deficiency 09/25/2019    Past Surgical History:  Procedure Laterality Date   CARDIOVERSION N/A 06/05/2014   Procedure: CARDIOVERSION;  Surgeon: Quintella Reichert, MD;  Location: [facility] ENDOSCOPY;  Service: Cardiovascular;  Laterality: N/A;   CARDIOVERSION  N/A 07/11/2014   Procedure: CARDIOVERSION;  Surgeon: Quintella Reichert, MD;  Location: [facility] ENDOSCOPY;  Service: Cardiovascular;  Laterality: N/A;   CARDIOVERSION N/A 09/04/2014   Procedure: CARDIOVERSION;  Surgeon: Quintella Reichert, MD;  Location: [facility] ENDOSCOPY;  Service: Cardiovascular;  Laterality: N/A;   CARDIOVERSION N/A 05/04/2022   Procedure: CARDIOVERSION;  Surgeon: Little Ishikawa, MD;  Location: [facility] ENDOSCOPY;  Service: Cardiovascular;  Laterality: N/A;   COLONOSCOPY  2008   COLONOSCOPY N/A 05/20/2015   Procedure: COLONOSCOPY;  Surgeon: Bernette Redbird, MD;  Location: [facility] ENDOSCOPY;  Service: Endoscopy;  Laterality: N/A;   FEMUR IM NAIL Right 09/24/2019   Procedure: INTRAMEDULLARY (IM) NAIL FEMORAL;  Surgeon: Myrene Galas, MD;  Location: [facility] OR;  Service: Orthopedics;  Laterality: Right;   IR KYPHO EA ADDL LEVEL THORACIC OR LUMBAR  04/12/2019   IR KYPHO THORACIC WITH BONE BIOPSY  04/12/2019   ROBOTIC ASSISTED SUPRACERVICAL HYSTERECTOMY WITH BILATERAL SALPINGO OOPHERECTOMY  11/06/2012   and bilateral pelvic lymph node dissection       Inpatient Medications: Scheduled Meds:  apixaban  5 mg Oral BID   atorvastatin  10 mg Oral QHS   digoxin  0.5 mg Intravenous Once   metoprolol tartrate  25 mg Oral TID   multivitamin with minerals  1 tablet Oral Daily   pantoprazole  40 mg Oral Daily   sodium chloride flush  3 mL Intravenous Q12H   Continuous Infusions:  diltiazem (CARDIZEM)  infusion 15 mg/hr (11/09/23 1601)   furosemide (LASIX) 200 mg in dextrose 5 % 100 mL (2 mg/mL) infusion 10 mg/hr (11/09/23 1601)   PRN Meds: acetaminophen **OR** acetaminophen, albuterol  Allergies:    Allergies  Allergen Reactions   Penicillins Itching and Other (See Comments)    Dizziness to the patient of passing out- told by MD to "never take this again" Did it involve swelling of the face/tongue/throat, SOB, or low BP? No Did it involve sudden or severe rash/hives, skin peeling, or any reaction on the  inside of your mouth or nose? No Did you need to seek medical attention at a hospital or doctor's office? No When did it last happen? "a long time ago"    If all above answers are "NO", may proceed with cephalosporin use.     Social History:   Social History   Socioeconomic History   Marital status: Widowed    Spouse name: Not on file   Number of children: Not on file   Years of education: Not on file   Highest education level: Not on file  Occupational History   Not on file  Tobacco Use   Smoking status: Never    Passive exposure: Past   Smokeless tobacco: Never  Vaping Use   Vaping status: Never Used  Substance and Sexual Activity   Alcohol use: No   Drug use: No   Sexual activity: Not on file  Other Topics Concern   Not on file  Social History Narrative   ** Merged History Encounter **       Social Determinants of Health   Financial Resource Strain: Low Risk  (09/27/2019)   Overall Financial Resource Strain (CARDIA)    Difficulty of Paying Living Expenses: Not hard at all  Food Insecurity: No Food Insecurity (11/09/2023)   Hunger Vital Sign    Worried About Running Out of Food in the Last Year: Never true    Ran Out of Food in the Last Year: Never true  Transportation Needs: No Transportation Needs (11/09/2023)   PRAPARE - Administrator, Civil Service (Medical): No    Lack of Transportation (Non-Medical): No  Physical Activity: Inactive (09/27/2019)   Exercise Vital Sign    Days of Exercise per Week: 0 days    Minutes of Exercise per Session: 0 min  Stress: No Stress Concern Present (09/27/2019)   [employer] of Occupational Health - Occupational Stress Questionnaire    Feeling of Stress : Only a little  Social Connections: Unknown (04/15/2022)   Received from [facility]   Social Network    Social Network: Not on file  Intimate Partner Violence: Not At Risk (11/09/2023)   Humiliation, Afraid, Rape, and Kick questionnaire     Fear of Current or Ex-Partner: No    Emotionally Abused: No    Physically Abused: No    Sexually Abused: No    Family History:    Family History  Problem Relation Age of Onset   Cancer Father 17       metastatic oropharyngeal ca   Heart attack Mother    Coronary artery disease Mother      ROS:  Please see the history of present illness.  ROS  All other ROS reviewed and negative.     Physical Exam/Data:   Vitals:   11/09/23 0335 11/09/23 0340 11/09/23 0347 11/09/23 0817  BP: 121/82 121/82  (!) 113/99  Pulse:  (!) 142  (!) 147  Resp:  19  18  Temp:  (!) 97.5 F (36.4 C)  98.1 F (36.7 C)  TempSrc:    Oral  SpO2: 98% 98%  97%  Weight:   75.5 kg   Height:        Intake/Output Summary (Last 24 hours) at 11/09/2023 1612 Last data filed at 11/09/2023 1601 Gross per 24 hour  Intake 737.77 ml  Output 1225 ml  Net -487.23 ml   Filed Weights   11/08/23 1213 11/09/23 0347  Weight: 75.3 kg 75.5 kg   Body mass index is 28.57 kg/m.  General:  Well nourished, well developed, in no acute distress HEENT: normal Lymph: no adenopathy Neck: JVD elevated Endocrine:  No thryomegaly Vascular: No carotid bruits; FA pulses 2+ bilaterally without bruits  Cardiac:  normal S1, S2; RRR; no murmur  Lungs:  clear to auscultation bilaterally, no wheezing, rhonchi or rales  Abd: soft, nontender, no hepatomegaly  Ext: 1+ pitting edema Musculoskeletal:  No deformities, BUE and BLE strength normal and equal Skin: warm and dry  Neuro:  CNs 2-12 intact, no focal abnormalities noted Psych:  Normal affect   EKG:  The EKG was personally reviewed and demonstrates: A-fib with RVR Telemetry:  Telemetry was personally reviewed and demonstrates: A-fib with RVR with HR 130-150s   Laboratory Data:  Chemistry Recent Labs  Lab 11/08/23 1223 11/09/23 0230  NA 144 142  K 3.6 3.6  CL 104 107  CO2 26 27  GLUCOSE 124* 111*  BUN 21 16  CREATININE 1.29* 1.42*  CALCIUM 10.0 9.1  GFRNONAA  40* 36*  ANIONGAP 14 8    Recent Labs  Lab 11/08/23 1223  PROT 7.0  ALBUMIN 4.1  AST 17  ALT 13  ALKPHOS 102  BILITOT 0.7   Hematology Recent Labs  Lab 11/08/23 1223 11/09/23 0230  WBC 8.3 5.5  RBC 4.07 3.87  HGB 10.8* 10.0*  HCT 33.8* 31.9*  MCV 83.0 82.4  MCH 26.5 25.8*  MCHC 32.0 31.3  RDW 16.3* 16.1*  PLT 345 310   Cardiac EnzymesNo results for input(s): "TROPONINI" in the last 168 hours. No results for input(s): "TROPIPOC" in the last 168 hours.  BNP Recent Labs  Lab 11/08/23 1223  BNP 871.9*    DDimer No results for input(s): "DDIMER" in the last 168 hours.  Radiology/Studies:  ECHOCARDIOGRAM COMPLETE  Result Date: 11/09/2023    ECHOCARDIOGRAM REPORT   Patient Name:   BRYTNEE FOREST Date of Exam: 11/09/2023 Medical Rec #:  [id number]           Height:       64.0 in Accession #:    [id number]          Weight:       166.4 lb Date of Birth:  [birth year]            BSA:          1.809 m Patient Age:    89 years            BP:           121/82 mmHg Patient Gender: F                   HR:           120 bpm. Exam Location:  Inpatient Procedure: 2D Echo, Cardiac Doppler, Color Doppler and Intracardiac            Opacification Agent Indications:    CHF-Acute Diastolic I50.31  History:        Patient has prior history of Echocardiogram examinations, most                 recent 01/03/2022. Arrythmias:Atrial Fibrillation; Risk                 Factors:Hypertension.  Sonographer:    Darlys Gales Referring Phys: [id number] RONDELL A SMITH IMPRESSIONS  1. Left ventricular ejection fraction, by estimation, is 55 to 60%. The left ventricle has normal function. The left ventricle has no regional wall motion abnormalities. Indeterminate diastolic filling due to E-A fusion.  2. Right ventricular systolic function is normal. The right ventricular size is normal. There is mildly elevated pulmonary artery systolic pressure. The estimated right ventricular systolic pressure is 44.6 mmHg.  3. Left  atrial size was moderately dilated.  4. The mitral valve is abnormal. Mild mitral valve regurgitation. No evidence of mitral stenosis. Severe mitral annular calcification.  5. The aortic valve has an indeterminant number of cusps. Aortic valve regurgitation is mild to moderate. No aortic stenosis is present.  6. The inferior vena cava is dilated in size with <50% respiratory variability, suggesting right atrial pressure of 15 mmHg. FINDINGS  Left Ventricle: Left ventricular ejection fraction, by estimation, is 55 to 60%. The left ventricle has normal function. The left ventricle has no regional wall motion abnormalities. The left ventricular internal cavity size was normal in size. There is  no left ventricular hypertrophy. Indeterminate diastolic filling due to E-A fusion. Right Ventricle: The right ventricular size is normal. No increase in right ventricular wall thickness. Right ventricular systolic function is normal. There is mildly elevated pulmonary artery systolic pressure. The tricuspid regurgitant velocity is 2.72  m/s, and with an assumed right atrial pressure of 15 mmHg, the estimated right ventricular systolic pressure is 44.6 mmHg. Left Atrium: Left atrial size was moderately dilated. Right Atrium: Right atrial size was normal in size. Pericardium: There is no evidence of pericardial effusion. Mitral Valve: The mitral valve is abnormal. Severe mitral annular calcification. Mild mitral valve regurgitation. No evidence of mitral valve stenosis. Tricuspid Valve: The tricuspid valve is normal in structure. Tricuspid valve regurgitation is mild . No evidence of tricuspid stenosis. Aortic Valve: The aortic valve has an indeterminant number of cusps. Aortic valve regurgitation is mild to moderate. Aortic regurgitation PHT measures 677 msec. No aortic stenosis is present. Aortic valve mean gradient measures 5.8 mmHg. Aortic valve peak gradient measures 9.7 mmHg. Aortic valve area, by VTI measures 1.42 cm.  Pulmonic Valve: The pulmonic valve was normal in structure. Pulmonic valve regurgitation is mild. No evidence of pulmonic stenosis. Aorta: The aortic root is normal in size and structure. Venous: The inferior vena cava is dilated in size with less than 50% respiratory variability, suggesting right atrial pressure of 15 mmHg. IAS/Shunts: No atrial level shunt detected by color flow Doppler.  LEFT VENTRICLE PLAX 2D LVIDd:         4.50 cm LVIDs:         3.50 cm LV PW:         1.00 cm LV IVS:        0.95 cm LVOT diam:     1.80 cm LV SV:         40 LV SV Index:   22 LVOT Area:     2.54 cm  LEFT ATRIUM              Index  RIGHT ATRIUM           Index LA Vol (A2C):   106.0 ml 58.58 ml/m  RA Area:     19.50 cm LA Vol (A4C):   73.9 ml  40.84 ml/m  RA Volume:   53.30 ml  29.46 ml/m LA Biplane Vol: 89.0 ml  49.19 ml/m  AORTIC VALVE AV Area (Vmax):    1.46 cm AV Area (Vmean):   1.38 cm AV Area (VTI):     1.42 cm AV Vmax:           155.60 cm/s AV Vmean:          115.740 cm/s AV VTI:            0.280 m AV Peak Grad:      9.7 mmHg AV Mean Grad:      5.8 mmHg LVOT Vmax:         89.30 cm/s LVOT Vmean:        62.550 cm/s LVOT VTI:          0.156 m LVOT/AV VTI ratio: 0.56 AI PHT:            677 msec MITRAL VALVE                TRICUSPID VALVE MV Area (PHT): 5.93 cm     TR Peak grad:   29.6 mmHg MV Decel Time: 128 msec     TR Vmax:        272.00 cm/s MV E velocity: 124.00 cm/s                             SHUNTS                             Systemic VTI:  0.16 m                             Systemic Diam: 1.80 cm Marquasia Schmieder Priya Chee Dimon Electronically signed by Winfield Rast Nohea Kras Signature Date/Time: 11/09/2023/11:05:02 AM    Final    DG Chest Port 1 View  Result Date: 11/08/2023 CLINICAL DATA:  Shortness of breath.  History of CHF. EXAM: PORTABLE CHEST 1 VIEW COMPARISON:  03/06/2022 FINDINGS: Unchanged enlarged cardiac silhouette and mediastinal contours with atherosclerotic plaque within the thoracic aorta.  There is persistent rightward deviation of the tracheal air column at the level of the aortic arch. Unchanged small layering bilateral effusions and associated bibasilar opacities. Pulmonary vasculature indistinct with cephalization flow. No pneumothorax. No acute osseous abnormalities. IMPRESSION: Similar findings of cardiomegaly, pulmonary edema and small layering bilateral effusions. Electronically Signed   By: Simonne Come M.D.   On: 11/08/2023 13:35   US Venous Img Lower Bilateral  Result Date: 11/08/2023 CLINICAL DATA:  Bilateral lower extremity edema, right-greater-than-left. Evaluate for DVT. EXAM: BILATERAL LOWER EXTREMITY VENOUS DOPPLER ULTRASOUND TECHNIQUE: Gray-scale sonography with graded compression, as well as color Doppler and duplex ultrasound were performed to evaluate the lower extremity deep venous systems from the level of the common femoral vein and including the common femoral, femoral, profunda femoral, popliteal and calf veins including the posterior tibial, peroneal and gastrocnemius veins when visible. The superficial great saphenous vein was also interrogated. Spectral Doppler was utilized to evaluate flow at rest and with distal augmentation maneuvers in the common femoral, femoral and popliteal veins. COMPARISON:  None  Available. FINDINGS: RIGHT LOWER EXTREMITY Common Femoral Vein: No evidence of thrombus. Normal compressibility, respiratory phasicity and response to augmentation. Saphenofemoral Junction: No evidence of thrombus. Normal compressibility and flow on color Doppler imaging. Profunda Femoral Vein: No evidence of thrombus. Normal compressibility and flow on color Doppler imaging. Femoral Vein: No evidence of thrombus. Normal compressibility, respiratory phasicity and response to augmentation. Popliteal Vein: No evidence of thrombus. Normal compressibility, respiratory phasicity and response to augmentation. Calf Veins: No evidence of thrombus. Normal compressibility and  flow on color Doppler imaging. Superficial Great Saphenous Vein: No evidence of thrombus. Normal compressibility. Other Findings: There is a moderate amount of subcutaneous edema at the level of the right lower leg and calf. LEFT LOWER EXTREMITY Common Femoral Vein: No evidence of thrombus. Normal compressibility, respiratory phasicity and response to augmentation. Saphenofemoral Junction: No evidence of thrombus. Normal compressibility and flow on color Doppler imaging. Profunda Femoral Vein: No evidence of thrombus. Normal compressibility and flow on color Doppler imaging. Femoral Vein: No evidence of thrombus. Normal compressibility, respiratory phasicity and response to augmentation. Popliteal Vein: No evidence of thrombus. Normal compressibility, respiratory phasicity and response to augmentation. Calf Veins: No evidence of thrombus. Normal compressibility and flow on color Doppler imaging. Superficial Great Saphenous Vein: No evidence of thrombus. Normal compressibility. Other Findings: There is a moderate amount of subcutaneous edema at the level of the left lower leg and calf. IMPRESSION: No evidence of DVT within either lower extremity. Electronically Signed   By: Simonne Come M.D.   On: 11/08/2023 13:34    Assessment and Plan:   Acute diastolic heart failure A-fib with RVR Mild to moderate AI   -Presented with DOE, orthopnea and bilateral lower EXTR swelling x few days prior to presentation. EKG shows A-fib with RVR. BNP elevated, 871.  Echocardiogram today showed LVEF normal, normal RV function, mild to moderate AI and CVP 15 mmHg.  Received IV Lasix 40 mg 2 doses this morning. Switch IV Lasix boluses to Lasix drip at 10 mg/h. Currently on diltiazem drip 15 mg/h for management of A-fib with RVR, will continue.  -Outpatient echo surveillance for management of AI.    For questions or updates, please contact [facility] Please consult [email or web address] for contact info under Cardiology/STEMI.    Signed, Herbert Deaner, MD 11/09/2023 4:12 PM

## 2023-11-09 NOTE — Progress Notes (Addendum)
Progress Note   Patient: Christina Morgan ZOX:[id number] DOB: 06-Sep-1933 DOA: 11/08/2023     1 DOS: the patient was seen and examined on 11/09/2023   Brief hospital course: Mrs. Pellicer was admitted to the hospital with the working diagnosis of atrial fibrillation with RVR in the setting of heart failure decompensation.   89 yo female with the past medical history of hypertension, hyperlipidemia, paroxysmal atrial fibrillation, and CKD who presented with worsening lower extremity edema. Worsening symptoms for 7 days, which progressed to dyspnea over the last 24 hrs prior to admission. Her diuretic dose was increased at SNF with no significant improvement in her symptoms. She was seen at [facility] ED, found volume overloaded and in atrial fibrillation with RVR, transferred to [facility] for further management.  His heart rate was 169 bpm, RR 18 to 29, blood pressure 136/97 and 02 saturation 97%, lungs with rales bilaterally with no wheezing or rhonchi, positive JVD, heart with S1 and S2 present, irregularly irregular with no gallops, abdomen not distended and positive lower extremity edema.   Na 144, K 3,6 Cl 104, bicarbonate 26, glucose 124, BUN 21 cr 1,29  BNP 871  High sensitive troponin 9 and 9  Wbc 8,3 hgb 10,8 plt 345  Sars covid 19 negative   Chest radiograph with cardiomegaly, bilateral hilar vascular congestion and cephalization of the vasculature. Bilateral pleural effusions.   EKG 155 bpm, right axis deviation, atrial fibrillation rhythm with poor R R wave progression, no significant ST segment or T wave changes.   Patient was placed on diltiazem infusion and IV furosemide.    Assessment and Plan: * Acute on chronic diastolic CHF (congestive heart failure) (HCC) Echocardiogram with preserved LV systolic function with EF 55 to 60%, RV with preserved systolic function, RVSP 44,6 mmHg, LA with moderate dilatation, mild to moderate aortic valve regurgitation.   Acute on chronic core  pulmonale.  Documented urine output is 700 cc Patient clinically continue volume overloaded.  Systolic blood pressure today 811'B  Plan to increase furosemide to 80 mg IV bid.  Will add metoprolol 25 mg po tid  Continue to hold on amlodipine and hydralazine for now.  Needs better rate control atrial fibrillation, before adding guideline directed medical therapy.   PAF (paroxysmal atrial fibrillation) (HCC) Patient with atrial fibrillation with RVR.   In the past she has been on B blocker, flecainide and amiodarone, all discontinued due to patient converting to sinus rhythm with bradycardia. 04/2022 had direct current cardioversion, follow up 05/2022 patient was back in atrial fibrillation.   Currently on diltiazem infusion at 15 mg/hr with heart rate in the 130, atrial fibrillation with occasional PVC.  Will add metoprolol 25 mg po tid. Consult cardiology for further recommendations, she may need repeat direct current cardioversion.    Chronic kidney disease, stage 3b (HCC) Renal function with serum cr at 1,42 with K at 3,6 and serum bicarbonate at 27  Na 142 Mg 1,8   Plan to continue diuresis with furosemide IV. Add 2 g mag and 40 kcl po, to keel K at 4 and Mg at 2 range.  Follow up renal function and electrolytes in am.   Essential hypertension, benign Continue blood pressure monitoring.  Hold on amlodipine and hydralazine for now until atrial fibrillation better controlled.   GERD (gastroesophageal reflux disease) Continue pantoprazole.  Dyslipidemia Continue statin therapy.         Subjective: Patient continue to have dyspnea and edema, no chest pain, no palpitations.  Physical Exam: Vitals:   11/09/23 0335 11/09/23 0340 11/09/23 0347 11/09/23 0817  BP: 121/82 121/82  (!) 113/99  Pulse:  (!) 142  (!) 147  Resp:  19  18  Temp:  (!) 97.5 F (36.4 C)  98.1 F (36.7 C)  TempSrc:    Oral  SpO2: 98% 98%  97%  Weight:   75.5 kg   Height:       Neurology awake  and alert ENT with mild pallor Cardiovascular with S1 and S2 present, irregularly irregular with no gallops, rubs or murmurs Positive moderate JVD Positive lower extremity edema more right than left  Respiratory with rales bilaterally with no wheezing or rhonchi Abdomen with no distention  Data Reviewed:    Family Communication: I spoke with patient's daughter at the bedside, we talked in detail about patient's condition, plan of care and prognosis and all questions were addressed.   Disposition: Status is: Inpatient Remains inpatient appropriate because: IV AV blockade and IV diuresis   Planned Discharge Destination: Skilled nursing facility      Author: Coralie Keens, MD 11/09/2023 12:46 PM  For on call review [email or web address].

## 2023-11-09 NOTE — Plan of Care (Signed)
  Problem: Health Behavior/Discharge Planning: Goal: Ability to manage health-related needs will improve Outcome: Progressing   Problem: Clinical Measurements: Goal: Will remain free from infection Outcome: Progressing Goal: Diagnostic test results will improve Outcome: Progressing   Problem: Coping: Goal: Level of anxiety will decrease Outcome: Progressing   Problem: Elimination: Goal: Will not experience complications related to urinary retention Outcome: Progressing   Problem: Pain Management: Goal: General experience of comfort will improve Outcome: Progressing   Problem: Safety: Goal: Ability to remain free from injury will improve Outcome: Progressing

## 2023-11-09 NOTE — Assessment & Plan Note (Addendum)
Continue blood pressure monitoring.  Continue with metoprolol and diltiazem.

## 2023-11-09 NOTE — Assessment & Plan Note (Signed)
Continue statin therapy.

## 2023-11-09 NOTE — Assessment & Plan Note (Addendum)
Worsening renal function due to cardiorenal syndrome. Today continue with signs of volume overload.  Renal function with serum cr at 1,92 with K at 4,6 and serum bicarbonate at 23.  Na 139 P 5.8 Mg. 2.5  Plan to continue diuresis with furosemide IV drip and will probably add one dose of metolazone today.  Follow up renal function and electrolytes in am.  Will place a Foley catheter to for close monitoring urine output.

## 2023-11-09 NOTE — Hospital Course (Addendum)
Mrs. Mannarino was admitted to the hospital with the working diagnosis of atrial fibrillation with RVR in the setting of heart failure decompensation.   89 yo female with the past medical history of hypertension, hyperlipidemia, paroxysmal atrial fibrillation, and CKD who presented with worsening lower extremity edema. Worsening symptoms for 7 days, which progressed to dyspnea over the last 24 hrs prior to admission. Her diuretic dose was increased at SNF with no significant improvement in her symptoms. She was seen at [facility] ED, found volume overloaded and in atrial fibrillation with RVR, transferred to [facility] for further management.  His heart rate was 169 bpm, RR 18 to 29, blood pressure 136/97 and 02 saturation 97%, lungs with rales bilaterally with no wheezing or rhonchi, positive JVD, heart with S1 and S2 present, irregularly irregular with no gallops, abdomen not distended and positive lower extremity edema.   Na 144, K 3,6 Cl 104, bicarbonate 26, glucose 124, BUN 21 cr 1,29  BNP 871  High sensitive troponin 9 and 9  Wbc 8,3 hgb 10,8 plt 345  Sars covid 19 negative   Chest radiograph with cardiomegaly, bilateral hilar vascular congestion and cephalization of the vasculature. Bilateral pleural effusions.   EKG 155 bpm, right axis deviation, atrial fibrillation rhythm with poor R R wave progression, no significant ST segment or T wave changes.   [place] lower extremities negative for deep vein thrombosis.   Patient was placed on diltiazem infusion and IV furosemide.    11/29 patient with improvement in her symptoms with better rate controlled atrial fibrillation.  11/30 worsening renal function. She has developed acute metabolic encephalopathy.  12/01 volume status has improved, but she continue with high 02 requirements.  12/02 patient on regular high flow nasal cannula, continue very weak and deconditioned.  12/03 clinically is improving, plan to transfer to SNF when bed available.

## 2023-11-09 NOTE — Assessment & Plan Note (Addendum)
Telemetry with atrial fibrillation/ atrial flutter in the 70 bpm range.   In the past she has been on B blocker, flecainide and amiodarone, all discontinued due to patient converting to sinus rhythm with bradycardia. 04/2022 had direct current cardioversion, follow up 05/2022 patient was back in atrial fibrillation.   Continue rate control with oral diltiazem 120 mg bid and metoprolol 25 mg po bid.  Anticoagulation with apixaban.  Continue telemetry monitoring.

## 2023-11-10 ENCOUNTER — Inpatient Hospital Stay (HOSPITAL_COMMUNITY): Payer: Medicare Other

## 2023-11-10 DIAGNOSIS — I4891 Unspecified atrial fibrillation: Secondary | ICD-10-CM | POA: Diagnosis not present

## 2023-11-10 DIAGNOSIS — I10 Essential (primary) hypertension: Secondary | ICD-10-CM | POA: Diagnosis not present

## 2023-11-10 DIAGNOSIS — I351 Nonrheumatic aortic (valve) insufficiency: Secondary | ICD-10-CM | POA: Diagnosis not present

## 2023-11-10 DIAGNOSIS — N1832 Chronic kidney disease, stage 3b: Secondary | ICD-10-CM | POA: Diagnosis not present

## 2023-11-10 DIAGNOSIS — I480 Paroxysmal atrial fibrillation: Secondary | ICD-10-CM | POA: Diagnosis not present

## 2023-11-10 DIAGNOSIS — I5033 Acute on chronic diastolic (congestive) heart failure: Secondary | ICD-10-CM | POA: Diagnosis not present

## 2023-11-10 LAB — PROCALCITONIN: Procalcitonin: <0.1 ng/mL

## 2023-11-10 LAB — BASIC METABOLIC PANEL
Anion gap: 12 (ref 5–15)
BUN: 27 mg/dL — ABNORMAL HIGH (ref 8–23)
CO2: 23 mmol/L (ref 22–32)
Calcium: 9.5 mg/dL (ref 8.9–10.3)
Chloride: 104 mmol/L (ref 98–111)
Creatinine, Ser: 1.92 mg/dL — ABNORMAL HIGH (ref 0.44–1.00)
GFR, Estimated: 25 mL/min — ABNORMAL LOW (ref >60)
Glucose, Bld: 150 mg/dL — ABNORMAL HIGH (ref 70–99)
Potassium: 4.6 mmol/L (ref 3.5–5.1)
Sodium: 139 mmol/L (ref 135–145)

## 2023-11-10 LAB — BRAIN NATRIURETIC PEPTIDE: B Natriuretic Peptide: 1299.8 pg/mL — ABNORMAL HIGH (ref 0.0–100.0)

## 2023-11-10 LAB — MAGNESIUM: Magnesium: 2.5 mg/dL — ABNORMAL HIGH (ref 1.7–2.4)

## 2023-11-10 LAB — PHOSPHORUS: Phosphorus: 5.8 mg/dL — ABNORMAL HIGH (ref 2.5–4.6)

## 2023-11-10 MED ORDER — ONDANSETRON HCL 4 MG/2ML IJ SOLN
4.0000 mg | Freq: Four times a day (QID) | INTRAMUSCULAR | Status: DC | PRN
Start: 2023-11-10 — End: 2023-11-22
  Filled 2023-11-10: qty 2

## 2023-11-10 MED ORDER — METOLAZONE 2.5 MG PO TABS
2.5000 mg | ORAL_TABLET | Freq: Once | ORAL | Status: AC
  Administered 2023-11-10: 2.5 mg via ORAL
  Filled 2023-11-10: qty 1

## 2023-11-10 MED ORDER — HYDROMORPHONE HCL 1 MG/ML IJ SOLN: 0.5000 mg | INTRAMUSCULAR | Status: DC | PRN

## 2023-11-10 MED ORDER — ENSURE ENLIVE PO LIQD
237.0000 mL | Freq: Two times a day (BID) | ORAL | Status: DC
  Administered 2023-11-10 – 2023-11-13 (×6): 237 mL via ORAL

## 2023-11-10 NOTE — Progress Notes (Signed)
Provider notified diltiazem drip turned off for heart rate in the 50's and pauses roughly 2 seconds in length. Provider stated it was okay to leave drip off for now.

## 2023-11-10 NOTE — Plan of Care (Signed)
  Problem: Education: Goal: Knowledge of General Education information will improve Description: Including pain rating scale, medication(s)/side effects and non-pharmacologic comfort measures Outcome: Progressing   Problem: Health Behavior/Discharge Planning: Goal: Ability to manage health-related needs will improve Outcome: Progressing   Problem: Clinical Measurements: Goal: Ability to maintain clinical measurements within normal limits will improve Outcome: Progressing Goal: Will remain free from infection Outcome: Progressing Goal: Diagnostic test results will improve Outcome: Progressing Goal: Respiratory complications will improve Outcome: Progressing Goal: Cardiovascular complication will be avoided Outcome: Progressing   Problem: Activity: Goal: Risk for activity intolerance will decrease Outcome: Progressing   Problem: Coping: Goal: Level of anxiety will decrease Outcome: Progressing   Problem: Elimination: Goal: Will not experience complications related to bowel motility Outcome: Progressing Goal: Will not experience complications related to urinary retention Outcome: Progressing   Problem: Pain Management: Goal: General experience of comfort will improve Outcome: Progressing   Problem: Safety: Goal: Ability to remain free from injury will improve Outcome: Progressing   Problem: Skin Integrity: Goal: Risk for impaired skin integrity will decrease Outcome: Progressing

## 2023-11-10 NOTE — Progress Notes (Signed)
TRH night cross cover note:   I was contacted by RN regarding this patient's low urine output, increase in supplemental O2 demands, and wheezing on exam.   She is here with acute on chronic diastolic heart failure as well as atrial fibrillation with RVR.   RN conveys gradual increase in supplemental oxygen requirements this evening, now up to 3 L nasal cannula, while noting worsening wheezing on exam.   Cardiology following, and have recently started the patient on Lasix drip.  RN conveys that the patient has had 0 urine output over the course of this evening shift, with bladder scan showing no evidence of residual urine in bladder.  Respiratory rate remains mildly elevated, reported to be in the low 20s, which appears consistent with respiratory rate during dayshift.  It appears that she is in atrial fibrillation that is currently rate controlled. Afebrile.   Wheezing has reportedly improved following breathing treatment. However, in setting of worsening supplemental oxygen requirements, will repeat chest x-ray, and also check updated BNP as well as procalcitonin level.    Newton Pigg, DO Hospitalist

## 2023-11-10 NOTE — IPAL (Addendum)
I spoke with patient's daughter and son at the bedside, explained patient's condition of decompensated heart failure with worsening cardiorenal syndrome with poor prognosis.  They have decided to continue current medical therapy and change code status to DNR. Will add IV hydromorphone as needed for dyspnea or pain.

## 2023-11-10 NOTE — Progress Notes (Addendum)
0138: Patient's oxygen saturation decreased to around 88% and then would come up into the low 90's on 3L [place].  Patient wheezing at the time and was given an Albuterol neb, RT notified.   0154: J. Howerter with hospitalist was paged and notified of patient on an insulin gtt and cardizem gtt.  No urinary output since 7P and bladder scan doesn't show any urine. Hospitalist placed orders for portable chest x-ray and BNP/labs.    0209: Patient has less wheezing and oxygen saturation is 93% on 3L [place].   0228: Orders given to notify hospitalist if there is a significant change with increase in oxygen requirements.

## 2023-11-10 NOTE — Progress Notes (Addendum)
Progress Note   Patient: Christina Morgan ZOX:[id number] DOB: Nov 21, 1933 DOA: 11/08/2023     2 DOS: the patient was seen and examined on 11/10/2023   Brief hospital course: Christina Morgan was admitted to the hospital with the working diagnosis of atrial fibrillation with RVR in the setting of heart failure decompensation.   89 yo female with the past medical history of hypertension, hyperlipidemia, paroxysmal atrial fibrillation, and CKD who presented with worsening lower extremity edema. Worsening symptoms for 7 days, which progressed to dyspnea over the last 24 hrs prior to admission. Her diuretic dose was increased at SNF with no significant improvement in her symptoms. She was seen at [facility] ED, found volume overloaded and in atrial fibrillation with RVR, transferred to [facility] for further management.  His heart rate was 169 bpm, RR 18 to 29, blood pressure 136/97 and 02 saturation 97%, lungs with rales bilaterally with no wheezing or rhonchi, positive JVD, heart with S1 and S2 present, irregularly irregular with no gallops, abdomen not distended and positive lower extremity edema.   Na 144, K 3,6 Cl 104, bicarbonate 26, glucose 124, BUN 21 cr 1,29  BNP 871  High sensitive troponin 9 and 9  Wbc 8,3 hgb 10,8 plt 345  [place] covid 19 negative   Chest radiograph with cardiomegaly, bilateral hilar vascular congestion and cephalization of the vasculature. Bilateral pleural effusions.   EKG 155 bpm, right axis deviation, atrial fibrillation rhythm with poor R R wave progression, no significant ST segment or T wave changes.   [place] lower extremities negative for deep vein thrombosis.   Patient was placed on diltiazem infusion and IV furosemide.    11/29 patient with improvement in her symptoms with better rate controlled atrial fibrillation.   Assessment and Plan: * Acute on chronic diastolic CHF (congestive heart failure) (HCC) Echocardiogram with preserved LV systolic function with EF 55 to  60%, RV with preserved systolic function, RVSP 44,6 mmHg, LA with moderate dilatation, mild to moderate aortic valve regurgitation.   Acute on chronic core pulmonale.  Documented urine output is 525 cc Systolic blood  pressure 100 to 110 mmHg.   Not much diuresis with furosemide drip to 10 mg per hr, will increase to 15 mg per hr.  Continue with metoprolol 25 mg po tid  Will check chest radiograph this morning if continue to have pulmonary edema will add metolazone to augment diuresis.   PAF (paroxysmal atrial fibrillation) (HCC) Patient with atrial fibrillation with RVR.   In the past she has been on B blocker, flecainide and amiodarone, all discontinued due to patient converting to sinus rhythm with bradycardia. 04/2022 had direct current cardioversion, follow up 05/2022 patient was back in atrial fibrillation.   Telemetry continue atrial fibrillation/ atrial flutter rhythm with rate 70 to 80 bpm.   Currently on diltiazem infusion at 12,5 mg/hr  Continue with metoprolol 25 mg po tid. Continue telemetry monitoring.   Chronic kidney disease, stage 3b (HCC) Worsening renal function due to cardiorenal syndrome. Today continue with signs of volume overload.  Renal function with serum cr at 1,92 with K at 4,6 and serum bicarbonate at 23.  Na 139 P 5.8 Mg. 2.5  Plan to continue diuresis with furosemide IV drip and will probably add one dose of metolazone today.  Follow up renal function and electrolytes in am.  Will place a Foley catheter to for close monitoring urine output.   Essential hypertension, benign Continue blood pressure monitoring.  Aggressive diuresis with furosemide.  Continue  metoprolol for rate control atrial fibrillation.  GERD (gastroesophageal reflux disease) Continue pantoprazole.  Dyslipidemia Continue statin therapy.     Subjective: patient with persistent dyspnea, no chest pain, this morning with confusion and disorientation   Physical Exam: Vitals:    11/10/23 0150 11/10/23 0428 11/10/23 0800 11/10/23 0844  BP: 121/65 (!) 109/58  119/80  Pulse:  (!) 54 (!) 57 (!) 59  Resp: 18 (!) 21 (!) 21 18  Temp: (!) 97.5 F (36.4 C) (!) 97.5 F (36.4 C)    TempSrc: Oral Oral    SpO2: 91% 99% 96% 97%  Weight:  77.9 kg    Height:       Neurology awake and alert, deconditioned and ill looking appearing  ENT with mild pallor Cardiovascular with S1 and S2 present, irregularly irregular with no gallops, rubs or murmurs Positive moderate JVD Positive lower extremity edema ++ on the right lower extremity + on the left Respiratory with positive rales and rhonchi with no wheezing Abdomen with no distention  Data Reviewed:    Family Communication: no family at the bedside   Disposition: Status is: Inpatient Remains inpatient appropriate because: IV diuresis   Planned Discharge Destination: Skilled nursing facility     Author: Coralie Keens, MD 11/10/2023 10:15 AM  For on call review [email or web address].

## 2023-11-10 NOTE — Progress Notes (Signed)
Progress Note  Patient Name: Christina Morgan Date of Encounter: 11/10/2023  [facility] Cardiologist: Armanda Magic, MD   Patient Profile     Subjective   Had reduced UOP last night with negative bladder scan and desats to 88% and given albuterol neb.  BNP elevated at 1299 and Cxray last night showed pulmonary vascular congestion.  Has some SOB.  No CP.  Remains in afib on tele with CVR  Inpatient Medications    Scheduled Meds:  apixaban  5 mg Oral BID   atorvastatin  10 mg Oral QHS   metoprolol tartrate  25 mg Oral TID   multivitamin with minerals  1 tablet Oral Daily   pantoprazole  40 mg Oral Daily   sodium chloride flush  3 mL Intravenous Q12H   Continuous Infusions:  diltiazem (CARDIZEM) infusion 15 mg/hr (11/09/23 2346)   furosemide (LASIX) 200 mg in dextrose 5 % 100 mL (2 mg/mL) infusion 10 mg/hr (11/09/23 2347)   PRN Meds: acetaminophen **OR** acetaminophen, albuterol   Vital Signs    Vitals:   11/09/23 1941 11/10/23 0016 11/10/23 0150 11/10/23 0428  BP: (!) 121/59 115/64 121/65 (!) 109/58  Pulse: (!) 45 (!) 59  (!) 54  Resp: 20  18 (!) 21  Temp: 97.6 F (36.4 C) 97.6 F (36.4 C) (!) 97.5 F (36.4 C) (!) 97.5 F (36.4 C)  TempSrc: Oral Oral Oral Oral  SpO2: 93% 91% 91% 99%  Weight:    77.9 kg  Height:        Intake/Output Summary (Last 24 hours) at 11/10/2023 0748 Last data filed at 11/10/2023 0600 Gross per 24 hour  Intake 807.69 ml  Output 525 ml  Net 282.69 ml      11/10/2023    4:28 AM 11/09/2023    3:47 AM 11/08/2023   12:13 PM  Last 3 Weights  Weight (lbs) 171 lb 11.8 oz 166 lb 7.2 oz 166 lb  Weight (kg) 77.9 kg 75.5 kg 75.297 kg      Telemetry    Atrial fibrillation with CVR - Personally Reviewed  ECG    No new EKG to review - Personally Reviewed  Physical Exam   GEN: No acute distress.   Neck: No JVD Cardiac: irregularly irregular, no murmurs, rubs, or gallops.  Respiratory: decreased BS at bases with faint  crackles GI: Soft, nontender, non-distended  MS: No edema; No deformity. Neuro:  Nonfocal  Psych: Normal affect   Labs    High Sensitivity Troponin:   Recent Labs  Lab 11/08/23 1223 11/08/23 1423  TROPONINIHS 9 9      Chemistry Recent Labs  Lab 11/08/23 1223 11/09/23 0230 11/10/23 0244  NA 144 142 139  K 3.6 3.6 4.6  CL 104 107 104  CO2 26 27 23   GLUCOSE 124* 111* 150*  BUN 21 16 27*  CREATININE 1.29* 1.42* 1.92*  CALCIUM 10.0 9.1 9.5  PROT 7.0  --   --   ALBUMIN 4.1  --   --   AST 17  --   --   ALT 13  --   --   ALKPHOS 102  --   --   BILITOT 0.7  --   --   GFRNONAA 40* 36* 25*  ANIONGAP 14 8 12      Hematology Recent Labs  Lab 11/08/23 1223 11/09/23 0230  WBC 8.3 5.5  RBC 4.07 3.87  HGB 10.8* 10.0*  HCT 33.8* 31.9*  MCV 83.0 82.4  MCH  26.5 25.8*  MCHC 32.0 31.3  RDW 16.3* 16.1*  PLT 345 310    BNP Recent Labs  Lab 11/08/23 1223 11/10/23 0244  BNP 871.9* 1,299.8*     DDimer No results for input(s): "DDIMER" in the last 168 hours.   CHA2DS2-VASc Score = 5   This indicates a 7.2% annual risk of stroke. The patient's score is based upon: CHF History: 1 HTN History: 1 Diabetes History: 0 Stroke History: 0 Vascular Disease History: 0 Age Score: 2 Gender Score: 1      Radiology    DG Chest Port 1 View  Result Date: 11/10/2023 CLINICAL DATA:  [id number] with shortness of breath and tachycardia. EXAM: PORTABLE CHEST 1 VIEW COMPARISON:  Portable chest 11/08/2023 FINDINGS: 1:49 a.m. Cardiomegaly is stable. There is increased perihilar vascular congestion, and increased lower zonal interstitial edema. There are increased moderate layering pleural effusions, with increased overlying atelectasis or consolidation in the lower lung fields. The upper lung fields show no focal opacity. Stable mediastinum with aortic tortuosity and atherosclerosis. No new osseous abnormality. IMPRESSION: 1. Increased perihilar vascular congestion and lower zonal  interstitial edema. 2. Increased moderate layering pleural effusions, with increased overlying atelectasis or consolidation in the lower lung fields. 3. Stable cardiomegaly. Electronically Signed   By: Almira Bar M.D.   On: 11/10/2023 02:13   ECHOCARDIOGRAM COMPLETE  Result Date: 11/09/2023    ECHOCARDIOGRAM REPORT   Patient Name:   Christina Morgan Date of Exam: 11/09/2023 Medical Rec #:  [id number]           Height:       64.0 in Accession #:    [id number]          Weight:       166.4 lb Date of Birth:  04/29/1933            BSA:          1.809 m Patient Age:    89 years            BP:           121/82 mmHg Patient Gender: F                   HR:           120 bpm. Exam Location:  Inpatient Procedure: 2D Echo, Cardiac Doppler, Color Doppler and Intracardiac            Opacification Agent Indications:    CHF-Acute Diastolic I50.31  History:        Patient has prior history of Echocardiogram examinations, most                 recent 01/03/2022. Arrythmias:Atrial Fibrillation; Risk                 Factors:Hypertension.  Sonographer:    Darlys Gales Referring Phys: [id number] RONDELL A SMITH IMPRESSIONS  1. Left ventricular ejection fraction, by estimation, is 55 to 60%. The left ventricle has normal function. The left ventricle has no regional wall motion abnormalities. Indeterminate diastolic filling due to E-A fusion.  2. Right ventricular systolic function is normal. The right ventricular size is normal. There is mildly elevated pulmonary artery systolic pressure. The estimated right ventricular systolic pressure is 44.6 mmHg.  3. Left atrial size was moderately dilated.  4. The mitral valve is abnormal. Mild mitral valve regurgitation. No evidence of mitral stenosis. Severe mitral annular calcification.  5. The aortic valve has an indeterminant number of  cusps. Aortic valve regurgitation is mild to moderate. No aortic stenosis is present.  6. The inferior vena cava is dilated in size with <50% respiratory  variability, suggesting right atrial pressure of 15 mmHg. FINDINGS  Left Ventricle: Left ventricular ejection fraction, by estimation, is 55 to 60%. The left ventricle has normal function. The left ventricle has no regional wall motion abnormalities. The left ventricular internal cavity size was normal in size. There is  no left ventricular hypertrophy. Indeterminate diastolic filling due to E-A fusion. Right Ventricle: The right ventricular size is normal. No increase in right ventricular wall thickness. Right ventricular systolic function is normal. There is mildly elevated pulmonary artery systolic pressure. The tricuspid regurgitant velocity is 2.72  m/s, and with an assumed right atrial pressure of 15 mmHg, the estimated right ventricular systolic pressure is 44.6 mmHg. Left Atrium: Left atrial size was moderately dilated. Right Atrium: Right atrial size was normal in size. Pericardium: There is no evidence of pericardial effusion. Mitral Valve: The mitral valve is abnormal. Severe mitral annular calcification. Mild mitral valve regurgitation. No evidence of mitral valve stenosis. Tricuspid Valve: The tricuspid valve is normal in structure. Tricuspid valve regurgitation is mild . No evidence of tricuspid stenosis. Aortic Valve: The aortic valve has an indeterminant number of cusps. Aortic valve regurgitation is mild to moderate. Aortic regurgitation PHT measures 677 msec. No aortic stenosis is present. Aortic valve mean gradient measures 5.8 mmHg. Aortic valve peak gradient measures 9.7 mmHg. Aortic valve area, by VTI measures 1.42 cm. Pulmonic Valve: The pulmonic valve was normal in structure. Pulmonic valve regurgitation is mild. No evidence of pulmonic stenosis. Aorta: The aortic root is normal in size and structure. Venous: The inferior vena cava is dilated in size with less than 50% respiratory variability, suggesting right atrial pressure of 15 mmHg. IAS/Shunts: No atrial level shunt detected by color  flow Doppler.  LEFT VENTRICLE PLAX 2D LVIDd:         4.50 cm LVIDs:         3.50 cm LV PW:         1.00 cm LV IVS:        0.95 cm LVOT diam:     1.80 cm LV SV:         40 LV SV Index:   22 LVOT Area:     2.54 cm  LEFT ATRIUM              Index        RIGHT ATRIUM           Index LA Vol (A2C):   106.0 ml 58.58 ml/m  RA Area:     19.50 cm LA Vol (A4C):   73.9 ml  40.84 ml/m  RA Volume:   53.30 ml  29.46 ml/m LA Biplane Vol: 89.0 ml  49.19 ml/m  AORTIC VALVE AV Area (Vmax):    1.46 cm AV Area (Vmean):   1.38 cm AV Area (VTI):     1.42 cm AV Vmax:           155.60 cm/s AV Vmean:          115.740 cm/s AV VTI:            0.280 m AV Peak Grad:      9.7 mmHg AV Mean Grad:      5.8 mmHg LVOT Vmax:         89.30 cm/s LVOT Vmean:        62.550  cm/s LVOT VTI:          0.156 m LVOT/AV VTI ratio: 0.56 AI PHT:            677 msec MITRAL VALVE                TRICUSPID VALVE MV Area (PHT): 5.93 cm     TR Peak grad:   29.6 mmHg MV Decel Time: 128 msec     TR Vmax:        272.00 cm/s MV E velocity: 124.00 cm/s                             SHUNTS                             Systemic VTI:  0.16 m                             Systemic Diam: 1.80 cm Vishnu Priya Mallipeddi Electronically signed by Winfield Rast Mallipeddi Signature Date/Time: 11/09/2023/11:05:02 AM    Final    DG Chest Port 1 View  Result Date: 11/08/2023 CLINICAL DATA:  Shortness of breath.  History of CHF. EXAM: PORTABLE CHEST 1 VIEW COMPARISON:  03/06/2022 FINDINGS: Unchanged enlarged cardiac silhouette and mediastinal contours with atherosclerotic plaque within the thoracic aorta. There is persistent rightward deviation of the tracheal air column at the level of the aortic arch. Unchanged small layering bilateral effusions and associated bibasilar opacities. Pulmonary vasculature indistinct with cephalization flow. No pneumothorax. No acute osseous abnormalities. IMPRESSION: Similar findings of cardiomegaly, pulmonary edema and small layering bilateral  effusions. Electronically Signed   By: Simonne Come M.D.   On: 11/08/2023 13:35   US Venous Img Lower Bilateral  Result Date: 11/08/2023 CLINICAL DATA:  Bilateral lower extremity edema, right-greater-than-left. Evaluate for DVT. EXAM: BILATERAL LOWER EXTREMITY VENOUS DOPPLER ULTRASOUND TECHNIQUE: Gray-scale sonography with graded compression, as well as color Doppler and duplex ultrasound were performed to evaluate the lower extremity deep venous systems from the level of the common femoral vein and including the common femoral, femoral, profunda femoral, popliteal and calf veins including the posterior tibial, peroneal and gastrocnemius veins when visible. The superficial great saphenous vein was also interrogated. Spectral Doppler was utilized to evaluate flow at rest and with distal augmentation maneuvers in the common femoral, femoral and popliteal veins. COMPARISON:  None Available. FINDINGS: RIGHT LOWER EXTREMITY Common Femoral Vein: No evidence of thrombus. Normal compressibility, respiratory phasicity and response to augmentation. Saphenofemoral Junction: No evidence of thrombus. Normal compressibility and flow on color Doppler imaging. Profunda Femoral Vein: No evidence of thrombus. Normal compressibility and flow on color Doppler imaging. Femoral Vein: No evidence of thrombus. Normal compressibility, respiratory phasicity and response to augmentation. Popliteal Vein: No evidence of thrombus. Normal compressibility, respiratory phasicity and response to augmentation. Calf Veins: No evidence of thrombus. Normal compressibility and flow on color Doppler imaging. Superficial Great Saphenous Vein: No evidence of thrombus. Normal compressibility. Other Findings: There is a moderate amount of subcutaneous edema at the level of the right lower leg and calf. LEFT LOWER EXTREMITY Common Femoral Vein: No evidence of thrombus. Normal compressibility, respiratory phasicity and response to augmentation.  Saphenofemoral Junction: No evidence of thrombus. Normal compressibility and flow on color Doppler imaging. Profunda Femoral Vein: No evidence of thrombus. Normal compressibility and flow on color Doppler imaging. Femoral  Vein: No evidence of thrombus. Normal compressibility, respiratory phasicity and response to augmentation. Popliteal Vein: No evidence of thrombus. Normal compressibility, respiratory phasicity and response to augmentation. Calf Veins: No evidence of thrombus. Normal compressibility and flow on color Doppler imaging. Superficial Great Saphenous Vein: No evidence of thrombus. Normal compressibility. Other Findings: There is a moderate amount of subcutaneous edema at the level of the left lower leg and calf. IMPRESSION: No evidence of DVT within either lower extremity. Electronically Signed   By: Simonne Come M.D.   On: 11/08/2023 13:34    Patient Profile     89 y.o. female known to have chronic diastolic heart failure, paroxysmal A-fib on AC, HTN, HLD, moderate AI presented to the ER with DOE, orthopnea and bilateral lower extremity swelling x few days prior to presentation.  No angina, dizziness or syncope.  EKG shows A-fib with RVR, HR 140-150s.  BNP elevated, 871.9.  Serum creatinine mildly increased today, 1.42 compared to 1.29 yesterday.  Received IV Lasix 40 mg 2 doses yesterday.   Echo performed today showed normal LVEF, normal RV function, mild MR, mild to moderate AI, CVP 15 mmHg.    Assessment & Plan    1,  Acute diastolic CHF -likely related to afib with RVR -decreased UOP overnight with negative bladder scan -now on Lasix gtt -she has put out 525cc and is net neg 81cc since admit -SCr increased from 1.42 to 1.92 likely related to acute CHF -Cxray with vascular congestion and BNP elevated at 1300 -continue IV Lasix gtt -follow strict I&O's and daily weights and renal function while diuresing -2D echo this admit with normal LVF and mild to moderate AI, CVP -TSH  2.5 -continue apixaban 5mg  BID, Lopressor 25mg  TID and Cardizem gtt for now while diuresing and then transition to PO  2.  Afib with RVR -has hx of PAF in the past -admitted with afib RVR -HR now better controlled in the 70's  3.  AKI  -suspect related to acute CHF and should improve with diuresis -continue to follow daily weights  4. Aortic Insufficiency -mild to moderate on echo this admi -follow yearly with echo outpt  5.  HTN -BP controlled on exam today -continue Lopressor 25mg  TID -continue Cardizem gtt for now and transition to oral prior to discharge (was on amlodipine outpt)  I have personally spent 30 minutes involved in face-to-face and non-face-to-face activities for this patient on the day of the visit. Professional time spent includes the following activities: Preparing to see the patient (review of tests), Obtaining and/or reviewing separately obtained history (admission/discharge record), Performing a medically appropriate examination and/or evaluation , Ordering medications/tests/procedures, referring and communicating with other health care professionals, Documenting clinical information in the EMR, Independently interpreting results (not separately reported), Communicating results to the patient/family/caregiver, Counseling and educating the patient/family/caregiver and Care coordination (not separately reported).       For questions or updates, please contact [facility] Please consult [email or web address] for contact info under        Signed, Armanda Magic, MD  11/10/2023, 7:48 AM

## 2023-11-10 NOTE — TOC Initial Note (Signed)
Transition of Care [facility]) - Initial/Assessment Note    Patient Details  Name: Christina Morgan MRN: [id number] Date of Birth: 1933-08-12  Transition of Care [facility]) CM/SW Contact:    [name], LCSWA Phone Number: 11/10/2023, 3:22 PM  Clinical Narrative:   CSW met pts dtr to gather information. Pts dtr, Rayburn Felt, states her mom lives at [facility]. Linea states that the ALF has a PCP on staff and have their own pharmacy for residents. Pt has the following DME: wheelchair and a walker. Pt will need ambulance transport at time of dc.      Barriers to Discharge: Continued Medical Work up   Patient Goals and CMS Choice Patient states their goals for this hospitalization and ongoing recovery are:: return to ALF          Expected Discharge Plan and Services       Living arrangements for the past 2 months: Assisted Living Facility                 DME Arranged: Environmental consultant, Wheelchair manual                    Prior Living Arrangements/Services Living arrangements for the past 2 months: Assisted Living Facility Lives with:: Facility Resident Patient language and need for interpreter reviewed:: Yes Do you feel safe going back to the place where you live?: Yes      Need for Family Participation in Patient Care: No (Comment) Care giver support system in place?: No (comment) Current home services: DME (walker, wheelchair) Criminal Activity/Legal Involvement Pertinent to Current Situation/Hospitalization: No - Comment as needed  Activities of Daily Living   ADL Screening (condition at time of admission) Independently performs ADLs?: No Does the patient have a NEW difficulty with bathing/dressing/toileting/self-feeding that is expected to last >3 days?: No (pt is W/C bound at Assisted Living) Does the patient have a NEW difficulty with getting in/out of bed, walking, or climbing stairs that is expected to last >3 days?: No Does the patient have a NEW  difficulty with communication that is expected to last >3 days?: No Is the patient deaf or have difficulty hearing?: No Does the patient have difficulty seeing, even when wearing glasses/contacts?: No Does the patient have difficulty concentrating, remembering, or making decisions?: No  Permission Sought/Granted                  Emotional Assessment Appearance:: Other (Comment Required (Unable to assess, spoke with pts dtr outside of room) Attitude/Demeanor/Rapport: Unable to Assess Affect (typically observed): Unable to Assess Orientation: : Oriented to Self Alcohol / Substance Use: Not Applicable Psych Involvement: No (comment)  Admission diagnosis:  Acute pulmonary edema (HCC) [J81.0] Atrial fibrillation with rapid ventricular response (HCC) [I48.91] Acute on chronic diastolic CHF (congestive heart failure) (HCC) [I50.33] Acute on chronic congestive heart failure, unspecified heart failure type [facility]) [I50.9] Patient Active Problem List   Diagnosis Date Noted   Pressure injury of deep tissue of left heel 03/11/2022   Acute metabolic encephalopathy 03/09/2022   Acute lower UTI 03/09/2022   Thrombocytopenia (HCC) 03/09/2022   Elevated transaminase level 03/09/2022   Dyslipidemia    History of anemia due to chronic kidney disease    GERD (gastroesophageal reflux disease)    Fluid overload 03/06/2022   AKI superimposed on stage 3b chronic kidney disease (HCC) 01/02/2022   Symptomatic bradycardia 11/29/2021   Bacteremia due to Gram-positive bacteria 02/13/2020   Acute encephalopathy 02/11/2020   Cervical spine  fracture (HCC) 02/11/2020   CAP (community acquired pneumonia) 02/11/2020   Chronic diastolic congestive heart failure (HCC)    Chronic kidney disease, stage 3b (HCC)    PAF (paroxysmal atrial fibrillation) (HCC)    Closed displaced intertrochanteric fracture of right femur (HCC)    Fall 09/23/2019   SOB (shortness of breath) 01/04/2019   Bilateral lower leg  cellulitis 03/30/2018   Acute on chronic diastolic CHF (congestive heart failure) (HCC) 03/30/2018   Edema of extremities 08/19/2014   Essential hypertension, benign 05/15/2014   Mixed hyperlipidemia 05/15/2014   Persistent atrial fibrillation (HCC) 05/15/2014   Endometrial ca (HCC) 12/28/2012   PCP:  Almetta Lovely, [facility]:   [facility] - [place], [place] - [address] [place] [address] Phone: [phone] Fax: [phone]     Social Determinants of Health (SDOH) Social History: SDOH Screenings   Food Insecurity: No Food Insecurity (11/09/2023)  Housing: Low Risk  (11/09/2023)  Transportation Needs: No Transportation Needs (11/09/2023)  Utilities: Not At Risk (11/09/2023)  Financial Resource Strain: Low Risk  (09/27/2019)  Physical Activity: Inactive (09/27/2019)  Social Connections: Unknown (04/15/2022)   Received from [facility], [facility]  Stress: No Stress Concern Present (09/27/2019)  Tobacco Use: Low Risk  (11/08/2023)   SDOH Interventions:     Readmission Risk Interventions    03/11/2022   11:40 AM  Readmission Risk Prevention Plan  Transportation Screening Complete  Medication Review (RN Care Manager) Complete  PCP or Specialist appointment within 3-5 days of discharge Complete  HRI or Home Care Consult Complete  SW Recovery Care/Counseling Consult Complete  Palliative Care Screening Not Applicable  Skilled Nursing Facility Not Applicable

## 2023-11-10 NOTE — Progress Notes (Signed)
Notified provider foley unable to be placed by nursing staff and bladder scan still demonstrating 0 mL in bladder, with no measurable output for entirety of shift. Provider okay with keeping foley out and continuing to bladder scan. Patient without any complaint's at this time. Will continue to monitor.

## 2023-11-11 DIAGNOSIS — I5033 Acute on chronic diastolic (congestive) heart failure: Secondary | ICD-10-CM | POA: Diagnosis not present

## 2023-11-11 DIAGNOSIS — I10 Essential (primary) hypertension: Secondary | ICD-10-CM | POA: Diagnosis not present

## 2023-11-11 DIAGNOSIS — I480 Paroxysmal atrial fibrillation: Secondary | ICD-10-CM | POA: Diagnosis not present

## 2023-11-11 DIAGNOSIS — N1832 Chronic kidney disease, stage 3b: Secondary | ICD-10-CM | POA: Diagnosis not present

## 2023-11-11 LAB — BASIC METABOLIC PANEL
Anion gap: 10 (ref 5–15)
BUN: 40 mg/dL — ABNORMAL HIGH (ref 8–23)
CO2: 25 mmol/L (ref 22–32)
Calcium: 9.4 mg/dL (ref 8.9–10.3)
Chloride: 105 mmol/L (ref 98–111)
Creatinine, Ser: 2.17 mg/dL — ABNORMAL HIGH (ref 0.44–1.00)
GFR, Estimated: 21 mL/min — ABNORMAL LOW (ref >60)
Glucose, Bld: 127 mg/dL — ABNORMAL HIGH (ref 70–99)
Potassium: 4.8 mmol/L (ref 3.5–5.1)
Sodium: 140 mmol/L (ref 135–145)

## 2023-11-11 LAB — MAGNESIUM: Magnesium: 2.6 mg/dL — ABNORMAL HIGH (ref 1.7–2.4)

## 2023-11-11 MED ORDER — APIXABAN 2.5 MG PO TABS
2.5000 mg | ORAL_TABLET | Freq: Two times a day (BID) | ORAL | Status: DC
  Administered 2023-11-11 – 2023-11-22 (×22): 2.5 mg via ORAL
  Filled 2023-11-11 (×22): qty 1

## 2023-11-11 MED ORDER — METOPROLOL TARTRATE 25 MG PO TABS
25.0000 mg | ORAL_TABLET | Freq: Two times a day (BID) | ORAL | Status: DC
  Administered 2023-11-11: 25 mg via ORAL
  Filled 2023-11-11: qty 1

## 2023-11-11 MED ORDER — DILTIAZEM HCL 60 MG PO TABS
120.0000 mg | ORAL_TABLET | Freq: Two times a day (BID) | ORAL | Status: DC
  Administered 2023-11-11 – 2023-11-14 (×8): 120 mg via ORAL
  Filled 2023-11-11 (×9): qty 2

## 2023-11-11 NOTE — Progress Notes (Signed)
Cardiology Progress Note  Patient ID: Christina Morgan MRN: [id number] DOB: [birth year] Date of Encounter: 11/11/2023 Primary Cardiologist: Armanda Magic, MD  Subjective   Chief Complaint: SOB  HPI: Serum creatinine continues to rise.  Urine output diminished.  A-fib is stable.  Transition to DNR limited.  ROS:  All other ROS reviewed and negative. Pertinent positives noted in the HPI.     Telemetry  Overnight telemetry shows A-fib 60 to 70 bpm, which I personally reviewed.    Physical Exam   Vitals:   11/10/23 2000 11/11/23 0544 11/11/23 0640 11/11/23 0820  BP: 131/65 122/63    Pulse: 71 60 71 71  Resp: 20  (!) 27 (!) 21  Temp: 97.8 F (36.6 C)     TempSrc: Oral     SpO2: 90% (!) 86% 96% 99%  Weight:  76.9 kg    Height:        Intake/Output Summary (Last 24 hours) at 11/11/2023 0842 Last data filed at 11/11/2023 0550 Gross per 24 hour  Intake 473.25 ml  Output 550 ml  Net -76.75 ml       11/11/2023    5:44 AM 11/10/2023    4:28 AM 11/09/2023    3:47 AM  Last 3 Weights  Weight (lbs) 169 lb 8.5 oz 171 lb 11.8 oz 166 lb 7.2 oz  Weight (kg) 76.9 kg 77.9 kg 75.5 kg    Body mass index is 29.1 kg/m.  General: Ill-appearing Head: Atraumatic, normal size  Eyes: PEERLA, EOMI  Neck: Supple, JVD 12 to 15 cm of water Endocrine: No thryomegaly Cardiac: Normal S1, S2; irregular rhythm, no murmurs Lungs: Crackles at the lung bases Abd: Soft, nontender, no hepatomegaly  Ext: No edema, pulses 2+ Musculoskeletal: No deformities, BUE and BLE strength normal and equal Skin: Warm and dry, no rashes   Neuro: Drowsy this morning, alert  Cardiac Studies  TTE 11/09/2023  1. Left ventricular ejection fraction, by estimation, is 55 to 60%. The  left ventricle has normal function. The left ventricle has no regional  wall motion abnormalities. Indeterminate diastolic filling due to E-A  fusion.   2. Right ventricular systolic function is normal. The right ventricular  size  is normal. There is mildly elevated pulmonary artery systolic  pressure. The estimated right ventricular systolic pressure is 44.6 mmHg.   3. Left atrial size was moderately dilated.   4. The mitral valve is abnormal. Mild mitral valve regurgitation. No  evidence of mitral stenosis. Severe mitral annular calcification.   5. The aortic valve has an indeterminant number of cusps. Aortic valve  regurgitation is mild to moderate. No aortic stenosis is present.   6. The inferior vena cava is dilated in size with <50% respiratory  variability, suggesting right atrial pressure of 15 mmHg.   Patient Profile  Christina Morgan is a 89 y.o. female with HFpEF, paroxysmal atrial fibrillation, hypertension, hyperlipidemia admitted on 11/08/2023 for acute on chronic HFpEF complicated by A-fib with RVR.  Assessment & Plan   # Acute on chronic diastolic heart failure, EF 55 to 60% # Pulmonary hypertension # AKI -Urine output suboptimal.  Creatinine continues to rise.  Stop Lasix..  Will give her Lasix holiday today.  We will resume diuresis once creatinine improves. -Workup for AKI is pending.  Renal ultrasound is also pending. -Still volume up. -I am stopping her drips to see if this can help with her volume status. -Ongoing discussions with family.  Currently DNR.  We will see how  she does.  She may end up progressing to hospice needs.  # Persistent atrial fibrillation -Stop diltiazem drip.  We will order 120 mg of diltiazem immediate release twice daily.  Continue metoprolol to tartrate but consolidate to 25 mg twice daily. -Hopefully getting off drips will improve volume status. -Continue Eliquis.  # Goals of care -Ongoing discussions with family and hospital team.  Currently DNR with limited scope.  For questions or updates, please contact [facility] Please consult [email or web address] for contact info under   Signed, Gerri Spore T. Flora Lipps, MD, [facility] [place]  [facility]   11/11/2023 8:42 AM

## 2023-11-11 NOTE — Progress Notes (Signed)
Patient's HR noted to go down to 30's and recover quickly. Otherwise, vital signs stable. Notified Dr. Ella Jubilee. Will hold metoprolol as ordered.

## 2023-11-11 NOTE — Plan of Care (Signed)
Patient progressing on all care plans. Remain on HFNC to maintain adequate oxygenation. Patient more alert this evening and able to eat a few bites of dinner. Patient and family educated on plan of care.

## 2023-11-11 NOTE — Progress Notes (Signed)
RT called to bedside after desaturation of 84%, patient initially placed on 15 salter without any improvement. Patient was placed on heated high flow of 35L/80% patient with immediate improvement in oxygen saturation to 100%.

## 2023-11-11 NOTE — Progress Notes (Signed)
Progress Note   Patient: Christina Morgan GUY:[id number] DOB: [birth year] DOA: 11/08/2023     3 DOS: the patient was seen and examined on 11/11/2023   Brief hospital course: Mrs. Behner was admitted to the hospital with the working diagnosis of atrial fibrillation with RVR in the setting of heart failure decompensation.   89 yo female with the past medical history of hypertension, hyperlipidemia, paroxysmal atrial fibrillation, and CKD who presented with worsening lower extremity edema. Worsening symptoms for 7 days, which progressed to dyspnea over the last 24 hrs prior to admission. Her diuretic dose was increased at SNF with no significant improvement in her symptoms. She was seen at [facility] ED, found volume overloaded and in atrial fibrillation with RVR, transferred to [facility] for further management.  His heart rate was 169 bpm, RR 18 to 29, blood pressure 136/97 and 02 saturation 97%, lungs with rales bilaterally with no wheezing or rhonchi, positive JVD, heart with S1 and S2 present, irregularly irregular with no gallops, abdomen not distended and positive lower extremity edema.   Na 144, K 3,6 Cl 104, bicarbonate 26, glucose 124, BUN 21 cr 1,29  BNP 871  High sensitive troponin 9 and 9  Wbc 8,3 hgb 10,8 plt 345  Sars covid 19 negative   Chest radiograph with cardiomegaly, bilateral hilar vascular congestion and cephalization of the vasculature. Bilateral pleural effusions.   EKG 155 bpm, right axis deviation, atrial fibrillation rhythm with poor R R wave progression, no significant ST segment or T wave changes.   [place] lower extremities negative for deep vein thrombosis.   Patient was placed on diltiazem infusion and IV furosemide.    11/29 patient with improvement in her symptoms with better rate controlled atrial fibrillation.  11/30 worsening renal function. She has developed acute metabolic encephalopathy.   Assessment and Plan: * Acute on chronic diastolic CHF (congestive heart  failure) (HCC) Echocardiogram with preserved LV systolic function with EF 55 to 60%, RV with preserved systolic function, RVSP 44,6 mmHg, LA with moderate dilatation, mild to moderate aortic valve regurgitation.   Acute on chronic core pulmonale.  Documented urine output is 550 cc Systolic blood  pressure 120's  mmHg.  11/29 follow up chest radiograph with persistent signs of pulmonary edema.   Patient has not responded to diuretic therapy as expected.   Plan to continue metoprolol and rate control atrial fibrillation with diltiazem.  Poor prognosis, if continue to deteriorate will need further comfort care measures.   Acute hypoxemic respiratory failure due to acute cardiogenic pulmonary edema.  Worsening oxygenation and increases 02 demands. Today she is on heated high flow nasal cannula 25 L/min with Fi02 70%, with 02 saturation 99%.   Acute metabolic encephalopathy, she has become less responsive today.  Poor prognosis.   PAF (paroxysmal atrial fibrillation) (HCC) Telemetry with atrial fibrillation/ atrial flutter in the 70 bpm range.   In the past she has been on B blocker, flecainide and amiodarone, all discontinued due to patient converting to sinus rhythm with bradycardia. 04/2022 had direct current cardioversion, follow up 05/2022 patient was back in atrial fibrillation.   Continue rate control with oral diltiazem 120 mg bid and metoprolol 25 mg po bid.  Anticoagulation with apixaban.  Continue telemetry monitoring.   Chronic kidney disease, stage 3b (HCC) AKI   Worsening renal function due to cardiorenal syndrome. Low urine output despite aggressive diuresis.   Renal function today with serum cr at 2,17 with K at 4,8 and serum bicarbonate at 25.  Na 140 and Mg 2.6  Renal US with with increases echogenicity of the bilateral renal cortices, consistent with medical renal disease.  Moderate left hydronephrosis.   Yesterday not able to place foley catheter.   Plan for  close follow up renal function and electrolytes Diuretics on hold for today.   Essential hypertension, benign Continue blood pressure monitoring.  Continue with metoprolol and diltiazem.   GERD (gastroesophageal reflux disease) Continue pantoprazole.  Dyslipidemia Continue statin therapy.         Subjective: Patient is less reactive today, eyes closes, positive dyspnea. She has been placed on heated high flow nasal cannula.   Physical Exam: Vitals:   11/11/23 0640 11/11/23 0820 11/11/23 1200 11/11/23 1203  BP:   128/64 128/64  Pulse: 71 71 77   Resp: (!) 27 (!) 21    Temp:      TempSrc:      SpO2: 96% 99%    Weight:      Height:       Neurology, eyes closed, responds to voice, following simple commands and answering to simple questions Cardiovascular with S1 and S2 present, irregularly irregular with no gallops or rubs, no murmurs Positive JVD Lower extremity edema more right than left  Respiratory with poor inspiratory effort, with rales at bases with no wheezing.  Abdomen with no distention  Data Reviewed:    Family Communication: I spoke with patient's daughter at the bedside, we talked in detail about patient's condition, plan of care and prognosis and all questions were addressed.   Disposition: Status is: Inpatient Remains inpatient appropriate because: heart failure management, poor prognosis, worsening renal function   Planned Discharge Destination: Skilled nursing facility     Author: Coralie Keens, MD 11/11/2023 12:24 PM  For on call review [email or web address].

## 2023-11-12 DIAGNOSIS — N1832 Chronic kidney disease, stage 3b: Secondary | ICD-10-CM | POA: Diagnosis not present

## 2023-11-12 DIAGNOSIS — I5033 Acute on chronic diastolic (congestive) heart failure: Secondary | ICD-10-CM | POA: Diagnosis not present

## 2023-11-12 DIAGNOSIS — I10 Essential (primary) hypertension: Secondary | ICD-10-CM | POA: Diagnosis not present

## 2023-11-12 DIAGNOSIS — I480 Paroxysmal atrial fibrillation: Secondary | ICD-10-CM | POA: Diagnosis not present

## 2023-11-12 LAB — RENAL FUNCTION PANEL
Albumin: 3.1 g/dL — ABNORMAL LOW (ref 3.5–5.0)
Anion gap: 13 (ref 5–15)
BUN: 48 mg/dL — ABNORMAL HIGH (ref 8–23)
CO2: 29 mmol/L (ref 22–32)
Calcium: 9.1 mg/dL (ref 8.9–10.3)
Chloride: 102 mmol/L (ref 98–111)
Creatinine, Ser: 2.53 mg/dL — ABNORMAL HIGH (ref 0.44–1.00)
GFR, Estimated: 18 mL/min — ABNORMAL LOW (ref >60)
Glucose, Bld: 96 mg/dL (ref 70–99)
Phosphorus: 7.2 mg/dL — ABNORMAL HIGH (ref 2.5–4.6)
Potassium: 3.9 mmol/L (ref 3.5–5.1)
Sodium: 144 mmol/L (ref 135–145)

## 2023-11-12 MED ORDER — BISACODYL 5 MG PO TBEC
5.0000 mg | DELAYED_RELEASE_TABLET | Freq: Once | ORAL | Status: AC
  Administered 2023-11-12: 5 mg via ORAL
  Filled 2023-11-12: qty 1

## 2023-11-12 NOTE — Progress Notes (Signed)
Progress Note   Patient: Christina Morgan ZOX:[id number] DOB: 1933-04-08 DOA: 11/08/2023     4 DOS: the patient was seen and examined on 11/12/2023   Brief hospital course: Mrs. Douthitt was admitted to the hospital with the working diagnosis of atrial fibrillation with RVR in the setting of heart failure decompensation.   89 yo female with the past medical history of hypertension, hyperlipidemia, paroxysmal atrial fibrillation, and CKD who presented with worsening lower extremity edema. Worsening symptoms for 7 days, which progressed to dyspnea over the last 24 hrs prior to admission. Her diuretic dose was increased at SNF with no significant improvement in her symptoms. She was seen at [facility] ED, found volume overloaded and in atrial fibrillation with RVR, transferred to [facility] for further management.  His heart rate was 169 bpm, RR 18 to 29, blood pressure 136/97 and 02 saturation 97%, lungs with rales bilaterally with no wheezing or rhonchi, positive JVD, heart with S1 and S2 present, irregularly irregular with no gallops, abdomen not distended and positive lower extremity edema.   Na 144, K 3,6 Cl 104, bicarbonate 26, glucose 124, BUN 21 cr 1,29  BNP 871  High sensitive troponin 9 and 9  Wbc 8,3 hgb 10,8 plt 345  [place] covid 19 negative   Chest radiograph with cardiomegaly, bilateral hilar vascular congestion and cephalization of the vasculature. Bilateral pleural effusions.   EKG 155 bpm, right axis deviation, atrial fibrillation rhythm with poor R R wave progression, no significant ST segment or T wave changes.   [place] lower extremities negative for deep vein thrombosis.   Patient was placed on diltiazem infusion and IV furosemide.    11/29 patient with improvement in her symptoms with better rate controlled atrial fibrillation.  11/30 worsening renal function. She has developed acute metabolic encephalopathy.  12/01 volume status has improved, but she continue with high 02  requirements.   Assessment and Plan: * Acute on chronic diastolic CHF (congestive heart failure) (HCC) Echocardiogram with preserved LV systolic function with EF 55 to 60%, RV with preserved systolic function, RVSP 44,6 mmHg, LA with moderate dilatation, mild to moderate aortic valve regurgitation.   Acute on chronic core pulmonale.  Documented urine output is 1,350 ml  Systolic blood  pressure 120's  mmHg.   Plan to hold on diuretic therapy for today.   Acute hypoxemic respiratory failure due to acute cardiogenic pulmonary edema.  11/29 follow up chest radiograph with persistent signs of pulmonary edema.  Today on HFNC (heated) 30 L/min Fi02 60% with 02 saturation 94%.   Acute metabolic encephalopathy, more awake and reactive today.   PAF (paroxysmal atrial fibrillation) (HCC) Patient with bradycardic events down to the 50's,  metoprolol has been discontinued.  Telemetry today with atrial flutter with variable block in the 70's bpm.   In the past she has been on B blocker, flecainide and amiodarone, all discontinued due to patient converting to sinus rhythm with bradycardia. 04/2022 had direct current cardioversion, follow up 05/2022 patient was back in atrial fibrillation.   Continue rate control with oral diltiazem 120 mg bid.  Anticoagulation with apixaban.  Continue telemetry monitoring.   Chronic kidney disease, stage 3b (HCC) AKI   Worsening renal function due to cardiorenal syndrome.  Her edema has improved today, renal function with serum cr at 2,53 with K at 3,9 and serum bicarbonate at 29  Na 144   Renal US with with increases echogenicity of the bilateral renal cortices, consistent with medical renal disease.  Moderate  left hydronephrosis.   Not able to place foley catheter.   Continue to hold on diuretic therapy.   Essential hypertension, benign Continue blood pressure monitoring.  Continue with diltiazem.   GERD (gastroesophageal reflux  disease) Continue pantoprazole.  Dyslipidemia Continue statin therapy.         Subjective: Patient is more reactive today, she had decreased dyspnea, no chest pain   Physical Exam: Vitals:   11/12/23 0450 11/12/23 0751 11/12/23 1032 11/12/23 1115  BP: 127/62 121/60  (!) 110/52  Pulse: 73 79  70  Resp: (!) 21 (!) 22  18  Temp: (!) 96.9 F (36.1 C) 97.6 F (36.4 C)  (!) 97.3 F (36.3 C)  TempSrc: Axillary Oral  Oral  SpO2: 95% 93% 94% 97%  Weight: 72 kg     Height:       Neurology more awake and alert today ENT mild pallor, continue with heated high flow nasal cannula in place Cardiovascular with S1 and S2 present, irregularly irregular with no gallops, rubs or murmurs Respiratory with rales at bases with no wheezing or rhonchi Abdomen with no distention  Lower extremity edema has improved,  Data Reviewed:    Family Communication: no family at the bedside   Disposition: Status is: Inpatient Remains inpatient appropriate because: heart failure and renal failure   Planned Discharge Destination: Home     Author: Coralie Keens, MD 11/12/2023 2:17 PM  For on call review [email or web address].

## 2023-11-12 NOTE — Plan of Care (Signed)

## 2023-11-12 NOTE — Progress Notes (Signed)
Notified Dr. Ella Jubilee that patient's daughter is expressing concern that patient does not swallow solid foods when fed to her. Able to get patient to swallow ice cream, but patient would not try food from her meal trays.Orders received for SLP consult and Dietician consult.

## 2023-11-12 NOTE — Progress Notes (Signed)
Notified Dr. Ella Jubilee that patient's daughter expressed concern that patient has not had a bowel movement since prior to admission. Order received for Dulcolax.

## 2023-11-12 NOTE — Plan of Care (Signed)

## 2023-11-12 NOTE — Progress Notes (Signed)
Pt weaned from [facility] to salter 13L and is tolerating well at this time.

## 2023-11-12 NOTE — Progress Notes (Signed)
Cardiology Progress Note  Patient ID: Christina Morgan MRN: [id number] DOB: August 20, 1933 Date of Encounter: 11/12/2023 Primary Cardiologist: Armanda Magic, MD  Subjective   Chief Complaint: SOB  HPI: Serum creatinine continues to rise.  Volume status is improving.  A-fib better controlled  ROS:  All other ROS reviewed and negative. Pertinent positives noted in the HPI.     Telemetry  Overnight telemetry shows A-fib 70s, which I personally reviewed.    Physical Exam   Vitals:   11/12/23 0254 11/12/23 0336 11/12/23 0450 11/12/23 0751  BP: 125/65  127/62 121/60  Pulse: 74 79 73 79  Resp: (!) 21 (!) 27 (!) 21 (!) 22  Temp: (!) 96.9 F (36.1 C)  (!) 96.9 F (36.1 C) 97.6 F (36.4 C)  TempSrc: Axillary  Axillary Oral  SpO2: 97%  95% 93%  Weight:   72 kg   Height:        Intake/Output Summary (Last 24 hours) at 11/12/2023 0821 Last data filed at 11/12/2023 0450 Gross per 24 hour  Intake 53 ml  Output 1350 ml  Net -1297 ml       11/12/2023    4:50 AM 11/11/2023    5:44 AM 11/10/2023    4:28 AM  Last 3 Weights  Weight (lbs) 158 lb 11.7 oz 169 lb 8.5 oz 171 lb 11.8 oz  Weight (kg) 72 kg 76.9 kg 77.9 kg    Body mass index is 27.25 kg/m.  General: Ill-appearing, confused Head: Atraumatic, normal size  Eyes: PEERLA, EOMI  Neck: Supple, JVD 7 to 8 cm of water Endocrine: No thryomegaly Cardiac: Normal S1, S2; irregular rhythm, no murmurs rubs or gallops Lungs: Crackles at the lung bases Abd: Soft, nontender, no hepatomegaly  Ext: No edema, pulses 2+ Musculoskeletal: No deformities, BUE and BLE strength normal and equal Skin: Warm and dry, no rashes   Neuro: Drowsy, awake, will follow commands  Cardiac Studies  TTE 11/09/2023  1. Left ventricular ejection fraction, by estimation, is 55 to 60%. The  left ventricle has normal function. The left ventricle has no regional  wall motion abnormalities. Indeterminate diastolic filling due to E-A  fusion.   2. Right  ventricular systolic function is normal. The right ventricular  size is normal. There is mildly elevated pulmonary artery systolic  pressure. The estimated right ventricular systolic pressure is 44.6 mmHg.   3. Left atrial size was moderately dilated.   4. The mitral valve is abnormal. Mild mitral valve regurgitation. No  evidence of mitral stenosis. Severe mitral annular calcification.   5. The aortic valve has an indeterminant number of cusps. Aortic valve  regurgitation is mild to moderate. No aortic stenosis is present.   6. The inferior vena cava is dilated in size with <50% respiratory  variability, suggesting right atrial pressure of 15 mmHg.   Patient Profile  Christina Morgan is a 89 y.o. female with HFpEF, paroxysmal atrial fibrillation, hypertension, hyperlipidemia admitted on 11/08/2023 for acute on chronic HFpEF complicated by A-fib with RVR.   Assessment & Plan   # Acute on chronic diastolic heart failure, EF 55 to 60% # Pulmonary hypertension -We are holding Lasix due to AKI.  She continues to have good urine output.  Will continue to hold Lasix for now.  Renal ultrasound shows moderate left hydronephrosis and medical renal disease. -She still appears volume up.  Would recommend to resume diuresis once her kidney function improves. -Ongoing discussions with family regarding goals of care.  Currently DNR  with limited scope.  # Acute hypoxic respiratory failure -Remains on high flow nasal cannula.  Saturations are stable. -Holding diuresis until kidney function improves.  # AKI -Suspect cardiorenal and secondary to diuresis.  Holding diuretics today. -Renal ultrasound with medical renal disease and moderate left hydronephrosis.  # Persistent atrial fibrillation -Rates are well-controlled on diltiazem 120 mg twice daily.  She is on Eliquis 2.5 mg twice daily. -Would recommend rate control strategy for now.  # Goals of care -Ongoing discussion with family.  She may  transition to hospice care.      For questions or updates, please contact [facility] Please consult [email or web address] for contact info under     Signed, Gerri Spore T. Flora Lipps, MD, [facility] [place]  [facility]  11/12/2023 8:21 AM

## 2023-11-13 ENCOUNTER — Inpatient Hospital Stay (HOSPITAL_COMMUNITY): Payer: Medicare Other

## 2023-11-13 DIAGNOSIS — I480 Paroxysmal atrial fibrillation: Secondary | ICD-10-CM | POA: Diagnosis not present

## 2023-11-13 DIAGNOSIS — I5033 Acute on chronic diastolic (congestive) heart failure: Secondary | ICD-10-CM | POA: Diagnosis not present

## 2023-11-13 DIAGNOSIS — I10 Essential (primary) hypertension: Secondary | ICD-10-CM | POA: Diagnosis not present

## 2023-11-13 DIAGNOSIS — N1832 Chronic kidney disease, stage 3b: Secondary | ICD-10-CM | POA: Diagnosis not present

## 2023-11-13 LAB — RENAL FUNCTION PANEL
Albumin: 3.1 g/dL — ABNORMAL LOW (ref 3.5–5.0)
Anion gap: 10 (ref 5–15)
BUN: 49 mg/dL — ABNORMAL HIGH (ref 8–23)
CO2: 31 mmol/L (ref 22–32)
Calcium: 8.8 mg/dL — ABNORMAL LOW (ref 8.9–10.3)
Chloride: 104 mmol/L (ref 98–111)
Creatinine, Ser: 2.23 mg/dL — ABNORMAL HIGH (ref 0.44–1.00)
GFR, Estimated: 21 mL/min — ABNORMAL LOW (ref >60)
Glucose, Bld: 100 mg/dL — ABNORMAL HIGH (ref 70–99)
Phosphorus: 5.7 mg/dL — ABNORMAL HIGH (ref 2.5–4.6)
Potassium: 3.7 mmol/L (ref 3.5–5.1)
Sodium: 145 mmol/L (ref 135–145)

## 2023-11-13 MED ORDER — ENSURE ENLIVE PO LIQD
237.0000 mL | Freq: Three times a day (TID) | ORAL | Status: DC
  Administered 2023-11-13 – 2023-11-20 (×13): 237 mL via ORAL

## 2023-11-13 MED ORDER — TORSEMIDE 20 MG PO TABS
40.0000 mg | ORAL_TABLET | Freq: Every day | ORAL | Status: DC
  Administered 2023-11-13 – 2023-11-14 (×2): 40 mg via ORAL
  Filled 2023-11-13 (×3): qty 2

## 2023-11-13 NOTE — Progress Notes (Signed)
TRH night cross cover note:   I was notified by RN that the patient's persistent hypothermia (95.3 rectal, 96.5 F axillary), refractory to application of warm blankets.  I subsequently placed an order for [employer]      Newton Pigg, DO Hospitalist

## 2023-11-13 NOTE — Progress Notes (Signed)
Cardiology Progress Note  Patient ID: Christina Morgan MRN: [id number] DOB: 10-22-1933 Date of Encounter: 11/13/2023 Primary Cardiologist: Armanda Magic, MD  Subjective   Difficulty swallowing   ROS:  All other ROS reviewed and negative. Pertinent positives noted in the HPI.     Telemetry   Afib rate controlled 70's    Physical Exam   Vitals:   11/13/23 0400 11/13/23 0434 11/13/23 0630 11/13/23 0731  BP: 121/70   136/67  Pulse:    82  Resp:  20  (!) 21  Temp: (!) 97 F (36.1 C) (!) 95.3 F (35.2 C) 97.6 F (36.4 C) (!) 97.4 F (36.3 C)  TempSrc: Axillary Rectal Axillary Oral  SpO2:    99%  Weight:      Height:        Intake/Output Summary (Last 24 hours) at 11/13/2023 0752 Last data filed at 11/13/2023 0630 Gross per 24 hour  Intake 230 ml  Output 1025 ml  Net -795 ml       11/13/2023    3:37 AM 11/12/2023    4:50 AM 11/11/2023    5:44 AM  Last 3 Weights  Weight (lbs) 158 lb 8.2 oz 158 lb 11.7 oz 169 lb 8.5 oz  Weight (kg) 71.9 kg 72 kg 76.9 kg    Body mass index is 27.21 kg/m.  General: Ill-appearing,  Head: Atraumatic, normal size  Eyes: PEERLA, EOMI  Neck: Supple, JVD 7 to 8 cm of water Endocrine: No thryomegaly Cardiac: Normal S1, S2; irregular rhythm, no murmurs rubs or gallops Lungs: Crackles at the lung bases Abd: Soft, nontender, no hepatomegaly  Ext: No edema, pulses 2+ Musculoskeletal: No deformities, BUE and BLE strength normal and equal Skin: Warm and dry, no rashes   Neuro: Drowsy, awake, will follow commands  Cardiac Studies  TTE 11/09/2023  1. Left ventricular ejection fraction, by estimation, is 55 to 60%. The  left ventricle has normal function. The left ventricle has no regional  wall motion abnormalities. Indeterminate diastolic filling due to E-A  fusion.   2. Right ventricular systolic function is normal. The right ventricular  size is normal. There is mildly elevated pulmonary artery systolic  pressure. The estimated right  ventricular systolic pressure is 44.6 mmHg.   3. Left atrial size was moderately dilated.   4. The mitral valve is abnormal. Mild mitral valve regurgitation. No  evidence of mitral stenosis. Severe mitral annular calcification.   5. The aortic valve has an indeterminant number of cusps. Aortic valve  regurgitation is mild to moderate. No aortic stenosis is present.   6. The inferior vena cava is dilated in size with <50% respiratory  variability, suggesting right atrial pressure of 15 mmHg.   Patient Profile  Christina Morgan is a 89 y.o. female with HFpEF, paroxysmal atrial fibrillation, hypertension, hyperlipidemia admitted on 11/08/2023 for acute on chronic HFpEF complicated by A-fib with RVR.   Assessment & Plan   # Acute on chronic diastolic heart failure, EF 55 to 60% # Pulmonary hypertension -We are holding Lasix due to AKI.  She continues to have good urine output.  Will continue to hold Lasix for now.  Renal ultrasound shows moderate left hydronephrosis and medical renal disease. -volume better less LE edema  -Ongoing discussions with family regarding goals of care.  Currently DNR with limited scope.  # Acute hypoxic respiratory failure -Remains on high flow nasal cannula.  Saturations are stable. -Holding diuresis until kidney function improves.  # AKI -Suspect cardiorenal  and secondary to diuresis.  Holding diuretics today. -Renal ultrasound with medical renal disease and moderate left hydronephrosis. - Cr better 2.53-> 2.23 continue to hold diuretic   # Persistent atrial fibrillation -Rates are well-controlled on diltiazem 120 mg twice daily.  She is on Eliquis 2.5 mg twice daily. -Would recommend rate control strategy for now.  # Goals of care -Ongoing discussion with family.  She may transition to hospice care.    Cardiology will sign off at this point Will arrange outpatient f/u with Dr Mayford Knife  For questions or updates, please contact [facility] Please consult [email or web address] for contact info under     Charlton Haws MD [facility]

## 2023-11-13 NOTE — Evaluation (Signed)
Clinical/Bedside Swallow Evaluation Patient Details  Name: Christina Morgan MRN: [id number] Date of Birth: 1933/07/10  Today's Date: 11/13/2023 Time: SLP Start Time (ACUTE ONLY): 1037 SLP Stop Time (ACUTE ONLY): 1050 SLP Time Calculation (min) (ACUTE ONLY): 13 min  Past Medical History:  Past Medical History:  Diagnosis Date   Cancer (HCC)    endometrial ca   Chronic diastolic CHF (congestive heart failure) (HCC)    Chronic edema    CKD (chronic kidney disease), stage III (HCC)    Dyslipidemia    Fracture of left humerus    History of cardiovascular stress test    Lexiscan Myoview (06/2014): No ischemia or scar, not gated, low risk   Hx of echocardiogram    Echo (05/02/14): EF 60% to 65%. No regional wall motion abnormalities. Mild AI, mildly dilated aortic root (37 mm), trivial MR, trivial TR   Hyperlipidemia    Hypertension    Lipoma of skin    multiple   Osteopenia    PAF (paroxysmal atrial fibrillation) (HCC)    failed DCCV 05/2014 >> Flecainide started >> DCCV 7/15 sucessful;  f/u ETT neg for pro-arrhythmia >> recurrent AF/AFL >> Flecainide inc to 100 bid with repeat DCCV 9/15   Vitamin D deficiency 09/25/2019   Past Surgical History:  Past Surgical History:  Procedure Laterality Date   CARDIOVERSION N/A 06/05/2014   Procedure: CARDIOVERSION;  Surgeon: Quintella Reichert, MD;  Location: [facility] ENDOSCOPY;  Service: Cardiovascular;  Laterality: N/A;   CARDIOVERSION N/A 07/11/2014   Procedure: CARDIOVERSION;  Surgeon: Quintella Reichert, MD;  Location: [facility] ENDOSCOPY;  Service: Cardiovascular;  Laterality: N/A;   CARDIOVERSION N/A 09/04/2014   Procedure: CARDIOVERSION;  Surgeon: Quintella Reichert, MD;  Location: [facility] ENDOSCOPY;  Service: Cardiovascular;  Laterality: N/A;   CARDIOVERSION N/A 05/04/2022   Procedure: CARDIOVERSION;  Surgeon: Little Ishikawa, MD;  Location: [facility] ENDOSCOPY;  Service: Cardiovascular;  Laterality: N/A;   COLONOSCOPY  2008   COLONOSCOPY N/A 05/20/2015   Procedure:  COLONOSCOPY;  Surgeon: Bernette Redbird, MD;  Location: [facility] ENDOSCOPY;  Service: Endoscopy;  Laterality: N/A;   FEMUR IM NAIL Right 09/24/2019   Procedure: INTRAMEDULLARY (IM) NAIL FEMORAL;  Surgeon: Myrene Galas, MD;  Location: [facility] OR;  Service: Orthopedics;  Laterality: Right;   IR KYPHO EA ADDL LEVEL THORACIC OR LUMBAR  04/12/2019   IR KYPHO THORACIC WITH BONE BIOPSY  04/12/2019   ROBOTIC ASSISTED SUPRACERVICAL HYSTERECTOMY WITH BILATERAL SALPINGO OOPHERECTOMY  11/06/2012   and bilateral pelvic lymph node dissection   HPI:  Christina Morgan was admitted to the hospital with the working diagnosis of atrial fibrillation with RVR in the setting of heart failure decompensation. Pt encephalopathic as of 12/1 though volume status improved.    Assessment / Plan / Recommendation  Clinical Impression  SLP referral made due to daughter reporting pt refusing to eat most solids on tray. RN also confirms minimal intake, easier to encourage ensure and applesauce. Pt is getting assist with food trays. Pt today demonstrates lethargy, shortness of breath, disinterest in oral intake. She does confirm she has early satiety. However, when observed, pt has no overt impairment swallowing. SHe can masticate and swallow solids and liquids, but refused more than one bite. This is very common during significant illness. Would not advise altering food texture to puree as this is not very palatable and only necessary when pt has a dysphagia diagnosis. If concerns worsen or persist, esophageal dysphagia could also be a reason for refusal, but pt participation in testing  is likely unrealistic at this time. Best to offer liquid supplements, offer favorite foods and work on improving mental status and activity if possible. Pt already has assist with meals. Nothing more for SLP to offer at this time. SLP Visit Diagnosis: Dysphagia, unspecified (R13.10)    Aspiration Risk  Risk for inadequate nutrition/hydration    Diet Recommendation  Regular;Thin liquid    Liquid Administration via: Cup;Straw Medication Administration: Crushed with puree Supervision: Full supervision/cueing for compensatory strategies Compensations: Minimize environmental distractions Postural Changes: Seated upright at 90 degrees;Remain upright for at least 30 minutes after po intake    Other  Recommendations      Recommendations for follow up therapy are one component of a multi-disciplinary discharge planning process, led by the attending physician.  Recommendations may be updated based on patient status, additional functional criteria and insurance authorization.  Follow up Recommendations Skilled nursing-short term rehab (<3 hours/day)      Assistance Recommended at Discharge    Functional Status Assessment Patient has had a recent decline in their functional status and demonstrates the ability to make significant improvements in function in a reasonable and predictable amount of time.  Frequency and Duration            Prognosis        Swallow Study   General HPI: Christina Morgan was admitted to the hospital with the working diagnosis of atrial fibrillation with RVR in the setting of heart failure decompensation. Pt encephalopathic as of 12/1 though volume status improved. Type of Study: Bedside Swallow Evaluation Respiratory Status: Nasal cannula History of Recent Intubation: No Behavior/Cognition: Lethargic/Drowsy;Requires cueing Oral Cavity Assessment: Within Functional Limits Oral Cavity - Dentition: Adequate natural dentition Patient Positioning: Upright in bed Baseline Vocal Quality: Normal Volitional Cough: Weak Volitional Swallow: Able to elicit    Oral/Motor/Sensory Function Overall Oral Motor/Sensory Function: Within functional limits   Ice Chips Ice chips: Not tested   Thin Liquid Thin Liquid: Within functional limits Presentation: Straw    Nectar Thick Nectar Thick Liquid: Not tested   Honey Thick Honey Thick Liquid:  Not tested   Puree Puree: Within functional limits Presentation: Spoon   Solid     Solid: Within functional limits Presentation: Self Fed      Christina Morgan, Christina Morgan 11/13/2023,11:13 AM

## 2023-11-13 NOTE — Progress Notes (Addendum)
Initial Nutrition Assessment  DOCUMENTATION CODES:   Non-severe (moderate) malnutrition in context of chronic illness  INTERVENTION:   Liberalize diet to regular.  Continue Ensure Enlive, increase to TID, each supplement provides 350 kcal and 20 grams of protein. Magic cup TID with meals, each supplement provides 290 kcal and 9 grams of protein. Mighty Shake TID with meals, each supplement provides 330 kcals and 9 grams of protein.  NUTRITION DIAGNOSIS:   Moderate Malnutrition related to chronic illness (CHF) as evidenced by mild muscle depletion, mild fat depletion.  GOAL:   Patient will meet greater than or equal to 90% of their needs  MONITOR:   PO intake, Supplement acceptance  REASON FOR ASSESSMENT:   Consult Assessment of nutrition requirement/status  ASSESSMENT:   89 yo female admitted with decompensated HF with worsening cardiorenal syndrome. PMH includes HTN, HLD, PAF, CHF, CKD, endometrial cancer, osteopenia, chronic edema, vitamin D deficiency.  Patient unable to provide any nutrition hx during RD visit.   Goals of care discussions are ongoing with family.  S/P bedside swallow evaluation with SLP today; SLP recommends  continuing regular consistency solids and thin liquids. Per SLP note, patient has been refusing to eat most solids on tray and RN has been encouraging intake of Ensure and applesauce.   Labs reviewed. BUN 49, creat 2.23, phos 5.7, mag 2.6  Medications reviewed and include MVI with minerals.  UOP 1,025 ml (+1 unmeasured occurrence) x 24 hours.  Weight hx reviewed. Weight has decreased since admission with diuresis. PTA, weight was down by 6.5% in the past 2 months. Unable to determine actual dry weight loss, given hx CHF and chronic edema.  Patient meets criteria for moderate malnutrition, given mild-moderate depletion of muscle and subcutaneous fat mass.  NUTRITION - FOCUSED PHYSICAL EXAM:  Flowsheet Row Most Recent Value  Orbital Region  Mild depletion  Upper Arm Region No depletion  Thoracic and Lumbar Region No depletion  Buccal Region Mild depletion  Temple Region Mild depletion  Clavicle Bone Region Mild depletion  Clavicle and Acromion Bone Region Mild depletion  Scapular Bone Region Unable to assess  Dorsal Hand Mild depletion  Patellar Region No depletion  Anterior Thigh Region No depletion  Posterior Calf Region Moderate depletion  Edema (RD Assessment) Mild  Hair Reviewed  Eyes Unable to assess  Mouth Unable to assess  Skin Reviewed  Nails Reviewed       Diet Order:   Diet Order             Diet Heart Room service appropriate? Yes; Fluid consistency: Thin  Diet effective now                   EDUCATION NEEDS:   No education needs have been identified at this time  Skin:  Skin Assessment: Reviewed RN Assessment  Last BM:  no BM documented  Height:   Ht Readings from Last 1 Encounters:  11/08/23 5\' 4"  (1.626 m)    Weight:   Wt Readings from Last 1 Encounters:  11/13/23 71.9 kg    Ideal Body Weight:  54.5 kg  BMI:  Body mass index is 27.21 kg/m.  Estimated Nutritional Needs:   Kcal:  1500-1700  Protein:  75-85 gm  Fluid:  1.5 L   Gabriel Rainwater RD, LDN, CNSC Please refer to Amion for contact information.

## 2023-11-13 NOTE — Plan of Care (Signed)
  Problem: Education: Goal: Knowledge of General Education information will improve Description: Including pain rating scale, medication(s)/side effects and non-pharmacologic comfort measures Outcome: Not Progressing   Problem: Health Behavior/Discharge Planning: Goal: Ability to manage health-related needs will improve Outcome: Not Progressing   

## 2023-11-13 NOTE — Progress Notes (Signed)
Progress Note   Patient: Christina Morgan ZOX:[id number] DOB: 04-Sep-1933 DOA: 11/08/2023     5 DOS: the patient was seen and examined on 11/13/2023   Brief hospital course: Mrs. Ravert was admitted to the hospital with the working diagnosis of atrial fibrillation with RVR in the setting of heart failure decompensation.   89 yo female with the past medical history of hypertension, hyperlipidemia, paroxysmal atrial fibrillation, and CKD who presented with worsening lower extremity edema. Worsening symptoms for 7 days, which progressed to dyspnea over the last 24 hrs prior to admission. Her diuretic dose was increased at SNF with no significant improvement in her symptoms. She was seen at [facility] ED, found volume overloaded and in atrial fibrillation with RVR, transferred to [facility] for further management.  His heart rate was 169 bpm, RR 18 to 29, blood pressure 136/97 and 02 saturation 97%, lungs with rales bilaterally with no wheezing or rhonchi, positive JVD, heart with S1 and S2 present, irregularly irregular with no gallops, abdomen not distended and positive lower extremity edema.   Na 144, K 3,6 Cl 104, bicarbonate 26, glucose 124, BUN 21 cr 1,29  BNP 871  High sensitive troponin 9 and 9  Wbc 8,3 hgb 10,8 plt 345  Sars covid 19 negative   Chest radiograph with cardiomegaly, bilateral hilar vascular congestion and cephalization of the vasculature. Bilateral pleural effusions.   EKG 155 bpm, right axis deviation, atrial fibrillation rhythm with poor R R wave progression, no significant ST segment or T wave changes.   [place] lower extremities negative for deep vein thrombosis.   Patient was placed on diltiazem infusion and IV furosemide.    11/29 patient with improvement in her symptoms with better rate controlled atrial fibrillation.  11/30 worsening renal function. She has developed acute metabolic encephalopathy.  12/01 volume status has improved, but she continue with high 02  requirements.  12/02 patient on regular high flow nasal cannula, continue very weak and deconditioned.   Assessment and Plan: * Acute on chronic diastolic CHF (congestive heart failure) (HCC) Echocardiogram with preserved LV systolic function with EF 55 to 60%, RV with preserved systolic function, RVSP 44,6 mmHg, LA with moderate dilatation, mild to moderate aortic valve regurgitation.   Acute on chronic core pulmonale.  Documented urine output is 1,025 ml  Systolic blood  pressure 120 to 130  mmHg.   Patient had metolazone on 11/29  Will transition to oral torsemide 40 mg daily.   Acute hypoxemic respiratory failure due to acute cardiogenic pulmonary edema.   11/29 follow up chest radiograph with persistent signs of pulmonary edema.  12/02 follow up chest radiograph with improvement in pulmonary edema but not completely resolved. Positive bilateral pleural effusions.   Her 02 requirements are down to regular high flow nasal cannular 12 to 15 L/min   Acute metabolic encephalopathy, more reactive but continue very weak and deconditioned.   PAF (paroxysmal atrial fibrillation) (HCC) In the past she has been on B blocker, flecainide and amiodarone, all discontinued due to patient converting to sinus rhythm with bradycardia. 04/2022 had direct current cardioversion, follow up 05/2022 patient was back in atrial fibrillation.   Telemetry with atrial fibrillation in the 80 bpm range.   Continue rate control with oral diltiazem 120 mg bid.  Anticoagulation with apixaban.  Continue telemetry monitoring.   Chronic kidney disease, stage 3b (HCC) AKI, cardio renal syndrome.   Today her renal function, her serum 2.23, BUN 49, K 3.7,  P 5.7  Renal US with with increases echogenicity of the bilateral renal cortices, consistent with medical renal disease.  Moderate left hydronephrosis.   Not able to place foley catheter.   Resume diuresis with oral loop diuretic with torsemide.   Follow up renal function in am.   Essential hypertension, benign Continue blood pressure monitoring.  Continue with diltiazem.   GERD (gastroesophageal reflux disease) Continue pantoprazole.  Dyslipidemia Continue statin therapy.         Subjective: Patient somnolent and very deconditioned, her children are at the bedside   Physical Exam: Vitals:   11/13/23 0857 11/13/23 0951 11/13/23 1115 11/13/23 1205  BP:  (!) 145/67 (!) 126/53   Pulse:   75   Resp:   16   Temp: 97.7 F (36.5 C)  97.6 F (36.4 C) (!) 97.4 F (36.3 C)  TempSrc:   Oral Oral  SpO2:   92%   Weight:      Height:       Neurology somnolent, responds to voice and touch ENT with mild pallor Cardiovascular with S1 and S2 present, irregularly irregular with no gallops, rubs or murmurs. Mild to moderate JVD  Respiratory with rales at bases with decreased breath sounds at bases with no wheezing  Abdomen with no distention  Trace lower extremity edema  Data Reviewed:    Family Communication: I spoke with patient's son and daughter at the bedside, we talked in detail about patient's condition, plan of care and prognosis and all questions were addressed.   Disposition: Status is: Inpatient Remains inpatient appropriate because: cardiorenal syndrome and heart failure, may need SNF   Planned Discharge Destination: Skilled nursing facility     Author: Coralie Keens, MD 11/13/2023 2:27 PM  For on call review [email or web address].

## 2023-11-13 NOTE — Evaluation (Signed)
Occupational Therapy Evaluation Patient Details Name: Christina Morgan MRN: [id number] DOB: 07/02/1933 Today's Date: 11/13/2023   History of Present Illness Patient is an 89 y.o. female who presents to [facility] hospital on 11/08/2023 for acute on chronic diastolic HF. PMH includes CKD, HLD, HTN, PAF, and endometrial cancer.   Clinical Impression   PTA, pt lived at ALF where staff assisted with ADL as needed. Upon eval, pt with decreased activity tolerance, cardiopulmonary endurance, strength, and balance. Pt on 10L supplemental O2 via [place] and maintaining SpO2 >96%. Pt needing up to mod A for bed mobility and min A for basic transfers. Able to SPT to her wheelchair at baseline. Pt will continue to benefit from skilled acute OT and inpatient rehab <3 hours/day at discharge.        If plan is discharge home, recommend the following: A little help with walking and/or transfers;A little help with bathing/dressing/bathroom;A lot of help with bathing/dressing/bathroom;Assistance with cooking/housework;Assist for transportation;Help with stairs or ramp for entrance    Functional Status Assessment  Patient has had a recent decline in their functional status and demonstrates the ability to make significant improvements in function in a reasonable and predictable amount of time.  Equipment Recommendations  Other (comment) (defer)    Recommendations for Other Services       Precautions / Restrictions Precautions Precautions: Fall Restrictions Weight Bearing Restrictions: No      Mobility Bed Mobility Overal bed mobility: Needs Assistance Bed Mobility: Supine to Sit, Sit to Supine     Supine to sit: HOB elevated, Used rails, Min assist Sit to supine: Mod assist, HOB elevated (Required assistance with trunk and BLE control)        Transfers Overall transfer level: Needs assistance Equipment used: Rolling walker (2 wheels) Transfers: Sit to/from Stand Sit to Stand: Min assist, Contact  guard assist           General transfer comment: bed slightly elevated as pt reports this is what she does at home.      Balance Overall balance assessment: Needs assistance Sitting-balance support: Feet supported, Single extremity supported Sitting balance-Leahy Scale: Fair     Standing balance support: Bilateral upper extremity supported, During functional activity, Reliant on assistive device for balance Standing balance-Leahy Scale: Poor                             ADL either performed or assessed with clinical judgement   ADL Overall ADL's : Needs assistance/impaired Eating/Feeding: Set up;Sitting   Grooming: Set up;Sitting;Oral care;Wash/dry face   Upper Body Bathing: Minimal assistance;Sitting   Lower Body Bathing: Maximal assistance;Sit to/from stand   Upper Body Dressing : Minimal assistance;Sitting   Lower Body Dressing: Maximal assistance;Sit to/from stand   Toilet Transfer: Minimal assistance;Stand-pivot;Rolling walker (2 wheels) Toilet Transfer Details (indicate cue type and reason): steadying asisst with steps         Functional mobility during ADLs: Minimal assistance;Rolling walker (2 wheels) (very short distances)       Vision Baseline Vision/History: 1 Wears glasses Ability to See in Adequate Light: 0 Adequate Patient Visual Report: No change from baseline Vision Assessment?: No apparent visual deficits     Perception         Praxis         Pertinent Vitals/Pain Pain Assessment Pain Assessment: No/denies pain     Extremity/Trunk Assessment Upper Extremity Assessment Upper Extremity Assessment: Generalized weakness (pt reports LUE was fractured several  years ago and now she has assist with UB ADL, but range is functional (not WNL however))   Lower Extremity Assessment Lower Extremity Assessment: Generalized weakness   Cervical / Trunk Assessment Cervical / Trunk Assessment: Kyphotic   Communication  Communication Communication: No apparent difficulties Cueing Techniques: Verbal cues   Cognition Arousal: Alert Behavior During Therapy: WFL for tasks assessed/performed Overall Cognitive Status: Within Functional Limits for tasks assessed                                 General Comments: able to answer most questions regarding PLOF and following commands WFL. some increased time for processing and likely mild decr STM at baseline. Was able to report events of prior PT session     General Comments  HR in 90s throughout. 124 just after transition back to supine    Exercises     Shoulder Instructions      Home Living Family/patient expects to be discharged to:: Assisted living                             Home Equipment: Rolling Walker (2 wheels);Wheelchair - manual          Prior Functioning/Environment Prior Level of Function : Needs assist       Physical Assist : Mobility (physical);ADLs (physical)     Mobility Comments: Pt used WC for mobility. Pt is able to stand pivot from bed to [facility] with the use of a RW. ADLs Comments: Pt requires assistances with meds and dressing. Pt able to feed self.        OT Problem List: Decreased strength;Decreased activity tolerance;Impaired balance (sitting and/or standing);Cardiopulmonary status limiting activity;Decreased safety awareness;Decreased knowledge of use of DME or AE      OT Treatment/Interventions: Self-care/ADL training;Therapeutic exercise;DME and/or AE instruction;Balance training;Patient/family education;Therapeutic activities    OT Goals(Current goals can be found in the care plan section) Acute Rehab OT Goals Patient Stated Goal: go home OT Goal Formulation: With patient Time For Goal Achievement: 11/27/23 Potential to Achieve Goals: Good  OT Frequency: Min 1X/week    Co-evaluation              AM-PAC OT "6 Clicks" Daily Activity     Outcome Measure Help from another person  eating meals?: None Help from another person taking care of personal grooming?: A Little Help from another person toileting, which includes using toliet, bedpan, or urinal?: A Lot Help from another person bathing (including washing, rinsing, drying)?: A Lot Help from another person to put on and taking off regular upper body clothing?: A Little Help from another person to put on and taking off regular lower body clothing?: A Lot 6 Click Score: 16   End of Session Equipment Utilized During Treatment: Gait belt;Rolling walker (2 wheels) Nurse Communication: Mobility status  Activity Tolerance: Patient tolerated treatment well Patient left: in bed;with call bell/phone within reach;with bed alarm set  OT Visit Diagnosis: Unsteadiness on feet (R26.81);Muscle weakness (generalized) (M62.81)                Time: 1914-7829 OT Time Calculation (min): 28 min Charges:  OT General Charges $OT Visit: 1 Visit OT Evaluation $OT Eval Moderate Complexity: 1 Mod OT Treatments $Self Care/Home Management : 8-22 mins  Tyler Deis, OTR/L [facility] Acute Rehabilitation Office: [phone]   Myrla Halsted 11/13/2023, 5:10 PM

## 2023-11-13 NOTE — Care Management Important Message (Signed)
Important Message  Patient Details  Name: Christina Morgan MRN: [id number] Date of Birth: 01-Mar-1933   Important Message Given:  Yes - Medicare IM     Dorena Bodo 11/13/2023, 3:36 PM

## 2023-11-13 NOTE — Evaluation (Signed)
Physical Therapy Evaluation Patient Details Name: Christina Morgan MRN: [id number] DOB: [birth year] Today's Date: 11/13/2023  History of Present Illness  Patient is an 89 y.o. female who presents to [facility] hospital on 11/08/2023 for acute on chronic diastolic HF. PMH includes CKD, HLD, HTN, PAF, and endometrial cancer.  Clinical Impression  Pt received in supine with 10L [place] and agreeable to PT session. The pt presents to PT with deficits in strength, mobility, and activity tolerance. The pt was able to tolerate bed mobility and transfer training with minimum exacerbation of symptoms. The Pt required minA-CGA for physical assistance to initiate standing. The pt required modA for sit to supine to assist with trunk and BLE control. Pt O2 sats remainined stable with 10L [place]. Pt recommends short term physical services following hospitalization to address problem list below and decrease caregiver burden. The pt will continue to benefit from skilled PT to address remaining functional deficits.      If plan is discharge home, recommend the following: A lot of help with walking and/or transfers;A little help with bathing/dressing/bathroom;Assistance with cooking/housework;Assist for transportation;Help with stairs or ramp for entrance   Can travel by private vehicle   No    Equipment Recommendations BSC/3in1  Recommendations for Other Services       Functional Status Assessment Patient has had a recent decline in their functional status and demonstrates the ability to make significant improvements in function in a reasonable and predictable amount of time.     Precautions / Restrictions Precautions Precautions: Fall Restrictions Weight Bearing Restrictions: No      Mobility  Bed Mobility Overal bed mobility: Needs Assistance Bed Mobility: Supine to Sit, Sit to Supine     Supine to sit: Contact guard, HOB elevated, Used rails Sit to supine: Mod assist, HOB elevated (Required assistance with  trunk and BLE control)        Transfers Overall transfer level: Needs assistance Equipment used: Rolling walker (2 wheels) Transfers: Sit to/from Stand Sit to Stand: Min assist, Contact guard assist           General transfer comment: Pt required MinA to initiate standing the first attempt. Second attempt the pt needed CGA only    Ambulation/Gait                  Stairs            Wheelchair Mobility     Tilt Bed    Modified Rankin (Stroke Patients Only)       Balance Overall balance assessment: Needs assistance Sitting-balance support: Feet supported, Single extremity supported Sitting balance-Leahy Scale: Fair     Standing balance support: Bilateral upper extremity supported, During functional activity, Reliant on assistive device for balance Standing balance-Leahy Scale: Poor                               Pertinent Vitals/Pain Pain Assessment Pain Assessment: No/denies pain    Home Living Family/patient expects to be discharged to:: Assisted living                 Home Equipment: Agricultural consultant (2 wheels);Wheelchair - manual      Prior Function Prior Level of Function : Needs assist       Physical Assist : Mobility (physical);ADLs (physical)     Mobility Comments: Pt used WC for mobility. Pt is able to stand pivot from bed to [facility] with the use of a  RW. ADLs Comments: Pt requires assistances with meds and dressing. Pt able to feed self.     Extremity/Trunk Assessment   Upper Extremity Assessment Upper Extremity Assessment: Generalized weakness    Lower Extremity Assessment Lower Extremity Assessment: Generalized weakness    Cervical / Trunk Assessment Cervical / Trunk Assessment: Kyphotic  Communication   Communication Communication: No apparent difficulties Cueing Techniques: Verbal cues;Visual cues;Tactile cues  Cognition Arousal: Alert Behavior During Therapy: WFL for tasks assessed/performed Overall  Cognitive Status: Within Functional Limits for tasks assessed                                          General Comments General comments (skin integrity, edema, etc.): Pt HR increased to 150 once transfered back to bed, but quickly recoverd back to the 90s. O2 sats remained stable throughout the session with 10L [place].    Exercises General Exercises - Lower Extremity Long Arc Quad: AROM, Both, 10 reps, Seated   Assessment/Plan    PT Assessment Patient needs continued PT services  PT Problem List Decreased strength;Decreased activity tolerance;Decreased balance;Decreased coordination;Decreased mobility       PT Treatment Interventions DME instruction;Gait training;Stair training;Functional mobility training;Therapeutic activities;Therapeutic exercise;Balance training;Neuromuscular re-education    PT Goals (Current goals can be found in the Care Plan section)  Acute Rehab PT Goals Patient Stated Goal: To get better PT Goal Formulation: With patient Time For Goal Achievement: 11/27/23 Potential to Achieve Goals: Good    Frequency Min 1X/week     Co-evaluation               AM-PAC PT "6 Clicks" Mobility  Outcome Measure Help needed turning from your back to your side while in a flat bed without using bedrails?: A Little Help needed moving from lying on your back to sitting on the side of a flat bed without using bedrails?: A Lot Help needed moving to and from a bed to a chair (including a wheelchair)?: A Lot Help needed standing up from a chair using your arms (e.g., wheelchair or bedside chair)?: A Little Help needed to walk in hospital room?: Total Help needed climbing 3-5 steps with a railing? : Total 6 Click Score: 12    End of Session Equipment Utilized During Treatment: Gait belt;Oxygen Activity Tolerance: Patient tolerated treatment well Patient left: in bed;with bed alarm set;with call bell/phone within reach;with nursing/sitter in room;with  family/visitor present Nurse Communication: Mobility status PT Visit Diagnosis: Muscle weakness (generalized) (M62.81);Other abnormalities of gait and mobility (R26.89)    Time: 1610-9604 PT Time Calculation (min) (ACUTE ONLY): 21 min   Charges:   PT Evaluation $PT Eval Moderate Complexity: 1 Mod   PT General Charges $$ ACUTE PT VISIT: 1 Visit         Caryl Comes, SPT Acute Rehabilitation Office Phone [phone]   Caryl Comes 11/13/2023, 4:59 PM

## 2023-11-14 DIAGNOSIS — I5033 Acute on chronic diastolic (congestive) heart failure: Secondary | ICD-10-CM | POA: Diagnosis not present

## 2023-11-14 DIAGNOSIS — E440 Moderate protein-calorie malnutrition: Secondary | ICD-10-CM

## 2023-11-14 DIAGNOSIS — I10 Essential (primary) hypertension: Secondary | ICD-10-CM | POA: Diagnosis not present

## 2023-11-14 DIAGNOSIS — N1832 Chronic kidney disease, stage 3b: Secondary | ICD-10-CM | POA: Diagnosis not present

## 2023-11-14 DIAGNOSIS — I480 Paroxysmal atrial fibrillation: Secondary | ICD-10-CM | POA: Diagnosis not present

## 2023-11-14 LAB — BASIC METABOLIC PANEL
Anion gap: 12 (ref 5–15)
BUN: 44 mg/dL — ABNORMAL HIGH (ref 8–23)
CO2: 36 mmol/L — ABNORMAL HIGH (ref 22–32)
Calcium: 9.2 mg/dL (ref 8.9–10.3)
Chloride: 98 mmol/L (ref 98–111)
Creatinine, Ser: 1.97 mg/dL — ABNORMAL HIGH (ref 0.44–1.00)
GFR, Estimated: 24 mL/min — ABNORMAL LOW (ref >60)
Glucose, Bld: 103 mg/dL — ABNORMAL HIGH (ref 70–99)
Potassium: 3.4 mmol/L — ABNORMAL LOW (ref 3.5–5.1)
Sodium: 146 mmol/L — ABNORMAL HIGH (ref 135–145)

## 2023-11-14 MED ORDER — POTASSIUM CHLORIDE CRYS ER 20 MEQ PO TBCR
40.0000 meq | EXTENDED_RELEASE_TABLET | Freq: Once | ORAL | Status: AC
  Administered 2023-11-14: 40 meq via ORAL
  Filled 2023-11-14: qty 2

## 2023-11-14 NOTE — Progress Notes (Signed)
Heart Failure Navigator Progress Note  Assessed for Heart & Vascular TOC clinic readiness.  Patient does not meet criteria due to EF 55-60%, No HF TOC per Dr. Ella Jubilee. .   Navigator will sign off at this time.   Rhae Hammock, BSN, Scientist, clinical (histocompatibility and immunogenetics) Only

## 2023-11-14 NOTE — TOC Progression Note (Addendum)
Transition of Care [facility]) - Progression Note    Patient Details  Name: Christina Morgan MRN: [id number] Date of Birth: March 18, 1933  Transition of Care [facility]) CM/SW Contact  [name], [place] Phone Number: 11/14/2023, 1:13 PM  Clinical Narrative:   CSW met pt and dtr at bedside to discuss PT recs for SNF. Dtr stated her brother would like [place] all to met to discuss SNF/insurance.  2:00PM: CSW met pt and her brother to discuss SNF process. CSW let family know that referrals will be sent and offers will be presented to them.  SNF workup completed, waiting on bed offers.   3:32PM: CSW presented SNF bed offers to pt and her family.       Barriers to Discharge: Continued Medical Work up  Expected Discharge Plan and Services       Living arrangements for the past 2 months: Assisted Living Facility                 DME Arranged: Dan Humphreys, Government social research officer                     Social Determinants of Health (SDOH) Interventions SDOH Screenings   Food Insecurity: No Food Insecurity (11/09/2023)  Housing: Low Risk  (11/09/2023)  Transportation Needs: No Transportation Needs (11/09/2023)  Utilities: Not At Risk (11/09/2023)  Financial Resource Strain: Low Risk  (09/27/2019)  Physical Activity: Inactive (09/27/2019)  Social Connections: Unknown (04/15/2022)   Received from [facility], [facility]  Stress: No Stress Concern Present (09/27/2019)  Tobacco Use: Low Risk  (11/08/2023)    Readmission Risk Interventions    03/11/2022   11:40 AM  Readmission Risk Prevention Plan  Transportation Screening Complete  Medication Review (RN Care Manager) Complete  PCP or Specialist appointment within 3-5 days of discharge Complete  HRI or Home Care Consult Complete  SW Recovery Care/Counseling Consult Complete  Palliative Care Screening Not Applicable  Skilled Nursing Facility Not Applicable

## 2023-11-14 NOTE — NC FL2 (Signed)
[place] MEDICAID FL2 LEVEL OF CARE FORM     IDENTIFICATION  Patient Name: Christina Morgan Birthdate: 11/26/1933 Sex: female Admission Date (Current Location): 11/08/2023  [facility] and [place] Number:  Producer, television/film/video and Address:  [facility], [address] [place], [place] [address]      Provider Number: [id number]  Attending Physician Name and Address:  Coralie Keens,*  Relative Name and Phone Number:       Current Level of Care: Hospital Recommended Level of Care: Skilled Nursing Facility Prior Approval Number:    Date Approved/Denied:   PASRR Number: [id number]  Discharge Plan: SNF    Current Diagnoses: Patient Active Problem List   Diagnosis Date Noted   Malnutrition of moderate degree 11/14/2023   Pressure injury of deep tissue of left heel 03/11/2022   Acute metabolic encephalopathy 03/09/2022   Acute lower UTI 03/09/2022   Thrombocytopenia (HCC) 03/09/2022   Elevated transaminase level 03/09/2022   Dyslipidemia    History of anemia due to chronic kidney disease    GERD (gastroesophageal reflux disease)    Fluid overload 03/06/2022   AKI superimposed on stage 3b chronic kidney disease (HCC) 01/02/2022   Symptomatic bradycardia 11/29/2021   Bacteremia due to Gram-positive bacteria 02/13/2020   Acute encephalopathy 02/11/2020   Cervical spine fracture (HCC) 02/11/2020   CAP (community acquired pneumonia) 02/11/2020   Chronic diastolic congestive heart failure (HCC)    Chronic kidney disease, stage 3b (HCC)    PAF (paroxysmal atrial fibrillation) (HCC)    Closed displaced intertrochanteric fracture of right femur (HCC)    Fall 09/23/2019   SOB (shortness of breath) 01/04/2019   Bilateral lower leg cellulitis 03/30/2018   Acute on chronic diastolic CHF (congestive heart failure) (HCC) 03/30/2018   Edema of extremities 08/19/2014   Essential hypertension, benign 05/15/2014   Mixed hyperlipidemia 05/15/2014    Persistent atrial fibrillation (HCC) 05/15/2014   Endometrial ca (HCC) 12/28/2012    Orientation RESPIRATION BLADDER Height & Weight     Self, Situation, Place  O2 (4L [place]) External catheter, Incontinent Weight: 157 lb 13.6 oz (71.6 kg) Height:  5\' 4"  (162.6 cm)  BEHAVIORAL SYMPTOMS/MOOD NEUROLOGICAL BOWEL NUTRITION STATUS      Continent Diet (See dc summary)  AMBULATORY STATUS COMMUNICATION OF NEEDS Skin   Limited Assist Verbally Normal                       Personal Care Assistance Level of Assistance  Bathing, Dressing, Feeding Bathing Assistance: Maximum assistance Feeding assistance: Limited assistance Dressing Assistance: Maximum assistance     Functional Limitations Info  Sight, Speech, Hearing Sight Info: Impaired (glasses) Hearing Info: Adequate Speech Info: Adequate    SPECIAL CARE FACTORS FREQUENCY  PT (By licensed PT), OT (By licensed OT)     PT Frequency: 5x week OT Frequency: 5x week            Contractures Contractures Info: Not present    Additional Factors Info  Code Status, Allergies Code Status Info: DNR limited Allergies Info: Penicillins           Current Medications (11/14/2023):  This is the current hospital active medication list Current Facility-Administered Medications  Medication Dose Route Frequency Provider Last Rate Last Admin   acetaminophen (TYLENOL) tablet 650 mg  650 mg Oral Q6H PRN Madelyn Flavors A, MD   650 mg at 11/12/23 0913   Or   acetaminophen (TYLENOL) suppository 650 mg  650 mg Rectal Q6H PRN Madelyn Flavors A, MD       albuterol (PROVENTIL) (2.5 MG/3ML) 0.083% nebulizer solution 2.5 mg  2.5 mg Nebulization Q6H PRN Katrinka Blazing, Rondell A, MD   2.5 mg at 11/10/23 1228   apixaban (ELIQUIS) tablet 2.5 mg  2.5 mg Oral BID Quenton Fetter, RPH   2.5 mg at 11/14/23 1610   atorvastatin (LIPITOR) tablet 10 mg  10 mg Oral QHS Coralie Keens, MD   10 mg at 11/13/23 2149   diltiazem (CARDIZEM) tablet 120 mg  120 mg Oral  Q12H Sande Rives, MD   120 mg at 11/14/23 9604   feeding supplement (ENSURE ENLIVE / ENSURE PLUS) liquid 237 mL  237 mL Oral TID BM Arrien, York Ram, MD   237 mL at 11/13/23 2153   HYDROmorphone (DILAUDID) injection 0.5 mg  0.5 mg Intravenous Q2H PRN Arrien, York Ram, MD       multivitamin with minerals tablet 1 tablet  1 tablet Oral Daily Arrien, York Ram, MD   1 tablet at 11/14/23 0952   ondansetron (ZOFRAN) injection 4 mg  4 mg Intravenous Q6H PRN Arrien, York Ram, MD       pantoprazole (PROTONIX) EC tablet 40 mg  40 mg Oral Daily Coralie Keens, MD   40 mg at 11/14/23 5409   sodium chloride flush (NS) 0.9 % injection 3 mL  3 mL Intravenous Q12H Madelyn Flavors A, MD   3 mL at 11/14/23 0956   torsemide (DEMADEX) tablet 40 mg  40 mg Oral Daily Arrien, York Ram, MD   40 mg at 11/14/23 8119     Discharge Medications: Please see discharge summary for a list of discharge medications.  Relevant Imaging Results:  Relevant Lab Results:   Additional Information SSN [id number]  [name], [place]

## 2023-11-14 NOTE — Progress Notes (Signed)
Progress Note   Patient: Christina Morgan HYQ:[id number] DOB: Jan 14, 1933 DOA: 11/08/2023     6 DOS: the patient was seen and examined on 11/14/2023   Brief hospital course: Mrs. Simental was admitted to the hospital with the working diagnosis of atrial fibrillation with RVR in the setting of heart failure decompensation.   89 yo female with the past medical history of hypertension, hyperlipidemia, paroxysmal atrial fibrillation, and CKD who presented with worsening lower extremity edema. Worsening symptoms for 7 days, which progressed to dyspnea over the last 24 hrs prior to admission. Her diuretic dose was increased at SNF with no significant improvement in her symptoms. She was seen at [facility] ED, found volume overloaded and in atrial fibrillation with RVR, transferred to [facility] for further management.  His heart rate was 169 bpm, RR 18 to 29, blood pressure 136/97 and 02 saturation 97%, lungs with rales bilaterally with no wheezing or rhonchi, positive JVD, heart with S1 and S2 present, irregularly irregular with no gallops, abdomen not distended and positive lower extremity edema.   Na 144, K 3,6 Cl 104, bicarbonate 26, glucose 124, BUN 21 cr 1,29  BNP 871  High sensitive troponin 9 and 9  Wbc 8,3 hgb 10,8 plt 345  Sars covid 19 negative   Chest radiograph with cardiomegaly, bilateral hilar vascular congestion and cephalization of the vasculature. Bilateral pleural effusions.   EKG 155 bpm, right axis deviation, atrial fibrillation rhythm with poor R R wave progression, no significant ST segment or T wave changes.   [place] lower extremities negative for deep vein thrombosis.   Patient was placed on diltiazem infusion and IV furosemide.    11/29 patient with improvement in her symptoms with better rate controlled atrial fibrillation.  11/30 worsening renal function. She has developed acute metabolic encephalopathy.  12/01 volume status has improved, but she continue with high 02  requirements.  12/02 patient on regular high flow nasal cannula, continue very weak and deconditioned.  12/03 clinically is improving, plan to transfer to SNF when bed available.   Assessment and Plan: * Acute on chronic diastolic CHF (congestive heart failure) (HCC) Echocardiogram with preserved LV systolic function with EF 55 to 60%, RV with preserved systolic function, RVSP 44,6 mmHg, LA with moderate dilatation, mild to moderate aortic valve regurgitation.   Acute on chronic core pulmonale.  Documented urine output is 1,000 ml  Systolic blood  pressure 140 to 130  mmHg.   Patient had metolazone on 11/29  Continue diuresis withl torsemide 40 mg daily.   Acute hypoxemic respiratory failure due to acute cardiogenic pulmonary edema.   11/29 follow up chest radiograph with persistent signs of pulmonary edema.  12/02 follow up chest radiograph with improvement in pulmonary edema but not completely resolved. Positive bilateral pleural effusions.   Patient with improvement in 02 requirements, today she is 97% on 3 L/min per regular nasal cannula.   Acute metabolic encephalopathy, has resolved.   PAF (paroxysmal atrial fibrillation) (HCC) In the past she has been on B blocker, flecainide and amiodarone, all discontinued due to patient converting to sinus rhythm with bradycardia. 04/2022 had direct current cardioversion, follow up 05/2022 patient was back in atrial fibrillation.   Continue rate control with oral diltiazem 120 mg bid.  Anticoagulation with apixaban.  Continue telemetry monitoring.   Chronic kidney disease, stage 3b (HCC) AKI, cardio renal syndrome.   Renal US with with increases echogenicity of the bilateral renal cortices, consistent with medical renal disease.  Moderate left hydronephrosis.  Not able to place foley catheter.  Volume status continue to improve, renal function today with serum cr at 1.97 with K at 3,4 and serum bicarbonate at 36.  Na 146   Add  40 kcl po today Continue with torsemide and continue monitoring renal functio and electrolytes.   Essential hypertension, benign Continue blood pressure monitoring.  Continue with diltiazem.   GERD (gastroesophageal reflux disease) Continue pantoprazole.  Dyslipidemia Continue statin therapy.   Malnutrition of moderate degree Continue with nutritional supplements.         Subjective: Patient is more awake and alert, dyspnea and edema have improved. No chest pain, continue very weak and deconditioned   Physical Exam: Vitals:   11/14/23 1247 11/14/23 1500 11/14/23 1531 11/14/23 1600  BP: (!) 149/75  (!) 139/59   Pulse: 77  98   Resp: 20 20 20    Temp: 97.8 F (36.6 C)  (!) 97.4 F (36.3 C)   TempSrc: Oral  Oral   SpO2: 97% 97% 96% 97%  Weight:      Height:       Neurology awake and alert ENT with mild pallor Cardiovascular with S1 and S2 present, irregularly irregular with no gallops, rubs or murmurs Mild JVD No lower extremity edema Respiratory with mild rales at bases with no wheezing or rhonchi Abdomen with no distention  Data Reviewed:    Family Communication: I spoke with patient's daughter at the bedside, we talked in detail about patient's condition, plan of care and prognosis and all questions were addressed.   Disposition: Status is: Inpatient Remains inpatient appropriate because: pending transfer to SNF   Planned Discharge Destination: Skilled nursing facility   Author: Coralie Keens, MD 11/14/2023 5:53 PM  For on call review [email or web address].

## 2023-11-14 NOTE — Plan of Care (Signed)

## 2023-11-14 NOTE — Plan of Care (Signed)
  Problem: Education: Goal: Knowledge of General Education information will improve Description: Including pain rating scale, medication(s)/side effects and non-pharmacologic comfort measures Outcome: Progressing   Problem: Health Behavior/Discharge Planning: Goal: Ability to manage health-related needs will improve Outcome: Progressing   Problem: Clinical Measurements: Goal: Ability to maintain clinical measurements within normal limits will improve Outcome: Progressing Goal: Will remain free from infection Outcome: Progressing Goal: Diagnostic test results will improve Outcome: Progressing Goal: Respiratory complications will improve Outcome: Progressing Goal: Cardiovascular complication will be avoided Outcome: Progressing   Problem: Activity: Goal: Risk for activity intolerance will decrease Outcome: Progressing   Problem: Coping: Goal: Level of anxiety will decrease Outcome: Progressing   Problem: Elimination: Goal: Will not experience complications related to bowel motility Outcome: Progressing Goal: Will not experience complications related to urinary retention Outcome: Progressing   Problem: Pain Management: Goal: General experience of comfort will improve Outcome: Progressing   Problem: Safety: Goal: Ability to remain free from injury will improve Outcome: Progressing   Problem: Skin Integrity: Goal: Risk for impaired skin integrity will decrease Outcome: Progressing

## 2023-11-14 NOTE — Plan of Care (Signed)
  Problem: Clinical Measurements: Goal: Cardiovascular complication will be avoided Outcome: Progressing   Problem: Elimination: Goal: Will not experience complications related to urinary retention Outcome: Progressing   Problem: Safety: Goal: Ability to remain free from injury will improve Outcome: Progressing

## 2023-11-14 NOTE — Assessment & Plan Note (Signed)
Continue with nutritional supplements.  

## 2023-11-14 NOTE — Progress Notes (Incomplete)
PROGRESS NOTE    Christina Morgan  UEA:[id number] DOB: 04/22/1933 DOA: 11/08/2023 PCP: [facility]  89/F w hypertension, hyperlipidemia, paroxysmal atrial fibrillation, and CKD who presented with worsening lower extremity edema.dyspnea over the last 24 hrs prior to admission. Her diuretic dose was increased at SNF with no significant improvement in her symptoms. She was seen at [facility] ED, found volume overloaded and in atrial fibrillation with RVR, transferred to [facility] . In ED, Afib RVR positive JVD,positive lower extremity edema. BUN 21 cr 1,29 BNP 871 troponin 9 and 9 hgb 10,8 CXR w cardiomegaly, bilateral hilar vascular congestion and cephalization of the vasculature. Bilateral pleural effusions.  [place] lower extremities negative for deep vein thrombosis.  -placed on diltiazem infusion and IV furosemide.   11/29 patient with improvement in her symptoms with better rate controlled atrial fibrillation.  11/30 worsening renal function. She has developed acute metabolic encephalopathy.  12/1  she continues to have high 02 requirements.  12/02 on high flow nasal cannula, remains very weak and deconditioned   Subjective:  Assessment and Plan:  Acute on chronic diastolic CHF  -Echo with EF 55 to 60%, RV with preserved systolic function, RVSP 44,6 mmHg, LA with moderate dilatation, mild to moderate aortic valve re -had metolazone on 11/29  Continue diuresis withl torsemide 40 mg daily.   Acute hypoxemic respiratory failure due to acute cardiogenic pulmonary edema.  11/29 follow up chest radiograph with persistent signs of pulmonary edema.  12/2 follow up chest radiograph with improvement in pulmonary edema but not completely resolved. Positive bilateral pleural effusions.  -with improvement in 02 requirements, today she is 97% on 3 L/min per regular nasal cannula.   Acute metabolic encephalopathy, has resolved.   PAF (paroxysmal atrial fibrillation) (HCC) In the past she has  been on B blocker, flecainide and amiodarone, all discontinued due to patient converting to sinus rhythm with bradycardia. 04/2022 had direct current cardioversion, follow up 05/2022 patient was back in atrial fibrillation.  Continue rate control with oral diltiazem 120 mg bid.  -apixaban continued  Chronic kidney disease, stage 3b (HCC) AKI, cardio renal syndrome.  Today her renal function, her creat 2.23, BUN 49, K 3.7,  -Renal US with with increases echogenicity of the bilateral renal cortices, consistent with medical renal disease. Moderate left hydronephrosis.  -Not able to place foley catheter.  -Resume diuresis with oral loop diuretic with torsemide.   Essential hypertension, benign Continue blood pressure monitoring.  Continue with diltiazem.   GERD (gastroesophageal reflux disease) Continue pantoprazole.  Dyslipidemia Continue statin therapy.   DVT prophylaxis: apixaban Code Status: DNR Family Communication: Disposition Plan:   Consultants:    Procedures:   Antimicrobials:    Objective: Vitals:   11/14/23 1247 11/14/23 1500 11/14/23 1531 11/14/23 1600  BP: (!) 149/75  (!) 139/59   Pulse: 77  98   Resp: 20 20 20    Temp: 97.8 F (36.6 C)  (!) 97.4 F (36.3 C)   TempSrc: Oral  Oral   SpO2: 97% 97% 96% 97%  Weight:      Height:        Intake/Output Summary (Last 24 hours) at 11/14/2023 1752 Last data filed at 11/14/2023 1500 Gross per 24 hour  Intake 180 ml  Output 2125 ml  Net -1945 ml   Filed Weights   11/12/23 0450 11/13/23 0337 11/14/23 0525  Weight: 72 kg 71.9 kg 71.6 kg    Examination:      Data Reviewed:   CBC: Recent Labs  Lab 11/08/23 1223 11/09/23 0230  WBC 8.3 5.5  HGB 10.8* 10.0*  HCT 33.8* 31.9*  MCV 83.0 82.4  PLT 345 310   Basic Metabolic Panel: Recent Labs  Lab 11/08/23 1719 11/09/23 0230 11/10/23 0244 11/11/23 0227 11/12/23 0301 11/13/23 0345 11/14/23 0806  NA  --    < > 139 140 144 145 146*  K  --    < >  4.6 4.8 3.9 3.7 3.4*  CL  --    < > 104 105 102 104 98  CO2  --    < > 23 25 29 31  36*  GLUCOSE  --    < > 150* 127* 96 100* 103*  BUN  --    < > 27* 40* 48* 49* 44*  CREATININE  --    < > 1.92* 2.17* 2.53* 2.23* 1.97*  CALCIUM  --    < > 9.5 9.4 9.1 8.8* 9.2  MG 1.8  --  2.5* 2.6*  --   --   --   PHOS  --   --  5.8*  --  7.2* 5.7*  --    < > = values in this interval not displayed.   GFR: Estimated Creatinine Clearance: 19.2 mL/min (A) (by C-G formula based on SCr of 1.97 mg/dL (H)). Liver Function Tests: Recent Labs  Lab 11/08/23 1223 11/12/23 0301 11/13/23 0345  AST 17  --   --   ALT 13  --   --   ALKPHOS 102  --   --   BILITOT 0.7  --   --   PROT 7.0  --   --   ALBUMIN 4.1 3.1* 3.1*   No results for input(s): "LIPASE", "AMYLASE" in the last 168 hours. No results for input(s): "AMMONIA" in the last 168 hours. Coagulation Profile: No results for input(s): "INR", "PROTIME" in the last 168 hours. Cardiac Enzymes: No results for input(s): "CKTOTAL", "CKMB", "CKMBINDEX", "TROPONINI" in the last 168 hours. BNP (last 3 results) No results for input(s): "PROBNP" in the last 8760 hours. HbA1C: No results for input(s): "HGBA1C" in the last 72 hours. CBG: No results for input(s): "GLUCAP" in the last 168 hours. Lipid Profile: No results for input(s): "CHOL", "HDL", "LDLCALC", "TRIG", "CHOLHDL", "LDLDIRECT" in the last 72 hours. Thyroid Function Tests: No results for input(s): "TSH", "T4TOTAL", "FREET4", "T3FREE", "THYROIDAB" in the last 72 hours. Anemia Panel: No results for input(s): "VITAMINB12", "FOLATE", "FERRITIN", "TIBC", "IRON", "RETICCTPCT" in the last 72 hours. Urine analysis:    Component Value Date/Time   COLORURINE YELLOW 03/06/2022 1939   APPEARANCEUR HAZY (A) 03/06/2022 1939   LABSPEC 1.025 03/06/2022 1939   PHURINE 5.0 03/06/2022 1939   GLUCOSEU NEGATIVE 03/06/2022 1939   HGBUR TRACE (A) 03/06/2022 1939   BILIRUBINUR NEGATIVE 03/06/2022 1939   KETONESUR 15  (A) 03/06/2022 1939   PROTEINUR 30 (A) 03/06/2022 1939   UROBILINOGEN 0.2 06/19/2014 2131   NITRITE NEGATIVE 03/06/2022 1939   LEUKOCYTESUR MODERATE (A) 03/06/2022 1939   Sepsis Labs: @LABRCNTIP (procalcitonin:4,lacticidven:4)  ) Recent Results (from the past 240 hour(s))  Resp panel by RT-PCR (RSV, Flu A&B, Covid) Anterior Nasal Swab     Status: None   Collection Time: 11/08/23 12:23 PM   Specimen: Anterior Nasal Swab  Result Value Ref Range Status   SARS Coronavirus 2 by RT PCR NEGATIVE NEGATIVE Final    Comment: (NOTE) SARS-CoV-2 target nucleic acids are NOT DETECTED.  The SARS-CoV-2 RNA is generally detectable in upper respiratory specimens during the acute  phase of infection. The lowest concentration of SARS-CoV-2 viral copies this assay can detect is 138 copies/mL. A negative result does not preclude SARS-Cov-2 infection and should not be used as the sole basis for treatment or other patient management decisions. A negative result may occur with  improper specimen collection/handling, submission of specimen other than nasopharyngeal swab, presence of viral mutation(s) within the areas targeted by this assay, and inadequate number of viral copies(<138 copies/mL). A negative result must be combined with clinical observations, patient history, and epidemiological information. The expected result is Negative.  Fact Sheet for Patients:  BloggerCourse.com  Fact Sheet for Healthcare Providers:  SeriousBroker.it  This test is no t yet approved or cleared by the [place] FDA and  has been authorized for detection and/or diagnosis of SARS-CoV-2 by FDA under an Emergency Use Authorization (EUA). This EUA will remain  in effect (meaning this test can be used) for the duration of the COVID-19 declaration under Section 564(b)(1) of the Act, 21 U.S.C.section 360bbb-3(b)(1), unless the authorization is terminated  or revoked  sooner.       Influenza A by PCR NEGATIVE NEGATIVE Final   Influenza B by PCR NEGATIVE NEGATIVE Final    Comment: (NOTE) The Xpert Xpress SARS-CoV-2/FLU/RSV plus assay is intended as an aid in the diagnosis of influenza from Nasopharyngeal swab specimens and should not be used as a sole basis for treatment. Nasal washings and aspirates are unacceptable for Xpert Xpress SARS-CoV-2/FLU/RSV testing.  Fact Sheet for Patients: BloggerCourse.com  Fact Sheet for Healthcare Providers: SeriousBroker.it  This test is not yet approved or cleared by the [place] FDA and has been authorized for detection and/or diagnosis of SARS-CoV-2 by FDA under an Emergency Use Authorization (EUA). This EUA will remain in effect (meaning this test can be used) for the duration of the COVID-19 declaration under Section 564(b)(1) of the Act, 21 U.S.C. section 360bbb-3(b)(1), unless the authorization is terminated or revoked.     Resp Syncytial Virus by PCR NEGATIVE NEGATIVE Final    Comment: (NOTE) Fact Sheet for Patients: BloggerCourse.com  Fact Sheet for Healthcare Providers: SeriousBroker.it  This test is not yet approved or cleared by the [place] FDA and has been authorized for detection and/or diagnosis of SARS-CoV-2 by FDA under an Emergency Use Authorization (EUA). This EUA will remain in effect (meaning this test can be used) for the duration of the COVID-19 declaration under Section 564(b)(1) of the Act, 21 U.S.C. section 360bbb-3(b)(1), unless the authorization is terminated or revoked.  Performed at [facility], [address], [place], [place] [address]      Radiology Studies: DG Chest 1 View  Result Date: 11/13/2023 CLINICAL DATA:  Follow-up, according to epic notes atrial fibrillation, heart failure decompensation EXAM: CHEST  1 VIEW COMPARISON:   11/10/2023, 11/08/2023 FINDINGS: Cardiac enlargement with vascular congestion and at least moderate size pleural effusions, slightly increased compared to prior. Suspect background pulmonary edema. Bibasilar consolidations. Aortic atherosclerosis. Old appearing left humerus deformity. IMPRESSION: Cardiac enlargement with vascular congestion and at least moderate size pleural effusions, slightly increased compared to prior. Suspect background pulmonary edema. Bibasilar consolidations. Electronically Signed   By: Jasmine Pang M.D.   On: 11/13/2023 18:00     Scheduled Meds:  apixaban  2.5 mg Oral BID   atorvastatin  10 mg Oral QHS   diltiazem  120 mg Oral Q12H   feeding supplement  237 mL Oral TID BM   multivitamin with minerals  1 tablet Oral Daily  pantoprazole  40 mg Oral Daily   sodium chloride flush  3 mL Intravenous Q12H   torsemide  40 mg Oral Daily   Continuous Infusions:   LOS: 6 days    Time spent:    Zannie Cove, MD [facility]   11/14/2023, 5:52 PM

## 2023-11-15 DIAGNOSIS — I5033 Acute on chronic diastolic (congestive) heart failure: Secondary | ICD-10-CM | POA: Diagnosis not present

## 2023-11-15 LAB — BASIC METABOLIC PANEL
Anion gap: 11 (ref 5–15)
BUN: 43 mg/dL — ABNORMAL HIGH (ref 8–23)
CO2: 40 mmol/L — ABNORMAL HIGH (ref 22–32)
Calcium: 9.3 mg/dL (ref 8.9–10.3)
Chloride: 95 mmol/L — ABNORMAL LOW (ref 98–111)
Creatinine, Ser: 1.84 mg/dL — ABNORMAL HIGH (ref 0.44–1.00)
GFR, Estimated: 26 mL/min — ABNORMAL LOW (ref >60)
Glucose, Bld: 114 mg/dL — ABNORMAL HIGH (ref 70–99)
Potassium: 3.8 mmol/L (ref 3.5–5.1)
Sodium: 146 mmol/L — ABNORMAL HIGH (ref 135–145)

## 2023-11-15 LAB — CBC
HCT: 37.9 % (ref 36.0–46.0)
Hemoglobin: 11.6 g/dL — ABNORMAL LOW (ref 12.0–15.0)
MCH: 26.1 pg (ref 26.0–34.0)
MCHC: 30.6 g/dL (ref 30.0–36.0)
MCV: 85.4 fL (ref 80.0–100.0)
Platelets: 348 10*3/uL (ref 150–400)
RBC: 4.44 MIL/uL (ref 3.87–5.11)
RDW: 15.8 % — ABNORMAL HIGH (ref 11.5–15.5)
WBC: 7.8 10*3/uL (ref 4.0–10.5)
nRBC: 0 % (ref 0.0–0.2)

## 2023-11-15 MED ORDER — DILTIAZEM HCL ER COATED BEADS 180 MG PO CP24
300.0000 mg | ORAL_CAPSULE | Freq: Every day | ORAL | Status: DC
  Administered 2023-11-15 – 2023-11-22 (×8): 300 mg via ORAL
  Filled 2023-11-15 (×8): qty 1

## 2023-11-15 MED ORDER — METOPROLOL TARTRATE 25 MG PO TABS
25.0000 mg | ORAL_TABLET | Freq: Two times a day (BID) | ORAL | Status: DC
  Administered 2023-11-15 – 2023-11-22 (×15): 25 mg via ORAL
  Filled 2023-11-15 (×15): qty 1

## 2023-11-15 NOTE — Plan of Care (Signed)
  Problem: Education: Goal: Knowledge of General Education information will improve Description: Including pain rating scale, medication(s)/side effects and non-pharmacologic comfort measures Outcome: Progressing   Problem: Health Behavior/Discharge Planning: Goal: Ability to manage health-related needs will improve Outcome: Progressing   Problem: Clinical Measurements: Goal: Ability to maintain clinical measurements within normal limits will improve Outcome: Progressing Goal: Will remain free from infection Outcome: Progressing Goal: Diagnostic test results will improve Outcome: Progressing Goal: Respiratory complications will improve Outcome: Progressing Goal: Cardiovascular complication will be avoided Outcome: Progressing   Problem: Activity: Goal: Risk for activity intolerance will decrease Outcome: Progressing   Problem: Coping: Goal: Level of anxiety will decrease Outcome: Progressing   Problem: Elimination: Goal: Will not experience complications related to bowel motility Outcome: Progressing Goal: Will not experience complications related to urinary retention Outcome: Progressing   Problem: Pain Management: Goal: General experience of comfort will improve Outcome: Progressing   Problem: Safety: Goal: Ability to remain free from injury will improve Outcome: Progressing   Problem: Skin Integrity: Goal: Risk for impaired skin integrity will decrease Outcome: Progressing

## 2023-11-15 NOTE — Progress Notes (Signed)
? ?  Inpatient Rehab Admissions Coordinator : ? ?Per therapy recommendations, patient was screened for CIR candidacy by Blue Ruggerio RN MSN.  At this time patient appears to be a potential candidate for CIR. I will place a rehab consult per protocol for full assessment. Please call me with any questions. ? ?Jerriyah Louis RN MSN ?Admissions Coordinator ?[phone] ?  ?

## 2023-11-15 NOTE — Progress Notes (Signed)
Physical Therapy Treatment Patient Details Name: Christina Morgan MRN: [id number] DOB: Jun 30, 1933 Today's Date: 11/15/2023   History of Present Illness Patient is an 89 y.o. female who presents to [facility] hospital on 11/08/2023 for acute on chronic diastolic HF. PMH includes CKD, HLD, HTN, PAF, and endometrial cancer.    PT Comments  Of note, pt's daughter reports that the pt is more lethargic today and slurring her speech more. This PT did note slurred speech and weakness in her R hand (she is R-handed). She was able to visually track bil but often would drift back to midline after tracking to the L. Nystagmus was noted when tracking, unclear if it was direction changing or not. No other focal weakness noted. Pt has curvature of her spine resulting in a R trunk lean but L head tilt, which seems to be baseline. She was also slower to process cues this date. Notified RN and MD. Pt required increased assistance of maxA for bed mobility, modA to transfer to stand, and maxA to pivot to the chair from the bed with HHA. Recommending nursing utilize stedy with +2 assist for transfers at this time. Pt performed a couple seated exercises once sitting in the chair to further strengthen her legs. Will continue to follow acutely. Coordinated with AIR coordinators, who think pt may be a candidate. Updated recs to reflect this.  Of note, BP 99/80 after transfer to chair, 111/66 after sitting in chair several more minutes, SBP to 130s after seated LE exercises   If plan is discharge home, recommend the following: Assistance with cooking/housework;Assist for transportation;Help with stairs or ramp for entrance;Two people to help with walking and/or transfers;A lot of help with bathing/dressing/bathroom;Direct supervision/assist for medications management;Direct supervision/assist for financial management   Can travel by private vehicle     No  Equipment Recommendations  BSC/3in1    Recommendations for Other  Services Rehab consult     Precautions / Restrictions Precautions Precautions: Fall Precaution Comments: watch BP Restrictions Weight Bearing Restrictions: No     Mobility  Bed Mobility Overal bed mobility: Needs Assistance Bed Mobility: Supine to Sit     Supine to sit: HOB elevated, Used rails, Max assist     General bed mobility comments: Pt required multi-modal cues to sequence bringing legs off EOB and bringing L hand to rail. Pt did not bring R hand to rail as cued. Pt needed assistance to bring each leg off EOB, ascend trunk, and scoot hips to EOB    Transfers Overall transfer level: Needs assistance Equipment used: Rolling walker (2 wheels), 1 person hand held assist Transfers: Sit to/from Stand, Bed to chair/wheelchair/BSC Sit to Stand: Mod assist Stand pivot transfers: Max assist         General transfer comment: Pt attempted to stand from EOB 3x before being successful. She initially tried with RW but was unable to stand more than half way. She was successful with anterior approach assistance was provided by therapist, cuing pt to hold onto therapist for support. ModA to stand from EOB but maxA to pivot hips to R to transfer to chair. x1 additional stand and pivot to R in chair to get more midline in seat.    Ambulation/Gait               General Gait Details: unable at this time   Stairs             Wheelchair Mobility     Tilt Bed    Modified  Rankin (Stroke Patients Only)       Balance Overall balance assessment: Needs assistance Sitting-balance support: Feet supported, Single extremity supported Sitting balance-Leahy Scale: Poor Sitting balance - Comments: Pt leans to the R and needs min-modA for static sitting balance, intermittent CGA when propped more towards her R elbow   Standing balance support: Bilateral upper extremity supported, During functional activity, Reliant on assistive device for balance Standing balance-Leahy  Scale: Poor Standing balance comment: reliant on UE support and external physical assistance                            Cognition Arousal: Alert Behavior During Therapy: Flat affect Overall Cognitive Status: Impaired/Different from baseline Area of Impairment: Following commands, Safety/judgement, Problem solving, Attention                   Current Attention Level: Sustained   Following Commands: Follows one step commands consistently, Follows one step commands with increased time, Follows multi-step commands inconsistently Safety/Judgement: Decreased awareness of safety, Decreased awareness of deficits   Problem Solving: Slow processing, Difficulty sequencing, Requires verbal cues General Comments: Pt much more lethargic this date per daughter, with pt also admitting feeling tired today. Easy to wake pt but noted pt to have a flat affect throughout. Pt was slow to process cues and needed increased time and multi-modal cuing to sequence mobility. Pt with decreased insight into her deficits, often stating "I need to do it alone".        Exercises General Exercises - Lower Extremity Long Arc Quad: AROM, Both, 10 reps, Seated Hip Flexion/Marching: AROM, Both, 10 reps, Seated    General Comments General comments (skin integrity, edema, etc.): BP 99/80 after transfers to chair, 111/66 after sitting in chair several more minutes, SBP to 130s after seated LE exercises; pt's daughter reports that the pt is more lethargic today and slurring her speech more. This PT did note slurred speech and weakness in her R hand (she is R-handed). She was able to visually track bil but often would drift back to midline after tracking to the L. Nystagmus was noted when tracking, unclear if it was direction changing or not. No other focal weakness noted. Pt has curvature of her spine resulting in a R trunk lean but L head tilt, seems to be baseline.      Pertinent Vitals/Pain Pain  Assessment Pain Assessment: Faces Faces Pain Scale: Hurts little more Pain Location: generalized with mobility Pain Descriptors / Indicators: Discomfort, Grimacing Pain Intervention(s): Limited activity within patient's tolerance, Monitored during session, Repositioned    Home Living                          Prior Function            PT Goals (current goals can now be found in the care plan section) Acute Rehab PT Goals Patient Stated Goal: To get better PT Goal Formulation: With patient/family Time For Goal Achievement: 11/27/23 Potential to Achieve Goals: Good Progress towards PT goals: Not progressing toward goals - comment (functional decline)    Frequency    Min 1X/week      PT Plan      Co-evaluation              AM-PAC PT "6 Clicks" Mobility   Outcome Measure  Help needed turning from your back to your side while in a flat bed without  using bedrails?: A Lot Help needed moving from lying on your back to sitting on the side of a flat bed without using bedrails?: A Lot Help needed moving to and from a bed to a chair (including a wheelchair)?: A Lot Help needed standing up from a chair using your arms (e.g., wheelchair or bedside chair)?: A Lot Help needed to walk in hospital room?: Total Help needed climbing 3-5 steps with a railing? : Total 6 Click Score: 10    End of Session Equipment Utilized During Treatment: Gait belt;Oxygen Activity Tolerance: Patient tolerated treatment well Patient left: with call bell/phone within reach;in chair;with chair alarm set Nurse Communication: Mobility status;Need for lift equipment;Other (comment) (stedy; pt's concerning presentation and low BP today) PT Visit Diagnosis: Muscle weakness (generalized) (M62.81);Other abnormalities of gait and mobility (R26.89);Unsteadiness on feet (R26.81)     Time: 9629-5284 PT Time Calculation (min) (ACUTE ONLY): 37 min  Charges:    $Therapeutic Exercise: 8-22  mins $Therapeutic Activity: 8-22 mins PT General Charges $$ ACUTE PT VISIT: 1 Visit                     Virgil Benedict, PT, DPT Acute Rehabilitation Services  Office: [phone]    Bettina Gavia 11/15/2023, 4:06 PM

## 2023-11-15 NOTE — TOC Progression Note (Addendum)
Transition of Care [facility]) - Progression Note    Patient Details  Name: Christina Morgan MRN: [id number] Date of Birth: Dec 01, 1933  [facility]) CM/SW Contact  [name], [place] Phone Number: 11/15/2023, 1:53 PM  Clinical Narrative:   CSW met pts dtr and talked about SNF recommendations. Pts daughter remains uncertain about proceeding with SNF and inquired about pt dc back to ALF and having PT work with her their. CSW will reach out to pts ALF in regard to pt dc back there.   CSW left VM for care director at ALF.  4:28PM: ALF care director called CSW back. CSW expressed families interest in pt returning to ALF and doing some sort of PT with the facility or HH. Care director stated she will talk to pts son first and follow up with CSW.      Barriers to Discharge: Continued Medical Work up, SNF Pending bed offer  Expected Discharge Plan and Services       Living arrangements for the past 2 months: Assisted Living Facility                 DME Arranged: Dan Humphreys, Government social research officer                     Social Determinants of Health (SDOH) Interventions SDOH Screenings   Food Insecurity: No Food Insecurity (11/09/2023)  Housing: Low Risk  (11/09/2023)  Transportation Needs: No Transportation Needs (11/09/2023)  Utilities: Not At Risk (11/09/2023)  Financial Resource Strain: Low Risk  (09/27/2019)  Physical Activity: Inactive (09/27/2019)  Social Connections: Unknown (04/15/2022)   [facility]  Stress: No Stress Concern Present (09/27/2019)  Tobacco Use: Low Risk  (11/08/2023)    Readmission Risk Interventions    03/11/2022   11:40 AM  Readmission Risk Prevention Plan  Transportation Screening Complete  Medication Review (RN Care Manager) Complete  PCP or Specialist appointment within 3-5 days of discharge Complete  HRI or Home Care Consult Complete  SW Recovery Care/Counseling Consult Complete  Palliative Care  Screening Not Applicable  Skilled Nursing Facility Not Applicable

## 2023-11-16 ENCOUNTER — Inpatient Hospital Stay (HOSPITAL_COMMUNITY): Payer: Medicare Other

## 2023-11-16 DIAGNOSIS — I5033 Acute on chronic diastolic (congestive) heart failure: Secondary | ICD-10-CM | POA: Diagnosis not present

## 2023-11-16 LAB — URINALYSIS, ROUTINE W REFLEX MICROSCOPIC
Bilirubin Urine: NEGATIVE
Glucose, UA: NEGATIVE mg/dL
Ketones, ur: NEGATIVE mg/dL
Nitrite: POSITIVE — AB
Protein, ur: 30 mg/dL — AB
Specific Gravity, Urine: 1.016 (ref 1.005–1.030)
pH: 8 (ref 5.0–8.0)

## 2023-11-16 LAB — BASIC METABOLIC PANEL
Anion gap: 14 (ref 5–15)
BUN: 47 mg/dL — ABNORMAL HIGH (ref 8–23)
CO2: 37 mmol/L — ABNORMAL HIGH (ref 22–32)
Calcium: 9.7 mg/dL (ref 8.9–10.3)
Chloride: 95 mmol/L — ABNORMAL LOW (ref 98–111)
Creatinine, Ser: 1.87 mg/dL — ABNORMAL HIGH (ref 0.44–1.00)
GFR, Estimated: 26 mL/min — ABNORMAL LOW (ref >60)
Glucose, Bld: 109 mg/dL — ABNORMAL HIGH (ref 70–99)
Potassium: 4.5 mmol/L (ref 3.5–5.1)
Sodium: 146 mmol/L — ABNORMAL HIGH (ref 135–145)

## 2023-11-16 LAB — CBC
HCT: 37.6 % (ref 36.0–46.0)
Hemoglobin: 11.7 g/dL — ABNORMAL LOW (ref 12.0–15.0)
MCH: 26.4 pg (ref 26.0–34.0)
MCHC: 31.1 g/dL (ref 30.0–36.0)
MCV: 84.9 fL (ref 80.0–100.0)
Platelets: 329 10*3/uL (ref 150–400)
RBC: 4.43 MIL/uL (ref 3.87–5.11)
RDW: 15.5 % (ref 11.5–15.5)
WBC: 7.8 10*3/uL (ref 4.0–10.5)
nRBC: 0 % (ref 0.0–0.2)

## 2023-11-16 LAB — AMMONIA: Ammonia: 27 umol/L (ref 9–35)

## 2023-11-16 MED ORDER — TORSEMIDE 20 MG PO TABS
40.0000 mg | ORAL_TABLET | Freq: Every day | ORAL | Status: DC
  Administered 2023-11-16 – 2023-11-18 (×3): 40 mg via ORAL
  Filled 2023-11-16 (×3): qty 2

## 2023-11-16 MED ORDER — POLYETHYLENE GLYCOL 3350 17 G PO PACK
17.0000 g | PACK | Freq: Two times a day (BID) | ORAL | Status: DC
  Administered 2023-11-16 – 2023-11-21 (×9): 17 g via ORAL
  Filled 2023-11-16 (×9): qty 1

## 2023-11-16 MED ORDER — SODIUM CHLORIDE 0.9 % IV SOLN
1.0000 g | INTRAVENOUS | Status: DC
  Administered 2023-11-16 – 2023-11-19 (×4): 1 g via INTRAVENOUS
  Filled 2023-11-16 (×4): qty 10

## 2023-11-16 NOTE — Progress Notes (Signed)
OT Cancellation Note  Patient Details Name: Christina Morgan MRN: [id number] DOB: [birth year]   Cancelled Treatment:    Reason Eval/Treat Not Completed: Medical issues which prohibited therapy (Attempted to see pt for OT treatment with RN ok. Upon arrival, pt found to be obtunded and difficult to wake. Consulted RN with RN reporting pt is starting on antibiotics for new UTI. OT to hold at this time and reattempt later as appropriate/available.)  Rosanne Sack "Orson Eva., OTR/L, MA Acute Rehab [phone]   Lendon Colonel 11/16/2023, 4:44 PM

## 2023-11-16 NOTE — Plan of Care (Signed)
  Problem: Education: Goal: Knowledge of General Education information will improve Description: Including pain rating scale, medication(s)/side effects and non-pharmacologic comfort measures Outcome: Progressing   Problem: Health Behavior/Discharge Planning: Goal: Ability to manage health-related needs will improve Outcome: Progressing   Problem: Clinical Measurements: Goal: Ability to maintain clinical measurements within normal limits will improve Outcome: Progressing Goal: Will remain free from infection Outcome: Progressing Goal: Diagnostic test results will improve Outcome: Progressing Goal: Respiratory complications will improve Outcome: Progressing Goal: Cardiovascular complication will be avoided Outcome: Progressing   Problem: Activity: Goal: Risk for activity intolerance will decrease Outcome: Progressing   Problem: Coping: Goal: Level of anxiety will decrease Outcome: Progressing   Problem: Elimination: Goal: Will not experience complications related to bowel motility Outcome: Progressing Goal: Will not experience complications related to urinary retention Outcome: Progressing   Problem: Pain Management: Goal: General experience of comfort will improve Outcome: Progressing   Problem: Safety: Goal: Ability to remain free from injury will improve Outcome: Progressing   Problem: Skin Integrity: Goal: Risk for impaired skin integrity will decrease Outcome: Progressing

## 2023-11-16 NOTE — Progress Notes (Signed)
Mobility Specialist Progress Note:   11/16/23 1153  Mobility  Activity Transferred from bed to chair  Level of Assistance +2 (takes two people) (ModA)  Assistive Device Stedy  Activity Response Tolerated well  Mobility Referral Yes  Mobility visit 1 Mobility  Mobility Specialist Start Time (ACUTE ONLY) 0940  Mobility Specialist Stop Time (ACUTE ONLY) [id number]  Mobility Specialist Time Calculation (min) (ACUTE ONLY) 16 min   Pt received in bed agreeable to mobility. Pt needed ModA +2 for bed mobility and STS. Was able to transfer to the recliner w/o fault. No c/o throughout. All needs met w/ call bell and personal belongings in reach. Chair alarm on.   Thompson Grayer Mobility Specialist  Please contact vis Secure Chat or  Rehab Office [phone]

## 2023-11-16 NOTE — Progress Notes (Addendum)
PROGRESS NOTE    Christina Morgan  WUJ:[id number] DOB: 1933-07-10 DOA: 11/08/2023 PCP: [facility]  89/F w hypertension, hyperlipidemia, paroxysmal atrial fibrillation, and CKD who presented with worsening lower extremity edema.dyspnea over the last 24 hrs prior to admission. Her diuretic dose was increased at SNF with no significant improvement in her symptoms. She was seen at [facility] ED, found volume overloaded and in atrial fibrillation with RVR, transferred to [facility] . In ED, Afib RVR positive JVD,positive lower extremity edema. BUN 21 cr 1,29 BNP 871 troponin 9 and 9 hgb 10,8 CXR w cardiomegaly, bilateral hilar vascular congestion and cephalization of the vasculature. Bilateral pleural effusions.  [place] lower extremities negative for deep vein thrombosis.  -placed on diltiazem infusion and IV furosemide.   11/29 patient with improvement in her symptoms with better rate controlled atrial fibrillation.  11/30 worsening renal function. She has developed acute metabolic encephalopathy.  12/1  she continues to have high 02 requirements.  12/02 on high flow nasal cannula, remains very weak and deconditioned   Subjective: some earache, feels better overall, still on O2  Assessment and Plan:  Acute on chronic diastolic CHF  Pulm Hypertension -Echo with EF 55 to 60%, RV with preserved systolic function, RVSP 44,6 mmHg, LA with moderate dilatation, mild to moderate aortic valve re -had metolazone on 11/29  -was improving w/ diuretics, 5L negative -Clinically still appears volume overloaded, restarting torsemide today -Repeat chest x-ray -prognosis poor , Cards suggestive conservative mgt ? Hospice and signed off  Acute hypoxemic respiratory failure due to acute cardiogenic pulmonary edema.  -As above, wean O2  Acute metabolic encephalopathy,  -Improved but still with some fluctuation, check urinalysis, component of hospital delirium also likely contributing  PAF (paroxysmal  atrial fibrillation) (HCC) In the past she has been on B blocker, flecainide and amiodarone, all discontinued due to patient converting to sinus rhythm with bradycardia. 04/2022 had direct current cardioversion, follow up 05/2022 patient was back in atrial fibrillation.  -Continue Cardizem CD, started metoprolol -apixaban continued -per Cards rate control strategy recommended  Chronic kidney disease, stage 3b (HCC) AKI, cardio renal syndrome.  -improving -Renal US with with increases echogenicity of the bilateral renal cortices, consistent with medical renal disease. Moderate left hydronephrosis.  -Restart torsemide  Essential hypertension, benign Continue diltiazem.   GERD (gastroesophageal reflux disease) Continue pantoprazole.  Dyslipidemia Continue statin therapy.   DVT prophylaxis: apixaban Code Status: DNR Family Communication: none present, will update daughter Disposition Plan: To be determined  Consultants:    Procedures:   Antimicrobials:    Objective: Vitals:   11/15/23 1945 11/16/23 0010 11/16/23 0521 11/16/23 0820  BP: 122/66 131/79 136/78 126/76  Pulse: 87 92 81 (!) 104  Resp: 19 20 19 20   Temp: 97.6 F (36.4 C) (!) 97.5 F (36.4 C) 98.2 F (36.8 C) 97.8 F (36.6 C)  TempSrc: Oral Oral Oral Oral  SpO2: 100% 94% 92% 92%  Weight:   69.8 kg   Height:        Intake/Output Summary (Last 24 hours) at 11/16/2023 1213 Last data filed at 11/16/2023 0521 Gross per 24 hour  Intake 120 ml  Output 690 ml  Net -570 ml   Filed Weights   11/14/23 0525 11/15/23 0459 11/16/23 0521  Weight: 71.6 kg 70.6 kg 69.8 kg    Examination:  Gen: Awake, Alert, Oriented X 3, elderly chronically ill HEENT: +JVD Lungs: Poor air movement, decreased breath sounds at the bases CVS: S1S2/irregular Abd: soft, Non tender, non  distended, BS present Extremities: No edema Skin: no new rashes on exposed skin     Data Reviewed:   CBC: Recent Labs  Lab 11/15/23 0257  11/16/23 0234  WBC 7.8 7.8  HGB 11.6* 11.7*  HCT 37.9 37.6  MCV 85.4 84.9  PLT 348 329   Basic Metabolic Panel: Recent Labs  Lab 11/10/23 0244 11/11/23 0227 11/12/23 0301 11/13/23 0345 11/14/23 0806 11/15/23 0257 11/16/23 0234  NA 139 140 144 145 146* 146* 146*  K 4.6 4.8 3.9 3.7 3.4* 3.8 4.5  CL 104 105 102 104 98 95* 95*  CO2 23 25 29 31  36* 40* 37*  GLUCOSE 150* 127* 96 100* 103* 114* 109*  BUN 27* 40* 48* 49* 44* 43* 47*  CREATININE 1.92* 2.17* 2.53* 2.23* 1.97* 1.84* 1.87*  CALCIUM 9.5 9.4 9.1 8.8* 9.2 9.3 9.7  MG 2.5* 2.6*  --   --   --   --   --   PHOS 5.8*  --  7.2* 5.7*  --   --   --    GFR: Estimated Creatinine Clearance: 19.9 mL/min (A) (by C-G formula based on SCr of 1.87 mg/dL (H)). Liver Function Tests: Recent Labs  Lab 11/12/23 0301 11/13/23 0345  ALBUMIN 3.1* 3.1*   No results for input(s): "LIPASE", "AMYLASE" in the last 168 hours. No results for input(s): "AMMONIA" in the last 168 hours. Coagulation Profile: No results for input(s): "INR", "PROTIME" in the last 168 hours. Cardiac Enzymes: No results for input(s): "CKTOTAL", "CKMB", "CKMBINDEX", "TROPONINI" in the last 168 hours. BNP (last 3 results) No results for input(s): "PROBNP" in the last 8760 hours. HbA1C: No results for input(s): "HGBA1C" in the last 72 hours. CBG: No results for input(s): "GLUCAP" in the last 168 hours. Lipid Profile: No results for input(s): "CHOL", "HDL", "LDLCALC", "TRIG", "CHOLHDL", "LDLDIRECT" in the last 72 hours. Thyroid Function Tests: No results for input(s): "TSH", "T4TOTAL", "FREET4", "T3FREE", "THYROIDAB" in the last 72 hours. Anemia Panel: No results for input(s): "VITAMINB12", "FOLATE", "FERRITIN", "TIBC", "IRON", "RETICCTPCT" in the last 72 hours. Urine analysis:    Component Value Date/Time   COLORURINE YELLOW 03/06/2022 1939   APPEARANCEUR HAZY (A) 03/06/2022 1939   LABSPEC 1.025 03/06/2022 1939   PHURINE 5.0 03/06/2022 1939   GLUCOSEU NEGATIVE  03/06/2022 1939   HGBUR TRACE (A) 03/06/2022 1939   BILIRUBINUR NEGATIVE 03/06/2022 1939   KETONESUR 15 (A) 03/06/2022 1939   PROTEINUR 30 (A) 03/06/2022 1939   UROBILINOGEN 0.2 06/19/2014 2131   NITRITE NEGATIVE 03/06/2022 1939   LEUKOCYTESUR MODERATE (A) 03/06/2022 1939   Sepsis Labs: @LABRCNTIP (procalcitonin:4,lacticidven:4)  ) Recent Results (from the past 240 hour(s))  Resp panel by RT-PCR (RSV, Flu A&B, Covid) Anterior Nasal Swab     Status: None   Collection Time: 11/08/23 12:23 PM   Specimen: Anterior Nasal Swab  Result Value Ref Range Status   SARS Coronavirus 2 by RT PCR NEGATIVE NEGATIVE Final    Comment: (NOTE) SARS-CoV-2 target nucleic acids are NOT DETECTED.  The SARS-CoV-2 RNA is generally detectable in upper respiratory specimens during the acute phase of infection. The lowest concentration of SARS-CoV-2 viral copies this assay can detect is 138 copies/mL. A negative result does not preclude SARS-Cov-2 infection and should not be used as the sole basis for treatment or other patient management decisions. A negative result may occur with  improper specimen collection/handling, submission of specimen other than nasopharyngeal swab, presence of viral mutation(s) within the areas targeted by this assay, and inadequate number  of viral copies(<138 copies/mL). A negative result must be combined with clinical observations, patient history, and epidemiological information. The expected result is Negative.  Fact Sheet for Patients:  BloggerCourse.com  Fact Sheet for Healthcare Providers:  SeriousBroker.it  This test is no t yet approved or cleared by the [place] FDA and  has been authorized for detection and/or diagnosis of SARS-CoV-2 by FDA under an Emergency Use Authorization (EUA). This EUA will remain  in effect (meaning this test can be used) for the duration of the COVID-19 declaration under Section  564(b)(1) of the Act, 21 U.S.C.section 360bbb-3(b)(1), unless the authorization is terminated  or revoked sooner.       Influenza A by PCR NEGATIVE NEGATIVE Final   Influenza B by PCR NEGATIVE NEGATIVE Final    Comment: (NOTE) The Xpert Xpress SARS-CoV-2/FLU/RSV plus assay is intended as an aid in the diagnosis of influenza from Nasopharyngeal swab specimens and should not be used as a sole basis for treatment. Nasal washings and aspirates are unacceptable for Xpert Xpress SARS-CoV-2/FLU/RSV testing.  Fact Sheet for Patients: BloggerCourse.com  Fact Sheet for Healthcare Providers: SeriousBroker.it  This test is not yet approved or cleared by the [place] FDA and has been authorized for detection and/or diagnosis of SARS-CoV-2 by FDA under an Emergency Use Authorization (EUA). This EUA will remain in effect (meaning this test can be used) for the duration of the COVID-19 declaration under Section 564(b)(1) of the Act, 21 U.S.C. section 360bbb-3(b)(1), unless the authorization is terminated or revoked.     Resp Syncytial Virus by PCR NEGATIVE NEGATIVE Final    Comment: (NOTE) Fact Sheet for Patients: BloggerCourse.com  Fact Sheet for Healthcare Providers: SeriousBroker.it  This test is not yet approved or cleared by the [place] FDA and has been authorized for detection and/or diagnosis of SARS-CoV-2 by FDA under an Emergency Use Authorization (EUA). This EUA will remain in effect (meaning this test can be used) for the duration of the COVID-19 declaration under Section 564(b)(1) of the Act, 21 U.S.C. section 360bbb-3(b)(1), unless the authorization is terminated or revoked.  Performed at [facility], [address] [place], [place] [address]      Radiology Studies: DG CHEST PORT 1 VIEW  Result Date: 11/16/2023 CLINICAL DATA:  Hypoxia.  EXAM: PORTABLE CHEST 1 VIEW COMPARISON:  11/13/2023 and 11/10/2023. FINDINGS: Vascular congestion and hazy airspace lung opacities have improved from prior exam. Linear opacity extending from the right hilum along minor fissure is less prominent, suspected to be atelectasis or fissural fluid. Moderate bilateral pleural effusions are similar, obscuring both hemidiaphragms. No pneumothorax. Cardiac silhouette is enlarged, stable. IMPRESSION: 1. Interval improvement in lung aeration consistent with improved pulmonary edema. No new abnormalities. 2. Persistent moderate bilateral pleural effusions. Electronically Signed   By: Amie Portland M.D.   On: 11/16/2023 10:33     Scheduled Meds:  apixaban  2.5 mg Oral BID   atorvastatin  10 mg Oral QHS   diltiazem  300 mg Oral Daily   feeding supplement  237 mL Oral TID BM   metoprolol tartrate  25 mg Oral BID   multivitamin with minerals  1 tablet Oral Daily   pantoprazole  40 mg Oral Daily   polyethylene glycol  17 g Oral BID   sodium chloride flush  3 mL Intravenous Q12H   torsemide  40 mg Oral Daily   Continuous Infusions:   LOS: 8 days    Time spent:    Zannie Cove, MD  [facility]   11/16/2023, 12:13 PM

## 2023-11-16 NOTE — Progress Notes (Signed)
[facility] IP rehab admissions - I spoke with attending MD today and she recommended that we hold on rehab consult for 48 hours due to request for palliative care and because of decline in patient status.  We will follow at a distance over the next couple days.  Call for questions.  [phone]

## 2023-11-17 DIAGNOSIS — I509 Heart failure, unspecified: Secondary | ICD-10-CM | POA: Diagnosis not present

## 2023-11-17 DIAGNOSIS — Z7189 Other specified counseling: Secondary | ICD-10-CM | POA: Diagnosis not present

## 2023-11-17 DIAGNOSIS — Z515 Encounter for palliative care: Secondary | ICD-10-CM | POA: Diagnosis not present

## 2023-11-17 DIAGNOSIS — I5033 Acute on chronic diastolic (congestive) heart failure: Secondary | ICD-10-CM | POA: Diagnosis not present

## 2023-11-17 DIAGNOSIS — N1832 Chronic kidney disease, stage 3b: Secondary | ICD-10-CM | POA: Diagnosis not present

## 2023-11-17 LAB — CBC
HCT: 39.6 % (ref 36.0–46.0)
Hemoglobin: 11.6 g/dL — ABNORMAL LOW (ref 12.0–15.0)
MCH: 25.4 pg — ABNORMAL LOW (ref 26.0–34.0)
MCHC: 29.3 g/dL — ABNORMAL LOW (ref 30.0–36.0)
MCV: 86.7 fL (ref 80.0–100.0)
Platelets: 303 10*3/uL (ref 150–400)
RBC: 4.57 MIL/uL (ref 3.87–5.11)
RDW: 15.5 % (ref 11.5–15.5)
WBC: 8.2 10*3/uL (ref 4.0–10.5)
nRBC: 0 % (ref 0.0–0.2)

## 2023-11-17 LAB — COMPREHENSIVE METABOLIC PANEL
ALT: 24 U/L (ref 0–44)
AST: 26 U/L (ref 15–41)
Albumin: 3.3 g/dL — ABNORMAL LOW (ref 3.5–5.0)
Alkaline Phosphatase: 96 U/L (ref 38–126)
Anion gap: 11 (ref 5–15)
BUN: 50 mg/dL — ABNORMAL HIGH (ref 8–23)
CO2: 42 mmol/L — ABNORMAL HIGH (ref 22–32)
Calcium: 9.7 mg/dL (ref 8.9–10.3)
Chloride: 94 mmol/L — ABNORMAL LOW (ref 98–111)
Creatinine, Ser: 1.91 mg/dL — ABNORMAL HIGH (ref 0.44–1.00)
GFR, Estimated: 25 mL/min — ABNORMAL LOW (ref >60)
Glucose, Bld: 119 mg/dL — ABNORMAL HIGH (ref 70–99)
Potassium: 3.7 mmol/L (ref 3.5–5.1)
Sodium: 147 mmol/L — ABNORMAL HIGH (ref 135–145)
Total Bilirubin: 0.7 mg/dL (ref <1.2)
Total Protein: 6.4 g/dL — ABNORMAL LOW (ref 6.5–8.1)

## 2023-11-17 NOTE — Progress Notes (Signed)
PROGRESS NOTE    Christina Morgan  UEA:[id number] DOB: 05-20-1933 DOA: 11/08/2023 PCP: [facility]  89/F w hypertension, hyperlipidemia, paroxysmal atrial fibrillation, and CKD who presented with worsening lower extremity edema.dyspnea over the last 24 hrs prior to admission. Her diuretic dose was increased at SNF with no significant improvement in her symptoms. She was seen at [facility] ED, found volume overloaded and in atrial fibrillation with RVR, transferred to [facility] . In ED, Afib RVR positive JVD,positive lower extremity edema. BUN 21 cr 1,29 BNP 871 troponin 9 and 9 hgb 10,8 CXR w cardiomegaly, bilateral hilar vascular congestion and cephalization of the vasculature. Bilateral pleural effusions.  [place] lower extremities negative for deep vein thrombosis.  -placed on diltiazem infusion and IV furosemide.   11/29 patient with improvement in her symptoms with better rate controlled atrial fibrillation.  11/30 worsening renal function. She has developed acute metabolic encephalopathy.  12/1  she continues to have high 02 requirements.  12/02 on high flow nasal cannula, remains very weak and deconditioned -12/4 and 5 with some confusion, lethargy UA abnormal on 12/5, started antibiotics   Subjective: Seems more awake and appropriate this morning  Assessment and Plan:  Acute on chronic diastolic CHF  Pulm Hypertension -Echo with EF 55 to 60%, RV with preserved systolic function, RVSP 44,6 mmHg, LA with moderate dilatation, mild to moderate aortic valve re -had metolazone on 11/29  -Diuresed on IV Lasix, 6.3 L negative -Now on torsemide, may need dose lowered -prognosis not great, Cards suggestive conservative mgt ? Hospice and signed off  Acute hypoxemic respiratory failure due to acute cardiogenic pulmonary edema.  -As above, wean O2  Acute metabolic encephalopathy,  -Marginal improvement, abnormal UA concerning for UTI, started antibiotics yesterday, UA was reflexed to  culture but unfortunately does not seem like culture was sent, too late now we will plan Abx for 5 days  PAF (paroxysmal atrial fibrillation) (HCC) In the past she has been on B blocker, flecainide and amiodarone, all discontinued due to patient converting to sinus rhythm with bradycardia. 04/2022 had direct current cardioversion, follow up 05/2022 patient was back in atrial fibrillation.  -Continue Cardizem CD, started metoprolol -apixaban continued -per Cards rate control strategy recommended  Chronic kidney disease, stage 3b (HCC) AKI, cardio renal syndrome.  -improving -Renal US with with increases echogenicity of the bilateral renal cortices, consistent with medical renal disease. Moderate left hydronephrosis.  -Continue torsemide  Essential hypertension, benign Continue diltiazem.   GERD (gastroesophageal reflux disease) Continue pantoprazole.  Dyslipidemia Continue statin therapy.   DVT prophylaxis: apixaban Code Status: DNR Family Communication: none present, updated daughter yesterday Disposition Plan: To be determined  Consultants:    Procedures:   Antimicrobials:    Objective: Vitals:   11/17/23 0310 11/17/23 0649 11/17/23 0711 11/17/23 1028  BP: 125/67  (!) 149/80 122/73  Pulse: 87  87 88  Resp: 20  17 (!) 21  Temp: 97.6 F (36.4 C)  97.6 F (36.4 C)   TempSrc: Oral  Oral   SpO2: 96%  100% 96%  Weight:  70 kg    Height:        Intake/Output Summary (Last 24 hours) at 11/17/2023 1217 Last data filed at 11/17/2023 4782 Gross per 24 hour  Intake 0 ml  Output 1380 ml  Net -1380 ml   Filed Weights   11/15/23 0459 11/16/23 0521 11/17/23 0649  Weight: 70.6 kg 69.8 kg 70 kg    Examination:  Gen: Chronically ill elderly female laying  in bed, sleepy but arousable, answers questions, more appropriate today HEENT: +JVD Lungs: Poor air movement, decreased breath sounds at the bases CVS: S1S2/irregular Abd: soft, Non tender, non distended, BS  present Extremities: No edema Skin: no new rashes on exposed skin     Data Reviewed:   CBC: Recent Labs  Lab 11/15/23 0257 11/16/23 0234 11/17/23 0244  WBC 7.8 7.8 8.2  HGB 11.6* 11.7* 11.6*  HCT 37.9 37.6 39.6  MCV 85.4 84.9 86.7  PLT 348 329 303   Basic Metabolic Panel: Recent Labs  Lab 11/11/23 0227 11/12/23 0301 11/13/23 0345 11/14/23 0806 11/15/23 0257 11/16/23 0234 11/17/23 0244  NA 140 144 145 146* 146* 146* 147*  K 4.8 3.9 3.7 3.4* 3.8 4.5 3.7  CL 105 102 104 98 95* 95* 94*  CO2 25 29 31  36* 40* 37* 42*  GLUCOSE 127* 96 100* 103* 114* 109* 119*  BUN 40* 48* 49* 44* 43* 47* 50*  CREATININE 2.17* 2.53* 2.23* 1.97* 1.84* 1.87* 1.91*  CALCIUM 9.4 9.1 8.8* 9.2 9.3 9.7 9.7  MG 2.6*  --   --   --   --   --   --   PHOS  --  7.2* 5.7*  --   --   --   --    GFR: Estimated Creatinine Clearance: 19.5 mL/min (A) (by C-G formula based on SCr of 1.91 mg/dL (H)). Liver Function Tests: Recent Labs  Lab 11/12/23 0301 11/13/23 0345 11/17/23 0244  AST  --   --  26  ALT  --   --  24  ALKPHOS  --   --  96  BILITOT  --   --  0.7  PROT  --   --  6.4*  ALBUMIN 3.1* 3.1* 3.3*   No results for input(s): "LIPASE", "AMYLASE" in the last 168 hours. Recent Labs  Lab 11/16/23 1536  AMMONIA 27   Coagulation Profile: No results for input(s): "INR", "PROTIME" in the last 168 hours. Cardiac Enzymes: No results for input(s): "CKTOTAL", "CKMB", "CKMBINDEX", "TROPONINI" in the last 168 hours. BNP (last 3 results) No results for input(s): "PROBNP" in the last 8760 hours. HbA1C: No results for input(s): "HGBA1C" in the last 72 hours. CBG: No results for input(s): "GLUCAP" in the last 168 hours. Lipid Profile: No results for input(s): "CHOL", "HDL", "LDLCALC", "TRIG", "CHOLHDL", "LDLDIRECT" in the last 72 hours. Thyroid Function Tests: No results for input(s): "TSH", "T4TOTAL", "FREET4", "T3FREE", "THYROIDAB" in the last 72 hours. Anemia Panel: No results for input(s):  "VITAMINB12", "FOLATE", "FERRITIN", "TIBC", "IRON", "RETICCTPCT" in the last 72 hours. Urine analysis:    Component Value Date/Time   COLORURINE YELLOW 11/16/2023 1210   APPEARANCEUR CLOUDY (A) 11/16/2023 1210   LABSPEC 1.016 11/16/2023 1210   PHURINE 8.0 11/16/2023 1210   GLUCOSEU NEGATIVE 11/16/2023 1210   HGBUR SMALL (A) 11/16/2023 1210   BILIRUBINUR NEGATIVE 11/16/2023 1210   KETONESUR NEGATIVE 11/16/2023 1210   PROTEINUR 30 (A) 11/16/2023 1210   UROBILINOGEN 0.2 06/19/2014 2131   NITRITE POSITIVE (A) 11/16/2023 1210   LEUKOCYTESUR TRACE (A) 11/16/2023 1210   Sepsis Labs: @LABRCNTIP (procalcitonin:4,lacticidven:4)  ) Recent Results (from the past 240 hour(s))  Resp panel by RT-PCR (RSV, Flu A&B, Covid) Anterior Nasal Swab     Status: None   Collection Time: 11/08/23 12:23 PM   Specimen: Anterior Nasal Swab  Result Value Ref Range Status   SARS Coronavirus 2 by RT PCR NEGATIVE NEGATIVE Final    Comment: (NOTE) SARS-CoV-2 target nucleic acids  are NOT DETECTED.  The SARS-CoV-2 RNA is generally detectable in upper respiratory specimens during the acute phase of infection. The lowest concentration of SARS-CoV-2 viral copies this assay can detect is 138 copies/mL. A negative result does not preclude SARS-Cov-2 infection and should not be used as the sole basis for treatment or other patient management decisions. A negative result may occur with  improper specimen collection/handling, submission of specimen other than nasopharyngeal swab, presence of viral mutation(s) within the areas targeted by this assay, and inadequate number of viral copies(<138 copies/mL). A negative result must be combined with clinical observations, patient history, and epidemiological information. The expected result is Negative.  Fact Sheet for Patients:  BloggerCourse.com  Fact Sheet for Healthcare Providers:  SeriousBroker.it  This test is no t  yet approved or cleared by the [place] FDA and  has been authorized for detection and/or diagnosis of SARS-CoV-2 by FDA under an Emergency Use Authorization (EUA). This EUA will remain  in effect (meaning this test can be used) for the duration of the COVID-19 declaration under Section 564(b)(1) of the Act, 21 U.S.C.section 360bbb-3(b)(1), unless the authorization is terminated  or revoked sooner.       Influenza A by PCR NEGATIVE NEGATIVE Final   Influenza B by PCR NEGATIVE NEGATIVE Final    Comment: (NOTE) The Xpert Xpress SARS-CoV-2/FLU/RSV plus assay is intended as an aid in the diagnosis of influenza from Nasopharyngeal swab specimens and should not be used as a sole basis for treatment. Nasal washings and aspirates are unacceptable for Xpert Xpress SARS-CoV-2/FLU/RSV testing.  Fact Sheet for Patients: BloggerCourse.com  Fact Sheet for Healthcare Providers: SeriousBroker.it  This test is not yet approved or cleared by the [place] FDA and has been authorized for detection and/or diagnosis of SARS-CoV-2 by FDA under an Emergency Use Authorization (EUA). This EUA will remain in effect (meaning this test can be used) for the duration of the COVID-19 declaration under Section 564(b)(1) of the Act, 21 U.S.C. section 360bbb-3(b)(1), unless the authorization is terminated or revoked.     Resp Syncytial Virus by PCR NEGATIVE NEGATIVE Final    Comment: (NOTE) Fact Sheet for Patients: BloggerCourse.com  Fact Sheet for Healthcare Providers: SeriousBroker.it  This test is not yet approved or cleared by the [place] FDA and has been authorized for detection and/or diagnosis of SARS-CoV-2 by FDA under an Emergency Use Authorization (EUA). This EUA will remain in effect (meaning this test can be used) for the duration of the COVID-19 declaration under Section 564(b)(1)  of the Act, 21 U.S.C. section 360bbb-3(b)(1), unless the authorization is terminated or revoked.  Performed at [facility], [address], [place], [place] [address]      Radiology Studies: DG CHEST PORT 1 VIEW  Result Date: 11/16/2023 CLINICAL DATA:  Hypoxia. EXAM: PORTABLE CHEST 1 VIEW COMPARISON:  11/13/2023 and 11/10/2023. FINDINGS: Vascular congestion and hazy airspace lung opacities have improved from prior exam. Linear opacity extending from the right hilum along minor fissure is less prominent, suspected to be atelectasis or fissural fluid. Moderate bilateral pleural effusions are similar, obscuring both hemidiaphragms. No pneumothorax. Cardiac silhouette is enlarged, stable. IMPRESSION: 1. Interval improvement in lung aeration consistent with improved pulmonary edema. No new abnormalities. 2. Persistent moderate bilateral pleural effusions. Electronically Signed   By: Amie Portland M.D.   On: 11/16/2023 10:33     Scheduled Meds:  apixaban  2.5 mg Oral BID   atorvastatin  10 mg Oral QHS   diltiazem  300 mg Oral Daily   feeding supplement  237 mL Oral TID BM   metoprolol tartrate  25 mg Oral BID   multivitamin with minerals  1 tablet Oral Daily   pantoprazole  40 mg Oral Daily   polyethylene glycol  17 g Oral BID   sodium chloride flush  3 mL Intravenous Q12H   torsemide  40 mg Oral Daily   Continuous Infusions:  cefTRIAXone (ROCEPHIN)  IV 1 g (11/16/23 1635)     LOS: 9 days    Time spent:    Zannie Cove, MD [facility]   11/17/2023, 12:17 PM

## 2023-11-17 NOTE — Progress Notes (Addendum)
Physical Therapy Treatment Patient Details Name: Christina Morgan MRN: [id number] DOB: [birth year] Today's Date: 11/17/2023   History of Present Illness Patient is an 89 y.o. female who presents to [facility] hospital on 11/08/2023 for acute on chronic diastolic HF. UTI 12/5. PMH includes CKD, HLD, HTN, PAF, and endometrial cancer.    PT Comments  Pt is more lethargic today. She continues to have some slurred speech as well. She needed frequent stimulation to attend to task/cues. Pt stated her name was "Christina Morgan" and perseverated on this for a little while. Focused session on balance training sitting EOB and increasing lower extremity strength through repeated standing reps and through seated LAQs. Pt did progress from needing min-modA for static sitting balance to being able to sit for short periods of time (up to ~30 seconds) with only CGA for safety. Will continue to follow acutely.     If plan is discharge home, recommend the following: Assistance with cooking/housework;Assist for transportation;Help with stairs or ramp for entrance;Two people to help with walking and/or transfers;A lot of help with bathing/dressing/bathroom;Direct supervision/assist for medications management;Direct supervision/assist for financial management   Can travel by private vehicle     No  Equipment Recommendations  BSC/3in1;Hoyer lift;Hospital bed    Recommendations for Other Services Rehab consult     Precautions / Restrictions Precautions Precautions: Fall Precaution Comments: watch BP Restrictions Weight Bearing Restrictions: No     Mobility  Bed Mobility Overal bed mobility: Needs Assistance Bed Mobility: Supine to Sit, Sit to Supine     Supine to sit: HOB elevated, Total assist Sit to supine: Mod assist, +2 for physical assistance, +2 for safety/equipment, HOB elevated   General bed mobility comments: Pt not initiating movement of legs or trunk to sit up R EOB, needing total assist today. Pt did  initiate laying her trunk back to supine, but did not aim her trunk, needing modAx2 to ensure her trunk pivoted to midline in bed and to lift her legs onto bed.    Transfers Overall transfer level: Needs assistance Equipment used: 2 person hand held assist Transfers: Sit to/from Stand, Bed to chair/wheelchair/BSC Sit to Stand: Mod assist, +2 physical assistance, +2 safety/equipment Stand pivot transfers: Max assist, +2 physical assistance, +2 safety/equipment         General transfer comment: Bil knees blocked and bil HHA provided. Used bed pad as sling under pt's buttocks. Pt cued to bring her chest anteriorly for anterior weight shift and to stand on the count of 3. ModAx2 needed to stand 2x and maxAx2 needed to pivot to R towards HOB 2x.    Ambulation/Gait               General Gait Details: unable at this time   Stairs             Wheelchair Mobility     Tilt Bed    Modified Rankin (Stroke Patients Only)       Balance Overall balance assessment: Needs assistance Sitting-balance support: Feet supported, Single extremity supported Sitting balance-Leahy Scale: Poor Sitting balance - Comments: Min-modA for static sitting balance initially, progressing to bouts of up to ~30 sec of CGA. Pt sat EOB >10 min   Standing balance support: Bilateral upper extremity supported, During functional activity, Reliant on assistive device for balance Standing balance-Leahy Scale: Poor Standing balance comment: reliant on UE support and external physical assistance  Cognition Arousal: Lethargic Behavior During Therapy: WFL for tasks assessed/performed Overall Cognitive Status: Impaired/Different from baseline Area of Impairment: Following commands, Safety/judgement, Problem solving, Attention, Orientation, Memory, Awareness                 Orientation Level: Disoriented to, Person Current Attention Level: Focused Memory:  Decreased short-term memory Following Commands: Follows one step commands with increased time, Follows one step commands inconsistently Safety/Judgement: Decreased awareness of safety, Decreased awareness of deficits Awareness: Intellectual Problem Solving: Slow processing, Difficulty sequencing, Requires verbal cues, Decreased initiation, Requires tactile cues General Comments: Pt more lethargic today, keeping eyes closed majority of session. Pt would open eyes briefly when cued. Poor initiation with mobility, needing extensive cues and assistance today. Followed simple cues ~75% of time with repetition. Stated her name was "Christina Morgan" and perseverated on "Christina Morgan". Knew she was in [place].        Exercises General Exercises - Lower Extremity Long Arc Quad: AROM, Both, 10 reps, Seated Hip Flexion/Marching: AROM, Both, 10 reps, Seated    General Comments        Pertinent Vitals/Pain Pain Assessment Pain Assessment: Faces Faces Pain Scale: Hurts little more Pain Location: generalized with mobility Pain Descriptors / Indicators: Discomfort, Grimacing Pain Intervention(s): Limited activity within patient's tolerance, Monitored during session, Repositioned    Home Living                          Prior Function            PT Goals (current goals can now be found in the care plan section) Acute Rehab PT Goals Patient Stated Goal: To get better PT Goal Formulation: With patient Time For Goal Achievement: 11/27/23 Potential to Achieve Goals: Fair Progress towards PT goals: Not progressing toward goals - comment (pt lethargic)    Frequency    Min 1X/week      PT Plan      Co-evaluation              AM-PAC PT "6 Clicks" Mobility   Outcome Measure  Help needed turning from your back to your side while in a flat bed without using bedrails?: Total Help needed moving from lying on your back to sitting on the side of a flat bed without using bedrails?:  Total Help needed moving to and from a bed to a chair (including a wheelchair)?: Total Help needed standing up from a chair using your arms (e.g., wheelchair or bedside chair)?: Total Help needed to walk in hospital room?: Total Help needed climbing 3-5 steps with a railing? : Total 6 Click Score: 6    End of Session Equipment Utilized During Treatment: Gait belt;Oxygen Activity Tolerance: Patient limited by lethargy Patient left: with call bell/phone within reach;in bed;with bed alarm set Nurse Communication: Mobility status (pt lethargic) PT Visit Diagnosis: Muscle weakness (generalized) (M62.81);Other abnormalities of gait and mobility (R26.89);Unsteadiness on feet (R26.81)     Time: 1027-2536 PT Time Calculation (min) (ACUTE ONLY): 21 min  Charges:    $Therapeutic Exercise: 8-22 mins PT General Charges $$ ACUTE PT VISIT: 1 Visit                     Virgil Benedict, PT, DPT Acute Rehabilitation Services  Office: [phone]    Bettina Gavia 11/17/2023, 1:04 PM

## 2023-11-17 NOTE — Progress Notes (Signed)
Warming blanket applied.

## 2023-11-17 NOTE — Progress Notes (Signed)
Inpatient Rehab Admissions Coordinator:    CIR following;however, she is not currently appropriate for CIR. Palliative consult is pending. Will re-consider once goals of care have been clarified.   Megan Salon, MS, CCC-SLP Rehab Admissions Coordinator  [phone] (celll) [phone] (office)

## 2023-11-17 NOTE — Plan of Care (Signed)
  Problem: Clinical Measurements: Goal: Ability to maintain clinical measurements within normal limits will improve Outcome: Progressing   

## 2023-11-17 NOTE — Progress Notes (Signed)
Occupational Therapy Treatment Patient Details Name: Christina Morgan MRN: [id number] DOB: 1933-11-11 Today's Date: 11/17/2023   History of present illness Patient is an 89 y.o. female who presents to [facility] hospital on 11/08/2023 for acute on chronic diastolic HF. UTI 12/5. PMH includes CKD, HLD, HTN, PAF, and endometrial cancer.   OT comments  OT session focused on cognition, improved sitting tolerance and balance during functional tasks, and training in techniques for increased safety and independence with grooming tasks. Pt currently demonstrates ability to complete self-feeding and grooming tasks with Set up to Contact guard assist with occasional cues for sequencing. Pt participated well in session and largely presented with increased alertness and clearer speech this session as compared to attempted OT session on 12/5. Pt making slow progress toward goals. VSS on 2L continuous O2 through nasal cannula. Pt will benefit from continued acute skilled OT services to address deficits outlined below, decrease caregiver burden, and increase safety and independence with functional tasks. Post acute discharge, pt will benefit from intensive inpatient skilled rehab services < 3 hours per day to maximize rehab potential. OT plans to reassess discharge plan next session. Based on pt progress, pt discharge recommendation may be able to upgrade discharge recommendation to intensive inpatient rehab > 3 hours per day.        If plan is discharge home, recommend the following:  Two people to help with walking and/or transfers;Two people to help with bathing/dressing/bathroom;Assistance with cooking/housework;Assistance with feeding;Direct supervision/assist for medications management;Direct supervision/assist for financial management;Assist for transportation;Help with stairs or ramp for entrance;Supervision due to cognitive status (set up for self-feeding)   Equipment Recommendations  Other (comment) (defer to  next level of care)    Recommendations for Other Services      Precautions / Restrictions Precautions Precautions: Fall Precaution Comments: watch BP Restrictions Weight Bearing Restrictions: No       Mobility Bed Mobility Overal bed mobility: Needs Assistance             General bed mobility comments: Pt sitting in receliner at beginning and end of session.    Transfers Overall transfer level: Needs assistance                 General transfer comment: not attempted this session     Balance Overall balance assessment: Needs assistance Sitting-balance support: Single extremity supported, No upper extremity supported, Feet supported (back supported in recliner) Sitting balance-Leahy Scale: Poor Sitting balance - Comments: Pt laregely able to maintain sitting in midline in recliner but requiring Mod-Max assist for initial positioning in midline.                                   ADL either performed or assessed with clinical judgement   ADL Overall ADL's : Needs assistance/impaired Eating/Feeding: Set up;Sitting   Grooming: Supervision/safety;Contact guard assist;Wash/dry hands;Wash/dry face;Oral care;Brushing hair;Sitting;Cueing for sequencing Grooming Details (indicate cue type and reason): occasional cues for sequencing and utilizing B UE during tasks (pt tended to only use R);  Supervision for washing/drying face/hands and oral care; CGA with combing hair                               General ADL Comments: Pt with decreased activity tolerance and fatiguing quickly with functional tasks with pt beginning to fall asleep at end of session.    Extremity/Trunk Assessment  Upper Extremity Assessment Upper Extremity Assessment: Right hand dominant;Generalized weakness   Lower Extremity Assessment Lower Extremity Assessment: Defer to PT evaluation        Vision       Perception     Praxis      Cognition Arousal: Lethargic,  Alert (Initially lethargic but alerted with  repositioning in chair and participation in tasks; pt becoming lethargic again at end of session) Behavior During Therapy: [facility] for tasks assessed/performed Overall Cognitive Status: Impaired/Different from baseline Area of Impairment: Following commands, Safety/judgement, Problem solving, Attention, Orientation, Memory, Awareness                 Orientation Level: Disoriented to, Time, Situation (Pt knows she is in [place] but initially stated she was at home.) Current Attention Level: Focused, Sustained Memory: Decreased short-term memory Following Commands: Follows one step commands with increased time, Follows one step commands inconsistently Safety/Judgement: Decreased awareness of safety, Decreased awareness of deficits Awareness: Emergent Problem Solving: Slow processing, Difficulty sequencing, Requires verbal cues, Decreased initiation, Requires tactile cues General Comments: Pt with increased alertness and clearer speech than when OT attempted to see pt on 12/5. Pt also reporting she is "feeling much better than yesterady." Pt requiring occasional cues to sequence familiar tasks. Pt able to follow approx. 8/10 1-step instructions appropriately throughout session.        Exercises      Shoulder Instructions       General Comments VSS on 10L continuous O2 through nasal cannula throughout session. Pt's daughter arrived at end of session.    Pertinent Vitals/ Pain       Pain Assessment Pain Assessment: Faces Faces Pain Scale: Hurts a little bit Pain Location: generalized with B UE movement Pain Descriptors / Indicators: Discomfort, Grimacing Pain Intervention(s): Limited activity within patient's tolerance, Monitored during session, Repositioned  Home Living                                          Prior Functioning/Environment              Frequency  Min 1X/week        Progress Toward  Goals  OT Goals(current goals can now be found in the care plan section)  Progress towards OT goals: Progressing toward goals  Acute Rehab OT Goals Patient Stated Goal: to continue to feel better  Plan      Co-evaluation                 AM-PAC OT "6 Clicks" Daily Activity     Outcome Measure   Help from another person eating meals?: A Little Help from another person taking care of personal grooming?: A Little Help from another person toileting, which includes using toliet, bedpan, or urinal?: A Lot Help from another person bathing (including washing, rinsing, drying)?: A Lot Help from another person to put on and taking off regular upper body clothing?: A Little Help from another person to put on and taking off regular lower body clothing?: A Lot 6 Click Score: 15    End of Session Equipment Utilized During Treatment: Oxygen  OT Visit Diagnosis: Muscle weakness (generalized) (M62.81);Other (comment) (decrease activity tolerance)   Activity Tolerance Patient tolerated treatment well;Patient limited by fatigue;Patient limited by lethargy   Patient Left in chair;with call bell/phone within reach;with chair alarm set;with family/visitor present   Nurse Communication Mobility status;Other (  comment) (Pt with imporved alertness and ability to participate as compared to 12/5.)        Time: 1246-1317 OT Time Calculation (min): 31 min  Charges: OT General Charges $OT Visit: 1 Visit OT Treatments $Self Care/Home Management : 23-37 mins  Nannette Zill "Orson Eva., OTR/L, MA Acute Rehab [phone]   Lendon Colonel 11/17/2023, 1:37 PM

## 2023-11-17 NOTE — Consult Note (Signed)
Palliative Care Consult Note                                  Date: 11/17/2023   Patient Name: Christina Morgan  DOB: 12/28/1932  MRN: [id number]  Age / Sex: 89 y.o., female  PCP: [facility] Referring Physician: Zannie Cove, MD  Reason for Consultation: Establishing goals of care  HPI/Patient Profile: 89 y.o. female  with past medical history of chronic diastolic CHF, paroxysmal atrial fibrillation, CKD stage IIIb, hypertension, and hyperlipidemia who presented to [facility] ED on 11/08/2023 with worsening lower extremity edema and dyspnea.  She was found to be in volume overload and in atrial fibrillation with RVR.  She was transferred to [facility] and admitted with acute on chronic CHF and acute hypoxemic respiratory failure. Hospital course complicated by acute metabolic encephalopathy, ongoing weakness, and concern for UTI.   Palliative Medicine was consulted for goals of care and medical decision making.  Subjective:   I have reviewed medical records including EPIC notes, labs and imaging, and assessed the patient at bedside. She is OOB to the recliner. No acute complaints other than feeling tired.  Patient's mental status is improved from yesterday, but it is unclear whether she has insight into her medical situation. Daughter/Christina Morgan is present at bedside and requests that we meet outside the room.   I introduced Palliative Medicine as specialized medical care for people living with serious illness. It focuses on providing relief from the symptoms and stress of a serious illness.   Created space and opportunity for daughter to express thoughts and feelings regarding current medical situation. Values and goals of care were attempted to be elicited.  Social History: Patient is widowed.  Her husband passed away in 2013/12/23 in the palliative care unit at [facility].  She has 1 daughter Christina Morgan) and 1 son  Christina Morgan).  Functional Status: Patient resides at ALF at [facility]. Daughter shares that she has thrived there and enjoys the social activities. At baseline, she is mostly wheelchair bound but can wheel herself from her apartment to other areas of the facility. She requires some assistance with bathing and dressing.   Today's Discussion: We discussed patient's current illness and what it means in the larger context of her ongoing co-morbidities. Current clinical status was reviewed. Natural disease trajectory of chronic was discussed.  Daughter verbalizes a clear understanding that patient has progressive disease, that is "not going to get better" as she states.  We discussed that given patient's progressive disease, she has less reserve to recover from an acute illness/hospitalization and will not likely return to her previous baseline.   Daughter shares she was concerned (prior to today) that patient may not survive this hospitalization. We discussed that even though patient has improved today, her overall prognosis is poor and she is at high risk for decompensation.    Provided education and counseling at length on the philosophy and benefits of hospice care. Discussed that it offers a holistic approach to care in the setting of advanced disease, and is about improving quality of life while allowing the natural course to occur. Discussed that the goal is comfort and dignity rather than prolonging life.  Discussed disposition in the setting that patient may not return to her previous baseline. Daughter shares that a representative from ALF has stated patient can return there with the addition of hospice support. The other option would be  SNF/rehab, as recommended by PT/OT.  Daughter agrees that hospice would be appropriate, but shares that her mother adamantly declined hospice when is has been previously recommended. She has also declined any previous discussions about advanced care planning or  advanced directives. Daughter states this makes it difficult to make decisions. We plan for additional discussion tomorrow, after daughter has discussed with her brother.    Review of Systems  Constitutional:  Positive for fatigue.    Objective:   Primary Diagnoses: Present on Admission:  Acute on chronic diastolic CHF (congestive heart failure) (HCC)  PAF (paroxysmal atrial fibrillation) (HCC)  GERD (gastroesophageal reflux disease)  Dyslipidemia  Essential hypertension, benign  Chronic kidney disease, stage 3b (HCC)   Physical Exam Vitals reviewed.  Constitutional:      General: She is not in acute distress.    Comments: Chronically ill-appearing  Pulmonary:     Effort: No respiratory distress.  Neurological:     Mental Status: She is alert.     Motor: Weakness present.     Palliative Assessment/Data: PPS 40%     Assessment & Plan:   SUMMARY OF RECOMMENDATIONS   Continue current scope of care with goal for medical stabilization Daughter is considering addition of hospice support at ALF, needs to discuss further with her brother PMT will follow-up tomorrow  Primary Decision Maker: Unclear if patient is decisional - will continue to assess Next of kin are her 2 children  Code Status/Advance Care Planning: DNR/DNI  Symptom Management:  Per attending  Prognosis:  < 6 months would not be surprising  Discharge Planning:  To Be Determined   Discussed with: Dr. Jomarie Longs, [facility], PT/OT   Thank you for allowing [place] to participate in the care of Christina Morgan   Time Total: 90 minutes  Detailed review of medical records (labs, imaging, vital signs), medically appropriate exam, discussed with treatment team, counseling and education to patient, family, & staff, documenting clinical information, medication management, coordination of care.  Signed by: Sherlean Foot, NP Palliative Medicine Team  Team Phone # [phone]  For individual providers,  please see AMION

## 2023-11-17 NOTE — TOC Progression Note (Signed)
Transition of Care [facility]) - Progression Note    Patient Details  Name: Christina Morgan MRN: [id number] Date of Birth: Apr 06, 1933  Transition of Care [facility]) CM/SW Contact  [name], [place] Phone Number: 11/17/2023, 4:09 PM  Clinical Narrative:   CSW spoke with pts dtr, [name], upon request. [name] (dtr) and her brother are still undecided with dc disposition. Palliative is consulted. CSW will follow up with family regarding potentially option of pt dc to SNF.     Barriers to Discharge: Continued Medical Work up, SNF Pending bed offer  Expected Discharge Plan and Services       Living arrangements for the past 2 months: Assisted Living Facility                 DME Arranged: Dan Humphreys, Government social research officer                     Social Determinants of Health (SDOH) Interventions SDOH Screenings   Food Insecurity: No Food Insecurity (11/09/2023)  Housing: Low Risk  (11/09/2023)  Transportation Needs: No Transportation Needs (11/09/2023)  Utilities: Not At Risk (11/09/2023)  Financial Resource Strain: Low Risk  (09/27/2019)  Physical Activity: Inactive (09/27/2019)  Social Connections: Unknown (04/15/2022)   Received from [facility]  Stress: No Stress Concern Present (09/27/2019)  Tobacco Use: Low Risk  (11/08/2023)    Readmission Risk Interventions    03/11/2022   11:40 AM  Readmission Risk Prevention Plan  Transportation Screening Complete  Medication Review (RN Care Manager) Complete  PCP or Specialist appointment within 3-5 days of discharge Complete  HRI or Home Care Consult Complete  SW Recovery Care/Counseling Consult Complete  Palliative Care Screening Not Applicable  Skilled Nursing Facility Not Applicable

## 2023-11-18 DIAGNOSIS — Z515 Encounter for palliative care: Secondary | ICD-10-CM | POA: Diagnosis not present

## 2023-11-18 DIAGNOSIS — I4891 Unspecified atrial fibrillation: Secondary | ICD-10-CM | POA: Diagnosis not present

## 2023-11-18 DIAGNOSIS — N1832 Chronic kidney disease, stage 3b: Secondary | ICD-10-CM | POA: Diagnosis not present

## 2023-11-18 DIAGNOSIS — I5033 Acute on chronic diastolic (congestive) heart failure: Secondary | ICD-10-CM | POA: Diagnosis not present

## 2023-11-18 LAB — BASIC METABOLIC PANEL
Anion gap: 16 — ABNORMAL HIGH (ref 5–15)
Anion gap: 13 (ref 5–15)
BUN: 51 mg/dL — ABNORMAL HIGH (ref 8–23)
BUN: 54 mg/dL — ABNORMAL HIGH (ref 8–23)
CO2: 42 mmol/L — ABNORMAL HIGH (ref 22–32)
CO2: 42 mmol/L — ABNORMAL HIGH (ref 22–32)
Calcium: 9.5 mg/dL (ref 8.9–10.3)
Calcium: 9.4 mg/dL (ref 8.9–10.3)
Chloride: 93 mmol/L — ABNORMAL LOW (ref 98–111)
Chloride: 93 mmol/L — ABNORMAL LOW (ref 98–111)
Creatinine, Ser: 2.12 mg/dL — ABNORMAL HIGH (ref 0.44–1.00)
Creatinine, Ser: 2.33 mg/dL — ABNORMAL HIGH (ref 0.44–1.00)
GFR, Estimated: 22 mL/min — ABNORMAL LOW (ref >60)
GFR, Estimated: 20 mL/min — ABNORMAL LOW (ref >60)
Glucose, Bld: 102 mg/dL — ABNORMAL HIGH (ref 70–99)
Glucose, Bld: 245 mg/dL — ABNORMAL HIGH (ref 70–99)
Potassium: 3.3 mmol/L — ABNORMAL LOW (ref 3.5–5.1)
Potassium: 3 mmol/L — ABNORMAL LOW (ref 3.5–5.1)
Sodium: 151 mmol/L — ABNORMAL HIGH (ref 135–145)
Sodium: 148 mmol/L — ABNORMAL HIGH (ref 135–145)

## 2023-11-18 LAB — CBC
HCT: 39 % (ref 36.0–46.0)
Hemoglobin: 12 g/dL (ref 12.0–15.0)
MCH: 26.3 pg (ref 26.0–34.0)
MCHC: 30.8 g/dL (ref 30.0–36.0)
MCV: 85.3 fL (ref 80.0–100.0)
Platelets: 290 10*3/uL (ref 150–400)
RBC: 4.57 MIL/uL (ref 3.87–5.11)
RDW: 15.5 % (ref 11.5–15.5)
WBC: 7.1 10*3/uL (ref 4.0–10.5)
nRBC: 0 % (ref 0.0–0.2)

## 2023-11-18 MED ORDER — DEXTROSE 5 % IV SOLN: INTRAVENOUS | Status: AC

## 2023-11-18 NOTE — Plan of Care (Signed)
Pt incontinent of bowel. Pt cleaned and barrier cream applied after each episode of incontinence.

## 2023-11-18 NOTE — Progress Notes (Addendum)
PROGRESS NOTE    Christina Morgan  WUJ:[id number] DOB: [birth year] DOA: 11/08/2023 PCP: [facility]  89/F w hypertension, hyperlipidemia, paroxysmal atrial fibrillation, and CKD who presented with worsening lower extremity edema.dyspnea over the last 24 hrs prior to admission. Her diuretic dose was increased at SNF with no significant improvement in her symptoms. She was seen at [facility] ED, found volume overloaded and in atrial fibrillation with RVR, transferred to [facility] . In ED, Afib RVR positive JVD,positive lower extremity edema. BUN 21 cr 1,29 BNP 871 troponin 9 and 9 hgb 10,8 CXR w cardiomegaly, bilateral hilar vascular congestion and cephalization of the vasculature. Bilateral pleural effusions.  [place] lower extremities negative for deep vein thrombosis.  -placed on diltiazem infusion and IV furosemide.   11/29 patient with improvement in her symptoms with better rate controlled atrial fibrillation.  11/30 worsening renal function. She has developed acute metabolic encephalopathy.  12/1  she continues to have high 02 requirements.  12/02 on high flow nasal cannula, remains very weak and deconditioned -12/4 and 5 with some confusion, lethargy UA abnormal on 12/5, started antibiotics -12/7, uptrending creatinine and sodium, diuretics held, getting D5 water   Subjective: Tired, no complaints  Assessment and Plan:  Acute on chronic diastolic CHF  Pulm Hypertension -Echo with EF 55 to 60%, RV with preserved systolic function, RVSP 44,6 mmHg, LA with moderate dilatation, mild to moderate aortic valve re -had metolazone on 11/29  -Diuresed on IV Lasix, 6.6 L negative -Sodium and creatinine now trending up, hold further diuretics -prognosis not great, Cards suggestive conservative mgt ? Hospice and signed off -Palliative care team following  Acute hypoxemic respiratory failure due to acute cardiogenic pulmonary edema.  -As above, wean O2  Acute metabolic encephalopathy,   -Marginal improvement, abnormal UA concerning for UTI, started antibiotics yesterday, UA was reflexed to culture but unfortunately does not seem like culture was sent, too late now we will plan Abx for 5 days -Continue ceftriaxone, day 3 -Now with uptrending creatinine and sodium as well, hold further diuretics, add D5 W  PAF (paroxysmal atrial fibrillation) (HCC) In the past she has been on B blocker, flecainide and amiodarone, all discontinued due to patient converting to sinus rhythm with bradycardia. 04/2022 had direct current cardioversion, follow up 05/2022 patient was back in atrial fibrillation.  -Continue Cardizem CD, started metoprolol -apixaban continued -per Cards rate control strategy recommended  Chronic kidney disease, stage 3b (HCC) AKI, cardio renal syndrome.  -improving -Renal US with with increases echogenicity of the bilateral renal cortices, consistent with medical renal disease. Moderate left hydronephrosis.  -Continue torsemide  Essential hypertension, benign Continue diltiazem.   GERD (gastroesophageal reflux disease) Continue pantoprazole.  Dyslipidemia Continue statin therapy.   DVT prophylaxis: apixaban Code Status: DNR Family Communication: none present, will update fam Disposition Plan: To be determined  Consultants:    Procedures:   Antimicrobials:    Objective: Vitals:   11/17/23 1939 11/18/23 0043 11/18/23 0525 11/18/23 0700  BP: 123/65 129/67 130/69 134/65  Pulse: (!) 112 89 98 97  Resp: (!) 26 19  20   Temp: 99.7 F (37.6 C) 98.3 F (36.8 C)  98.3 F (36.8 C)  TempSrc: Rectal Axillary  Axillary  SpO2: (!) 87% 96%  99%  Weight:   65.9 kg   Height:        Intake/Output Summary (Last 24 hours) at 11/18/2023 1325 Last data filed at 11/18/2023 0600 Gross per 24 hour  Intake 100 ml  Output 651 ml  Net -551 ml   Filed Weights   11/16/23 0521 11/17/23 0649 11/18/23 0525  Weight: 69.8 kg 70 kg 65.9 kg    Examination:  Gen:  Elderly chronically ill female laying in bed, somnolent but easily arousable, answers questions HEENT: No JVD CVS: S1-S2, irregular rhythm Lungs: Decreased breath sounds at the bases Abdomen: Soft, nontender, bowel sounds present  Extremities: No edema Skin: no new rashes on exposed skin     Data Reviewed:   CBC: Recent Labs  Lab 11/15/23 0257 11/16/23 0234 11/17/23 0244 11/18/23 0227  WBC 7.8 7.8 8.2 7.1  HGB 11.6* 11.7* 11.6* 12.0  HCT 37.9 37.6 39.6 39.0  MCV 85.4 84.9 86.7 85.3  PLT 348 329 303 290   Basic Metabolic Panel: Recent Labs  Lab 11/12/23 0301 11/13/23 0345 11/14/23 0806 11/15/23 0257 11/16/23 0234 11/17/23 0244 11/18/23 0227  NA 144 145 146* 146* 146* 147* 151*  K 3.9 3.7 3.4* 3.8 4.5 3.7 3.3*  CL 102 104 98 95* 95* 94* 93*  CO2 29 31 36* 40* 37* 42* 42*  GLUCOSE 96 100* 103* 114* 109* 119* 102*  BUN 48* 49* 44* 43* 47* 50* 51*  CREATININE 2.53* 2.23* 1.97* 1.84* 1.87* 1.91* 2.12*  CALCIUM 9.1 8.8* 9.2 9.3 9.7 9.7 9.5  PHOS 7.2* 5.7*  --   --   --   --   --    GFR: Estimated Creatinine Clearance: 17.1 mL/min (A) (by C-G formula based on SCr of 2.12 mg/dL (H)). Liver Function Tests: Recent Labs  Lab 11/12/23 0301 11/13/23 0345 11/17/23 0244  AST  --   --  26  ALT  --   --  24  ALKPHOS  --   --  96  BILITOT  --   --  0.7  PROT  --   --  6.4*  ALBUMIN 3.1* 3.1* 3.3*   No results for input(s): "LIPASE", "AMYLASE" in the last 168 hours. Recent Labs  Lab 11/16/23 1536  AMMONIA 27   Coagulation Profile: No results for input(s): "INR", "PROTIME" in the last 168 hours. Cardiac Enzymes: No results for input(s): "CKTOTAL", "CKMB", "CKMBINDEX", "TROPONINI" in the last 168 hours. BNP (last 3 results) No results for input(s): "PROBNP" in the last 8760 hours. HbA1C: No results for input(s): "HGBA1C" in the last 72 hours. CBG: No results for input(s): "GLUCAP" in the last 168 hours. Lipid Profile: No results for input(s): "CHOL", "HDL",  "LDLCALC", "TRIG", "CHOLHDL", "LDLDIRECT" in the last 72 hours. Thyroid Function Tests: No results for input(s): "TSH", "T4TOTAL", "FREET4", "T3FREE", "THYROIDAB" in the last 72 hours. Anemia Panel: No results for input(s): "VITAMINB12", "FOLATE", "FERRITIN", "TIBC", "IRON", "RETICCTPCT" in the last 72 hours. Urine analysis:    Component Value Date/Time   COLORURINE YELLOW 11/16/2023 1210   APPEARANCEUR CLOUDY (A) 11/16/2023 1210   LABSPEC 1.016 11/16/2023 1210   PHURINE 8.0 11/16/2023 1210   GLUCOSEU NEGATIVE 11/16/2023 1210   HGBUR SMALL (A) 11/16/2023 1210   BILIRUBINUR NEGATIVE 11/16/2023 1210   KETONESUR NEGATIVE 11/16/2023 1210   PROTEINUR 30 (A) 11/16/2023 1210   UROBILINOGEN 0.2 06/19/2014 2131   NITRITE POSITIVE (A) 11/16/2023 1210   LEUKOCYTESUR TRACE (A) 11/16/2023 1210   Sepsis Labs: @LABRCNTIP (procalcitonin:4,lacticidven:4)  ) No results found for this or any previous visit (from the past 240 hour(s)).    Radiology Studies: No results found.   Scheduled Meds:  apixaban  2.5 mg Oral BID   atorvastatin  10 mg Oral QHS   diltiazem  300 mg  Oral Daily   feeding supplement  237 mL Oral TID BM   metoprolol tartrate  25 mg Oral BID   multivitamin with minerals  1 tablet Oral Daily   pantoprazole  40 mg Oral Daily   polyethylene glycol  17 g Oral BID   sodium chloride flush  3 mL Intravenous Q12H   Continuous Infusions:  cefTRIAXone (ROCEPHIN)  IV Stopped (11/17/23 1620)   dextrose 50 mL/hr at 11/18/23 1016     LOS: 10 days    Time spent:    Zannie Cove, MD [facility]   11/18/2023, 1:25 PM

## 2023-11-18 NOTE — Plan of Care (Signed)
  Problem: Health Behavior/Discharge Planning: Goal: Ability to manage health-related needs will improve Outcome: Progressing   Problem: Clinical Measurements: Goal: Ability to maintain clinical measurements within normal limits will improve Outcome: Progressing Goal: Will remain free from infection Outcome: Progressing Goal: Diagnostic test results will improve Outcome: Progressing Goal: Respiratory complications will improve Outcome: Progressing   Problem: Activity: Goal: Risk for activity intolerance will decrease Outcome: Progressing   Problem: Coping: Goal: Level of anxiety will decrease Outcome: Progressing   Problem: Elimination: Goal: Will not experience complications related to bowel motility Outcome: Progressing Goal: Will not experience complications related to urinary retention Outcome: Progressing   Problem: Pain Management: Goal: General experience of comfort will improve Outcome: Progressing   Problem: Safety: Goal: Ability to remain free from injury will improve Outcome: Progressing   Problem: Skin Integrity: Goal: Risk for impaired skin integrity will decrease Outcome: Progressing

## 2023-11-18 NOTE — Evaluation (Signed)
Clinical/Bedside Swallow Evaluation Patient Details  Name: Christina Morgan MRN: [id number] Date of Birth: 01-17-1933  Today's Date: 11/18/2023 Time: SLP Start Time (ACUTE ONLY): 1555 SLP Stop Time (ACUTE ONLY): 1630 SLP Time Calculation (min) (ACUTE ONLY): 35 min  Past Medical History:  Past Medical History:  Diagnosis Date   Cancer (HCC)    endometrial ca   Chronic diastolic CHF (congestive heart failure) (HCC)    Chronic edema    CKD (chronic kidney disease), stage III (HCC)    Dyslipidemia    Fracture of left humerus    History of cardiovascular stress test    Lexiscan Myoview (06/2014): No ischemia or scar, not gated, low risk   Hx of echocardiogram    Echo (05/02/14): EF 60% to 65%. No regional wall motion abnormalities. Mild AI, mildly dilated aortic root (37 mm), trivial MR, trivial TR   Hyperlipidemia    Hypertension    Lipoma of skin    multiple   Osteopenia    PAF (paroxysmal atrial fibrillation) (HCC)    failed DCCV 05/2014 >> Flecainide started >> DCCV 7/15 sucessful;  f/u ETT neg for pro-arrhythmia >> recurrent AF/AFL >> Flecainide inc to 100 bid with repeat DCCV 9/15   Vitamin D deficiency 09/25/2019   Past Surgical History:  Past Surgical History:  Procedure Laterality Date   CARDIOVERSION N/A 06/05/2014   Procedure: CARDIOVERSION;  Surgeon: Quintella Reichert, MD;  Location: [facility] ENDOSCOPY;  Service: Cardiovascular;  Laterality: N/A;   CARDIOVERSION N/A 07/11/2014   Procedure: CARDIOVERSION;  Surgeon: Quintella Reichert, MD;  Location: [facility]OSCOPY;  Service: Cardiovascular;  Laterality: N/A;   CARDIOVERSION N/A 09/04/2014   Procedure: CARDIOVERSION;  Surgeon: Quintella Reichert, MD;  Location: [facility] ENDOSCOPY;  Service: Cardiovascular;  Laterality: N/A;   CARDIOVERSION N/A 05/04/2022   Procedure: CARDIOVERSION;  Surgeon: Little Ishikawa, MD;  Location: [facility] ENDOSCOPY;  Service: Cardiovascular;  Laterality: N/A;   COLONOSCOPY  2008   COLONOSCOPY N/A 05/20/2015   Procedure:  COLONOSCOPY;  Surgeon: Bernette Redbird, MD;  Location: [facility] ENDOSCOPY;  Service: Endoscopy;  Laterality: N/A;   FEMUR IM NAIL Right 09/24/2019   Procedure: INTRAMEDULLARY (IM) NAIL FEMORAL;  Surgeon: Myrene Galas, MD;  Location: [facility] OR;  Service: Orthopedics;  Laterality: Right;   IR KYPHO EA ADDL LEVEL THORACIC OR LUMBAR  04/12/2019   IR KYPHO THORACIC WITH BONE BIOPSY  04/12/2019   ROBOTIC ASSISTED SUPRACERVICAL HYSTERECTOMY WITH BILATERAL SALPINGO OOPHERECTOMY  11/06/2012   and bilateral pelvic lymph node dissection   HPI:  [name] was admitted to the hospital with the working diagnosis of atrial fibrillation with RVR in the setting of heart failure decompensation. Pt encephalopathic as of 12/1 though volume status improved.    Assessment / Plan / Recommendation  Clinical Impression  Re-evaluation ordered due to RN reports of new onset coughing after intake of thin liquids. Patient upright in bed, lethargic and requires cueing for sustained attention to task but able to participate. She consumed thin liquids via cup with immediate cough response, suspected delay in swallow initiation, indicative of decreased airway protection. Trials of nectar thick liquid resulted in what appeared to be improved airway protection although delayed sublte cough (post intake of all pos) was present. Oral transit of regular texture solids moderately delayed. Recommend diet downgrade to dysphagia 2, nectar thick liquids and instrumental testing to evaluate swallowing physiology and determine least restrictive diet. Unlcear cause for decline in function although son and daughter in law suggest increased lethargy as compared  to previous date. If coughing continues with nectar thick liquid, please contact SLP. Will plan for MBS by 12/9. SLP Visit Diagnosis: Dysphagia, oropharyngeal phase (R13.12)    Aspiration Risk  Moderate aspiration risk    Diet Recommendation Dysphagia 2 (Fine chop);Nectar-thick liquid    Liquid  Administration via: Cup;No straw Medication Administration: Crushed with puree Supervision: Full supervision/cueing for compensatory strategies Compensations: Slow rate;Small sips/bites Postural Changes: Seated upright at 90 degrees    Other  Recommendations Oral Care Recommendations: Oral care BID Caregiver Recommendations: Avoid jello, ice cream, thin soups, popsicles;Remove water pitcher;Have oral suction available    Recommendations for follow up therapy are one component of a multi-disciplinary discharge planning process, led by the attending physician.  Recommendations may be updated based on patient status, additional functional criteria and insurance authorization.  Follow up Recommendations Skilled nursing-short term rehab (<3 hours/day)         Functional Status Assessment Patient has had a recent decline in their functional status and demonstrates the ability to make significant improvements in function in a reasonable and predictable amount of time.    Swallow Study   General HPI: [name] was admitted to the hospital with the working diagnosis of atrial fibrillation with RVR in the setting of heart failure decompensation. Pt encephalopathic as of 12/1 though volume status improved. Type of Study: Bedside Swallow Evaluation Previous Swallow Assessment: seen 11/13/23 by SLP who cleared patient for regular diet. Diet Prior to this Study: Regular;Thin liquids (Level 0) Temperature Spikes Noted: No Respiratory Status: Nasal cannula History of Recent Intubation: No Behavior/Cognition: Lethargic/Drowsy;Requires cueing Oral Cavity Assessment: Within Functional Limits Oral Care Completed by SLP: No Oral Cavity - Dentition: Adequate natural dentition Vision: Functional for self-feeding Self-Feeding Abilities: Able to feed self;Needs assist Patient Positioning: Upright in bed Baseline Vocal Quality: Low vocal intensity Volitional Cough: Weak;Congested Volitional Swallow: Able  to elicit    Oral/Motor/Sensory Function Overall Oral Motor/Sensory Function: Within functional limits   Ice Chips Ice chips: Within functional limits Presentation: Spoon   Thin Liquid Thin Liquid: Impaired Presentation: Cup;Self Fed Pharyngeal  Phase Impairments: Suspected delayed Swallow;Cough - Immediate    Nectar Thick Nectar Thick Liquid: Within functional limits Presentation: Cup;Self Fed   Honey Thick Honey Thick Liquid: Not tested   Puree Puree: Within functional limits Presentation: Spoon   Solid     Solid: Impaired Presentation: Self Fed Oral Phase Impairments: Impaired mastication Oral Phase Functional Implications: Prolonged oral transit     Aaliyana Fredericks MA, CCC-SLP  Chynah Orihuela Meryl 11/18/2023,4:38 PM

## 2023-11-19 ENCOUNTER — Inpatient Hospital Stay (HOSPITAL_COMMUNITY): Payer: Medicare Other

## 2023-11-19 DIAGNOSIS — N1832 Chronic kidney disease, stage 3b: Secondary | ICD-10-CM | POA: Diagnosis not present

## 2023-11-19 DIAGNOSIS — Z515 Encounter for palliative care: Secondary | ICD-10-CM | POA: Diagnosis not present

## 2023-11-19 DIAGNOSIS — I480 Paroxysmal atrial fibrillation: Secondary | ICD-10-CM | POA: Diagnosis not present

## 2023-11-19 DIAGNOSIS — I5033 Acute on chronic diastolic (congestive) heart failure: Secondary | ICD-10-CM | POA: Diagnosis not present

## 2023-11-19 LAB — BASIC METABOLIC PANEL
Anion gap: 13 (ref 5–15)
BUN: 47 mg/dL — ABNORMAL HIGH (ref 8–23)
CO2: 42 mmol/L — ABNORMAL HIGH (ref 22–32)
Calcium: 9 mg/dL (ref 8.9–10.3)
Chloride: 91 mmol/L — ABNORMAL LOW (ref 98–111)
Creatinine, Ser: 2.07 mg/dL — ABNORMAL HIGH (ref 0.44–1.00)
GFR, Estimated: 23 mL/min — ABNORMAL LOW (ref >60)
Glucose, Bld: 141 mg/dL — ABNORMAL HIGH (ref 70–99)
Potassium: 3.1 mmol/L — ABNORMAL LOW (ref 3.5–5.1)
Sodium: 146 mmol/L — ABNORMAL HIGH (ref 135–145)

## 2023-11-19 MED ORDER — POTASSIUM CHLORIDE CRYS ER 20 MEQ PO TBCR
40.0000 meq | EXTENDED_RELEASE_TABLET | Freq: Once | ORAL | Status: AC
  Administered 2023-11-19: 40 meq via ORAL
  Filled 2023-11-19: qty 2

## 2023-11-19 NOTE — Progress Notes (Signed)
Palliative Medicine Progress Note   Patient Name: Christina Morgan       Date: 11/19/2023 DOB: 05-22-1933  Age: 89 y.o. MRN#: [id number] Attending Physician: Zannie Cove, MD Primary Care Physician: Almetta Lovely, [facility] Admit Date: 11/08/2023   HPI/Patient Profile: 89 y.o. female  with past medical history of chronic diastolic CHF, paroxysmal atrial fibrillation, CKD stage IIIb, hypertension, and hyperlipidemia who presented to [facility] ED on 11/08/2023 with worsening lower extremity edema and dyspnea.  She was found to be in volume overload and in atrial fibrillation with RVR.  She was transferred to [facility] and admitted with acute on chronic CHF and acute hypoxemic respiratory failure. Hospital course complicated by acute metabolic encephalopathy, ongoing weakness, and concern for UTI.    Palliative Medicine was consulted for goals of care and medical decision making.   Subjective: Chart reviewed and discussed with Dr. Jomarie Longs. Patient's sodium is elevated and she has started D5W.   Bedside visit. Patient is somnolent/lethargic, does not arouse to voice or light touch on my assessment.   I later spoke with daughter by phone.  Provided updates on patient's current condition as noted above.  Discussed the limitations of medical interventions to prolong life when the body starts to feel.  Discussed current plan of care for D5W infusion and monitor sodium level.   Discussed with daughter plan for additional GOC tomorrow pending patient's clinical course.    Objective:  Physical Exam Vitals reviewed.  Constitutional:      General: She is not in acute distress.    Appearance: She is ill-appearing.     Comments: Frail  Pulmonary:     Effort: No respiratory distress.  Neurological:      Comments: somnolent              Palliative Medicine Assessment & Plan   Assessment: Principal Problem:   Acute on chronic diastolic CHF (congestive heart failure) (HCC) Active Problems:   Essential hypertension, benign   PAF (paroxysmal atrial fibrillation) (HCC)   Chronic kidney disease, stage 3b (HCC)   Dyslipidemia   GERD (gastroesophageal reflux disease)   Malnutrition of moderate degree    Recommendations/Plan: Continue current plan of care I will follow-up for additional GOC discussion tomorrow  Code Status: DNR/DNI   Prognosis:  Unable to determine  Discharge Planning: To Be Determined  Thank you for allowing the Palliative Medicine Team to assist in the care of this patient.  Time: 50 minutes  Detailed review of medical records (labs, imaging, vital signs), medically appropriate exam, discussed with treatment team, counseling and education to patient, family, & staff, documenting clinical information, medication management, coordination of care.   Merry Proud, NP   Please contact Palliative Medicine Team phone at [phone] for questions and concerns.  For individual providers, please see AMION.

## 2023-11-19 NOTE — Plan of Care (Signed)
  Problem: Education: Goal: Knowledge of General Education information will improve Description Including pain rating scale, medication(s)/side effects and non-pharmacologic comfort measures Outcome: Progressing   Problem: Health Behavior/Discharge Planning: Goal: Ability to manage health-related needs will improve Outcome: Progressing   

## 2023-11-19 NOTE — Procedures (Signed)
Modified Barium Swallow Study  Patient Details  Name: Christina Morgan MRN: [id number] Date of Birth: 12/30/1932  Today's Date: 11/19/2023  Modified Barium Swallow completed.  Full report located under Chart Review in the Imaging Section.  History of Present Illness Christina Morgan was admitted to the hospital with the working diagnosis of atrial fibrillation with RVR in the setting of heart failure decompensation. Pt encephalopathic as of 12/1 though volume status improved.   Clinical Impression Pt presents with a mild oropharyngeal dypshagia c/b prolonged oral phase with lingual discoordination, delayed swallow intiation, and reduced base of tongue retraction.  These deficits resulted in transient, very trace penetration of thin liquid during the swallow which was fully cleared.  Mild vallecula residue was cleared with spontaneous subsequent swallow.  Pt with poor head positioning with head to L side and difficulty holding head up at midline, but this did not results in decreased airway protection. There was prolonged oral phase with solids.  Pt had difficulty masticating soft solid trial and only accepted a portion of the bolus.  With simulated ground texture there was prolonged oral phase with disorganized A-P transit.  Discussed diet preference with pt would would acknowledges she is having some trouble moving food back to swallow and would prefer puree diet at this time.  She can advance back to a dysphagia 2 diet if desired, and hopefully will return to regular diet with overall improvement.  During pill simulation there was no penetration of liquid was or oral or pharyngeal stasis of tablet.  There was esophageal retention of tablet and barium contrast.  An additional bolus of puree did help to move tablet but it did not fully clear.  Contrast observed throughout thoracic esophagus with some backflow below the PES.  Further trials were not given to push tablet through esophagus, SLP opted to  allow contrast to clear with time.  Consider further esophageal asessment if indicated.     Recommend pureed diet with thin liquids.   Factors that may increase risk of adverse event in presence of aspiration Christina Morgan & Christina Morgan 2021):    Swallow Evaluation Recommendations Recommendations: PO diet PO Diet Recommendation: Dysphagia 1 (Pureed);Thin liquids (Level 0) Liquid Administration via: Cup;Straw Medication Administration: Whole meds with liquid Supervision: Staff to assist with self-feeding Swallowing strategies  : Slow rate;Small bites/sips Postural changes: Position pt fully upright for meals;Stay upright 30-60 min after meals Oral care recommendations: Oral care BID (2x/day) Recommended consults: Consider esophageal assessment (if warranted)      Christina Pleasure, MA, CCC-SLP Acute Rehabilitation Services Office: [phone] 11/19/2023,4:39 PM

## 2023-11-19 NOTE — Progress Notes (Signed)
PROGRESS NOTE    Christina Morgan  GEX:[id number] DOB: 1933/05/16 DOA: 11/08/2023 PCP: [facility]  89/F w hypertension, hyperlipidemia, paroxysmal atrial fibrillation, and CKD who presented with worsening lower extremity edema.dyspnea over the last 24 hrs prior to admission. Her diuretic dose was increased at SNF with no significant improvement in her symptoms. She was seen at [facility] ED, found volume overloaded and in atrial fibrillation with RVR, transferred to [facility] . In ED, Afib RVR positive JVD,positive lower extremity edema. BUN 21 cr 1,29 BNP 871 troponin 9 and 9 hgb 10,8 CXR w cardiomegaly, bilateral hilar vascular congestion and cephalization of the vasculature. Bilateral pleural effusions.  [place] lower extremities negative for deep vein thrombosis.  -placed on diltiazem infusion and IV furosemide.   11/29 patient with improvement in her symptoms with better rate controlled atrial fibrillation.  11/30 worsening renal function. She has developed acute metabolic encephalopathy.  12/1  she continues to have high 02 requirements.  12/02 on high flow nasal cannula, remains very weak and deconditioned -12/4 and 5 with some confusion, lethargy UA abnormal on 12/5, started antibiotics -12/7, uptrending creatinine and sodium, diuretics held, getting D5 water   Subjective: Tired, no complaints  Assessment and Plan:  Acute on chronic diastolic CHF  Pulm Hypertension -Echo with EF 55 to 60%, RV with preserved systolic function, RVSP 44,6 mmHg, LA with moderate dilatation, mild to moderate aortic valve re -had metolazone on 11/29  -Diuresed with IV Lasix, 7.6 L negative, further diuretics held yesterday with uptrending creatinine and sodium -prognosis not great, Cards suggestive conservative mgt ? Hospice and signed off -Palliative care team following, she might be appropriate for home with hospice services at ALF  Acute hypoxemic respiratory failure due to acute cardiogenic  pulmonary edema.  -As above, attempt to wean O2  Acute metabolic encephalopathy,  -Marginal improvement, abnormal UA concerning for UTI, started antibiotics yesterday, UA was reflexed to culture but unfortunately does not seem like culture was sent, too late now we will plan Abx for 5 days -Continue ceftriaxone, day 3 -Holding diuretics, sodium and creatinine better  PAF (paroxysmal atrial fibrillation) (HCC) In the past she has been on B blocker, flecainide and amiodarone, all discontinued due to patient converting to sinus rhythm with bradycardia. 04/2022 had direct current cardioversion, follow up 05/2022 patient was back in atrial fibrillation.  -Continue Cardizem CD, started metoprolol -apixaban continued -per Cards rate control strategy recommended  Chronic kidney disease, stage 3b (HCC) AKI, cardio renal syndrome.  -improving -Renal US with with increases echogenicity of the bilateral renal cortices, consistent with medical renal disease. Moderate left hydronephrosis.  -Continue torsemide  Essential hypertension, benign Continue diltiazem.   GERD (gastroesophageal reflux disease) Continue pantoprazole.  Dyslipidemia Continue statin therapy.   DVT prophylaxis: apixaban Code Status: DNR Family Communication: none present, will update fam Disposition Plan: Likely back to ALF with hospice services  Consultants:    Procedures:   Antimicrobials:    Objective: Vitals:   11/19/23 0440 11/19/23 0751 11/19/23 0800 11/19/23 1145  BP: 121/74 116/64  118/84  Pulse: (!) 115 89 90 79  Resp: 18 20 (!) 21 19  Temp: 97.6 F (36.4 C) (!) 97.3 F (36.3 C)  (!) 97.3 F (36.3 C)  TempSrc: Oral Axillary  Oral  SpO2: 96% 98% 100% 97%  Weight: 66.8 kg     Height:        Intake/Output Summary (Last 24 hours) at 11/19/2023 1157 Last data filed at 11/19/2023 0800 Gross per 24  hour  Intake 90 ml  Output 800 ml  Net -710 ml   Filed Weights   11/17/23 0649 11/18/23 0525  11/19/23 0440  Weight: 70 kg 65.9 kg 66.8 kg    Examination:  Gen: Elderly chronically ill female laying in bed, more awake and appropriate today HEENT: No JVD CVS: S1-S2, irregular rhythm Lungs: Decreased breath sounds at the bases Abdomen: Soft, nontender, bowel sounds present Extremities: No edema Skin: no new rashes on exposed skin     Data Reviewed:   CBC: Recent Labs  Lab 11/15/23 0257 11/16/23 0234 11/17/23 0244 11/18/23 0227  WBC 7.8 7.8 8.2 7.1  HGB 11.6* 11.7* 11.6* 12.0  HCT 37.9 37.6 39.6 39.0  MCV 85.4 84.9 86.7 85.3  PLT 348 329 303 290   Basic Metabolic Panel: Recent Labs  Lab 11/13/23 0345 11/14/23 0806 11/16/23 0234 11/17/23 0244 11/18/23 0227 11/18/23 1441 11/19/23 0256  NA 145   < > 146* 147* 151* 148* 146*  K 3.7   < > 4.5 3.7 3.3* 3.0* 3.1*  CL 104   < > 95* 94* 93* 93* 91*  CO2 31   < > 37* 42* 42* 42* 42*  GLUCOSE 100*   < > 109* 119* 102* 245* 141*  BUN 49*   < > 47* 50* 51* 54* 47*  CREATININE 2.23*   < > 1.87* 1.91* 2.12* 2.33* 2.07*  CALCIUM 8.8*   < > 9.7 9.7 9.5 9.4 9.0  PHOS 5.7*  --   --   --   --   --   --    < > = values in this interval not displayed.   GFR: Estimated Creatinine Clearance: 17.6 mL/min (A) (by C-G formula based on SCr of 2.07 mg/dL (H)). Liver Function Tests: Recent Labs  Lab 11/13/23 0345 11/17/23 0244  AST  --  26  ALT  --  24  ALKPHOS  --  96  BILITOT  --  0.7  PROT  --  6.4*  ALBUMIN 3.1* 3.3*   No results for input(s): "LIPASE", "AMYLASE" in the last 168 hours. Recent Labs  Lab 11/16/23 1536  AMMONIA 27   Coagulation Profile: No results for input(s): "INR", "PROTIME" in the last 168 hours. Cardiac Enzymes: No results for input(s): "CKTOTAL", "CKMB", "CKMBINDEX", "TROPONINI" in the last 168 hours. BNP (last 3 results) No results for input(s): "PROBNP" in the last 8760 hours. HbA1C: No results for input(s): "HGBA1C" in the last 72 hours. CBG: No results for input(s): "GLUCAP" in the  last 168 hours. Lipid Profile: No results for input(s): "CHOL", "HDL", "LDLCALC", "TRIG", "CHOLHDL", "LDLDIRECT" in the last 72 hours. Thyroid Function Tests: No results for input(s): "TSH", "T4TOTAL", "FREET4", "T3FREE", "THYROIDAB" in the last 72 hours. Anemia Panel: No results for input(s): "VITAMINB12", "FOLATE", "FERRITIN", "TIBC", "IRON", "RETICCTPCT" in the last 72 hours. Urine analysis:    Component Value Date/Time   COLORURINE YELLOW 11/16/2023 1210   APPEARANCEUR CLOUDY (A) 11/16/2023 1210   LABSPEC 1.016 11/16/2023 1210   PHURINE 8.0 11/16/2023 1210   GLUCOSEU NEGATIVE 11/16/2023 1210   HGBUR SMALL (A) 11/16/2023 1210   BILIRUBINUR NEGATIVE 11/16/2023 1210   KETONESUR NEGATIVE 11/16/2023 1210   PROTEINUR 30 (A) 11/16/2023 1210   UROBILINOGEN 0.2 06/19/2014 2131   NITRITE POSITIVE (A) 11/16/2023 1210   LEUKOCYTESUR TRACE (A) 11/16/2023 1210   Sepsis Labs: @LABRCNTIP (procalcitonin:4,lacticidven:4)  ) No results found for this or any previous visit (from the past 240 hour(s)).    Radiology Studies: No  results found.   Scheduled Meds:  apixaban  2.5 mg Oral BID   atorvastatin  10 mg Oral QHS   diltiazem  300 mg Oral Daily   feeding supplement  237 mL Oral TID BM   metoprolol tartrate  25 mg Oral BID   multivitamin with minerals  1 tablet Oral Daily   pantoprazole  40 mg Oral Daily   polyethylene glycol  17 g Oral BID   sodium chloride flush  3 mL Intravenous Q12H   Continuous Infusions:  cefTRIAXone (ROCEPHIN)  IV 1 g (11/18/23 1706)     LOS: 11 days    Time spent:    Zannie Cove, MD [facility]   11/19/2023, 11:57 AM

## 2023-11-20 DIAGNOSIS — I5033 Acute on chronic diastolic (congestive) heart failure: Secondary | ICD-10-CM | POA: Diagnosis not present

## 2023-11-20 LAB — CBC
HCT: 38.7 % (ref 36.0–46.0)
Hemoglobin: 11.7 g/dL — ABNORMAL LOW (ref 12.0–15.0)
MCH: 26 pg (ref 26.0–34.0)
MCHC: 30.2 g/dL (ref 30.0–36.0)
MCV: 86 fL (ref 80.0–100.0)
Platelets: 249 10*3/uL (ref 150–400)
RBC: 4.5 MIL/uL (ref 3.87–5.11)
RDW: 15.5 % (ref 11.5–15.5)
WBC: 6.6 10*3/uL (ref 4.0–10.5)
nRBC: 0 % (ref 0.0–0.2)

## 2023-11-20 LAB — BASIC METABOLIC PANEL
Anion gap: 11 (ref 5–15)
BUN: 44 mg/dL — ABNORMAL HIGH (ref 8–23)
CO2: 41 mmol/L — ABNORMAL HIGH (ref 22–32)
Calcium: 9.2 mg/dL (ref 8.9–10.3)
Chloride: 98 mmol/L (ref 98–111)
Creatinine, Ser: 2 mg/dL — ABNORMAL HIGH (ref 0.44–1.00)
GFR, Estimated: 24 mL/min — ABNORMAL LOW (ref >60)
Glucose, Bld: 117 mg/dL — ABNORMAL HIGH (ref 70–99)
Potassium: 3.5 mmol/L (ref 3.5–5.1)
Sodium: 150 mmol/L — ABNORMAL HIGH (ref 135–145)

## 2023-11-20 MED ORDER — SODIUM CHLORIDE 0.9 % IV SOLN
1.0000 g | INTRAVENOUS | Status: AC
  Administered 2023-11-20: 1 g via INTRAVENOUS
  Filled 2023-11-20: qty 10

## 2023-11-20 MED ORDER — DEXTROSE 5 % IV SOLN: INTRAVENOUS | Status: AC

## 2023-11-20 MED ORDER — ENSURE ENLIVE PO LIQD
237.0000 mL | ORAL | Status: DC
  Administered 2023-11-21: 237 mL via ORAL

## 2023-11-20 NOTE — Progress Notes (Signed)
Physical Therapy Treatment Patient Details Name: Christina Morgan MRN: [id number] DOB: 28-Oct-1933 Today's Date: 11/20/2023   History of Present Illness Patient is an 89 y.o. female who presents to [facility] hospital on 11/08/2023 for acute on chronic diastolic HF. UTI 12/5. PMH includes CKD, HLD, HTN, PAF, and endometrial cancer.    PT Comments  Pt more alert and engaged this date however remains confused and oriented to self only. Pt was able to complete step pvt transfer with maxAx2 this date with face to face transfer as pt was hyperfocused  on using her walker. Pt also kept trying to propel recliner despite extensive education that she was sitting in a recliner not a w/c and she just needed to sit and relax. Acute PT to cont to follow.    If plan is discharge home, recommend the following: Assistance with cooking/housework;Assist for transportation;Help with stairs or ramp for entrance;Two people to help with walking and/or transfers;A lot of help with bathing/dressing/bathroom;Direct supervision/assist for medications management;Direct supervision/assist for financial management   Can travel by private vehicle     No  Equipment Recommendations  BSC/3in1;Hoyer lift;Hospital bed    Recommendations for Other Services Rehab consult     Precautions / Restrictions Precautions Precautions: Fall Restrictions Weight Bearing Restrictions: No     Mobility  Bed Mobility Overal bed mobility: Needs Assistance Bed Mobility: Supine to Sit     Supine to sit: HOB elevated, Max assist Sit to supine: Mod assist, HOB elevated   General bed mobility comments: pt able to bring LEs over to EOB, verbal cues to reach across body with L UE for R hand rail, maxA to complete trunk elevation and scoot to EOB    Transfers Overall transfer level: Needs assistance Equipment used: 2 person hand held assist Transfers: Sit to/from Stand, Bed to chair/wheelchair/BSC Sit to Stand: Mod assist, +2 physical  assistance   Step pivot transfers: Max assist, +2 physical assistance       General transfer comment: pt stood up to RW however pt reports "this walker is too tall". Dan Humphreys was as low as it goes, assisted pt with face to face transfer for step pvt, pt with good power up, maxA directional verbal cues for step pvt, pt hyperfocused on using "My walker" however without comprehension she was in the hospital not in her apartment    Ambulation/Gait               General Gait Details: unable at this time, pt uses w/c primarily   Stairs             Wheelchair Mobility     Tilt Bed    Modified Rankin (Stroke Patients Only)       Balance Overall balance assessment: Needs assistance Sitting-balance support: Single extremity supported, No upper extremity supported, Feet supported (back supported in recliner) Sitting balance-Leahy Scale: Poor Sitting balance - Comments: pt with scoliosis with R lateral lean however head resting in L rotation. pt able to correct to command but unable to maintain midline. Pt sat EOB x 10 min requiring CGA to maxA assist pending level of fatigue   Standing balance support: Bilateral upper extremity supported, During functional activity, Reliant on assistive device for balance Standing balance-Leahy Scale: Poor Standing balance comment: reliant on UE support and external physical assistance                            Cognition Arousal: Alert Behavior During  Therapy: Flat affect Overall Cognitive Status: No family/caregiver present to determine baseline cognitive functioning Area of Impairment: Following commands, Safety/judgement, Problem solving, Attention, Orientation, Memory, Awareness                 Orientation Level: Disoriented to, Time, Situation, Place (Pt knows she is in [place] but initially stated she was at home.) Current Attention Level: Focused, Sustained Memory: Decreased short-term memory Following  Commands: Follows one step commands with increased time, Follows one step commands inconsistently Safety/Judgement: Decreased awareness of safety, Decreased awareness of deficits Awareness: Intellectual Problem Solving: Slow processing, Difficulty sequencing, Requires verbal cues, Decreased initiation, Requires tactile cues General Comments: Pt pleasant and able to follow all simple commands. pt confused with slow processing and impaired comprehension. Pt assisted to recliner, pt hyperfocused on "how am I going to pedal when my feet aren't on the floor." PT extensively explained she was in the hospital and it was a recliner not a w/c so she doesn't need to propel it        Exercises General Exercises - Lower Extremity Long Arc Quad: AROM, Both, 10 reps, Seated Hip Flexion/Marching: AROM, Both, 10 reps, Seated    General Comments General comments (skin integrity, edema, etc.): VSS on 1L02      Pertinent Vitals/Pain Pain Assessment Pain Assessment: No/denies pain    Home Living                          Prior Function            PT Goals (current goals can now be found in the care plan section) Acute Rehab PT Goals Patient Stated Goal: To get better PT Goal Formulation: With patient Time For Goal Achievement: 11/27/23 Potential to Achieve Goals: Fair Progress towards PT goals: Progressing toward goals    Frequency    Min 1X/week      PT Plan      Co-evaluation              AM-PAC PT "6 Clicks" Mobility   Outcome Measure  Help needed turning from your back to your side while in a flat bed without using bedrails?: A Lot Help needed moving from lying on your back to sitting on the side of a flat bed without using bedrails?: A Lot Help needed moving to and from a bed to a chair (including a wheelchair)?: A Lot Help needed standing up from a chair using your arms (e.g., wheelchair or bedside chair)?: A Lot Help needed to walk in hospital room?:  Total Help needed climbing 3-5 steps with a railing? : Total 6 Click Score: 10    End of Session Equipment Utilized During Treatment: Gait belt;Oxygen Activity Tolerance: Patient limited by lethargy Patient left: with call bell/phone within reach;in bed;with bed alarm set Nurse Communication: Mobility status PT Visit Diagnosis: Muscle weakness (generalized) (M62.81);Other abnormalities of gait and mobility (R26.89);Unsteadiness on feet (R26.81)     Time: 1610-9604 PT Time Calculation (min) (ACUTE ONLY): 25 min  Charges:    $Therapeutic Exercise: 8-22 mins $Therapeutic Activity: 8-22 mins PT General Charges $$ ACUTE PT VISIT: 1 Visit                     Lewis Shock, PT, DPT Acute Rehabilitation Services Secure chat preferred Office #: [phone]    Iona Hansen 11/20/2023, 1:14 PM

## 2023-11-20 NOTE — Progress Notes (Signed)
Palliative Medicine Progress Note   Patient Name: Christina Morgan       Date: 11/20/2023 DOB: 09-Feb-1933  Age: 89 y.o. MRN#: [id number] Attending Physician: Zannie Cove, MD Primary Care Physician: Almetta Lovely, [facility] Admit Date: 11/08/2023    HPI/Patient Profile: 89 y.o. female  with past medical history of chronic diastolic CHF, paroxysmal atrial fibrillation, CKD stage IIIb, hypertension, and hyperlipidemia who presented to [facility] ED on 11/08/2023 with worsening lower extremity edema and dyspnea.  She was found to be in volume overload and in atrial fibrillation with RVR.  She was transferred to [facility] and admitted with acute on chronic CHF and acute hypoxemic respiratory failure. Hospital course complicated by acute metabolic encephalopathy, ongoing weakness, and concern for UTI.    Palliative Medicine was consulted for goals of care and medical decision making.  Subjective: Chart reviewed. Sodium and renal function improved today.  Bedside visit. Patient is OOB to the recliner. She is more alert than yesterday, but confused. Daughter reports patient has eaten "some" today, but not very much. She did eat most of her magic cup at lunch.   Daughter confirm that she and son both agree that patient should return to ALF with the addition of hospice support. She is hopeful ALF will be able to accept patient back and accommodate the necessary level of care. She requests a hospital bed and oxygen.    Objective:  Physical Exam Vitals reviewed.  Constitutional:      General: She is not in acute distress.    Appearance: She is ill-appearing.  Pulmonary:     Effort: No respiratory distress.  Neurological:     Mental Status: She is alert. She is confused.     Motor: Weakness  present.             Palliative Medicine Assessment & Plan   Assessment: Principal Problem:   Acute on chronic diastolic CHF (congestive heart failure) (HCC) Active Problems:   Essential hypertension, benign   PAF (paroxysmal atrial fibrillation) (HCC)   Chronic kidney disease, stage 3b (HCC)   Dyslipidemia   GERD (gastroesophageal reflux disease)   Malnutrition of moderate degree    Recommendations/Plan: Continue current scope of care with goal for medical stabilization to the degree this is possible given patient's advanced age and multiple co-morbidities Family wants patient to  return to A:F with hospice support - TOC order placed PMT will continue to follow  I will return to service on 12/11. Please call team phone at [phone] if there are urgent needs. Daughter also knows to reach out if needed.    Code Status: DNR/DNI  Prognosis:  < 6 months  Discharge Planning: To Be Determined  Care plan was discussed with Dr. Jomarie Longs  Thank you for allowing the Palliative Medicine Team to assist in the care of this patient.   Time: 40 minutes   Merry Proud, NP   Please contact Palliative Medicine Team phone at [phone] for questions and concerns.  For individual providers, please see AMION.

## 2023-11-20 NOTE — Progress Notes (Signed)
Inpatient Rehab Admissions Coordinator:    Note plan to return to ALF with hospice. CIR will sign off.   Megan Salon, MS, CCC-SLP Rehab Admissions Coordinator  [phone] (celll) [phone] (office)

## 2023-11-20 NOTE — Progress Notes (Addendum)
Inpatient Rehab Admissions Coordinator:   CIR following. Await GOC discussion with palliative.   Megan Salon, MS, CCC-SLP Rehab Admissions Coordinator  [phone] (celll) [phone] (office)

## 2023-11-20 NOTE — Progress Notes (Signed)
Nutrition Follow-up  DOCUMENTATION CODES:   Non-severe (moderate) malnutrition in context of chronic illness  INTERVENTION:  Continue current diet as ordered. If pt desires - noted that SLP gave ok for pt to advance diet to DYS 2 if pt desires.  Magic cup TID with meals. Adjust flavor preferences. Each supplement provides 290 kcal and 9 grams of protein Decrease Ensure Enlive 1x/d, each supplement provides 350 kcal and 20 grams of protein. Discontinue mighty shakes, not drinking MVI with minerals daily  NUTRITION DIAGNOSIS:   Moderate Malnutrition related to chronic illness (CHF) as evidenced by mild muscle depletion, mild fat depletion. - remains applicable  GOAL:   Patient will meet greater than or equal to 90% of their needs - progressing, diet and supplements in place  MONITOR:   PO intake, Supplement acceptance  REASON FOR ASSESSMENT:   Consult Assessment of nutrition requirement/status  ASSESSMENT:  89 yo female admitted with decompensated HF with worsening cardiorenal syndrome. PMH includes HTN, HLD, PAF, CHF, CKD, endometrial cancer, osteopenia, chronic edema, vitamin D deficiency.  Pt resting in bed at the time of assessment. Sleeping, but did wake to name being called. Noted breakfast tray at bedside untouched as well as several supplements. Inquired about intake, pt reports she is eating "right much" but noted intake has been recorded as mostly 0% for several days.   Discussed with NT who has been caring for pt. States that pt will eat the magic cups as long as they are strawberry or orange sherbert. Will only drink a few sips of ensure. Does not consistently eat meals. States that after a bite or two she states she is full. Noted that pt is being followed by PMT who has had discussions with family over pt's prognosis.   Discussed poor PO with MD and PMT, pt will likely be discharging back to ALF with hospice once medically optimized. Family is aware of pt's poor PO  intake, no aggressive nutrition interventions would be appropriate at this time.   Admit weight: 75.3 kg Current weight: 67 kg   Average Meal Intake: 12/6-12/9: 0-25% intake x 8 recorded meals  Nutritionally Relevant Medications: Scheduled Meds:  atorvastatin  10 mg Oral QHS   Ensure Enlive  237 mL Oral TID BM   multivitamin with minerals  1 tablet Oral Daily   pantoprazole  40 mg Oral Daily   polyethylene glycol  17 g Oral BID   Continuous Infusions:  cefTRIAXone (ROCEPHIN)  IV     dextrose     PRN Meds ondansetron  Labs Reviewed: Na 150 BUN 44, creatinine 2.0  NUTRITION - FOCUSED PHYSICAL EXAM: Flowsheet Row Most Recent Value  Orbital Region Mild depletion  Upper Arm Region No depletion  Thoracic and Lumbar Region No depletion  Buccal Region Mild depletion  Temple Region Mild depletion  Clavicle Bone Region Mild depletion  Clavicle and Acromion Bone Region Mild depletion  Scapular Bone Region Unable to assess  Dorsal Hand Mild depletion  Patellar Region No depletion  Anterior Thigh Region No depletion  Posterior Calf Region Moderate depletion  Edema (RD Assessment) Mild  Hair Reviewed  Eyes Unable to assess  Mouth Unable to assess  Skin Reviewed  Nails Reviewed    Diet Order:   Diet Order             DIET - DYS 1 Fluid consistency: Thin  Diet effective now  EDUCATION NEEDS:  No education needs have been identified at this time  Skin:  Skin Assessment: Reviewed RN Assessment  Last BM:  12/7  Height:  Ht Readings from Last 1 Encounters:  11/08/23 5\' 4"  (1.626 m)    Weight:  Wt Readings from Last 1 Encounters:  11/20/23 67 kg    Ideal Body Weight:  54.5 kg  BMI:  Body mass index is 25.35 kg/m.  Estimated Nutritional Needs:  Kcal:  1500-1700 Protein:  75-85 gm Fluid:  1.5 L    Greig Castilla, RD, LDN Registered Dietitian II RD pager # available in AMION  After hours/weekend pager # available in [facility]

## 2023-11-20 NOTE — TOC Progression Note (Signed)
Transition of Care [facility]) - Progression Note    Patient Details  Name: Christina Morgan MRN: [id number] Date of Birth: Nov 02, 1933  Transition of Care [facility] CM/SW Contact  [name], [place] Phone Number: 11/20/2023, 2:37 PM  Clinical Narrative:  DC plan is for pt to go back to ALF with [facility] hospice. CSW reached out to [facility] about pts DME needs: O2 and Hospital bed.  [facility] did assessment today and stated pt can come back to ALF as long as hospital bed and O2 is delivered. At time of DC, ALF will need FL2 form and DC meds signed and sent to fax # [phone] (w/ heading ATTENTION EMAIL).     Barriers to Discharge: Continued Medical Work up, SNF Pending bed offer  Expected Discharge Plan and Services       Living arrangements for the past 2 months: Assisted Living Facility                 DME Arranged: Dan Humphreys, Government social research officer                     Social Determinants of Health (SDOH) Interventions SDOH Screenings   Food Insecurity: No Food Insecurity (11/09/2023)  Housing: Low Risk  (11/09/2023)  Transportation Needs: No Transportation Needs (11/09/2023)  Utilities: Not At Risk (11/09/2023)  Financial Resource Strain: Low Risk  (09/27/2019)  Physical Activity: Inactive (09/27/2019)  Social Connections: Unknown (04/15/2022)   Received from [facility]  Stress: No Stress Concern Present (09/27/2019)  Tobacco Use: Low Risk  (11/08/2023)    Readmission Risk Interventions    03/11/2022   11:40 AM  Readmission Risk Prevention Plan  Transportation Screening Complete  Medication Review (RN Care Manager) Complete  PCP or Specialist appointment within 3-5 days of discharge Complete  HRI or Home Care Consult Complete  SW Recovery Care/Counseling Consult Complete  Palliative Care Screening Not Applicable  Skilled Nursing Facility Not Applicable

## 2023-11-20 NOTE — Progress Notes (Signed)
[facility] Liaison Note  Received request from Transitions of Care Manager, for hospice services at ALF after discharge. Spoke with son, Minerva Areola to initiate education related to hospice philosophy, services, and team approach to care. Minerva Areola verbalized understanding of information given. Per discussion, the plan is for discharge home by PTAR/EMS tomorrow if we can arrange for DME delivery and Hospice nurse visit tomorrow evening.  .  DME needs discussed. Patient has no DME currently. Patient/family requests the following equipment for delivery: hospital bed, overbed table, hoyer lift, and oxygen. Minerva Areola is the family contact to arrange time of equipment delivery.  Please send signed and completed DNR home with patient/family. Please provide prescriptions at discharge as needed to ensure ongoing symptom management.   [facility] information and contact numbers given to [facility]. Above information shared with Transitions of Care Manager. Please call with any questions or concerns.   Thank you for the opportunity to participate in this patient's care.   Glenna Fellows BSN, RN, OCN [employer] [phone] or [phone]

## 2023-11-20 NOTE — Progress Notes (Signed)
PROGRESS NOTE    Christina Morgan  WGN:[id number] DOB: 1933/08/26 DOA: 11/08/2023 PCP: [facility]  89/F w hypertension, hyperlipidemia, paroxysmal atrial fibrillation, and CKD who presented with worsening lower extremity edema.dyspnea over the last 24 hrs prior to admission. Her diuretic dose was increased at SNF with no significant improvement in her symptoms. She was seen at [facility] ED, found volume overloaded and in atrial fibrillation with RVR, transferred to [facility] . In ED, Afib RVR positive JVD,positive lower extremity edema. BUN 21 cr 1,29 BNP 871 troponin 9 and 9 hgb 10,8 CXR w cardiomegaly, bilateral hilar vascular congestion and cephalization of the vasculature. Bilateral pleural effusions.  [place] lower extremities negative for deep vein thrombosis.  -placed on diltiazem infusion and IV furosemide.   11/29 patient with improvement in her symptoms with better rate controlled atrial fibrillation.  11/30 worsening renal function. She has developed acute metabolic encephalopathy.  12/1  she continues to have high 02 requirements.  12/02 on high flow nasal cannula, remains very weak and deconditioned -12/4 and 5 with some confusion, lethargy UA abnormal on 12/5, started antibiotics -12/7, uptrending creatinine and sodium, diuretics held, getting D5 water   Subjective: Tired, no complaints  Assessment and Plan:  Acute on chronic diastolic CHF  Pulm Hypertension -Echo with EF 55 to 60%, RV with preserved systolic function, RVSP 44,6 mmHg, LA with moderate dilatation, mild to moderate aortic valve re - diuresed with IV Lasix, 7.59 L negative, complicated by worsening creatinine and sodium  -Hold further diuretics today, D5W X 10 hours -prognosis not great, Cards suggestive conservative mgt ? Hospice and signed off -Palliative care team following, plan for home with hospice services?  Tomorrow  Acute hypoxemic respiratory failure due to acute cardiogenic pulmonary edema.   -As above, wean O2  Acute metabolic encephalopathy,  -Marginal improvement, abnormal UA concerning for UTI, started antibiotics yesterday, UA was reflexed to culture but unfortunately does not seem like culture was sent -Day 5 of ceftriaxone, discontinue after today's dose -Holding diuretics, sodium and creatinine better  PAF (paroxysmal atrial fibrillation) (HCC) In the past she has been on B blocker, flecainide and amiodarone, all discontinued due to patient converting to sinus rhythm with bradycardia. 04/2022 had direct current cardioversion, follow up 05/2022 patient was back in atrial fibrillation.  -Continue Cardizem CD, started metoprolol -apixaban continued -per Cards rate control strategy recommended  Chronic kidney disease, stage 3b (HCC) AKI, cardio renal syndrome.  -improving -Renal US with with increases echogenicity of the bilateral renal cortices, consistent with medical renal disease. Moderate left hydronephrosis.  -Continue torsemide  Essential hypertension, benign Continue diltiazem.   GERD (gastroesophageal reflux disease) Continue pantoprazole.  Dyslipidemia Continue statin therapy.   DVT prophylaxis: apixaban Code Status: DNR Family Communication: none present, will update fam Disposition Plan: Likely back to ALF with hospice services  Consultants:    Procedures:   Antimicrobials:    Objective: Vitals:   11/20/23 0200 11/20/23 0519 11/20/23 0811 11/20/23 1132  BP:  119/63 107/71 119/87  Pulse: 95 87    Resp: 20 20 20 18   Temp:  97.6 F (36.4 C) (!) 97.3 F (36.3 C)   TempSrc:  Oral Axillary   SpO2: 92%     Weight:  67 kg    Height:        Intake/Output Summary (Last 24 hours) at 11/20/2023 1144 Last data filed at 11/20/2023 0828 Gross per 24 hour  Intake 360 ml  Output 700 ml  Net -340 ml  Filed Weights   11/18/23 0525 11/19/23 0440 11/20/23 0519  Weight: 65.9 kg 66.8 kg 67 kg    Examination:  Gen: Elderly chronically ill  female laying in bed, awake alert, oriented to self and partly to place only HEENT: No JVD CVS: S1-S2, irregular rhythm Lungs: Decreased breath sounds at the bases Abdomen: Soft, nontender, bowel sounds present Extremities: No edema Skin: no new rashes on exposed skin     Data Reviewed:   CBC: Recent Labs  Lab 11/15/23 0257 11/16/23 0234 11/17/23 0244 11/18/23 0227 11/20/23 0254  WBC 7.8 7.8 8.2 7.1 6.6  HGB 11.6* 11.7* 11.6* 12.0 11.7*  HCT 37.9 37.6 39.6 39.0 38.7  MCV 85.4 84.9 86.7 85.3 86.0  PLT 348 329 303 290 249   Basic Metabolic Panel: Recent Labs  Lab 11/17/23 0244 11/18/23 0227 11/18/23 1441 11/19/23 0256 11/20/23 0254  NA 147* 151* 148* 146* 150*  K 3.7 3.3* 3.0* 3.1* 3.5  CL 94* 93* 93* 91* 98  CO2 42* 42* 42* 42* 41*  GLUCOSE 119* 102* 245* 141* 117*  BUN 50* 51* 54* 47* 44*  CREATININE 1.91* 2.12* 2.33* 2.07* 2.00*  CALCIUM 9.7 9.5 9.4 9.0 9.2   GFR: Estimated Creatinine Clearance: 18.3 mL/min (A) (by C-G formula based on SCr of 2 mg/dL (H)). Liver Function Tests: Recent Labs  Lab 11/17/23 0244  AST 26  ALT 24  ALKPHOS 96  BILITOT 0.7  PROT 6.4*  ALBUMIN 3.3*   No results for input(s): "LIPASE", "AMYLASE" in the last 168 hours. Recent Labs  Lab 11/16/23 1536  AMMONIA 27   Coagulation Profile: No results for input(s): "INR", "PROTIME" in the last 168 hours. Cardiac Enzymes: No results for input(s): "CKTOTAL", "CKMB", "CKMBINDEX", "TROPONINI" in the last 168 hours. BNP (last 3 results) No results for input(s): "PROBNP" in the last 8760 hours. HbA1C: No results for input(s): "HGBA1C" in the last 72 hours. CBG: No results for input(s): "GLUCAP" in the last 168 hours. Lipid Profile: No results for input(s): "CHOL", "HDL", "LDLCALC", "TRIG", "CHOLHDL", "LDLDIRECT" in the last 72 hours. Thyroid Function Tests: No results for input(s): "TSH", "T4TOTAL", "FREET4", "T3FREE", "THYROIDAB" in the last 72 hours. Anemia Panel: No results  for input(s): "VITAMINB12", "FOLATE", "FERRITIN", "TIBC", "IRON", "RETICCTPCT" in the last 72 hours. Urine analysis:    Component Value Date/Time   COLORURINE YELLOW 11/16/2023 1210   APPEARANCEUR CLOUDY (A) 11/16/2023 1210   LABSPEC 1.016 11/16/2023 1210   PHURINE 8.0 11/16/2023 1210   GLUCOSEU NEGATIVE 11/16/2023 1210   HGBUR SMALL (A) 11/16/2023 1210   BILIRUBINUR NEGATIVE 11/16/2023 1210   KETONESUR NEGATIVE 11/16/2023 1210   PROTEINUR 30 (A) 11/16/2023 1210   UROBILINOGEN 0.2 06/19/2014 2131   NITRITE POSITIVE (A) 11/16/2023 1210   LEUKOCYTESUR TRACE (A) 11/16/2023 1210   Sepsis Labs: @LABRCNTIP (procalcitonin:4,lacticidven:4)  ) No results found for this or any previous visit (from the past 240 hour(s)).    Radiology Studies: DG Swallowing Func-Speech Pathology  Result Date: 11/19/2023 Table formatting from the original result was not included. Modified Barium Swallow Study Patient Details Name: TEPHANIE ARCH MRN: [id number] Date of Birth: 1932/12/25 Today's Date: 11/19/2023 HPI/PMH: HPI: Christina Morgan was admitted to the hospital with the working diagnosis of atrial fibrillation with RVR in the setting of heart failure decompensation. Pt encephalopathic as of 12/1 though volume status improved. Clinical Impression: Clinical Impression: Pt presents with a mild oropharyngeal dypshagia c/b prolonged oral phase with lingual discoordination, delayed swallow intiation, and reduced base of tongue retraction.  These deficits resulted in transient, very trace penetration of thin liquid during the swallow which was fully cleared.  Mild vallecula residue was cleared with spontaneous subsequent swallow.  Pt with poor head positioning with head to L side and difficulty holding head up at midline, but this did not results in decreased airway protection. There was prolonged oral phase with solids.  Pt had difficulty masticating soft solid trial and only accepted a portion of the bolus.  With  simulated ground texture there was prolonged oral phase with disorganized A-P transit.  Discussed diet preference with pt would would acknowledges she is having some trouble moving food back to swallow and would prefer puree diet at this time.  She can advance back to a dysphagia 2 diet if desired, and hopefully will return to regular diet with overall improvement.  During pill simulation there was no penetration of liquid was or oral or pharyngeal stasis of tablet.  There was esophageal retention of tablet and barium contrast.  An additional bolus of puree did help to move tablet but it did not fully clear.  Contrast observed throughout thoracic esophagus with some backflow below the PES.  Further trials were not give to push tablet through esophagus, SLP opted to allow contrast to clear with time.  Consider further esophageal asessment if indicated.   Recommend pureed diet with thin liquids. Factors that may increase risk of adverse event in presence of aspiration Christina Morgan & Clearance Coots 2021): No data recorded Recommendations/Plan: Swallowing Evaluation Recommendations Swallowing Evaluation Recommendations Recommendations: PO diet PO Diet Recommendation: Dysphagia 1 (Pureed); Thin liquids (Level 0) Liquid Administration via: Cup; Straw Medication Administration: Whole meds with liquid Supervision: Staff to assist with self-feeding Swallowing strategies  : Slow rate; Small bites/sips Postural changes: Position pt fully upright for meals; Stay upright 30-60 min after meals Oral care recommendations: Oral care BID (2x/day) Recommended consults: Consider esophageal assessment (if warranted) Caregiver Recommendations: Avoid jello, ice cream, thin soups, popsicles; Remove water pitcher; Have oral suction available Treatment Plan Treatment Plan Treatment recommendations: Therapy as outlined in treatment plan below Follow-up recommendations: Follow physicians's recommendations for discharge plan and follow up therapies  Functional status assessment: Patient has had a recent decline in their functional status and demonstrates the ability to make significant improvements in function in a reasonable and predictable amount of time. Treatment frequency: Min 2x/week Treatment duration: 2 weeks Interventions: Aspiration precaution training; Diet toleration management by SLP; Trials of upgraded texture/liquids Recommendations Recommendations for follow up therapy are one component of a multi-disciplinary discharge planning process, led by the attending physician.  Recommendations may be updated based on patient status, additional functional criteria and insurance authorization. Assessment: Orofacial Exam: Orofacial Exam Oral Cavity - Dentition: Adequate natural dentition Orofacial Anatomy: WFL Oral Motor/Sensory Function: -- (See clinical assessment) Anatomy: Anatomy: -- (Poor positioning.  Difficulty holding head upright) Boluses Administered: Boluses Administered Boluses Administered: Thin liquids (Level 0); Puree; Solid  Oral Impairment Domain: Oral Impairment Domain Lip Closure: No labial escape Tongue control during bolus hold: Cohesive bolus between tongue to palatal seal Bolus preparation/mastication: Slow prolonged chewing/mashing with complete recollection Bolus transport/lingual motion: Repetitive/disorganized tongue motion Oral residue: Trace residue lining oral structures Location of oral residue : Tongue Initiation of pharyngeal swallow : Valleculae  Pharyngeal Impairment Domain: Pharyngeal Impairment Domain Soft palate elevation: No bolus between soft palate (SP)/pharyngeal wall (PW) Laryngeal elevation: Partial superior movement of thyroid cartilage/partial approximation of arytenoids to epiglottic petiole Anterior hyoid excursion: Partial anterior movement Epiglottic movement: Complete inversion Laryngeal vestibule  closure: Complete, no air/contrast in laryngeal vestibule Pharyngeal stripping wave : Present - diminished  Pharyngeal contraction (A/P view only): N/A Pharyngoesophageal segment opening: Partial distention/partial duration, partial obstruction of flow Tongue base retraction: Trace column of contrast or air between tongue base and PPW Pharyngeal residue: Trace residue within or on pharyngeal structures Location of pharyngeal residue: Valleculae  Esophageal Impairment Domain: Esophageal Impairment Domain Esophageal clearance upright position: Esophageal retention with retrograde flow below pharyngoesophageal segment (PES) Pill: Pill Consistency administered: Thin liquids (Level 0) Penetration/Aspiration Scale Score: Penetration/Aspiration Scale Score 1.  Material does not enter airway: Puree; Pill; Solid; Thin liquids (Level 0) 2.  Material enters airway, remains ABOVE vocal cords then ejected out: Thin liquids (Level 0) Compensatory Strategies: Compensatory Strategies Compensatory strategies: No   General Information: Caregiver present: No  Diet Prior to this Study: Dysphagia 2 (finely chopped); Mildly thick liquids (Level 2, nectar thick)   No data recorded  Respiratory Status: Tachypneic (increased RR per monitor, but did not appear to be working too hard to breathe)   Supplemental O2: Nasal cannula   No data recorded Behavior/Cognition: Alert; Cooperative No data recorded Baseline vocal quality/speech: Normal No data recorded Volitional Swallow: Able to elicit Exam Limitations: No limitations Goal Planning: Prognosis for improved oropharyngeal function: Good No data recorded No data recorded No data recorded Consulted and agree with results and recommendations: Patient; Physician; Nurse Pain: Pain Assessment Pain Assessment: No/denies pain Breathing: 0 Negative Vocalization: 0 Facial Expression: 0 Body Language: 0 Consolability: 0 PAINAD Score: 0 End of Session: Start Time:SLP Start Time (ACUTE ONLY): 1541 Stop Time: SLP Stop Time (ACUTE ONLY): 1555 Time Calculation:SLP Time Calculation (min) (ACUTE ONLY): 14 min  Charges: SLP Evaluations $ SLP Speech Visit: 1 Visit SLP Evaluations $BSS Swallow: 1 Procedure $MBS Swallow: 1 Procedure SLP visit diagnosis: SLP Visit Diagnosis: Dysphagia, oropharyngeal phase (R13.12) Past Medical History: Past Medical History: Diagnosis Date  Cancer (HCC)   endometrial ca  Chronic diastolic CHF (congestive heart failure) (HCC)   Chronic edema   CKD (chronic kidney disease), stage III (HCC)   Dyslipidemia   Fracture of left humerus   History of cardiovascular stress test   Lexiscan Myoview (06/2014): No ischemia or scar, not gated, low risk  Hx of echocardiogram   Echo (05/02/14): EF 60% to 65%. No regional wall motion abnormalities. Mild AI, mildly dilated aortic root (37 mm), trivial MR, trivial TR  Hyperlipidemia   Hypertension   Lipoma of skin   multiple  Osteopenia   PAF (paroxysmal atrial fibrillation) (HCC)   failed DCCV 05/2014 >> Flecainide started >> DCCV 7/15 sucessful;  f/u ETT neg for pro-arrhythmia >> recurrent AF/AFL >> Flecainide inc to 100 bid with repeat DCCV 9/15  Vitamin D deficiency 09/25/2019 Past Surgical History: Past Surgical History: Procedure Laterality Date  CARDIOVERSION N/A 06/05/2014  Procedure: CARDIOVERSION;  Surgeon: Quintella Reichert, MD;  Location: [facility] ENDOSCOPY;  Service: Cardiovascular;  Laterality: N/A;  CARDIOVERSION N/A 07/11/2014  Procedure: CARDIOVERSION;  Surgeon: Quintella Reichert, MD;  Location: [facility] ENDOSCOPY;  Service: Cardiovascular;  Laterality: N/A;  CARDIOVERSION N/A 09/04/2014  Procedure: CARDIOVERSION;  Surgeon: Quintella Reichert, MD;  Location: [facility] ENDOSCOPY;  Service: Cardiovascular;  Laterality: N/A;  CARDIOVERSION N/A 05/04/2022  Procedure: CARDIOVERSION;  Surgeon: Little Ishikawa, MD;  Location: [facility] ENDOSCOPY;  Service: Cardiovascular;  Laterality: N/A;  COLONOSCOPY  2008  COLONOSCOPY N/A 05/20/2015  Procedure: COLONOSCOPY;  Surgeon: Bernette Redbird, MD;  Location: [facility] ENDOSCOPY;  Service: Endoscopy;  Laterality: N/A;  FEMUR IM  NAIL Right 09/24/2019   Procedure: INTRAMEDULLARY (IM) NAIL FEMORAL;  Surgeon: Myrene Galas, MD;  Location: [facility] OR;  Service: Orthopedics;  Laterality: Right;  IR KYPHO EA ADDL LEVEL THORACIC OR LUMBAR  04/12/2019  IR KYPHO THORACIC WITH BONE BIOPSY  04/12/2019  ROBOTIC ASSISTED SUPRACERVICAL HYSTERECTOMY WITH BILATERAL SALPINGO OOPHERECTOMY  11/06/2012  and bilateral pelvic lymph node dissection Kerrie Pleasure, MA, CCC-SLP Acute Rehabilitation Services Office: [phone] 11/19/2023, 4:40 PM    Scheduled Meds:  apixaban  2.5 mg Oral BID   atorvastatin  10 mg Oral QHS   diltiazem  300 mg Oral Daily   feeding supplement  237 mL Oral TID BM   metoprolol tartrate  25 mg Oral BID   multivitamin with minerals  1 tablet Oral Daily   pantoprazole  40 mg Oral Daily   polyethylene glycol  17 g Oral BID   sodium chloride flush  3 mL Intravenous Q12H   Continuous Infusions:  cefTRIAXone (ROCEPHIN)  IV     dextrose       LOS: 12 days    Time spent:    Zannie Cove, MD [facility]   11/20/2023, 11:44 AM

## 2023-11-21 ENCOUNTER — Other Ambulatory Visit (HOSPITAL_COMMUNITY): Payer: Self-pay

## 2023-11-21 DIAGNOSIS — I5033 Acute on chronic diastolic (congestive) heart failure: Secondary | ICD-10-CM | POA: Diagnosis not present

## 2023-11-21 LAB — BASIC METABOLIC PANEL
Anion gap: 12 (ref 5–15)
BUN: 45 mg/dL — ABNORMAL HIGH (ref 8–23)
CO2: 37 mmol/L — ABNORMAL HIGH (ref 22–32)
Calcium: 8.9 mg/dL (ref 8.9–10.3)
Chloride: 96 mmol/L — ABNORMAL LOW (ref 98–111)
Creatinine, Ser: 1.87 mg/dL — ABNORMAL HIGH (ref 0.44–1.00)
GFR, Estimated: 26 mL/min — ABNORMAL LOW (ref >60)
Glucose, Bld: 120 mg/dL — ABNORMAL HIGH (ref 70–99)
Potassium: 4 mmol/L (ref 3.5–5.1)
Sodium: 145 mmol/L (ref 135–145)

## 2023-11-21 LAB — MAGNESIUM: Magnesium: 2.5 mg/dL — ABNORMAL HIGH (ref 1.7–2.4)

## 2023-11-21 MED ORDER — METOPROLOL TARTRATE 25 MG PO TABS: 25.0000 mg | ORAL_TABLET | Freq: Two times a day (BID) | ORAL | 1 refills | Status: DC

## 2023-11-21 MED ORDER — FUROSEMIDE 40 MG PO TABS
40.0000 mg | ORAL_TABLET | Freq: Every day | ORAL | Status: DC
  Administered 2023-11-21 – 2023-11-22 (×2): 40 mg via ORAL
  Filled 2023-11-21 (×2): qty 1

## 2023-11-21 MED ORDER — POTASSIUM CHLORIDE CRYS ER 20 MEQ PO TBCR
20.0000 meq | EXTENDED_RELEASE_TABLET | ORAL | 0 refills | Status: AC
  Filled 2023-11-21: qty 30, 60d supply, fill #0

## 2023-11-21 MED ORDER — METOPROLOL TARTRATE 25 MG PO TABS
25.0000 mg | ORAL_TABLET | Freq: Two times a day (BID) | ORAL | 1 refills | Status: AC
  Filled 2023-11-21: qty 60, 30d supply, fill #0

## 2023-11-21 MED ORDER — DILTIAZEM HCL ER COATED BEADS 300 MG PO CP24
300.0000 mg | ORAL_CAPSULE | Freq: Every day | ORAL | 1 refills | Status: AC
  Filled 2023-11-21: qty 30, 30d supply, fill #0

## 2023-11-21 MED ORDER — FUROSEMIDE 40 MG PO TABS
40.0000 mg | ORAL_TABLET | ORAL | 0 refills | Status: AC
  Filled 2023-11-21: qty 30, 60d supply, fill #0

## 2023-11-21 MED ORDER — APIXABAN 2.5 MG PO TABS
2.5000 mg | ORAL_TABLET | Freq: Two times a day (BID) | ORAL | 3 refills | Status: DC
  Filled 2023-11-21: qty 60, 30d supply, fill #0

## 2023-11-21 MED ORDER — DILTIAZEM HCL ER COATED BEADS 300 MG PO CP24: 300.0000 mg | ORAL_CAPSULE | Freq: Every day | ORAL | 1 refills | Status: DC

## 2023-11-21 NOTE — Progress Notes (Signed)
Mobility Specialist Progress Note:    11/21/23 1112  Mobility  Activity Transferred from bed to chair  Level of Assistance +2 (takes two people) (ModA)  Assistive Device Stedy  Activity Response Tolerated well  Mobility Referral Yes  Mobility visit 1 Mobility  Mobility Specialist Start Time (ACUTE ONLY) 1050  Mobility Specialist Stop Time (ACUTE ONLY) 1105  Mobility Specialist Time Calculation (min) (ACUTE ONLY) 15 min   Pt received in bed agreeable to mobility. Pt was able to get to EOB with ModA, for STS pt needed ModA +2. Was able to transfer to the chair w/ the stedy w/o fault. No c/o throughout session. Call bell and personal belongings in reach. NT in room.  Thompson Grayer Mobility Specialist  Please contact vis Secure Chat or  Rehab Office [phone]

## 2023-11-21 NOTE — Progress Notes (Signed)
Speech Language Pathology Treatment: Dysphagia  Patient Details Name: Christina Morgan MRN: [id number] DOB: 03-07-1933 Today's Date: 11/21/2023 Time: 4098-1191 SLP Time Calculation (min) (ACUTE ONLY): 10 min  Assessment / Plan / Recommendation Clinical Impression  Pt sitting upright in chair with no acute concerns with swallowing. She endorses preference to trial upgraded textures as she feels she is getting better. Observed pt with trials of thin liquids and Dys 3 solids without overt s/s of dysphagia or aspiration. She required Min verbal cues for impulsivity. Recommend upgrading diet to Dys 2 texture solids and continuing thin liquids. SLP will f/u to assess ability for further upgrade as clinically indicated.   HPI HPI: Christina Morgan was admitted to the hospital with the working diagnosis of atrial fibrillation with RVR in the setting of heart failure decompensation. Pt encephalopathic as of 12/1 though volume status improved.      SLP Plan  Continue with current plan of care      Recommendations for follow up therapy are one component of a multi-disciplinary discharge planning process, led by the attending physician.  Recommendations may be updated based on patient status, additional functional criteria and insurance authorization.    Recommendations  Diet recommendations: Dysphagia 2 (fine chop);Thin liquid Liquids provided via: Cup;Straw Medication Administration: Crushed with puree Supervision: Patient able to self feed;Full supervision/cueing for compensatory strategies Compensations: Slow rate;Small sips/bites Postural Changes and/or Swallow Maneuvers: Seated upright 90 degrees;Upright 30-60 min after meal                  Oral care BID   Frequent or constant Supervision/Assistance Dysphagia, oropharyngeal phase (R13.12)     Continue with current plan of care     Gwynneth Aliment, M.A., CF-SLP Speech Language Pathology, Acute Rehabilitation Services  Secure  Chat preferred [phone]   11/21/2023, 2:05 PM

## 2023-11-21 NOTE — TOC Progression Note (Signed)
Transition of Care [facility]) - Progression Note    Patient Details  Name: Christina Morgan MRN: [id number] Date of Birth: [birth year]  [facility] CM/SW Contact  [name], [place] Phone Number: 11/21/2023, 4:25 PM  Clinical Narrative:   DC cancelled per Attending due to family concerns w/ dc to ALF late and missing hospice appointment.   Per [facility], hospice can see pt tomorrow at [place]  CSW called [facility] to cancel transport. CSW contacted facility and left VM with update. CSW spoke with pts family to discuss DC for tomorrow. CSW updated nurses station.   Expected Discharge Plan: Assisted Living Barriers to Discharge: Barriers Resolved  Expected Discharge Plan and Services       Living arrangements for the past 2 months: Assisted Living Facility Expected Discharge Date: 11/22/23               DME Arranged: Hospital bed, Oxygen DME Agency: Other - Comment ([facility] set up DME)                   Social Determinants of Health (SDOH) Interventions SDOH Screenings   Food Insecurity: No Food Insecurity (11/09/2023)  Housing: Low Risk  (11/09/2023)  Transportation Needs: No Transportation Needs (11/09/2023)  Utilities: Not At Risk (11/09/2023)  Financial Resource Strain: Low Risk  (09/27/2019)  Physical Activity: Inactive (09/27/2019)  Social Connections: Unknown (04/15/2022)   Received from [facility]  Stress: No Stress Concern Present (09/27/2019)  Tobacco Use: Low Risk  (11/08/2023)    Readmission Risk Interventions    03/11/2022   11:40 AM  Readmission Risk Prevention Plan  Transportation Screening Complete  Medication Review (RN Care Manager) Complete  PCP or Specialist appointment within 3-5 days of discharge Complete  HRI or Home Care Consult Complete  SW Recovery Care/Counseling Consult Complete  Palliative Care Screening Not Applicable  Skilled Nursing Facility Not Applicable

## 2023-11-21 NOTE — [place]2 (Signed)
[place] MEDICAID FL2 LEVEL OF CARE FORM     IDENTIFICATION  Patient Name: Christina Morgan Birthdate: July 11, 1933 Sex: female Admission Date (Current Location): 11/08/2023  [facility] and [place] Number:  Producer, television/film/video and Address:  [facility] [place], [address], [place], [place] [address]      Provider Number: [id number]  Attending Physician Name and Address:  Zannie Cove, MD  Relative Name and Phone Number:       Current Level of Care: Hospital Recommended Level of Care: Assisted Living Facility Prior Approval Number:    Date Approved/Denied:   PASRR Number: [id number]  Discharge Plan: Other (Comment) [facility] -AL[facility])    Current Diagnoses: Patient Active Problem List   Diagnosis Date Noted   Malnutrition of moderate degree 11/14/2023   Pressure injury of deep tissue of left heel 03/11/2022   Acute metabolic encephalopathy 03/09/2022   Acute lower UTI 03/09/2022   Thrombocytopenia (HCC) 03/09/2022   Elevated transaminase level 03/09/2022   Dyslipidemia    History of anemia due to chronic kidney disease    GERD (gastroesophageal reflux disease)    Fluid overload 03/06/2022   AKI superimposed on stage 3b chronic kidney disease (HCC) 01/02/2022   Symptomatic bradycardia 11/29/2021   Bacteremia due to Gram-positive bacteria 02/13/2020   Acute encephalopathy 02/11/2020   Cervical spine fracture (HCC) 02/11/2020   CAP (community acquired pneumonia) 02/11/2020   Chronic diastolic congestive heart failure (HCC)    Chronic kidney disease, stage 3b (HCC)    PAF (paroxysmal atrial fibrillation) (HCC)    Closed displaced intertrochanteric fracture of right femur (HCC)    Fall 09/23/2019   SOB (shortness of breath) 01/04/2019   Bilateral lower leg cellulitis 03/30/2018   Acute on chronic diastolic CHF (congestive heart failure) (HCC) 03/30/2018   Edema of extremities 08/19/2014   Essential hypertension, benign 05/15/2014   Mixed  hyperlipidemia 05/15/2014   Persistent atrial fibrillation (HCC) 05/15/2014   Endometrial ca (HCC) 12/28/2012    Orientation RESPIRATION BLADDER Height & Weight     Self, Place  O2 (1L [place]) Continent, External catheter Weight: 152 lb 1.9 oz (69 kg) Height:  5\' 4"  (162.6 cm)  BEHAVIORAL SYMPTOMS/MOOD NEUROLOGICAL BOWEL NUTRITION STATUS      Continent Mechanical soft diet, meds crushed in applesauce, Low-salt, heart healthy   AMBULATORY STATUS COMMUNICATION OF NEEDS Skin   Extensive Assist (W/c bound) Verbally Normal                       Personal Care Assistance Level of Assistance  Bathing, Dressing, Feeding Bathing Assistance: Maximum assistance Feeding assistance: Limited assistance Dressing Assistance: Maximum assistance     Functional Limitations Info  Sight, Speech, Hearing Sight Info: Impaired (glasses) Hearing Info: Adequate Speech Info: Adequate    SPECIAL CARE FACTORS FREQUENCY  PT (By licensed PT), OT (By licensed OT)     PT Frequency: 5x week OT Frequency: 5x week            Contractures Contractures Info: Not present    Additional Factors Info  Code Status, Allergies Code Status Info: DNR limited Allergies Info: Penicillins           Discharge Medications:(11/21/2023):   TAKE these medications     Acetaminophen 8 Hour 650 MG CR tablet Generic drug: acetaminophen Take 650 mg by mouth every 8 (eight) hours as needed for fever or pain.    apixaban 5 MG Tabs tablet Commonly known as: ELIQUIS  Take 1 tablet (5 mg total) by mouth 2 (two) times daily.    ascorbic acid 500 MG tablet Commonly known as: VITAMIN C Take 1 tablet (500 mg total) by mouth daily.    atorvastatin 10 MG tablet Commonly known as: LIPITOR Take 1 tablet (10 mg total) by mouth at bedtime.    DAILY VITE PO Take 1 tablet by mouth daily.    diltiazem 300 MG 24 hr capsule Commonly known as: CARDIZEM CD Take 1 capsule (300 mg total) by mouth daily.    furosemide 40 MG  tablet Commonly known as: Lasix Take 1 tablet (40 mg total) by mouth every other day.    letrozole 2.5 MG tablet Commonly known as: FEMARA Take 2.5 mg by mouth daily.    loperamide 2 MG tablet Commonly known as: IMODIUM A-D Take 2 mg by mouth every 6 (six) hours as needed for diarrhea or loose stools. Do not exceed 8mg  in 24 hours.    metoprolol tartrate 25 MG tablet Commonly known as: LOPRESSOR Take 1 tablet (25 mg total) by mouth 2 (two) times daily.    omeprazole 20 MG capsule Commonly known as: PRILOSEC Take 20 mg by mouth daily.    potassium chloride SA 20 MEQ tablet Commonly known as: KLOR-CON M Take 1 tablet (20 mEq total) by mouth every other day. With Lasix    vitamin B-12 250 MCG tablet Commonly known as: CYANOCOBALAMIN Take 250 mcg by mouth every other day.    Vitamin D 125 MCG (5000 UT) Caps Take 5,000 Units by mouth daily.    Relevant Imaging Results:  Relevant Lab Results:   Additional Information SSN [id number]  [name], [place]

## 2023-11-21 NOTE — Discharge Summary (Addendum)
Physician Discharge Summary  Christina Morgan XLK:[id number] DOB: [birth year] DOA: 11/08/2023  PCP: Almetta Lovely, [facility]  Admit date: 11/08/2023 Discharge date: 11/21/2023  Time spent: 45 minutes  Recommendations for Outpatient Follow-up:  Back to ALF with hospice services   Discharge Diagnoses:  Principal Problem:   Acute on chronic diastolic CHF (congestive heart failure) (HCC) Pulmonary hypertension AKI on CKD 3B Hyponatremia UTI Adult failure to thrive   PAF (paroxysmal atrial fibrillation) (HCC)   Chronic kidney disease, stage 3b (HCC)   Essential hypertension, benign   GERD (gastroesophageal reflux disease)   Dyslipidemia   Malnutrition of moderate degree   Discharge Condition: Stable  Diet recommendation: Mechanical soft diet, meds crushed in applesauce, Low-salt, heart healthy  Filed Weights   11/19/23 0440 11/20/23 0519 11/21/23 0500  Weight: 66.8 kg 67 kg 69 kg    History of present illness:  89/F w hypertension, hyperlipidemia, paroxysmal atrial fibrillation, and CKD who presented with worsening lower extremity edema.dyspnea over the last 24 hrs prior to admission. Her diuretic dose was increased at SNF with no significant improvement in her symptoms. She was seen at [facility] ED, found volume overloaded and in atrial fibrillation with RVR, transferred to [facility] . In ED, Afib RVR positive JVD,positive lower extremity edema. BUN 21 cr 1,29 BNP 871 troponin 9 and 9 hgb 10,8 CXR w cardiomegaly, bilateral hilar vascular congestion and cephalization of the vasculature. Bilateral pleural effusions.   Hospital Course:   Acute on chronic diastolic CHF  Pulm Hypertension -Admitted with volume overload hypoxia and confusion  -echo with EF 55 to 60%, RV with preserved systolic function, RVSP 44,6 mmHg, LA with moderate dilatation, mild to moderate AVR - diuresed with IV Lasix, 7.59 L negative, complicated by worsening creatinine and sodium  - not SGLT2i candidate w/  UTI, now transitioned to oral diuretics -prognosis not great, Cards suggestive conservative mgt ? Hospice and signed off -Palliative care consulted, family meeting completed, plan for hospice services at discharge back to ALF    Acute hypoxemic respiratory failure due to acute cardiogenic pulmonary edema.  -As above, weaned O2   Acute metabolic encephalopathy,  -Some improvement noted, abnormal UA concerning for UTI,  -Completed 5 days of antibiotics   PAF (paroxysmal atrial fibrillation) (HCC) In the past she has been on B blocker, flecainide and amiodarone, all discontinued due to patient converting to sinus rhythm with bradycardia. 04/2022 had direct current cardioversion, follow up 05/2022 patient was back in atrial fibrillation.  -Continue Cardizem CD, started metoprolol -apixaban continued -per Cards rate control strategy recommended   Chronic kidney disease, stage 3b (HCC) AKI, cardio renal syndrome.  -improving -Renal US negative for hydronephrosis -Creatinine has plateaued now   Essential hypertension, benign Continue diltiazem.    GERD (gastroesophageal reflux disease) Continue pantoprazole.   Dyslipidemia Continue statin   Code Status: DNR   Consultations: Cardiology, palliative care  Discharge Exam: Vitals:   11/21/23 1000 11/21/23 1105  BP:  111/84  Pulse:  88  Resp: 20 19  Temp:  97.9 F (36.6 C)  SpO2:  96%   Gen: Elderly chronically ill female laying in bed, awake alert, oriented to self and partly to place only HEENT: No JVD CVS: S1-S2, irregular rhythm Lungs: Decreased breath sounds at the bases Abdomen: Soft, nontender, bowel sounds present Extremities: No edema Skin: no new rashes on exposed skin   Discharge Instructions   Discharge Instructions     Diet - low sodium heart healthy   Complete by: As directed  Discharge wound care:   Complete by: As directed    As current   Increase activity slowly   Complete by: As directed        Allergies as of 11/21/2023       Reactions   Penicillins Itching, Other (See Comments)   Dizziness to the patient of passing out- told by MD to "never take this again" Did it involve swelling of the face/tongue/throat, SOB, or low BP? No Did it involve sudden or severe rash/hives, skin peeling, or any reaction on the inside of your mouth or nose? No Did you need to seek medical attention at a hospital or doctor's office? No When did it last happen? "a long time ago"    If all above answers are "NO", may proceed with cephalosporin use.        Medication List     STOP taking these medications    amLODipine 10 MG tablet Commonly known as: NORVASC   hydrALAZINE 50 MG tablet Commonly known as: APRESOLINE   torsemide 10 MG tablet Commonly known as: DEMADEX       TAKE these medications    Acetaminophen 8 Hour 650 MG CR tablet Generic drug: acetaminophen Take 650 mg by mouth every 8 (eight) hours as needed for fever or pain.   apixaban 2.5 MG Tabs tablet Commonly known as: ELIQUIS Take 1 tablet (2.5 mg total) by mouth 2 (two) times daily. What changed:  medication strength how much to take   ascorbic acid 500 MG tablet Commonly known as: VITAMIN C Take 1 tablet (500 mg total) by mouth daily.   atorvastatin 10 MG tablet Commonly known as: LIPITOR Take 1 tablet (10 mg total) by mouth at bedtime.   DAILY VITE PO Take 1 tablet by mouth daily.   diltiazem 300 MG 24 hr capsule Commonly known as: CARDIZEM CD Take 1 capsule (300 mg total) by mouth daily.   furosemide 40 MG tablet Commonly known as: Lasix Take 1 tablet (40 mg total) by mouth every other day.   letrozole 2.5 MG tablet Commonly known as: FEMARA Take 2.5 mg by mouth daily.   loperamide 2 MG tablet Commonly known as: IMODIUM A-D Take 2 mg by mouth every 6 (six) hours as needed for diarrhea or loose stools. Do not exceed 8mg  in 24 hours.   metoprolol tartrate 25 MG tablet Commonly known as:  LOPRESSOR Take 1 tablet (25 mg total) by mouth 2 (two) times daily.   omeprazole 20 MG capsule Commonly known as: PRILOSEC Take 20 mg by mouth daily.   potassium chloride SA 20 MEQ tablet Commonly known as: KLOR-CON M Take 1 tablet (20 mEq total) by mouth every other day. With Lasix   vitamin B-12 250 MCG tablet Commonly known as: CYANOCOBALAMIN Take 250 mcg by mouth every other day.   Vitamin D 125 MCG (5000 UT) Caps Take 5,000 Units by mouth daily.               Discharge Care Instructions  (From admission, onward)           Start     Ordered   11/21/23 0000  Discharge wound care:       Comments: As current   11/21/23 0904           Allergies  Allergen Reactions   Penicillins Itching and Other (See Comments)    Dizziness to the patient of passing out- told by MD to "never take this again" Did it involve  swelling of the face/tongue/throat, SOB, or low BP? No Did it involve sudden or severe rash/hives, skin peeling, or any reaction on the inside of your mouth or nose? No Did you need to seek medical attention at a hospital or doctor's office? No When did it last happen? "a long time ago"    If all above answers are "NO", may proceed with cephalosporin use.       The results of significant diagnostics from this hospitalization (including imaging, microbiology, ancillary and laboratory) are listed below for reference.    Significant Diagnostic Studies: DG Swallowing Func-Speech Pathology  Result Date: 11/19/2023 Table formatting from the original result was not included. Modified Barium Swallow Study Patient Details Name: Christina Morgan MRN: [id number] Date of Birth: March 28, 1933 Today's Date: 11/19/2023 HPI/PMH: HPI: Christina Morgan was admitted to the hospital with the working diagnosis of atrial fibrillation with RVR in the setting of heart failure decompensation. Pt encephalopathic as of 12/1 though volume status improved. Clinical Impression: Clinical  Impression: Pt presents with a mild oropharyngeal dypshagia c/b prolonged oral phase with lingual discoordination, delayed swallow intiation, and reduced base of tongue retraction.  These deficits resulted in transient, very trace penetration of thin liquid during the swallow which was fully cleared.  Mild vallecula residue was cleared with spontaneous subsequent swallow.  Pt with poor head positioning with head to L side and difficulty holding head up at midline, but this did not results in decreased airway protection. There was prolonged oral phase with solids.  Pt had difficulty masticating soft solid trial and only accepted a portion of the bolus.  With simulated ground texture there was prolonged oral phase with disorganized A-P transit.  Discussed diet preference with pt would would acknowledges she is having some trouble moving food back to swallow and would prefer puree diet at this time.  She can advance back to a dysphagia 2 diet if desired, and hopefully will return to regular diet with overall improvement.  During pill simulation there was no penetration of liquid was or oral or pharyngeal stasis of tablet.  There was esophageal retention of tablet and barium contrast.  An additional bolus of puree did help to move tablet but it did not fully clear.  Contrast observed throughout thoracic esophagus with some backflow below the PES.  Further trials were not give to push tablet through esophagus, SLP opted to allow contrast to clear with time.  Consider further esophageal asessment if indicated.   Recommend pureed diet with thin liquids. Factors that may increase risk of adverse event in presence of aspiration [name] 2021): No data recorded Recommendations/Plan: Swallowing Evaluation Recommendations Swallowing Evaluation Recommendations Recommendations: PO diet PO Diet Recommendation: Dysphagia 1 (Pureed); Thin liquids (Level 0) Liquid Administration via: Cup; Straw Medication Administration:  Whole meds with liquid Supervision: Staff to assist with self-feeding Swallowing strategies  : Slow rate; Small bites/sips Postural changes: Position pt fully upright for meals; Stay upright 30-60 min after meals Oral care recommendations: Oral care BID (2x/day) Recommended consults: Consider esophageal assessment (if warranted) Caregiver Recommendations: Avoid jello, ice cream, thin soups, popsicles; Remove water pitcher; Have oral suction available Treatment Plan Treatment Plan Treatment recommendations: Therapy as outlined in treatment plan below Follow-up recommendations: Follow physicians's recommendations for discharge plan and follow up therapies Functional status assessment: Patient has had a recent decline in their functional status and demonstrates the ability to make significant improvements in function in a reasonable and predictable amount of time. Treatment frequency: Min 2x/week Treatment  duration: 2 weeks Interventions: Aspiration precaution training; Diet toleration management by SLP; Trials of upgraded texture/liquids Recommendations Recommendations for follow up therapy are one component of a multi-disciplinary discharge planning process, led by the attending physician.  Recommendations may be updated based on patient status, additional functional criteria and insurance authorization. Assessment: Orofacial Exam: Orofacial Exam Oral Cavity - Dentition: Adequate natural dentition Orofacial Anatomy: WFL Oral Motor/Sensory Function: -- (See clinical assessment) Anatomy: Anatomy: -- (Poor positioning.  Difficulty holding head upright) Boluses Administered: Boluses Administered Boluses Administered: Thin liquids (Level 0); Puree; Solid  Oral Impairment Domain: Oral Impairment Domain Lip Closure: No labial escape Tongue control during bolus hold: Cohesive bolus between tongue to palatal seal Bolus preparation/mastication: Slow prolonged chewing/mashing with complete recollection Bolus transport/lingual  motion: Repetitive/disorganized tongue motion Oral residue: Trace residue lining oral structures Location of oral residue : Tongue Initiation of pharyngeal swallow : Valleculae  Pharyngeal Impairment Domain: Pharyngeal Impairment Domain Soft palate elevation: No bolus between soft palate (SP)/pharyngeal wall (PW) Laryngeal elevation: Partial superior movement of thyroid cartilage/partial approximation of arytenoids to epiglottic petiole Anterior hyoid excursion: Partial anterior movement Epiglottic movement: Complete inversion Laryngeal vestibule closure: Complete, no air/contrast in laryngeal vestibule Pharyngeal stripping wave : Present - diminished Pharyngeal contraction (A/P view only): N/A Pharyngoesophageal segment opening: Partial distention/partial duration, partial obstruction of flow Tongue base retraction: Trace column of contrast or air between tongue base and PPW Pharyngeal residue: Trace residue within or on pharyngeal structures Location of pharyngeal residue: Valleculae  Esophageal Impairment Domain: Esophageal Impairment Domain Esophageal clearance upright position: Esophageal retention with retrograde flow below pharyngoesophageal segment (PES) Pill: Pill Consistency administered: Thin liquids (Level 0) Penetration/Aspiration Scale Score: Penetration/Aspiration Scale Score 1.  Material does not enter airway: Puree; Pill; Solid; Thin liquids (Level 0) 2.  Material enters airway, remains ABOVE vocal cords then ejected out: Thin liquids (Level 0) Compensatory Strategies: Compensatory Strategies Compensatory strategies: No   General Information: Caregiver present: No  Diet Prior to this Study: Dysphagia 2 (finely chopped); Mildly thick liquids (Level 2, nectar thick)   No data recorded  Respiratory Status: Tachypneic (increased RR per monitor, but did not appear to be working too hard to breathe)   Supplemental O2: Nasal cannula   No data recorded Behavior/Cognition: Alert; Cooperative No data recorded  Baseline vocal quality/speech: Normal No data recorded Volitional Swallow: Able to elicit Exam Limitations: No limitations Goal Planning: Prognosis for improved oropharyngeal function: Good No data recorded No data recorded No data recorded Consulted and agree with results and recommendations: Patient; Physician; Nurse Pain: Pain Assessment Pain Assessment: No/denies pain Breathing: 0 Negative Vocalization: 0 Facial Expression: 0 Body Language: 0 Consolability: 0 PAINAD Score: 0 End of Session: Start Time:SLP Start Time (ACUTE ONLY): 1541 Stop Time: SLP Stop Time (ACUTE ONLY): 1555 Time Calculation:SLP Time Calculation (min) (ACUTE ONLY): 14 min Charges: SLP Evaluations $ SLP Speech Visit: 1 Visit SLP Evaluations $BSS Swallow: 1 Procedure $MBS Swallow: 1 Procedure SLP visit diagnosis: SLP Visit Diagnosis: Dysphagia, oropharyngeal phase (R13.12) Past Medical History: Past Medical History: Diagnosis Date  Cancer (HCC)   endometrial ca  Chronic diastolic CHF (congestive heart failure) (HCC)   Chronic edema   CKD (chronic kidney disease), stage III (HCC)   Dyslipidemia   Fracture of left humerus   History of cardiovascular stress test   Lexiscan Myoview (06/2014): No ischemia or scar, not gated, low risk  Hx of echocardiogram   Echo (05/02/14): EF 60% to 65%. No regional wall motion abnormalities. Mild AI, mildly dilated  aortic root (37 mm), trivial MR, trivial TR  Hyperlipidemia   Hypertension   Lipoma of skin   multiple  Osteopenia   PAF (paroxysmal atrial fibrillation) (HCC)   failed DCCV 05/2014 >> Flecainide started >> DCCV 7/15 sucessful;  f/u ETT neg for pro-arrhythmia >> recurrent AF/AFL >> Flecainide inc to 100 bid with repeat DCCV 9/15  Vitamin D deficiency 09/25/2019 Past Surgical History: Past Surgical History: Procedure Laterality Date  CARDIOVERSION N/A 06/05/2014  Procedure: CARDIOVERSION;  Surgeon: Quintella Reichert, MD;  Location: [facility] ENDOSCOPY;  Service: Cardiovascular;  Laterality: N/A;  CARDIOVERSION N/A  07/11/2014  Procedure: CARDIOVERSION;  Surgeon: Quintella Reichert, MD;  Location: [facility] ENDOSCOPY;  Service: Cardiovascular;  Laterality: N/A;  CARDIOVERSION N/A 09/04/2014  Procedure: CARDIOVERSION;  Surgeon: Quintella Reichert, MD;  Location: [facility] ENDOSCOPY;  Service: Cardiovascular;  Laterality: N/A;  CARDIOVERSION N/A 05/04/2022  Procedure: CARDIOVERSION;  Surgeon: Little Ishikawa, MD;  Location: [facility] ENDOSCOPY;  Service: Cardiovascular;  Laterality: N/A;  COLONOSCOPY  2008  COLONOSCOPY N/A 05/20/2015  Procedure: COLONOSCOPY;  Surgeon: Bernette Redbird, MD;  Location: [facility] ENDOSCOPY;  Service: Endoscopy;  Laterality: N/A;  FEMUR IM NAIL Right 09/24/2019  Procedure: INTRAMEDULLARY (IM) NAIL FEMORAL;  Surgeon: Myrene Galas, MD;  Location: [facility] OR;  Service: Orthopedics;  Laterality: Right;  IR KYPHO EA ADDL LEVEL THORACIC OR LUMBAR  04/12/2019  IR KYPHO THORACIC WITH BONE BIOPSY  04/12/2019  ROBOTIC ASSISTED SUPRACERVICAL HYSTERECTOMY WITH BILATERAL SALPINGO OOPHERECTOMY  11/06/2012  and bilateral pelvic lymph node dissection Kerrie Pleasure, MA, CCC-SLP Acute Rehabilitation Services Office: [phone] 11/19/2023, 4:40 PM  DG CHEST PORT 1 VIEW  Result Date: 11/16/2023 CLINICAL DATA:  Hypoxia. EXAM: PORTABLE CHEST 1 VIEW COMPARISON:  11/13/2023 and 11/10/2023. FINDINGS: Vascular congestion and hazy airspace lung opacities have improved from prior exam. Linear opacity extending from the right hilum along minor fissure is less prominent, suspected to be atelectasis or fissural fluid. Moderate bilateral pleural effusions are similar, obscuring both hemidiaphragms. No pneumothorax. Cardiac silhouette is enlarged, stable. IMPRESSION: 1. Interval improvement in lung aeration consistent with improved pulmonary edema. No new abnormalities. 2. Persistent moderate bilateral pleural effusions. Electronically Signed   By: Amie Portland M.D.   On: 11/16/2023 10:33   DG Chest 1 View  Result Date: 11/13/2023 CLINICAL DATA:  Follow-up,  according to epic notes atrial fibrillation, heart failure decompensation EXAM: CHEST  1 VIEW COMPARISON:  11/10/2023, 11/08/2023 FINDINGS: Cardiac enlargement with vascular congestion and at least moderate size pleural effusions, slightly increased compared to prior. Suspect background pulmonary edema. Bibasilar consolidations. Aortic atherosclerosis. Old appearing left humerus deformity. IMPRESSION: Cardiac enlargement with vascular congestion and at least moderate size pleural effusions, slightly increased compared to prior. Suspect background pulmonary edema. Bibasilar consolidations. Electronically Signed   By: Jasmine Pang M.D.   On: 11/13/2023 18:00   US RENAL  Result Date: 11/11/2023 : PROCEDURE: US RENAL HISTORY: Patient is a 89 y/o F with [phone] AKI (acute kidney injury) (HCC) [id number]. COMPARISON: CT abdomen/pelvis 03/06/2022. TECHNIQUE: Two-dimensional grayscale and color Doppler ultrasound of the kidneys was performed. FINDINGS: The urinary bladder is minimally distended and suboptimally visualized. The right kidney measures 8.9 cm. Renal cortical echotexture is increased and thinned. There is no hydronephrosis. There are no stones. There are no cysts. The left kidney measures 10.0 cm. Renal cortical echotexture is increased. There is moderate hydronephrosis. There are no stones. There are no cysts. Incidentally, bilateral pleural effusions are noted. IMPRESSION: 1. Increased echogenicity of the bilateral renal cortices, consistent with  medical renal disease. 2.  Moderate left hydronephrosis. Thank you for allowing [place] to assist in the care of this patient. Electronically Signed   By: Lestine Box M.D.   On: 11/11/2023 09:05   DG Chest 1 View  Result Date: 11/10/2023 CLINICAL DATA:  Pulmonary edema EXAM: CHEST  1 VIEW COMPARISON:  X-ray 11/09/2023 and older. FINDINGS: Film is rotated to the left slightly. Slightly kyphotic as well obscured in the left lung apex. Enlarged cardiopericardial  silhouette with a calcified aorta. Persistent small effusions and some vascular congestion. Stable interstitial changes. No pneumothorax. Degenerative changes of the spine with the areas of augmentation cement. Overlapping cardiac leads. IMPRESSION: Overall no significant interval change when adjusting for technique. Electronically Signed   By: Karen Kays M.D.   On: 11/10/2023 14:35   DG Chest Port 1 View  Result Date: 11/10/2023 CLINICAL DATA:  [id number] with shortness of breath and tachycardia. EXAM: PORTABLE CHEST 1 VIEW COMPARISON:  Portable chest 11/08/2023 FINDINGS: 1:49 a.m. Cardiomegaly is stable. There is increased perihilar vascular congestion, and increased lower zonal interstitial edema. There are increased moderate layering pleural effusions, with increased overlying atelectasis or consolidation in the lower lung fields. The upper lung fields show no focal opacity. Stable mediastinum with aortic tortuosity and atherosclerosis. No new osseous abnormality. IMPRESSION: 1. Increased perihilar vascular congestion and lower zonal interstitial edema. 2. Increased moderate layering pleural effusions, with increased overlying atelectasis or consolidation in the lower lung fields. 3. Stable cardiomegaly. Electronically Signed   By: Almira Bar M.D.   On: 11/10/2023 02:13   ECHOCARDIOGRAM COMPLETE  Result Date: 11/09/2023    ECHOCARDIOGRAM REPORT   Patient Name:   Christina Morgan Date of Exam: 11/09/2023 Medical Rec #:  [id number]           Height:       64.0 in Accession #:    [id number]          Weight:       166.4 lb Date of Birth:  1933/06/02            BSA:          1.809 m Patient Age:    89 years            BP:           121/82 mmHg Patient Gender: F                   HR:           120 bpm. Exam Location:  Inpatient Procedure: 2D Echo, Cardiac Doppler, Color Doppler and Intracardiac            Opacification Agent Indications:    CHF-Acute Diastolic I50.31  History:        Patient has prior  history of Echocardiogram examinations, most                 recent 01/03/2022. Arrythmias:Atrial Fibrillation; Risk                 Factors:Hypertension.  Sonographer:    Darlys Gales Referring Phys: [id number] RONDELL A SMITH IMPRESSIONS  1. Left ventricular ejection fraction, by estimation, is 55 to 60%. The left ventricle has normal function. The left ventricle has no regional wall motion abnormalities. Indeterminate diastolic filling due to E-A fusion.  2. Right ventricular systolic function is normal. The right ventricular size is normal. There is mildly elevated pulmonary artery systolic pressure. The estimated right ventricular systolic pressure  is 44.6 mmHg.  3. Left atrial size was moderately dilated.  4. The mitral valve is abnormal. Mild mitral valve regurgitation. No evidence of mitral stenosis. Severe mitral annular calcification.  5. The aortic valve has an indeterminant number of cusps. Aortic valve regurgitation is mild to moderate. No aortic stenosis is present.  6. The inferior vena cava is dilated in size with <50% respiratory variability, suggesting right atrial pressure of 15 mmHg. FINDINGS  Left Ventricle: Left ventricular ejection fraction, by estimation, is 55 to 60%. The left ventricle has normal function. The left ventricle has no regional wall motion abnormalities. The left ventricular internal cavity size was normal in size. There is  no left ventricular hypertrophy. Indeterminate diastolic filling due to E-A fusion. Right Ventricle: The right ventricular size is normal. No increase in right ventricular wall thickness. Right ventricular systolic function is normal. There is mildly elevated pulmonary artery systolic pressure. The tricuspid regurgitant velocity is 2.72  m/s, and with an assumed right atrial pressure of 15 mmHg, the estimated right ventricular systolic pressure is 44.6 mmHg. Left Atrium: Left atrial size was moderately dilated. Right Atrium: Right atrial size was normal in  size. Pericardium: There is no evidence of pericardial effusion. Mitral Valve: The mitral valve is abnormal. Severe mitral annular calcification. Mild mitral valve regurgitation. No evidence of mitral valve stenosis. Tricuspid Valve: The tricuspid valve is normal in structure. Tricuspid valve regurgitation is mild . No evidence of tricuspid stenosis. Aortic Valve: The aortic valve has an indeterminant number of cusps. Aortic valve regurgitation is mild to moderate. Aortic regurgitation PHT measures 677 msec. No aortic stenosis is present. Aortic valve mean gradient measures 5.8 mmHg. Aortic valve peak gradient measures 9.7 mmHg. Aortic valve area, by VTI measures 1.42 cm. Pulmonic Valve: The pulmonic valve was normal in structure. Pulmonic valve regurgitation is mild. No evidence of pulmonic stenosis. Aorta: The aortic root is normal in size and structure. Venous: The inferior vena cava is dilated in size with less than 50% respiratory variability, suggesting right atrial pressure of 15 mmHg. IAS/Shunts: No atrial level shunt detected by color flow Doppler.  LEFT VENTRICLE PLAX 2D LVIDd:         4.50 cm LVIDs:         3.50 cm LV PW:         1.00 cm LV IVS:        0.95 cm LVOT diam:     1.80 cm LV SV:         40 LV SV Index:   22 LVOT Area:     2.54 cm  LEFT ATRIUM              Index        RIGHT ATRIUM           Index LA Vol (A2C):   106.0 ml 58.58 ml/m  RA Area:     19.50 cm LA Vol (A4C):   73.9 ml  40.84 ml/m  RA Volume:   53.30 ml  29.46 ml/m LA Biplane Vol: 89.0 ml  49.19 ml/m  AORTIC VALVE AV Area (Vmax):    1.46 cm AV Area (Vmean):   1.38 cm AV Area (VTI):     1.42 cm AV Vmax:           155.60 cm/s AV Vmean:          115.740 cm/s AV VTI:            0.280 m AV Peak  Grad:      9.7 mmHg AV Mean Grad:      5.8 mmHg LVOT Vmax:         89.30 cm/s LVOT Vmean:        62.550 cm/s LVOT VTI:          0.156 m LVOT/AV VTI ratio: 0.56 AI PHT:            677 msec MITRAL VALVE                TRICUSPID VALVE MV Area  (PHT): 5.93 cm     TR Peak grad:   29.6 mmHg MV Decel Time: 128 msec     TR Vmax:        272.00 cm/s MV E velocity: 124.00 cm/s                             SHUNTS                             Systemic VTI:  0.16 m                             Systemic Diam: 1.80 cm Vishnu Priya Mallipeddi Electronically signed by Winfield Rast Mallipeddi Signature Date/Time: 11/09/2023/11:05:02 AM    Final    DG Chest Port 1 View  Result Date: 11/08/2023 CLINICAL DATA:  Shortness of breath.  History of CHF. EXAM: PORTABLE CHEST 1 VIEW COMPARISON:  03/06/2022 FINDINGS: Unchanged enlarged cardiac silhouette and mediastinal contours with atherosclerotic plaque within the thoracic aorta. There is persistent rightward deviation of the tracheal air column at the level of the aortic arch. Unchanged small layering bilateral effusions and associated bibasilar opacities. Pulmonary vasculature indistinct with cephalization flow. No pneumothorax. No acute osseous abnormalities. IMPRESSION: Similar findings of cardiomegaly, pulmonary edema and small layering bilateral effusions. Electronically Signed   By: Simonne Come M.D.   On: 11/08/2023 13:35   US Venous Img Lower Bilateral  Result Date: 11/08/2023 CLINICAL DATA:  Bilateral lower extremity edema, right-greater-than-left. Evaluate for DVT. EXAM: BILATERAL LOWER EXTREMITY VENOUS DOPPLER ULTRASOUND TECHNIQUE: Gray-scale sonography with graded compression, as well as color Doppler and duplex ultrasound were performed to evaluate the lower extremity deep venous systems from the level of the common femoral vein and including the common femoral, femoral, profunda femoral, popliteal and calf veins including the posterior tibial, peroneal and gastrocnemius veins when visible. The superficial great saphenous vein was also interrogated. Spectral Doppler was utilized to evaluate flow at rest and with distal augmentation maneuvers in the common femoral, femoral and popliteal veins. COMPARISON:   None Available. FINDINGS: RIGHT LOWER EXTREMITY Common Femoral Vein: No evidence of thrombus. Normal compressibility, respiratory phasicity and response to augmentation. Saphenofemoral Junction: No evidence of thrombus. Normal compressibility and flow on color Doppler imaging. Profunda Femoral Vein: No evidence of thrombus. Normal compressibility and flow on color Doppler imaging. Femoral Vein: No evidence of thrombus. Normal compressibility, respiratory phasicity and response to augmentation. Popliteal Vein: No evidence of thrombus. Normal compressibility, respiratory phasicity and response to augmentation. Calf Veins: No evidence of thrombus. Normal compressibility and flow on color Doppler imaging. Superficial Great Saphenous Vein: No evidence of thrombus. Normal compressibility. Other Findings: There is a moderate amount of subcutaneous edema at the level of the right lower leg and calf. LEFT LOWER EXTREMITY Common Femoral Vein: No evidence  of thrombus. Normal compressibility, respiratory phasicity and response to augmentation. Saphenofemoral Junction: No evidence of thrombus. Normal compressibility and flow on color Doppler imaging. Profunda Femoral Vein: No evidence of thrombus. Normal compressibility and flow on color Doppler imaging. Femoral Vein: No evidence of thrombus. Normal compressibility, respiratory phasicity and response to augmentation. Popliteal Vein: No evidence of thrombus. Normal compressibility, respiratory phasicity and response to augmentation. Calf Veins: No evidence of thrombus. Normal compressibility and flow on color Doppler imaging. Superficial Great Saphenous Vein: No evidence of thrombus. Normal compressibility. Other Findings: There is a moderate amount of subcutaneous edema at the level of the left lower leg and calf. IMPRESSION: No evidence of DVT within either lower extremity. Electronically Signed   By: Simonne Come M.D.   On: 11/08/2023 13:34    Microbiology: No results found  for this or any previous visit (from the past 240 hour(s)).   Labs: Basic Metabolic Panel: Recent Labs  Lab 11/18/23 0227 11/18/23 1441 11/19/23 0256 11/20/23 0254 11/21/23 0241  NA 151* 148* 146* 150* 145  K 3.3* 3.0* 3.1* 3.5 4.0  CL 93* 93* 91* 98 96*  CO2 42* 42* 42* 41* 37*  GLUCOSE 102* 245* 141* 117* 120*  BUN 51* 54* 47* 44* 45*  CREATININE 2.12* 2.33* 2.07* 2.00* 1.87*  CALCIUM 9.5 9.4 9.0 9.2 8.9  MG  --   --   --   --  2.5*   Liver Function Tests: Recent Labs  Lab 11/17/23 0244  AST 26  ALT 24  ALKPHOS 96  BILITOT 0.7  PROT 6.4*  ALBUMIN 3.3*   No results for input(s): "LIPASE", "AMYLASE" in the last 168 hours. Recent Labs  Lab 11/16/23 1536  AMMONIA 27   CBC: Recent Labs  Lab 11/15/23 0257 11/16/23 0234 11/17/23 0244 11/18/23 0227 11/20/23 0254  WBC 7.8 7.8 8.2 7.1 6.6  HGB 11.6* 11.7* 11.6* 12.0 11.7*  HCT 37.9 37.6 39.6 39.0 38.7  MCV 85.4 84.9 86.7 85.3 86.0  PLT 348 329 303 290 249   Cardiac Enzymes: No results for input(s): "CKTOTAL", "CKMB", "CKMBINDEX", "TROPONINI" in the last 168 hours. BNP: BNP (last 3 results) Recent Labs    11/08/23 1223 11/10/23 0244  BNP 871.9* 1,299.8*    ProBNP (last 3 results) No results for input(s): "PROBNP" in the last 8760 hours.  CBG: No results for input(s): "GLUCAP" in the last 168 hours.     Signed:  Zannie Cove MD.  [facility] 11/21/2023, 12:25 PM

## 2023-11-21 NOTE — TOC Progression Note (Addendum)
Transition of Care [facility]) - Progression Note    Patient Details  Name: Christina Morgan MRN: [id number] Date of Birth: 19-Mar-1933  [facility] CM/SW Contact  [name], [place] Phone Number: 11/21/2023, 9:54 AM  Clinical Narrative:   Per [facility], pts hospital bed and O2 should be delivered to ALF by noon today.  CSW notified MD that will need DC summary and any scripts signed to send to ALF. CSW notified MD that pt will have to return to ALF before 5PM.   11:29AM: CSW received message from pts dtr, [name] stating pts hospital bed and oxygen has been delivered to ALF. CSW called and left a VM for facility to confirm.    12:42PM: CSW left 2 VMs for ALF director in regard to pts DC.   2:26PM: CSW called ALF director and left a VM about pt dc to facility.   2:50PM: CSW received call from [facility] at ALF for pts dc. She will review FL2 and DC summary and call CSW back about dc. CSW informed Imane about pt being weened to 1L O2 [place].  CSW called mobility tech at ALF and confirmed that hospital bed and O2 is in pts room.   3:00PM: Imane at ALF called CSW to confirm that everything is good for pt to return.    CSW called ptar and transportation will be able to pickup pt in a couple hours per representative. CSW informed nurse, ALF, [facility] and family of late transport.     Barriers to Discharge: Continued Medical Work up, SNF Pending bed offer  Expected Discharge Plan and Services       Living arrangements for the past 2 months: Assisted Living Facility Expected Discharge Date: 11/21/23               DME Arranged: Dan Humphreys, Wheelchair manual                     Social Determinants of Health (SDOH) Interventions SDOH Screenings   Food Insecurity: No Food Insecurity (11/09/2023)  Housing: Low Risk  (11/09/2023)  Transportation Needs: No Transportation Needs (11/09/2023)  Utilities: Not At Risk (11/09/2023)  Financial Resource Strain: Low Risk   (09/27/2019)  Physical Activity: Inactive (09/27/2019)  Social Connections: Unknown (04/15/2022)   Received from [facility], [facility]  Stress: No Stress Concern Present (09/27/2019)  Tobacco Use: Low Risk  (11/08/2023)    Readmission Risk Interventions    03/11/2022   11:40 AM  Readmission Risk Prevention Plan  Transportation Screening Complete  Medication Review (RN Care Manager) Complete  PCP or Specialist appointment within 3-5 days of discharge Complete  HRI or Home Care Consult Complete  SW Recovery Care/Counseling Consult Complete  Palliative Care Screening Not Applicable  Skilled Nursing Facility Not Applicable

## 2023-11-21 NOTE — TOC Transition Note (Addendum)
Transition of Care [facility]) - CM/SW Discharge Note   Patient Details  Name: Christina Morgan MRN: [id number] Date of Birth: [birth year]  [facility]) CM/SW Contact:  [name], LCSWA Phone Number: 11/21/2023, 3:23 PM   Clinical Narrative:   Transportation canceled and family notified.    Patient will DC to: [facility] ALF Anticipated DC date: 11/21/2023 Family notified: [name] (dtr) Transport by: [name]   Per MD patient ready for DC to [facility] ALF. RN to call report prior to discharge [phone]). RN, patient, patient's family, and facility notified of DC. Discharge Summary and FL2 sent to facility. DC packet on chart. Ambulance transport requested for patient.   CSW will sign off for now as social work intervention is no longer needed. Please consult [place] again if new needs arise.   Final next level of care: Assisted Living (w/ hospice) Barriers to Discharge: Barriers Resolved   Patient Goals and CMS Choice CMS Medicare.gov Compare Post Acute Care list provided to:: Other (Comment Required) Choice offered to / list presented to : Adult Children  Discharge Placement                  Patient to be transferred to facility by: [facility] Name of family member notified: [name] - dtr Patient and family notified of of transfer: 11/21/23  Discharge Plan and Services Additional resources added to the After Visit Summary for                  DME Arranged: Hospital bed, Oxygen DME Agency: Other - Comment ([facility] set up DME)                  Social Determinants of Health (SDOH) Interventions SDOH Screenings   Food Insecurity: No Food Insecurity (11/09/2023)  Housing: Low Risk  (11/09/2023)  Transportation Needs: No Transportation Needs (11/09/2023)  Utilities: Not At Risk (11/09/2023)  Financial Resource Strain: Low Risk  (09/27/2019)  Physical Activity: Inactive (09/27/2019)  Social Connections: Unknown (04/15/2022)   Received  from [facility], [facility]  Stress: No Stress Concern Present (09/27/2019)  Tobacco Use: Low Risk  (11/08/2023)     Readmission Risk Interventions    03/11/2022   11:40 AM  Readmission Risk Prevention Plan  Transportation Screening Complete  Medication Review Oceanographer) Complete  PCP or Specialist appointment within 3-5 days of discharge Complete  HRI or Home Care Consult Complete  SW Recovery Care/Counseling Consult Complete  Palliative Care Screening Not Applicable  Skilled Nursing Facility Not Applicable

## 2023-11-22 NOTE — Plan of Care (Signed)
Plan of care reviewed. Pt has been progressing. She is stable hemodynamically, afebrile, no acute distress noted.   Problem: Health Behavior/Discharge Planning: Goal: Ability to manage health-related needs will improve Outcome: Progressing   Problem: Clinical Measurements: Goal: Ability to maintain clinical measurements within normal limits will improve Outcome: Progressing Goal: Will remain free from infection Outcome: Progressing Goal: Diagnostic test results will improve Outcome: Progressing Goal: Respiratory complications will improve Outcome: Progressing Goal: Cardiovascular complication will be avoided Outcome: Progressing   Problem: Activity: Goal: Risk for activity intolerance will decrease Outcome: Progressing   Filiberto Pinks, RN

## 2023-11-22 NOTE — TOC Transition Note (Signed)
Transition of Care [facility]) - CM/SW Discharge Note   Patient Details  Name: Christina Morgan MRN: [id number] Date of Birth: 1932/12/30  Transition of Care [facility]) CM/SW Contact:  [name], LCSWA Phone Number: 11/22/2023, 11:20 AM   Clinical Narrative:   Patient will DC to: [facility] ALF Anticipated DC date: 11/22/2023 Family notified: [name] dtr Transport by: [name]  Per MD patient ready for DC to [facility] ALF. RN to call report prior to discharge [phone]). RN, patient, patient's family, and facility notified of DC. Discharge Summary and FL2 sent to facility. DC packet on chart. Ambulance transport requested for patient.   CSW will sign off for now as social work intervention is no longer needed. Please consult [place] again if new needs arise.      Final next level of care: Assisted Living (w/ hospice) Barriers to Discharge: Barriers Resolved   Patient Goals and CMS Choice CMS Medicare.gov Compare Post Acute Care list provided to:: Other (Comment Required) Choice offered to / list presented to : Adult Children  Discharge Placement                  Patient to be transferred to facility by: [facility] Name of family member notified: [name] - dtr Patient and family notified of of transfer: 11/21/23  Discharge Plan and Services Additional resources added to the After Visit Summary for                  DME Arranged: Hospital bed, Oxygen DME Agency: Other - Comment ([facility] set up DME)                  Social Determinants of Health (SDOH) Interventions SDOH Screenings   Food Insecurity: No Food Insecurity (11/09/2023)  Housing: Low Risk  (11/09/2023)  Transportation Needs: No Transportation Needs (11/09/2023)  Utilities: Not At Risk (11/09/2023)  Financial Resource Strain: Low Risk  (09/27/2019)  Physical Activity: Inactive (09/27/2019)  Social Connections: Unknown (04/15/2022)   Received from [facility], [facility]  Stress: No  Stress Concern Present (09/27/2019)  Tobacco Use: Low Risk  (11/08/2023)     Readmission Risk Interventions    03/11/2022   11:40 AM  Readmission Risk Prevention Plan  Transportation Screening Complete  Medication Review Oceanographer) Complete  PCP or Specialist appointment within 3-5 days of discharge Complete  HRI or Home Care Consult Complete  SW Recovery Care/Counseling Consult Complete  Palliative Care Screening Not Applicable  Skilled Nursing Facility Not Applicable

## 2023-11-22 NOTE — TOC CM/SW Note (Signed)
CSW faxed updated dc summary and fl2 to ALF. CSW left VM for ALF director that pt will dc to facility today and for her to call CSW back. CSW has not heard from director at this time.   Johnnette Gourd, LCSWA

## 2023-11-23 ENCOUNTER — Other Ambulatory Visit (HOSPITAL_COMMUNITY): Payer: Self-pay

## 2024-03-12 ENCOUNTER — Ambulatory Visit (HOSPITAL_BASED_OUTPATIENT_CLINIC_OR_DEPARTMENT_OTHER): Payer: Medicare Other | Admitting: Family

## 2024-04-08 ENCOUNTER — Emergency Department (HOSPITAL_COMMUNITY): Admitting: Anesthesiology

## 2024-04-08 ENCOUNTER — Inpatient Hospital Stay (HOSPITAL_COMMUNITY)
Admission: EM | Admit: 2024-04-08 | Discharge: 2024-04-12 | DRG: 271 | Disposition: A | Discharge status: Skilled Nursing Facility | Source: Skilled Nursing Facility | Attending: Student | Admitting: Student

## 2024-04-08 ENCOUNTER — Encounter (HOSPITAL_COMMUNITY)
Admission: EM | Disposition: A | Discharge status: Skilled Nursing Facility | Payer: Self-pay | Source: Skilled Nursing Facility | Attending: Student

## 2024-04-08 ENCOUNTER — Encounter (HOSPITAL_COMMUNITY): Payer: Self-pay | Admitting: Emergency Medicine

## 2024-04-08 ENCOUNTER — Other Ambulatory Visit: Payer: Self-pay

## 2024-04-08 ENCOUNTER — Emergency Department (HOSPITAL_COMMUNITY)

## 2024-04-08 DIAGNOSIS — Z66 Do not resuscitate: Secondary | ICD-10-CM | POA: Diagnosis present

## 2024-04-08 DIAGNOSIS — Z79811 Long term (current) use of aromatase inhibitors: Secondary | ICD-10-CM | POA: Diagnosis not present

## 2024-04-08 DIAGNOSIS — Z7901 Long term (current) use of anticoagulants: Secondary | ICD-10-CM

## 2024-04-08 DIAGNOSIS — I509 Heart failure, unspecified: Secondary | ICD-10-CM

## 2024-04-08 DIAGNOSIS — Z8542 Personal history of malignant neoplasm of other parts of uterus: Secondary | ICD-10-CM

## 2024-04-08 DIAGNOSIS — N184 Chronic kidney disease, stage 4 (severe): Secondary | ICD-10-CM

## 2024-04-08 DIAGNOSIS — N1832 Chronic kidney disease, stage 3b: Secondary | ICD-10-CM

## 2024-04-08 DIAGNOSIS — K838 Other specified diseases of biliary tract: Secondary | ICD-10-CM | POA: Diagnosis present

## 2024-04-08 DIAGNOSIS — E7849 Other hyperlipidemia: Secondary | ICD-10-CM

## 2024-04-08 DIAGNOSIS — M8580 Other specified disorders of bone density and structure, unspecified site: Secondary | ICD-10-CM | POA: Diagnosis present

## 2024-04-08 DIAGNOSIS — L043 Acute lymphadenitis of lower limb: Secondary | ICD-10-CM

## 2024-04-08 DIAGNOSIS — Z880 Allergy status to penicillin: Secondary | ICD-10-CM

## 2024-04-08 DIAGNOSIS — Z90722 Acquired absence of ovaries, bilateral: Secondary | ICD-10-CM | POA: Diagnosis not present

## 2024-04-08 DIAGNOSIS — I10 Essential (primary) hypertension: Secondary | ICD-10-CM | POA: Diagnosis not present

## 2024-04-08 DIAGNOSIS — I82512 Chronic embolism and thrombosis of left femoral vein: Secondary | ICD-10-CM | POA: Diagnosis present

## 2024-04-08 DIAGNOSIS — I998 Other disorder of circulatory system: Secondary | ICD-10-CM

## 2024-04-08 DIAGNOSIS — K2100 Gastro-esophageal reflux disease with esophagitis, without bleeding: Secondary | ICD-10-CM

## 2024-04-08 DIAGNOSIS — C55 Malignant neoplasm of uterus, part unspecified: Secondary | ICD-10-CM | POA: Diagnosis present

## 2024-04-08 DIAGNOSIS — I130 Hypertensive heart and chronic kidney disease with heart failure and stage 1 through stage 4 chronic kidney disease, or unspecified chronic kidney disease: Secondary | ICD-10-CM | POA: Diagnosis present

## 2024-04-08 DIAGNOSIS — Z90711 Acquired absence of uterus with remaining cervical stump: Secondary | ICD-10-CM | POA: Diagnosis not present

## 2024-04-08 DIAGNOSIS — R319 Hematuria, unspecified: Secondary | ICD-10-CM | POA: Diagnosis not present

## 2024-04-08 DIAGNOSIS — N1330 Unspecified hydronephrosis: Secondary | ICD-10-CM | POA: Diagnosis not present

## 2024-04-08 DIAGNOSIS — I70222 Atherosclerosis of native arteries of extremities with rest pain, left leg: Secondary | ICD-10-CM | POA: Diagnosis not present

## 2024-04-08 DIAGNOSIS — I480 Paroxysmal atrial fibrillation: Secondary | ICD-10-CM | POA: Diagnosis not present

## 2024-04-08 DIAGNOSIS — I5032 Chronic diastolic (congestive) heart failure: Secondary | ICD-10-CM

## 2024-04-08 DIAGNOSIS — E785 Hyperlipidemia, unspecified: Secondary | ICD-10-CM | POA: Diagnosis present

## 2024-04-08 DIAGNOSIS — Z79899 Other long term (current) drug therapy: Secondary | ICD-10-CM

## 2024-04-08 DIAGNOSIS — Z8249 Family history of ischemic heart disease and other diseases of the circulatory system: Secondary | ICD-10-CM

## 2024-04-08 DIAGNOSIS — M79662 Pain in left lower leg: Secondary | ICD-10-CM

## 2024-04-08 DIAGNOSIS — I700 Atherosclerosis of aorta: Secondary | ICD-10-CM | POA: Diagnosis present

## 2024-04-08 DIAGNOSIS — I4891 Unspecified atrial fibrillation: Secondary | ICD-10-CM | POA: Diagnosis not present

## 2024-04-08 DIAGNOSIS — K219 Gastro-esophageal reflux disease without esophagitis: Secondary | ICD-10-CM | POA: Diagnosis not present

## 2024-04-08 DIAGNOSIS — I77810 Thoracic aortic ectasia: Secondary | ICD-10-CM | POA: Diagnosis present

## 2024-04-08 DIAGNOSIS — L89526 Pressure-induced deep tissue damage of left ankle: Secondary | ICD-10-CM | POA: Diagnosis present

## 2024-04-08 HISTORY — PX: EMBOLECTOMY: SHX44

## 2024-04-08 LAB — BASIC METABOLIC PANEL WITH GFR
Anion gap: 14 (ref 5–15)
BUN: 34 mg/dL — ABNORMAL HIGH (ref 8–23)
CO2: 23 mmol/L (ref 22–32)
Calcium: 9.2 mg/dL (ref 8.9–10.3)
Chloride: 102 mmol/L (ref 98–111)
Creatinine, Ser: 1.44 mg/dL — ABNORMAL HIGH (ref 0.44–1.00)
GFR, Estimated: 35 mL/min — ABNORMAL LOW (ref >60)
Glucose, Bld: 175 mg/dL — ABNORMAL HIGH (ref 70–99)
Potassium: 3.7 mmol/L (ref 3.5–5.1)
Sodium: 139 mmol/L (ref 135–145)

## 2024-04-08 LAB — CBC WITH DIFFERENTIAL/PLATELET
Abs Immature Granulocytes: 0.08 10*3/uL — ABNORMAL HIGH (ref 0.00–0.07)
Basophils Absolute: 0 10*3/uL (ref 0.0–0.1)
Basophils Relative: 0 %
Eosinophils Absolute: 0 10*3/uL (ref 0.0–0.5)
Eosinophils Relative: 0 %
HCT: 40.5 % (ref 36.0–46.0)
Hemoglobin: 13.2 g/dL (ref 12.0–15.0)
Immature Granulocytes: 1 %
Lymphocytes Relative: 7 %
Lymphs Abs: 0.7 10*3/uL (ref 0.7–4.0)
MCH: 30.8 pg (ref 26.0–34.0)
MCHC: 32.6 g/dL (ref 30.0–36.0)
MCV: 94.6 fL (ref 80.0–100.0)
Monocytes Absolute: 0.5 10*3/uL (ref 0.1–1.0)
Monocytes Relative: 5 %
Neutro Abs: 8.7 10*3/uL — ABNORMAL HIGH (ref 1.7–7.7)
Neutrophils Relative %: 87 %
Platelets: 248 10*3/uL (ref 150–400)
RBC: 4.28 MIL/uL (ref 3.87–5.11)
RDW: 15.6 % — ABNORMAL HIGH (ref 11.5–15.5)
WBC: 10.1 10*3/uL (ref 4.0–10.5)
nRBC: 0 % (ref 0.0–0.2)

## 2024-04-08 LAB — TYPE AND SCREEN
ABO/RH(D): A POS
Antibody Screen: NEGATIVE

## 2024-04-08 SURGERY — EMBOLECTOMY
Anesthesia: General | Site: Groin | Laterality: Left

## 2024-04-08 MED ORDER — ROCURONIUM BROMIDE 10 MG/ML (PF) SYRINGE
PREFILLED_SYRINGE | INTRAVENOUS | Status: DC | PRN
  Administered 2024-04-08: 40 mg via INTRAVENOUS

## 2024-04-08 MED ORDER — 0.9 % SODIUM CHLORIDE (POUR BTL) OPTIME
TOPICAL | Status: DC | PRN
  Administered 2024-04-08: 2000 mL

## 2024-04-08 MED ORDER — SODIUM CHLORIDE 0.9% FLUSH
3.0000 mL | Freq: Two times a day (BID) | INTRAVENOUS | Status: DC
  Administered 2024-04-09 – 2024-04-12 (×6): 3 mL via INTRAVENOUS

## 2024-04-08 MED ORDER — CEFAZOLIN SODIUM-DEXTROSE 2-3 GM-%(50ML) IV SOLR
INTRAVENOUS | Status: DC | PRN
  Administered 2024-04-08: 2 g via INTRAVENOUS

## 2024-04-08 MED ORDER — FENTANYL CITRATE (PF) 250 MCG/5ML IJ SOLN
INTRAMUSCULAR | Status: AC
  Filled 2024-04-08: qty 5

## 2024-04-08 MED ORDER — PHENYLEPHRINE 80 MCG/ML (10ML) SYRINGE FOR IV PUSH (FOR BLOOD PRESSURE SUPPORT): PREFILLED_SYRINGE | INTRAVENOUS | Status: DC | PRN

## 2024-04-08 MED ORDER — SUCCINYLCHOLINE CHLORIDE 200 MG/10ML IV SOSY
PREFILLED_SYRINGE | INTRAVENOUS | Status: AC
  Filled 2024-04-08: qty 10

## 2024-04-08 MED ORDER — PHENYLEPHRINE 80 MCG/ML (10ML) SYRINGE FOR IV PUSH (FOR BLOOD PRESSURE SUPPORT)
PREFILLED_SYRINGE | INTRAVENOUS | Status: AC
  Filled 2024-04-08: qty 10

## 2024-04-08 MED ORDER — ROCURONIUM BROMIDE 10 MG/ML (PF) SYRINGE
PREFILLED_SYRINGE | INTRAVENOUS | Status: AC
  Filled 2024-04-08: qty 10

## 2024-04-08 MED ORDER — HEPARIN (PORCINE) 25000 UT/250ML-% IV SOLN
1100.0000 [IU]/h | INTRAVENOUS | Status: DC
  Administered 2024-04-08: 1100 [IU]/h via INTRAVENOUS
  Filled 2024-04-08: qty 250

## 2024-04-08 MED ORDER — HEPARIN SODIUM (PORCINE) 1000 UNIT/ML IJ SOLN
INTRAMUSCULAR | Status: DC | PRN
  Administered 2024-04-08: 2000 [IU] via INTRAVENOUS

## 2024-04-08 MED ORDER — DEXAMETHASONE SODIUM PHOSPHATE 10 MG/ML IJ SOLN
INTRAMUSCULAR | Status: AC
  Filled 2024-04-08: qty 1

## 2024-04-08 MED ORDER — DEXAMETHASONE SODIUM PHOSPHATE 10 MG/ML IJ SOLN
INTRAMUSCULAR | Status: DC | PRN
  Administered 2024-04-08: 4 mg via INTRAVENOUS

## 2024-04-08 MED ORDER — ALBUMIN HUMAN 5 % IV SOLN: INTRAVENOUS | Status: DC | PRN

## 2024-04-08 MED ORDER — HEPARIN 6000 UNIT IRRIGATION SOLUTION
Status: DC | PRN
  Administered 2024-04-08: 1

## 2024-04-08 MED ORDER — ONDANSETRON HCL 4 MG/2ML IJ SOLN: 4.0000 mg | Freq: Four times a day (QID) | INTRAMUSCULAR | Status: DC | PRN

## 2024-04-08 MED ORDER — LACTATED RINGERS IV SOLN: INTRAVENOUS | Status: DC | PRN

## 2024-04-08 MED ORDER — HEPARIN SODIUM (PORCINE) 1000 UNIT/ML IJ SOLN
INTRAMUSCULAR | Status: AC
  Filled 2024-04-08: qty 10

## 2024-04-08 MED ORDER — ACETAMINOPHEN 10 MG/ML IV SOLN
INTRAVENOUS | Status: AC
  Filled 2024-04-08: qty 100

## 2024-04-08 MED ORDER — HEPARIN BOLUS VIA INFUSION
4000.0000 [IU] | Freq: Once | INTRAVENOUS | Status: AC
  Administered 2024-04-08: 4000 [IU] via INTRAVENOUS
  Filled 2024-04-08: qty 4000

## 2024-04-08 MED ORDER — PHENYLEPHRINE HCL-NACL 20-0.9 MG/250ML-% IV SOLN
INTRAVENOUS | Status: DC | PRN
  Administered 2024-04-08: 20 ug/min via INTRAVENOUS

## 2024-04-08 MED ORDER — ACETAMINOPHEN 325 MG PO TABS: 650.0000 mg | ORAL_TABLET | Freq: Four times a day (QID) | ORAL | Status: DC | PRN

## 2024-04-08 MED ORDER — LIDOCAINE 2% (20 MG/ML) 5 ML SYRINGE
INTRAMUSCULAR | Status: DC | PRN
  Administered 2024-04-08: 25 mg via INTRAVENOUS

## 2024-04-08 MED ORDER — PROPOFOL 10 MG/ML IV BOLUS
INTRAVENOUS | Status: AC
  Filled 2024-04-08: qty 20

## 2024-04-08 MED ORDER — FENTANYL CITRATE (PF) 250 MCG/5ML IJ SOLN
INTRAMUSCULAR | Status: DC | PRN
  Administered 2024-04-08: 25 ug via INTRAVENOUS
  Administered 2024-04-08: 75 ug via INTRAVENOUS
  Administered 2024-04-08: 25 ug via INTRAVENOUS

## 2024-04-08 MED ORDER — PROPOFOL 10 MG/ML IV BOLUS
INTRAVENOUS | Status: DC | PRN
  Administered 2024-04-08: 80 mg via INTRAVENOUS

## 2024-04-08 MED ORDER — IOHEXOL 350 MG/ML SOLN
100.0000 mL | Freq: Once | INTRAVENOUS | Status: AC | PRN
  Administered 2024-04-08: 100 mL via INTRAVENOUS

## 2024-04-08 MED ORDER — HYDROMORPHONE HCL 1 MG/ML IJ SOLN
0.5000 mg | INTRAMUSCULAR | Status: DC | PRN
  Administered 2024-04-10 – 2024-04-11 (×2): 1 mg via INTRAVENOUS
  Administered 2024-04-11: 0.5 mg via INTRAVENOUS
  Administered 2024-04-11: 1 mg via INTRAVENOUS
  Filled 2024-04-08 (×4): qty 1

## 2024-04-08 MED ORDER — DEXTROSE IN LACTATED RINGERS 5 % IV SOLN: INTRAVENOUS | Status: DC

## 2024-04-08 MED ORDER — ACETAMINOPHEN 650 MG RE SUPP: 650.0000 mg | Freq: Four times a day (QID) | RECTAL | Status: DC | PRN

## 2024-04-08 MED ORDER — CEFAZOLIN SODIUM 1 G IJ SOLR
INTRAMUSCULAR | Status: AC
  Filled 2024-04-08: qty 50

## 2024-04-08 MED ORDER — ONDANSETRON HCL 4 MG PO TABS: 4.0000 mg | ORAL_TABLET | Freq: Four times a day (QID) | ORAL | Status: DC | PRN

## 2024-04-08 MED ORDER — HEPARIN 6000 UNIT IRRIGATION SOLUTION
Status: AC
  Filled 2024-04-08: qty 500

## 2024-04-08 MED ORDER — LIDOCAINE 2% (20 MG/ML) 5 ML SYRINGE
INTRAMUSCULAR | Status: AC
  Filled 2024-04-08: qty 5

## 2024-04-08 MED ORDER — SUCCINYLCHOLINE CHLORIDE 200 MG/10ML IV SOSY
PREFILLED_SYRINGE | INTRAVENOUS | Status: DC | PRN
  Administered 2024-04-08: 140 mg via INTRAVENOUS

## 2024-04-08 SURGICAL SUPPLY — 45 items
BAG COUNTER SPONGE SURGICOUNT (BAG) ×1 IMPLANT
BANDAGE ESMARK 6X9 LF (GAUZE/BANDAGES/DRESSINGS) IMPLANT
CANISTER SUCT 3000ML PPV (MISCELLANEOUS) ×1 IMPLANT
CATH EMB 2FR 60 (CATHETERS) IMPLANT
CATH EMB 3FR 80 (CATHETERS) IMPLANT
CATH EMB 4FR 80 (CATHETERS) IMPLANT
CATH EMB 5FR 80CM (CATHETERS) IMPLANT
CLIP APPLIE 9.375 SM OPEN (CLIP) ×1 IMPLANT
CLIP TI MEDIUM 24 (CLIP) ×1 IMPLANT
CLIP TI MEDIUM 6 (CLIP) ×1 IMPLANT
CLIP TI WIDE RED SMALL 24 (CLIP) ×1 IMPLANT
CLIP TI WIDE RED SMALL 6 (CLIP) ×1 IMPLANT
CUFF TOURN SGL QUICK 18X4 (TOURNIQUET CUFF) IMPLANT
CUFF TOURN SGL QUICK 42 (TOURNIQUET CUFF) IMPLANT
CUFF TRNQT CYL 24X4X16.5-23 (TOURNIQUET CUFF) IMPLANT
CUFF TRNQT CYL 34X4.125X (TOURNIQUET CUFF) IMPLANT
DERMABOND ADVANCED .7 DNX6 (GAUZE/BANDAGES/DRESSINGS) IMPLANT
DRAIN CHANNEL 15F RND FF W/TCR (WOUND CARE) IMPLANT
DRAPE X-RAY CASS 24X20 (DRAPES) IMPLANT
ELECTRODE REM PT RTRN 9FT ADLT (ELECTROSURGICAL) ×1 IMPLANT
EVACUATOR SILICONE 100CC (DRAIN) IMPLANT
GLOVE BIOGEL PI IND STRL 8 (GLOVE) ×1 IMPLANT
GOWN STRL REUS W/ TWL LRG LVL3 (GOWN DISPOSABLE) ×2 IMPLANT
GOWN STRL REUS W/TWL 2XL LVL3 (GOWN DISPOSABLE) ×2 IMPLANT
KIT BASIN OR (CUSTOM PROCEDURE TRAY) ×1 IMPLANT
KIT TURNOVER KIT B (KITS) ×1 IMPLANT
LOOP VESSEL MINI RED (MISCELLANEOUS) IMPLANT
NS IRRIG 1000ML POUR BTL (IV SOLUTION) ×2 IMPLANT
PACK PERIPHERAL VASCULAR (CUSTOM PROCEDURE TRAY) ×1 IMPLANT
PAD ARMBOARD POSITIONER FOAM (MISCELLANEOUS) ×2 IMPLANT
SET COLLECT BLD 21X3/4 12 (NEEDLE) IMPLANT
STAPLER VISISTAT (STAPLE) IMPLANT
STOPCOCK 4 WAY LG BORE MALE ST (IV SETS) IMPLANT
SUT MNCRL AB 4-0 PS2 18 (SUTURE) ×1 IMPLANT
SUT PROLENE 5 0 C 1 24 (SUTURE) ×1 IMPLANT
SUT PROLENE 6 0 BV (SUTURE) ×1 IMPLANT
SUT SILK 2 0 PERMA HAND 18 BK (SUTURE) IMPLANT
SUT VIC AB 2-0 CT1 TAPERPNT 27 (SUTURE) ×1 IMPLANT
SUT VIC AB 3-0 SH 27X BRD (SUTURE) ×1 IMPLANT
SYR 3ML LL SCALE MARK (SYRINGE) ×1 IMPLANT
TOWEL GREEN STERILE (TOWEL DISPOSABLE) ×1 IMPLANT
TRAY FOLEY MTR SLVR 16FR STAT (SET/KITS/TRAYS/PACK) ×1 IMPLANT
TUBING EXTENTION W/L.L. (IV SETS) IMPLANT
UNDERPAD 30X36 HEAVY ABSORB (UNDERPADS AND DIAPERS) ×1 IMPLANT
WATER STERILE IRR 1000ML POUR (IV SOLUTION) ×1 IMPLANT

## 2024-04-08 NOTE — ED Notes (Signed)
 Patient transported to CT

## 2024-04-08 NOTE — Anesthesia Procedure Notes (Signed)
 Procedure Name: Intubation Date/Time: 04/08/2024 11:10 PM  Performed by: Shauna Del, CRNAPre-anesthesia Checklist: Patient identified, Timeout performed, Emergency Drugs available, Suction available and Patient being monitored Patient Re-evaluated:Patient Re-evaluated prior to induction Oxygen Delivery Method: Circle system utilized and Supernova nasal CPAP Preoxygenation: Pre-oxygenation with 100% oxygen Induction Type: IV induction and Rapid sequence Laryngoscope Size: Mac and 3 Grade View: Grade I Tube type: Oral Tube size: 7.0 mm Number of attempts: 1 Airway Equipment and Method: Stylet Placement Confirmation: breath sounds checked- equal and bilateral, CO2 detector, positive ETCO2 and ETT inserted through vocal cords under direct vision Secured at: 22 cm Tube secured with: Tape Dental Injury: Teeth and Oropharynx as per pre-operative assessment

## 2024-04-08 NOTE — ED Triage Notes (Addendum)
 Pt BIB EMS from [facility] for left leg pain. Per ems,left foot is pulseless, white, and cold. Leg last seen normal was 1500 today. Pt on hospice for heart failure.

## 2024-04-08 NOTE — Consult Note (Signed)
 Hospital Consult    Reason for Consult: Left lower extremity acute limb ischemia Requesting Physician: ED MRN #:  [id number]  History of Present Illness: This is a 89 y.o. female with history outlined below.  Currently on comfort care, however presented to the ED at the request of her hospice nurse with left lower extremity acute limb ischemia.  Patient was accompanied by her daughter and son-in-law who are bedside. Maicie was oriented.  Noted significant pain in the left leg.  Sensory deficits.  Minor motor weakness.  Past Medical History:  Diagnosis Date   Cancer [facility])    endometrial ca   Chronic diastolic CHF (congestive heart failure) (HCC)    Chronic edema    CKD (chronic kidney disease), stage III (HCC)    Dyslipidemia    Fracture of left humerus    History of cardiovascular stress test    Lexiscan  Myoview  (06/2014): No ischemia or scar, not gated, low risk   Hx of echocardiogram    Echo (05/02/14): EF 60% to 65%. No regional wall motion abnormalities. Mild AI, mildly dilated aortic root (37 mm), trivial MR, trivial TR   Hyperlipidemia    Hypertension    Lipoma of skin    multiple   Osteopenia    PAF (paroxysmal atrial fibrillation) (HCC)    failed DCCV 05/2014 >> Flecainide  started >> DCCV 7/15 sucessful;  f/u ETT neg for pro-arrhythmia >> recurrent AF/AFL >> Flecainide  inc to 100 bid with repeat DCCV 9/15   Vitamin D  deficiency 09/25/2019    Past Surgical History:  Procedure Laterality Date   CARDIOVERSION N/A 06/05/2014   Procedure: CARDIOVERSION;  Surgeon: Jacqueline Matsu, MD;  Location: [facility] ENDOSCOPY;  Service: Cardiovascular;  Laterality: N/A;   CARDIOVERSION N/A 07/11/2014   Procedure: CARDIOVERSION;  Surgeon: Jacqueline Matsu, MD;  Location: [facility] ENDOSCOPY;  Service: Cardiovascular;  Laterality: N/A;   CARDIOVERSION N/A 09/04/2014   Procedure: CARDIOVERSION;  Surgeon: Jacqueline Matsu, MD;  Location: [facility] ENDOSCOPY;  Service: Cardiovascular;  Laterality: N/A;    CARDIOVERSION N/A 05/04/2022   Procedure: CARDIOVERSION;  Surgeon: Wendie Hamburg, MD;  Location: [facility] ENDOSCOPY;  Service: Cardiovascular;  Laterality: N/A;   COLONOSCOPY  2008   COLONOSCOPY N/A 05/20/2015   Procedure: COLONOSCOPY;  Surgeon: Lanita Pitman, MD;  Location: [facility] ENDOSCOPY;  Service: Endoscopy;  Laterality: N/A;   FEMUR IM NAIL Right 09/24/2019   Procedure: INTRAMEDULLARY (IM) NAIL FEMORAL;  Surgeon: Hardy Lia, MD;  Location: [facility] OR;  Service: Orthopedics;  Laterality: Right;   IR KYPHO EA ADDL LEVEL THORACIC OR LUMBAR  04/12/2019   IR KYPHO THORACIC WITH BONE BIOPSY  04/12/2019   ROBOTIC ASSISTED SUPRACERVICAL HYSTERECTOMY WITH BILATERAL SALPINGO OOPHERECTOMY  11/06/2012   and bilateral pelvic lymph node dissection    Allergies  Allergen Reactions   Penicillins Itching and Other (See Comments)    Dizziness to the patient of passing out- told by MD to "never take this again" Did it involve swelling of the face/tongue/throat, SOB, or low BP? No Did it involve sudden or severe rash/hives, skin peeling, or any reaction on the inside of your mouth or nose? No Did you need to seek medical attention at a hospital or doctor's office? No When did it last happen? "a long time ago"    If all above answers are "NO", may proceed with cephalosporin use.     Prior to Admission medications   Medication Sig Start Date End Date Taking? Authorizing Provider  ACETAMINOPHEN  8 HOUR 650 MG CR  tablet Take 650 mg by mouth every 8 (eight) hours as needed for fever or pain. 10/27/23   [provider]  apixaban  (ELIQUIS ) 2.5 MG TABS tablet Take 1 tablet (2.5 mg total) by mouth 2 (two) times daily. 11/21/23   Deforest Fast, MD  atorvastatin  (LIPITOR) 10 MG tablet Take 1 tablet (10 mg total) by mouth at bedtime. 04/26/21   Jacqueline Matsu, MD  Cholecalciferol  (VITAMIN D ) 125 MCG (5000 UT) CAPS Take 5,000 Units by mouth daily.    [provider]  diltiazem  (CARDIZEM  CD) 300 MG 24  hr capsule Take 1 capsule (300 mg total) by mouth daily. 11/21/23   Deforest Fast, MD  furosemide  (LASIX ) 40 MG tablet Take 1 tablet (40 mg total) by mouth every other day. 11/21/23   Deforest Fast, MD  letrozole  (FEMARA ) 2.5 MG tablet Take 2.5 mg by mouth daily.    [provider]  loperamide (IMODIUM A-D) 2 MG tablet Take 2 mg by mouth every 6 (six) hours as needed for diarrhea or loose stools. Do not exceed 8mg  in 24 hours.    [provider]  metoprolol  tartrate (LOPRESSOR ) 25 MG tablet Take 1 tablet (25 mg total) by mouth 2 (two) times daily. 11/21/23   Deforest Fast, MD  Multiple Vitamin (DAILY VITE PO) Take 1 tablet by mouth daily.    [provider]  omeprazole (PRILOSEC) 20 MG capsule Take 20 mg by mouth daily. 04/14/21   [provider]  potassium chloride  SA (KLOR-CON  M) 20 MEQ tablet Take 1 tablet (20 mEq total) by mouth every other day. With Lasix  11/21/23   Deforest Fast, MD  vitamin B-12 (CYANOCOBALAMIN ) 250 MCG tablet Take 250 mcg by mouth every other day.    [provider]  vitamin C  (VITAMIN C ) 500 MG tablet Take 1 tablet (500 mg total) by mouth daily. 09/28/19   Lesa Rape, MD    Social History   Socioeconomic History   Marital status: Widowed    Spouse name: Not on file   Number of children: Not on file   Years of education: Not on file   Highest education level: Not on file  Occupational History   Not on file  Tobacco Use   Smoking status: Never    Passive exposure: Past   Smokeless tobacco: Never  Vaping Use   Vaping status: Never Used  Substance and Sexual Activity   Alcohol use: No   Drug use: No   Sexual activity: Not on file  Other Topics Concern   Not on file  Social History Narrative   ** Merged History Encounter **       Social Drivers of Health   Financial Resource Strain: Low Risk  (09/27/2019)   Overall Financial Resource Strain (CARDIA)    Difficulty of Paying Living Expenses: Not hard at  all  Food Insecurity: No Food Insecurity (11/09/2023)   Hunger Vital Sign    Worried About Running Out of Food in the Last Year: Never true    Ran Out of Food in the Last Year: Never true  Transportation Needs: No Transportation Needs (11/09/2023)   PRAPARE - Administrator, Civil Service (Medical): No    Lack of Transportation (Non-Medical): No  Physical Activity: Inactive (09/27/2019)   Exercise Vital Sign    Days of Exercise per Week: 0 days    Minutes of Exercise per Session: 0 min  Stress: No Stress Concern Present (09/27/2019)   [employer] of Occupational  Health - Occupational Stress Questionnaire    Feeling of Stress : Only a little  Social Connections: Unknown (04/15/2022)   Received from [facility]   Social Network    Social Network: Not on file  Intimate Partner Violence: Not At Risk (11/09/2023)   Humiliation, Afraid, Rape, and Kick questionnaire    Fear of Current or Ex-Partner: No    Emotionally Abused: No    Physically Abused: No    Sexually Abused: No    Family History  Problem Relation Age of Onset   Cancer Father 71       metastatic oropharyngeal ca   Heart attack Mother    Coronary artery disease Mother     ROS: Otherwise negative unless mentioned in HPI  Physical Examination  Vitals:   04/08/24 2038  BP: (!) 155/87  Pulse: 96  Resp: 18  Temp: 97.6 F (36.4 C)  SpO2: 100%   Body mass index is 24.98 kg/m.  General:  WDWN in NAD Gait: Not observed HENT: WNL, normocephalic Pulmonary: normal non-labored breathing, without Rales, rhonchi,  wheezing Cardiac: regular Abdomen:  soft, NT/ND, no masses Skin: without rashes Vascular Exam/Pulses: No signals in the left lower extremity.  Palpable femoral arteries bilaterally Extremities: without ischemic changes, without Gangrene , without cellulitis; without open wounds;  Musculoskeletal: no muscle wasting or atrophy  Neurologic: A&O X 3;  No focal weakness or  paresthesias are detected; speech is fluent/normal Psychiatric:  The pt has Normal affect. Lymph:  Unremarkable  CBC    Component Value Date/Time   WBC 10.1 04/08/2024 2039   RBC 4.28 04/08/2024 2039   HGB 13.2 04/08/2024 2039   HGB 11.6 09/19/2023 1635   HCT 40.5 04/08/2024 2039   HCT 36.9 09/19/2023 1635   PLT 248 04/08/2024 2039   PLT 293 09/19/2023 1635   MCV 94.6 04/08/2024 2039   MCV 89 09/19/2023 1635   MCH 30.8 04/08/2024 2039   MCHC 32.6 04/08/2024 2039   RDW 15.6 (H) 04/08/2024 2039   RDW 14.4 09/19/2023 1635   LYMPHSABS 0.7 04/08/2024 2039   LYMPHSABS 0.6 (L) 09/05/2017 1316   MONOABS 0.5 04/08/2024 2039   EOSABS 0.0 04/08/2024 2039   EOSABS 0.0 09/05/2017 1316   BASOSABS 0.0 04/08/2024 2039   BASOSABS 0.0 09/05/2017 1316    BMET    Component Value Date/Time   NA 139 04/08/2024 2039   NA 144 09/19/2023 1635   K 3.7 04/08/2024 2039   CL 102 04/08/2024 2039   CO2 23 04/08/2024 2039   GLUCOSE 175 (H) 04/08/2024 2039   BUN 34 (H) 04/08/2024 2039   BUN 22 09/19/2023 1635   CREATININE 1.44 (H) 04/08/2024 2039   CREATININE 0.82 03/21/2016 0915   CALCIUM  9.2 04/08/2024 2039   CALCIUM  8.0 (L) 09/26/2019 0654   GFRNONAA 35 (L) 04/08/2024 2039   GFRAA 31 (L) 10/13/2020 1420    COAGS: Lab Results  Component Value Date   INR 1.5 (H) 03/07/2022   INR 1.2 11/29/2021   INR 1.0 04/12/2019       ASSESSMENT/PLAN: This is a 89 y.o. female currently comfort care who presented with acute limb ischemia in the left lower extremity from A-fib embolus.  I do long conversation with Nellie Banas and her children regarding left lower extremity acute limb ischemia, and the natural history associated.  Imaging was reviewed.  There is occlusion of the profunda, and superficial femoral artery.  No reconstitution distally.  Unfortunately, this pathology comes with significant pain,  followed by numbness and motor weakness.  The leg will end up dying, which will inevitably lead to  death after several weeks from either myonecrosis causing kidney failure versus infection from an ischemic wound.  Unfortunately, at this juncture, I could not even offer a palliative above-knee amputation as she does not have the blood flow to heal this.  This puts all of us  at an impasse.  With her comorbidities, and comfort care status, if she were able to heal an AKA, I would not offer any other surgery, but would offer a palliative above-knee amputation.  Being that she needs inflow, her family and I discussed left lower extremity embolectomy from a femoral approach with no plans to pursue below-knee popliteal embolectomy should the need arise.  I am hoping that femoral bullectomy will result in clot retrieval, but if it does not, my hope is that restoring flow to the profunda would at least allow for a palliative above-knee amputation.  Charliann and her family are well aware that she may not survive this admission.  I offered continued medical management versus embolectomy in an effort to salvage the left leg.  After discussing the risks and benefits of both, her and her daughter elected to pursue embolectomy.    Kayla Part MD [facility] [phone] 04/08/2024  10:14 PM

## 2024-04-08 NOTE — Progress Notes (Signed)
 ANTICOAGULATION CONSULT NOTE  Pharmacy Consult for Heparin Indication:  vascular occlusion  Allergies  Allergen Reactions   Penicillins Itching and Other (See Comments)    Dizziness to the patient of passing out- told by MD to "never take this again" Did it involve swelling of the face/tongue/throat, SOB, or low BP? No Did it involve sudden or severe rash/hives, skin peeling, or any reaction on the inside of your mouth or nose? No Did you need to seek medical attention at a hospital or doctor's office? No When did it last happen? "a long time ago"    If all above answers are "NO", may proceed with cephalosporin use.     Patient Measurements: Height: 5\' 4"  (162.6 cm) Weight: 66 kg (145 lb 8.1 oz) IBW/kg (Calculated) : 54.7 Heparin Dosing Weight: 66 kg  Vital Signs: Temp: 97.6 F (36.4 C) (04/28 2038) BP: 155/87 (04/28 2038) Pulse Rate: 96 (04/28 2038)  Labs: Recent Labs    04/08/24 2039  HGB 13.2  HCT 40.5  PLT 248    CrCl cannot be calculated (Patient's most recent lab result is older than the maximum 21 days allowed.).   Medical History: Past Medical History:  Diagnosis Date   Cancer [facility])    endometrial ca   Chronic diastolic CHF (congestive heart failure) (HCC)    Chronic edema    CKD (chronic kidney disease), stage III (HCC)    Dyslipidemia    Fracture of left humerus    History of cardiovascular stress test    Lexiscan  Myoview  (06/2014): No ischemia or scar, not gated, low risk   Hx of echocardiogram    Echo (05/02/14): EF 60% to 65%. No regional wall motion abnormalities. Mild AI, mildly dilated aortic root (37 mm), trivial MR, trivial TR   Hyperlipidemia    Hypertension    Lipoma of skin    multiple   Osteopenia    PAF (paroxysmal atrial fibrillation) (HCC)    failed DCCV 05/2014 >> Flecainide  started >> DCCV 7/15 sucessful;  f/u ETT neg for pro-arrhythmia >> recurrent AF/AFL >> Flecainide  inc to 100 bid with repeat DCCV 9/15   Vitamin D  deficiency  09/25/2019    Medications:  (Not in a hospital admission)  Scheduled:  Infusions:  PRN:   Assessment: 89 yof with a history of HTN, HLD, AF no longer on anticoagulation, CKD. Patient is presenting from facility for left leg pain. EMS reports left foot is pulseless, white, and cold. Heparin per pharmacy consult placed for   vascular occlusion .  CT imaging pending  Patient is not on anticoagulation prior to arrival according to medication list from [facility].  Hgb 13.2; plt 248  Goal of Therapy:  Heparin level 0.3-0.7 units/ml Monitor platelets by anticoagulation protocol: Yes   Plan:  Give IV heparin 4000 units bolus x 1 Start heparin infusion at 1100 units/hr Check anti-Xa level in 8 hours and daily while on heparin Continue to monitor H&H and platelets  Dionicio Fray, PharmD, BCPS 04/08/2024 9:06 PM ED Clinical Pharmacist -  [phone]

## 2024-04-08 NOTE — H&P (Signed)
 History and Physical    Christina Morgan ZOX:[id number] DOB: 1933/10/03 DOA: 04/08/2024  PCP: Cole Daubs, [facility]   Patient coming from: ALF   Chief Complaint:  Chief Complaint  Patient presents with   Leg Pain   ED TRIAGE note:   Pt BIB EMS from [facility] for left leg pain. Per ems,left foot is pulseless, white, and cold. Leg last seen normal was 1500 today. Pt on hospice for heart failure.       HPI:  Christina Morgan is a 89 y.o. female with medical history significant of diastolic heart failure with preserved EF 55 to 60%,  CKD stage IIIb, paroxysmal atrial fibrillation on Eliquis , essential hypertension, GERD, hyperlipidemia and cardiorenal syndrome who has been presented to emergency department complaining of left-sided leg pain.  EMS reported left foot is pulseless white and cold.  Patient reported left pain started around around 3 to 4 PM in the afternoon. Per chart review patient was previously on Eliquis  due to atrial fibrillation however recently has been taken off anticoagulation as patient has been in charge to hospice.  Family at bedside reported that all medication has been continued except Eliquis . Patient reported that she does have a wish that she does not want to be transported to hospital after the hospice has been initiated however hospice nurse has been stated that in the acute event patient has been authorized to transfer to ED for evaluation  Vascular surgeon Dr. Rosalva Comber has been evaluated as patient found left lower extremity arteries no opacification.  Given patient's advanced age and multiple comorbidities with currently hospice status it would be more appropriate for palliative and comfort care measure however possible to go undergo a palliative above-knee amputation.  After long discussion with patient's family with vascular surgeon patient's daughter elected to pursue embolectomy. Patient has been transferred to PACU for embolectomy  tonight.   At presentation to ED patient blood pressure is 155/87 otherwise hemodynamically stable. BMP showing creatinine 1.44 and GFR 35.  Renal function at baseline.  No other derangement. CBC unremarkable.  CT angiogram of abdominal aorta and bilateral lower extremity showed IMPRESSION: 1. 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 The superficial, profunda, and popliteal arteries are not opacified. No opacification of the lower extremity arteries. 2. New marked left hydroureteronephrosis. Soft tissue thickening at the left UVJ/bladder wall is partially obscured by streak artifact and is suspicious for occlusion secondary to infiltrative malignancy. Urology consult recommended. 3. Dilated common bile duct is new since 03/06/2022. Recommend correlation with LFTs. 4. Small volume free fluid in the pelvis. 5. Aortic Atherosclerosis (ICD10-I70.0).  In the ED heparin infusion has been initiated.  Hospitalist has been consulted for further management acute left-sided lower extremity ischemia alongside with vascular surgery.  Significant labs in the ED: Lab Orders         Basic metabolic panel         CBC with Differential         Heparin level (unfractionated)         Comprehensive metabolic panel         CBC         Protime-INR         Heparin level (unfractionated)         I-stat chem 8, ED       Review of Systems:  Review of Systems  Constitutional:  Negative for chills, fever, malaise/fatigue and weight loss.  Respiratory:  Negative for cough, hemoptysis and shortness of breath.   Cardiovascular:  Positive for claudication. Negative for chest pain, palpitations and leg swelling.  Gastrointestinal:  Negative for abdominal pain, heartburn, nausea and vomiting.  Musculoskeletal:  Negative for back pain, falls, joint pain, myalgias and neck pain.       Left-sided lower extremity pain  Neurological:   Negative for dizziness and headaches.    Past Medical History:  Diagnosis Date   Cancer [facility])    endometrial ca   Chronic diastolic CHF (congestive heart failure) (HCC)    Chronic edema    CKD (chronic kidney disease), stage III (HCC)    Dyslipidemia    Fracture of left humerus    History of cardiovascular stress test    Lexiscan  Myoview  (06/2014): No ischemia or scar, not gated, low risk   Hx of echocardiogram    Echo (05/02/14): EF 60% to 65%. No regional wall motion abnormalities. Mild AI, mildly dilated aortic root (37 mm), trivial MR, trivial TR   Hyperlipidemia    Hypertension    Lipoma of skin    multiple   Osteopenia    PAF (paroxysmal atrial fibrillation) (HCC)    failed DCCV 05/2014 >> Flecainide  started >> DCCV 7/15 sucessful;  f/u ETT neg for pro-arrhythmia >> recurrent AF/AFL >> Flecainide  inc to 100 bid with repeat DCCV 9/15   Vitamin D  deficiency 09/25/2019    Past Surgical History:  Procedure Laterality Date   CARDIOVERSION N/A 06/05/2014   Procedure: CARDIOVERSION;  Surgeon: Jacqueline Matsu, MD;  Location: [facility] ENDOSCOPY;  Service: Cardiovascular;  Laterality: N/A;   CARDIOVERSION N/A 07/11/2014   Procedure: CARDIOVERSION;  Surgeon: Jacqueline Matsu, MD;  Location: [facility] ENDOSCOPY;  Service: Cardiovascular;  Laterality: N/A;   CARDIOVERSION N/A 09/04/2014   Procedure: CARDIOVERSION;  Surgeon: Jacqueline Matsu, MD;  Location: [facility] ENDOSCOPY;  Service: Cardiovascular;  Laterality: N/A;   CARDIOVERSION N/A 05/04/2022   Procedure: CARDIOVERSION;  Surgeon: Wendie Hamburg, MD;  Location: [facility] ENDOSCOPY;  Service: Cardiovascular;  Laterality: N/A;   COLONOSCOPY  2008   COLONOSCOPY N/A 05/20/2015   Procedure: COLONOSCOPY;  Surgeon: Lanita Pitman, MD;  Location: [facility] ENDOSCOPY;  Service: Endoscopy;  Laterality: N/A;   FEMUR IM NAIL Right 09/24/2019   Procedure: INTRAMEDULLARY (IM) NAIL FEMORAL;  Surgeon: Hardy Lia, MD;  Location: [facility] OR;  Service: Orthopedics;  Laterality: Right;    IR KYPHO EA ADDL LEVEL THORACIC OR LUMBAR  04/12/2019   IR KYPHO THORACIC WITH BONE BIOPSY  04/12/2019   ROBOTIC ASSISTED SUPRACERVICAL HYSTERECTOMY WITH BILATERAL SALPINGO OOPHERECTOMY  11/06/2012   and bilateral pelvic lymph node dissection     reports that she has never smoked. She has been exposed to tobacco smoke. She has never used smokeless tobacco. She reports that she does not drink alcohol and does not use drugs.  Allergies  Allergen Reactions   Penicillins Itching and Other (See Comments)    Dizziness to the patient of passing out- told by MD to "never take this again" Did it involve swelling of the face/tongue/throat, SOB, or low BP? No Did it involve sudden or severe rash/hives, skin peeling, or any reaction on the inside of your mouth or nose? No Did you need to seek medical attention at a hospital or doctor's office? No When did it last happen? "a long time ago"    If all above answers are "NO", may proceed with cephalosporin use.     Family History  Problem Relation Age of Onset  Cancer Father 67       metastatic oropharyngeal ca   Heart attack Mother    Coronary artery disease Mother     Prior to Admission medications   Medication Sig Start Date End Date Taking? Authorizing Provider  ACETAMINOPHEN  8 HOUR 650 MG CR tablet Take 650 mg by mouth every 8 (eight) hours as needed for fever or pain. 10/27/23   [provider]  apixaban  (ELIQUIS ) 2.5 MG TABS tablet Take 1 tablet (2.5 mg total) by mouth 2 (two) times daily. 11/21/23   Deforest Fast, MD  atorvastatin  (LIPITOR) 10 MG tablet Take 1 tablet (10 mg total) by mouth at bedtime. 04/26/21   Jacqueline Matsu, MD  Cholecalciferol  (VITAMIN D ) 125 MCG (5000 UT) CAPS Take 5,000 Units by mouth daily.    [provider]  diltiazem  (CARDIZEM  CD) 300 MG 24 hr capsule Take 1 capsule (300 mg total) by mouth daily. 11/21/23   Deforest Fast, MD  furosemide  (LASIX ) 40 MG tablet Take 1 tablet (40 mg total) by  mouth every other day. 11/21/23   Deforest Fast, MD  letrozole  (FEMARA ) 2.5 MG tablet Take 2.5 mg by mouth daily.    [provider]  loperamide (IMODIUM A-D) 2 MG tablet Take 2 mg by mouth every 6 (six) hours as needed for diarrhea or loose stools. Do not exceed 8mg  in 24 hours.    [provider]  metoprolol  tartrate (LOPRESSOR ) 25 MG tablet Take 1 tablet (25 mg total) by mouth 2 (two) times daily. 11/21/23   Deforest Fast, MD  Multiple Vitamin (DAILY VITE PO) Take 1 tablet by mouth daily.    [provider]  omeprazole (PRILOSEC) 20 MG capsule Take 20 mg by mouth daily. 04/14/21   [provider]  potassium chloride  SA (KLOR-CON  M) 20 MEQ tablet Take 1 tablet (20 mEq total) by mouth every other day. With Lasix  11/21/23   Deforest Fast, MD  vitamin B-12 (CYANOCOBALAMIN ) 250 MCG tablet Take 250 mcg by mouth every other day.    [provider]  vitamin C  (VITAMIN C ) 500 MG tablet Take 1 tablet (500 mg total) by mouth daily. 09/28/19   Lesa Rape, MD     Physical Exam: Vitals:   04/09/24 0300 04/09/24 0315 04/09/24 0330 04/09/24 0405  BP: 124/75 119/69 125/73 138/75  Pulse: 83 86 85 80  Resp: 12 12 14 14   Temp:   98 F (36.7 C) 97.6 F (36.4 C)  TempSrc:    Oral  SpO2: 96% 91% 100% 100%  Weight:      Height:        Physical Exam Vitals and nursing note reviewed.  Constitutional:      Appearance: She is not ill-appearing.  HENT:     Mouth/Throat:     Mouth: Mucous membranes are moist.  Cardiovascular:     Rate and Rhythm: Normal rate. Rhythm irregular.     Pulses: Normal pulses.     Heart sounds: Normal heart sounds.  Pulmonary:     Effort: Pulmonary effort is normal.     Breath sounds: Normal breath sounds.  Abdominal:     Palpations: Abdomen is soft.  Musculoskeletal:     Cervical back: Neck supple.     Right lower leg: No edema.     Left lower leg: No edema.     Comments: Left-sided lower extremity pelvic appearance.   Unable to appreciate any peripheral pulse.  Skin:    Coloration: Skin is pale.  Neurological:     Mental Status: She is oriented to person, place, and time.  Psychiatric:        Mood and Affect: Mood normal.        Thought Content: Thought content normal.      Labs on Admission: I have personally reviewed following labs and imaging studies  CBC: Recent Labs  Lab 04/08/24 2039  WBC 10.1  NEUTROABS 8.7*  HGB 13.2  HCT 40.5  MCV 94.6  PLT 248   Basic Metabolic Panel: Recent Labs  Lab 04/08/24 2039  NA 139  K 3.7  CL 102  CO2 23  GLUCOSE 175*  BUN 34*  CREATININE 1.44*  CALCIUM  9.2   GFR: Estimated Creatinine Clearance: 24.8 mL/min (A) (by C-G formula based on SCr of 1.44 mg/dL (H)). Liver Function Tests: No results for input(s): "AST", "ALT", "ALKPHOS", "BILITOT", "PROT", "ALBUMIN" in the last 168 hours. No results for input(s): "LIPASE", "AMYLASE" in the last 168 hours. No results for input(s): "AMMONIA" in the last 168 hours. Coagulation Profile: No results for input(s): "INR", "PROTIME" in the last 168 hours. Cardiac Enzymes: No results for input(s): "CKTOTAL", "CKMB", "CKMBINDEX", "TROPONINI", "TROPONINIHS" in the last 168 hours. BNP (last 3 results) Recent Labs    11/08/23 1223 11/10/23 0244  BNP 871.9* 1,299.8*   HbA1C: No results for input(s): "HGBA1C" in the last 72 hours. CBG: No results for input(s): "GLUCAP" in the last 168 hours. Lipid Profile: No results for input(s): "CHOL", "HDL", "LDLCALC", "TRIG", "CHOLHDL", "LDLDIRECT" in the last 72 hours. Thyroid  Function Tests: No results for input(s): "TSH", "T4TOTAL", "FREET4", "T3FREE", "THYROIDAB" in the last 72 hours. Anemia Panel: No results for input(s): "VITAMINB12", "FOLATE", "FERRITIN", "TIBC", "IRON", "RETICCTPCT" in the last 72 hours. Urine analysis:    Component Value Date/Time   COLORURINE YELLOW 11/16/2023 1210   APPEARANCEUR CLOUDY (A) 11/16/2023 1210   LABSPEC 1.016 11/16/2023  1210   PHURINE 8.0 11/16/2023 1210   GLUCOSEU NEGATIVE 11/16/2023 1210   HGBUR SMALL (A) 11/16/2023 1210   BILIRUBINUR NEGATIVE 11/16/2023 1210   KETONESUR NEGATIVE 11/16/2023 1210   PROTEINUR 30 (A) 11/16/2023 1210   UROBILINOGEN 0.2 06/19/2014 2131   NITRITE POSITIVE (A) 11/16/2023 1210   LEUKOCYTESUR TRACE (A) 11/16/2023 1210    Radiological Exams on Admission: I have personally reviewed images CT Angio Aortobifemoral W and/or Wo Contrast Result Date: 04/08/2024 CLINICAL DATA:  Claudication/leg ischemia EXAM: CT ANGIOGRAPHY OF ABDOMINAL AORTA WITH ILIOFEMORAL RUNOFF TECHNIQUE: Multidetector CT imaging of the abdomen, pelvis and lower extremities was performed using the standard protocol during bolus administration of intravenous contrast. Multiplanar CT image reconstructions and MIPs were obtained to evaluate the vascular anatomy. RADIATION DOSE REDUCTION: This exam was performed according to the departmental dose-optimization program which includes automated exposure control, adjustment of the mA and/or kV according to patient size and/or use of iterative reconstruction technique. CONTRAST:  OMNIPAQUE  IOHEXOL  350 MG/ML SOLN COMPARISON:  CT abdomen pelvis 03/06/2022 FINDINGS: VASCULAR Aorta: Aortic atherosclerotic calcification without hemodynamically significant stenosis. No aneurysm or dissection. Celiac: No hemodynamically significant stenosis. No aneurysm or dissection. SMA: No hemodynamically significant stenosis. No aneurysm or dissection. Renals: No hemodynamically significant stenosis. No aneurysm or dissection. IMA: No hemodynamically significant stenosis. No aneurysm or dissection. RIGHT Lower Extremity Inflow: No hemodynamically significant stenosis. No aneurysm or dissection. Outflow: Common, superficial, and profunda femoral arteries are patent without aneurysm or dissection. Irregular atherosclerotic plaque in the popliteal artery causes up to moderate narrowing. Runoff: Poorly  assessed due to slow flow and  peripheral calcification. LEFT Lower Extremity Inflow: No hemodynamically significant stenosis. No aneurysm or dissection. Outflow: The common femoral artery is occluded at its bifurcation into the superficial and profunda femoral arteries which are also occluded. The popliteal artery is not opacified. Runoff: No opacification of the lower extremity arteries. Veins: No obvious venous abnormality within the limitations of this arterial phase study. Review of the MIP images confirms the above findings. NON-VASCULAR Lower chest: No acute abnormality. Hepatobiliary: Dilated common bile duct measuring 12 mm in diameter. No radiopaque stone. No focal hepatic lesion. Pancreas: Unremarkable. Spleen: Unremarkable. Adrenals/Urinary Tract: Stable adrenal glands. Delayed left nephrogram. New marked left hydroureteronephrosis. Soft tissue thickening at the left UVJ/bladder wall is partially obscured by streak artifact (circa series 5/image 177). No radiopaque stone. Stomach/Bowel: No bowel wall thickening or obstruction. Stomach and appendix are within normal limits. Lymphatic: No lymphadenopathy. Reproductive: Status post hysterectomy. No adnexal mass. The vaginal cuff is poorly evaluated due to streak artifact. Other: Small volume free fluid in the pelvis. No free intraperitoneal air. Musculoskeletal: IM rod and screw fixation right femur. No acute fracture. Chronic compression fractures of T9, L1, and L3. Vertebroplasty of T11 and T12. IMPRESSION: 1. 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 The superficial, profunda, and popliteal arteries are not opacified. No opacification of the lower extremity arteries. 2. New marked left hydroureteronephrosis. Soft tissue thickening at the left UVJ/bladder wall is partially obscured by streak artifact and is suspicious for occlusion secondary to infiltrative malignancy. Urology consult  recommended. 3. Dilated common bile duct is new since 03/06/2022. Recommend correlation with LFTs. 4. Small volume free fluid in the pelvis. 5. Aortic Atherosclerosis (ICD10-I70.0). Critical Value/emergent results were called by telephone at the time of interpretation on 04/08/2024 at 9:51 pm to provider MELANIE BELFI , who verbally acknowledged these results. Electronically Signed   By: Rozell Cornet M.D.   On: 04/08/2024 21:51     EKG: My personal interpretation of EKG shows: EKG showing atrial fibrillation rate controlled 86    Assessment/Plan: Principal Problem:   Acute lower limb ischemia Active Problems:   Paroxysmal atrial fibrillation (HCC)   CKD stage 3b, GFR 30-44 ml/min (HCC)   GERD (gastroesophageal reflux disease)   Hyperlipidemia   Essential hypertension   Chronic diastolic CHF (congestive heart failure) (HCC)    Assessment and Plan: Acute ischemia of the left lower extremity -Patient presented to emergency department complaining of left-sided lower extremity pain, paleness, numbness and weakness.  That started around 3 to 4 PM in the afternoon.  Per chart review patient has history of paroxysmal atrial fibrillation however recently patient has been transition to hospice and off Eliquis  for 3 to 4 months. - In the ED patient is hemodynamically stable. -CBC and CMP grossly unremarkable. - CT angio bilateral aortofemoral runoff showed cclusion of the left common femoral artery at its bifurcation into the superficial and profunda femoral arteries. The superficial, profunda, and popliteal arteries are not opacified. No opacification of the lower extremity arteries. -Vascular surgeon Dr. Rosalva Comber had a long conversation with patient daughter at the bedside.  Option would be palliative above-knee amputation and embolectomy.  After further discussion patient's daughter decided to pursue embolectomy.  Patient has been transferred to PACU. -In the ED patient has been started on  heparin drip. - As of now continue heparin drip, pain control with Dilaudid  as needed and starting D5LR as a maintenance fluid. -Need to reevaluate patient postop again if  unable to came off vent and in that case need to transfer to ICU. -Appreciate vascular surgery recommendation. Addendum - Eventually patient underwent endarterectomy. There is full procedure note on the chart documented by vascular surgeon Dr. Rosalva Comber. -Heparin has been restarted around 4 AM. - Patient tolerated procedure well currently hemodynamically stable. - Continue heparin drip, continue Lipitor.  Paroxysmal atrial fibrillation -Eliquis  on hold given currently on heparin drip. - Continue Cardizem  300 mg daily and metoprolol  25 mg twice daily.  GERD -Continue Protonix   Hyperlipidemia -Continue Lipitor  Essential hypertension Chronic diastolic heart failure with preserved EF 60 to 65% -Continue Cardizem .  Holding Lasix  as patient is euvolemic.  Also patient has exposed to contrast.  CKD stage IIIb -Creatinine 1.44 and GFR 35.  Renal function at baseline.  Continue to monitor.  DVT prophylaxis:  IV heparin gtts Code Status:  DNR/DNI(Do NOT Intubate) Diet: Heart healthy diet Family Communication:   Family was present at bedside, at the time of interview. Opportunity was given to ask question and all questions were answered satisfactorily.  Disposition Plan: Continue heparin drip.  Please discuss with vascular surgery in the interim regarding further treatment plan. Consults: Vascular surgery Admission status:   Inpatient, Step Down Unit  Severity of Illness: The appropriate patient status for this patient is INPATIENT. Inpatient status is judged to be reasonable and necessary in order to provide the required intensity of service to ensure the patient's safety. The patient's presenting symptoms, physical exam findings, and initial radiographic and laboratory data in the context of their chronic comorbidities is  felt to place them at high risk for further clinical deterioration. Furthermore, it is not anticipated that the patient will be medically stable for discharge from the hospital within 2 midnights of admission.   * I certify that at the point of admission it is my clinical judgment that the patient will require inpatient hospital care spanning beyond 2 midnights from the point of admission due to high intensity of service, high risk for further deterioration and high frequency of surveillance required.Aaron Aas    Kyland No, MD [facility]  How to contact the [facility] Attending or Consulting provider 7A - 7P or covering provider during after hours 7P -7A, for this patient.  Check the care team in [facility] and look for a) attending/consulting [facility] provider listed and b) the [facility] team listed Log into [email or web address] and use [facility] universal password to access. If you do not have the password, please contact the hospital operator. Locate the [facility] provider you are looking for under [facility] and page to a number that you can be directly reached. If you still have difficulty reaching the provider, please page the [facility] (Director on Call) for the Hospitalists listed on amion for assistance.  04/09/2024, 4:31 AM

## 2024-04-08 NOTE — ED Provider Notes (Signed)
 [place] EMERGENCY DEPARTMENT AT [facility], [place] Provider Note   CSN: [id number] Arrival date & time: 04/08/24  2035     History  Chief Complaint  Patient presents with   Leg Pain    MARIADELOSANGEL TRAUGER is a 89 y.o. female.  Patient is a 89 year old female who presents with left leg pain.  She is currently a hospice patient who lives in [place] nursing facility.  She started having pain in her left leg around 3-4 o'clock this afternoon and when EMS arrived, they noticed that there were no pulses in the leg and it was cold and pale compared to the other leg.  She does have a history of atrial fibrillation and previously was on anticoagulants but was taken off of anticoagulants when she entered hospice.  Family is at bedside and states that she is on her other medications but they did take her off anticoagulants.  She does have wishes that she did not want to be transported to the hospital but the hospice nurse advised that this was an acute event and authorized the transfer.       Home Medications Prior to Admission medications   Medication Sig Start Date End Date Taking? Authorizing Provider  ACETAMINOPHEN  8 HOUR 650 MG CR tablet Take 650 mg by mouth every 8 (eight) hours as needed for fever or pain. 10/27/23   [provider]  apixaban  (ELIQUIS ) 2.5 MG TABS tablet Take 1 tablet (2.5 mg total) by mouth 2 (two) times daily. 11/21/23   Deforest Fast, MD  atorvastatin  (LIPITOR) 10 MG tablet Take 1 tablet (10 mg total) by mouth at bedtime. 04/26/21   Jacqueline Matsu, MD  Cholecalciferol  (VITAMIN D ) 125 MCG (5000 UT) CAPS Take 5,000 Units by mouth daily.    [provider]  diltiazem  (CARDIZEM  CD) 300 MG 24 hr capsule Take 1 capsule (300 mg total) by mouth daily. 11/21/23   Deforest Fast, MD  furosemide  (LASIX ) 40 MG tablet Take 1 tablet (40 mg total) by mouth every other day. 11/21/23   Deforest Fast, MD  letrozole  (FEMARA ) 2.5 MG tablet Take 2.5 mg  by mouth daily.    [provider]  loperamide (IMODIUM A-D) 2 MG tablet Take 2 mg by mouth every 6 (six) hours as needed for diarrhea or loose stools. Do not exceed 8mg  in 24 hours.    [provider]  metoprolol  tartrate (LOPRESSOR ) 25 MG tablet Take 1 tablet (25 mg total) by mouth 2 (two) times daily. 11/21/23   Deforest Fast, MD  Multiple Vitamin (DAILY VITE PO) Take 1 tablet by mouth daily.    [provider]  omeprazole (PRILOSEC) 20 MG capsule Take 20 mg by mouth daily. 04/14/21   [provider]  potassium chloride  SA (KLOR-CON  M) 20 MEQ tablet Take 1 tablet (20 mEq total) by mouth every other day. With Lasix  11/21/23   Deforest Fast, MD  vitamin B-12 (CYANOCOBALAMIN ) 250 MCG tablet Take 250 mcg by mouth every other day.    [provider]  vitamin C  (VITAMIN C ) 500 MG tablet Take 1 tablet (500 mg total) by mouth daily. 09/28/19   Lesa Rape, MD      Allergies    Penicillins    Review of Systems   Review of Systems  Constitutional:  Negative for chills, diaphoresis, fatigue and fever.  HENT:  Negative for congestion, rhinorrhea and sneezing.   Eyes: Negative.   Respiratory:  Negative for cough, chest tightness and shortness  of breath.   Cardiovascular:  Negative for chest pain and leg swelling.  Gastrointestinal:  Negative for abdominal pain, blood in stool, diarrhea, nausea and vomiting.  Genitourinary:  Negative for difficulty urinating, flank pain, frequency and hematuria.  Musculoskeletal:  Positive for myalgias. Negative for back pain.  Skin:  Negative for rash.  Neurological:  Positive for numbness. Negative for dizziness, speech difficulty, weakness and headaches.    Physical Exam Updated Vital Signs BP (!) 155/87   Pulse 96   Temp 97.6 F (36.4 C)   Resp 18   Ht 5\' 4"  (1.626 m)   Wt 66 kg   SpO2 100%   BMI 24.98 kg/m  Physical Exam Constitutional:      Appearance: She is well-developed.  HENT:     Head:  Normocephalic and atraumatic.  Eyes:     Pupils: Pupils are equal, round, and reactive to light.  Cardiovascular:     Rate and Rhythm: Normal rate. Rhythm irregular.     Heart sounds: Normal heart sounds.  Pulmonary:     Effort: Pulmonary effort is normal. No respiratory distress.     Breath sounds: Normal breath sounds. No wheezing or rales.  Chest:     Chest wall: No tenderness.  Abdominal:     General: Bowel sounds are normal.     Palpations: Abdomen is soft.     Tenderness: There is no abdominal tenderness. There is no guarding or rebound.  Musculoskeletal:        General: Normal range of motion.     Cervical back: Normal range of motion and neck supple.     Comments: Left leg is cyanotic from the mid thigh down with a pale white foot.  No pulses are obtained by palpation or Doppler.  Femoral pulse is palpated.  Lymphadenopathy:     Cervical: No cervical adenopathy.  Skin:    General: Skin is warm and dry.     Findings: No rash.  Neurological:     Mental Status: She is alert and oriented to person, place, and time.     ED Results / Procedures / Treatments   Labs (all labs ordered are listed, but only abnormal results are displayed) Labs Reviewed  BASIC METABOLIC PANEL WITH GFR - Abnormal; Notable for the following components:      Result Value   Glucose, Bld 175 (*)    BUN 34 (*)    Creatinine, Ser 1.44 (*)    GFR, Estimated 35 (*)    All other components within normal limits  CBC WITH DIFFERENTIAL/PLATELET - Abnormal; Notable for the following components:   RDW 15.6 (*)    Neutro Abs 8.7 (*)    Abs Immature Granulocytes 0.08 (*)    All other components within normal limits  HEPARIN LEVEL (UNFRACTIONATED)  COMPREHENSIVE METABOLIC PANEL WITH GFR  CBC  PROTIME-INR  APTT  I-STAT CHEM 8, ED  TYPE AND SCREEN    EKG EKG Interpretation Date/Time:  Monday April 08 2024 20:44:21 EDT Ventricular Rate:  86 PR Interval:    QRS Duration:  91 QT Interval:  404 QTC  Calculation: 484 R Axis:   32  Text Interpretation: Atrial fibrillation Confirmed by Hershel Los [phone]) on 04/08/2024 10:12:41 PM  Radiology CT Angio Aortobifemoral W and/or Wo Contrast Result Date: 04/08/2024 CLINICAL DATA:  Claudication/leg ischemia EXAM: CT ANGIOGRAPHY OF ABDOMINAL AORTA WITH ILIOFEMORAL RUNOFF TECHNIQUE: Multidetector CT imaging of the abdomen, pelvis and lower extremities was performed using the standard protocol during bolus  administration of intravenous contrast. Multiplanar CT image reconstructions and MIPs were obtained to evaluate the vascular anatomy. RADIATION DOSE REDUCTION: This exam was performed according to the departmental dose-optimization program which includes automated exposure control, adjustment of the mA and/or kV according to patient size and/or use of iterative reconstruction technique. CONTRAST:  OMNIPAQUE  IOHEXOL  350 MG/ML SOLN COMPARISON:  CT abdomen pelvis 03/06/2022 FINDINGS: VASCULAR Aorta: Aortic atherosclerotic calcification without hemodynamically significant stenosis. No aneurysm or dissection. Celiac: No hemodynamically significant stenosis. No aneurysm or dissection. SMA: No hemodynamically significant stenosis. No aneurysm or dissection. Renals: No hemodynamically significant stenosis. No aneurysm or dissection. IMA: No hemodynamically significant stenosis. No aneurysm or dissection. RIGHT Lower Extremity Inflow: No hemodynamically significant stenosis. No aneurysm or dissection. Outflow: Common, superficial, and profunda femoral arteries are patent without aneurysm or dissection. Irregular atherosclerotic plaque in the popliteal artery causes up to moderate narrowing. Runoff: Poorly assessed due to slow flow and peripheral calcification. LEFT Lower Extremity Inflow: No hemodynamically significant stenosis. No aneurysm or dissection. Outflow: The common femoral artery is occluded at its bifurcation into the superficial and profunda femoral  arteries which are also occluded. The popliteal artery is not opacified. Runoff: No opacification of the lower extremity arteries. Veins: No obvious venous abnormality within the limitations of this arterial phase study. Review of the MIP images confirms the above findings. NON-VASCULAR Lower chest: No acute abnormality. Hepatobiliary: Dilated common bile duct measuring 12 mm in diameter. No radiopaque stone. No focal hepatic lesion. Pancreas: Unremarkable. Spleen: Unremarkable. Adrenals/Urinary Tract: Stable adrenal glands. Delayed left nephrogram. New marked left hydroureteronephrosis. Soft tissue thickening at the left UVJ/bladder wall is partially obscured by streak artifact (circa series 5/image 177). No radiopaque stone. Stomach/Bowel: No bowel wall thickening or obstruction. Stomach and appendix are within normal limits. Lymphatic: No lymphadenopathy. Reproductive: Status post hysterectomy. No adnexal mass. The vaginal cuff is poorly evaluated due to streak artifact. Other: Small volume free fluid in the pelvis. No free intraperitoneal air. Musculoskeletal: IM rod and screw fixation right femur. No acute fracture. Chronic compression fractures of T9, L1, and L3. Vertebroplasty of T11 and T12. IMPRESSION: 1. 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 The superficial, profunda, and popliteal arteries are not opacified. No opacification of the lower extremity arteries. 2. New marked left hydroureteronephrosis. Soft tissue thickening at the left UVJ/bladder wall is partially obscured by streak artifact and is suspicious for occlusion secondary to infiltrative malignancy. Urology consult recommended. 3. Dilated common bile duct is new since 03/06/2022. Recommend correlation with LFTs. 4. Small volume free fluid in the pelvis. 5. Aortic Atherosclerosis (ICD10-I70.0). Critical Value/emergent results were called by telephone at the time of  interpretation on 04/08/2024 at 9:51 pm to provider Thaniel Coluccio , who verbally acknowledged these results. Electronically Signed   By: Rozell Cornet M.D.   On: 04/08/2024 21:51    Procedures Procedures    Medications Ordered in ED Medications  heparin ADULT infusion 100 units/mL (25000 units/250mL) (1,100 Units/hr Intravenous New Bag/Given 04/08/24 2157)  sodium chloride  flush (NS) 0.9 % injection 3 mL (has no administration in time range)  acetaminophen  (TYLENOL ) tablet 650 mg (has no administration in time range)    Or  acetaminophen  (TYLENOL ) suppository 650 mg (has no administration in time range)  ondansetron  (ZOFRAN ) tablet 4 mg (has no administration in time range)    Or  ondansetron  (ZOFRAN ) injection 4 mg (has no administration in time range)  HYDROmorphone  (DILAUDID ) injection 0.5-1  mg (has no administration in time range)  dextrose  5 % in lactated ringers  infusion (has no administration in time range)  heparin bolus via infusion 4,000 Units (4,000 Units Intravenous Bolus from Bag 04/08/24 2157)  iohexol  (OMNIPAQUE ) 350 MG/ML injection 100 mL (100 mLs Intravenous Contrast Given 04/08/24 2122)    ED Course/ Medical Decision Making/ A&P Clinical Course as of 04/08/24 2312  Mon Apr 08, 2024  2050 Patient has what appears to be an ischemic leg.  Unable to obtain pulses.  Discussed with Dr. Rosalva Comber with vascular surgery.  Recommends CTA.  Will start heparin.  Patient is in hospice.  Patient's daughter is at bedside.  They are not opposed to patient having surgery but did want to verify that this will be covered under hospice.  I did speak with the nurse on-call [facility] who advises that patient would have to be removed from hospice and then restart hospice once this acute event is over. [MB]    Clinical Course User Index [MB] Hershel Los, MD                                 Medical Decision Making Amount and/or Complexity of Data Reviewed Labs: ordered. Radiology:  ordered.  Risk Prescription drug management. Decision regarding hospitalization.   Patient is an 89 year old female who presents with an ischemic leg.  Labs show mild elevation in her creatinine.  CT angio show a large arterial clot in her left leg.  She was started on IV heparin.  Dr. Rosalva Comber has discussed with the family and will take her emergently to the operating room.  He request admission by the hospitalist service.  Discussed with Dr. Sundil who will admit the pt.  CRITICAL CARE Performed by: Hershel Los Total critical care time: 45 minutes Critical care time was exclusive of separately billable procedures and treating other patients. Critical care was necessary to treat or prevent imminent or life-threatening deterioration. Critical care was time spent personally by me on the following activities: development of treatment plan with patient and/or surrogate as well as nursing, discussions with consultants, evaluation of patient's response to treatment, examination of patient, obtaining history from patient or surrogate, ordering and performing treatments and interventions, ordering and review of laboratory studies, ordering and review of radiographic studies, pulse oximetry and re-evaluation of patient's condition.   Final Clinical Impression(s) / ED Diagnoses Final diagnoses:  Ischemic leg    Rx / DC Orders ED Discharge Orders     None         Hershel Los, MD 04/08/24 2314

## 2024-04-08 NOTE — Anesthesia Preprocedure Evaluation (Addendum)
 Anesthesia Evaluation  Patient identified by MRN, date of birth, ID band Patient awake    Reviewed: Allergy & Precautions, NPO status , Patient's Chart, lab work & pertinent test results, reviewed documented beta blocker date and time   History of Anesthesia Complications Negative for: history of anesthetic complications  Airway Mallampati: II  TM Distance: >3 FB Neck ROM: Full    Dental  (+) Dental Advisory Given   Pulmonary neg pulmonary ROS   breath sounds clear to auscultation       Cardiovascular hypertension, Pt. on medications and Pt. on home beta blockers pulmonary hypertension (mild)(-) angina +CHF  + dysrhythmias Atrial Fibrillation  Rhythm:Irregular Rate:Normal  '24 ECHO: EF 55 to 60% 1. The LV has normal function, no regional wall motion abnormalities.   2. RVF is normal. The right ventricular size is normal. There is mildly elevated pulmonary artery systolic pressure. The estimated right ventricular systolic pressure is 44.6 mmHg.   3. Left atrial size was moderately dilated.   4. The mitral valve is abnormal. Mild MR. No MS. Severe mitral annular calcification.   5. The aortic valve has an indeterminant number of cusps. AI is mild to moderate. No aortic stenosis is present.     Neuro/Psych negative neurological ROS     GI/Hepatic Neg liver ROS,GERD  Medicated and Controlled,,  Endo/Other  negative endocrine ROS    Renal/GU Renal InsufficiencyRenal disease     Musculoskeletal   Abdominal   Peds  Hematology Eliquis : family says no longer on eliquis  now that in Hospice Hb 13.2, plt 248k   Anesthesia Other Findings   Reproductive/Obstetrics                             Anesthesia Physical Anesthesia Plan  ASA: 4 and emergent  Anesthesia Plan: General   Post-op Pain Management: Ofirmev  IV (intra-op)*   Induction: Intravenous and Rapid sequence  PONV Risk Score and Plan: 3  and Ondansetron , Dexamethasone  and Treatment may vary due to age or medical condition  Airway Management Planned: Oral ETT  Additional Equipment: Arterial line  Intra-op Plan:   Post-operative Plan: Extubation in OR  Informed Consent: I have reviewed the patients History and Physical, chart, labs and discussed the procedure including the risks, benefits and alternatives for the proposed anesthesia with the patient or authorized representative who has indicated his/her understanding and acceptance.   Patient has DNR.  Suspend DNR, Discussed DNR with power of attorney and Discussed DNR with patient.   Consent reviewed with POA and Dental advisory given  Plan Discussed with: CRNA and Surgeon  Anesthesia Plan Comments: (Discussed with patient and her daughter, family)       Anesthesia Quick Evaluation

## 2024-04-09 ENCOUNTER — Other Ambulatory Visit: Payer: Self-pay

## 2024-04-09 ENCOUNTER — Encounter (HOSPITAL_COMMUNITY): Payer: Self-pay | Admitting: Vascular Surgery

## 2024-04-09 DIAGNOSIS — I4891 Unspecified atrial fibrillation: Secondary | ICD-10-CM

## 2024-04-09 DIAGNOSIS — N1832 Chronic kidney disease, stage 3b: Secondary | ICD-10-CM | POA: Diagnosis not present

## 2024-04-09 DIAGNOSIS — N1330 Unspecified hydronephrosis: Secondary | ICD-10-CM | POA: Insufficient documentation

## 2024-04-09 DIAGNOSIS — I10 Essential (primary) hypertension: Secondary | ICD-10-CM | POA: Diagnosis not present

## 2024-04-09 DIAGNOSIS — I70222 Atherosclerosis of native arteries of extremities with rest pain, left leg: Principal | ICD-10-CM

## 2024-04-09 DIAGNOSIS — E785 Hyperlipidemia, unspecified: Secondary | ICD-10-CM | POA: Diagnosis not present

## 2024-04-09 DIAGNOSIS — I998 Other disorder of circulatory system: Secondary | ICD-10-CM | POA: Diagnosis not present

## 2024-04-09 LAB — URINALYSIS, ROUTINE W REFLEX MICROSCOPIC
RBC / HPF: >50 RBC/hpf (ref 0–5)
WBC, UA: >50 WBC/hpf (ref 0–5)

## 2024-04-09 LAB — PROTIME-INR
INR: 1.3 — ABNORMAL HIGH (ref 0.8–1.2)
Prothrombin Time: 16.4 s — ABNORMAL HIGH (ref 11.4–15.2)

## 2024-04-09 LAB — COMPREHENSIVE METABOLIC PANEL WITH GFR
ALT: 11 U/L (ref 0–44)
AST: 26 U/L (ref 15–41)
Albumin: 3.5 g/dL (ref 3.5–5.0)
Alkaline Phosphatase: 55 U/L (ref 38–126)
Anion gap: 15 (ref 5–15)
BUN: 28 mg/dL — ABNORMAL HIGH (ref 8–23)
CO2: 25 mmol/L (ref 22–32)
Calcium: 9.3 mg/dL (ref 8.9–10.3)
Chloride: 99 mmol/L (ref 98–111)
Creatinine, Ser: 1.31 mg/dL — ABNORMAL HIGH (ref 0.44–1.00)
GFR, Estimated: 39 mL/min — ABNORMAL LOW (ref >60)
Glucose, Bld: 143 mg/dL — ABNORMAL HIGH (ref 70–99)
Potassium: 4.2 mmol/L (ref 3.5–5.1)
Sodium: 139 mmol/L (ref 135–145)
Total Bilirubin: 0.6 mg/dL (ref 0.0–1.2)
Total Protein: 6.1 g/dL — ABNORMAL LOW (ref 6.5–8.1)

## 2024-04-09 LAB — CBC
HCT: 36.8 % (ref 36.0–46.0)
Hemoglobin: 12 g/dL (ref 12.0–15.0)
MCH: 30.5 pg (ref 26.0–34.0)
MCHC: 32.6 g/dL (ref 30.0–36.0)
MCV: 93.4 fL (ref 80.0–100.0)
Platelets: 231 10*3/uL (ref 150–400)
RBC: 3.94 MIL/uL (ref 3.87–5.11)
RDW: 15.8 % — ABNORMAL HIGH (ref 11.5–15.5)
WBC: 10.8 10*3/uL — ABNORMAL HIGH (ref 4.0–10.5)
nRBC: 0 % (ref 0.0–0.2)

## 2024-04-09 LAB — HEPARIN LEVEL (UNFRACTIONATED)
Heparin Unfractionated: >1.1 [IU]/mL — ABNORMAL HIGH (ref 0.30–0.70)
Heparin Unfractionated: >1.1 [IU]/mL — ABNORMAL HIGH (ref 0.30–0.70)

## 2024-04-09 MED ORDER — METOPROLOL TARTRATE 25 MG PO TABS
25.0000 mg | ORAL_TABLET | Freq: Two times a day (BID) | ORAL | Status: DC
  Filled 2024-04-09: qty 1

## 2024-04-09 MED ORDER — POTASSIUM CHLORIDE CRYS ER 20 MEQ PO TBCR
20.0000 meq | EXTENDED_RELEASE_TABLET | Freq: Once | ORAL | Status: DC
  Filled 2024-04-09: qty 2

## 2024-04-09 MED ORDER — PANTOPRAZOLE SODIUM 40 MG PO TBEC
40.0000 mg | DELAYED_RELEASE_TABLET | Freq: Every day | ORAL | Status: DC
  Administered 2024-04-09 – 2024-04-12 (×4): 40 mg via ORAL
  Filled 2024-04-09 (×4): qty 1

## 2024-04-09 MED ORDER — HEMOSTATIC AGENTS (NO CHARGE) OPTIME
TOPICAL | Status: DC | PRN
  Administered 2024-04-09: 1 via TOPICAL

## 2024-04-09 MED ORDER — HEPARIN (PORCINE) 25000 UT/250ML-% IV SOLN
850.0000 [IU]/h | INTRAVENOUS | Status: DC
  Administered 2024-04-09: 850 [IU]/h via INTRAVENOUS
  Filled 2024-04-09: qty 250

## 2024-04-09 MED ORDER — HEPARIN (PORCINE) 25000 UT/250ML-% IV SOLN
500.0000 [IU]/h | INTRAVENOUS | Status: AC
  Administered 2024-04-09: 500 [IU]/h via INTRAVENOUS
  Filled 2024-04-09: qty 250

## 2024-04-09 MED ORDER — DILTIAZEM HCL ER COATED BEADS 180 MG PO CP24
300.0000 mg | ORAL_CAPSULE | Freq: Every day | ORAL | Status: DC
  Administered 2024-04-09 – 2024-04-12 (×4): 300 mg via ORAL
  Filled 2024-04-09 (×3): qty 1

## 2024-04-09 MED ORDER — ATORVASTATIN CALCIUM 10 MG PO TABS
10.0000 mg | ORAL_TABLET | Freq: Every day | ORAL | Status: DC
  Administered 2024-04-09 – 2024-04-11 (×3): 10 mg via ORAL
  Filled 2024-04-09 (×3): qty 1

## 2024-04-09 MED ORDER — ESMOLOL HCL 100 MG/10ML IV SOLN
INTRAVENOUS | Status: DC | PRN
  Administered 2024-04-09: 20 mg via INTRAVENOUS

## 2024-04-09 MED ORDER — METOPROLOL TARTRATE 25 MG PO TABS
25.0000 mg | ORAL_TABLET | Freq: Two times a day (BID) | ORAL | Status: DC
  Administered 2024-04-09 – 2024-04-12 (×7): 25 mg via ORAL
  Filled 2024-04-09 (×6): qty 1

## 2024-04-09 MED ORDER — ACETAMINOPHEN 10 MG/ML IV SOLN
INTRAVENOUS | Status: DC | PRN
  Administered 2024-04-08: 1000 mg via INTRAVENOUS

## 2024-04-09 MED ORDER — HEPARIN (PORCINE) 25000 UT/250ML-% IV SOLN
1100.0000 [IU]/h | INTRAVENOUS | Status: DC
  Administered 2024-04-09: 1100 [IU]/h via INTRAVENOUS
  Filled 2024-04-09: qty 250

## 2024-04-09 MED ORDER — FUROSEMIDE 40 MG PO TABS: 40.0000 mg | ORAL_TABLET | ORAL | Status: DC

## 2024-04-09 MED ORDER — SUGAMMADEX SODIUM 200 MG/2ML IV SOLN
INTRAVENOUS | Status: DC | PRN
  Administered 2024-04-09: 132 mg via INTRAVENOUS

## 2024-04-09 MED ORDER — ONDANSETRON HCL 4 MG/2ML IJ SOLN
INTRAMUSCULAR | Status: DC | PRN
  Administered 2024-04-09: 4 mg via INTRAVENOUS

## 2024-04-09 MED ORDER — FENTANYL CITRATE (PF) 100 MCG/2ML IJ SOLN: 25.0000 ug | INTRAMUSCULAR | Status: DC | PRN

## 2024-04-09 MED ORDER — OXYCODONE HCL 5 MG PO TABS: 5.0000 mg | ORAL_TABLET | ORAL | Status: DC | PRN

## 2024-04-09 MED ORDER — DILTIAZEM HCL ER COATED BEADS 180 MG PO CP24
300.0000 mg | ORAL_CAPSULE | Freq: Every day | ORAL | Status: DC
  Filled 2024-04-09: qty 1

## 2024-04-09 NOTE — Transfer of Care (Signed)
 Immediate Anesthesia Transfer of Care Note  Patient: NICKAYLA ALBITER  Procedure(s) Performed: RIGHT Femoral,Popliteal, profunda EMBOLECTOMY (Left: Groin)  Patient Location: PACU  Anesthesia Type:General  Level of Consciousness: awake and alert   Airway & Oxygen Therapy: Patient Spontanous Breathing and Patient connected to nasal cannula oxygen  Post-op Assessment: Report given to RN and Post -op Vital signs reviewed and stable  Post vital signs: Reviewed and stable  Last Vitals:  Vitals Value Taken Time  BP 144/67 04/09/24 0052  Temp    Pulse 84 04/09/24 0054  Resp 14 04/09/24 0054  SpO2 97 % 04/09/24 0054  Vitals shown include unfiled device data.  Last Pain: There were no vitals filed for this visit.       Complications: No notable events documented.

## 2024-04-09 NOTE — Progress Notes (Addendum)
 Progress Note    04/09/2024 6:32 AM 1 Day Post-Op  Subjective:  says her left foot feels much better. Very appreciative.  Says she does not get out of bed and will need help with eating.  Afebrile HR 80's-90's afib 120's-160's systolic 100% 2LO2NC  Vitals:   04/09/24 0330 04/09/24 0405  BP: 125/73 138/75  Pulse: 85 80  Resp: 14 14  Temp: 98 F (36.7 C) 97.6 F (36.4 C)  SpO2: 100% 100%    Physical Exam: General:  no distress Lungs:  non labored Incisions:  left groin is soft without hematoma.  Incision looks good.  Extremities:  + left DP doppler flow; left calf and anterior compartments are soft and non tender to palpation Abdomen:  soft  CBC    Component Value Date/Time   WBC 10.8 (H) 04/09/2024 0440   RBC 3.94 04/09/2024 0440   HGB 12.0 04/09/2024 0440   HGB 11.6 09/19/2023 1635   HCT 36.8 04/09/2024 0440   HCT 36.9 09/19/2023 1635   PLT 231 04/09/2024 0440   PLT 293 09/19/2023 1635   MCV 93.4 04/09/2024 0440   MCV 89 09/19/2023 1635   MCH 30.5 04/09/2024 0440   MCHC 32.6 04/09/2024 0440   RDW 15.8 (H) 04/09/2024 0440   RDW 14.4 09/19/2023 1635   LYMPHSABS 0.7 04/08/2024 2039   LYMPHSABS 0.6 (L) 09/05/2017 1316   MONOABS 0.5 04/08/2024 2039   EOSABS 0.0 04/08/2024 2039   EOSABS 0.0 09/05/2017 1316   BASOSABS 0.0 04/08/2024 2039   BASOSABS 0.0 09/05/2017 1316    BMET    Component Value Date/Time   NA 139 04/09/2024 0440   NA 144 09/19/2023 1635   K 4.2 04/09/2024 0440   CL 99 04/09/2024 0440   CO2 25 04/09/2024 0440   GLUCOSE 143 (H) 04/09/2024 0440   BUN 28 (H) 04/09/2024 0440   BUN 22 09/19/2023 1635   CREATININE 1.31 (H) 04/09/2024 0440   CREATININE 0.82 03/21/2016 0915   CALCIUM  9.3 04/09/2024 0440   CALCIUM  8.0 (L) 09/26/2019 0654   GFRNONAA 39 (L) 04/09/2024 0440   GFRAA 31 (L) 10/13/2020 1420    INR    Component Value Date/Time   INR 1.3 (H) 04/09/2024 0440     Intake/Output Summary (Last 24 hours) at 04/09/2024 1610 Last  data filed at 04/09/2024 0533 Gross per 24 hour  Intake 1177.86 ml  Output 825 ml  Net 352.86 ml      Assessment/Plan:  89 y.o. female is s/p:   Left common femoral, profunda, superficial femoral, popliteal artery embolectomy on 04/09/2024 by Dr. Rosalva Comber for acute LLE ischemia  1 Day Post-Op   -pt subjectively feeling better.  pt with + left PT doppler flow left foot.  No evidence of compartment syndrome as left calf and anterior compartments are soft and non tender.  -will need 2D echo-I have ordered this -DVT prophylaxis:  heparin gtt -pt is non ambulatory   Maryanna Smart, PA-C [facility] [phone] 04/09/2024 6:32 AM  VASCULAR STAFF ADDENDUM: I have independently interviewed and examined the patient. I agree with the above.  Multiphasic PT signal, left groin soft Cathter with some hematuria, no clots. Leave in for one more day to ensure it does not worsen on [facility] Echo pending No plan for further intervention at this time.  D/c pending echo, clearing hematuria.  Will plan DOAC start tomorrow morning. Possible DC tomorrow    Kayla Part MD [facility]  Office Phone Number: [phone] 04/09/2024 9:58 AM

## 2024-04-09 NOTE — Progress Notes (Signed)
 Applied TED hose on pt's bilateral extremities per order.   Oral Billings, RN

## 2024-04-09 NOTE — Progress Notes (Signed)
 ANTICOAGULATION CONSULT NOTE  Pharmacy Consult for Heparin Indication:  vascular occlusion  Allergies  Allergen Reactions   Penicillins Itching and Other (See Comments)    Dizziness to the patient of passing out- told by MD to "never take this again" Did it involve swelling of the face/tongue/throat, SOB, or low BP? No Did it involve sudden or severe rash/hives, skin peeling, or any reaction on the inside of your mouth or nose? No Did you need to seek medical attention at a hospital or doctor's office? No When did it last happen? "a long time ago"    If all above answers are "NO", may proceed with cephalosporin use.     Patient Measurements: Height: 5\' 4"  (162.6 cm) Weight: 66 kg (145 lb 8.1 oz) IBW/kg (Calculated) : 54.7 Heparin Dosing Weight: 66 kg  Vital Signs: Temp: 98.3 F (36.8 C) (04/29 1211) Temp Source: Oral (04/29 1211) BP: 123/105 (04/29 1211) Pulse Rate: 95 (04/29 1211)  Labs: Recent Labs    04/08/24 2039 04/09/24 0440 04/09/24 1153  HGB 13.2 12.0  --   HCT 40.5 36.8  --   PLT 248 231  --   LABPROT  --  16.4*  --   INR  --  1.3*  --   HEPARINUNFRC  --   --  >1.10*  CREATININE 1.44* 1.31*  --     Estimated Creatinine Clearance: 27.2 mL/min (A) (by C-G formula based on SCr of 1.31 mg/dL (H)).   Medical History: Past Medical History:  Diagnosis Date   Cancer [facility])    endometrial ca   Chronic diastolic CHF (congestive heart failure) (HCC)    Chronic edema    CKD (chronic kidney disease), stage III (HCC)    Dyslipidemia    Fracture of left humerus    History of cardiovascular stress test    Lexiscan  Myoview  (06/2014): No ischemia or scar, not gated, low risk   Hx of echocardiogram    Echo (05/02/14): EF 60% to 65%. No regional wall motion abnormalities. Mild AI, mildly dilated aortic root (37 mm), trivial MR, trivial TR   Hyperlipidemia    Hypertension    Lipoma of skin    multiple   Osteopenia    PAF (paroxysmal atrial fibrillation) (HCC)     failed DCCV 05/2014 >> Flecainide  started >> DCCV 7/15 sucessful;  f/u ETT neg for pro-arrhythmia >> recurrent AF/AFL >> Flecainide  inc to 100 bid with repeat DCCV 9/15   Vitamin D  deficiency 09/25/2019     Assessment: 89 yo W on outpatient hospice with LLE arterial occlusion s/p embolectomy 4/29. No anticoagulation prior to admission (Apixaban  was removed when pt went on hospice). Pharmacy consulted for heparin.    Heparin ran at 500 units/hr until 4am then increased to 1100 units/hr. Heparin level >1.1 is supratherapeutic on 1100 units/hr.  No issues with infusion or bleeding per RN. Heparin running in R arm, level drawn from L arm. Patient has blood tinged urine at baseline per RN.   Goal of Therapy:  Heparin level 0.3-0.7 units/ml Monitor platelets by anticoagulation protocol: Yes   Plan:  Hold heparin infusion for 30 min and decrease to 850 units/hr F/u 8hr level Monitor daily heparin level, CBC, signs/symptoms of bleeding   Dorene Gang, PharmD, BCPS, BCCP Clinical Pharmacist  Please check AMION for all [facility] Pharmacy phone numbers After 10:00 PM, call Main Pharmacy [phone]

## 2024-04-09 NOTE — Op Note (Signed)
    NAME: [name]    MRN: [id number] DOB: 05-05-1933    DATE OF OPERATION: 04/09/2024  PREOP DIAGNOSIS:    Left lower extremity acute limb ischemia  POSTOP DIAGNOSIS:    Same  PROCEDURE:    Left common femoral, profunda, superficial femoral, popliteal artery embolectomy  SURGEON: Kayla Part  ASSIST: Aubry Blase, PA  ANESTHESIA: General  EBL: 200 mL  INDICATIONS:    Christina Morgan is a 89 y.o. female on hospice care who presented with acute limb ischemia of the left lower extremity after stopping DOAC with known A-fib.  We discussed medical management versus operative intervention in an effort to revascularize specifically the profunda should she require a palliative AKA.  Have discussed the risks and benefits of both, both her and her daughter elected to proceed with left lower extremity revascularization-embolectomy.  With her comorbidities and limited ambulatory status, both were aware that embolectomy would only occur from the femoral artery in an effort to revascularize the profunda.  We would not chase thrombus into the tibial vessels.  FINDINGS:   Acute on chronic thrombus in the common femoral, profunda, superficial femoral arteries.  TECHNIQUE:   Patient brought to the OR laid in supine position.  General anesthesia was induced the patient's prepped draped standard fashion.  He was began with ultrasound insonation of the left common femoral artery.  A transverse incision was made, and carried down to the common femoral bifurcation.  The common femoral artery,profunda, superficial femoral artery were all controlled with Vesseloops.  The patient was heparinized.  The vessels were clamped, and opened through a transverse arteriotomy.  Clot was immediately encountered and removed.  Fogarty embolectomy then followed, first of the common femoral artery Followed by the profunda and superficial femoral artery.  Several passes were made in both the profunda and  superficial femoral artery.  I had difficulty advancing a #4 #3 and #2 Fogarty embolectomy catheters beyond the 40 cm mark.  The #2 would pass further, but the balloon continue to rupture due to calcific disease.  There was backbleeding, and I was happy with this result.  The artery was closed using several 6-0 Prolene sutures and interrupted fashion.  Special attention was taken to ensure there is no active bleeding at case completion.  The wound bed was irrigated with copious amounts saline and closed using Vicryl suture with Monocryl and Dermabond at the level of the skin.  The patient had a multiphasic posterior tibial artery signal.  No anterior tibial artery or peroneal artery signal. Plan to restart heparin at 4 AM.  Kayla Part, MD [facility] DATE OF DICTATION:   04/09/2024

## 2024-04-09 NOTE — Anesthesia Postprocedure Evaluation (Signed)
 Anesthesia Post Note  Patient: Christina Morgan  Procedure(s) Performed: RIGHT Femoral,Popliteal, profunda EMBOLECTOMY (Left: Groin)     Patient location during evaluation: PACU Anesthesia Type: General Level of consciousness: patient cooperative, oriented and awake and alert Pain management: pain level controlled Vital Signs Assessment: post-procedure vital signs reviewed and stable Respiratory status: nonlabored ventilation, spontaneous breathing, respiratory function stable and patient connected to nasal cannula oxygen Cardiovascular status: blood pressure returned to baseline and stable Postop Assessment: no apparent nausea or vomiting Anesthetic complications: no   No notable events documented.  Last Vitals:  Vitals:   04/09/24 0330 04/09/24 0405  BP: 125/73 138/75  Pulse: 85 80  Resp: 14 14  Temp: 36.7 C 36.4 C  SpO2: 100% 100%                 Jazarah Capili,E. Catherin Doorn

## 2024-04-09 NOTE — Progress Notes (Signed)
 PROGRESS NOTE  Christina Morgan:[id number] DOB: 1933/06/25   PCP: Cole Daubs, [facility]  [facility] is from: [facility].  Followed by [facility] hospice.   DOA: 04/08/2024 LOS: 1  Chief complaints Chief Complaint  Patient presents with   Leg Pain     Brief Narrative / Interim history: 89 year old F followed by hospice with PMH of HFpEF, CKD-3B, PAF off Eliquis , endometrial cancer  s/p TAH/BSO in 2013, HTN, HLD and GERD brought to ED by EMS due to left leg pain and found to have occlusion of left common femoral artery at its bifurcation into the superficial and profundofemoral arteries with no opacification of the superficial profunda and popliteal arteries and lower extremity.  Patient was off Eliquis  since she transition to hospice.  She is sedentary.  Vascular surgery consulted.  Patient underwent left common femoral, profunda, superficial femoral, popliteal artery embolectomy, and started on heparin.  CT angio also showed new marked left hydroureteronephrosis with soft tissue thickening at the left UVJ/bladder wall suspicious for occlusion secondary to infiltrative malignancy and dilated CBD.  Subjective: Seen and examined earlier this morning.  No major events overnight of this morning.  No complaints.  Denies leg pain, chest pain, shortness of breath or GI symptoms.  Foley catheter in place.   Objective: Vitals:   04/09/24 0315 04/09/24 0330 04/09/24 0405 04/09/24 0800  BP: 119/69 125/73 138/75 (!) 145/84  Pulse: 86 85 80 83  Resp: 12 14 14 20   Temp:  98 F (36.7 C) 97.6 F (36.4 C) (!) 97.5 F (36.4 C)  TempSrc:   Oral Oral  SpO2: 91% 100% 100% 100%  Weight:      Height:        Examination:  GENERAL: No apparent distress.  Nontoxic. HEENT: MMM.  Vision and hearing grossly intact.  NECK: Supple.  No apparent JVD.  RESP:  No IWOB.  Fair aeration bilaterally. CVS:  RRR. Heart sounds normal.  ABD/GI/GU: BS+. Abd soft, NTND.  Foley in place.   Pink urine. MSK/EXT:  Moves extremities. No apparent deformity. No edema.  Faint DP pulses. SKIN: no apparent skin lesion or wound NEURO: Awake, alert and oriented appropriately.  No apparent focal neuro deficit. PSYCH: Calm. Normal affect.   Consultants:  Vascular surgery  Procedures: 4/29- left common femoral, profunda, superficial femoral, popliteal artery embolectomy  Microbiology summarized: None  Assessment and plan: Critical left lower extremity ischemia: CT angio bilateral aortofemoral runoff showed cclusion of the left common femoral artery at its bifurcation into the superficial and profunda femoral arteries with no opacification of of the superficial profunda, and popliteal arteries and lower extremity arteries.   -S/p left common femoral, profunda, superficial femoral, popliteal artery embolectomy -Continue IV heparin -Discontinue SCD   Paroxysmal A-fib with RVR: Mild RVR to 120s this morning.  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  Creatinine improving.  No flank pain or distress.  Discussed with patient's daughter, [name] about urology consultation.  Kaytlynne would like to discuss with her brother and hospice before making decision.  She says she will be up here to meet with hospice in the next 1 to 2 hours. -Hold off urology consultation pending family decision.  Chronic HFpEF: Appears euvolemic on exam.  No cardiopulmonary symptoms or distress. -Continue holding Lasix . -Monitor fluid and respiratory status - Discontinue echocardiogram.  Doubt utility.  CKD-3B: Creatinine better than baseline. Recent Labs  11/15/23 0257 11/16/23 0234 11/17/23 0244 11/18/23 0227 11/18/23 1441 11/19/23 0256 11/20/23 0254 11/21/23 0241 04/08/24 2039 04/09/24 0440  BUN 43* 47* 50* 51* 54* 47* 44* 45* 34* 28*  CREATININE 1.84* 1.87* 1.91* 2.12* 2.33*  2.07* 2.00* 1.87* 1.44* 1.31*  - Continue holding Lasix   Dilated CBD: No GI symptoms.  LFT within normal.  Essential hypertension: BP slightly elevated -Home Cardizem  CD and metoprolol   GERD -Continue Protonix    Hyperlipidemia -Continue Lipitor  History of endometrial cancer s/p TAH and BSO in 2013.   Advance care planning: DNR limited.  -[facility] hospice following  Body mass index is 24.98 kg/m.    DVT prophylaxis:  Place TED hose Start: 04/09/24 0050 Place TED hose Start: 04/08/24 2250  Code Status: DNR-Limited Family Communication: Updated patient's daughter over the phone Level of care: Progressive Status is: Inpatient Remains inpatient appropriate because: Critical limb ischemia   Final disposition: To be determined   55 minutes with more than 50% spent in reviewing records, counseling patient/family and coordinating care.   Sch Meds:  Scheduled Meds:  atorvastatin   10 mg Oral QHS   diltiazem   300 mg Oral Daily   metoprolol  tartrate  25 mg Oral BID   pantoprazole   40 mg Oral Daily   potassium chloride   20-40 mEq Oral Once   sodium chloride  flush  3 mL Intravenous Q12H   Continuous Infusions:  heparin 1,100 Units/hr (04/09/24 0533)   PRN Meds:.acetaminophen  **OR** acetaminophen , HYDROmorphone  (DILAUDID ) injection, ondansetron  **OR** ondansetron  (ZOFRAN ) IV, oxyCODONE   Antimicrobials: Anti-infectives (From admission, onward)    None        I have personally reviewed the following labs and images: CBC: Recent Labs  Lab 04/08/24 2039 04/09/24 0440  WBC 10.1 10.8*  NEUTROABS 8.7*  --   HGB 13.2 12.0  HCT 40.5 36.8  MCV 94.6 93.4  PLT 248 231   BMP &GFR Recent Labs  Lab 04/08/24 2039 04/09/24 0440  NA 139 139  K 3.7 4.2  CL 102 99  CO2 23 25  GLUCOSE 175* 143*  BUN 34* 28*  CREATININE 1.44* 1.31*  CALCIUM  9.2 9.3   Estimated Creatinine Clearance: 27.2 mL/min (A) (by C-G formula based on SCr of 1.31 mg/dL (H)). Liver &  Pancreas: Recent Labs  Lab 04/09/24 0440  AST 26  ALT 11  ALKPHOS 55  BILITOT 0.6  PROT 6.1*  ALBUMIN 3.5   No results for input(s): "LIPASE", "AMYLASE" in the last 168 hours. No results for input(s): "AMMONIA" in the last 168 hours. Diabetic: No results for input(s): "HGBA1C" in the last 72 hours. No results for input(s): "GLUCAP" in the last 168 hours. Cardiac Enzymes: No results for input(s): "CKTOTAL", "CKMB", "CKMBINDEX", "TROPONINI" in the last 168 hours. No results for input(s): "PROBNP" in the last 8760 hours. Coagulation Profile: Recent Labs  Lab 04/09/24 0440  INR 1.3*   Thyroid  Function Tests: No results for input(s): "TSH", "T4TOTAL", "FREET4", "T3FREE", "THYROIDAB" in the last 72 hours. Lipid Profile: No results for input(s): "CHOL", "HDL", "LDLCALC", "TRIG", "CHOLHDL", "LDLDIRECT" in the last 72 hours. Anemia Panel: No results for input(s): "VITAMINB12", "FOLATE", "FERRITIN", "TIBC", "IRON", "RETICCTPCT" in the last 72 hours. Urine analysis:    Component Value Date/Time   COLORURINE YELLOW 11/16/2023 1210   APPEARANCEUR CLOUDY (A) 11/16/2023 1210   LABSPEC 1.016 11/16/2023 1210   PHURINE 8.0 11/16/2023 1210   GLUCOSEU NEGATIVE 11/16/2023 1210   HGBUR SMALL (A) 11/16/2023 1210   BILIRUBINUR NEGATIVE 11/16/2023 1210   KETONESUR NEGATIVE  11/16/2023 1210   PROTEINUR 30 (A) 11/16/2023 1210   UROBILINOGEN 0.2 06/19/2014 2131   NITRITE POSITIVE (A) 11/16/2023 1210   LEUKOCYTESUR TRACE (A) 11/16/2023 1210   Sepsis Labs: Invalid input(s): "PROCALCITONIN", "LACTICIDVEN"  Microbiology: No results found for this or any previous visit (from the past 240 hours).  Radiology Studies: CT Angio Aortobifemoral W and/or Wo Contrast Result Date: 04/08/2024 CLINICAL DATA:  Claudication/leg ischemia EXAM: CT ANGIOGRAPHY OF ABDOMINAL AORTA WITH ILIOFEMORAL RUNOFF TECHNIQUE: Multidetector CT imaging of the abdomen, pelvis and lower extremities was performed using the  standard protocol during bolus administration of intravenous contrast. Multiplanar CT image reconstructions and MIPs were obtained to evaluate the vascular anatomy. RADIATION DOSE REDUCTION: This exam was performed according to the departmental dose-optimization program which includes automated exposure control, adjustment of the mA and/or kV according to patient size and/or use of iterative reconstruction technique. CONTRAST:  OMNIPAQUE  IOHEXOL  350 MG/ML SOLN COMPARISON:  CT abdomen pelvis 03/06/2022 FINDINGS: VASCULAR Aorta: Aortic atherosclerotic calcification without hemodynamically significant stenosis. No aneurysm or dissection. Celiac: No hemodynamically significant stenosis. No aneurysm or dissection. SMA: No hemodynamically significant stenosis. No aneurysm or dissection. Renals: No hemodynamically significant stenosis. No aneurysm or dissection. IMA: No hemodynamically significant stenosis. No aneurysm or dissection. RIGHT Lower Extremity Inflow: No hemodynamically significant stenosis. No aneurysm or dissection. Outflow: Common, superficial, and profunda femoral arteries are patent without aneurysm or dissection. Irregular atherosclerotic plaque in the popliteal artery causes up to moderate narrowing. Runoff: Poorly assessed due to slow flow and peripheral calcification. LEFT Lower Extremity Inflow: No hemodynamically significant stenosis. No aneurysm or dissection. Outflow: The common femoral artery is occluded at its bifurcation into the superficial and profunda femoral arteries which are also occluded. The popliteal artery is not opacified. Runoff: No opacification of the lower extremity arteries. Veins: No obvious venous abnormality within the limitations of this arterial phase study. Review of the MIP images confirms the above findings. NON-VASCULAR Lower chest: No acute abnormality. Hepatobiliary: Dilated common bile duct measuring 12 mm in diameter. No radiopaque stone. No focal hepatic  lesion. Pancreas: Unremarkable. Spleen: Unremarkable. Adrenals/Urinary Tract: Stable adrenal glands. Delayed left nephrogram. New marked left hydroureteronephrosis. Soft tissue thickening at the left UVJ/bladder wall is partially obscured by streak artifact (circa series 5/image 177). No radiopaque stone. Stomach/Bowel: No bowel wall thickening or obstruction. Stomach and appendix are within normal limits. Lymphatic: No lymphadenopathy. Reproductive: Status post hysterectomy. No adnexal mass. The vaginal cuff is poorly evaluated due to streak artifact. Other: Small volume free fluid in the pelvis. No free intraperitoneal air. Musculoskeletal: IM rod and screw fixation right femur. No acute fracture. Chronic compression fractures of T9, L1, and L3. Vertebroplasty of T11 and T12. IMPRESSION: 1. 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 The superficial, profunda, and popliteal arteries are not opacified. No opacification of the lower extremity arteries. 2. New marked left hydroureteronephrosis. Soft tissue thickening at the left UVJ/bladder wall is partially obscured by streak artifact and is suspicious for occlusion secondary to infiltrative malignancy. Urology consult recommended. 3. Dilated common bile duct is new since 03/06/2022. Recommend correlation with LFTs. 4. Small volume free fluid in the pelvis. 5. Aortic Atherosclerosis (ICD10-I70.0). Critical Value/emergent results were called by telephone at the time of interpretation on 04/08/2024 at 9:51 pm to provider MELANIE BELFI , who verbally acknowledged these results. Electronically Signed   By: Rozell Cornet M.D.   On: 04/08/2024 21:51      Lyndal Alamillo  Eve Hinders [facility]  If 7PM-7AM, please contact night-coverage [email or web address] 04/09/2024, 10:38 AM

## 2024-04-09 NOTE — Progress Notes (Signed)
 Christina Morgan 4E24- [facility] Patient Visit  [name] is a current hospice patient, with hospice diagnosis of chronic diastolic congestive heart failure. She was admitted 04/09/2024 with diagnosis of ischemic leg. [facility] was notified patient's facility and daughter of patient's change in condition and an Estate manager/land agent made a visit to the patient prior to EMS being called. Per Dr. Tessie Morgan, hospice physician, this is a related hospital admission.   Visited with patient in the hospital and spoke with daughter by phone. Patient denies pain at this time but recalls the extreme pain she experienced yesterday prior to the procedure to remove the vascular occlusion. Discussed plan of care with daughter briefly and anticipate discussing further later today.    Patient is GIP appropriate for evaluation and treatment of vascular occlusion.  Vital Signs: 97.5/83/20   145/84   O2 100% on 2LPM via [place]  I&O: 1177.9/825  Abnormal labs: BUN 34, 28, Cre 1.44, 1.31, GFRe 35, 39, WBC 10.8, PT 16.4, INR 1.3  Diagnostics:  CT ANGIOGRAPHY OF ABDOMINAL AORTA WITH ILIOFEMORAL RUNOFF  IMPRESSION: 1. 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 The superficial, profunda, and popliteal arteries are not opacified. No opacification of the lower extremity arteries. 2. New marked left hydroureteronephrosis. Soft tissue thickening at the left UVJ/bladder wall is partially obscured by streak artifact and is suspicious for occlusion secondary to infiltrative malignancy. Urology consult recommended. 3. Dilated common bile duct is new since 03/06/2022. Recommend correlation with LFTs. 4. Small volume free fluid in the pelvis. 5. Aortic Atherosclerosis (ICD10-I70.0).  IV/PRN Meds: DFLR 16ml/hr (has been discontinued), Heparin bolus 4000units IV x 1, Heparin infusion, currently 1100 units /hr.     Assessment and Plan from note 4.29.25 Critical left lower extremity ischemia: CT angio bilateral aortofemoral runoff showed cclusion of the left common femoral artery at its bifurcation into the superficial and profunda femoral arteries with no opacification of of the superficial profunda, and popliteal arteries and lower extremity arteries.   -S/p left common femoral, profunda, superficial femoral, popliteal artery embolectomy -Continue IV heparin -Discontinue SCD   Paroxysmal A-fib with RVR: Mild RVR to 120s this morning.  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  Creatinine improving.  No flank pain or distress.  Discussed with patient's daughter, Christina Morgan about urology consultation.  Christina Morgan would like to discuss with her brother and hospice before making decision.  She says she will be up here to meet with hospice in the next 1 to 2 hours. -Hold off urology consultation pending family decision.   Chronic HFpEF: Appears euvolemic on exam.  No cardiopulmonary symptoms or distress. -Continue holding Lasix . -Monitor fluid and respiratory status - Discontinue echocardiogram.  Doubt utility.   CKD-3B: Creatinine better than baseline.     - Continue holding Lasix      History of endometrial cancer s/p TAH and BSO in 2013.  Discharge Planning: Ongoing  Family Contact: spoke with daughter, Christina Morgan by phone.  IDT: Updated  Goals of Care: DNR  Christina Morgan BSN, RN, [facility] hospital liaison [phone]

## 2024-04-09 NOTE — Consult Note (Signed)
 Urology Consult Note   Requesting Attending Physician:  Theadore Finger, MD Service Providing Consult: Urology  Consulting Attending: Dr. Dulcy Gibney   Reason for Consult:  left hydronephrosis  HPI: Christina Morgan is seen in consultation for reasons noted above at the request of Theadore Finger, MD. patient is an 89 year old female presently on hospice for CHF, CKD 3B, paroxysmal A-fib, and HTN.  Patient was previously on Eliquis , this medication had been appropriately discontinued and her transition to hospice, unfortunately resulting in critical leg ischemia.  She presented to [facility] via EMS with a pulseless wife called left foot.  She ultimately underwent left common femoral, profunda, superficial femoral, and popliteal artery embolectomy with vascular surgery.  In the process of this imaging patient was noted to have severe left-sided hydronephrosis, questionably from infiltrative malignancy.  [facility] was [facility] to speak to this finding.  On arrival pt was sleeping but was awoken easily. I called her daughter, who joined us  by phone. All questions were answered to her satisfaction.   ------------------  Assessment:  89 y.o. female with left hydronephrosis   Recommendations: #severe left side hydronephrosis #CKD  This was listed as a new finding, however there was a notable amount of hydronephrosis observed on the renal ultrasound collected this past November. Patient has evidence of advanced kidney disease with significant bilateral cortical thinning. The daughter was of the understanding that her mother was under hospice care and there would be no other surgical considerations. She will need to stay on blood thinners after her vascular procedure. Pausing them for biopsy of surgical intervention would be inadvisable. Considering her advanced age and the fact that her renal function is better than it has been in some time, it would not be reasonable to consider  invasive surgical intervention, which would almost certainly do more to shorten her life in quality, if not also time. We also discussed less invasive procedures such as ureteral stent, but again, this would have her needing to come out of hospice care every 3 months for anesthesia and stent exchange, contrary to the comfort based care plan. The family was in agreement to stay the course with hospice and will reach out to [facility] if there is a change in plan or additional questions.   Case and plan discussed with Dr. Dulcy Gibney  Past Medical History: Past Medical History:  Diagnosis Date   Cancer [facility])    endometrial ca   Chronic diastolic CHF (congestive heart failure) (HCC)    Chronic edema    CKD (chronic kidney disease), stage III (HCC)    Dyslipidemia    Fracture of left humerus    History of cardiovascular stress test    Lexiscan  Myoview  (06/2014): No ischemia or scar, not gated, low risk   Hx of echocardiogram    Echo (05/02/14): EF 60% to 65%. No regional wall motion abnormalities. Mild AI, mildly dilated aortic root (37 mm), trivial MR, trivial TR   Hyperlipidemia    Hypertension    Lipoma of skin    multiple   Osteopenia    PAF (paroxysmal atrial fibrillation) (HCC)    failed DCCV 05/2014 >> Flecainide  started >> DCCV 7/15 sucessful;  f/u ETT neg for pro-arrhythmia >> recurrent AF/AFL >> Flecainide  inc to 100 bid with repeat DCCV 9/15   Vitamin D  deficiency 09/25/2019    Past Surgical History:  Past Surgical History:  Procedure Laterality Date   CARDIOVERSION N/A 06/05/2014   Procedure: CARDIOVERSION;  Surgeon: Sophia Dustman  Janeece Mechanic, MD;  Location: [facility] ENDOSCOPY;  Service: Cardiovascular;  Laterality: N/A;   CARDIOVERSION N/A 07/11/2014   Procedure: CARDIOVERSION;  Surgeon: Jacqueline Matsu, MD;  Location: [facility] ENDOSCOPY;  Service: Cardiovascular;  Laterality: N/A;   CARDIOVERSION N/A 09/04/2014   Procedure: CARDIOVERSION;  Surgeon: Jacqueline Matsu, MD;  Location: [facility] ENDOSCOPY;   Service: Cardiovascular;  Laterality: N/A;   CARDIOVERSION N/A 05/04/2022   Procedure: CARDIOVERSION;  Surgeon: Wendie Hamburg, MD;  Location: [facility] ENDOSCOPY;  Service: Cardiovascular;  Laterality: N/A;   COLONOSCOPY  2008   COLONOSCOPY N/A 05/20/2015   Procedure: COLONOSCOPY;  Surgeon: Lanita Pitman, MD;  Location: [facility] ENDOSCOPY;  Service: Endoscopy;  Laterality: N/A;   EMBOLECTOMY Left 04/08/2024   Procedure: RIGHT Femoral,Popliteal, profunda EMBOLECTOMY;  Surgeon: Kayla Part, MD;  Location: [facility] OR;  Service: Vascular;  Laterality: Left;  LEFT LEG FEMORAL EMBOLECTOMY   FEMUR IM NAIL Right 09/24/2019   Procedure: INTRAMEDULLARY (IM) NAIL FEMORAL;  Surgeon: Hardy Lia, MD;  Location: [facility] OR;  Service: Orthopedics;  Laterality: Right;   IR KYPHO EA ADDL LEVEL THORACIC OR LUMBAR  04/12/2019   IR KYPHO THORACIC WITH BONE BIOPSY  04/12/2019   ROBOTIC ASSISTED SUPRACERVICAL HYSTERECTOMY WITH BILATERAL SALPINGO OOPHERECTOMY  11/06/2012   and bilateral pelvic lymph node dissection    Medication: Current Facility-Administered Medications  Medication Dose Route Frequency Provider Last Rate Last Admin   acetaminophen  (TYLENOL ) tablet 650 mg  650 mg Oral Q6H PRN Schuh, McKenzi P, PA-C       Or   acetaminophen  (TYLENOL ) suppository 650 mg  650 mg Rectal Q6H PRN Schuh, McKenzi P, PA-C       atorvastatin  (LIPITOR) tablet 10 mg  10 mg Oral QHS Sundil, Subrina, MD       diltiazem  (CARDIZEM  CD) 24 hr capsule 300 mg  300 mg Oral Daily Gonfa, Taye T, MD   300 mg at 04/09/24 0802   heparin ADULT infusion 100 units/mL (25000 units/250mL)  850 Units/hr Intravenous Continuous Chen, Lydia D, RPH 8.5 mL/hr at 04/09/24 1414 850 Units/hr at 04/09/24 1414   HYDROmorphone  (DILAUDID ) injection 0.5-1 mg  0.5-1 mg Intravenous Q4H PRN Schuh, McKenzi P, PA-C       metoprolol  tartrate (LOPRESSOR ) tablet 25 mg  25 mg Oral BID Gonfa, Taye T, MD   25 mg at 04/09/24 0802   ondansetron  (ZOFRAN ) tablet 4 mg  4 mg Oral Q6H  PRN Schuh, McKenzi P, PA-C       Or   ondansetron  (ZOFRAN ) injection 4 mg  4 mg Intravenous Q6H PRN Schuh, McKenzi P, PA-C       oxyCODONE  (Oxy IR/ROXICODONE ) immediate release tablet 5-10 mg  5-10 mg Oral Q4H PRN Schuh, McKenzi P, PA-C       pantoprazole  (PROTONIX ) EC tablet 40 mg  40 mg Oral Daily Sundil, Subrina, MD   40 mg at 04/09/24 0802   potassium chloride  SA (KLOR-CON  M) CR tablet 20-40 mEq  20-40 mEq Oral Once Schuh, McKenzi P, PA-C       sodium chloride  flush (NS) 0.9 % injection 3 mL  3 mL Intravenous Q12H Schuh, McKenzi P, PA-C        Allergies: Allergies  Allergen Reactions   Penicillins Itching and Other (See Comments)    Dizziness to the patient of passing out- told by MD to "never take this again" Did it involve swelling of the face/tongue/throat, SOB, or low BP? No Did it involve sudden or severe rash/hives, skin peeling, or any  reaction on the inside of your mouth or nose? No Did you need to seek medical attention at a hospital or doctor's office? No When did it last happen? "a long time ago"    If all above answers are "NO", may proceed with cephalosporin use.     Social History: Social History   Tobacco Use   Smoking status: Never    Passive exposure: Past   Smokeless tobacco: Never  Vaping Use   Vaping status: Never Used  Substance Use Topics   Alcohol use: No   Drug use: No    Family History Family History  Problem Relation Age of Onset   Cancer Father 44       metastatic oropharyngeal ca   Heart attack Mother    Coronary artery disease Mother     Review of Systems  Unable to perform ROS: Dementia     Objective   Vital signs in last 24 hours: BP (!) 123/105 (BP Location: Left Arm)   Pulse 95   Temp 98.3 F (36.8 C) (Oral)   Resp 17   Ht 5\' 4"  (1.626 m)   Wt 66 kg   SpO2 100%   BMI 24.98 kg/m   Physical Exam General: Alert, confused HEENT: /AT Pulmonary: Normal work of breathing Cardiovascular: no cyanosis   Most Recent  Labs: Lab Results  Component Value Date   WBC 10.8 (H) 04/09/2024   HGB 12.0 04/09/2024   HCT 36.8 04/09/2024   PLT 231 04/09/2024    Lab Results  Component Value Date   NA 139 04/09/2024   K 4.2 04/09/2024   CL 99 04/09/2024   CO2 25 04/09/2024   BUN 28 (H) 04/09/2024   CREATININE 1.31 (H) 04/09/2024   CALCIUM  9.3 04/09/2024   MG 2.5 (H) 11/21/2023   PHOS 5.7 (H) 11/13/2023    Lab Results  Component Value Date   INR 1.3 (H) 04/09/2024     Urine Culture: @[id number] (laburin,org,[id number])@   IMAGING: CT Angio Aortobifemoral W and/or Wo Contrast Result Date: 04/08/2024 CLINICAL DATA:  Claudication/leg ischemia EXAM: CT ANGIOGRAPHY OF ABDOMINAL AORTA WITH ILIOFEMORAL RUNOFF TECHNIQUE: Multidetector CT imaging of the abdomen, pelvis and lower extremities was performed using the standard protocol during bolus administration of intravenous contrast. Multiplanar CT image reconstructions and MIPs were obtained to evaluate the vascular anatomy. RADIATION DOSE REDUCTION: This exam was performed according to the departmental dose-optimization program which includes automated exposure control, adjustment of the mA and/or kV according to patient size and/or use of iterative reconstruction technique. CONTRAST:  OMNIPAQUE  IOHEXOL  350 MG/ML SOLN COMPARISON:  CT abdomen pelvis 03/06/2022 FINDINGS: VASCULAR Aorta: Aortic atherosclerotic calcification without hemodynamically significant stenosis. No aneurysm or dissection. Celiac: No hemodynamically significant stenosis. No aneurysm or dissection. SMA: No hemodynamically significant stenosis. No aneurysm or dissection. Renals: No hemodynamically significant stenosis. No aneurysm or dissection. IMA: No hemodynamically significant stenosis. No aneurysm or dissection. RIGHT Lower Extremity Inflow: No hemodynamically significant stenosis. No aneurysm or dissection. Outflow: Common, superficial, and profunda femoral arteries are patent without  aneurysm or dissection. Irregular atherosclerotic plaque in the popliteal artery causes up to moderate narrowing. Runoff: Poorly assessed due to slow flow and peripheral calcification. LEFT Lower Extremity Inflow: No hemodynamically significant stenosis. No aneurysm or dissection. Outflow: The common femoral artery is occluded at its bifurcation into the superficial and profunda femoral arteries which are also occluded. The popliteal artery is not opacified. Runoff: No opacification of the lower extremity arteries. Veins: No obvious venous abnormality within the  limitations of this arterial phase study. Review of the MIP images confirms the above findings. NON-VASCULAR Lower chest: No acute abnormality. Hepatobiliary: Dilated common bile duct measuring 12 mm in diameter. No radiopaque stone. No focal hepatic lesion. Pancreas: Unremarkable. Spleen: Unremarkable. Adrenals/Urinary Tract: Stable adrenal glands. Delayed left nephrogram. New marked left hydroureteronephrosis. Soft tissue thickening at the left UVJ/bladder wall is partially obscured by streak artifact (circa series 5/image 177). No radiopaque stone. Stomach/Bowel: No bowel wall thickening or obstruction. Stomach and appendix are within normal limits. Lymphatic: No lymphadenopathy. Reproductive: Status post hysterectomy. No adnexal mass. The vaginal cuff is poorly evaluated due to streak artifact. Other: Small volume free fluid in the pelvis. No free intraperitoneal air. Musculoskeletal: IM rod and screw fixation right femur. No acute fracture. Chronic compression fractures of T9, L1, and L3. Vertebroplasty of T11 and T12. IMPRESSION: 1. 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 The superficial, profunda, and popliteal arteries are not opacified. No opacification of the lower extremity arteries. 2. New marked left hydroureteronephrosis. Soft tissue thickening at the left UVJ/bladder  wall is partially obscured by streak artifact and is suspicious for occlusion secondary to infiltrative malignancy. Urology consult recommended. 3. Dilated common bile duct is new since 03/06/2022. Recommend correlation with LFTs. 4. Small volume free fluid in the pelvis. 5. Aortic Atherosclerosis (ICD10-I70.0). Critical Value/emergent results were called by telephone at the time of interpretation on 04/08/2024 at 9:51 pm to provider MELANIE BELFI , who verbally acknowledged these results. Electronically Signed   By: Rozell Cornet M.D.   On: 04/08/2024 21:51    ------  Alla Ar, NP Pager: [phone]   Please contact the urology consult pager with any further questions/concerns.

## 2024-04-09 NOTE — Plan of Care (Signed)

## 2024-04-09 NOTE — Progress Notes (Signed)
 Christina Morgan [phone]- [facility] liaison note     This patient is a current hospice patient with [facility]. Please see media tab for detailed hospice report.    Liaison will continue to follow for any discharge planning needs and to coordinate continuation of hospice care.    Please don't hesitate to call with any Hospice related questions or concerns.    Thank you for the opportunity to participate in this patient's care.  [name], BSN, RN, OCN [employer] [phone]

## 2024-04-10 ENCOUNTER — Inpatient Hospital Stay (HOSPITAL_COMMUNITY)

## 2024-04-10 DIAGNOSIS — I4891 Unspecified atrial fibrillation: Secondary | ICD-10-CM | POA: Diagnosis not present

## 2024-04-10 DIAGNOSIS — N1832 Chronic kidney disease, stage 3b: Secondary | ICD-10-CM | POA: Diagnosis not present

## 2024-04-10 DIAGNOSIS — E785 Hyperlipidemia, unspecified: Secondary | ICD-10-CM | POA: Diagnosis not present

## 2024-04-10 DIAGNOSIS — I10 Essential (primary) hypertension: Secondary | ICD-10-CM | POA: Diagnosis not present

## 2024-04-10 DIAGNOSIS — I998 Other disorder of circulatory system: Secondary | ICD-10-CM | POA: Diagnosis not present

## 2024-04-10 LAB — CBC
HCT: 29.3 % — ABNORMAL LOW (ref 36.0–46.0)
HCT: 26.7 % — ABNORMAL LOW (ref 36.0–46.0)
Hemoglobin: 9.2 g/dL — ABNORMAL LOW (ref 12.0–15.0)
Hemoglobin: 8.5 g/dL — ABNORMAL LOW (ref 12.0–15.0)
MCH: 30.1 pg (ref 26.0–34.0)
MCH: 30.6 pg (ref 26.0–34.0)
MCHC: 31.4 g/dL (ref 30.0–36.0)
MCHC: 31.8 g/dL (ref 30.0–36.0)
MCV: 95.8 fL (ref 80.0–100.0)
MCV: 96 fL (ref 80.0–100.0)
Platelets: 226 10*3/uL (ref 150–400)
Platelets: 237 10*3/uL (ref 150–400)
RBC: 3.06 MIL/uL — ABNORMAL LOW (ref 3.87–5.11)
RBC: 2.78 MIL/uL — ABNORMAL LOW (ref 3.87–5.11)
RDW: 16.1 % — ABNORMAL HIGH (ref 11.5–15.5)
RDW: 16.1 % — ABNORMAL HIGH (ref 11.5–15.5)
WBC: 9.6 10*3/uL (ref 4.0–10.5)
WBC: 9.2 10*3/uL (ref 4.0–10.5)
nRBC: 0 % (ref 0.0–0.2)
nRBC: 0 % (ref 0.0–0.2)

## 2024-04-10 LAB — RENAL FUNCTION PANEL
Albumin: 3 g/dL — ABNORMAL LOW (ref 3.5–5.0)
Anion gap: 9 (ref 5–15)
BUN: 30 mg/dL — ABNORMAL HIGH (ref 8–23)
CO2: 29 mmol/L (ref 22–32)
Calcium: 8.9 mg/dL (ref 8.9–10.3)
Chloride: 102 mmol/L (ref 98–111)
Creatinine, Ser: 1.35 mg/dL — ABNORMAL HIGH (ref 0.44–1.00)
GFR, Estimated: 38 mL/min — ABNORMAL LOW (ref >60)
Glucose, Bld: 111 mg/dL — ABNORMAL HIGH (ref 70–99)
Phosphorus: 3.7 mg/dL (ref 2.5–4.6)
Potassium: 4.1 mmol/L (ref 3.5–5.1)
Sodium: 140 mmol/L (ref 135–145)

## 2024-04-10 LAB — MAGNESIUM: Magnesium: 1.9 mg/dL (ref 1.7–2.4)

## 2024-04-10 LAB — ECHOCARDIOGRAM COMPLETE
AR max vel: 1.17 cm2
AV Area VTI: 1.16 cm2
AV Area mean vel: 1.18 cm2
AV Mean grad: 7 mmHg
AV Peak grad: 15.5 mmHg
Ao pk vel: 1.97 m/s
Area-P 1/2: 4.86 cm2
Height: 64 in
P 1/2 time: 639 ms
S' Lateral: 2.2 cm
Weight: 2328.06 [oz_av]

## 2024-04-10 LAB — HEPARIN LEVEL (UNFRACTIONATED)
Heparin Unfractionated: >1.1 [IU]/mL — ABNORMAL HIGH (ref 0.30–0.70)
Heparin Unfractionated: >1.1 [IU]/mL — ABNORMAL HIGH (ref 0.30–0.70)

## 2024-04-10 LAB — APTT: aPTT: 143 s — ABNORMAL HIGH (ref 24–36)

## 2024-04-10 MED ORDER — HEPARIN (PORCINE) 25000 UT/250ML-% IV SOLN
550.0000 [IU]/h | INTRAVENOUS | Status: DC
  Administered 2024-04-11: 550 [IU]/h via INTRAVENOUS
  Filled 2024-04-10: qty 250

## 2024-04-10 MED ORDER — PERFLUTREN LIPID MICROSPHERE
1.0000 mL | INTRAVENOUS | Status: AC | PRN
  Administered 2024-04-10: 2 mL via INTRAVENOUS

## 2024-04-10 MED ORDER — HEPARIN (PORCINE) 25000 UT/250ML-% IV SOLN: 700.0000 [IU]/h | INTRAVENOUS | Status: DC

## 2024-04-10 NOTE — Progress Notes (Signed)
 Echocardiogram 2D Echocardiogram has been performed.  Christina Morgan 04/10/2024, 1:42 PM

## 2024-04-10 NOTE — Progress Notes (Signed)
 PROGRESS NOTE  Christina Morgan:[id number] DOB: 02/14/1933   PCP: Cole Daubs, [facility]  Patient is from: [name].  Followed by [facility] hospice.   DOA: 04/08/2024 LOS: 2  Chief complaints Chief Complaint  Patient presents with   Leg Pain     Brief Narrative / Interim history: 89 year old F followed by hospice with PMH of HFpEF, CKD-3B, PAF off Eliquis , endometrial cancer  s/p TAH/BSO in 2013, HTN, HLD and GERD brought to ED by EMS due to left leg pain and found to have occlusion of left common femoral artery at its bifurcation into the superficial and profundofemoral arteries with no opacification of the superficial profunda and popliteal arteries and lower extremity.  Patient was off Eliquis  since she transition to hospice.  She is sedentary.  Vascular surgery consulted.  Patient underwent left common femoral, profunda, superficial femoral, popliteal artery embolectomy, and started on heparin.  CT angio also showed new marked left hydroureteronephrosis with soft tissue thickening at the left UVJ/bladder wall suspicious for occlusion secondary to infiltrative malignancy and dilated CBD.  Urology recommended against urological intervention since patient is on anticoagulation and enrolled with hospice.  Hemoglobin dropped about 4 g without overt bleeding other than some hematuria.    Subjective: Seen and examined earlier this morning.  No major events overnight of this morning.  No complaints.  Hemoglobin dropped about 4 g since admission.  No overt bleeding other than some hematuria when she had a fall yesterday.  Foley catheter removed yesterday. Reports voiding without problem.  Denies dysuria.    Objective: Vitals:   04/10/24 0256 04/10/24 0805 04/10/24 0834 04/10/24 1226  BP: 120/68 97/61 114/82 111/72  Pulse: 85 94 84 89  Resp: 16 16 19 19   Temp: 97.6 F (36.4 C) 97.7 F (36.5 C)  97.6 F (36.4 C)  TempSrc: Oral Oral  Oral  SpO2: 100% 99% 99%  100%  Weight:      Height:        Examination:  GENERAL: No apparent distress.  Nontoxic. HEENT: MMM.  Vision and hearing grossly intact.  NECK: Supple.  No apparent JVD.  RESP:  No IWOB.  Fair aeration bilaterally. CVS:  RRR. Heart sounds normal.  ABD/GI/GU: BS+. Abd soft, NTND.   MSK/EXT:  Moves extremities. No apparent deformity.  TED hose in place.  SKIN: no apparent skin lesion or wound NEURO: Awake, alert and oriented appropriately.  No apparent focal neuro deficit. PSYCH: Calm. Normal affect.   Consultants:  Vascular surgery Urology  Procedures: 4/29- left common femoral, profunda, superficial femoral, popliteal artery embolectomy  Microbiology summarized: None  Assessment and plan: Critical left lower extremity ischemia: CT angio bilateral aortofemoral runoff showed cclusion of the left common femoral artery at its bifurcation into the superficial and profunda femoral arteries with no opacification of of the superficial profunda, and popliteal arteries and lower extremity arteries.   -S/p left common femoral, profunda, superficial femoral, popliteal artery embolectomy -Continue IV heparin and TED hose.   Paroxysmal A-fib with RVR: RVR resolved.  Was off Eliquis  -Continue home Cardizem  CD and Toprol  -On IV heparin for anticoagulation  Left hydronephrosis with possible infiltrative malignancy of left UVJ.  Cr improving.  No flank pain or distress.  -Urology advised against surgical intervention on anticoagulation and hospice, and Cr is stable.  Chronic HFpEF: Appears euvolemic on exam.  No cardiopulmonary symptoms or distress. -Continue holding Lasix . -Monitor fluid and respiratory status - Follow echocardiogram.  CKD-3B: Cr better than baseline. Recent  Labs    11/16/23 0234 11/17/23 0244 11/18/23 0227 11/18/23 1441 11/19/23 0256 11/20/23 0254 11/21/23 0241 04/08/24 2039 04/09/24 0440 04/10/24 0334  BUN 47* 50* 51* 54* 47* 44* 45* 34* 28* 30*   CREATININE 1.87* 1.91* 2.12* 2.33* 2.07* 2.00* 1.87* 1.44* 1.31* 1.35*  - Continue holding Lasix   Anemia of renal disease: Hgb dropped about 3 g.  Baseline appears to be 11-12.  Some hematuria when she had a Foley in place.  No other overt bleeding. Recent Labs    11/08/23 1223 11/09/23 0230 11/15/23 0257 11/16/23 0234 11/17/23 0244 11/18/23 0227 11/20/23 0254 04/08/24 2039 04/09/24 0440 04/10/24 0334  HGB 10.8* 10.0* 11.6* 11.7* 11.6* 12.0 11.7* 13.2 12.0 9.2*  - Recheck CBC this afternoon  Dilated CBD: No GI symptoms.  LFT within normal.  Essential hypertension: BP slightly elevated -Home Cardizem  CD and metoprolol   GERD -Continue Protonix    Hyperlipidemia -Continue Lipitor  History of endometrial cancer s/p TAH and BSO in 2013.   Advance care planning: DNR limited.  -[facility] hospice following  Body mass index is 24.98 kg/m.    DVT prophylaxis:  Place TED hose Start: 04/09/24 0050 Place TED hose Start: 04/08/24 2250  Code Status: DNR-Limited Family Communication: None at bedside today. Level of care: Progressive Status is: Inpatient Remains inpatient appropriate because: Critical limb ischemia   Final disposition: Likely back to SNF with hospice   55 minutes with more than 50% spent in reviewing records, counseling patient/family and coordinating care.   Sch Meds:  Scheduled Meds:  atorvastatin   10 mg Oral QHS   diltiazem   300 mg Oral Daily   metoprolol  tartrate  25 mg Oral BID   pantoprazole   40 mg Oral Daily   potassium chloride   20-40 mEq Oral Once   sodium chloride  flush  3 mL Intravenous Q12H   Continuous Infusions:  heparin 700 Units/hr (04/10/24 0604)   PRN Meds:.acetaminophen  **OR** acetaminophen , HYDROmorphone  (DILAUDID ) injection, ondansetron  **OR** ondansetron  (ZOFRAN ) IV, oxyCODONE , perflutren  lipid microspheres (DEFINITY ) IV suspension  Antimicrobials: Anti-infectives (From admission, onward)    None        I have  personally reviewed the following labs and images: CBC: Recent Labs  Lab 04/08/24 2039 04/09/24 0440 04/10/24 0334  WBC 10.1 10.8* 9.6  NEUTROABS 8.7*  --   --   HGB 13.2 12.0 9.2*  HCT 40.5 36.8 29.3*  MCV 94.6 93.4 95.8  PLT 248 231 226   BMP &GFR Recent Labs  Lab 04/08/24 2039 04/09/24 0440 04/10/24 0334  NA 139 139 140  K 3.7 4.2 4.1  CL 102 99 102  CO2 23 25 29   GLUCOSE 175* 143* 111*  BUN 34* 28* 30*  CREATININE 1.44* 1.31* 1.35*  CALCIUM  9.2 9.3 8.9  MG  --   --  1.9  PHOS  --   --  3.7   Estimated Creatinine Clearance: 26.4 mL/min (A) (by C-G formula based on SCr of 1.35 mg/dL (H)). Liver & Pancreas: Recent Labs  Lab 04/09/24 0440 04/10/24 0334  AST 26  --   ALT 11  --   ALKPHOS 55  --   BILITOT 0.6  --   PROT 6.1*  --   ALBUMIN 3.5 3.0*   No results for input(s): "LIPASE", "AMYLASE" in the last 168 hours. No results for input(s): "AMMONIA" in the last 168 hours. Diabetic: No results for input(s): "HGBA1C" in the last 72 hours. No results for input(s): "GLUCAP" in the last 168 hours. Cardiac Enzymes: No results  for input(s): "CKTOTAL", "CKMB", "CKMBINDEX", "TROPONINI" in the last 168 hours. No results for input(s): "PROBNP" in the last 8760 hours. Coagulation Profile: Recent Labs  Lab 04/09/24 0440  INR 1.3*   Thyroid  Function Tests: No results for input(s): "TSH", "T4TOTAL", "FREET4", "T3FREE", "THYROIDAB" in the last 72 hours. Lipid Profile: No results for input(s): "CHOL", "HDL", "LDLCALC", "TRIG", "CHOLHDL", "LDLDIRECT" in the last 72 hours. Anemia Panel: No results for input(s): "VITAMINB12", "FOLATE", "FERRITIN", "TIBC", "IRON", "RETICCTPCT" in the last 72 hours. Urine analysis:    Component Value Date/Time   COLORURINE RED (A) 04/09/2024 2201   APPEARANCEUR TURBID (A) 04/09/2024 2201   LABSPEC  04/09/2024 2201    TEST NOT REPORTED DUE TO COLOR INTERFERENCE OF URINE PIGMENT   PHURINE  04/09/2024 2201    TEST NOT REPORTED DUE TO  COLOR INTERFERENCE OF URINE PIGMENT   GLUCOSEU (A) 04/09/2024 2201    TEST NOT REPORTED DUE TO COLOR INTERFERENCE OF URINE PIGMENT   HGBUR (A) 04/09/2024 2201    TEST NOT REPORTED DUE TO COLOR INTERFERENCE OF URINE PIGMENT   BILIRUBINUR (A) 04/09/2024 2201    TEST NOT REPORTED DUE TO COLOR INTERFERENCE OF URINE PIGMENT   KETONESUR (A) 04/09/2024 2201    TEST NOT REPORTED DUE TO COLOR INTERFERENCE OF URINE PIGMENT   PROTEINUR (A) 04/09/2024 2201    TEST NOT REPORTED DUE TO COLOR INTERFERENCE OF URINE PIGMENT   UROBILINOGEN 0.2 06/19/2014 2131   NITRITE (A) 04/09/2024 2201    TEST NOT REPORTED DUE TO COLOR INTERFERENCE OF URINE PIGMENT   LEUKOCYTESUR (A) 04/09/2024 2201    TEST NOT REPORTED DUE TO COLOR INTERFERENCE OF URINE PIGMENT   Sepsis Labs: Invalid input(s): "PROCALCITONIN", "LACTICIDVEN"  Microbiology: No results found for this or any previous visit (from the past 240 hours).  Radiology Studies: No results found.     Ryley Teater T. Camarion Weier [facility]  If 7PM-7AM, please contact night-coverage [email or web address] 04/10/2024, 2:26 PM

## 2024-04-10 NOTE — Progress Notes (Signed)
 ANTICOAGULATION CONSULT NOTE  Pharmacy Consult for Heparin Indication:  vascular occlusion  Allergies  Allergen Reactions   Penicillins Itching and Other (See Comments)    Dizziness to the patient of passing out- told by MD to "never take this again" Did it involve swelling of the face/tongue/throat, SOB, or low BP? No Did it involve sudden or severe rash/hives, skin peeling, or any reaction on the inside of your mouth or nose? No Did you need to seek medical attention at a hospital or doctor's office? No When did it last happen? "a long time ago"    If all above answers are "NO", may proceed with cephalosporin use.     Patient Measurements: Height: 5\' 4"  (162.6 cm) Weight: 66 kg (145 lb 8.1 oz) IBW/kg (Calculated) : 54.7 Heparin Dosing Weight: 66 kg  Vital Signs: Temp: 97.6 F (36.4 C) (04/30 0256) Temp Source: Oral (04/30 0256) BP: 120/68 (04/30 0256) Pulse Rate: 85 (04/30 0256)  Labs: Recent Labs    04/08/24 2039 04/09/24 0440 04/09/24 1153 04/09/24 2052 04/10/24 0334  HGB 13.2 12.0  --   --  9.2*  HCT 40.5 36.8  --   --  29.3*  PLT 248 231  --   --  226  LABPROT  --  16.4*  --   --   --   INR  --  1.3*  --   --   --   HEPARINUNFRC  --   --  >1.10* >1.10* >1.10*  CREATININE 1.44* 1.31*  --   --  1.35*    Estimated Creatinine Clearance: 26.4 mL/min (A) (by C-G formula based on SCr of 1.35 mg/dL (H)).   Medical History: Past Medical History:  Diagnosis Date   Cancer [facility])    endometrial ca   Chronic diastolic CHF (congestive heart failure) (HCC)    Chronic edema    CKD (chronic kidney disease), stage III (HCC)    Dyslipidemia    Fracture of left humerus    History of cardiovascular stress test    Lexiscan  Myoview  (06/2014): No ischemia or scar, not gated, low risk   Hx of echocardiogram    Echo (05/02/14): EF 60% to 65%. No regional wall motion abnormalities. Mild AI, mildly dilated aortic root (37 mm), trivial MR, trivial TR   Hyperlipidemia     Hypertension    Lipoma of skin    multiple   Osteopenia    PAF (paroxysmal atrial fibrillation) (HCC)    failed DCCV 05/2014 >> Flecainide  started >> DCCV 7/15 sucessful;  f/u ETT neg for pro-arrhythmia >> recurrent AF/AFL >> Flecainide  inc to 100 bid with repeat DCCV 9/15   Vitamin D  deficiency 09/25/2019     Assessment: 89 yo W on outpatient hospice with LLE arterial occlusion s/p embolectomy 4/29. No anticoagulation prior to admission (Apixaban  was removed when pt went on hospice). Pharmacy consulted for heparin.    Heparin ran at 500 units/hr until 4am then increased to 1100 units/hr. Heparin level >1.1 is supratherapeutic on 1100 units/hr.  No issues with infusion or bleeding per RN. Heparin running in R arm, level drawn from L arm. Patient has blood tinged urine at baseline per RN.   4/30 AM update:  Heparin level supra-therapeutic   Goal of Therapy:  Heparin level 0.3-0.7 units/ml Monitor platelets by anticoagulation protocol: Yes   Plan:  Hold heparin infusion for 1 hour and decrease to 700 units/hr Heparin level in 8 hours Monitor daily heparin level, CBC, signs/symptoms of bleeding  Silvestre Drum, PharmD, BCPS Clinical Pharmacist Phone: [phone]

## 2024-04-10 NOTE — Progress Notes (Signed)
  Progress Note    04/10/2024 6:47 AM 2 Days Post-Op  Subjective:  says her foot continues to feel much better  Afebrile   Vitals:   04/09/24 2359 04/10/24 0256  BP: 118/68 120/68  Pulse: 70 85  Resp: 13 16  Temp: 98 F (36.7 C) 97.6 F (36.4 C)  SpO2: 100% 100%    Physical Exam: General:  no distress Cardiac:  irregular Lungs:  non labored  Incisions:  left groin looks good Extremities:  + left PT doppler flow   CBC    Component Value Date/Time   WBC 9.6 04/10/2024 0334   RBC 3.06 (L) 04/10/2024 0334   HGB 9.2 (L) 04/10/2024 0334   HGB 11.6 09/19/2023 1635   HCT 29.3 (L) 04/10/2024 0334   HCT 36.9 09/19/2023 1635   PLT 226 04/10/2024 0334   PLT 293 09/19/2023 1635   MCV 95.8 04/10/2024 0334   MCV 89 09/19/2023 1635   MCH 30.1 04/10/2024 0334   MCHC 31.4 04/10/2024 0334   RDW 16.1 (H) 04/10/2024 0334   RDW 14.4 09/19/2023 1635   LYMPHSABS 0.7 04/08/2024 2039   LYMPHSABS 0.6 (L) 09/05/2017 1316   MONOABS 0.5 04/08/2024 2039   EOSABS 0.0 04/08/2024 2039   EOSABS 0.0 09/05/2017 1316   BASOSABS 0.0 04/08/2024 2039   BASOSABS 0.0 09/05/2017 1316    BMET    Component Value Date/Time   NA 140 04/10/2024 0334   NA 144 09/19/2023 1635   K 4.1 04/10/2024 0334   CL 102 04/10/2024 0334   CO2 29 04/10/2024 0334   GLUCOSE 111 (H) 04/10/2024 0334   BUN 30 (H) 04/10/2024 0334   BUN 22 09/19/2023 1635   CREATININE 1.35 (H) 04/10/2024 0334   CREATININE 0.82 03/21/2016 0915   CALCIUM  8.9 04/10/2024 0334   CALCIUM  8.0 (L) 09/26/2019 0654   GFRNONAA 38 (L) 04/10/2024 0334   GFRAA 31 (L) 10/13/2020 1420    INR    Component Value Date/Time   INR 1.3 (H) 04/09/2024 0440     Intake/Output Summary (Last 24 hours) at 04/10/2024 0647 Last data filed at 04/10/2024 0557 Gross per 24 hour  Intake 210.28 ml  Output 480 ml  Net -269.72 ml      Assessment/Plan:  89 y.o. female is s/p:  Left common femoral, profunda, superficial femoral, popliteal artery  embolectomy on 04/09/2024 by Dr. Rosalva Comber for acute LLE ischemia   2 Days Post-Op   -pt with + left PT doppler flow -2D echo ordered yesterday morning-this was cancelled and then reordered last evening but not completed yet -DVT prophylaxis:  heparin gtt -continue statin   Madinah Quarry, PA-C [facility] [phone] 04/10/2024 6:47 AM

## 2024-04-10 NOTE — Progress Notes (Signed)
 ANTICOAGULATION CONSULT NOTE  Pharmacy Consult for Heparin Indication:  vascular occlusion  Allergies  Allergen Reactions   Penicillins Itching and Other (See Comments)    Dizziness to the patient of passing out- told by MD to "never take this again" Did it involve swelling of the face/tongue/throat, SOB, or low BP? No Did it involve sudden or severe rash/hives, skin peeling, or any reaction on the inside of your mouth or nose? No Did you need to seek medical attention at a hospital or doctor's office? No When did it last happen? "a long time ago"    If all above answers are "NO", may proceed with cephalosporin use.     Patient Measurements: Height: 5\' 4"  (162.6 cm) Weight: 66 kg (145 lb 8.1 oz) IBW/kg (Calculated) : 54.7 Heparin Dosing Weight: 66 kg  Vital Signs: Temp: 97.7 F (36.5 C) (04/30 1601) Temp Source: Oral (04/30 1601) BP: 114/74 (04/30 1601) Pulse Rate: 89 (04/30 1601)  Labs: Recent Labs    04/08/24 2039 04/09/24 0440 04/09/24 1153 04/09/24 2052 04/10/24 0334 04/10/24 1519  HGB 13.2 12.0  --   --  9.2* 8.5*  HCT 40.5 36.8  --   --  29.3* 26.7*  PLT 248 231  --   --  226 237  LABPROT  --  16.4*  --   --   --   --   INR  --  1.3*  --   --   --   --   HEPARINUNFRC  --   --    < > >1.10* >1.10* >1.10*  CREATININE 1.44* 1.31*  --   --  1.35*  --    < > = values in this interval not displayed.    Estimated Creatinine Clearance: 26.4 mL/min (A) (by C-G formula based on SCr of 1.35 mg/dL (H)).   Medical History: Past Medical History:  Diagnosis Date   Cancer [facility])    endometrial ca   Chronic diastolic CHF (congestive heart failure) (HCC)    Chronic edema    CKD (chronic kidney disease), stage III (HCC)    Dyslipidemia    Fracture of left humerus    History of cardiovascular stress test    Lexiscan  Myoview  (06/2014): No ischemia or scar, not gated, low risk   Hx of echocardiogram    Echo (05/02/14): EF 60% to 65%. No regional wall motion  abnormalities. Mild AI, mildly dilated aortic root (37 mm), trivial MR, trivial TR   Hyperlipidemia    Hypertension    Lipoma of skin    multiple   Osteopenia    PAF (paroxysmal atrial fibrillation) (HCC)    failed DCCV 05/2014 >> Flecainide  started >> DCCV 7/15 sucessful;  f/u ETT neg for pro-arrhythmia >> recurrent AF/AFL >> Flecainide  inc to 100 bid with repeat DCCV 9/15   Vitamin D  deficiency 09/25/2019     Assessment: 89 yo W on outpatient hospice with LLE arterial occlusion s/p embolectomy 4/29. No anticoagulation prior to admission (Apixaban  was removed when pt went on hospice). Pharmacy consulted for heparin.    Heparin ran at 500 units/hr until 4am then increased to 1100 units/hr. Heparin level >1.1 is supratherapeutic on 1100 units/hr.  No issues with infusion or bleeding per RN. Heparin running in R arm, level drawn from L arm. Patient has blood tinged urine at baseline per RN.   Heparin level came back supratherapeutic again this PM. It was collected appropriately. Still on apix? We will add-on PTT.   Goal of Therapy:  Heparin level 0.3-0.7 units/ml Monitor platelets by anticoagulation protocol: Yes   Plan:  Hold heparin infusion for 2 hour and decrease to 550 units/hr Heparin level in 8 hours Monitor daily heparin level, CBC, signs/symptoms of bleeding   Ivery Marking, PharmD, BCIDP, AAHIVP, CPP Infectious Disease Pharmacist 04/10/2024 4:46 PM

## 2024-04-10 NOTE — TOC Progression Note (Signed)
 Transition of Care [facility]) - Progression Note    Patient Details  Name: Christina Morgan MRN: [id number] Date of Birth: 1933/05/27  Transition of Care [facility]) CM/SW Contact  Valery Gaucher, [place] Phone Number: 04/10/2024, 4:44 PM  Clinical Narrative:      CSW informed by Hospice RN John to contacted [facility] administration, Imane El Meski's  at [phone] . CSW contacted facility, informed patient is from building [facility], the [employer] # [phone]. CSW called, voice mail was full and unable to leave voice message.   CSW sent administrator email @ [email or web address] since unable to reach by phone.   TOC will continue to follow and assist with discharge planning.   Liddie Reel, MSW, LCSW Clinical Social Worker        Expected Discharge Plan and Services                                               Social Determinants of Health (SDOH) Interventions SDOH Screenings   Food Insecurity: No Food Insecurity (04/09/2024)  Housing: Low Risk  (04/09/2024)  Transportation Needs: No Transportation Needs (04/09/2024)  Utilities: Not At Risk (04/09/2024)  Financial Resource Strain: Low Risk  (09/27/2019)  Physical Activity: Inactive (09/27/2019)  Social Connections: Socially Isolated (04/09/2024)  Stress: No Stress Concern Present (09/27/2019)  Tobacco Use: Low Risk  (04/08/2024)    Readmission Risk Interventions    03/11/2022   11:40 AM  Readmission Risk Prevention Plan  Transportation Screening Complete  Medication Review (RN Care Manager) Complete  PCP or Specialist appointment within 3-5 days of discharge Complete  HRI or Home Care Consult Complete  SW Recovery Care/Counseling Consult Complete  Palliative Care Screening Not Applicable  Skilled Nursing Facility Not Applicable

## 2024-04-10 NOTE — Progress Notes (Addendum)
        Christina Morgan 4E24- [facility] Hospitalized [facility]ospice Patient Visit   [name] is a current hospice patient, with hospice diagnosis of chronic diastolic congestive heart failure. She was admitted 04/09/2024 with diagnosis of ischemic leg. [facility] was notified patient's facility and daughter of patient's change in condition and an Estate manager/land agent made a visit to the patient prior to EMS being called. Per Dr. Tessie Fila, hospice physician, this is a related hospital admission.    Visited with patient in the hospital and communicated with her son, Christina Morgan, by phone. At time of my visit, [name] is resting quietly and is alert, denying pain or other complaint. She shares that she feels she may be discharged soon.    Patient is GIP appropriate for evaluation of vascular occlusion, including IV heparin infusion.   Vital Signs: 97.7/94/16   97/61   O2 99% on 2LPM via [place]   I&O: 210/480   Abnormal labs: BUN 30, Cre 1.35, Alb 3.0, GFRe 38, HGB 9.2, UA positive for hematuria, WBC, few bacteria; Culture pending.   IV/PRN Meds: Heparin infusion currently at 700 units/hr.     Assessment and Plan from note 4.29.25 Critical left lower extremity ischemia: CT angio bilateral aortofemoral runoff showed cclusion of the left common femoral artery at its bifurcation into the superficial and profunda femoral arteries with no opacification of of the superficial profunda, and popliteal arteries and lower extremity arteries.   -S/p left common femoral, profunda, superficial femoral, popliteal artery embolectomy -Continue IV heparin -Discontinue SCD   Paroxysmal A-fib with RVR: Mild RVR to 120s this morning.  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  Creatinine improving.  No flank pain or distress.  Discussed with patient's daughter, Christina Morgan about urology  consultation.  Christina Morgan would like to discuss with her brother and hospice before making decision.  She says she will be up here to meet with hospice in the next 1 to 2 hours. -Hold off urology consultation pending family decision.   Chronic HFpEF: Appears euvolemic on exam.  No cardiopulmonary symptoms or distress. -Continue holding Lasix . -Monitor fluid and respiratory status - Discontinue echocardiogram.  Doubt utility.   CKD-3B: Creatinine better than baseline.     Continue holding Lasix      History of endometrial cancer s/p TAH and BSO in 2013.  Discharge Planning: Ongoing   Family Contact: communicated with son, Christina Morgan by phone.   IDT: Updated   Goals of Care: DNR   [name] BSN, RN, [facility] hospital liaison [phone]

## 2024-04-11 DIAGNOSIS — N1832 Chronic kidney disease, stage 3b: Secondary | ICD-10-CM | POA: Diagnosis not present

## 2024-04-11 DIAGNOSIS — E785 Hyperlipidemia, unspecified: Secondary | ICD-10-CM

## 2024-04-11 DIAGNOSIS — I998 Other disorder of circulatory system: Secondary | ICD-10-CM

## 2024-04-11 DIAGNOSIS — I480 Paroxysmal atrial fibrillation: Secondary | ICD-10-CM

## 2024-04-11 DIAGNOSIS — I10 Essential (primary) hypertension: Secondary | ICD-10-CM | POA: Diagnosis not present

## 2024-04-11 DIAGNOSIS — I70222 Atherosclerosis of native arteries of extremities with rest pain, left leg: Secondary | ICD-10-CM

## 2024-04-11 LAB — RENAL FUNCTION PANEL
Albumin: 2.9 g/dL — ABNORMAL LOW (ref 3.5–5.0)
Anion gap: 11 (ref 5–15)
BUN: 35 mg/dL — ABNORMAL HIGH (ref 8–23)
CO2: 27 mmol/L (ref 22–32)
Calcium: 9 mg/dL (ref 8.9–10.3)
Chloride: 103 mmol/L (ref 98–111)
Creatinine, Ser: 1.44 mg/dL — ABNORMAL HIGH (ref 0.44–1.00)
GFR, Estimated: 35 mL/min — ABNORMAL LOW (ref >60)
Glucose, Bld: 107 mg/dL — ABNORMAL HIGH (ref 70–99)
Phosphorus: 3.3 mg/dL (ref 2.5–4.6)
Potassium: 4.1 mmol/L (ref 3.5–5.1)
Sodium: 141 mmol/L (ref 135–145)

## 2024-04-11 LAB — URINE CULTURE: Culture: >=100000 — AB

## 2024-04-11 LAB — CBC
HCT: 24.3 % — ABNORMAL LOW (ref 36.0–46.0)
Hemoglobin: 7.8 g/dL — ABNORMAL LOW (ref 12.0–15.0)
MCH: 30.8 pg (ref 26.0–34.0)
MCHC: 32.1 g/dL (ref 30.0–36.0)
MCV: 96 fL (ref 80.0–100.0)
Platelets: 235 10*3/uL (ref 150–400)
RBC: 2.53 MIL/uL — ABNORMAL LOW (ref 3.87–5.11)
RDW: 16.2 % — ABNORMAL HIGH (ref 11.5–15.5)
WBC: 8.1 10*3/uL (ref 4.0–10.5)
nRBC: 0 % (ref 0.0–0.2)

## 2024-04-11 LAB — HEPARIN LEVEL (UNFRACTIONATED)
Heparin Unfractionated: 0.49 [IU]/mL (ref 0.30–0.70)
Heparin Unfractionated: 0.45 [IU]/mL (ref 0.30–0.70)

## 2024-04-11 LAB — MAGNESIUM: Magnesium: 1.9 mg/dL (ref 1.7–2.4)

## 2024-04-11 MED ORDER — LINEZOLID 600 MG PO TABS
600.0000 mg | ORAL_TABLET | Freq: Two times a day (BID) | ORAL | Status: DC
  Administered 2024-04-11: 600 mg via ORAL
  Filled 2024-04-11 (×2): qty 1

## 2024-04-11 NOTE — Progress Notes (Signed)
 ANTICOAGULATION CONSULT NOTE  Pharmacy Consult for Heparin  Indication:  vascular occlusion  Allergies  Allergen Reactions   Penicillins Itching and Other (See Comments)    Dizziness to the patient of passing out- told by MD to "never take this again" Did it involve swelling of the face/tongue/throat, SOB, or low BP? No Did it involve sudden or severe rash/hives, skin peeling, or any reaction on the inside of your mouth or nose? No Did you need to seek medical attention at a hospital or doctor's office? No When did it last happen? "a long time ago"    If all above answers are "NO", may proceed with cephalosporin use.     Patient Measurements: Height: 5\' 4"  (162.6 cm) Weight: 66 kg (145 lb 8.1 oz) IBW/kg (Calculated) : 54.7 Heparin  Dosing Weight: 66 kg  Vital Signs: Temp: 98.3 F (36.8 C) (05/01 0256) Temp Source: Oral (05/01 0256) BP: 108/75 (05/01 0256) Pulse Rate: 88 (05/01 0256)  Labs: Recent Labs    04/09/24 0440 04/09/24 1153 04/10/24 0334 04/10/24 1519 04/11/24 0351  HGB 12.0  --  9.2* 8.5* 7.8*  HCT 36.8  --  29.3* 26.7* 24.3*  PLT 231  --  226 237 235  APTT  --   --   --  143*  --   LABPROT 16.4*  --   --   --   --   INR 1.3*  --   --   --   --   HEPARINUNFRC  --    < > >1.10* >1.10* 0.49  CREATININE 1.31*  --  1.35*  --  1.44*   < > = values in this interval not displayed.    Estimated Creatinine Clearance: 24.8 mL/min (A) (by C-G formula based on SCr of 1.44 mg/dL (H)).   Medical History: Past Medical History:  Diagnosis Date   Cancer [facility])    endometrial ca   Chronic diastolic CHF (congestive heart failure) (HCC)    Chronic edema    CKD (chronic kidney disease), stage III (HCC)    Dyslipidemia    Fracture of left humerus    History of cardiovascular stress test    Lexiscan  Myoview  (06/2014): No ischemia or scar, not gated, low risk   Hx of echocardiogram    Echo (05/02/14): EF 60% to 65%. No regional wall motion abnormalities. Mild AI, mildly  dilated aortic root (37 mm), trivial MR, trivial TR   Hyperlipidemia    Hypertension    Lipoma of skin    multiple   Osteopenia    PAF (paroxysmal atrial fibrillation) (HCC)    failed DCCV 05/2014 >> Flecainide  started >> DCCV 7/15 sucessful;  f/u ETT neg for pro-arrhythmia >> recurrent AF/AFL >> Flecainide  inc to 100 bid with repeat DCCV 9/15   Vitamin D  deficiency 09/25/2019     Assessment: 89 yo W on outpatient hospice with LLE arterial occlusion s/p embolectomy 4/29. No anticoagulation prior to admission (Apixaban  was removed when pt went on hospice). Pharmacy consulted for heparin .    Heparin  ran at 500 units/hr until 4am then increased to 1100 units/hr. Heparin  level >1.1 is supratherapeutic on 1100 units/hr.  No issues with infusion or bleeding per RN. Heparin  running in R arm, level drawn from L arm. Patient has blood tinged urine at baseline per RN.   5/1 AM update:  Heparin  level therapeutic after rate decrease  Goal of Therapy:  Heparin  level 0.3-0.7 units/ml Monitor platelets by anticoagulation protocol: Yes   Plan:  Cont heparin  at  550 units/hr Heparin  level in 8 hours  Silvestre Drum, PharmD, BCPS Clinical Pharmacist Phone: [phone]

## 2024-04-11 NOTE — Progress Notes (Signed)
 [facility] [phone] [facility] Liaison Note  Ms. Gitty Brase is a current hospice patient, with hospice diagnosis of chronic diastolic congestive heart failure. She was admitted 04/09/2024 with diagnosis of ischemic leg. [facility] was notified by patient's facility and daughter of patient's change in condition and an Estate manager/land agent made a visit to the patient prior to EMS being called. Per Dr. Tessie Fila, hospice physician, this is a related hospital admission.   Visited with patient, daughter Shyan and son Lyell Samuel, at bedside. Patient shares she has recently taken a pain pill for left leg/heel pain. Endorses being a bit altered. Keiryn shares that today they found a left heel wound, which has now been covered with foam dressing. Bedside RN was trying to locate a pillow to float the heel. This RN found a pillow tucked into a cubby while picking up backpack and elevated the left heel off of the bed. Brittina shares that patients blood counts continue to slowly drop and that patient will remain hospitalized to re-evaluate count tomorrow and if levels are decreased again, CT may be ordered to evaluate for source.  Patient remains GIP appropriate for continued monitoring of vascular occlusion, safe and successful transition of IV anticoagulants to DOAC and monitoring of blood counts for evaluation of possible source of bleeding.  V/S: 97.9 oral, 120/84, 107, 18, 100% on 2 lpm [place]  I/O: 238.2/100  Abnormal Labs: Glucose: 107 (H) BUN: 35 (H) Creatinine: 1.44 (H) Albumin : 2.9 (L) GFR, Estimated: 35 (L) RBC: 2.53 (L) Hemoglobin: 7.8 (L) HCT: 24.3 (L) RDW: 16.2 (H)  IV/PRN Meds: Heparin  100 units/ml (25000 units/250 ml) at 5.5 ml/hr (550 units/hr) continuous, hydromorphone  0.5 mg IV X 1, 1mg  X 3  Assessment and Plan from Dr. Bernardine Bridegroom note 5.1.2025 Critical left lower extremity ischemia: CT angio bilateral aortofemoral runoff showed cclusion of the left common femoral artery  at its bifurcation into the superficial and profunda femoral arteries with no opacification of of the superficial profunda, and popliteal arteries and lower extremity arteries.   -S/p left common femoral, profunda, superficial femoral, popliteal artery embolectomy -Continue IV heparin  and TED hose.  Will transition to DOAC once H&H stable -Monitor H&H.   Paroxysmal A-fib with RVR: RVR resolved.  Was off Eliquis  -Continue home Cardizem  CD and Toprol  -On IV heparin  for anticoagulation.  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  Cr improving.  No flank pain or distress.  -Urology advised against surgical intervention on anticoagulation and hospice, and Cr is stable.   Chronic HFpEF: Appears euvolemic on exam.  No cardiopulmonary symptoms or distress.  TTE without significant finding. -Continue holding Lasix . -Monitor fluid and respiratory status   CKD-3B: Cr better than baseline. Recent Labs (within last 365 days)              Recent Labs    11/17/23 0244 11/18/23 0227 11/18/23 1441 11/19/23 0256 11/20/23 0254 11/21/23 0241 04/08/24 2039 04/09/24 0440 04/10/24 0334 04/11/24 0351  BUN 50* 51* 54* 47* 44* 45* 34* 28* 30* 35*  CREATININE 1.91* 2.12* 2.33* 2.07* 2.00* 1.87* 1.44* 1.31* 1.35* 1.44*    - Continue holding Lasix    Anemia of renal disease: Hgb dropped about 3 g.  Baseline appears to be 11-12.  She had light pink urine in Foley catheter when Foley was in place.  No obvious bleeding. Recent Labs (within last 365 days)  Recent Labs    11/15/23 0257 11/16/23 0234 11/17/23 0244 11/18/23 0227 11/20/23 0254 04/08/24 2039 04/09/24 0440 04/10/24 0334 04/10/24 1519 04/11/24 0351  HGB 11.6* 11.7* 11.6* 12.0 11.7* 13.2 12.0 9.2* 8.5* 7.8*    -Recheck CBC in the morning   Dilated CBD: No GI symptoms.  LFT within normal.   Essential hypertension: BP slightly elevated -Home Cardizem  CD  and metoprolol    GERD -Continue Protonix    Hyperlipidemia -Continue Lipitor   History of endometrial cancer s/p TAH and BSO in 2013.   Advance care planning: DNR limited.  -[facility] hospice following  Discharge Planning: ongoing, likely return to facility with hospice  Family contact: as above  IDT: updated  Goals of Care: clear; DNR  Should patient need ambulance transport at discharge, please call [facility] as we contract with them for our active hospice patients.  Please call with any questions or concerns.  Thank you, Lestine Rathke, BSN, [facility] [phone]

## 2024-04-11 NOTE — Progress Notes (Addendum)
 PROGRESS NOTE  Christina Morgan:[id number] DOB: 06/18/1933   PCP: Cole Daubs, [facility]  Patient is from: [name].  Followed by [facility] hospice.   DOA: 04/08/2024 LOS: 3  Chief complaints Chief Complaint  Patient presents with   Leg Pain     Brief Narrative / Interim history: 89 year old F followed by hospice with PMH of HFpEF, CKD-3B, PAF off Eliquis , endometrial cancer  s/p TAH/BSO in 2013, HTN, HLD and GERD brought to ED by EMS due to left leg pain and found to have occlusion of left common femoral artery at its bifurcation into the superficial and profundofemoral arteries with no opacification of the superficial profunda and popliteal arteries and lower extremity.  Patient was off Eliquis  since she transition to hospice.  She is sedentary.  Vascular surgery consulted.  Patient underwent left common femoral, profunda, superficial femoral, popliteal artery embolectomy, and started on heparin .  CT angio also showed new marked left hydroureteronephrosis with soft tissue thickening at the left UVJ/bladder wall suspicious for occlusion secondary to infiltrative malignancy and dilated CBD.  Urology recommended against urological intervention since patient is on anticoagulation and enrolled with hospice.   Hemoglobin dropped from 12-7.8.  Reported surgical EBL 200 cc.  No overt bleeding.  Subjective: Seen and examined earlier this morning.  No major events overnight of this morning.  Hgb dropped from 8.5-7.8 over the last 24 hours.  No overt bleeding.  No signs of hematoma in left groin surgical site.  No reported GI bleed.  Patient continues to deny UTI symptoms  Objective: Vitals:   04/10/24 2340 04/11/24 0256 04/11/24 0835 04/11/24 1137  BP: 120/65 108/75 105/74 120/84  Pulse: 88 88 90 (!) 107  Resp: 20 20 18 18   Temp: 97.8 F (36.6 C) 98.3 F (36.8 C) (!) 97.4 F (36.3 C) 97.9 F (36.6 C)  TempSrc: Oral Oral Oral Oral  SpO2: 99% 100% 100% 100%   Weight:      Height:        Examination:  GENERAL: No apparent distress.  Nontoxic. HEENT: MMM.  Vision and hearing grossly intact.  NECK: Supple.  No apparent JVD.  Small bruise over anterior neck. RESP:  No IWOB.  Fair aeration bilaterally. CVS:  RRR. Heart sounds normal.  ABD/GI/GU: BS+. Abd soft, NTND.   MSK/EXT:  Moves extremities. No apparent deformity.  TED hose in place.  Left groin surgical site DCI. SKIN: As above. NEURO: Awake, alert and oriented appropriately.  No apparent focal neuro deficit. PSYCH: Calm. Normal affect.   Consultants:  Vascular surgery Urology  Procedures: 4/29- left common femoral, profunda, superficial femoral, popliteal artery embolectomy  Microbiology summarized: None  Assessment and plan: Critical left lower extremity ischemia: CT angio bilateral aortofemoral runoff showed cclusion of the left common femoral artery at its bifurcation into the superficial and profunda femoral arteries with no opacification of of the superficial profunda, and popliteal arteries and lower extremity arteries.   -S/p left common femoral, profunda, superficial femoral, popliteal artery embolectomy -Continue IV heparin  and TED hose.  Will transition to DOAC once H&H stable -Monitor H&H.   Paroxysmal A-fib with RVR: RVR resolved.  Was off Eliquis  -Continue home Cardizem  CD and Toprol  -On IV heparin  for anticoagulation.  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  Cr improving.  No flank pain or distress.  -Urology advised against surgical intervention on anticoagulation and hospice, and Cr is stable.  Chronic HFpEF: Appears  euvolemic on exam.  No cardiopulmonary symptoms or distress.  TTE without significant finding. -Continue holding Lasix . -Monitor fluid and respiratory status  CKD-3B: Cr better than baseline. Recent Labs    11/17/23 0244 11/18/23 0227 11/18/23 1441 11/19/23 0256  11/20/23 0254 11/21/23 0241 04/08/24 2039 04/09/24 0440 04/10/24 0334 04/11/24 0351  BUN 50* 51* 54* 47* 44* 45* 34* 28* 30* 35*  CREATININE 1.91* 2.12* 2.33* 2.07* 2.00* 1.87* 1.44* 1.31* 1.35* 1.44*  - Continue holding Lasix   Anemia of renal disease: Hgb dropped about 3 g.  Baseline appears to be 11-12.  She had light pink urine in Foley catheter when Foley was in place.  No obvious bleeding. Recent Labs    11/15/23 0257 11/16/23 0234 11/17/23 0244 11/18/23 0227 11/20/23 0254 04/08/24 2039 04/09/24 0440 04/10/24 0334 04/10/24 1519 04/11/24 0351  HGB 11.6* 11.7* 11.6* 12.0 11.7* 13.2 12.0 9.2* 8.5* 7.8*  -Recheck CBC in the morning  Dilated CBD: No GI symptoms.  LFT within normal.  Essential hypertension: BP slightly elevated -Home Cardizem  CD and metoprolol   GERD -Continue Protonix    Hyperlipidemia -Continue Lipitor  History of endometrial cancer s/p TAH and BSO in 2013.   Advance care planning: DNR limited.  -[facility] hospice following  Body mass index is 24.98 kg/m.    DVT prophylaxis:  Place TED hose Start: 04/09/24 0050 Place TED hose Start: 04/08/24 2250  Code Status: DNR-Limited Family Communication: Updated patient's daughter at bedside. Level of care: Progressive Status is: Inpatient Remains inpatient appropriate because: Critical limb ischemia and acute blood loss anemia   Final disposition: Likely back to SNF with hospice   55 minutes with more than 50% spent in reviewing records, counseling patient/family and coordinating care.   Sch Meds:  Scheduled Meds:  atorvastatin   10 mg Oral QHS   diltiazem   300 mg Oral Daily   linezolid   600 mg Oral Q12H   metoprolol  tartrate  25 mg Oral BID   pantoprazole   40 mg Oral Daily   potassium chloride   20-40 mEq Oral Once   sodium chloride  flush  3 mL Intravenous Q12H   Continuous Infusions:  heparin  550 Units/hr (04/10/24 1839)   PRN Meds:.acetaminophen  **OR** acetaminophen , HYDROmorphone   (DILAUDID ) injection, ondansetron  **OR** ondansetron  (ZOFRAN ) IV, oxyCODONE   Antimicrobials: Anti-infectives (From admission, onward)    Start     Dose/Rate Route Frequency Ordered Stop   04/11/24 0815  linezolid  (ZYVOX ) tablet 600 mg        600 mg Oral Every 12 hours 04/11/24 0715 04/16/24 0959        I have personally reviewed the following labs and images: CBC: Recent Labs  Lab 04/08/24 2039 04/09/24 0440 04/10/24 0334 04/10/24 1519 04/11/24 0351  WBC 10.1 10.8* 9.6 9.2 8.1  NEUTROABS 8.7*  --   --   --   --   HGB 13.2 12.0 9.2* 8.5* 7.8*  HCT 40.5 36.8 29.3* 26.7* 24.3*  MCV 94.6 93.4 95.8 96.0 96.0  PLT 248 231 226 237 235   BMP &GFR Recent Labs  Lab 04/08/24 2039 04/09/24 0440 04/10/24 0334 04/11/24 0351  NA 139 139 140 141  K 3.7 4.2 4.1 4.1  CL 102 99 102 103  CO2 23 25 29 27   GLUCOSE 175* 143* 111* 107*  BUN 34* 28* 30* 35*  CREATININE 1.44* 1.31* 1.35* 1.44*  CALCIUM  9.2 9.3 8.9 9.0  MG  --   --  1.9 1.9  PHOS  --   --  3.7 3.3   Estimated  Creatinine Clearance: 24.8 mL/min (A) (by C-G formula based on SCr of 1.44 mg/dL (H)). Liver & Pancreas: Recent Labs  Lab 04/09/24 0440 04/10/24 0334 04/11/24 0351  AST 26  --   --   ALT 11  --   --   ALKPHOS 55  --   --   BILITOT 0.6  --   --   PROT 6.1*  --   --   ALBUMIN  3.5 3.0* 2.9*   No results for input(s): "LIPASE", "AMYLASE" in the last 168 hours. No results for input(s): "AMMONIA" in the last 168 hours. Diabetic: No results for input(s): "HGBA1C" in the last 72 hours. No results for input(s): "GLUCAP" in the last 168 hours. Cardiac Enzymes: No results for input(s): "CKTOTAL", "CKMB", "CKMBINDEX", "TROPONINI" in the last 168 hours. No results for input(s): "PROBNP" in the last 8760 hours. Coagulation Profile: Recent Labs  Lab 04/09/24 0440  INR 1.3*   Thyroid  Function Tests: No results for input(s): "TSH", "T4TOTAL", "FREET4", "T3FREE", "THYROIDAB" in the last 72 hours. Lipid  Profile: No results for input(s): "CHOL", "HDL", "LDLCALC", "TRIG", "CHOLHDL", "LDLDIRECT" in the last 72 hours. Anemia Panel: No results for input(s): "VITAMINB12", "FOLATE", "FERRITIN", "TIBC", "IRON", "RETICCTPCT" in the last 72 hours. Urine analysis:    Component Value Date/Time   COLORURINE RED (A) 04/09/2024 2201   APPEARANCEUR TURBID (A) 04/09/2024 2201   LABSPEC  04/09/2024 2201    TEST NOT REPORTED DUE TO COLOR INTERFERENCE OF URINE PIGMENT   PHURINE  04/09/2024 2201    TEST NOT REPORTED DUE TO COLOR INTERFERENCE OF URINE PIGMENT   GLUCOSEU (A) 04/09/2024 2201    TEST NOT REPORTED DUE TO COLOR INTERFERENCE OF URINE PIGMENT   HGBUR (A) 04/09/2024 2201    TEST NOT REPORTED DUE TO COLOR INTERFERENCE OF URINE PIGMENT   BILIRUBINUR (A) 04/09/2024 2201    TEST NOT REPORTED DUE TO COLOR INTERFERENCE OF URINE PIGMENT   KETONESUR (A) 04/09/2024 2201    TEST NOT REPORTED DUE TO COLOR INTERFERENCE OF URINE PIGMENT   PROTEINUR (A) 04/09/2024 2201    TEST NOT REPORTED DUE TO COLOR INTERFERENCE OF URINE PIGMENT   UROBILINOGEN 0.2 06/19/2014 2131   NITRITE (A) 04/09/2024 2201    TEST NOT REPORTED DUE TO COLOR INTERFERENCE OF URINE PIGMENT   LEUKOCYTESUR (A) 04/09/2024 2201    TEST NOT REPORTED DUE TO COLOR INTERFERENCE OF URINE PIGMENT   Sepsis Labs: Invalid input(s): "PROCALCITONIN", "LACTICIDVEN"  Microbiology: Recent Results (from the past 240 hours)  Urine Culture     Status: Abnormal   Collection Time: 04/09/24 10:01 PM   Specimen: Urine, Catheterized  Result Value Ref Range Status   Specimen Description URINE, CATHETERIZED  Final   Special Requests   Final    NONE Performed at [facility], [address] [place], [place] [address]    Culture MULTIPLE SPECIES PRESENT, SUGGEST RECOLLECTION (A)  Final   Report Status 04/11/2024 FINAL  Final    Radiology Studies: ECHOCARDIOGRAM COMPLETE Result Date: 04/10/2024    ECHOCARDIOGRAM REPORT   Patient Name:   Christina Morgan Date of Exam: 04/10/2024 Medical Rec #:  [id number]           Height:       64.0 in Accession #:    [id number]          Weight:       145.5 lb Date of Birth:  1933-11-03  BSA:          1.709 m Patient Age:    89 years            BP:           111/72 mmHg Patient Gender: F                   HR:           89 bpm. Exam Location:  Inpatient Procedure: 2D Echo, Cardiac Doppler, Color Doppler and Intracardiac            Opacification Agent (Both Spectral and Color Flow Doppler were            utilized during procedure). Indications:    Atrial Fibrillation I48.91  History:        Patient has prior history of Echocardiogram examinations, most                 recent 11/09/2023. CHF, CKD, stage 3, Arrythmias:Atrial                 Fibrillation and Bradycardia, Signs/Symptoms:Shortness of                 Breath; Risk Factors:Hypertension and Dyslipidemia.  Sonographer:    Terrilee Few RCS Referring Phys: [phone] SUBRINA SUNDIL IMPRESSIONS  1. Left ventricular ejection fraction, by estimation, is 60 to 65%. The left ventricle has normal function. The left ventricle has no regional wall motion abnormalities. Left ventricular diastolic function could not be evaluated.  2. Right ventricular systolic function is normal. The right ventricular size is normal. There is normal pulmonary artery systolic pressure.  3. The mitral valve is normal in structure. Trivial mitral valve regurgitation. No evidence of mitral stenosis.  4. The non-coronary cusp is restricted. The aortic valve is tricuspid. Aortic valve regurgitation is mild. Aortic valve sclerosis/calcification is present, without any evidence of aortic stenosis. Aortic regurgitation PHT measures 639 msec. Aortic valve  area, by VTI measures 1.16 cm. Aortic valve mean gradient measures 7.0 mmHg. Aortic valve Vmax measures 1.97 m/s.  5. The inferior vena cava is normal in size with greater than 50% respiratory variability, suggesting right atrial pressure of 3  mmHg. FINDINGS  Left Ventricle: Left ventricular ejection fraction, by estimation, is 60 to 65%. The left ventricle has normal function. The left ventricle has no regional wall motion abnormalities. Definity  contrast agent was given IV to delineate the left ventricular  endocardial borders. The left ventricular internal cavity size was normal in size. There is no left ventricular hypertrophy. Left ventricular diastolic function could not be evaluated due to atrial fibrillation. Left ventricular diastolic function could  not be evaluated. Right Ventricle: The right ventricular size is normal. No increase in right ventricular wall thickness. Right ventricular systolic function is normal. There is normal pulmonary artery systolic pressure. The tricuspid regurgitant velocity is 1.91 m/s, and  with an assumed right atrial pressure of 3 mmHg, the estimated right ventricular systolic pressure is 17.6 mmHg. Left Atrium: Left atrial size was normal in size. Right Atrium: Right atrial size was normal in size. Pericardium: There is no evidence of pericardial effusion. Mitral Valve: The mitral valve is normal in structure. Trivial mitral valve regurgitation. No evidence of mitral valve stenosis. Tricuspid Valve: The tricuspid valve is normal in structure. Tricuspid valve regurgitation is trivial. No evidence of tricuspid stenosis. Aortic Valve: The non-coronary cusp is restricted. The aortic valve is tricuspid. Aortic valve regurgitation is mild. Aortic regurgitation PHT  measures 639 msec. Aortic valve sclerosis/calcification is present, without any evidence of aortic stenosis. Aortic valve mean gradient measures 7.0 mmHg. Aortic valve peak gradient measures 15.5 mmHg. Aortic valve area, by VTI measures 1.16 cm. Pulmonic Valve: The pulmonic valve was normal in structure. Pulmonic valve regurgitation is mild. No evidence of pulmonic stenosis. Aorta: The aortic root is normal in size and structure. Venous: The inferior vena cava  is normal in size with greater than 50% respiratory variability, suggesting right atrial pressure of 3 mmHg. IAS/Shunts: No atrial level shunt detected by color flow Doppler.  LEFT VENTRICLE PLAX 2D LVIDd:         3.70 cm   Diastology LVIDs:         2.20 cm   LV e' medial:    9.57 cm/s LV PW:         1.10 cm   LV E/e' medial:  9.8 LV IVS:        1.00 cm   LV e' lateral:   9.68 cm/s LVOT diam:     2.00 cm   LV E/e' lateral: 9.7 LV SV:         41 LV SV Index:   24 LVOT Area:     3.14 cm  RIGHT VENTRICLE            IVC RV S prime:     9.46 cm/s  IVC diam: 1.10 cm TAPSE (M-mode): 1.3 cm LEFT ATRIUM             Index        RIGHT ATRIUM           Index LA diam:        3.20 cm 1.87 cm/m   RA Area:     10.90 cm LA Vol (A2C):   58.3 ml 34.11 ml/m  RA Volume:   18.20 ml  10.65 ml/m LA Vol (A4C):   83.0 ml 48.57 ml/m LA Biplane Vol: 71.5 ml 41.84 ml/m  AORTIC VALVE                     PULMONIC VALVE AV Area (Vmax):    1.17 cm      PR End Diast Vel: 4.08 msec AV Area (Vmean):   1.18 cm AV Area (VTI):     1.16 cm AV Vmax:           197.00 cm/s AV Vmean:          122.000 cm/s AV VTI:            0.356 m AV Peak Grad:      15.5 mmHg AV Mean Grad:      7.0 mmHg LVOT Vmax:         73.40 cm/s LVOT Vmean:        46.000 cm/s LVOT VTI:          0.132 m LVOT/AV VTI ratio: 0.37 AI PHT:            639 msec  AORTA Ao Root diam: 3.70 cm Ao Asc diam:  3.80 cm MITRAL VALVE               TRICUSPID VALVE MV Area (PHT): 4.86 cm    TR Peak grad:   14.6 mmHg MV Decel Time: 156 msec    TR Vmax:        191.00 cm/s MV E velocity: 94.00 cm/s  SHUNTS                            Systemic VTI:  0.13 m                            Systemic Diam: 2.00 cm Maudine Sos MD Electronically signed by Maudine Sos MD Signature Date/Time: 04/10/2024/6:42:15 PM    Final        July Nickson T. Deangela Randleman [facility]  If 7PM-7AM, please contact night-coverage [email or web address] 04/11/2024, 12:40 PM

## 2024-04-11 NOTE — Plan of Care (Signed)

## 2024-04-11 NOTE — Progress Notes (Addendum)
  Progress Note    04/11/2024 6:30 AM 3 Days Post-Op  Subjective:  sleeping, wakes easily  afebrile  Vitals:   04/10/24 2340 04/11/24 0256  BP: 120/65 108/75  Pulse: 88 88  Resp: 20 20  Temp: 97.8 F (36.6 C) 98.3 F (36.8 C)  SpO2: 99% 100%    Physical Exam: General:  no distress Cardiac:  irregular Lungs:  non labored Incisions:  left groin clean without hematoma Extremities:  brisk left PT doppler signal.  Left foot is warm.     CBC    Component Value Date/Time   WBC 8.1 04/11/2024 0351   RBC 2.53 (L) 04/11/2024 0351   HGB 7.8 (L) 04/11/2024 0351   HGB 11.6 09/19/2023 1635   HCT 24.3 (L) 04/11/2024 0351   HCT 36.9 09/19/2023 1635   PLT 235 04/11/2024 0351   PLT 293 09/19/2023 1635   MCV 96.0 04/11/2024 0351   MCV 89 09/19/2023 1635   MCH 30.8 04/11/2024 0351   MCHC 32.1 04/11/2024 0351   RDW 16.2 (H) 04/11/2024 0351   RDW 14.4 09/19/2023 1635   LYMPHSABS 0.7 04/08/2024 2039   LYMPHSABS 0.6 (L) 09/05/2017 1316   MONOABS 0.5 04/08/2024 2039   EOSABS 0.0 04/08/2024 2039   EOSABS 0.0 09/05/2017 1316   BASOSABS 0.0 04/08/2024 2039   BASOSABS 0.0 09/05/2017 1316    BMET    Component Value Date/Time   NA 141 04/11/2024 0351   NA 144 09/19/2023 1635   K 4.1 04/11/2024 0351   CL 103 04/11/2024 0351   CO2 27 04/11/2024 0351   GLUCOSE 107 (H) 04/11/2024 0351   BUN 35 (H) 04/11/2024 0351   BUN 22 09/19/2023 1635   CREATININE 1.44 (H) 04/11/2024 0351   CREATININE 0.82 03/21/2016 0915   CALCIUM  9.0 04/11/2024 0351   CALCIUM  8.0 (L) 09/26/2019 0654   GFRNONAA 35 (L) 04/11/2024 0351   GFRAA 31 (L) 10/13/2020 1420    INR    Component Value Date/Time   INR 1.3 (H) 04/09/2024 0440     Intake/Output Summary (Last 24 hours) at 04/11/2024 0630 Last data filed at 04/11/2024 9811 Gross per 24 hour  Intake 238.2 ml  Output 100 ml  Net 138.2 ml      Assessment/Plan:  89 y.o. female is s/p:  Left common femoral, profunda, superficial femoral, popliteal  artery embolectomy on 04/09/2024 by Dr. Rosalva Comber for acute LLE ischemia   3 Days Post-Op   -pt doing well from vascular standpoint with brisk left PT doppler flow.  Incision looks good. -DVT prophylaxis:  heparin  gtt-transition to DOAC prior to discharge back to hospice facility -f/u with [facility] in 2-3 weeks for incision check. Our office will arrange appt.   Maryanna Smart, PA-C [facility] [phone] 04/11/2024 6:30 AM   VASCULAR STAFF ADDENDUM: I have independently interviewed and examined the patient. I agree with the above.  Patient with hematocrit drop.  I would recheck.  This is likely from surgery.  Incision site soft.  No concerns for ongoing bleeding from a vascular surgery perspective.  If this continues, recommend checking for GI bleed but at this time would manage conservatively.  Continue current medication regimen. Okay for discharge from vascular surgery perspective once hematocrit stabilizes.  Kayla Part MD [facility] Phone Number: [phone] 04/11/2024 8:08 AM

## 2024-04-11 NOTE — Progress Notes (Signed)
 ANTICOAGULATION CONSULT NOTE  Pharmacy Consult for Heparin  Indication:  vascular occlusion  s/p embolectomy  Allergies  Allergen Reactions   Penicillins Itching and Other (See Comments)    Dizziness to the patient of passing out- told by MD to "never take this again" Did it involve swelling of the face/tongue/throat, SOB, or low BP? No Did it involve sudden or severe rash/hives, skin peeling, or any reaction on the inside of your mouth or nose? No Did you need to seek medical attention at a hospital or doctor's office? No When did it last happen? "a long time ago"    If all above answers are "NO", may proceed with cephalosporin use.     Patient Measurements: Height: 5\' 4"  (162.6 cm) Weight: 66 kg (145 lb 8.1 oz) IBW/kg (Calculated) : 54.7 Heparin  Dosing Weight: 66 kg  Vital Signs: Temp: 97.9 F (36.6 C) (05/01 1137) Temp Source: Oral (05/01 1137) BP: 120/84 (05/01 1137) Pulse Rate: 107 (05/01 1137)  Labs: Recent Labs    04/09/24 0440 04/09/24 1153 04/10/24 0334 04/10/24 1519 04/11/24 0351 04/11/24 1257  HGB 12.0  --  9.2* 8.5* 7.8*  --   HCT 36.8  --  29.3* 26.7* 24.3*  --   PLT 231  --  226 237 235  --   APTT  --   --   --  143*  --   --   LABPROT 16.4*  --   --   --   --   --   INR 1.3*  --   --   --   --   --   HEPARINUNFRC  --    < > >1.10* >1.10* 0.49 0.45  CREATININE 1.31*  --  1.35*  --  1.44*  --    < > = values in this interval not displayed.    Estimated Creatinine Clearance: 24.8 mL/min (A) (by C-G formula based on SCr of 1.44 mg/dL (H)).   Medical History: Past Medical History:  Diagnosis Date   Cancer [facility])    endometrial ca   Chronic diastolic CHF (congestive heart failure) (HCC)    Chronic edema    CKD (chronic kidney disease), stage III (HCC)    Dyslipidemia    Fracture of left humerus    History of cardiovascular stress test    Lexiscan  Myoview  (06/2014): No ischemia or scar, not gated, low risk   Hx of echocardiogram    Echo  (05/02/14): EF 60% to 65%. No regional wall motion abnormalities. Mild AI, mildly dilated aortic root (37 mm), trivial MR, trivial TR   Hyperlipidemia    Hypertension    Lipoma of skin    multiple   Osteopenia    PAF (paroxysmal atrial fibrillation) (HCC)    failed DCCV 05/2014 >> Flecainide  started >> DCCV 7/15 sucessful;  f/u ETT neg for pro-arrhythmia >> recurrent AF/AFL >> Flecainide  inc to 100 bid with repeat DCCV 9/15   Vitamin D  deficiency 09/25/2019     Assessment: 89 yo F on outpatient hospice with LLE arterial occlusion s/p embolectomy 4/29. No anticoagulation prior to admission (Apixaban  was removed when pt went on hospice). Pharmacy consulted for heparin .    Heparin  remains therapeutic on 550 units/hr.  Noted Hgb drop since procedure.  No overt bleeding noted.  Will monitor.  Goal of Therapy:  Heparin  level 0.3-0.7 units/ml Monitor platelets by anticoagulation protocol: Yes   Plan:  Continue heparin  at 550 units/hr Daily heparin  level and CBC while on heparin    Jullie Oiler  Calla Wedekind, Pharm.D., BCPS Clinical Pharmacist  **Pharmacist phone directory can be found on [email or web address] listed under [facility].  04/11/2024 3:28 PM

## 2024-04-11 NOTE — Plan of Care (Signed)

## 2024-04-12 ENCOUNTER — Other Ambulatory Visit (HOSPITAL_COMMUNITY): Payer: Self-pay

## 2024-04-12 DIAGNOSIS — I5032 Chronic diastolic (congestive) heart failure: Secondary | ICD-10-CM

## 2024-04-12 DIAGNOSIS — K219 Gastro-esophageal reflux disease without esophagitis: Secondary | ICD-10-CM

## 2024-04-12 DIAGNOSIS — L043 Acute lymphadenitis of lower limb: Secondary | ICD-10-CM

## 2024-04-12 DIAGNOSIS — N184 Chronic kidney disease, stage 4 (severe): Secondary | ICD-10-CM

## 2024-04-12 DIAGNOSIS — N1832 Chronic kidney disease, stage 3b: Secondary | ICD-10-CM | POA: Diagnosis not present

## 2024-04-12 DIAGNOSIS — N1330 Unspecified hydronephrosis: Secondary | ICD-10-CM

## 2024-04-12 DIAGNOSIS — I480 Paroxysmal atrial fibrillation: Secondary | ICD-10-CM | POA: Diagnosis not present

## 2024-04-12 DIAGNOSIS — I998 Other disorder of circulatory system: Secondary | ICD-10-CM | POA: Diagnosis not present

## 2024-04-12 LAB — CBC
HCT: 24.8 % — ABNORMAL LOW (ref 36.0–46.0)
Hemoglobin: 7.9 g/dL — ABNORMAL LOW (ref 12.0–15.0)
MCH: 31 pg (ref 26.0–34.0)
MCHC: 31.9 g/dL (ref 30.0–36.0)
MCV: 97.3 fL (ref 80.0–100.0)
Platelets: 240 10*3/uL (ref 150–400)
RBC: 2.55 MIL/uL — ABNORMAL LOW (ref 3.87–5.11)
RDW: 15.9 % — ABNORMAL HIGH (ref 11.5–15.5)
WBC: 8.3 10*3/uL (ref 4.0–10.5)
nRBC: 0 % (ref 0.0–0.2)

## 2024-04-12 LAB — HEPARIN LEVEL (UNFRACTIONATED): Heparin Unfractionated: 0.28 [IU]/mL — ABNORMAL LOW (ref 0.30–0.70)

## 2024-04-12 MED ORDER — APIXABAN 5 MG PO TABS
5.0000 mg | ORAL_TABLET | Freq: Two times a day (BID) | ORAL | Status: DC
  Administered 2024-04-12: 5 mg via ORAL
  Filled 2024-04-12: qty 1

## 2024-04-12 MED ORDER — ATORVASTATIN CALCIUM 10 MG PO TABS: 10.0000 mg | ORAL_TABLET | Freq: Every day | ORAL | Status: AC

## 2024-04-12 MED ORDER — APIXABAN 5 MG PO TABS
5.0000 mg | ORAL_TABLET | Freq: Two times a day (BID) | ORAL | 0 refills | Status: AC
  Filled 2024-04-12: qty 60, 30d supply, fill #0

## 2024-04-12 MED ORDER — APIXABAN 5 MG PO TABS: 5.0000 mg | ORAL_TABLET | Freq: Two times a day (BID) | ORAL | 2 refills | Status: DC

## 2024-04-12 NOTE — Progress Notes (Signed)
  Progress Note    04/12/2024 7:37 AM 4 Days Post-Op  Subjective:  no complaints   Vitals:   04/11/24 2338 04/12/24 0412  BP: 105/80 (!) 109/58  Pulse: 86 100  Resp: 19 15  Temp: 98.1 F (36.7 C) 98.3 F (36.8 C)  SpO2: 100% 100%   Physical Exam: Lungs:  non labored Incisions:  L groin fullness but no firm hematoma Extremities:  brisk L PT by doppler Neurologic: A&O  CBC    Component Value Date/Time   WBC 8.3 04/12/2024 0412   RBC 2.55 (L) 04/12/2024 0412   HGB 7.9 (L) 04/12/2024 0412   HGB 11.6 09/19/2023 1635   HCT 24.8 (L) 04/12/2024 0412   HCT 36.9 09/19/2023 1635   PLT 240 04/12/2024 0412   PLT 293 09/19/2023 1635   MCV 97.3 04/12/2024 0412   MCV 89 09/19/2023 1635   MCH 31.0 04/12/2024 0412   MCHC 31.9 04/12/2024 0412   RDW 15.9 (H) 04/12/2024 0412   RDW 14.4 09/19/2023 1635   LYMPHSABS 0.7 04/08/2024 2039   LYMPHSABS 0.6 (L) 09/05/2017 1316   MONOABS 0.5 04/08/2024 2039   EOSABS 0.0 04/08/2024 2039   EOSABS 0.0 09/05/2017 1316   BASOSABS 0.0 04/08/2024 2039   BASOSABS 0.0 09/05/2017 1316    BMET    Component Value Date/Time   NA 141 04/11/2024 0351   NA 144 09/19/2023 1635   K 4.1 04/11/2024 0351   CL 103 04/11/2024 0351   CO2 27 04/11/2024 0351   GLUCOSE 107 (H) 04/11/2024 0351   BUN 35 (H) 04/11/2024 0351   BUN 22 09/19/2023 1635   CREATININE 1.44 (H) 04/11/2024 0351   CREATININE 0.82 03/21/2016 0915   CALCIUM  9.0 04/11/2024 0351   CALCIUM  8.0 (L) 09/26/2019 0654   GFRNONAA 35 (L) 04/11/2024 0351   GFRAA 31 (L) 10/13/2020 1420    INR    Component Value Date/Time   INR 1.3 (H) 04/09/2024 0440    No intake or output data in the 24 hours ending 04/12/24 0737   Assessment/Plan:  89 y.o. female is s/p Left common femoral, profunda, superficial femoral, popliteal artery embolectomy on 04/09/2024 by Dr. Rosalva Comber for acute LLE ischemia  4 Days Post-Op   L foot warm with brisk PT doppler signal L groin incision bruising and fullness but  no firm hematoma H&H stable Ok for discharge from vascular standpoint when cleared by medicine   Cordie Deters, PA-C [facility] [phone] 04/12/2024 7:37 AM

## 2024-04-12 NOTE — Discharge Summary (Signed)
 Physician Discharge Summary  Christina Morgan ZOX:[id number] DOB: 10/19/1933 DOA: 04/08/2024  PCP: Cole Daubs, [facility]  Admit date: 04/08/2024 Discharge date: 04/12/24  Admitted From: ALF Disposition:  AlF Recommendations for Outpatient Follow-up:  Hospice follow-up at ALF Check CMP and CBC in 1 week Outpatient follow-up with vascular surgery as below Please follow up on the following pending results: None   Discharge Condition: Stable CODE STATUS: DNR  Follow-up Information     [facility] Follow up in 3 week(s).   Specialty: Vascular Surgery Why: Office will call you to arrange your appt (sent). Contact information: [address] [place]  [id number] [phone]                Hospital course 89 year old F followed by hospice with PMH of HFpEF, CKD-3B, PAF off Eliquis , endometrial cancer  s/p TAH/BSO in 2013, HTN, HLD and GERD brought to ED by EMS due to left leg pain and found to have occlusion of left common femoral artery at its bifurcation into the superficial and profundofemoral arteries with no opacification of the superficial profunda and popliteal arteries and lower extremity.  Patient was off Eliquis  since she transition to hospice.  She is sedentary.  Vascular surgery consulted.  Patient underwent left common femoral, profunda, superficial femoral, popliteal artery embolectomy, and started on heparin .   CT angio also showed new marked left hydroureteronephrosis with soft tissue thickening at the left UVJ/bladder wall suspicious for occlusion secondary to infiltrative malignancy and dilated CBD.  Urology recommended against urological intervention since patient is on anticoagulation and enrolled with hospice.  Renal function remained stable.   Hemoglobin dropped from 12-7.8 and remained stable afterward..  Reported surgical EBL 200 cc.  No overt bleeding.  Transitioned  to p.o. Eliquis  on the day of discharge.  Check CBC in 1 week  See individual problem list below for more.   Problems addressed during this hospitalization Critical left lower extremity ischemia: CT angio bilateral aortofemoral runoff showed cclusion of the left common femoral artery at its bifurcation into the superficial and profunda femoral arteries with no opacification of of the superficial profunda, and popliteal arteries and lower extremity arteries.   -S/p left common femoral, profunda, superficial femoral, popliteal artery embolectomy - Transition to p.o. Eliquis  5 mg twice daily.  Continue TED hose - Check CBC in 1 week -Outpatient follow-up with vascular surgery   Paroxysmal A-fib with RVR: RVR resolved.  Was off Eliquis  -Continue home Cardizem  CD and Toprol  - Anticoagulation as above   Left hydronephrosis with possible infiltrative malignancy of left UVJ.  Cr improving.  No flank pain or distress.  -Urology advised against surgical intervention on anticoagulation and hospice, and Cr is stable. -Check renal function in 1 week   Chronic HFpEF: Appears euvolemic on exam.  No cardiopulmonary symptoms or distress.  TTE without significant finding. - Continue home Lasix    CKD-3B: Cr better than baseline. Recent Labs    11/17/23 0244 11/18/23 0227 11/18/23 1441 11/19/23 0256 11/20/23 0254 11/21/23 0241 04/08/24 2039 04/09/24 0440 04/10/24 0334 04/11/24 0351  BUN 50* 51* 54* 47* 44* 45* 34* 28* 30* 35*  CREATININE 1.91* 2.12* 2.33* 2.07* 2.00* 1.87* 1.44* 1.31* 1.35* 1.44*  -Check renal function in 1 to 2 weeks    Anemia of renal disease: Hgb dropped about 3 g.  Baseline appears to be 11-12.  She had light pink urine in Foley  catheter when Foley was in place.  No obvious bleeding. Recent Labs    11/16/23 0234 11/17/23 0244 11/18/23 0227 11/20/23 0254 04/08/24 2039 04/09/24 0440 04/10/24 0334 04/10/24 1519 04/11/24 0351 04/12/24 0412  HGB 11.7* 11.6* 12.0 11.7*  13.2 12.0 9.2* 8.5* 7.8* 7.9*  - Recheck CBC in 1 week    Dilated CBD: No GI symptoms.  LFT within normal.   Essential hypertension: Normotensive for most part - Continue Home Cardizem  CD and metoprolol    GERD -Continue Protonix    Hyperlipidemia - Continue home Lipitor.   History of endometrial cancer s/p TAH and BSO in 2013.   Advance care planning: DNR limited.  -[facility] hospice following   Pressure skin injury: Present on admission Pressure Injury 04/11/24 Ankle Left;Posterior Deep Tissue Pressure Injury - Purple or maroon localized area of discolored intact skin or blood-filled blister due to damage of underlying soft tissue from pressure and/or shear. DTI on back of left heel (Active)  04/11/24 1539  Location: Ankle  Location Orientation: Left;Posterior  Staging: Deep Tissue Pressure Injury - Purple or maroon localized area of discolored intact skin or blood-filled blister due to damage of underlying soft tissue from pressure and/or shear.  Wound Description (Comments): DTI on back of left heel  Present on Admission: Yes  Dressing Type Foam - Lift dressing to assess site every shift 04/11/24 1500    Time spent 35 minutes  Vital signs Vitals:   04/11/24 2152 04/11/24 2338 04/12/24 0412 04/12/24 0744  BP: 113/71 105/80 (!) 109/58 (!) 108/54  Pulse: 93 86 100 (!) 105  Temp:  98.1 F (36.7 C) 98.3 F (36.8 C) 98.4 F (36.9 C)  Resp:  19 15 15   Height:      Weight:      SpO2:  100% 100% 100%  TempSrc:  Oral Oral Oral  BMI (Calculated):         Discharge exam  GENERAL: No apparent distress.  Nontoxic. HEENT: MMM.  Vision and hearing grossly intact.  NECK: Supple.  No apparent JVD.  Small bruise over anterior neck. RESP:  No IWOB.  Fair aeration bilaterally. CVS: Irregular rhythm.  HR ranges from 90s to 100.  Heart sounds normal.  ABD/GI/GU: BS+. Abd soft, NTND.   MSK/EXT:  Moves extremities. No apparent deformity.  TED hose in place.  Left groin surgical  site DCI. SKIN: As above. NEURO: Awake, alert and oriented appropriately.  No apparent focal neuro deficit. PSYCH: Calm. Normal affect  Discharge Instructions Discharge Instructions     Diet general   Complete by: As directed    Discharge wound care:   Complete by: As directed    Pressure Injury 04/11/24 Ankle Left;Posterior Deep Tissue Pressure Injury - Purple or maroon localized area of discolored intact skin or blood-filled blister due to damage of underlying soft tissue from pressure and/or shear. Recommend offloading and monitoring   Increase activity slowly   Complete by: As directed       Allergies as of 04/12/2024       Reactions   Penicillins Itching, Other (See Comments)   Dizziness to the patient of passing out- told by MD to "never take this again" Did it involve swelling of the face/tongue/throat, SOB, or low BP? No Did it involve sudden or severe rash/hives, skin peeling, or any reaction on the inside of your mouth or nose? No Did you need to seek medical attention at a hospital or doctor's office? No When did it last happen? "a long time  ago"    If all above answers are "NO", may proceed with cephalosporin use.        Medication List     TAKE these medications    Acetaminophen  8 Hour 650 MG CR tablet Generic drug: acetaminophen  Take 650 mg by mouth in the morning and at bedtime.   apixaban  5 MG Tabs tablet Commonly known as: ELIQUIS  Take 1 tablet (5 mg total) by mouth 2 (two) times daily. What changed:  medication strength how much to take   Automatic Data Barrier 12 % Crea Generic drug: Zinc Oxide Apply 1 Application topically 3 (three) times daily as needed (after brief changes).   Zinc Oxide 22 % Crea Apply 1 Application topically daily. Apply to sacrum   DAILY VITE PO Take 1 tablet by mouth daily.   diltiazem  300 MG 24 hr capsule Commonly known as: CARDIZEM  CD Take 1 capsule (300 mg total) by mouth daily.   ENSURE PLUS PO Take 1  Bottle by mouth daily.   furosemide  40 MG tablet Commonly known as: Lasix  Take 1 tablet (40 mg total) by mouth every other day.   loperamide 2 MG tablet Commonly known as: IMODIUM A-D Take 2 mg by mouth every 6 (six) hours as needed for diarrhea or loose stools. Do not exceed 8mg  in 24 hours.   metoprolol  tartrate 25 MG tablet Commonly known as: LOPRESSOR  Take 1 tablet (25 mg total) by mouth 2 (two) times daily.   omeprazole 20 MG capsule Commonly known as: PRILOSEC Take 20 mg by mouth daily.   ondansetron  4 MG disintegrating tablet Commonly known as: ZOFRAN -ODT Take 4 mg by mouth every 4 (four) hours as needed for nausea or vomiting.   OXYGEN Inhale 1 L into the lungs continuous.   potassium chloride  SA 20 MEQ tablet Commonly known as: KLOR-CON  M Take 1 tablet (20 mEq total) by mouth every other day. With Lasix                Discharge Care Instructions  (From admission, onward)           Start     Ordered   04/12/24 0000  Discharge wound care:       Comments: Pressure Injury 04/11/24 Ankle Left;Posterior Deep Tissue Pressure Injury - Purple or maroon localized area of discolored intact skin or blood-filled blister due to damage of underlying soft tissue from pressure and/or shear. Recommend offloading and monitoring   04/12/24 0945            Consultations: Vascular surgery  Procedures/Studies: 4/29- left common femoral, profunda, superficial femoral, popliteal artery embolectomy    ECHOCARDIOGRAM COMPLETE Result Date: 04/10/2024    ECHOCARDIOGRAM REPORT   Patient Name:   SYLVANAS GISLASON Date of Exam: 04/10/2024 Medical Rec #:  [id number]           Height:       64.0 in Accession #:    [id number]          Weight:       145.5 lb Date of Birth:  [birth year]            BSA:          1.709 m Patient Age:    89 years            BP:           111/72 mmHg Patient Gender: F  HR:           89 bpm. Exam Location:  Inpatient Procedure: 2D Echo,  Cardiac Doppler, Color Doppler and Intracardiac            Opacification Agent (Both Spectral and Color Flow Doppler were            utilized during procedure). Indications:    Atrial Fibrillation I48.91  History:        Patient has prior history of Echocardiogram examinations, most                 recent 11/09/2023. CHF, CKD, stage 3, Arrythmias:Atrial                 Fibrillation and Bradycardia, Signs/Symptoms:Shortness of                 Breath; Risk Factors:Hypertension and Dyslipidemia.  Sonographer:    Terrilee Few RCS Referring Phys: [phone] SUBRINA SUNDIL IMPRESSIONS  1. Left ventricular ejection fraction, by estimation, is 60 to 65%. The left ventricle has normal function. The left ventricle has no regional wall motion abnormalities. Left ventricular diastolic function could not be evaluated.  2. Right ventricular systolic function is normal. The right ventricular size is normal. There is normal pulmonary artery systolic pressure.  3. The mitral valve is normal in structure. Trivial mitral valve regurgitation. No evidence of mitral stenosis.  4. The non-coronary cusp is restricted. The aortic valve is tricuspid. Aortic valve regurgitation is mild. Aortic valve sclerosis/calcification is present, without any evidence of aortic stenosis. Aortic regurgitation PHT measures 639 msec. Aortic valve  area, by VTI measures 1.16 cm. Aortic valve mean gradient measures 7.0 mmHg. Aortic valve Vmax measures 1.97 m/s.  5. The inferior vena cava is normal in size with greater than 50% respiratory variability, suggesting right atrial pressure of 3 mmHg. FINDINGS  Left Ventricle: Left ventricular ejection fraction, by estimation, is 60 to 65%. The left ventricle has normal function. The left ventricle has no regional wall motion abnormalities. Definity  contrast agent was given IV to delineate the left ventricular  endocardial borders. The left ventricular internal cavity size was normal in size. There is no left  ventricular hypertrophy. Left ventricular diastolic function could not be evaluated due to atrial fibrillation. Left ventricular diastolic function could  not be evaluated. Right Ventricle: The right ventricular size is normal. No increase in right ventricular wall thickness. Right ventricular systolic function is normal. There is normal pulmonary artery systolic pressure. The tricuspid regurgitant velocity is 1.91 m/s, and  with an assumed right atrial pressure of 3 mmHg, the estimated right ventricular systolic pressure is 17.6 mmHg. Left Atrium: Left atrial size was normal in size. Right Atrium: Right atrial size was normal in size. Pericardium: There is no evidence of pericardial effusion. Mitral Valve: The mitral valve is normal in structure. Trivial mitral valve regurgitation. No evidence of mitral valve stenosis. Tricuspid Valve: The tricuspid valve is normal in structure. Tricuspid valve regurgitation is trivial. No evidence of tricuspid stenosis. Aortic Valve: The non-coronary cusp is restricted. The aortic valve is tricuspid. Aortic valve regurgitation is mild. Aortic regurgitation PHT measures 639 msec. Aortic valve sclerosis/calcification is present, without any evidence of aortic stenosis. Aortic valve mean gradient measures 7.0 mmHg. Aortic valve peak gradient measures 15.5 mmHg. Aortic valve area, by VTI measures 1.16 cm. Pulmonic Valve: The pulmonic valve was normal in structure. Pulmonic valve regurgitation is mild. No evidence of pulmonic stenosis. Aorta: The aortic root is normal in size  and structure. Venous: The inferior vena cava is normal in size with greater than 50% respiratory variability, suggesting right atrial pressure of 3 mmHg. IAS/Shunts: No atrial level shunt detected by color flow Doppler.  LEFT VENTRICLE PLAX 2D LVIDd:         3.70 cm   Diastology LVIDs:         2.20 cm   LV e' medial:    9.57 cm/s LV PW:         1.10 cm   LV E/e' medial:  9.8 LV IVS:        1.00 cm   LV e'  lateral:   9.68 cm/s LVOT diam:     2.00 cm   LV E/e' lateral: 9.7 LV SV:         41 LV SV Index:   24 LVOT Area:     3.14 cm  RIGHT VENTRICLE            IVC RV S prime:     9.46 cm/s  IVC diam: 1.10 cm TAPSE (M-mode): 1.3 cm LEFT ATRIUM             Index        RIGHT ATRIUM           Index LA diam:        3.20 cm 1.87 cm/m   RA Area:     10.90 cm LA Vol (A2C):   58.3 ml 34.11 ml/m  RA Volume:   18.20 ml  10.65 ml/m LA Vol (A4C):   83.0 ml 48.57 ml/m LA Biplane Vol: 71.5 ml 41.84 ml/m  AORTIC VALVE                     PULMONIC VALVE AV Area (Vmax):    1.17 cm      PR End Diast Vel: 4.08 msec AV Area (Vmean):   1.18 cm AV Area (VTI):     1.16 cm AV Vmax:           197.00 cm/s AV Vmean:          122.000 cm/s AV VTI:            0.356 m AV Peak Grad:      15.5 mmHg AV Mean Grad:      7.0 mmHg LVOT Vmax:         73.40 cm/s LVOT Vmean:        46.000 cm/s LVOT VTI:          0.132 m LVOT/AV VTI ratio: 0.37 AI PHT:            639 msec  AORTA Ao Root diam: 3.70 cm Ao Asc diam:  3.80 cm MITRAL VALVE               TRICUSPID VALVE MV Area (PHT): 4.86 cm    TR Peak grad:   14.6 mmHg MV Decel Time: 156 msec    TR Vmax:        191.00 cm/s MV E velocity: 94.00 cm/s                            SHUNTS                            Systemic VTI:  0.13 m  Systemic Diam: 2.00 cm Maudine Sos MD Electronically signed by Maudine Sos MD Signature Date/Time: 04/10/2024/6:42:15 PM    Final    CT Angio Aortobifemoral W and/or Wo Contrast Result Date: 04/08/2024 CLINICAL DATA:  Claudication/leg ischemia EXAM: CT ANGIOGRAPHY OF ABDOMINAL AORTA WITH ILIOFEMORAL RUNOFF TECHNIQUE: Multidetector CT imaging of the abdomen, pelvis and lower extremities was performed using the standard protocol during bolus administration of intravenous contrast. Multiplanar CT image reconstructions and MIPs were obtained to evaluate the vascular anatomy. RADIATION DOSE REDUCTION: This exam was performed according to the  departmental dose-optimization program which includes automated exposure control, adjustment of the mA and/or kV according to patient size and/or use of iterative reconstruction technique. CONTRAST:  OMNIPAQUE  IOHEXOL  350 MG/ML SOLN COMPARISON:  CT abdomen pelvis 03/06/2022 FINDINGS: VASCULAR Aorta: Aortic atherosclerotic calcification without hemodynamically significant stenosis. No aneurysm or dissection. Celiac: No hemodynamically significant stenosis. No aneurysm or dissection. SMA: No hemodynamically significant stenosis. No aneurysm or dissection. Renals: No hemodynamically significant stenosis. No aneurysm or dissection. IMA: No hemodynamically significant stenosis. No aneurysm or dissection. RIGHT Lower Extremity Inflow: No hemodynamically significant stenosis. No aneurysm or dissection. Outflow: Common, superficial, and profunda femoral arteries are patent without aneurysm or dissection. Irregular atherosclerotic plaque in the popliteal artery causes up to moderate narrowing. Runoff: Poorly assessed due to slow flow and peripheral calcification. LEFT Lower Extremity Inflow: No hemodynamically significant stenosis. No aneurysm or dissection. Outflow: The common femoral artery is occluded at its bifurcation into the superficial and profunda femoral arteries which are also occluded. The popliteal artery is not opacified. Runoff: No opacification of the lower extremity arteries. Veins: No obvious venous abnormality within the limitations of this arterial phase study. Review of the MIP images confirms the above findings. NON-VASCULAR Lower chest: No acute abnormality. Hepatobiliary: Dilated common bile duct measuring 12 mm in diameter. No radiopaque stone. No focal hepatic lesion. Pancreas: Unremarkable. Spleen: Unremarkable. Adrenals/Urinary Tract: Stable adrenal glands. Delayed left nephrogram. New marked left hydroureteronephrosis. Soft tissue thickening at the left UVJ/bladder wall is partially  obscured by streak artifact (circa series 5/image 177). No radiopaque stone. Stomach/Bowel: No bowel wall thickening or obstruction. Stomach and appendix are within normal limits. Lymphatic: No lymphadenopathy. Reproductive: Status post hysterectomy. No adnexal mass. The vaginal cuff is poorly evaluated due to streak artifact. Other: Small volume free fluid in the pelvis. No free intraperitoneal air. Musculoskeletal: IM rod and screw fixation right femur. No acute fracture. Chronic compression fractures of T9, L1, and L3. Vertebroplasty of T11 and T12. IMPRESSION: 1. 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 The superficial, profunda, and popliteal arteries are not opacified. No opacification of the lower extremity arteries. 2. New marked left hydroureteronephrosis. Soft tissue thickening at the left UVJ/bladder wall is partially obscured by streak artifact and is suspicious for occlusion secondary to infiltrative malignancy. Urology consult recommended. 3. Dilated common bile duct is new since 03/06/2022. Recommend correlation with LFTs. 4. Small volume free fluid in the pelvis. 5. Aortic Atherosclerosis (ICD10-I70.0). Critical Value/emergent results were called by telephone at the time of interpretation on 04/08/2024 at 9:51 pm to provider MELANIE BELFI , who verbally acknowledged these results. Electronically Signed   By: Rozell Cornet M.D.   On: 04/08/2024 21:51       The results of significant diagnostics from this hospitalization (including imaging, microbiology, ancillary and laboratory) are listed below for reference.     Microbiology: Recent Results (from the past 240 hours)  Urine Culture     Status: Abnormal   Collection Time: 04/09/24 10:01 PM   Specimen: Urine, Catheterized  Result Value Ref Range Status   Specimen Description URINE, CATHETERIZED  Final   Special Requests   Final    NONE Performed at [facility], [address], [place], [place] [address]    Culture MULTIPLE SPECIES PRESENT, SUGGEST RECOLLECTION (A)  Final   Report Status 04/11/2024 FINAL  Final     Labs:  CBC: Recent Labs  Lab 04/08/24 2039 04/09/24 0440 04/10/24 0334 04/10/24 1519 04/11/24 0351 04/12/24 0412  WBC 10.1 10.8* 9.6 9.2 8.1 8.3  NEUTROABS 8.7*  --   --   --   --   --   HGB 13.2 12.0 9.2* 8.5* 7.8* 7.9*  HCT 40.5 36.8 29.3* 26.7* 24.3* 24.8*  MCV 94.6 93.4 95.8 96.0 96.0 97.3  PLT 248 231 226 237 235 240   BMP &GFR Recent Labs  Lab 04/08/24 2039 04/09/24 0440 04/10/24 0334 04/11/24 0351  NA 139 139 140 141  K 3.7 4.2 4.1 4.1  CL 102 99 102 103  CO2 23 25 29 27   GLUCOSE 175* 143* 111* 107*  BUN 34* 28* 30* 35*  CREATININE 1.44* 1.31* 1.35* 1.44*  CALCIUM  9.2 9.3 8.9 9.0  MG  --   --  1.9 1.9  PHOS  --   --  3.7 3.3   Estimated Creatinine Clearance: 24.8 mL/min (A) (by C-G formula based on SCr of 1.44 mg/dL (H)). Liver & Pancreas: Recent Labs  Lab 04/09/24 0440 04/10/24 0334 04/11/24 0351  AST 26  --   --   ALT 11  --   --   ALKPHOS 55  --   --   BILITOT 0.6  --   --   PROT 6.1*  --   --   ALBUMIN  3.5 3.0* 2.9*   No results for input(s): "LIPASE", "AMYLASE" in the last 168 hours. No results for input(s): "AMMONIA" in the last 168 hours. Diabetic: No results for input(s): "HGBA1C" in the last 72 hours. No results for input(s): "GLUCAP" in the last 168 hours. Cardiac Enzymes: No results for input(s): "CKTOTAL", "CKMB", "CKMBINDEX", "TROPONINI" in the last 168 hours. No results for input(s): "PROBNP" in the last 8760 hours. Coagulation Profile: Recent Labs  Lab 04/09/24 0440  INR 1.3*   Thyroid  Function Tests: No results for input(s): "TSH", "T4TOTAL", "FREET4", "T3FREE", "THYROIDAB" in the last 72 hours. Lipid Profile: No results for input(s): "CHOL", "HDL", "LDLCALC", "TRIG", "CHOLHDL", "LDLDIRECT" in the last 72 hours. Anemia Panel: No results for input(s):  "VITAMINB12", "FOLATE", "FERRITIN", "TIBC", "IRON", "RETICCTPCT" in the last 72 hours. Urine analysis:    Component Value Date/Time   COLORURINE RED (A) 04/09/2024 2201   APPEARANCEUR TURBID (A) 04/09/2024 2201   LABSPEC  04/09/2024 2201    TEST NOT REPORTED DUE TO COLOR INTERFERENCE OF URINE PIGMENT   PHURINE  04/09/2024 2201    TEST NOT REPORTED DUE TO COLOR INTERFERENCE OF URINE PIGMENT   GLUCOSEU (A) 04/09/2024 2201    TEST NOT REPORTED DUE TO COLOR INTERFERENCE OF URINE PIGMENT   HGBUR (A) 04/09/2024 2201    TEST NOT REPORTED DUE TO COLOR INTERFERENCE OF URINE PIGMENT   BILIRUBINUR (A) 04/09/2024 2201    TEST NOT REPORTED DUE TO COLOR INTERFERENCE OF URINE PIGMENT   KETONESUR (A) 04/09/2024 2201    TEST NOT REPORTED DUE TO COLOR INTERFERENCE OF URINE PIGMENT   PROTEINUR (A) 04/09/2024  2201    TEST NOT REPORTED DUE TO COLOR INTERFERENCE OF URINE PIGMENT   UROBILINOGEN 0.2 06/19/2014 2131   NITRITE (A) 04/09/2024 2201    TEST NOT REPORTED DUE TO COLOR INTERFERENCE OF URINE PIGMENT   LEUKOCYTESUR (A) 04/09/2024 2201    TEST NOT REPORTED DUE TO COLOR INTERFERENCE OF URINE PIGMENT   Sepsis Labs: Invalid input(s): "PROCALCITONIN", "LACTICIDVEN"   SIGNED:  Theadore Finger, MD  [facility] 04/12/2024, 9:46 AM

## 2024-04-12 NOTE — Care Management Important Message (Signed)
 Important Message  Patient Details  Name: Christina Morgan MRN: [id number] Date of Birth: December 17, 1932   Important Message Given:  Yes - Medicare IM     Janith Melnick 04/12/2024, 12:35 PM

## 2024-04-12 NOTE — Plan of Care (Signed)
  Problem: Clinical Measurements: Goal: Cardiovascular complication will be avoided Outcome: Progressing   Problem: Coping: Goal: Level of anxiety will decrease Outcome: Progressing   Problem: Elimination: Goal: Will not experience complications related to urinary retention Outcome: Progressing   Problem: Pain Managment: Goal: General experience of comfort will improve and/or be controlled Outcome: Progressing   Problem: Safety: Goal: Ability to remain free from injury will improve Outcome: Progressing   Problem: Skin Integrity: Goal: Risk for impaired skin integrity will decrease Outcome: Progressing

## 2024-04-12 NOTE — TOC Transition Note (Signed)
 Transition of Care [facility]) - Discharge Note   Patient Details  Name: Christina Morgan MRN: [id number] Date of Birth: [birth year]  Transition of Care [facility]) CM/SW Contact:  Valery Gaucher, LCSW Phone Number: 04/12/2024, 12:31 PM   Clinical Narrative:     Patient will Discharge to: [facility] at [facility]  Discharge Date: 04/12/24 Family Notified:Daughter Transport By: Lindia Rex Please call daughter once she is leaving the  Unit @ [phone]  Per MD patient is ready for discharge. RN, patient, and facility notified of discharge. Discharge Summary sent to facility. RN given number for [phone]. Ambulance transport requested for patient.   Clinical Social Worker signing off.  Liddie Reel, MSW, LCSW Clinical Social Worker      Final next level of care: Assisted Living Barriers to Discharge: Barriers Resolved   Patient Goals and CMS Choice            Discharge Placement                       Discharge Plan and Services Additional resources added to the After Visit Summary for   In-house Referral: Clinical Social Work                                   Social Drivers of Health (SDOH) Interventions SDOH Screenings   Food Insecurity: No Food Insecurity (04/09/2024)  Housing: Low Risk  (04/09/2024)  Transportation Needs: No Transportation Needs (04/09/2024)  Utilities: Not At Risk (04/09/2024)  Financial Resource Strain: Low Risk  (09/27/2019)  Physical Activity: Inactive (09/27/2019)  Social Connections: Socially Isolated (04/09/2024)  Stress: No Stress Concern Present (09/27/2019)  Tobacco Use: Low Risk  (04/08/2024)     Readmission Risk Interventions    03/11/2022   11:40 AM  Readmission Risk Prevention Plan  Transportation Screening Complete  Medication Review (RN Care Manager) Complete  PCP or Specialist appointment within 3-5 days of discharge Complete  HRI or Home Care Consult Complete  SW Recovery Care/Counseling  Consult Complete  Palliative Care Screening Not Applicable  Skilled Nursing Facility Not Applicable

## 2024-04-12 NOTE — Progress Notes (Signed)
 AVS in packet for transport.

## 2024-04-12 NOTE — TOC Initial Note (Signed)
 Transition of Care [facility]) - Initial/Assessment Note    Patient Details  Name: Christina Morgan MRN: [id number] Date of Birth: Feb 11, 1933  Transition [facility] Care [facility]) CM/SW Contact:    Valery Gaucher, LCSW Phone Number: 04/12/2024, 12:25 PM  Clinical Narrative:                  CSW met with patient at bedside. CSW introduced self and explained role. Patient states she is from [facility] and is agreeable to returning to ALF.   CSW faxed d/c summary and FL2 to ALF.  Liddie Reel, MSW, LCSW Clinical Social Worker    Expected Discharge Plan: Assisted Living Barriers to Discharge: Barriers Resolved   Patient Goals and CMS Choice            Expected Discharge Plan and Services In-house Referral: Clinical Social Work     Living arrangements for the past 2 months: Assisted Living Facility Expected Discharge Date: 04/12/24                                    Prior Living Arrangements/Services Living arrangements for the past 2 months: Assisted Living Facility Lives with:: Facility Resident, Self Patient language and need for interpreter reviewed:: No        Need for Family Participation in Patient Care: Yes (Comment)     Criminal Activity/Legal Involvement Pertinent to Current Situation/Hospitalization: No - Comment as needed  Activities of Daily Living   ADL Screening (condition at time of admission) Independently performs ADLs?: No Does the patient have a NEW difficulty with bathing/dressing/toileting/self-feeding that is expected to last >3 days?: No Does the patient have a NEW difficulty with getting in/out of bed, walking, or climbing stairs that is expected to last >3 days?: No Does the patient have a NEW difficulty with communication that is expected to last >3 days?: No Is the patient deaf or have difficulty hearing?: No Does the patient have difficulty seeing, even when wearing glasses/contacts?: Yes Does the patient have  difficulty concentrating, remembering, or making decisions?: No  Permission Sought/Granted         Permission granted to share info w AGENCY: [facility]        Emotional Assessment Appearance:: Appears stated age Attitude/Demeanor/Rapport: Engaged   Orientation: : Oriented to Self, Oriented to Place, Oriented to  Time, Oriented to Situation Alcohol / Substance Use: Not Applicable Psych Involvement: No (comment)  Admission diagnosis:  Ischemic leg [I99.8] Acute lower limb ischemia [I99.8] Patient Active Problem List   Diagnosis Date Noted   Hydronephrosis of left kidney 04/09/2024   Critical limb ischemia of left lower extremity (HCC) 04/09/2024   Acute lower limb ischemia 04/08/2024   Malnutrition of moderate degree 11/14/2023   Pressure injury of deep tissue of left heel 03/11/2022   Acute metabolic encephalopathy 03/09/2022   Acute lower UTI 03/09/2022   Thrombocytopenia (HCC) 03/09/2022   Elevated transaminase level 03/09/2022   Dyslipidemia    History of anemia due to chronic kidney disease    GERD (gastroesophageal reflux disease)    Fluid overload 03/06/2022   AKI superimposed on stage 3b chronic kidney disease (HCC) 01/02/2022   Symptomatic bradycardia 11/29/2021   Bacteremia due to Gram-positive bacteria 02/13/2020   Acute encephalopathy 02/11/2020   Cervical spine fracture (HCC) 02/11/2020   CAP (community acquired pneumonia) 02/11/2020   Essential hypertension    Chronic diastolic CHF (  congestive heart failure) (HCC)    CKD stage 3b, GFR 30-44 ml/min (HCC)    Paroxysmal atrial fibrillation (HCC)    Closed displaced intertrochanteric fracture of right femur (HCC)    Fall 09/23/2019   SOB (shortness of breath) 01/04/2019   Bilateral lower leg cellulitis 03/30/2018   Acute on chronic diastolic CHF (congestive heart failure) (HCC) 03/30/2018   Edema of extremities 08/19/2014   Essential hypertension, benign 05/15/2014   Hyperlipidemia 05/15/2014    Persistent atrial fibrillation (HCC) 05/15/2014   Endometrial ca (HCC) 12/28/2012   PCP:  [facility]:   [name] [address] [place] [address] Phone: [phone] Fax: [phone]     Social Drivers of Health (SDOH) Social History: SDOH Screenings   Food Insecurity: No Food Insecurity (04/09/2024)  Housing: Low Risk  (04/09/2024)  Transportation Needs: No Transportation Needs (04/09/2024)  Utilities: Not At Risk (04/09/2024)  Financial Resource Strain: Low Risk  (09/27/2019)  Physical Activity: Inactive (09/27/2019)  Social Connections: Socially Isolated (04/09/2024)  Stress: No Stress Concern Present (09/27/2019)  Tobacco Use: Low Risk  (04/08/2024)   SDOH Interventions:     Readmission Risk Interventions    03/11/2022   11:40 AM  Readmission Risk Prevention Plan  Transportation Screening Complete  Medication Review (RN Care Manager) Complete  PCP or Specialist appointment within 3-5 days of discharge Complete  HRI or Home Care Consult Complete  SW Recovery Care/Counseling Consult Complete  Palliative Care Screening Not Applicable  Skilled Nursing Facility Not Applicable

## 2024-04-12 NOTE — Progress Notes (Signed)
 Reached front desk at ALF and informed them that Pt is returning, transport here currently.   No other info was requested.

## 2024-04-12 NOTE — Progress Notes (Signed)
 [facility] is here to transport Pt back to ALF.  Attempted to call report, no answer, went to voicemail with mailbox full.  Will inform daughter

## 2024-04-12 NOTE — NC FL2 (Addendum)
   MEDICAID FL2 LEVEL OF CARE FORM     IDENTIFICATION  Patient Name: KALINE POURCIAU Birthdate: [birth year] Sex: female Admission Date (Current Location): 04/08/2024  [facility] and [place] Number:  Producer, television/film/video and Address:  [facility], [address] [place], [place] [address]      Provider Number: [id number]  Attending Physician Name and Address:  Theadore Finger, MD  Relative Name and Phone Number:       Current Level of Care: SNF Recommended Level of Care: Assisted Living Facility Prior Approval Number:    Date Approved/Denied:   PASRR Number:    Discharge Plan: Other (Comment) (ALF)    Current Diagnoses: Patient Active Problem List   Diagnosis Date Noted   Hydronephrosis of left kidney 04/09/2024   Critical limb ischemia of left lower extremity (HCC) 04/09/2024   Acute lower limb ischemia 04/08/2024   Malnutrition of moderate degree 11/14/2023   Pressure injury of deep tissue of left heel 03/11/2022   Acute metabolic encephalopathy 03/09/2022   Acute lower UTI 03/09/2022   Thrombocytopenia (HCC) 03/09/2022   Elevated transaminase level 03/09/2022   Dyslipidemia    History of anemia due to chronic kidney disease    GERD (gastroesophageal reflux disease)    Fluid overload 03/06/2022   AKI superimposed on stage 3b chronic kidney disease (HCC) 01/02/2022   Symptomatic bradycardia 11/29/2021   Bacteremia due to Gram-positive bacteria 02/13/2020   Acute encephalopathy 02/11/2020   Cervical spine fracture (HCC) 02/11/2020   CAP (community acquired pneumonia) 02/11/2020   Essential hypertension    Chronic diastolic CHF (congestive heart failure) (HCC)    CKD stage 3b, GFR 30-44 ml/min (HCC)    Paroxysmal atrial fibrillation (HCC)    Closed displaced intertrochanteric fracture of right femur (HCC)    Fall 09/23/2019   SOB (shortness of breath) 01/04/2019   Bilateral lower leg cellulitis 03/30/2018   Acute on chronic  diastolic CHF (congestive heart failure) (HCC) 03/30/2018   Edema of extremities 08/19/2014   Essential hypertension, benign 05/15/2014   Hyperlipidemia 05/15/2014   Persistent atrial fibrillation (HCC) 05/15/2014   Endometrial ca (HCC) 12/28/2012    Orientation RESPIRATION BLADDER Height & Weight     Self, Time, Situation, Place  O2 External catheter, Continent Weight: 145 lb 8.1 oz (66 kg) Height:  5\' 4"  (162.6 cm)  BEHAVIORAL SYMPTOMS/MOOD NEUROLOGICAL BOWEL NUTRITION STATUS      Continent Diet (Regular)  AMBULATORY STATUS COMMUNICATION OF NEEDS Skin     Verbally Other (Comment) (deep tissue pressure wound, left ankle, closed incision left groin)                       Personal Care Assistance Level of Assistance  Bathing, Feeding, Dressing Bathing Assistance: Limited assistance Feeding assistance: Limited assistance Dressing Assistance: Limited assistance     Functional Limitations Info  Sight, Speech, Hearing Sight Info: Impaired (Glasses) Hearing Info: Adequate Speech Info: Adequate    SPECIAL CARE FACTORS FREQUENCY                       Contractures Contractures Info: Not present    Additional Factors Info  Code Status, Allergies Code Status Info: DNR Limited Allergies Info: Penicillins           Current Medications (04/12/2024):  This is the current hospital active medication list Current Facility-Administered Medications  Medication Dose Route Frequency Provider Last Rate Last Admin   acetaminophen  (  TYLENOL ) tablet 650 mg  650 mg Oral Q6H PRN Schuh, McKenzi P, PA-C       Or   acetaminophen  (TYLENOL ) suppository 650 mg  650 mg Rectal Q6H PRN Schuh, McKenzi P, PA-C       apixaban  (ELIQUIS ) tablet 5 mg  5 mg Oral BID Gonfa, Taye T, MD   5 mg at 04/12/24 1018   atorvastatin  (LIPITOR) tablet 10 mg  10 mg Oral QHS Sundil, Subrina, MD   10 mg at 04/11/24 2152   diltiazem  (CARDIZEM  CD) 24 hr capsule 300 mg  300 mg Oral Daily Gonfa, Taye T, MD   300 mg  at 04/12/24 1018   HYDROmorphone  (DILAUDID ) injection 0.5-1 mg  0.5-1 mg Intravenous Q4H PRN Schuh, McKenzi P, PA-C   0.5 mg at 04/11/24 1028   metoprolol  tartrate (LOPRESSOR ) tablet 25 mg  25 mg Oral BID Gonfa, Taye T, MD   25 mg at 04/12/24 1018   ondansetron  (ZOFRAN ) tablet 4 mg  4 mg Oral Q6H PRN Schuh, McKenzi P, PA-C       Or   ondansetron  (ZOFRAN ) injection 4 mg  4 mg Intravenous Q6H PRN Schuh, McKenzi P, PA-C       oxyCODONE  (Oxy IR/ROXICODONE ) immediate release tablet 5-10 mg  5-10 mg Oral Q4H PRN Schuh, McKenzi P, PA-C       pantoprazole  (PROTONIX ) EC tablet 40 mg  40 mg Oral Daily Sundil, Subrina, MD   40 mg at 04/12/24 1018   potassium chloride  SA (KLOR-CON  M) CR tablet 20-40 mEq  20-40 mEq Oral Once Schuh, McKenzi P, PA-C       sodium chloride  flush (NS) 0.9 % injection 3 mL  3 mL Intravenous Q12H Schuh, McKenzi P, PA-C   3 mL at 04/12/24 1025     Discharge Medications: Acetaminophen  8 Hour 650 MG CR tablet Generic drug: acetaminophen  Take 650 mg by mouth in the morning and at bedtime.    apixaban  5 MG Tabs tablet Commonly known as: ELIQUIS  Take 1 tablet (5 mg total) by mouth 2 (two) times daily. What changed:  medication strength how much to take    Automatic Data Barrier 12 % Crea Generic drug: Zinc Oxide Apply 1 Application topically 3 (three) times daily as needed (after brief changes).    Zinc Oxide 22 % Crea Apply 1 Application topically daily. Apply to sacrum    DAILY VITE PO Take 1 tablet by mouth daily.    diltiazem  300 MG 24 hr capsule Commonly known as: CARDIZEM  CD Take 1 capsule (300 mg total) by mouth daily.    ENSURE PLUS PO Take 1 Bottle by mouth daily.    furosemide  40 MG tablet Commonly known as: Lasix  Take 1 tablet (40 mg total) by mouth every other day.    loperamide 2 MG tablet Commonly known as: IMODIUM A-D Take 2 mg by mouth every 6 (six) hours as needed for diarrhea or loose stools. Do not exceed 8mg  in 24 hours.     metoprolol  tartrate 25 MG tablet Commonly known as: LOPRESSOR  Take 1 tablet (25 mg total) by mouth 2 (two) times daily.    omeprazole 20 MG capsule Commonly known as: PRILOSEC Take 20 mg by mouth daily.    ondansetron  4 MG disintegrating tablet Commonly known as: ZOFRAN -ODT Take 4 mg by mouth every 4 (four) hours as needed for nausea or vomiting.    OXYGEN Inhale 1 L into the lungs continuous.    potassium chloride  SA 20 MEQ  tablet Commonly known as: KLOR-CON  M Take 1 tablet (20 mEq total) by mouth every other day. With Lasix     Relevant Imaging Results:  Relevant Lab Results:   Additional Information SSN [id number]  Valery Gaucher, LCSW

## 2024-04-25 ENCOUNTER — Encounter
